# Patient Record
Sex: Male | Born: 1965
Health system: Southern US, Community
[De-identification: ages and names within clinical notes are randomized; demographics above are authoritative.]

## PROBLEM LIST (undated history)

## (undated) DIAGNOSIS — Z9289 Personal history of other medical treatment: Secondary | ICD-10-CM

## (undated) DIAGNOSIS — R110 Nausea: Secondary | ICD-10-CM

## (undated) DIAGNOSIS — R112 Nausea with vomiting, unspecified: Secondary | ICD-10-CM

## (undated) DIAGNOSIS — E7800 Pure hypercholesterolemia, unspecified: Secondary | ICD-10-CM

## (undated) DIAGNOSIS — K279 Peptic ulcer, site unspecified, unspecified as acute or chronic, without hemorrhage or perforation: Secondary | ICD-10-CM

## (undated) DIAGNOSIS — K635 Polyp of colon: Secondary | ICD-10-CM

## (undated) DIAGNOSIS — H8093 Unspecified otosclerosis, bilateral: Secondary | ICD-10-CM

## (undated) DIAGNOSIS — M549 Dorsalgia, unspecified: Secondary | ICD-10-CM

## (undated) DIAGNOSIS — N200 Calculus of kidney: Secondary | ICD-10-CM

## (undated) DIAGNOSIS — E785 Hyperlipidemia, unspecified: Secondary | ICD-10-CM

## (undated) DIAGNOSIS — J189 Pneumonia, unspecified organism: Secondary | ICD-10-CM

## (undated) DIAGNOSIS — Z789 Other specified health status: Secondary | ICD-10-CM

## (undated) DIAGNOSIS — Z9889 Other specified postprocedural states: Secondary | ICD-10-CM

## (undated) DIAGNOSIS — G4730 Sleep apnea, unspecified: Secondary | ICD-10-CM

## (undated) DIAGNOSIS — N183 Chronic kidney disease, stage 3 unspecified: Secondary | ICD-10-CM

## (undated) DIAGNOSIS — I10 Essential (primary) hypertension: Secondary | ICD-10-CM

## (undated) DIAGNOSIS — D649 Anemia, unspecified: Secondary | ICD-10-CM

## (undated) HISTORY — DX: Other specified health status: Z78.9

## (undated) HISTORY — DX: Calculus of kidney: N20.0

## (undated) HISTORY — PX: SMALL INTESTINE SURGERY: SHX150

## (undated) HISTORY — DX: Peptic ulcer, site unspecified, unspecified as acute or chronic, without hemorrhage or perforation: K27.9

## (undated) HISTORY — DX: Unspecified otosclerosis, bilateral: H80.93

## (undated) HISTORY — DX: Chronic kidney disease, stage 3 unspecified: N18.30

## (undated) HISTORY — DX: Personal history of other medical treatment: Z92.89

## (undated) HISTORY — DX: Polyp of colon: K63.5

## (undated) HISTORY — DX: Sleep apnea, unspecified: G47.30

---

## 1988-02-15 HISTORY — PX: OTHER SURGICAL HISTORY: SHX169

## 2003-02-15 HISTORY — PX: STAPEDOTOMY: SHX2437

## 2003-02-28 ENCOUNTER — Encounter (INDEPENDENT_AMBULATORY_CARE_PROVIDER_SITE_OTHER): Payer: Self-pay | Admitting: Specialist

## 2003-02-28 ENCOUNTER — Ambulatory Visit (HOSPITAL_BASED_OUTPATIENT_CLINIC_OR_DEPARTMENT_OTHER): Admission: RE | Admit: 2003-02-28 | Discharge: 2003-02-28 | Payer: Self-pay | Admitting: Otolaryngology

## 2003-02-28 ENCOUNTER — Ambulatory Visit (HOSPITAL_COMMUNITY): Admission: RE | Admit: 2003-02-28 | Discharge: 2003-02-28 | Payer: Self-pay | Admitting: Otolaryngology

## 2003-10-31 ENCOUNTER — Ambulatory Visit (HOSPITAL_BASED_OUTPATIENT_CLINIC_OR_DEPARTMENT_OTHER): Admission: RE | Admit: 2003-10-31 | Discharge: 2003-10-31 | Payer: Self-pay | Admitting: Otolaryngology

## 2003-10-31 ENCOUNTER — Encounter (INDEPENDENT_AMBULATORY_CARE_PROVIDER_SITE_OTHER): Payer: Self-pay | Admitting: Specialist

## 2003-10-31 ENCOUNTER — Ambulatory Visit (HOSPITAL_COMMUNITY): Admission: RE | Admit: 2003-10-31 | Discharge: 2003-10-31 | Payer: Self-pay | Admitting: Otolaryngology

## 2005-02-14 DIAGNOSIS — N200 Calculus of kidney: Secondary | ICD-10-CM

## 2005-02-14 HISTORY — DX: Calculus of kidney: N20.0

## 2006-01-24 ENCOUNTER — Ambulatory Visit (HOSPITAL_COMMUNITY): Admission: RE | Admit: 2006-01-24 | Discharge: 2006-01-24 | Payer: Self-pay | Admitting: Urology

## 2008-02-22 ENCOUNTER — Encounter: Admission: RE | Admit: 2008-02-22 | Discharge: 2008-02-22 | Payer: Self-pay | Admitting: Internal Medicine

## 2009-03-03 ENCOUNTER — Ambulatory Visit: Payer: Self-pay | Admitting: Psychology

## 2010-04-09 ENCOUNTER — Inpatient Hospital Stay (HOSPITAL_COMMUNITY)
Admission: EM | Admit: 2010-04-09 | Discharge: 2010-04-12 | DRG: 378 | Disposition: A | Discharge status: Home / Self Care | Payer: Managed Care, Other (non HMO) | Attending: Family Medicine | Admitting: Family Medicine

## 2010-04-09 ENCOUNTER — Emergency Department (HOSPITAL_COMMUNITY): Payer: Managed Care, Other (non HMO)

## 2010-04-09 DIAGNOSIS — I10 Essential (primary) hypertension: Secondary | ICD-10-CM

## 2010-04-09 DIAGNOSIS — E785 Hyperlipidemia, unspecified: Secondary | ICD-10-CM | POA: Diagnosis present

## 2010-04-09 DIAGNOSIS — K2970 Gastritis, unspecified, without bleeding: Secondary | ICD-10-CM | POA: Diagnosis present

## 2010-04-09 DIAGNOSIS — D62 Acute posthemorrhagic anemia: Secondary | ICD-10-CM | POA: Diagnosis present

## 2010-04-09 DIAGNOSIS — D126 Benign neoplasm of colon, unspecified: Secondary | ICD-10-CM | POA: Diagnosis present

## 2010-04-09 DIAGNOSIS — K260 Acute duodenal ulcer with hemorrhage: Secondary | ICD-10-CM

## 2010-04-09 DIAGNOSIS — K264 Chronic or unspecified duodenal ulcer with hemorrhage: Principal | ICD-10-CM | POA: Diagnosis present

## 2010-04-09 LAB — DIFFERENTIAL
Basophils Relative: 0 % (ref 0–1)
Eosinophils Absolute: 0.1 10*3/uL (ref 0.0–0.7)
Lymphs Abs: 3 10*3/uL (ref 0.7–4.0)
Neutro Abs: 5.9 10*3/uL (ref 1.7–7.7)

## 2010-04-09 LAB — COMPREHENSIVE METABOLIC PANEL
ALT: 17 U/L (ref 0–53)
AST: 16 U/L (ref 0–37)
Albumin: 3.1 g/dL — ABNORMAL LOW (ref 3.5–5.2)
CO2: 25 mEq/L (ref 19–32)
Chloride: 109 mEq/L (ref 96–112)
Creatinine, Ser: 1.27 mg/dL (ref 0.4–1.5)

## 2010-04-09 LAB — CBC
Hemoglobin: 8.2 g/dL — ABNORMAL LOW (ref 13.0–17.0)
MCH: 29.8 pg (ref 26.0–34.0)
MCHC: 35 g/dL (ref 30.0–36.0)
MCV: 85.1 fL (ref 78.0–100.0)
RDW: 13.7 % (ref 11.5–15.5)
WBC: 9.9 10*3/uL (ref 4.0–10.5)

## 2010-04-09 LAB — URINALYSIS, ROUTINE W REFLEX MICROSCOPIC
Ketones, ur: NEGATIVE mg/dL
Urobilinogen, UA: 0.2 mg/dL (ref 0.0–1.0)

## 2010-04-09 LAB — URINE MICROSCOPIC-ADD ON

## 2010-04-09 LAB — LIPASE, BLOOD: Lipase: 38 U/L (ref 11–59)

## 2010-04-09 LAB — OCCULT BLOOD, POC DEVICE: Fecal Occult Bld: POSITIVE

## 2010-04-09 MED ORDER — IOHEXOL 300 MG/ML  SOLN
80.0000 mL | Freq: Once | INTRAMUSCULAR | Status: AC | PRN
  Administered 2010-04-09: 80 mL via INTRAVENOUS

## 2010-04-10 ENCOUNTER — Encounter: Payer: Self-pay | Admitting: Family Medicine

## 2010-04-10 ENCOUNTER — Other Ambulatory Visit: Payer: Self-pay | Admitting: Gastroenterology

## 2010-04-10 DIAGNOSIS — E785 Hyperlipidemia, unspecified: Secondary | ICD-10-CM | POA: Diagnosis present

## 2010-04-10 DIAGNOSIS — I10 Essential (primary) hypertension: Secondary | ICD-10-CM | POA: Diagnosis present

## 2010-04-10 LAB — FOLATE: Folate: >20 ng/mL

## 2010-04-10 LAB — CBC
HCT: 22.2 % — ABNORMAL LOW (ref 39.0–52.0)
MCH: 29.2 pg (ref 26.0–34.0)
MCH: 29.2 pg (ref 26.0–34.0)
MCHC: 34.2 g/dL (ref 30.0–36.0)
MCV: 85.4 fL (ref 78.0–100.0)
RBC: 2.7 MIL/uL — ABNORMAL LOW (ref 4.22–5.81)
RDW: 14 % (ref 11.5–15.5)
RDW: 14.1 % (ref 11.5–15.5)
RDW: 14.4 % (ref 11.5–15.5)
WBC: 6.1 10*3/uL (ref 4.0–10.5)
WBC: 6.7 10*3/uL (ref 4.0–10.5)

## 2010-04-10 LAB — ABO/RH: ABO/RH(D): O POS

## 2010-04-10 LAB — BASIC METABOLIC PANEL
CO2: 25 mEq/L (ref 19–32)
Chloride: 111 mEq/L (ref 96–112)
GFR calc Af Amer: >60 mL/min (ref >60)
Glucose, Bld: 99 mg/dL (ref 70–99)
Potassium: 3.7 mEq/L (ref 3.5–5.1)

## 2010-04-10 LAB — IRON AND TIBC
Saturation Ratios: 14 % — ABNORMAL LOW (ref 20–55)
TIBC: 249 ug/dL (ref 215–435)
UIBC: 213 ug/dL

## 2010-04-10 LAB — VITAMIN B12: Vitamin B-12: 334 pg/mL (ref 211–911)

## 2010-04-10 LAB — TYPE AND SCREEN

## 2010-04-10 NOTE — H&P (Signed)
[facility] Admission History and Physical  Patient name: [name] Medical record number: [id number] Date of birth: 1965/04/16 Age: 45 y.o. Gender: male  Primary Care Provider: [place]  Chief Complaint: Fever, severe headache, black stools History of Present Illness: Black stools for about 5 days, fever and headache x 3 days.  Has chronic headaches, 2-3 times per month, this one is similar to  Has had fever at work, but does not know how high.  Was taken by nurse and told to come to hospital for "high temperature."   Has also noticed relatively sudden onset of black stools for past five days.  No changes in diet, no alcohol, no significant NSAIDs.  No n/v/d.  Does have a past history of stomach ulcers for which he has had surgery.  Has recently been given a medicine for headache but doesn't know what it is.  Patient Active Problem List  Diagnoses  . HTN (hypertension)  . HLD (hyperlipidemia)   Past Medical History: Past Medical History  Diagnosis Date  . Kidney stones 2007  . PUD (peptic ulcer disease)     Has had unspecified surgery for this    Past Surgical History: Past Surgical History  Procedure Date  . Surgery for ulcers 1990    Social History: History   Social History  . Marital Status: Single    Spouse Name: N/A    Number of Children: N/A  . Years of Education: N/A   Social History Main Topics  . Smoking status: Never Smoker   . Smokeless tobacco: Never Used  . Alcohol Use: No  . Drug Use: No  . Sexually Active: Yes   Other Topics Concern  . Not on file   Social History Narrative   Lives at home with wife and family.    Family History: Family History  Problem Relation Age of Onset  . Hyperlipidemia Father     Allergies: No Known Allergies  Pt reports having stopped his HCTZ and simvastatin about a month ago because they made his headaches worse.  No current facility-administered medications for this visit.    Current Outpatient Prescriptions  Medication Sig Dispense Refill  . DISCONTD: hydrochlorothiazide 25 MG tablet Take 25 mg by mouth daily.        Facility-Administered Medications Ordered in Other Visits  Medication Dose Route Frequency Provider Last Rate Last Dose  . iohexol (OMNIPAQUE) 300 MG/ML injection 80 mL  80 mL Intravenous Once PRN Medication Radiologist   80 mL at 04/09/10 2332   Review Of Systems: Per HPI with the following additions: Positive for fatigue, no n/v/d, no dysuria/hematuria, no constipation/diarrhea. Otherwise 12 point review of systems was performed and was unremarkable.  Physical Exam: Pulse: 79 Blood Pressure: 116/77 RR: 18  O2: 100%RA  Temp: 97.6 General: NAD, interactive and appropriate HEENT: PERRL, EOMI, pharynx non-erythematous, MMM Heart: regular rate, no murmurs Lungs: Normal respiratory effort. Lungs CTABL, no crackles or wheezes. Abdomen: SNTND, no guarding  Back: Anus has no obvious deformaties.  No gross blood, no external hemrhoids.  Rectal deferred. Extremities: negative edema, ROM grossly preserved Skin: No sores or suspicious lesions or rashes or color changes Neurology: Alert and oriented, CN II-XII grossly intact  Labs and Imaging: CBC: 9.9>8.2<159 BMET: 141/3.6/109/25/29/1.27<101 UA: moderate leukocyte esterase, moderate mbg, 30 protein Urine micro: 7-10 WBC, 11-20RBC, rare bacteria CXR:No acute cardiopulmonary disease. CT Ab/Pelvis: IMPRESSION:  1. No acute process in the abdomen or pelvis.  2. Similar configuration of massive right renal  enlargement,  favored to represents a markedly dilated collecting system  secondary to chronic UPJ obstruction. Stones are identified within  this hydronephrotic system.  3. Probable distal esophagitis.   Assessment and Plan: 45 year old male with GI bleed and headache and history of fever. 1. GI Bleed: As the patient has had black stools for about five days do not feel that bleed is  large-volume.  This is supported by the lack of diarrhea.  As the patient has a previous history of ulcers requiring surgery, would not be surprised if the cause of his heme-positive stools were upper GI.  Will monitor CBCs, place on high dose PPI, and monitor.  Will likely consult GI in the morning as the patient would likely benefit from upper endoscopy and, if nonrevealing, possibly a colonoscopy as well. 2. Headache: Will provide tylenol and tramadol for headache.  As this is a chronic issue and his current headache is similar to previous headaches do not feel that any additional workup is necessary. 3. Fever: Pt reports fever per nurse at work but does not know how high.  Will monitor closely and will culture the patient's urine (due to his chronic hydronephrosis) but, considering that the patient is afebrile at this time with no CVA tenderness or abdominal pain, do not fee there is any reason to start antibiotics. 4. FEN/GI: Clears for now, pending possible EGD in AM 5. Prophylaxis: SCDs, high dose PPI 6. Disposition: Pending improvement

## 2010-04-11 LAB — CBC
HCT: 21.4 % — ABNORMAL LOW (ref 39.0–52.0)
HCT: 24.4 % — ABNORMAL LOW (ref 39.0–52.0)
HCT: 24.7 % — ABNORMAL LOW (ref 39.0–52.0)
Hemoglobin: 8.1 g/dL — ABNORMAL LOW (ref 13.0–17.0)
Hemoglobin: 8.2 g/dL — ABNORMAL LOW (ref 13.0–17.0)
MCH: 29.6 pg (ref 26.0–34.0)
MCH: 28.8 pg (ref 26.0–34.0)
MCHC: 34.1 g/dL (ref 30.0–36.0)
MCV: 86.8 fL (ref 78.0–100.0)
Platelets: 134 10*3/uL — ABNORMAL LOW (ref 150–400)
RBC: 2.81 MIL/uL — ABNORMAL LOW (ref 4.22–5.81)
RBC: 2.82 MIL/uL — ABNORMAL LOW (ref 4.22–5.81)
WBC: 6.3 10*3/uL (ref 4.0–10.5)

## 2010-04-12 ENCOUNTER — Other Ambulatory Visit: Payer: Self-pay | Admitting: Gastroenterology

## 2010-04-12 LAB — DIFFERENTIAL
Basophils Relative: 0 % (ref 0–1)
Lymphocytes Relative: 32 % (ref 12–46)
Monocytes Absolute: 0.5 10*3/uL (ref 0.1–1.0)
Monocytes Relative: 9 % (ref 3–12)
Neutro Abs: 2.8 10*3/uL (ref 1.7–7.7)
Neutrophils Relative %: 56 % (ref 43–77)

## 2010-04-12 LAB — CBC
HCT: 22.4 % — ABNORMAL LOW (ref 39.0–52.0)
Hemoglobin: 7.5 g/dL — ABNORMAL LOW (ref 13.0–17.0)
MCH: 29.5 pg (ref 26.0–34.0)
MCHC: 33.5 g/dL (ref 30.0–36.0)
RBC: 2.54 MIL/uL — ABNORMAL LOW (ref 4.22–5.81)

## 2010-04-12 LAB — URINE CULTURE: Colony Count: 8000

## 2010-04-14 NOTE — Consult Note (Signed)
  NAMEIRISH, GONYO                  ACCOUNT NO.:  [id number]  MEDICAL RECORD NO.:  [id number]           PATIENT TYPE:  E  LOCATION:  [facility]                         FACILITY:  [facility]  PHYSICIAN:  Arta Silence, MD     DATE OF BIRTH:  Apr 05, 1965  DATE OF CONSULTATION:  04/10/2010 DATE OF DISCHARGE:                                CONSULTATION   REASON FOR CONSULTATION:  Melena.  CHIEF COMPLAINT:  Black stool.  HISTORY OF PRESENT ILLNESS:  Mr. Hull is a 45 year old emigrant from [place] who presents for evaluation of blood in the stool for the past 5 days, in the setting of some subjective fevers, he has had a couple of black bowel movements per day.  This has been complicated by some subjective fatigue and malaise.  He has no abdominal pain, hematemesis, dysphagia, odynophagia, change in bowel habits, loss of appetite or weight loss.  He denies having had a colonoscopy.  He did have some type of gastric procedure in the [place] for ulcer disease.  He does endorse taking large amounts of NSAIDs for his headaches.  Past medical history, past surgical history, medications, allergies, family history, and social history all from dictated note from Dr. Andria Frames dictated April 10, 2010.  I have reviewed and I agree.  PHYSICAL EXAMINATION:  VITAL SIGNS:  Blood pressure is 116/77, heart rate 79, respiratory rate 18, temperature 97.9, oxygen saturation 100% on room air. GENERAL:  Mr. Franchetti is in no acute distress. ENT:  Slightly dry mucous membranes.  He has no oropharyngeal lesions. NECK:  Supple without thyromegaly, lymphadenopathy, or bruits. SKIN:  No obvious rash or ecchymoses. LYMPHATICS:  No palpable axillary, submandibular, or supraclavicular adenopathy. NEUROLOGIC:  Diffusely weak, but nonfocal, without lateralizing signs. PSYCHIATRIC:  Normal mood and affect. HEART:  Regular rhythm, normal rate without murmur, rub, or gallop. LUNGS:  Clear to auscultation. ABDOMEN:  Soft,  nontender, nondistended.  Well-healed midline scar. EXTREMITIES:  No peripheral cyanosis, clubbing, or edema.  CT scan of the abdomen and pelvis noted possible esophagitis, chronic ureteropelvic junction obstruction with stones.  LABORATORY STUDIES:  Hemoglobin on admission was 7.6, white count 6.1, platelet count 137.  Sodium 142, potassium 3.7, chloride 111, bicarb 25, BUN 18, creatinine 1.0, total protein is 5.0, albumin 3.1, total bilirubin 0.3, alk phos 37, AST 16, ALT 17.  Iron studies show borderline iron deficiency, but normal ferritin, B12, and folate.  IMPRESSION:  Mr. Dollman is a 45 year old gentleman presenting for evaluation of melena and acute blood loss anemia.  He is hemodynamically stable.  He does use a lot of NSAIDs.  PLAN: 1. Agree with supportive management with IV fluids and PPI therapy,     and serial CBCs. 2. We will proceed with endoscopy.  Thanks again for allowing [place] to participate in Mr. Modisette's care.     Arta Silence, MD     WO/MEDQ  D:  04/10/2010  T:  04/10/2010  Job:  [id number]  Electronically Signed by Arta Silence  on 04/14/2010 05:43:28 PM

## 2010-04-26 NOTE — H&P (Signed)
NAMEMARQUAVION, [name]                  ACCOUNT NO.:  [id number]  MEDICAL RECORD NO.:  [id number]           PATIENT TYPE:  E  LOCATION:  [facility]                         FACILITY:  [facility]  PHYSICIAN:  Jamal Collin. Lawrence Roldan, M.D.DATE OF BIRTH:  02-Aug-1965  DATE OF ADMISSION:  04/09/2010 DATE OF DISCHARGE:                             HISTORY & PHYSICAL   PRIMARY CARE PHYSICIAN:  [facility].  CHIEF COMPLAINT:  Dark stools x5 days.  HISTORY OF PRESENT ILLNESS:  The patient speaks some English, but was interviewed with using an Arabic interpreter on the phone.  He is a 45- year-old male with 5-day history of black bowel movement.  He notes he has also been having a headache and has noted a fever for the past 2 days.  The patient has a history of chronic headaches and these are similar in nature although intensity is slightly worse.  The patient did not take a temperature and does not know his fever although he states that the nurse as his work took his temperature and he was told to go home because it was high.  Otherwise, the patient states he feels at his baseline health.  He denies abdominal pain, chest pain, shortness of breath, nausea, vomiting, diarrhea.  He states he takes a medication for headache, but does not seem to use large amount of NSAIDs.  PAST MEDICAL HISTORY: 1. Hyperlipidemia. 2. Hypertension. 3. Right nephrolithiasis and enlarged kidney.  PAST SURGICAL HISTORY:  The patient had surgery for gastric or for what sounds to be a gastrectomy for peptic ulcer disease in 1990.  SOCIAL HISTORY:  The patient lives with his wife and children.  He works in a [employer].  Denies tobacco, alcohol, or illicit drug use.  FAMILY HISTORY:  Her mother died of natural causes.  Father had hyperlipidemia.  REVIEW OF SYSTEMS:  Positive for fevers, fatigue, headache, melena. Negative for weight loss, sore throat, chest pain, cough, dyspnea, nausea, vomiting, diarrhea, dysphasia, bright  red blood per rectum, abdominal pain, bruising, bleeding, or weakness.  PHYSICAL EXAMINATION:  VITAL SIGNS:  Temp 97.9, pulse 79, respirations 18, blood pressure 116/77, pulse ox 100% on room air. GENERAL:  Alert and oriented, in no acute distress. HEENT:  Oral mucosa moist. NECK:  No lymphadenopathy. CARDIOVASCULAR:  Regular rate and rhythm.  No murmurs, rubs, or gallops. LUNGS:  Clear to auscultation bilaterally. ABDOMEN:  Well-healed midline scar.  Right abdomen full, no tenderness to palpation. BACK:  No flank pain. RECTAL:  Normal per exam by Dr. Denice Paradise. EXTREMITIES:  No lower extremity edema. NEUROLOGIC:  Nonfocal.  No meningeal signs.  LABS AND STUDIES: 1. CT abdomen and pelvis, no acute mass, right renal enlargement     secondary to chronic UPJ obstruction with stones, probable     esophagitis. 2. Chest x-ray, no acute cardiopulmonary disease. 3. CBC; white blood cell count 9.9, hemoglobin 8.2, platelets 159. 4. INR 0.96. 5. BMET; sodium 141, potassium 3.6, chloride 109, bicarbonate 25, BUN     29, creatinine 1.27.  Platelets 101.  Bilirubin 0.3, alk phos 37,     AST 16, ALT  17, protein 5, albumin 3.1, calcium 8.3. 6. Lipase 38. 7. Urinalysis; hazy, moderate blood, 30 protein, moderate leukocytes.     Microscopy, rbc's 11-20, wbc's 7-10. 8. Fecal occult blood positive.  ASSESSMENT AND PLAN:  This is a 45 year old with melena and anemia.  1. Anemia, likely due to upper gastrointestinal losses with history of     peptic ulcer disease and esophagitis noted on CT abdomen.     Otherwise, the patient is asymptomatic.  We will recheck CBC.  The     patient is Hemoccult positive.  We will consult GI in the morning     for possible endoscopy. 2. Nephrolithiasis.  The patient has a history of renal stone with     hydronephrosis.  No acute changes.  The patient has a urine culture     pending. 3. Headache.  The patient has a history of chronic headaches, treated     with Tylenol  and tramadol as needed. 4. Fever.  The patient's fever is not documented here.  No obvious     source of fever with normal white blood cell count.  We will follow     urine cultures. 5. Fluids, electrolytes, and nutrition/gastrointestinal.  The patient     is to be on clears until endoscopy. 6. Prophylaxis.  Sequential compression devices. 7. Disposition, pending workup.     Suzanna Obey, MD   ______________________________ Jamal Collin Andria Frames, M.D.    Gretel Acre  D:  04/10/2010  T:  04/10/2010  Job:  [id number]  Electronically Signed by Suzanna Obey MD on 04/19/2010 11:02:27 PM Electronically Signed by Madison Hickman M.D. on 04/26/2010 09:10:01 AM

## 2010-05-03 NOTE — Discharge Summary (Signed)
[name], [name]                  ACCOUNT NO.:  [id number]  MEDICAL RECORD NO.:  [id number]           PATIENT TYPE:  E  LOCATION:  [facility]                         FACILITY:  [facility]  PHYSICIAN:  Jamal Collin. Hensel, M.D.DATE OF BIRTH:  08-28-1965  DATE OF ADMISSION:  04/09/2010 DATE OF DISCHARGE:  04/12/2010                              DISCHARGE SUMMARY   PRIMARY CARE PROVIDER:  [facility].  DISCHARGE DIAGNOSES: 1. Gastrointestinal bleed. 2. Duodenal ulcer. 3. History of hypertension. 4. Hyperlipidemia.  DISCHARGE MEDICATIONS: 1. Tylenol 650 mg by mouth every 6 hours as needed. 2. Elavil 25 mg by mouth daily. 3. Iron complex 150 mg by mouth daily for 30 days. 4. Protonix 40 mg by mouth twice daily for 6-8 weeks.  CONSULTS:  Gastroenterology - Arta Silence, MD  LABORATORY DATA:  Hemoglobin on the date of admission was 8.2, INR was 0.96, BUN was mildly elevated at 29, creatinine was 1.27, lipase was 38. Urinalysis demonstrated moderate leukocytes, but negative nitrites. Fecal occult blood was positive.  Iron panel demonstrated iron level of 36, TIBC of 249 with a percent saturation of 14%.  B12 was 334, folate was greater than 20, and ferritin of 67.  On the date of discharge, the patient's hemoglobin was 7.5 and had been within the 7.5-8.2 range throughout the patient's entire admission.  PROCEDURES: 1. Upper endoscopy performed on April 10, 2010 demonstrated a clean-     based duodenal bulbar ulcer, anatomic distortion at the junction of     the bulb and C-loop suggesting prior surgical intervention. 2. Colonoscopy performed on April 12 2010 demonstrated three     diminutive polyps, and otherwise normal colon and terminal ileum.  IMAGING: 1. Chest x-ray performed on April 09, 2010 demonstrated no acute     cardiopulmonary disease. 2. CT of the abdomen and pelvis with contrast performed on April 09, 2010 demonstrated massive right renal enlargement  most likely     representing markedly dilated collecting system secondary to     chronic UPJ obstruction and probable distal esophagitis.  BRIEF HOSPITAL COURSE:  [name] is a 45 year old male, who presented with 5 days of dark stools. 1. Melena:  The patient was admitted, observed closely, and GI was     consulted.  The patient's hemoglobin remained stable, it was not     felt to be necessary to transfuse the patient.  The patient did not     demonstrate any signs of continued active bleeding.  Endoscopy was     performed with results as above.  This was followed shortly there     after by a colonoscopy with results as previously noted.  Per     Gastroenterology's recommendation, the patient was started on high-     dose PPI and oral iron supplements with instructions to avoid     NSAIDs and followup with GI on May 11, 2010. 2. History of hypertension:  The patient presented with a history of     hypertension, but had not been taking any of his antihypertensives     in  sometime.  The patient remained normotensive throughout his     hospitalization. 3. Right renal enlargement:  The patient's right kidney is chronically     enlarged.  The patient expressed that this was a known problem, and     CT of the abdomen and pelvis was relatively unchanged from 2010.     Urine culture was obtained at the time of admission and     demonstrated insignificant growth.  DISCHARGE INSTRUCTIONS:  The patient was discharged home with no activity restrictions, no dietary restrictions, instructions to avoid NSAIDs and alcohol.  FOLLOWUP APPOINTMENTS: 1. Arta Silence, MD at [place] on May 11, 2010 at 1 p.m. [facility], the patient was instructed to come in for a     walk-in appointment in approximately 1 week.  DISCHARGE CONDITION:  The patient was discharged home in stable medical condition.    ______________________________ Vickie Epley,  MD   ______________________________ Jamal Collin. Andria Frames, M.D.    ER/MEDQ  D:  04/19/2010  T:  04/20/2010  Job:  [id number]  Electronically Signed by Andree Moro MD on 05/02/2010 07:33:54 PM Electronically Signed by Madison Hickman M.D. on 05/03/2010 02:09:59 PM

## 2010-06-03 ENCOUNTER — Other Ambulatory Visit: Payer: Self-pay | Admitting: Gastroenterology

## 2010-06-03 DIAGNOSIS — R110 Nausea: Secondary | ICD-10-CM

## 2010-06-03 DIAGNOSIS — R198 Other specified symptoms and signs involving the digestive system and abdomen: Secondary | ICD-10-CM

## 2010-06-07 ENCOUNTER — Ambulatory Visit
Admission: RE | Admit: 2010-06-07 | Discharge: 2010-06-07 | Disposition: A | Discharge status: Home / Self Care | Payer: Managed Care, Other (non HMO) | Source: Ambulatory Visit | Attending: Gastroenterology | Admitting: Gastroenterology

## 2010-06-07 DIAGNOSIS — R198 Other specified symptoms and signs involving the digestive system and abdomen: Secondary | ICD-10-CM

## 2010-06-07 DIAGNOSIS — R110 Nausea: Secondary | ICD-10-CM

## 2010-06-25 ENCOUNTER — Other Ambulatory Visit (HOSPITAL_COMMUNITY): Payer: Self-pay | Admitting: Urology

## 2010-06-25 DIAGNOSIS — N1330 Unspecified hydronephrosis: Secondary | ICD-10-CM

## 2010-07-02 NOTE — Op Note (Signed)
NAMEKENRY, REEDE                              ACCOUNT NO.:  [id number]   MEDICAL RECORD NO.:  [id number]                   PATIENT TYPE:  AMB   LOCATION:  [place]                                  FACILITY:  [facility]   PHYSICIAN:  Christopher E. Lucia Gaskins, M.D.         DATE OF BIRTH:  Feb 11, 1966   DATE OF PROCEDURE:  02/28/2003  DATE OF DISCHARGE:                                 OPERATIVE REPORT   PREOPERATIVE DIAGNOSIS:  Otosclerosis with bilateral conductive hearing  loss.   POSTOPERATIVE DIAGNOSIS:  Otosclerosis with bilateral conductive hearing  loss.   OPERATION:  Left stapedotomy (4.5 mm x 0.5 mm piston prosthesis).   SURGEON:  Leonides Sake. Lucia Gaskins, M.D.   ANESTHESIA:  General endotracheal.   COMPLICATIONS:  None.   DESCRIPTION OF PROCEDURE:  After adequate endotracheal anesthesia, the  patient's left ear was prepped with Betadine solution and draped with  sterile towels.  The ear canal was then injected with Xylocaine with  epinephrine for hemostasis.  A posterior-based tympanomeatal flap was  created with a Beaver blade and sickle knife.  A flap was elevated down to  the annulus, at which time cotton pledgets soaked in adrenaline were placed  for hemostasis.  The annulus was then elevated, and the middle ear space was  entered.  The middle ear space was dry.  The patient had otosclerotic stapes  superstructure with essentially no mobility of the stapes superstructure.  The incus and the malleus seemed to have reasonable mobility.  The stapedial  tendon was then cut.  The highest joint was then separated initially with  the curved Rosen needle.  I could not fracture the superstructure because of  the density of the bone and adhesion of the superstructure.  The posterior  crus was cut with a Skeeter drill using the 0.6 mm diamond bur.  After  cutting the posterior crus, I was able to fracture the superstructure and  remove it.  In order to get adequate visualization, there was  some bone  curetted in order to visualize the stapedial tendon, as well as the facial  nerve.  The chorda tympani was partially obstructing, but this was left  intact.  Measurements were made from the oval window to the long process of  the incus.  This measured 4.5 mm and could have used either a 4.25 length  prosthesis versus a 4.5 prosthesis.  It was elected to go ahead and use the  longer prosthesis and a 4.5 piston Teflon prosthesis was utilized with a 0.5  mm diameter.  Using the [facility] drill with a 0.6 mm diamond bur, fenestra  was made in the oval window footplate.  The initial hole was a little small,  and it was drilled a little bit larger the second time.  The 4.5 mm length  prosthesis was then placed in the hole, hooked over the incus, and was  crimped on the incus.  The prosthesis seemed to have reasonable mobility.  It seemed to be in good position.  Some blood accumulated around the oval  window footplate around the prosthesis with a good fit.  The tympanomeatal  flap was brought back down.  The eardrum was intact.  The ear canal medially  was packed with Gelfoam soaked in Cipro Dex, and then the lateral portion of  the ear canal was filled with Bacitracin ointment, followed by a cotton ball  and a Band-Aid over the ear.  The patient was awakened from anesthesia and  transferred to the recovery room postoperatively doing well.   DISPOSITION:  Akiel will be observed in the recovery care center overnight  and possibly discharged this evening if he is not having any significant  dizziness, nausea, or vomiting.  We will plan to have him follow up in my  office in one week for recheck.   DISCHARGE MEDICATIONS:  Include Tylenol #3 p.r.n. pain and Keflex 500 mg  b.i.d. for 5 days.                                               Leonides Sake. Lucia Gaskins, M.D.    CEN/MEDQ  D:  02/28/2003  T:  02/28/2003  Job:  [id number]

## 2010-07-02 NOTE — Op Note (Signed)
[name], [name]                              ACCOUNT NO.:  [id number]   MEDICAL RECORD NO.:  [id number]                   PATIENT TYPE:  AMB   LOCATION:  [place]                                  FACILITY:  [facility]   PHYSICIAN:  Christopher E. Lucia Gaskins, M.D.         DATE OF BIRTH:  [birth year]   DATE OF PROCEDURE:  10/31/2003  DATE OF DISCHARGE:                                 OPERATIVE REPORT   PREOPERATIVE DIAGNOSIS:  Right ear otosclerosis with 40 decibel conductive  hearing loss.   POSTOPERATIVE DIAGNOSIS:  Right ear otosclerosis with 40 decibel conductive  hearing loss.   OPERATION:  Right stapedotomy (Richards piston prosthesis 4.25 mm length and  0.4 mm diameter).   SURGEON:  Leonides Sake. Lucia Gaskins, M.D.   ANESTHESIA:  General endotracheal.   COMPLICATIONS:  None.   BRIEF CLINICAL NOTE:  [name] is a 45 year old gentleman with history of  otosclerosis, status post a left stapedotomy seven months ago.  He has done  well with the left ear.  He is taken to the operating room at this time for  a right stapedotomy.   DESCRIPTION OF PROCEDURE:  After adequate endotracheal anesthesia, the  patient received 1 g of Ancef IV preoperatively.  The ear was prepped with  Betadine solution.  The ear was then cleaned and irrigated with saline.  The  ear canal was injected with Xylocaine with epinephrine for hemostasis.  A  posterior-based tympanomeatal flap was elevated down to the annulus.  Cotton  pledgets soaked in Adrenalin were placed for hemostasis.  The annulus was  then elevated and the middle ear space entered.  The middle ear space was  dry.  The patient had a frozen stapes secondary to otosclerosis.  The  footplate was very thick.  Measurements were made from the footplate to the  incus.  This measured just under 4.5 mm length.  It was elected to use a  4.25-mm length Richards piston prosthesis.  The stapedial tendon was  transected.  The incus was separated from the stapes  superstructure, and  then the stapes superstructure was fractured toward the promontory and  removed.  Using the [facility] drill with a 0.6-mm Diamond bur, a small  fenestra was made in the oval window.  A Richards 4.25-mm length 0.4-mm  diameter piston prosthesis was selected, was placed within the stapedotomy  and hooked onto the incus.  The teflon prosthesis was then crimped on the  incus and found to have good mobility.  Some of the mucosa over the  promontory was then scraped to allow a small blood clot to form around the  oval window and the prosthesis.  The tympanomeatal flap was brought back  down, and the ear canal was packed with four pieces of Gelfoam soaked in  Colimycin, and the ear canal was then filled with bacitracin ointment.  A  cotton ball and Band-Aid were applied to the ear.  The patient was awoken  from anesthesia and transferred to the recovery room postoperatively doing  well.   DISPOSITION:  [name] will be observed overnight in the Recovery Care Unit and  discharged home in the morning on Keflex 500 b.i.d. for five days and  Tylenol No. 3 p.r.n. pain.  I will have him follow up in my office in one  week for recheck.      CEN/MEDQ  D:  10/31/2003  T:  11/01/2003  Job:  [id number]

## 2010-07-09 ENCOUNTER — Ambulatory Visit (HOSPITAL_COMMUNITY)
Admission: RE | Admit: 2010-07-09 | Discharge: 2010-07-09 | Disposition: A | Discharge status: Home / Self Care | Payer: Managed Care, Other (non HMO) | Source: Ambulatory Visit | Attending: Urology | Admitting: Urology

## 2010-07-09 ENCOUNTER — Other Ambulatory Visit (HOSPITAL_COMMUNITY): Payer: Self-pay | Admitting: Urology

## 2010-07-09 ENCOUNTER — Ambulatory Visit (HOSPITAL_COMMUNITY): Admission: RE | Admit: 2010-07-09 | Payer: Self-pay | Source: Ambulatory Visit

## 2010-07-09 DIAGNOSIS — N200 Calculus of kidney: Secondary | ICD-10-CM | POA: Insufficient documentation

## 2010-07-09 DIAGNOSIS — N1330 Unspecified hydronephrosis: Secondary | ICD-10-CM

## 2010-07-09 DIAGNOSIS — I10 Essential (primary) hypertension: Secondary | ICD-10-CM | POA: Insufficient documentation

## 2010-07-09 DIAGNOSIS — N135 Crossing vessel and stricture of ureter without hydronephrosis: Secondary | ICD-10-CM | POA: Insufficient documentation

## 2010-07-09 MED ORDER — TECHNETIUM TC 99M MERTIATIDE
15.0000 | Freq: Once | INTRAVENOUS | Status: AC | PRN
  Administered 2010-07-09: 15 via INTRAVENOUS

## 2010-09-02 ENCOUNTER — Other Ambulatory Visit: Payer: Self-pay | Admitting: Gastroenterology

## 2011-01-16 ENCOUNTER — Emergency Department (HOSPITAL_COMMUNITY): Payer: Managed Care, Other (non HMO)

## 2011-01-16 ENCOUNTER — Emergency Department (HOSPITAL_COMMUNITY)
Admission: EM | Admit: 2011-01-16 | Discharge: 2011-01-16 | Disposition: A | Discharge status: Home / Self Care | Payer: Managed Care, Other (non HMO) | Attending: Emergency Medicine | Admitting: Emergency Medicine

## 2011-01-16 ENCOUNTER — Encounter (HOSPITAL_COMMUNITY): Payer: Self-pay | Admitting: *Deleted

## 2011-01-16 DIAGNOSIS — R070 Pain in throat: Secondary | ICD-10-CM | POA: Insufficient documentation

## 2011-01-16 DIAGNOSIS — I10 Essential (primary) hypertension: Secondary | ICD-10-CM | POA: Insufficient documentation

## 2011-01-16 DIAGNOSIS — B9789 Other viral agents as the cause of diseases classified elsewhere: Secondary | ICD-10-CM | POA: Insufficient documentation

## 2011-01-16 DIAGNOSIS — R6889 Other general symptoms and signs: Secondary | ICD-10-CM | POA: Insufficient documentation

## 2011-01-16 DIAGNOSIS — E785 Hyperlipidemia, unspecified: Secondary | ICD-10-CM | POA: Insufficient documentation

## 2011-01-16 DIAGNOSIS — Z8711 Personal history of peptic ulcer disease: Secondary | ICD-10-CM | POA: Insufficient documentation

## 2011-01-16 DIAGNOSIS — IMO0001 Reserved for inherently not codable concepts without codable children: Secondary | ICD-10-CM | POA: Insufficient documentation

## 2011-01-16 DIAGNOSIS — R109 Unspecified abdominal pain: Secondary | ICD-10-CM | POA: Insufficient documentation

## 2011-01-16 DIAGNOSIS — R509 Fever, unspecified: Secondary | ICD-10-CM | POA: Insufficient documentation

## 2011-01-16 DIAGNOSIS — Z79899 Other long term (current) drug therapy: Secondary | ICD-10-CM | POA: Insufficient documentation

## 2011-01-16 DIAGNOSIS — B349 Viral infection, unspecified: Secondary | ICD-10-CM

## 2011-01-16 DIAGNOSIS — R05 Cough: Secondary | ICD-10-CM | POA: Insufficient documentation

## 2011-01-16 DIAGNOSIS — R059 Cough, unspecified: Secondary | ICD-10-CM | POA: Insufficient documentation

## 2011-01-16 DIAGNOSIS — N200 Calculus of kidney: Secondary | ICD-10-CM

## 2011-01-16 HISTORY — DX: Essential (primary) hypertension: I10

## 2011-01-16 HISTORY — DX: Hyperlipidemia, unspecified: E78.5

## 2011-01-16 LAB — DIFFERENTIAL
Basophils Absolute: 0 10*3/uL (ref 0.0–0.1)
Basophils Relative: 0 % (ref 0–1)
Eosinophils Absolute: 0.1 10*3/uL (ref 0.0–0.7)
Neutro Abs: 6 10*3/uL (ref 1.7–7.7)
Neutrophils Relative %: 67 % (ref 43–77)

## 2011-01-16 LAB — URINALYSIS, ROUTINE W REFLEX MICROSCOPIC
Bilirubin Urine: NEGATIVE
Glucose, UA: NEGATIVE mg/dL
Ketones, ur: NEGATIVE mg/dL
pH: 5.5 (ref 5.0–8.0)

## 2011-01-16 LAB — COMPREHENSIVE METABOLIC PANEL
ALT: 14 U/L (ref 0–53)
AST: 17 U/L (ref 0–37)
Albumin: 3.7 g/dL (ref 3.5–5.2)
Alkaline Phosphatase: 67 U/L (ref 39–117)
Chloride: 101 mEq/L (ref 96–112)
Potassium: 4.1 mEq/L (ref 3.5–5.1)
Sodium: 135 mEq/L (ref 135–145)
Total Bilirubin: 0.5 mg/dL (ref 0.3–1.2)
Total Protein: 7.5 g/dL (ref 6.0–8.3)

## 2011-01-16 LAB — CBC
Hemoglobin: 17.4 g/dL — ABNORMAL HIGH (ref 13.0–17.0)
MCH: 26.9 pg (ref 26.0–34.0)
MCHC: 33.8 g/dL (ref 30.0–36.0)
Platelets: 167 10*3/uL (ref 150–400)

## 2011-01-16 LAB — URINE MICROSCOPIC-ADD ON

## 2011-01-16 MED ORDER — ONDANSETRON HCL 4 MG/2ML IJ SOLN
4.0000 mg | Freq: Once | INTRAMUSCULAR | Status: AC
  Administered 2011-01-16: 4 mg via INTRAVENOUS
  Filled 2011-01-16: qty 2

## 2011-01-16 MED ORDER — ACETAMINOPHEN-CODEINE #3 300-30 MG PO TABS: 1.0000 | ORAL_TABLET | Freq: Four times a day (QID) | ORAL | Status: AC | PRN

## 2011-01-16 MED ORDER — SODIUM CHLORIDE 0.9 % IV BOLUS (SEPSIS)
1000.0000 mL | Freq: Once | INTRAVENOUS | Status: AC
  Administered 2011-01-16: 1000 mL via INTRAVENOUS

## 2011-01-16 MED ORDER — KETOROLAC TROMETHAMINE 30 MG/ML IJ SOLN
30.0000 mg | Freq: Once | INTRAMUSCULAR | Status: AC
  Administered 2011-01-16: 30 mg via INTRAVENOUS
  Filled 2011-01-16: qty 1

## 2011-01-16 MED ORDER — FENTANYL CITRATE 0.05 MG/ML IJ SOLN
50.0000 ug | Freq: Once | INTRAMUSCULAR | Status: AC
  Administered 2011-01-16: 100 ug via INTRAVENOUS
  Filled 2011-01-16: qty 2

## 2011-01-16 NOTE — ED Provider Notes (Signed)
History     CSN: [id number] Arrival date & time: 01/16/2011  4:53 AM   First MD Initiated Contact with Patient 01/16/11 [phone]      Chief Complaint  Patient presents with  . Flank Pain    Pt also reports general aching , productive cough, feels weak . Pt also reports his RT kidney  does not work.    (Consider location/radiation/quality/duration/timing/severity/associated sxs/prior treatment) HPI  Patient presents to ER complaining of a day day hx of gradual onset cough, runny nose and sore throat as well has fevers stating that he saw his PCP as was dx with flu and prescribed Tamiflu but despite taking the medication as well as at home tylenol he has been having persistent cough, body aches, runny nose stating that at 11pm last night he began to have right lower side pain when coughing. Denies abdominal pain, n/v/d, hematuria or dysuria. He denies aggravating or alleviating factors. Symptoms were gradual onset, persistent and unchanging. Denies difficulty breathing, wheezing.   Past Medical History  Diagnosis Date  . Kidney stones 2007  . PUD (peptic ulcer disease)     Has had unspecified surgery for this  . Hypertension   . Hyperlipidemia     Past Surgical History  Procedure Date  . Surgery for ulcers 1990    Family History  Problem Relation Age of Onset  . Hyperlipidemia Father     History  Substance Use Topics  . Smoking status: Never Smoker   . Smokeless tobacco: Never Used  . Alcohol Use: No      Review of Systems  All other systems reviewed and are negative.    Allergies  Review of patient's allergies indicates no known allergies.  Home Medications   Current Outpatient Rx  Name Route Sig Dispense Refill  . OSELTAMIVIR PHOSPHATE 75 MG PO CAPS Oral Take 75 mg by mouth 2 (two) times daily. Filled on 01-12-11     . PHENOL 1.4 % MT LIQD Mouth/Throat Use as directed 1 spray in the mouth or throat as needed. For sore throat     . SALINE NASAL SPRAY 0.65 % NA  SOLN Nasal Place 1 spray into the nose as needed. For stuffy nose       BP 156/97  Pulse 81  Temp(Src) 97.9 F (36.6 C) (Oral)  Resp 18  SpO2 98%  Physical Exam  Nursing note and vitals reviewed. Constitutional: He is oriented to person, place, and time. He appears well-developed and well-nourished. No distress.  HENT:  Head: Normocephalic and atraumatic.  Mouth/Throat: Oropharynx is clear and moist.       Mild posterior pharynx erythema but no exudate or tonsillar enlargement.   Eyes: Conjunctivae and EOM are normal. Pupils are equal, round, and reactive to light.  Neck: Normal range of motion. Neck supple.  Cardiovascular: Normal rate, regular rhythm, normal heart sounds and intact distal pulses.  Exam reveals no gallop and no friction rub.   No murmur heard. Pulmonary/Chest: Effort normal and breath sounds normal. No respiratory distress. He has no wheezes. He has no rales. He exhibits no tenderness.  Abdominal: Bowel sounds are normal. He exhibits no distension and no mass. There is tenderness. There is no rebound and no guarding.       Mild TTP of right lateral lower flank but no peritoneal signs of abdomen.   Musculoskeletal: Normal range of motion. He exhibits no edema and no tenderness.  Lymphadenopathy:    He has no cervical adenopathy.  Neurological:  He is alert and oriented to person, place, and time.  Skin: Skin is warm and dry. No rash noted. He is not diaphoretic. No erythema.  Psychiatric: He has a normal mood and affect.    ED Course  Procedures (including critical care time)  Labs Reviewed  CBC - Abnormal; Notable for the following:    RBC 6.46 (*)    Hemoglobin 17.4 (*)    RDW 16.0 (*)    All other components within normal limits  COMPREHENSIVE METABOLIC PANEL - Abnormal; Notable for the following:    Glucose, Bld 104 (*)    GFR calc non Af Amer 81 (*)    All other components within normal limits  URINALYSIS, ROUTINE W REFLEX MICROSCOPIC - Abnormal;  Notable for the following:    APPearance CLOUDY (*)    Hgb urine dipstick MODERATE (*)    Protein, ur 100 (*)    Leukocytes, UA TRACE (*)    All other components within normal limits  URINE MICROSCOPIC-ADD ON - Abnormal; Notable for the following:    Bacteria, UA FEW (*)    Casts HYALINE CASTS (*)    All other components within normal limits  DIFFERENTIAL   Ct Abdomen Pelvis Wo Contrast  01/16/2011  *RADIOLOGY REPORT*  Clinical Data: Right flank pain, history of chronic right UPJ obstruction with right nephrolithiasis  CT ABDOMEN AND PELVIS WITHOUT CONTRAST  Technique:  Multidetector CT imaging of the abdomen and pelvis was performed following the standard protocol without intravenous contrast.  Comparison: 07/09/2010, 06/07/2010, 04/09/2010  Findings: Minimal basilar atelectasis.  Normal heart size.  No pericardial or pleural effusion.  No hiatal hernia.  Abdomen:  Similar massive right renal cystic enlargement with associated right nephrolithiasis.  Stable right UPJ calculus. Pattern compatible with a chronic right UPJ obstruction.  Left kidney demonstrates no acute hydronephrosis, obstructive uropathy or obstructing urinary tract calculus.  Left ureter is decompressed.  No left ureteral calculus.  Liver, gallbladder, biliary system, pancreas, spleen, accessory splenule, and adrenal glands are stable and demonstrate no acute process by noncontrast imaging.  No bowel obstruction, dilatation, ileus, or free air.  Normal retrocecal appendix identified.  Pelvis:  No pelvic free fluid, fluid collection, distal ureteral calculus, or UVJ abnormality.  Normal urinary bladder.  No acute distal bowel process, pelvic free fluid, fluid collection, hemorrhage, adenopathy, or inguinal abnormality.  No acute abnormal osseous finding.  Degenerative disc disease at L5 S1.  IMPRESSION: No acute intra-abdominal or pelvic process by noncontrast CT.  Similar configuration of the cystic right renal enlargement with  associated nephrolithiasis compatible with a chronic right UPJ obstruction.  Original Report Authenticated By: Jerilynn Mages. Daryll Brod, M.D.   Dg Chest 2 View  01/16/2011  *RADIOLOGY REPORT*  Clinical Data: Cough, flu-like symptoms  CHEST - 2 VIEW  Comparison:  04/09/2010  Findings:  The heart size and mediastinal contours are within normal limits.  Both lungs are clear.  The visualized skeletal structures are unremarkable.  IMPRESSION: No active cardiopulmonary disease.  Original Report Authenticated By: Jerilynn Mages. Daryll Brod, M.D.     1. Viral syndrome   2. Kidney stone       MDM  Chronic but stable right UPJ calculus and ongoing renal cysts but normal creatinine. No other acute findings on ct abdomen/pelvis. Patient is afebrile and non toxic appearing with normal chest xray. Will send patient with tylenol #3 for cough and pain and have him follow up with PCP and urology for ongoing management.  Eben Burow, [place] 01/16/11 [phone]

## 2011-01-16 NOTE — ED Provider Notes (Signed)
History     CSN: [id number] Arrival date & time: 01/16/2011  4:53 AM   None     Chief Complaint  Patient presents with  . Flank Pain    Pt also reports general aching , productive cough, feels weak . Pt also reports his RT kidney  does not work.    (Consider location/radiation/quality/duration/timing/severity/associated sxs/prior treatment) HPI  Past Medical History  Diagnosis Date  . Kidney stones 2007  . PUD (peptic ulcer disease)     Has had unspecified surgery for this  . Hypertension   . Hyperlipidemia     Past Surgical History  Procedure Date  . Surgery for ulcers 1990    Family History  Problem Relation Age of Onset  . Hyperlipidemia Father     History  Substance Use Topics  . Smoking status: Never Smoker   . Smokeless tobacco: Never Used  . Alcohol Use: No      Review of Systems  Allergies  Review of patient's allergies indicates no known allergies.  Home Medications   Current Outpatient Rx  Name Route Sig Dispense Refill  . OSELTAMIVIR PHOSPHATE 75 MG PO CAPS Oral Take 75 mg by mouth 2 (two) times daily. Filled on 01-12-11     . PHENOL 1.4 % MT LIQD Mouth/Throat Use as directed 1 spray in the mouth or throat as needed. For sore throat     . SALINE NASAL SPRAY 0.65 % NA SOLN Nasal Place 1 spray into the nose as needed. For stuffy nose       BP 156/97  Pulse 81  Temp(Src) 97.9 F (36.6 C) (Oral)  Resp 18  SpO2 98%  Physical Exam  ED Course  Procedures (including critical care time)  Labs Reviewed - No data to display No results found.   No diagnosis found.    MDM  Duplicate note        Orlie Dakin, MD 01/16/11 1558

## 2011-01-16 NOTE — ED Provider Notes (Signed)
Medical screening examination/treatment/procedure(s) were performed by non-physician practitioner and as supervising physician I was immediately available for consultation/collaboration.  Orlie Dakin, MD 01/16/11 1556

## 2011-01-16 NOTE — ED Notes (Signed)
Patient presents with c/o right flank pain that radiates around to the front.  Abd distended and stated that it is gas.  Is currently being treated with Tamiflu.  Lips dry, skin moist to touch +bowel sounds noted

## 2011-01-16 NOTE — ED Notes (Signed)
PT was seen by PCP on WED. But pt reports he feels worse.

## 2011-01-20 ENCOUNTER — Other Ambulatory Visit: Payer: Self-pay | Admitting: Urology

## 2011-02-14 ENCOUNTER — Encounter (HOSPITAL_COMMUNITY): Payer: Self-pay | Admitting: Pharmacy Technician

## 2011-02-14 ENCOUNTER — Encounter (HOSPITAL_COMMUNITY): Payer: Self-pay

## 2011-02-14 ENCOUNTER — Ambulatory Visit (HOSPITAL_COMMUNITY)
Admission: RE | Admit: 2011-02-14 | Discharge: 2011-02-14 | Disposition: A | Discharge status: Home / Self Care | Payer: Managed Care, Other (non HMO) | Source: Ambulatory Visit | Attending: Urology | Admitting: Urology

## 2011-02-14 ENCOUNTER — Encounter (HOSPITAL_COMMUNITY)
Admission: RE | Admit: 2011-02-14 | Discharge: 2011-02-14 | Disposition: A | Discharge status: Home / Self Care | Payer: Managed Care, Other (non HMO) | Source: Ambulatory Visit | Attending: Urology | Admitting: Urology

## 2011-02-14 ENCOUNTER — Other Ambulatory Visit: Payer: Self-pay

## 2011-02-14 DIAGNOSIS — Z01812 Encounter for preprocedural laboratory examination: Secondary | ICD-10-CM | POA: Insufficient documentation

## 2011-02-14 DIAGNOSIS — Z01818 Encounter for other preprocedural examination: Secondary | ICD-10-CM | POA: Insufficient documentation

## 2011-02-14 DIAGNOSIS — Z01810 Encounter for preprocedural cardiovascular examination: Secondary | ICD-10-CM | POA: Insufficient documentation

## 2011-02-14 HISTORY — DX: Anemia, unspecified: D64.9

## 2011-02-14 HISTORY — DX: Pure hypercholesterolemia, unspecified: E78.00

## 2011-02-14 LAB — COMPREHENSIVE METABOLIC PANEL
AST: 15 U/L (ref 0–37)
Albumin: 3.6 g/dL (ref 3.5–5.2)
Calcium: 9.7 mg/dL (ref 8.4–10.5)
Chloride: 103 mEq/L (ref 96–112)
Creatinine, Ser: 1.13 mg/dL (ref 0.50–1.35)

## 2011-02-14 LAB — URINALYSIS, ROUTINE W REFLEX MICROSCOPIC
Nitrite: NEGATIVE
Specific Gravity, Urine: 1.018 (ref 1.005–1.030)
pH: 5.5 (ref 5.0–8.0)

## 2011-02-14 LAB — URINE MICROSCOPIC-ADD ON

## 2011-02-14 LAB — CBC
MCH: 26.7 pg (ref 26.0–34.0)
MCV: 79.5 fL (ref 78.0–100.0)
Platelets: 171 10*3/uL (ref 150–400)
RDW: 15.1 % (ref 11.5–15.5)
WBC: 7.3 10*3/uL (ref 4.0–10.5)

## 2011-02-14 LAB — PROTIME-INR: INR: 0.97 (ref 0.00–1.49)

## 2011-02-14 NOTE — Patient Instructions (Addendum)
[place]  02/14/2011   Your procedure is scheduled on: 02-18-2011   Report to [place] at  AM.-Call this number if you have problems the morning of surgery: [phone]   Remember: arrive 0630am   Do not eat food after midnight wendesday night clear liquids all day Thursday 02-16-2010.   Take these medicines the morning of surgery with A SIP OF WATER: no meds to take   Do not wear jewelry,  Do not wear lotions, powders do not bring valuables to the hospital.  Contacts, dentures or bridgework may not be worn into surgery.  Leave suitcase in the car. After surgery it may be brought to your room.  For patients admitted to the hospital, checkout time is 11:00 AM the day of discharge.   special Instructions: CHG Shower Use Special Wash: 1/2 bottle night before surgery and 1/2 bottle morning of surgery.neck down, do not use on face or private area   Please read over the following fact sheets that you were given: MRSA Information, blood fact sheet  Zelphia Cairo, rn [facility] pre op nurse phone number [phone]

## 2011-02-15 LAB — URINE CULTURE: Culture  Setup Time: 201301010117

## 2011-02-17 NOTE — H&P (Signed)
History of Present Illness    46 y/o male with h/o markedly hydronephrotic right kidney felt secondary to chronic UPJ obstruction, presents for acute evaluation of right flank pain.  He reports that he had the flu virus last week and had diffuse body aches.  He was started on tamiflu and overall seemed to begin to improve.  However, on Sunday 01/16/11 he developed acute severe right flank pain 9/10 in severity.  He presented to [facility] for evaluation and CTU was repeated which reavealed persistent severe right hydronephrosis with multiple renal stones, unchanged from prior imaging 03/2010. He denies dysuria, gross hematuria, frequency, urgency, fever or chills. His pain is currently well controlled with Tylenol 3.  He was initially evaluated in 2007 and he was advised then to have a right nephrectomy.  He was hesitant about having it.   He did not return for follow-up until 2010 and was again advised to have a right nephrectomy.  He again declined to have it.  He was seen by Jimmey Ralph 06/2010.  Renal scan showed 82% function left and 18% function right kidney.  At time of his follow up visit 07/2010, he was asymptomatic, without flank pain, fever or any other symptoms.  He has hypertension.   Past Medical History Problems  1. History of  Hypercholesterolemia 272.0 2. History of  Hypertension 401.9  Surgical History Problems  1. History of  Abdominal Surgery  Current Meds 1. Acetaminophen-Codeine #3 300-30 MG Oral Tablet; Therapy: 02Dec2012 to 2. Dicyclomine HCl 10 MG Oral Capsule; Therapy: 23Apr2012 to 3. Hydrochlorothiazide 12.5 MG Oral Capsule; Therapy: [id number] to 4. Mucinex 600 MG Oral Tablet Extended Release 12 Hour; Therapy: (Recorded:05Dec2012) to 5. Niaspan 500 MG Oral Tablet Extended Release; Therapy: [id number] to  Allergies Medication  1. No Known Drug Allergies  Family History Problems  1. Family history of  Family Health Status Number Of Children 2 daughters  Social  History Problems  1. Caffeine Use 2 per day 2. Family history of  Death In The Family Father 47 3. Family history of  Death In The Family Mother Jun 19, 2063. Marital History - Currently Married 5. Never A Smoker  Review of Systems Genitourinary, constitutional, skin, eye, otolaryngeal, hematologic/lymphatic, cardiovascular, pulmonary, endocrine, musculoskeletal, gastrointestinal, neurological and psychiatric system(s) were reviewed and pertinent findings if present are noted.  Gastrointestinal: flank pain and abdominal pain.    Vitals Vital Signs [Data Includes: Last 1 Day]  [id number] 10:30AM  Blood Pressure: 143 / 90 Temperature: 98.3 F Heart Rate: 93  Physical Exam Constitutional: Well nourished and well developed . No acute distress.  ENT:. The ears and nose are normal in appearance.  Neck: The appearance of the neck is normal and no neck mass is present.  Pulmonary: No respiratory distress and normal respiratory rhythm and effort.  Cardiovascular:. No peripheral edema.  Abdomen: The abdomen is soft and nontender. No CVA tenderness. Severely enlarged right kidney palpable on exam with firmness in the rightupper and mid quadrants.  Skin: Normal skin turgor, no visible rash and no visible skin lesions.  Neuro/Psych:. Mood and affect are appropriate.    Results/Data  Old records or history reviewed:.  Reviewed CTU and labs from recent ER visit 01/16/11  The following clinical lab reports were reviewed: Marland Kitchen  Urinalysis Selected Results  UA With REFLEX [id number] 09:06AM Bruning, Ashlyn   Test Name Result Flag Reference  COLOR YELLOW  YELLOW  APPEARANCE CLEAR  CLEAR  SPECIFIC GRAVITY 1.025  1.005-1.030  pH 6.0  5.0-8.0  GLUCOSE NEG mg/dL  NEG  BILIRUBIN NEG  NEG  KETONE NEG mg/dL  NEG  BLOOD SMALL A NEG  PROTEIN 100 mg/dL A NEG  UROBILINOGEN 0.2 mg/dL  0.0-1.0  NITRITE NEG  NEG  LEUKOCYTE ESTERASE NEG  NEG  SQUAMOUS EPITHELIAL/HPF NONE SEEN  RARE  WBC 4-6 WBC/hpf A <4  RBC 4-6  RBC/hpf A <4  BACTERIA NONE SEEN  RARE  CRYSTALS NONE SEEN  NEG  CASTS NONE SEEN  NEG   Assessment Assessed  1. Hydronephrosis 591 2. Nephrolithiasis 592.0 3. Ureteral Obstruction 593.4  Plan Health Maintenance (V70.0)  1. UA With REFLEX  [id number] 09:06AM      Treatment options discussed with Dr. Janice Norrie with recommendation to proceed with right nephrectomy as advised previously.  Risks and benefits discussed in detail and pt agrees to proceed. Orders completed.Building surveyor Electronically signed by : Freeman Caldron, PA-C; Jan 19 2011  4:25PM Electronically signed by : Lowella Bandy, M.D.; Jan 20 2011  4:56PM

## 2011-02-18 ENCOUNTER — Inpatient Hospital Stay (HOSPITAL_COMMUNITY): Payer: Managed Care, Other (non HMO) | Admitting: Certified Registered Nurse Anesthetist

## 2011-02-18 ENCOUNTER — Other Ambulatory Visit: Payer: Self-pay | Admitting: Urology

## 2011-02-18 ENCOUNTER — Encounter (HOSPITAL_COMMUNITY): Payer: Self-pay | Admitting: Certified Registered Nurse Anesthetist

## 2011-02-18 ENCOUNTER — Inpatient Hospital Stay (HOSPITAL_COMMUNITY)
Admission: RE | Admit: 2011-02-18 | Discharge: 2011-02-21 | DRG: 660 | Disposition: A | Discharge status: Home / Self Care | Payer: Managed Care, Other (non HMO) | Source: Ambulatory Visit | Attending: Urology | Admitting: Urology

## 2011-02-18 ENCOUNTER — Encounter (HOSPITAL_COMMUNITY)
Admission: RE | Disposition: A | Discharge status: Home / Self Care | Payer: Self-pay | Source: Ambulatory Visit | Attending: Urology

## 2011-02-18 DIAGNOSIS — N201 Calculus of ureter: Secondary | ICD-10-CM | POA: Diagnosis present

## 2011-02-18 DIAGNOSIS — N200 Calculus of kidney: Principal | ICD-10-CM | POA: Diagnosis present

## 2011-02-18 DIAGNOSIS — I10 Essential (primary) hypertension: Secondary | ICD-10-CM | POA: Diagnosis present

## 2011-02-18 DIAGNOSIS — E7800 Pure hypercholesterolemia, unspecified: Secondary | ICD-10-CM | POA: Diagnosis present

## 2011-02-18 DIAGNOSIS — N1330 Unspecified hydronephrosis: Secondary | ICD-10-CM | POA: Diagnosis present

## 2011-02-18 HISTORY — PX: NEPHRECTOMY: SHX65

## 2011-02-18 LAB — TYPE AND SCREEN: Antibody Screen: NEGATIVE

## 2011-02-18 SURGERY — NEPHRECTOMY
Anesthesia: General | Laterality: Right | Wound class: Clean

## 2011-02-18 MED ORDER — DEXAMETHASONE SODIUM PHOSPHATE 10 MG/ML IJ SOLN
INTRAMUSCULAR | Status: DC | PRN
  Administered 2011-02-18: 10 mg via INTRAVENOUS

## 2011-02-18 MED ORDER — ONDANSETRON HCL 4 MG/2ML IJ SOLN
INTRAMUSCULAR | Status: DC | PRN
  Administered 2011-02-18: 4 mg via INTRAVENOUS

## 2011-02-18 MED ORDER — BUPIVACAINE LIPOSOME 1.3 % IJ SUSP
20.0000 mL | Freq: Once | INTRAMUSCULAR | Status: DC
  Filled 2011-02-18: qty 20

## 2011-02-18 MED ORDER — PHENYLEPHRINE HCL 10 MG/ML IJ SOLN
10.0000 mg | INTRAVENOUS | Status: DC | PRN
  Administered 2011-02-18: 20 ug/min via INTRAVENOUS

## 2011-02-18 MED ORDER — DEXTROSE-NACL 5-0.45 % IV SOLN
INTRAVENOUS | Status: DC
  Administered 2011-02-18 – 2011-02-19 (×3): via INTRAVENOUS

## 2011-02-18 MED ORDER — ACETAMINOPHEN 10 MG/ML IV SOLN
INTRAVENOUS | Status: AC
  Filled 2011-02-18: qty 100

## 2011-02-18 MED ORDER — ONDANSETRON HCL 4 MG/2ML IJ SOLN: 4.0000 mg | Freq: Four times a day (QID) | INTRAMUSCULAR | Status: DC | PRN

## 2011-02-18 MED ORDER — LIDOCAINE HCL (CARDIAC) 20 MG/ML IV SOLN
INTRAVENOUS | Status: DC | PRN
  Administered 2011-02-18: 80 mg via INTRAVENOUS

## 2011-02-18 MED ORDER — HYDROMORPHONE HCL PF 1 MG/ML IJ SOLN
0.2500 mg | INTRAMUSCULAR | Status: DC | PRN
  Administered 2011-02-18 (×3): 0.5 mg via INTRAVENOUS

## 2011-02-18 MED ORDER — HYDROMORPHONE HCL PF 1 MG/ML IJ SOLN
INTRAMUSCULAR | Status: DC | PRN
  Administered 2011-02-18: 1 mg via INTRAVENOUS

## 2011-02-18 MED ORDER — MIDAZOLAM HCL 5 MG/5ML IJ SOLN
INTRAMUSCULAR | Status: DC | PRN
  Administered 2011-02-18: 2 mg via INTRAVENOUS

## 2011-02-18 MED ORDER — NEOSTIGMINE METHYLSULFATE 1 MG/ML IJ SOLN
INTRAMUSCULAR | Status: DC | PRN
  Administered 2011-02-18: 5 mg via INTRAVENOUS

## 2011-02-18 MED ORDER — HYDROMORPHONE HCL PF 1 MG/ML IJ SOLN: 2.0000 mg | INTRAMUSCULAR | Status: DC | PRN

## 2011-02-18 MED ORDER — ROCURONIUM BROMIDE 100 MG/10ML IV SOLN
INTRAVENOUS | Status: DC | PRN
Start: 2011-02-18 — End: 2011-02-18
  Administered 2011-02-18: 20 mg via INTRAVENOUS
  Administered 2011-02-18: 40 mg via INTRAVENOUS
  Administered 2011-02-18: 10 mg via INTRAVENOUS

## 2011-02-18 MED ORDER — EPHEDRINE SULFATE 50 MG/ML IJ SOLN
INTRAMUSCULAR | Status: DC | PRN
  Administered 2011-02-18: 10 mg via INTRAVENOUS

## 2011-02-18 MED ORDER — PROMETHAZINE HCL 25 MG/ML IJ SOLN: 6.2500 mg | INTRAMUSCULAR | Status: DC | PRN

## 2011-02-18 MED ORDER — HYDROMORPHONE HCL PF 1 MG/ML IJ SOLN
1.0000 mg | INTRAMUSCULAR | Status: DC | PRN
  Administered 2011-02-18: 1 mg via INTRAVENOUS
  Filled 2011-02-18: qty 1

## 2011-02-18 MED ORDER — MICROFIBRILLAR COLL HEMOSTAT EX PADS
MEDICATED_PAD | CUTANEOUS | Status: DC | PRN
  Administered 2011-02-18: 1 via TOPICAL

## 2011-02-18 MED ORDER — HETASTARCH-ELECTROLYTES 6 % IV SOLN
INTRAVENOUS | Status: DC | PRN
  Administered 2011-02-18: 10:00:00 via INTRAVENOUS

## 2011-02-18 MED ORDER — BUPIVACAINE LIPOSOME 1.3 % IJ SUSP
INTRAMUSCULAR | Status: DC | PRN
  Administered 2011-02-18: 20 mL

## 2011-02-18 MED ORDER — CEFAZOLIN SODIUM 1-5 GM-% IV SOLN
1.0000 g | Freq: Three times a day (TID) | INTRAVENOUS | Status: AC
  Administered 2011-02-18: 1 g via INTRAVENOUS
  Filled 2011-02-18: qty 50

## 2011-02-18 MED ORDER — FENTANYL CITRATE 0.05 MG/ML IJ SOLN
INTRAMUSCULAR | Status: DC | PRN
  Administered 2011-02-18: 50 ug via INTRAVENOUS
  Administered 2011-02-18: 100 ug via INTRAVENOUS
  Administered 2011-02-18 (×7): 50 ug via INTRAVENOUS

## 2011-02-18 MED ORDER — ONDANSETRON HCL 4 MG PO TABS: 4.0000 mg | ORAL_TABLET | Freq: Four times a day (QID) | ORAL | Status: DC | PRN

## 2011-02-18 MED ORDER — HYDROMORPHONE HCL PF 1 MG/ML IJ SOLN
INTRAMUSCULAR | Status: AC
  Filled 2011-02-18: qty 1

## 2011-02-18 MED ORDER — GLYCOPYRROLATE 0.2 MG/ML IJ SOLN
INTRAMUSCULAR | Status: DC | PRN
  Administered 2011-02-18: .8 mg via INTRAVENOUS

## 2011-02-18 MED ORDER — 0.9 % SODIUM CHLORIDE (POUR BTL) OPTIME
TOPICAL | Status: DC | PRN
  Administered 2011-02-18: 1000 mL

## 2011-02-18 MED ORDER — HYDROMORPHONE HCL PF 1 MG/ML IJ SOLN
1.0000 mg | INTRAMUSCULAR | Status: DC | PRN
  Administered 2011-02-18 – 2011-02-19 (×4): 1 mg via INTRAVENOUS
  Administered 2011-02-19: 2 mg via INTRAVENOUS
  Administered 2011-02-19: 1 mg via INTRAVENOUS
  Administered 2011-02-19: 2 mg via INTRAVENOUS
  Administered 2011-02-19: 1 mg via INTRAVENOUS
  Administered 2011-02-19: 2 mg via INTRAVENOUS
  Administered 2011-02-20 (×2): 1 mg via INTRAVENOUS
  Filled 2011-02-18: qty 2
  Filled 2011-02-18 (×6): qty 1
  Filled 2011-02-18: qty 2
  Filled 2011-02-18: qty 1
  Filled 2011-02-18: qty 2
  Filled 2011-02-18: qty 1

## 2011-02-18 MED ORDER — PROPOFOL 10 MG/ML IV EMUL
INTRAVENOUS | Status: DC | PRN
  Administered 2011-02-18: 200 mg via INTRAVENOUS

## 2011-02-18 MED ORDER — ACETAMINOPHEN 10 MG/ML IV SOLN
INTRAVENOUS | Status: DC | PRN
  Administered 2011-02-18: 1000 mg via INTRAVENOUS

## 2011-02-18 MED ORDER — CEFAZOLIN SODIUM-DEXTROSE 2-3 GM-% IV SOLR
2.0000 g | Freq: Once | INTRAVENOUS | Status: AC
  Administered 2011-02-18: 2 g via INTRAVENOUS

## 2011-02-18 MED ORDER — ACETAMINOPHEN 10 MG/ML IV SOLN
1000.0000 mg | Freq: Four times a day (QID) | INTRAVENOUS | Status: AC
  Administered 2011-02-18 – 2011-02-19 (×4): 1000 mg via INTRAVENOUS
  Filled 2011-02-18 (×4): qty 100

## 2011-02-18 MED ORDER — LACTATED RINGERS IV SOLN
INTRAVENOUS | Status: DC
  Administered 2011-02-18: 1000 mL via INTRAVENOUS
  Administered 2011-02-18: 09:00:00 via INTRAVENOUS

## 2011-02-18 MED ORDER — SUCCINYLCHOLINE CHLORIDE 20 MG/ML IJ SOLN
INTRAMUSCULAR | Status: DC | PRN
  Administered 2011-02-18: 100 mg via INTRAVENOUS

## 2011-02-18 SURGICAL SUPPLY — 63 items
ATTRACTOMAT 16X20 MAGNETIC DRP (DRAPES) ×1 IMPLANT
BLADE EXTENDED COATED 6.5IN (ELECTRODE) ×2 IMPLANT
BLADE HEX COATED 2.75 (ELECTRODE) ×2 IMPLANT
BLADE SURG SZ10 CARB STEEL (BLADE) ×1 IMPLANT
CANISTER SUCTION 2500CC (MISCELLANEOUS) ×2 IMPLANT
CATH TIEMANN FOLEY 18FR 5CC (CATHETERS) ×1 IMPLANT
CLIP LIGATING HEM O LOK PURPLE (MISCELLANEOUS) ×6 IMPLANT
CLIP LIGATING HEMO O LOK GREEN (MISCELLANEOUS) ×7 IMPLANT
CLIP TI LARGE 6 (CLIP) ×1 IMPLANT
CLOTH BEACON ORANGE TIMEOUT ST (SAFETY) ×2 IMPLANT
COVER SURGICAL LIGHT HANDLE (MISCELLANEOUS) ×2 IMPLANT
DECANTER SPIKE VIAL GLASS SM (MISCELLANEOUS) IMPLANT
DISSECTOR ROUND CHERRY 3/8 STR (MISCELLANEOUS) ×2 IMPLANT
DRAIN CHANNEL 10F 3/8 F FF (DRAIN) IMPLANT
DRAIN PENROSE 18X1/2 LTX STRL (DRAIN) IMPLANT
DRAPE LAPAROSCOPIC ABDOMINAL (DRAPES) ×1 IMPLANT
DRAPE LAPAROTOMY TRNSV 102X78 (DRAPE) IMPLANT
DRAPE TABLE BACK 44X90 PK DISP (DRAPES) ×1 IMPLANT
DRAPE UTILITY XL STRL (DRAPES) ×2 IMPLANT
DRAPE WARM FLUID 44X44 (DRAPE) ×2 IMPLANT
ELECT REM PT RETURN 9FT ADLT (ELECTROSURGICAL) ×2
ELECTRODE REM PT RTRN 9FT ADLT (ELECTROSURGICAL) ×1 IMPLANT
EVACUATOR SILICONE 100CC (DRAIN) IMPLANT
FLOSEAL (HEMOSTASIS) IMPLANT
GAUZE SPONGE 4X4 12PLY STRL LF (GAUZE/BANDAGES/DRESSINGS) ×1 IMPLANT
GAUZE SPONGE 4X4 16PLY XRAY LF (GAUZE/BANDAGES/DRESSINGS) ×2 IMPLANT
GLOVE BIOGEL M 7.0 STRL (GLOVE) ×2 IMPLANT
GOWN STRL NON-REIN LRG LVL3 (GOWN DISPOSABLE) ×2 IMPLANT
GOWN STRL REIN XL XLG (GOWN DISPOSABLE) ×2 IMPLANT
HEMOSTAT SURGICEL 4X8 (HEMOSTASIS) ×1 IMPLANT
HOLDER FOLEY CATH W/STRAP (MISCELLANEOUS) ×2 IMPLANT
KIT BASIN OR (CUSTOM PROCEDURE TRAY) ×2 IMPLANT
LOOP VESSEL MAXI BLUE (MISCELLANEOUS) ×2 IMPLANT
LUBRICANT JELLY ST 5GR 8946 (MISCELLANEOUS) ×5 IMPLANT
NS IRRIG 1000ML POUR BTL (IV SOLUTION) ×2 IMPLANT
PACK GENERAL/GYN (CUSTOM PROCEDURE TRAY) ×2 IMPLANT
POSITIONER SURGICAL ARM (MISCELLANEOUS) ×3 IMPLANT
SPONGE GAUZE 4X4 12PLY (GAUZE/BANDAGES/DRESSINGS) ×3 IMPLANT
SPONGE LAP 18X18 X RAY DECT (DISPOSABLE) ×4 IMPLANT
SPONGE SURGIFOAM ABS GEL 100 (HEMOSTASIS) IMPLANT
STAPLER VISISTAT 35W (STAPLE) ×2 IMPLANT
SUT ETHILON 3 0 PS 1 (SUTURE) IMPLANT
SUT MNCRL AB 4-0 PS2 18 (SUTURE) IMPLANT
SUT PDS AB 0 CTX 60 (SUTURE) ×2 IMPLANT
SUT PDS AB 0 CTXB 36 (SUTURE) ×4 IMPLANT
SUT SILK 0 (SUTURE) ×6
SUT SILK 0 30XBRD TIE 6 (SUTURE) ×1 IMPLANT
SUT SILK 1 TIES 10/18 (SUTURE) ×2 IMPLANT
SUT SILK 2 0 (SUTURE) ×2
SUT SILK 2-0 30XBRD TIE 12 (SUTURE) ×1 IMPLANT
SUT SILK 3 0 12 30 (SUTURE) IMPLANT
SUT VIC AB 0 CT1 27 (SUTURE)
SUT VIC AB 0 CT1 27XBRD ANTBC (SUTURE) IMPLANT
SUT VICRYL 0 TIES 12 18 (SUTURE) IMPLANT
SYR 50ML LL SCALE MARK (SYRINGE) ×1 IMPLANT
SYRINGE 10CC LL (SYRINGE) ×1 IMPLANT
TAPE CLOTH SURG 4X10 WHT LF (GAUZE/BANDAGES/DRESSINGS) ×2 IMPLANT
TAPE UMBILICAL COTTON 1/8X30 (MISCELLANEOUS) ×1 IMPLANT
TOWEL OR 17X26 10 PK STRL BLUE (TOWEL DISPOSABLE) ×2 IMPLANT
TOWEL OR NON WOVEN STRL DISP B (DISPOSABLE) ×2 IMPLANT
TRAY FOLEY CATH 14FRSI W/METER (CATHETERS) ×2 IMPLANT
TUBING CONNECTING 10 (TUBING) ×2 IMPLANT
WATER STERILE IRR 1500ML POUR (IV SOLUTION) ×1 IMPLANT

## 2011-02-18 NOTE — Op Note (Addendum)
[name] is a 46 y.o.   02/18/2011  Anesthesia: General  Surgeon: [name]. [name]  Date of procedure: 02/18/2011  Pre-Op Diagnosis: Severe right hydronephrosis, right renal calculi.  Post-Op Diagnosis: Same  Procedure done: Right nephrectomy.  Assistant: Dr. Gaynelle Barnett  Indication: Patient is a 46 years old male who was seen about 5 years ago for right flank pain. CT scan showed severe hydronephrosis with several stones in the kidney. He was then advised then to have a nephrectomy. However he declined. About a month ago he experienced severe right flank pain. He went to the emergency room where a repeat CT scan showed a severe right hydronephrosis, several stones in the kidney.The parenchyma of the kidney is very thin. There is a minimal function of that kidney. The patient was then advised again to get a nephrectomy. The procedure, the risks and benefits were discussed with him. The risks include but are not limited to hemorrhage, infection, injury to adjacent organs. He understands and gave informed consent.  Procedure: The patient was identified by his wrist band and proper timeout was taken.  Findings: Severe right hydronephrosis with a large cystic mass occupying the right upper quadrant and right flank.  Under general anesthesia patient was prepped and draped and placed in the supine position. A right subcostal incision was made. The incision was carried down to the subcutaneous tissues. Then the rectus fascia and the right rectus muscle was incised. The external oblique fascia and external oblique muscle were also incised. The internal oblique and transversus muscles were then incised. The peritoneum was then entered. The cystic mass expands beyond the midline. I then extended the incision at about 6 cm beyond the midline parallel to the costal margin. An incision was then made in the right paracolic gutter and the right colon was mobilized. The appendix was found to be in a retrocecal  position. Then with blunt and sharp dissection a large hydronephrotic kidney was dissected from the surrounding tissues. The duodenum was kocherized. Then the renal hilum was identified. The vena cava was also dissected from the right kidney. The renal vein was identified and secured with a vessi loop. The right renal artery was then identified behind the renal vein, dissected from the surrounding tissues. It was then ligated with #0 silk with 2 sutures on the remaining side. The stump of the renal artery was also ligated with hemoclips. Then the renal vein was completely dissected from the surrounding tissues. It was also doubly ligated with 0 silk with 2 sutures on the remaining stump. The stump of the renal vein was also ligated with hemoclips. The ureteral was then identified and freed from the surrounding tissues. It was ligated with hemoclips. Then the large hydronephrotic kidney was completely dissected from every surrounding tissues. And the specimen was removed in toto. Hemostasis was then completed with hemoclips and electrocautery. The wound was then irrigated with normal saline. There was no evidence of some bleeding at the end of the procedure. A piece of Surgicel was then placed in the kidney bed. The small bowel and the colon were then placed in the normal position.  The wound was then closed in 2 layers as follows: The left rectus muscle was approximated with #0 PDS. The internal oblique and transversus muscles were closed with #0 PDS. The rectus fascia and external oblique fascia were also closed with #0 PDS. Prior to closure of the muscles 120 cc of Exparel were infiltrated into the muscles. The skin was closed with  skin staples.   EBL: 150 cc  Blood replacement: None  Needles, sponges count: Correct on 2 occasions.  The patient tolerated the procedure well and left the OR in satisfactory condition to postanesthesia care unit.

## 2011-02-18 NOTE — Transfer of Care (Signed)
Immediate Anesthesia Transfer of Care Note  Patient: [name]  Procedure(s) Performed:  NEPHRECTOMY  Patient Location: PACU  Anesthesia Type: General  Level of Consciousness: awake and alert   Airway & Oxygen Therapy: Patient Spontanous Breathing and Patient connected to face mask oxygen  Post-op Assessment: Report given to PACU RN and Post -op Vital signs reviewed and stable  Post vital signs: Reviewed and stable  Complications: No apparent anesthesia complications

## 2011-02-18 NOTE — Anesthesia Postprocedure Evaluation (Signed)
  Anesthesia Post-op Note  Patient: [name]  Procedure(s) Performed:  NEPHRECTOMY  Patient Location: PACU  Anesthesia Type: General  Level of Consciousness: oriented and sedated  Airway and Oxygen Therapy: Patient Spontanous Breathing and Patient connected to nasal cannula oxygen  Post-op Pain: mild  Post-op Assessment: Post-op Vital signs reviewed, Patient's Cardiovascular Status Stable, Respiratory Function Stable and Patent Airway  Post-op Vital Signs: stable  Complications: No apparent anesthesia complications

## 2011-02-18 NOTE — Progress Notes (Signed)
Post-Op rounds  T:  98.7  BP:  138/87  P:  106     R; 18 C/o incisional pain.  No nausea. Abdomen: soft, non distended.  Wound clean and dry. Foley draining clear urine. Satisfactory early post-op. Will remove Foley in AM.

## 2011-02-18 NOTE — Progress Notes (Signed)
Report given to Trude Mcburney, R.N. For lunch relief

## 2011-02-18 NOTE — Anesthesia Preprocedure Evaluation (Addendum)
Anesthesia Evaluation  Patient identified by MRN, date of birth, ID band Patient awake    Reviewed: Allergy & Precautions, H&P , NPO status , Patient's Chart, lab work & pertinent test results, reviewed documented beta blocker date and time   Airway Mallampati: II TM Distance: >3 FB Neck ROM: Full    Dental  (+) Dental Advisory Given   Pulmonary neg pulmonary ROS,  clear to auscultation        Cardiovascular neg cardio ROS Regular Normal Pt denies hx of HTN Denies cardiac symptoms   Neuro/Psych Negative Neurological ROS  Negative Psych ROS   GI/Hepatic negative GI ROS, Neg liver ROS,   Endo/Other  Negative Endocrine ROS  Renal/GU Rt nephrectomy Nl Cr  Genitourinary negative   Musculoskeletal negative musculoskeletal ROS (+)   Abdominal   Peds negative pediatric ROS (+)  Hematology negative hematology ROS (+)   Anesthesia Other Findings   Reproductive/Obstetrics negative OB ROS                          Anesthesia Physical Anesthesia Plan  ASA: II  Anesthesia Plan: General   Post-op Pain Management:    Induction: Intravenous  Airway Management Planned: Oral ETT  Additional Equipment:   Intra-op Plan:   Post-operative Plan: Extubation in OR  Informed Consent: I have reviewed the patients History and Physical, chart, labs and discussed the procedure including the risks, benefits and alternatives for the proposed anesthesia with the patient or authorized representative who has indicated his/her understanding and acceptance.     Plan Discussed with: CRNA and Surgeon  Anesthesia Plan Comments:         Anesthesia Quick Evaluation

## 2011-02-19 LAB — CBC
HCT: 40.8 % (ref 39.0–52.0)
Hemoglobin: 13.8 g/dL (ref 13.0–17.0)
MCH: 27.5 pg (ref 26.0–34.0)
MCHC: 33.8 g/dL (ref 30.0–36.0)
MCV: 81.3 fL (ref 78.0–100.0)
RDW: 15.5 % (ref 11.5–15.5)

## 2011-02-19 LAB — BASIC METABOLIC PANEL
BUN: 16 mg/dL (ref 6–23)
CO2: 25 mEq/L (ref 19–32)
Chloride: 100 mEq/L (ref 96–112)
Creatinine, Ser: 1.34 mg/dL (ref 0.50–1.35)
GFR calc Af Amer: 72 mL/min — ABNORMAL LOW (ref >90)
Glucose, Bld: 119 mg/dL — ABNORMAL HIGH (ref 70–99)
Potassium: 4.6 mEq/L (ref 3.5–5.1)

## 2011-02-19 MED ORDER — OXYCODONE-ACETAMINOPHEN 5-325 MG PO TABS
1.0000 | ORAL_TABLET | ORAL | Status: DC | PRN
  Administered 2011-02-20 – 2011-02-21 (×5): 2 via ORAL
  Filled 2011-02-19 (×5): qty 2

## 2011-02-19 MED ORDER — ZOLPIDEM TARTRATE 5 MG PO TABS
5.0000 mg | ORAL_TABLET | Freq: Every evening | ORAL | Status: DC | PRN
  Administered 2011-02-19 – 2011-02-20 (×2): 5 mg via ORAL
  Filled 2011-02-19 (×2): qty 1

## 2011-02-19 NOTE — Progress Notes (Signed)
1 Day Post-Op Subjective: Patient complains of some sore throat and incisional pain. No N/V. No CP or SOB. Voiding well.   Objective: Vital signs in last 24 hours: Temp:  [97.5 F (36.4 C)-98.9 F (37.2 C)] 98.6 F (37 C) (01/05 1344) Pulse Rate:  [88-101] 97  (01/05 1344) Resp:  [18] 18  (01/05 1344) BP: (141-150)/(78-100) 143/88 mmHg (01/05 1344) SpO2:  [87 %-96 %] 91 % (01/05 1345)  Intake/Output from previous day: 01/04 0701 - 01/05 0700 In: 4100 [I.V.:3300; IV Piggyback:800] Out: 1500 [Urine:1350; Blood:150] Intake/Output this shift: Total I/O In: 1545.8 [I.V.:1545.8] Out: 450 [Urine:450]  Physical Exam:  Gen - NAD, A&O x 3 , watching TV Abd - soft, incisional tender, Left side NT, no R/G Ext - no swelling,  pain or edema.  Lab Results:  [facility] 02/19/11 0536  HGB 13.8  HCT 40.8   BMET  Basename 02/19/11 0536  NA 132*  K 4.6  CL 100  CO2 25  GLUCOSE 119*  BUN 16  CREATININE 1.34  CALCIUM 8.7   No results found for this basename: LABPT:3,INR:3 in the last 72 hours No results found for this basename: LABURIN:1 in the last 72 hours Results for orders placed during the hospital encounter of 02/14/11  SURGICAL PCR SCREEN     Status: Abnormal   Collection Time   02/14/11  2:40 PM      Component Value Range Status Comment   MRSA, PCR NEGATIVE  NEGATIVE  Final    Staphylococcus aureus POSITIVE (*) NEGATIVE  Final   URINE CULTURE     Status: Normal   Collection Time   02/14/11  3:20 PM      Component Value Range Status Comment   Specimen Description URINE, CLEAN CATCH   Final    Special Requests NONE   Final    Setup Time [id number]   Final    Colony Count NO GROWTH   Final    Culture NO GROWTH   Final    Report Status 02/15/2011 FINAL   Final     Studies/Results: No results found.  Assessment/Plan: POD#1 right radical nephrectomy -decrease IVF -clears -encourage ambulation and IS   LOS: 1 day   Fredricka Bonine 02/19/2011, 4:10  PM

## 2011-02-19 NOTE — Progress Notes (Signed)
Patient oxygen was decreased to 1 L/M [place] this morning with oxygen saturation around 91-92%.  After patient returned from ambulating to the bathroom, his oxygen levels were checked on room air which revealed 87%.  Applied oxygen at 1 L/M  and oxygen saturation returned to 91%. Will continue to monitor oxygen saturation and adjust according to levels. Encouraged patient to use incentive spirometer every hour while he is awake approximately 10 times. Patient agreed.  At this time, he is currently reaching 750 on the IS.  Also encouraged patient to turn, cough, and deep breath throughout the day.  Patient was concerned with coughing d/t abdominal incision.  Instructed patient that he could splint his incision with a pillow while coughing to decrease in discomfort.  Will continue to follow. Imagene Boss, Dudley Major

## 2011-02-19 NOTE — Plan of Care (Signed)
Problem: Phase I Progression Outcomes Goal: Initial discharge plan identified Outcome: Completed/Met Date Met:  02/19/11 Pt plans to return home

## 2011-02-20 LAB — BASIC METABOLIC PANEL
BUN: 14 mg/dL (ref 6–23)
Creatinine, Ser: 1.26 mg/dL (ref 0.50–1.35)
GFR calc non Af Amer: 67 mL/min — ABNORMAL LOW (ref >90)
Glucose, Bld: 94 mg/dL (ref 70–99)
Potassium: 3.9 mEq/L (ref 3.5–5.1)

## 2011-02-20 MED ORDER — MAGNESIUM HYDROXIDE 400 MG/5ML PO SUSP
30.0000 mL | Freq: Every day | ORAL | Status: DC
  Administered 2011-02-20 – 2011-02-21 (×2): 30 mL via ORAL
  Filled 2011-02-20 (×2): qty 30

## 2011-02-20 MED ORDER — HYDROCHLOROTHIAZIDE 12.5 MG PO CAPS
12.5000 mg | ORAL_CAPSULE | Freq: Every day | ORAL | Status: DC
  Administered 2011-02-20 – 2011-02-21 (×2): 12.5 mg via ORAL
  Filled 2011-02-20 (×2): qty 1

## 2011-02-20 MED ORDER — FAMOTIDINE 20 MG PO TABS
20.0000 mg | ORAL_TABLET | Freq: Every day | ORAL | Status: DC
  Administered 2011-02-20 – 2011-02-21 (×2): 20 mg via ORAL
  Filled 2011-02-20 (×2): qty 1

## 2011-02-20 MED ORDER — BISACODYL 10 MG RE SUPP
10.0000 mg | Freq: Once | RECTAL | Status: AC
  Administered 2011-02-20: 10 mg via RECTAL
  Filled 2011-02-20: qty 1

## 2011-02-20 MED ORDER — CELECOXIB 100 MG PO CAPS
100.0000 mg | ORAL_CAPSULE | Freq: Two times a day (BID) | ORAL | Status: DC
  Administered 2011-02-20 – 2011-02-21 (×2): 100 mg via ORAL
  Filled 2011-02-20 (×3): qty 1

## 2011-02-20 NOTE — Progress Notes (Addendum)
2 Days Post-Op Subjective: Complains of incisional pain. No CP , No SOB. No dysuria. No flatus.   Objective: Vital signs in last 24 hours: Temp:  [98.6 F (37 C)-99.4 F (37.4 C)] 98.6 F (37 C) (01/06 1356) Pulse Rate:  [90-100] 100  (01/06 1356) Resp:  [18-20] 18  (01/06 1356) BP: (145-149)/(90-108) 149/108 mmHg (01/06 1356) SpO2:  [92 %-97 %] 92 % (01/06 1356)  Intake/Output from previous day: 01/05 0701 - 01/06 0700 In: 4162.1 [P.O.:480; I.V.:3682.1] Out: 1350 [Urine:1350] Intake/Output this shift: Total I/O In: 360 [P.O.:360] Out: 550 [Urine:550]  Physical Exam:  Gen - looks well. Lying in bed watching TV. CV - RRR Lungs - normal effort, depth, rate Abd - soft, NT, mild distention. Incision C/D/I with E/E/I Ext - no edema, pain or swelling   Lab Results:  Basename 02/19/11 0536  HGB 13.8  HCT 40.8   BMET  Basename 02/19/11 0536  NA 132*  K 4.6  CL 100  CO2 25  GLUCOSE 119*  BUN 16  CREATININE 1.34  CALCIUM 8.7   No results found for this basename: LABPT:3,INR:3 in the last 72 hours No results found for this basename: LABURIN:1 in the last 72 hours Results for orders placed during the hospital encounter of 02/14/11  SURGICAL PCR SCREEN     Status: Abnormal   Collection Time   02/14/11  2:40 PM      Component Value Range Status Comment   MRSA, PCR NEGATIVE  NEGATIVE  Final    Staphylococcus aureus POSITIVE (*) NEGATIVE  Final   URINE CULTURE     Status: Normal   Collection Time   02/14/11  3:20 PM      Component Value Range Status Comment   Specimen Description URINE, CLEAN CATCH   Final    Special Requests NONE   Final    Setup Time [id number]   Final    Colony Count NO GROWTH   Final    Culture NO GROWTH   Final    Report Status 02/15/2011 FINAL   Final     Studies/Results: No results found.  Assessment/Plan:  advance diet, colace, dulcolax  Low dose celebrex for pain - ADD: if BMP normal  Restart HCTZ   LOS: 2 days    Fredricka Bonine 02/20/2011, 2:50 PM

## 2011-02-21 MED ORDER — OXYCODONE-ACETAMINOPHEN 5-325 MG PO TABS
1.0000 | ORAL_TABLET | ORAL | Status: DC | PRN
  Administered 2011-02-21: 2 via ORAL
  Filled 2011-02-21: qty 2

## 2011-02-21 MED ORDER — HYDROCHLOROTHIAZIDE 25 MG PO TABS
25.0000 mg | ORAL_TABLET | Freq: Every day | ORAL | Status: DC
  Administered 2011-02-21: 25 mg via ORAL
  Filled 2011-02-21: qty 1

## 2011-02-21 MED ORDER — CEPHALEXIN 500 MG PO CAPS
500.0000 mg | ORAL_CAPSULE | Freq: Four times a day (QID) | ORAL | Status: DC
  Administered 2011-02-21: 500 mg via ORAL
  Filled 2011-02-21 (×4): qty 1

## 2011-02-21 NOTE — Progress Notes (Signed)
Patient educated on signs and symptoms of infection from his incision site and when to call the doctor.  Prescriptions given to patient at discharge.  Follow up appointment in place for patient on January 14th at [facility].  Questions answered by patient and family member.  Arelene Moroni, Dudley Major

## 2011-02-21 NOTE — Discharge Summary (Signed)
Discharge summary  Patient is a 46 years old male who was admitted on 02/17/2010 with a history of right flank pain. CT scan showed markedly hydronephrotic right kidney with multiple renal calculi. This is a long-standing obstruction probably secondary to UPJ obstruction. The parenchyma is extremely thin and there is minimal function of that kidney. The patient was admitted on 1/4 for a right nephrectomy.  On physical examination his blood pressure was 143/90 pulse 93 temperature 98.3  His lungs were clear.  Heart regular rate and rhythm.  Abdomen is soft, moderately distended, nontender  Chest x-ray showed no evidence of active disease.  Patient had a right nephrectomy on 02/17/2010. The postop course was uneventful except for complaint of incisional pain. The pain was controlled with oral analgesics. His blood pressure: 02/20/2010 was 149/108, pulse 100, respiration 18 and temperature 98.6. He had 2 bowel movements. He was voiding well, his urine was clear. He tolerated his diet well. The wound was clean and dry. His hemoglobin was 13.8 on 02/18/2010, hematocrit 40.8, WBC 13,000. BUN 14, creatinine 1.26, sodium 136, potassium 3.9.  He was then discharged home on HCTZ for hypertension 25 mg daily, oxycodone/APAP 5/325 one or 2 tablets every 4 hours as needed for pain, Keflex 500 mg every 6 hours.  Condition on discharge: improved. Discharge diet: Regular  The patient is instructed not to do any lifting, driving, straining or sexual activities until further advised.  He will be seen in the office next week for removal of skin staples.

## 2011-02-21 NOTE — Progress Notes (Signed)
P O Day  #3  BP : 149/108   P: 100   R: 18  T: 98.6 C/O incisional pain.  Had 2 bowel movements.  Tolerates diet well.   He voids well.  Urine is clear. Lungs clear.  Abdomen soft non distended.  Wound clean and dry. Plan: Discharge home today.  Follow-up next week for skin staples removal.

## 2011-02-22 ENCOUNTER — Encounter (HOSPITAL_COMMUNITY): Payer: Self-pay | Admitting: Urology

## 2011-02-24 NOTE — Progress Notes (Signed)
Discharge summary sent to payer through [facility]  

## 2011-03-24 ENCOUNTER — Emergency Department (HOSPITAL_COMMUNITY)
Admission: EM | Admit: 2011-03-24 | Discharge: 2011-03-24 | Payer: Managed Care, Other (non HMO) | Source: Home / Self Care

## 2011-04-12 ENCOUNTER — Ambulatory Visit: Payer: Managed Care, Other (non HMO)

## 2011-04-12 ENCOUNTER — Ambulatory Visit (INDEPENDENT_AMBULATORY_CARE_PROVIDER_SITE_OTHER): Payer: Managed Care, Other (non HMO) | Admitting: Internal Medicine

## 2011-04-12 ENCOUNTER — Encounter: Payer: Self-pay | Admitting: Internal Medicine

## 2011-04-12 DIAGNOSIS — Z905 Acquired absence of kidney: Secondary | ICD-10-CM

## 2011-04-12 DIAGNOSIS — R109 Unspecified abdominal pain: Secondary | ICD-10-CM

## 2011-04-12 DIAGNOSIS — R5381 Other malaise: Secondary | ICD-10-CM

## 2011-04-12 DIAGNOSIS — K5900 Constipation, unspecified: Secondary | ICD-10-CM

## 2011-04-12 DIAGNOSIS — E785 Hyperlipidemia, unspecified: Secondary | ICD-10-CM

## 2011-04-12 DIAGNOSIS — R5383 Other fatigue: Secondary | ICD-10-CM

## 2011-04-12 LAB — POCT UA - MICROSCOPIC ONLY
Bacteria, U Microscopic: NEGATIVE
Epithelial cells, urine per micros: NEGATIVE
Mucus, UA: NEGATIVE
RBC, urine, microscopic: NEGATIVE

## 2011-04-12 LAB — COMPREHENSIVE METABOLIC PANEL
ALT: 14 U/L (ref 0–53)
AST: 15 U/L (ref 0–37)
Calcium: 10 mg/dL (ref 8.4–10.5)
Chloride: 103 mEq/L (ref 96–112)
Creat: 1.16 mg/dL (ref 0.50–1.35)
Total Bilirubin: 0.7 mg/dL (ref 0.3–1.2)

## 2011-04-12 LAB — POCT CBC
Granulocyte percent: 59.6 %G (ref 37–80)
MCH, POC: 27.3 pg (ref 27–31.2)
MID (cbc): 0.4 (ref 0–0.9)
MPV: 10.6 fL (ref 0–99.8)
POC Granulocyte: 4.1 (ref 2–6.9)
POC MID %: 5.8 %M (ref 0–12)
Platelet Count, POC: 242 10*3/uL (ref 142–424)
RBC: 5.35 M/uL (ref 4.69–6.13)
WBC: 6.9 10*3/uL (ref 4.6–10.2)

## 2011-04-12 LAB — POCT URINALYSIS DIPSTICK
Bilirubin, UA: NEGATIVE
Glucose, UA: NEGATIVE
Ketones, UA: NEGATIVE
Spec Grav, UA: 1.02

## 2011-04-12 MED ORDER — CALCIUM POLYCARBOPHIL 625 MG PO TABS: 625.0000 mg | ORAL_TABLET | Freq: Every day | ORAL | Status: DC

## 2011-04-12 MED ORDER — POLYETHYLENE GLYCOL 3350 17 GM/SCOOP PO POWD: 17.0000 g | Freq: Two times a day (BID) | ORAL | Status: AC | PRN

## 2011-04-12 NOTE — Progress Notes (Signed)
Subjective:    Patient ID: [name], male    DOB: 09/28/1965, 46 y.o.   MRN: [id number]  HPI CC: feels bloated,having gas, decreased bm, fatigue. Onset 3 weeks ago after recent surgery Had nephrectomy for chronic kidney infections in january, Dr. Lowella Bandy Also requests cholesterol check because he did not start the [place] and he has concerns about his bp. He was on hctz 25 mg after the surgery but has finished it now.   Review of Systems  Constitutional: Positive for appetite change and fatigue.  HENT: Negative.   Eyes: Negative.   Respiratory: Negative.   Cardiovascular: Negative.   Gastrointestinal: Positive for constipation and abdominal distention.  Genitourinary: Negative.   Musculoskeletal: Negative.   Skin: Negative.   Neurological: Negative.   Hematological: Negative.   Psychiatric/Behavioral: Negative.        Objective:   Physical Exam  Constitutional: He is oriented to person, place, and time. He appears well-developed and well-nourished.  HENT:  Head: Normocephalic and atraumatic.  Eyes: Conjunctivae and EOM are normal. Pupils are equal, round, and reactive to light.  Neck: Normal range of motion. Neck supple.  Cardiovascular: Normal rate, regular rhythm and normal heart sounds.   Pulmonary/Chest: Effort normal and breath sounds normal.  Abdominal: Soft. He exhibits distension. He exhibits no mass. There is no tenderness. There is no rebound and no guarding.  Neurological: He is alert and oriented to person, place, and time. He has normal reflexes.  Skin: Skin is warm and dry.  Psychiatric: He has a normal mood and affect. His behavior is normal. Judgment and thought content normal.     Results for orders placed during the hospital encounter of 02/18/11  TYPE AND SCREEN      Component Value Range   ABO/RH(D) O POS     Antibody Screen NEG     Sample Expiration 02/21/2011    ABO/RH      Component Value Range   ABO/RH(D) O POS    BASIC METABOLIC PANEL     Component Value Range   Sodium 132 (*) 135 - 145 (mEq/L)   Potassium 4.6  3.5 - 5.1 (mEq/L)   Chloride 100  96 - 112 (mEq/L)   CO2 25  19 - 32 (mEq/L)   Glucose, Bld 119 (*) 70 - 99 (mg/dL)   BUN 16  6 - 23 (mg/dL)   Creatinine, Ser 1.34  0.50 - 1.35 (mg/dL)   Calcium 8.7  8.4 - 10.5 (mg/dL)   GFR calc non Af Amer 62 (*) >90 (mL/min)   GFR calc Af Amer 72 (*) >90 (mL/min)  CBC      Component Value Range   WBC 13.0 (*) 4.0 - 10.5 (K/uL)   RBC 5.02  4.22 - 5.81 (MIL/uL)   Hemoglobin 13.8  13.0 - 17.0 (g/dL)   HCT 40.8  39.0 - 52.0 (%)   MCV 81.3  78.0 - 100.0 (fL)   MCH 27.5  26.0 - 34.0 (pg)   MCHC 33.8  30.0 - 36.0 (g/dL)   RDW 15.5  11.5 - 15.5 (%)   Platelets 167  150 - 400 (K/uL)  BASIC METABOLIC PANEL      Component Value Range   Sodium 136  135 - 145 (mEq/L)   Potassium 3.9  3.5 - 5.1 (mEq/L)   Chloride 101  96 - 112 (mEq/L)   CO2 28  19 - 32 (mEq/L)   Glucose, Bld 94  70 - 99 (mg/dL)   BUN 14  6 - 23 (mg/dL)   Creatinine, Ser 1.26  0.50 - 1.35 (mg/dL)   Calcium 9.2  8.4 - 10.5 (mg/dL)   GFR calc non Af Amer 67 (*) >90 (mL/min)   GFR calc Af Amer 78 (*) >90 (mL/min)   Results for orders placed in visit on 04/12/11  POCT URINALYSIS DIPSTICK      Component Value Range   Color, UA yellow     Clarity, UA clear     Glucose, UA negative     Bilirubin, UA negative     Ketones, UA negative     Spec Grav, UA 1.020     Blood, UA negative     pH, UA 6.0     Protein, UA 100     Urobilinogen, UA 0.2     Nitrite, UA negative     Leukocytes, UA Negative    POCT UA - MICROSCOPIC ONLY      Component Value Range   WBC, Ur, HPF, POC 0-1     RBC, urine, microscopic negative     Bacteria, U Microscopic negative     Mucus, UA negative     Epithelial cells, urine per micros negative     Crystals, Ur, HPF, POC negative     Casts, Ur, LPF, POC negative     Yeast, UA negative    POCT CBC      Component Value Range   WBC 6.9  4.6 - 10.2 (K/uL)   Lymph, poc 2.4  0.6 - 3.4      POC LYMPH PERCENT 34.6  10 - 50 (%L)   MID (cbc) 0.4  0 - 0.9    POC MID % 5.8  0 - 12 (%M)   POC Granulocyte 4.1  2 - 6.9    Granulocyte percent 59.6  37 - 80 (%G)   RBC 5.35  4.69 - 6.13 (M/uL)   Hemoglobin 14.6  14.1 - 18.1 (g/dL)   HCT, POC 44.4  43.5 - 53.7 (%)   MCV 82.9  80 - 97 (fL)   MCH, POC 27.3  27 - 31.2 (pg)   MCHC 32.9  31.8 - 35.4 (g/dL)   RDW, POC 14.5     Platelet Count, POC 242  142 - 424 (K/uL)   MPV 10.6  0 - 99.8 (fL)     labs reviewed and are within nml limits s [facility] reading (PRIMARY) by  Dr. Benjaman Lobe Constipation nonspecific bowel gas paattern.  Assessment & Plan:  Abdominal pain Distention Hyperlipidemia recent neprectomy Plan; labs abd xray ua

## 2011-04-12 NOTE — Patient Instructions (Signed)
Take Fibercon 1 tab daily as directed. Return in 1 week to Dr. Leward Quan to review labs and determine the need for medication for your htn and high cholesterol and to monister your kidney function.

## 2011-04-17 ENCOUNTER — Encounter: Payer: Self-pay | Admitting: Internal Medicine

## 2011-04-26 ENCOUNTER — Ambulatory Visit (INDEPENDENT_AMBULATORY_CARE_PROVIDER_SITE_OTHER): Payer: Managed Care, Other (non HMO) | Admitting: Family Medicine

## 2011-04-26 ENCOUNTER — Encounter: Payer: Self-pay | Admitting: Family Medicine

## 2011-04-26 DIAGNOSIS — E559 Vitamin D deficiency, unspecified: Secondary | ICD-10-CM

## 2011-04-26 DIAGNOSIS — K5900 Constipation, unspecified: Secondary | ICD-10-CM | POA: Insufficient documentation

## 2011-04-26 MED ORDER — ERGOCALCIFEROL 1.25 MG (50000 UT) PO CAPS: 50000.0000 [IU] | ORAL_CAPSULE | ORAL | Status: DC

## 2011-04-26 NOTE — Progress Notes (Signed)
  Subjective:    Patient ID: [name], male    DOB: 04/19/1965, 46 y.o.   MRN: [id number]  HPI   This 46 y.o. [place] male was diagnosed with a Vit D def on 04/12/11 at [facility]. He is having  fatigue, leg pains and muscle cramps and weakness. This is a chronic problem. He recently had  a nephrectomy in January performed by Dr. Lowella Bandy. He has some persistent GI complaints of gas  and constipation for which he has been taking FiberCon.    Review of Systems Noncontributory     Objective:   Physical Exam  Nursing note and vitals reviewed. Constitutional: He is oriented to person, place, and time. He appears well-developed and well-nourished. No distress.  HENT:  Head: Normocephalic and atraumatic.  Cardiovascular: Normal rate.   Pulmonary/Chest: He is in respiratory distress.  Abdominal: He exhibits distension.  Neurological: He is alert and oriented to person, place, and time.          Assessment & Plan:   1. Vitamin d deficiency  ergocalciferol (DRISDOL) 50000 UNITS capsule Take 1 capsule once a week foe 12 weeks then get OTC supplement  1000-2000 IU and take it daily. Print info re: Vit D Def given to pt  RTC 3 months for CPE and fasting labs  Advised pt about better nutrition to improve his Lipid profile ( esp. TGs) and alleviate constipation

## 2011-04-26 NOTE — Patient Instructions (Addendum)
Constipation in Adults Constipation is having fewer than 2 bowel movements per week. Usually, the stools are hard. As we grow older, constipation is more common. If you try to fix constipation with laxatives, the problem may get worse. This is because laxatives taken over a long period of time make the colon muscles weaker. A low-fiber diet, not taking in enough fluids, and taking some medicines may make these problems worse. MEDICATIONS THAT MAY CAUSE CONSTIPATION  Water pills (diuretics).   Calcium channel blockers (used to control blood pressure and for the heart).   Certain pain medicines (narcotics).   Anticholinergics.   Anti-inflammatory agents.   Antacids that contain aluminum.  DISEASES THAT CONTRIBUTE TO CONSTIPATION  Diabetes.   Parkinson's disease.   Dementia.   Stroke.   Depression.   Illnesses that cause problems with salt and water metabolism.  HOME CARE INSTRUCTIONS   Constipation is usually best cared for without medicines. Increasing dietary fiber and eating more fruits and vegetables is the best way to manage constipation.   Slowly increase fiber intake to 25 to 38 grams per day. Whole grains, fruits, vegetables, and legumes are good sources of fiber. A dietitian can further help you incorporate high-fiber foods into your diet.   Drink enough water and fluids to keep your urine clear or pale yellow.   A fiber supplement may be added to your diet if you cannot get enough fiber from foods.   Increasing your activities also helps improve regularity.   Suppositories, as suggested by your caregiver, will also help. If you are using antacids, such as aluminum or calcium containing products, it will be helpful to switch to products containing magnesium if your caregiver says it is okay.   If you have been given a liquid injection (enema) today, this is only a temporary measure. It should not be relied on for treatment of longstanding (chronic) constipation.    Stronger measures, such as magnesium sulfate, should be avoided if possible. This may cause uncontrollable diarrhea. Using magnesium sulfate may not allow you time to make it to the bathroom.  SEEK IMMEDIATE MEDICAL CARE IF:   There is bright red blood in the stool.   The constipation stays for more than 4 days.   There is belly (abdominal) or rectal pain.   You do not seem to be getting better.   You have any questions or concerns.  MAKE SURE YOU:   Understand these instructions.   Will watch your condition.   Will get help right away if you are not doing well or get worse.  Document Released: 10/30/2003 Document Revised: 01/20/2011 Document Reviewed: 01/04/2011 [facility] Patient Information 2012 [place], [place]      .Vitamin D Deficiency  Not having enough vitamin D is called a deficiency. Your body needs this vitamin to keep your bones strong and healthy. Having too little of it can make your bones soft or can cause other health problems.  HOME CARE  Take all vitamins, herbs, or nutrition drinks (supplements) as told by your doctor.   Have your blood tested 2 months after taking vitamins, herbs, or nutrition drinks.   Eat foods that have vitamin D. This includes:   Dairy products, cereals, or juices with added vitamin D. Check the label.   Fatty fish like salmon or trout.   Eggs.   Oysters.   Go outside for 10 to 15 minutes when the sun is shining. Do this 3 times a week. Do not do this if you  have skin cancer.   Do not use tanning beds.   Stay at a healthy weight. Lose weight if needed.   Keep all doctor visits as told.  GET HELP IF:  You have questions.   You continue to have problems.   You feel sick to your stomach (nauseous) or throw up (vomit).   You cannot go poop (constipated).   You feel confused.   You have severe belly (abdominal) or back pain.  MAKE SURE YOU:  Understand these instructions.   Will watch your condition.   Will get  help right away if you are not doing well or get worse.  Document Released: 01/20/2011 Document Reviewed: 01/18/2011 [employer] Patient Information 2012 [place].    You have been prescribed a VITAMIN D supplement-   50,000 IU   Take 1 capsule once a week for the next 3 months. You will return for physical in 3 months. We will  recheck your labs at that visit.

## 2011-07-27 ENCOUNTER — Encounter: Payer: Self-pay | Admitting: Family Medicine

## 2011-07-27 ENCOUNTER — Ambulatory Visit (INDEPENDENT_AMBULATORY_CARE_PROVIDER_SITE_OTHER): Payer: BC Managed Care – PPO | Admitting: Family Medicine

## 2011-07-27 ENCOUNTER — Ambulatory Visit: Payer: BC Managed Care – PPO

## 2011-07-27 VITALS — BP 136/87 | HR 87 | Temp 98.5°F | Resp 16 | Ht 69.0 in | Wt 223.6 lb

## 2011-07-27 DIAGNOSIS — M549 Dorsalgia, unspecified: Secondary | ICD-10-CM

## 2011-07-27 DIAGNOSIS — E781 Pure hyperglyceridemia: Secondary | ICD-10-CM

## 2011-07-27 DIAGNOSIS — R030 Elevated blood-pressure reading, without diagnosis of hypertension: Secondary | ICD-10-CM

## 2011-07-27 DIAGNOSIS — E559 Vitamin D deficiency, unspecified: Secondary | ICD-10-CM

## 2011-07-27 NOTE — Patient Instructions (Addendum)
Hypertension As your heart beats, it forces blood through your arteries. This force is your blood pressure. If the pressure is too high, it is called hypertension (HTN) or high blood pressure. HTN is dangerous because you may have it and not know it. High blood pressure may mean that your heart has to work harder to pump blood. Your arteries may be narrow or stiff. The extra work puts you at risk for heart disease, stroke, and other problems.  Blood pressure consists of two numbers, a higher number over a lower, 110/72, for example. It is stated as "110 over 72." The ideal is below 120 for the top number (systolic) and under 80 for the bottom (diastolic). Write down your blood pressure today. You should pay close attention to your blood pressure if you have certain conditions such as:  Heart failure.   Prior heart attack.   Diabetes   Chronic kidney disease.   Prior stroke.   Multiple risk factors for heart disease.  To see if you have HTN, your blood pressure should be measured while you are seated with your arm held at the level of the heart. It should be measured at least twice. A one-time elevated blood pressure reading (especially in the Emergency Department) does not mean that you need treatment. There may be conditions in which the blood pressure is different between your right and left arms. It is important to see your caregiver soon for a recheck. Most people have essential hypertension which means that there is not a specific cause. This type of high blood pressure may be lowered by changing lifestyle factors such as:  Stress.   Smoking.   Lack of exercise.   Excessive weight.   Drug/tobacco/alcohol use.   Eating less salt.  Most people do not have symptoms from high blood pressure until it has caused damage to the body. Effective treatment can often prevent, delay or reduce that damage. TREATMENT  When a cause has been identified, treatment for high blood pressure is  directed at the cause. There are a large number of medications to treat HTN. These fall into several categories, and your caregiver will help you select the medicines that are best for you. Medications may have side effects. You should review side effects with your caregiver. If your blood pressure stays high after you have made lifestyle changes or started on medicines,   Your medication(s) may need to be changed.   Other problems may need to be addressed.   Be certain you understand your prescriptions, and know how and when to take your medicine.   Be sure to follow up with your caregiver within the time frame advised (usually within two weeks) to have your blood pressure rechecked and to review your medications.   If you are taking more than one medicine to lower your blood pressure, make sure you know how and at what times they should be taken. Taking two medicines at the same time can result in blood pressure that is too low.  SEEK IMMEDIATE MEDICAL CARE IF:  You develop a severe headache, blurred or changing vision, or confusion.   You have unusual weakness or numbness, or a faint feeling.   You have severe chest or abdominal pain, vomiting, or breathing problems.  MAKE SURE YOU:   Understand these instructions.   Will watch your condition.   Will get help right away if you are not doing well or get worse.  Document Released: 01/31/2005 Document Revised: 01/20/2011 Document Reviewed:   09/21/2007 [employer] Patient Information 2012 [employer]    Lumbosacral Strain Lumbosacral strain is one of the most common causes of back pain. There are many causes of back pain. Most are not serious conditions. CAUSES  Your backbone (spinal column) is made up of 24 main vertebral bodies, the sacrum, and the coccyx. These are held together by muscles and tough, fibrous tissue (ligaments). Nerve roots pass through the openings between the vertebrae. A sudden move or injury to the back may  cause injury to, or pressure on, these nerves. This may result in localized back pain or pain movement (radiation) into the buttocks, down the leg, and into the foot. Sharp, shooting pain from the buttock down the back of the leg (sciatica) is frequently associated with a ruptured (herniated) disk. Pain may be caused by muscle spasm alone. Your caregiver can often find the cause of your pain by the details of your symptoms and an exam. In some cases, you may need tests (such as X-rays). Your caregiver will work with you to decide if any tests are needed based on your specific exam. HOME CARE INSTRUCTIONS  Avoid an underactive lifestyle. Active exercise, as directed by your caregiver, is your greatest weapon against back pain.  Avoid hard physical activities (tennis, racquetball, waterskiing) if you are not in proper physical condition for it. This may aggravate or create problems.  If you have a back problem, avoid sports requiring sudden body movements. Swimming and walking are generally safer activities.  Maintain good posture.  Avoid becoming overweight (obese).  Use bed rest for only the most extreme, sudden (acute) episode. Your caregiver will help you determine how much bed rest is necessary.  For acute conditions, you may put ice on the injured area.  Put ice in a plastic bag.  Place a towel between your skin and the bag.  Leave the ice on for 15 to 20 minutes at a time, every 2 hours, or as needed.  After you are improved and more active, it may help to apply heat for 30 minutes before activities.  See your caregiver if you are having pain that lasts longer than expected. Your caregiver can advise appropriate exercises or therapy if needed. With conditioning, most back problems can be avoided. SEEK IMMEDIATE MEDICAL CARE IF:  You have numbness, tingling, weakness, or problems with the use of your arms or legs.  You experience severe back pain not relieved with medicines.  There is a change  in bowel or bladder control.  You have increasing pain in any area of the body, including your belly (abdomen).  You notice shortness of breath, dizziness, or feel faint.  You feel sick to your stomach (nauseous), are throwing up (vomiting), or become sweaty.  You notice discoloration of your toes or legs, or your feet get very cold.  Your back pain is getting worse.  You have a fever.  MAKE SURE YOU:  Understand these instructions.  Will watch your condition.  Will get help right away if you are not doing well or get worse.  Document Released: 11/10/2004 Document Revised: 01/20/2011 Document Reviewed: 05/02/2008 [facility] Patient Information 2012 [place]      .Back Pain, Adult Low back pain is very common. About 1 in 5 people have back pain.The cause of low back pain is rarely dangerous. The pain often gets better over time.About half of people with a sudden onset of back pain feel better in just 2 weeks. About 8 in 10 people feel  better by 6 weeks.  CAUSES Some common causes of back pain include: Strain of the muscles or ligaments supporting the spine.  Wear and tear (degeneration) of the spinal discs.  Arthritis.  Direct injury to the back.  DIAGNOSIS Most of the time, the direct cause of low back pain is not known.However, back pain can be treated effectively even when the exact cause of the pain is unknown.Answering your caregiver's questions about your overall health and symptoms is one of the most accurate ways to make sure the cause of your pain is not dangerous. If your caregiver needs more information, he or she may order lab work or imaging tests (X-rays or MRIs).However, even if imaging tests show changes in your back, this usually does not require surgery. HOME CARE INSTRUCTIONS For many people, back pain returns.Since low back pain is rarely dangerous, it is often a condition that people can learn to [facility] their own.  Remain active. It is stressful on the  back to sit or stand in one place. Do not sit, drive, or stand in one place for more than 30 minutes at a time. Take short walks on level surfaces as soon as pain allows.Try to increase the length of time you walk each day.  Do not stay in bed.Resting more than 1 or 2 days can delay your recovery.  Do not avoid exercise or work.Your body is made to move.It is not dangerous to be active, even though your back may hurt.Your back will likely heal faster if you return to being active before your pain is gone.  Pay attention to your body when you bend and lift. Many people have less discomfortwhen lifting if they bend their knees, keep the load close to their bodies,and avoid twisting. Often, the most comfortable positions are those that put less stress on your recovering back.  Find a comfortable position to sleep. Use a firm mattress and lie on your side with your knees slightly bent. If you lie on your back, put a pillow under your knees.  Only take over-the-counter or prescription medicines as directed by your caregiver. Over-the-counter medicines to reduce pain and inflammation are often the most helpful.Your caregiver may prescribe muscle relaxant drugs.These medicines help dull your pain so you can more quickly return to your normal activities and healthy exercise.  Put ice on the injured area.  Put ice in a plastic bag.  Place a towel between your skin and the bag.  Leave the ice on for 15 to 20 minutes, 3 to 4 times a day for the first 2 to 3 days. After that, ice and heat may be alternated to reduce pain and spasms.  Ask your caregiver about trying back exercises and gentle massage. This may be of some benefit.  Avoid feeling anxious or stressed.Stress increases muscle tension and can worsen back pain.It is important to recognize when you are anxious or stressed and learn ways to manage it.Exercise is a great option.  SEEK MEDICAL CARE IF: You have pain that is not relieved with rest  or medicine.  You have pain that does not improve in 1 week.  You have new symptoms.  You are generally not feeling well.  SEEK IMMEDIATE MEDICAL CARE IF:  You have pain that radiates from your back into your legs.  You develop new bowel or bladder control problems.  You have unusual weakness or numbness in your arms or legs.  You develop nausea or vomiting.  You develop abdominal pain.  You feel faint.  Document Released: 01/31/2005 Document Revised: 01/20/2011 Document Reviewed: 06/21/2010 [facility] Patient Information 2012 [place].

## 2011-07-27 NOTE — Progress Notes (Signed)
  Subjective:    Patient ID: [name], male    DOB: [birth year], 46 y.o.   MRN: [id number]  HPI  Pt still has c/o fatigue and leg weakness with tired back muscles. He finished taking Vit D3 supplement  but has not gotten OTC supplement. 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  He also has right hand numbness upon awakening, 1-2 x/week. He is taking Fibercon and repeatedly asks  about lab results (which were reviewed with prior to this appt in addition to results being mailed to him).  He states that he was diagnosed with anemia after recent surgery and "noone has checked it again". He was  reminded that labs were checked in February at which time he was not found to be anemic.  He c/o LBP for several weeks/ months; no neurologic deficits (numbness or tingling in legs). "Back feels  weak".    Review of Systems  Constitutional: Positive for activity change and fatigue. Negative for fever and chills.  HENT: Negative.   Respiratory: Negative for chest tightness and shortness of breath.   Cardiovascular: Negative.   Gastrointestinal: Positive for abdominal distention. Negative for abdominal pain, diarrhea and constipation.  Genitourinary: Negative.   Musculoskeletal: Positive for back pain and arthralgias.  Neurological: Negative.   Psychiatric/Behavioral: Negative.        Objective:   Physical Exam  Vitals reviewed. Constitutional: He is oriented to person, place, and time. He appears well-developed and well-nourished. No distress.  HENT:  Head: Normocephalic and atraumatic.  Eyes: Conjunctivae and EOM are normal. No scleral icterus.  Neck: Normal range of motion. Neck supple. No thyromegaly present.  Cardiovascular: Normal rate, regular rhythm and normal heart sounds.   Pulmonary/Chest: Effort normal and breath sounds normal. No respiratory distress.  Abdominal: He exhibits distension. He exhibits no mass. There is no tenderness. There is no guarding.       2  well- healed abd scars with poor abdominal muscle tone  Musculoskeletal: Normal range of motion. He exhibits tenderness. He exhibits no edema.  Neurological: He is alert and oriented to person, place, and time. No cranial nerve deficit. He exhibits normal muscle tone. Coordination normal.       Spine: lumbar area tenderness with paravertebral spasms  Skin: Skin is warm and dry.    [facility] reading (PRIMARY) by  Dr. Leward Quan: No fractures or disc disease; well preserved disc spaces.       Assessment & Plan:   1. Unspecified vitamin D deficiency  Vitamin D, 25-hydroxy  2. Back pain due to poor core muscle strength and absence of abd muscle tone DG Lumbar Spine 2-3 Views  3. Hypertriglyceridemia  Lipid panel  4. Elevated blood-pressure reading without diagnosis of hypertension  Continue to monitor; dietary changes and weight loss encouraged

## 2011-07-28 ENCOUNTER — Encounter: Payer: Self-pay | Admitting: Family Medicine

## 2011-07-28 LAB — LIPID PANEL
Cholesterol: 324 mg/dL — ABNORMAL HIGH (ref 0–200)
Total CHOL/HDL Ratio: 11.6 Ratio
Triglycerides: 1327 mg/dL — ABNORMAL HIGH (ref <150)

## 2011-07-31 NOTE — Progress Notes (Signed)
Quick Note:  Please call pt and advise that the following labs are abnormal... Vitamin D still a little low. Continue all current medications that you are taking. Need to work on weight loss.  Total lipids are very high. He will need to come in too discuss because he does not have a good understanding of [place] language. ______

## 2011-08-03 ENCOUNTER — Ambulatory Visit (INDEPENDENT_AMBULATORY_CARE_PROVIDER_SITE_OTHER): Payer: BC Managed Care – PPO | Admitting: Physician Assistant

## 2011-08-03 VITALS — BP 137/82 | HR 84 | Temp 98.2°F | Resp 16 | Ht 69.0 in | Wt 226.0 lb

## 2011-08-03 DIAGNOSIS — E559 Vitamin D deficiency, unspecified: Secondary | ICD-10-CM

## 2011-08-03 DIAGNOSIS — E785 Hyperlipidemia, unspecified: Secondary | ICD-10-CM

## 2011-08-03 MED ORDER — ERGOCALCIFEROL 1.25 MG (50000 UT) PO CAPS: 50000.0000 [IU] | ORAL_CAPSULE | ORAL | Status: DC

## 2011-08-03 MED ORDER — SIMETHICONE 80 MG PO CHEW: 80.0000 mg | CHEWABLE_TABLET | Freq: Four times a day (QID) | ORAL | Status: DC | PRN

## 2011-08-03 MED ORDER — ATORVASTATIN CALCIUM 20 MG PO TABS: 20.0000 mg | ORAL_TABLET | Freq: Every day | ORAL | Status: DC

## 2011-08-03 NOTE — Progress Notes (Signed)
  Subjective:    Patient ID: [name], male    DOB: 1965-08-24, 46 y.o.   MRN: [id number]  HPI Mr. Timpe returns to discuss lab results.  Severely elevated lipids, with TC 324, TG 1327, HDL 28.  His vitamin  D was also low at 27 despite weekly replacement with 50,000 IU weekly for one month. We spent 15 minutes today discussing his diagnosis, the expected treatment outcomes, and his need for follow up.  He repeats the plan, and appears to understand his diagnoses.     Review of Systems Review of Systems  Constitutional: Positive for activity change and fatigue. Negative for fever and chills.  HENT: Negative.  Respiratory: Negative for chest tightness and shortness of breath.  Cardiovascular: Negative.  Gastrointestinal: Positive for abdominal distention. Negative for abdominal pain, diarrhea and constipation.  Genitourinary: Negative.  Musculoskeletal: Positive for back pain and arthralgias.  Neurological: Negative.  Psychiatric/Behavioral: Negative.   Objective:   Physical Exam Not performed.  Had recent exam 07/27/2011 and is here for lab review only.   Results for orders placed in visit on 07/27/11  VITAMIN D 25 HYDROXY      Component Value Range   Vit D, 25-Hydroxy 27 (*) 30 - 89 ng/mL  LIPID PANEL      Component Value Range   Cholesterol 324 (*) 0 - 200 mg/dL   Triglycerides 1327 (*) <150 mg/dL   HDL 28 (*) >39 mg/dL   Total CHOL/HDL Ratio 11.6     VLDL NOT CALC  0 - 40 mg/dL   LDL Cholesterol NOT CALC  0 - 99 mg/dL       Assessment & Plan:  Hyperlipidemia, severe.   Start atorvastatin 20mg  qHS.  Expect he will need titration to 80mg , but d/t his comorbid complaints of vague myalgias and arthralgias, will start at a lower dose to monitor for side effects.  F/u in eight weeks for repeat labwork with Dr. Leward Quan. Vitamin D deficiency.  Inadequate response on four weeks of vitamin D therapy.  Increase dosage to 50,000IU twice weekly for eight weeks and f/u at that time for  repeat labwork.

## 2011-09-06 ENCOUNTER — Other Ambulatory Visit: Payer: Self-pay

## 2011-09-22 ENCOUNTER — Encounter (HOSPITAL_COMMUNITY): Payer: Self-pay | Admitting: Emergency Medicine

## 2011-09-22 ENCOUNTER — Emergency Department (HOSPITAL_COMMUNITY): Payer: BC Managed Care – PPO

## 2011-09-22 ENCOUNTER — Emergency Department (HOSPITAL_COMMUNITY)
Admission: EM | Admit: 2011-09-22 | Discharge: 2011-09-22 | Disposition: A | Discharge status: Home / Self Care | Payer: BC Managed Care – PPO | Attending: Emergency Medicine | Admitting: Emergency Medicine

## 2011-09-22 DIAGNOSIS — M79609 Pain in unspecified limb: Secondary | ICD-10-CM | POA: Insufficient documentation

## 2011-09-22 DIAGNOSIS — M5430 Sciatica, unspecified side: Secondary | ICD-10-CM

## 2011-09-22 DIAGNOSIS — I10 Essential (primary) hypertension: Secondary | ICD-10-CM | POA: Insufficient documentation

## 2011-09-22 DIAGNOSIS — E7800 Pure hypercholesterolemia, unspecified: Secondary | ICD-10-CM | POA: Insufficient documentation

## 2011-09-22 DIAGNOSIS — D649 Anemia, unspecified: Secondary | ICD-10-CM | POA: Insufficient documentation

## 2011-09-22 DIAGNOSIS — E785 Hyperlipidemia, unspecified: Secondary | ICD-10-CM | POA: Insufficient documentation

## 2011-09-22 MED ORDER — CYCLOBENZAPRINE HCL 10 MG PO TABS: 10.0000 mg | ORAL_TABLET | Freq: Three times a day (TID) | ORAL | Status: AC | PRN

## 2011-09-22 MED ORDER — PREDNISONE 20 MG PO TABS: 60.0000 mg | ORAL_TABLET | Freq: Every day | ORAL | Status: AC

## 2011-09-22 MED ORDER — OXYCODONE-ACETAMINOPHEN 5-325 MG PO TABS: 1.0000 | ORAL_TABLET | Freq: Four times a day (QID) | ORAL | Status: AC | PRN

## 2011-09-22 MED ORDER — KETOROLAC TROMETHAMINE 60 MG/2ML IM SOLN: 60.0000 mg | Freq: Once | INTRAMUSCULAR | Status: DC

## 2011-09-22 NOTE — ED Provider Notes (Signed)
History     CSN: [id number]  Arrival date & time 09/22/11  1747   First MD Initiated Contact with Patient 09/22/11 1906      Chief Complaint  Patient presents with  . Leg Pain    (Consider location/radiation/quality/duration/timing/severity/associated sxs/prior treatment) Patient is a 46 y.o. male presenting with leg pain. The history is provided by the patient.  Leg Pain  The incident occurred 2 days ago. There was no injury mechanism. The pain is present in the left leg. The quality of the pain is described as sharp. The pain is severe. The pain has been constant since onset. Pertinent negatives include no numbness and no loss of sensation. He reports no foreign bodies present. The symptoms are aggravated by activity. Treatments tried: camphor. The treatment provided no relief.    Past Medical History  Diagnosis Date  . Kidney stones 2007  . PUD (peptic ulcer disease)     Has had unspecified surgery for this  . Hyperlipidemia   . Elevated cholesterol   . Hypertension     no current bp meds for last 3 months  . Anemia     Past Surgical History  Procedure Date  . Surgery for ulcers 1990  . Middle ear surgery 2005    both ears  . Nephrectomy 02/18/2011    Procedure: NEPHRECTOMY;  Surgeon: Hanley Ben, MD;  Location: [facility];  Service: Urology;  Laterality: Right;    Family History  Problem Relation Age of Onset  . Hyperlipidemia Father     History  Substance Use Topics  . Smoking status: Never Smoker   . Smokeless tobacco: Never Used  . Alcohol Use: No      Review of Systems  Neurological: Negative for numbness.  All other systems reviewed and are negative.    Allergies  Beef-derived products and Pork-derived products  Home Medications   Current Outpatient Rx  Name Route Sig Dispense Refill  . ACETAMINOPHEN 325 MG PO TABS Oral Take 325 mg by mouth 2 (two) times daily as needed. For pain    . ATORVASTATIN CALCIUM 20 MG PO TABS Oral Take 20 mg by  mouth every evening.    Marland Kitchen TIGER BALM EXTRA STRENGTH 11-10 % EX OINT Apply externally Apply 1 application topically daily as needed. For pain. Apply to left leg.    Marland Kitchen ERGOCALCIFEROL 50000 UNITS PO CAPS Oral Take 50,000 Units by mouth 2 (two) times a week.    Marland Kitchen HYDROCODONE-ACETAMINOPHEN 5-325 MG PO TABS Oral Take 1 tablet by mouth every 4 (four) hours as needed. For pain    . SALONPAS PAIN RELIEF PATCH EX Apply externally Apply 1 patch topically daily as needed. For pain. Applies to left leg.    Marland Kitchen CALCIUM POLYCARBOPHIL 625 MG PO TABS Oral Take 625 mg by mouth at bedtime.      BP 147/88  Pulse 89  Temp 97.8 F (36.6 C) (Oral)  Resp 18  SpO2 99%  Physical Exam  Nursing note and vitals reviewed. Constitutional: He appears well-developed and well-nourished.  HENT:  Head: Normocephalic and atraumatic.  Right Ear: External ear normal.  Left Ear: External ear normal.  Nose: Nose normal.  Eyes: Conjunctivae and EOM are normal.  Neck: Neck supple. No tracheal deviation present.  Pulmonary/Chest: Effort normal. No stridor. No respiratory distress.  Musculoskeletal: He exhibits no edema and no tenderness.       Lumbar back: He exhibits decreased range of motion, tenderness, pain and spasm. He exhibits no swelling  and no edema.  Neurological: He is alert. He is not disoriented. No cranial nerve deficit or sensory deficit. He exhibits normal muscle tone. Coordination normal.  Reflex Scores:      Patellar reflexes are 2+ on the right side and 2+ on the left side.      Achilles reflexes are 2+ on the right side and 2+ on the left side. Skin: Skin is warm and dry. No rash noted. He is not diaphoretic. No erythema.  Psychiatric: He has a normal mood and affect. His behavior is normal. Thought content normal.    ED Course  Procedures (including critical care time)  Labs Reviewed - No data to display No results found.     MDM  The patient's symptoms are suggestive of sciatica. He has normal  strength and sensation bilaterally.  At this time there does not appear to be any evidence of an acute emergency medical condition and the patient appears stable for discharge with appropriate outpatient follow up.Kathalene Frames, MD 09/22/11 [phone]

## 2011-09-22 NOTE — ED Notes (Addendum)
Pt reports 2 days ago started having a little bit of pain in L leg; and has gotten worse since then--pt reports has tried meds at home with no relief , pt does appear in pain at triage; no swelling or redness noted to leg; pt has + DP pulse; ambulatory with limp; pt reports has had pain to leg in past but not as severe

## 2011-09-26 ENCOUNTER — Ambulatory Visit (INDEPENDENT_AMBULATORY_CARE_PROVIDER_SITE_OTHER): Payer: BC Managed Care – PPO | Admitting: Physician Assistant

## 2011-09-26 VITALS — BP 130/84 | HR 77 | Temp 98.3°F | Resp 18 | Ht 69.0 in | Wt 226.6 lb

## 2011-09-26 DIAGNOSIS — M545 Low back pain, unspecified: Secondary | ICD-10-CM

## 2011-09-26 DIAGNOSIS — E7800 Pure hypercholesterolemia, unspecified: Secondary | ICD-10-CM

## 2011-09-26 DIAGNOSIS — E559 Vitamin D deficiency, unspecified: Secondary | ICD-10-CM

## 2011-09-26 DIAGNOSIS — M79605 Pain in left leg: Secondary | ICD-10-CM

## 2011-09-26 LAB — COMPREHENSIVE METABOLIC PANEL
ALT: 23 U/L (ref 0–53)
CO2: 22 mEq/L (ref 19–32)
Calcium: 9.8 mg/dL (ref 8.4–10.5)
Chloride: 105 mEq/L (ref 96–112)
Sodium: 138 mEq/L (ref 135–145)
Total Protein: 6.9 g/dL (ref 6.0–8.3)

## 2011-09-26 LAB — POCT URINALYSIS DIPSTICK
Blood, UA: NEGATIVE
Protein, UA: 30
Spec Grav, UA: 1.02
Urobilinogen, UA: 0.2

## 2011-09-26 LAB — LIPID PANEL: LDL Cholesterol: 94 mg/dL (ref 0–99)

## 2011-09-26 MED ORDER — HYDROCODONE-ACETAMINOPHEN 5-325 MG PO TABS: 1.0000 | ORAL_TABLET | Freq: Four times a day (QID) | ORAL | Status: AC | PRN

## 2011-09-26 MED ORDER — CYCLOBENZAPRINE HCL 5 MG PO TABS: ORAL_TABLET | ORAL | Status: DC

## 2011-09-26 MED ORDER — PREDNISONE 10 MG PO TABS: ORAL_TABLET | ORAL | Status: DC

## 2011-09-26 NOTE — Progress Notes (Signed)
Subjective:    Patient ID: [name], male    DOB: [birth year], 46 y.o.   MRN: [id number]  HPI  Pt presents to clinic for several issues 1- needs Vit D checked - he has been taking his medications but knows it is time to be checked 2- taking Lipitor and having no problems but also here for lab check 3- has had increased in back pain with L leg pain radiation - was seen in the ED 8/8 for pain and was treated and now pain has decreased from 10/10 to 7/10 but he is worried about the fact that the bottom of his foot is numb and leg feels weak - he has had similar pain in his back for about 6 months but over the past week the pain has gotten significantly worse and now he is having trouble walking because of the pain and numbness on the bottom of his foot.  He has used OTC creams without any help and then medications that the ED put him on 5 days ago but he has run out of those and needs to know the next step.  He has not had an acute injury to cause an increase in pain nor did he have an injury 6 months ago at the onset of this pain. 4- had a nephrectomy and only has his R kidney and wants to make sure his kidney is functioning ok  Review of Systems  Musculoskeletal: Positive for back pain and gait problem.  Neurological: Positive for weakness and numbness.       Objective:   Physical Exam  Vitals reviewed. Constitutional: He is oriented to person, place, and time. He appears well-developed and well-nourished.  HENT:  Head: Normocephalic and atraumatic.  Right Ear: External ear normal.  Left Ear: External ear normal.  Eyes: Conjunctivae are normal.  Cardiovascular: Normal rate, regular rhythm and normal heart sounds.   Pulmonary/Chest: Effort normal and breath sounds normal.  Musculoskeletal:       Lumbar back: He exhibits decreased range of motion, pain (on the left side) and spasm (on the Left side). He exhibits no tenderness.  Neurological: He is alert and oriented to person, place, and  time. He displays abnormal reflex. A sensory deficit is present.  Reflex Scores:      Patellar reflexes are 2+ on the right side and 2+ on the left side.      Achilles reflexes are 2+ on the right side and 0 on the left side.      Slight decrease in strength in L hamstring testing compared to R side- ?related to pain. Pt has dulled sensation on the L lateral leg and foot.  Normal sensation of the great L toe  Skin: Skin is warm and dry.  Psychiatric: He has a normal mood and affect. His behavior is normal. Judgment and thought content normal.      Results for orders placed in visit on 09/26/11  POCT URINALYSIS DIPSTICK      Component Value Range   Color, UA yellow     Clarity, UA clear     Glucose, UA neg     Bilirubin, UA neg     Ketones, UA neg     Spec Grav, UA 1.020     Blood, UA neg     pH, UA 5.5     Protein, UA 30     Urobilinogen, UA 0.2     Nitrite, UA neg     Leukocytes, UA Negative  Assessment & Plan:   1. Lumbar pain with radiation down left leg  MR Lumbar Spine Wo Contrast, cyclobenzaprine (FLEXERIL) 5 MG tablet, predniSONE (DELTASONE) 10 MG tablet, HYDROcodone-acetaminophen (NORCO/VICODIN) 5-325 MG per tablet, POCT urinalysis dipstick  2. Vitamin d deficiency  Vitamin D, 25-hydroxy  3. Hypercholesteremia  Lipid panel, Comprehensive metabolic panel, CK   1- worried that there is nerve involvement esp since on 8/8 nl reflex and today achilles is absent.  Will continue meds while waiting on MRI 2 - recheck Vit D and if normal will start Vit D 2000U daily 3- recheck chol and labs for medication expect to have to increase his dose, he is fasting today.  Pt to RTC/ED if any changes in his leg pain - d/w pt warnings.  He understands and agrees with the above.

## 2011-09-27 ENCOUNTER — Ambulatory Visit
Admission: RE | Admit: 2011-09-27 | Discharge: 2011-09-27 | Disposition: A | Discharge status: Home / Self Care | Payer: BC Managed Care – PPO | Source: Ambulatory Visit | Attending: Physician Assistant | Admitting: Physician Assistant

## 2011-09-27 ENCOUNTER — Telehealth: Payer: Self-pay | Admitting: Radiology

## 2011-09-27 DIAGNOSIS — M79605 Pain in left leg: Secondary | ICD-10-CM

## 2011-09-27 LAB — VITAMIN D 25 HYDROXY (VIT D DEFICIENCY, FRACTURES): Vit D, 25-Hydroxy: 41 ng/mL (ref 30–89)

## 2011-09-27 MED ORDER — ATORVASTATIN CALCIUM 20 MG PO TABS
20.0000 mg | ORAL_TABLET | Freq: Every evening | ORAL | Status: DC
Start: 2011-09-27 — End: 2012-01-02

## 2011-09-27 MED ORDER — VITAMIN D 50 MCG (2000 UT) PO TABS: 2000.0000 [IU] | ORAL_TABLET | Freq: Every day | ORAL | Status: DC

## 2011-09-27 NOTE — Telephone Encounter (Signed)
I have contacted patient and he is aware he has a disk rupture and he needs to see surgeon, he is going there now and [place] at Dr De Blanch office is aware.

## 2011-09-27 NOTE — Addendum Note (Signed)
Addended by: Mancel Bale on: 09/27/2011 08:28 AM   Modules accepted: Orders

## 2011-09-27 NOTE — Telephone Encounter (Signed)
I have called [facility] and Lorriane Shire is going to get the information about Neurosurgical referral to Dr Saintclair Halsted, who is on call and call me back about this urgent referral.

## 2011-09-27 NOTE — Addendum Note (Signed)
Addended by: Mancel Bale on: 09/27/2011 10:54 AM   Modules accepted: Orders

## 2011-09-27 NOTE — Telephone Encounter (Signed)
I have spoken to [name] at [facility] and they can get patient in today to see Dr Hal Neer at 230, I am going to advise patient of MRI results and appt, I have left message for him to call me back, if he calls back I need to speak to him right away.

## 2011-10-06 ENCOUNTER — Ambulatory Visit: Payer: BC Managed Care – PPO | Admitting: Family Medicine

## 2011-10-11 ENCOUNTER — Other Ambulatory Visit: Payer: Self-pay

## 2011-10-11 DIAGNOSIS — E559 Vitamin D deficiency, unspecified: Secondary | ICD-10-CM

## 2011-10-11 MED ORDER — ERGOCALCIFEROL 1.25 MG (50000 UT) PO CAPS: 50000.0000 [IU] | ORAL_CAPSULE | ORAL | Status: DC

## 2011-11-02 ENCOUNTER — Telehealth: Payer: Self-pay

## 2011-11-02 NOTE — Telephone Encounter (Signed)
PATIENT CAME IN TODAY SAYING HE RECEIVED A PHONE CALL FROM [facility] ABOUT HEART SPECIALIST APPOINTMENT (FOR A REFERRAL). HE WOULD LIKE DIRECTIONS AND WHEN THE APPOINTMENT IS. IT'S EXTREMELY DIFFICULT TO UNDERSTAND BECAUSE I THINK THERE IS CONFUSION THAT OUR APPOINTMENT CENTER IS WHERE HE WILL MEET A HEART SPECIALIST? PLEASE CLARIFY IF THIS IS A REFERRAL OR IF NOTHING NEEDS TO BE DONE. THANK YOU!

## 2011-11-02 NOTE — Telephone Encounter (Signed)
Patient has been referred to neurosurgery. Please call patient to clarify with him that he understands this and where and when to go.

## 2011-11-03 ENCOUNTER — Ambulatory Visit (INDEPENDENT_AMBULATORY_CARE_PROVIDER_SITE_OTHER): Payer: BC Managed Care – PPO | Admitting: Family Medicine

## 2011-11-03 VITALS — BP 151/82 | HR 101 | Temp 98.1°F | Resp 18 | Ht 69.5 in | Wt 227.0 lb

## 2011-11-03 DIAGNOSIS — I10 Essential (primary) hypertension: Secondary | ICD-10-CM

## 2011-11-03 DIAGNOSIS — M549 Dorsalgia, unspecified: Secondary | ICD-10-CM

## 2011-11-03 MED ORDER — AMLODIPINE BESYLATE 5 MG PO TABS: 5.0000 mg | ORAL_TABLET | Freq: Every day | ORAL | Status: DC

## 2011-11-03 NOTE — Patient Instructions (Signed)
It was good to see you today.  Your blood pressure is up again today.  I think it would be a good idea to start you on blood pressure medication.   The medicine is called Amlodipine.  You should take this one pill a day.

## 2011-11-03 NOTE — Progress Notes (Signed)
Patient ID: [name], male   DOB: 06-09-1965, 46 y.o.   MRN: [id number] [name] is a 46 y.o. male who presents to Urgent Care today for high blood pressure:  1.  Hypertension:  Patient had trouble with high blood pressure prior to nephrectomy in Jan 2012.  No problems with this until recently.  Not currently on any medication.   Not checking it regularly.  No HA, CP, dizziness, shortness of breath, palpitations, or LE swelling.   BP Readings from Last 3 Encounters:  11/03/11 151/82  09/26/11 130/84  09/22/11 147/88   There was also some confusion as patient told front office staff that he needed a cardiology referral.  It was thought he needed surgical clearance for his back.  However, he is not a candidate for back surgery and no cardiology referral was indicated.    PMH reviewed.  ROS as above otherwise neg.  No chest pain, palpitations, SOB, Fever, Chills, Abd pain, N/V/D.  Medications reviewed. Current Outpatient Prescriptions  Medication Sig Dispense Refill  . acetaminophen (TYLENOL) 325 MG tablet Take 325 mg by mouth 2 (two) times daily as needed. For pain      . atorvastatin (LIPITOR) 20 MG tablet Take 1 tablet (20 mg total) by mouth every evening.  30 tablet  2  . Camphor-Menthol (TIGER BALM EXTRA STRENGTH) 11-10 % OINT Apply 1 application topically daily as needed. For pain. Apply to left leg.      . Cholecalciferol (VITAMIN D) 2000 UNITS tablet Take 1 tablet (2,000 Units total) by mouth daily.  30 tablet  2  . ergocalciferol (VITAMIN D2) 50000 UNITS capsule Take 1 capsule (50,000 Units total) by mouth 2 (two) times a week.  4 capsule  2  . Liniments (SALONPAS PAIN RELIEF PATCH EX) Apply 1 patch topically daily as needed. For pain. Applies to left leg.      . polycarbophil (FIBERCON) 625 MG tablet Take 625 mg by mouth at bedtime.      . cyclobenzaprine (FLEXERIL) 5 MG tablet 1 tablet in am and afternoon and 2 qhs - to start these on Wednesday  30 tablet  1  . predniSONE (DELTASONE)  10 MG tablet 6-1 taper - take the days meds early in day all at once with food  21 tablet  0    Exam:  BP 151/82  Pulse 101  Temp 98.1 F (36.7 C) (Oral)  Resp 18  Ht 5' 9.5" (1.765 m)  Wt 227 lb (102.967 kg)  BMI 33.04 kg/m2  SpO2 99% Gen: Well NAD HEENT: EOMI,  MMM Pulm:  Clear to auscultation bilaterally with good air movement.  No wheezes or rales noted.   Cardiac:  Regular rate and rhythm without murmur auscultated.  Good S1/S2. Abd: NABS, NT, ND Back: TTP BL lumbar region Exts: Non edematous BL  LE, warm and well perfused.   Assessment and Plan:  1.  HTN:  Multiple times that he has had elevated blood pressures.  Starting Norvasc today.  Do not want to risk anything that might affect his solitary kidney.  FU in 1 month for blood pressure check with PCP.   Alveda Reasons, MD

## 2011-11-03 NOTE — Telephone Encounter (Signed)
Spoke w/pt who was very hard to understand over the phone due to a heavy accent, but I believe we eventually understood each other. Pt reported that he saw his doctor for his neck pain yesterday (at [place]) who told him that he needs to have his PCP send him to a cardiologist. I explained to pt that he will need to RTC to see one of our providers to eval him for this issue in order for [place] to refer him. Pt seemed to understand and agreed to come in.  Called [facility] to check to see if pt was there yesterday and request fax of OV notes w/info about why Dr Hal Neer advised cardiology consult. Dr Sande Rives secretary did not remember him saying anything about a cardiologist, only that pt is being referred to a pain specialist, and OV notes are not done yet. They stated that they will put in a note for Dr Hal Neer and/or fax OV notes when available to see whether a heart specialist was d/w pt.

## 2011-11-05 NOTE — Telephone Encounter (Signed)
In chart review pt came in to be seen later the night on 9/19 and Dr Mingo Amber saw pt pertaining to this.

## 2011-11-25 ENCOUNTER — Encounter (INDEPENDENT_AMBULATORY_CARE_PROVIDER_SITE_OTHER): Payer: BC Managed Care – PPO

## 2011-11-25 ENCOUNTER — Other Ambulatory Visit: Payer: Self-pay | Admitting: Cardiology

## 2011-11-25 ENCOUNTER — Encounter: Payer: Self-pay | Admitting: Cardiovascular Disease

## 2011-11-25 ENCOUNTER — Ambulatory Visit (INDEPENDENT_AMBULATORY_CARE_PROVIDER_SITE_OTHER): Payer: BC Managed Care – PPO | Admitting: Cardiovascular Disease

## 2011-11-25 VITALS — BP 104/70 | HR 78 | Ht 69.0 in | Wt 230.8 lb

## 2011-11-25 DIAGNOSIS — M79606 Pain in leg, unspecified: Secondary | ICD-10-CM

## 2011-11-25 DIAGNOSIS — R5381 Other malaise: Secondary | ICD-10-CM

## 2011-11-25 DIAGNOSIS — R0609 Other forms of dyspnea: Secondary | ICD-10-CM

## 2011-11-25 DIAGNOSIS — R5383 Other fatigue: Secondary | ICD-10-CM

## 2011-11-25 DIAGNOSIS — M79605 Pain in left leg: Secondary | ICD-10-CM | POA: Insufficient documentation

## 2011-11-25 DIAGNOSIS — M79609 Pain in unspecified limb: Secondary | ICD-10-CM

## 2011-11-25 DIAGNOSIS — R0989 Other specified symptoms and signs involving the circulatory and respiratory systems: Secondary | ICD-10-CM

## 2011-11-25 DIAGNOSIS — R0600 Dyspnea, unspecified: Secondary | ICD-10-CM | POA: Insufficient documentation

## 2011-11-25 DIAGNOSIS — E785 Hyperlipidemia, unspecified: Secondary | ICD-10-CM

## 2011-11-25 DIAGNOSIS — I10 Essential (primary) hypertension: Secondary | ICD-10-CM

## 2011-11-25 DIAGNOSIS — I739 Peripheral vascular disease, unspecified: Secondary | ICD-10-CM

## 2011-11-25 NOTE — Assessment & Plan Note (Signed)
Cholesterol is at goal.  Continue current dose of statin and diet Rx.  No myalgias or side effects.  F/U  LFT's in 6 months. Lab Results  Component Value Date   LDLCALC 94 09/26/2011

## 2011-11-25 NOTE — Assessment & Plan Note (Signed)
Doubt cardiac etiology with normal exam and ECG  F/U echo

## 2011-11-25 NOTE — Assessment & Plan Note (Signed)
Well controlled.  Continue current medications and low sodium Dash type diet.    

## 2011-11-25 NOTE — Assessment & Plan Note (Signed)
Seems more related to his back.  F/U ABI"s and LE arterial duplex

## 2011-11-25 NOTE — Progress Notes (Signed)
Patient ID: [name], male   DOB: 05-28-1965, 46 y.o.   MRN: [id number] 46 yo from [place].  Referred for fatigue dyspnea and leg pain.  He has chronic fatigue Thought is was from bad kidney but had right nephrectomy and still feels poorly.  Overweight with exertional dyspnea.  No chf or PND/orhtopnea. Has chronic back pain with sciatica but is worried about PAD.  Pain in left leg more neuropathic and not like claudication.  No history of CAD or MI.  No history of rheumatic fever or murmur.  Baseline Cr post nephrectomy 1.2 with normal TSH in 2/13.    ON good Rx for HTN and lipids  ROS: Denies fever, malais, weight loss, blurry vision, decreased visual acuity, cough, sputum, SOB, hemoptysis, pleuritic pain, palpitaitons, heartburn, abdominal pain, melena, lower extremity edema, claudication, or rash.  All other systems reviewed and negative   General: Affect appropriate Healthy:  appears stated age 36: normal Neck supple with no adenopathy JVP normal no bruits no thyromegaly Lungs clear with no wheezing and good diaphragmatic motion Heart:  S1/S2 no murmur,rub, gallop or click PMI normal Abdomen: benighn, BS positve, no tenderness, no AAA no bruit.  No HSM or HJR Distal pulses intact with no bruits No edema Neuro non-focal Skin warm and dry No muscular weakness  Medications Current Outpatient Prescriptions  Medication Sig Dispense Refill  . acetaminophen (TYLENOL) 325 MG tablet Take 325 mg by mouth 2 (two) times daily as needed. For pain      . amLODipine (NORVASC) 5 MG tablet Take 1 tablet (5 mg total) by mouth daily.  30 tablet  2  . atorvastatin (LIPITOR) 20 MG tablet Take 1 tablet (20 mg total) by mouth every evening.  30 tablet  2  . Camphor-Menthol (TIGER BALM EXTRA STRENGTH) 11-10 % OINT Apply 1 application topically daily as needed. For pain. Apply to left leg.      . Cholecalciferol (VITAMIN D) 2000 UNITS CAPS Take 1 capsule by mouth daily.      . Cholecalciferol (VITAMIN D)  2000 UNITS tablet Take 1 tablet (2,000 Units total) by mouth daily.  30 tablet  2  . HYDROcodone-acetaminophen (VICODIN ES) 7.5-750 MG per tablet       . polycarbophil (FIBERCON) 625 MG tablet Take 625 mg by mouth daily.      . cyclobenzaprine (FLEXERIL) 5 MG tablet 1 tablet in am and afternoon and 2 qhs - to start these on Wednesday  30 tablet  1  . Liniments (SALONPAS PAIN RELIEF PATCH EX) Apply 1 patch topically daily as needed. For pain. Applies to left leg.      . polycarbophil (FIBERCON) 625 MG tablet Take 625 mg by mouth at bedtime.      . predniSONE (DELTASONE) 10 MG tablet 6-1 taper - take the days meds early in day all at once with food  21 tablet  0    Allergies Beef-derived products and Pork-derived products  Family History: Family History  Problem Relation Age of Onset  . Hyperlipidemia Father   . Heart disease Mother   . Hypertension Mother   . Hypertension Sister   . Heart disease Brother 38    CABG  . Hyperlipidemia Sister   . Hyperlipidemia Sister     Social History: History   Social History  . Marital Status: Married    Spouse Name: N/A    Number of Children: N/A  . Years of Education: N/A   Occupational History  . Not on  file.   Social History Main Topics  . Smoking status: Never Smoker   . Smokeless tobacco: Never Used  . Alcohol Use: No  . Drug Use: No  . Sexually Active: Yes   Other Topics Concern  . Not on file   Social History Narrative   Lives at home with wife and family.    Electrocardiogram:  NSR rate 73 normal with no LVH  Assessment and Plan

## 2011-11-25 NOTE — Patient Instructions (Addendum)
Your physician recommends that you schedule a follow-up appointment in:   AS NEEDED  Your physician recommends that you continue on your current medications as directed. Please refer to the Current Medication list given to you today. Your physician has requested that you have a lower or upper extremity arterial duplex. This test is an ultrasound of the arteries in the legs or arms. It looks at arterial blood flow in the legs and arms. Allow one hour for Lower and Upper Arterial scans. There are no restrictions or special instructions Dx leg pain Your physician has requested that you have an echocardiogram. Echocardiography is a painless test that uses sound waves to create images of your heart. It provides your doctor with information about the size and shape of your heart and how well your heart's chambers and valves are working. This procedure takes approximately one hour. There are no restrictions for this procedure.  DX  DYSPNEA

## 2011-11-29 ENCOUNTER — Ambulatory Visit: Payer: BC Managed Care – PPO | Admitting: Family Medicine

## 2011-12-01 ENCOUNTER — Ambulatory Visit (HOSPITAL_COMMUNITY): Payer: BC Managed Care – PPO | Attending: Cardiovascular Disease | Admitting: Radiology

## 2011-12-01 DIAGNOSIS — R5383 Other fatigue: Secondary | ICD-10-CM

## 2011-12-01 DIAGNOSIS — I10 Essential (primary) hypertension: Secondary | ICD-10-CM | POA: Insufficient documentation

## 2011-12-01 DIAGNOSIS — R0609 Other forms of dyspnea: Secondary | ICD-10-CM | POA: Insufficient documentation

## 2011-12-01 DIAGNOSIS — R0989 Other specified symptoms and signs involving the circulatory and respiratory systems: Secondary | ICD-10-CM | POA: Insufficient documentation

## 2011-12-01 DIAGNOSIS — R0600 Dyspnea, unspecified: Secondary | ICD-10-CM

## 2011-12-01 NOTE — Progress Notes (Signed)
Echocardiogram performed.  

## 2011-12-07 ENCOUNTER — Telehealth: Payer: Self-pay | Admitting: Cardiovascular Disease

## 2011-12-07 NOTE — Telephone Encounter (Signed)
PT AWARE OF ECHO RESULTS./CY 

## 2011-12-07 NOTE — Telephone Encounter (Signed)
New Problem:    Patient called in returning a call about her ECHO results.  Please call back.

## 2012-01-02 ENCOUNTER — Ambulatory Visit (INDEPENDENT_AMBULATORY_CARE_PROVIDER_SITE_OTHER): Payer: BC Managed Care – PPO | Admitting: Family Medicine

## 2012-01-02 VITALS — BP 144/90 | HR 75 | Temp 98.1°F | Resp 18 | Ht 69.0 in | Wt 235.0 lb

## 2012-01-02 DIAGNOSIS — E7800 Pure hypercholesterolemia, unspecified: Secondary | ICD-10-CM

## 2012-01-02 DIAGNOSIS — R5383 Other fatigue: Secondary | ICD-10-CM

## 2012-01-02 DIAGNOSIS — M519 Unspecified thoracic, thoracolumbar and lumbosacral intervertebral disc disorder: Secondary | ICD-10-CM

## 2012-01-02 DIAGNOSIS — Z905 Acquired absence of kidney: Secondary | ICD-10-CM

## 2012-01-02 DIAGNOSIS — Z8739 Personal history of other diseases of the musculoskeletal system and connective tissue: Secondary | ICD-10-CM

## 2012-01-02 DIAGNOSIS — Z8639 Personal history of other endocrine, nutritional and metabolic disease: Secondary | ICD-10-CM

## 2012-01-02 DIAGNOSIS — I10 Essential (primary) hypertension: Secondary | ICD-10-CM

## 2012-01-02 DIAGNOSIS — R5381 Other malaise: Secondary | ICD-10-CM

## 2012-01-02 DIAGNOSIS — E785 Hyperlipidemia, unspecified: Secondary | ICD-10-CM

## 2012-01-02 DIAGNOSIS — Z862 Personal history of diseases of the blood and blood-forming organs and certain disorders involving the immune mechanism: Secondary | ICD-10-CM

## 2012-01-02 LAB — POCT URINALYSIS DIPSTICK
Protein, UA: 100
Spec Grav, UA: 1.025
Urobilinogen, UA: 0.2

## 2012-01-02 LAB — POCT CBC
Lymph, poc: 2.5 (ref 0.6–3.4)
MCH, POC: 28.2 pg (ref 27–31.2)
MCHC: 31.4 g/dL — AB (ref 31.8–35.4)
MCV: 89.8 fL (ref 80–97)
MID (cbc): 0.4 (ref 0–0.9)
POC LYMPH PERCENT: 35.6 %L (ref 10–50)
Platelet Count, POC: 235 10*3/uL (ref 142–424)
RBC: 5.07 M/uL (ref 4.69–6.13)
RDW, POC: 13.5 %
WBC: 7.1 10*3/uL (ref 4.6–10.2)

## 2012-01-02 LAB — POCT UA - MICROSCOPIC ONLY
Casts, Ur, LPF, POC: NEGATIVE
Crystals, Ur, HPF, POC: NEGATIVE

## 2012-01-02 MED ORDER — ATORVASTATIN CALCIUM 20 MG PO TABS: 20.0000 mg | ORAL_TABLET | Freq: Every evening | ORAL | Status: DC

## 2012-01-02 MED ORDER — AMLODIPINE BESYLATE 5 MG PO TABS: 5.0000 mg | ORAL_TABLET | Freq: Every day | ORAL | Status: DC

## 2012-01-02 NOTE — Progress Notes (Signed)
Subjective: 46 year old [place] American man who has been having persisting fatigue. He has had a number of things done. He had to have a right nephrectomy for enlargement of the kidney and for kidney stones. The function of the kidney was gone. He has done okay since that surgery but is continued to be very fatigued. He also saw a cardiologist for his fatigue and chest discomfort, but was not found to have any pathology. He was here with back problems and was referred to a neurosurgeon. He was found to have lumbar disc disease and has had 3 injections. It is still bothering him. He is quite concerned about his ongoing fatigue.  Brief review of systems revealed nonspecific chest discomfort and shortness of breath. Eats well but he is tired after eating. He is time for working. GU he is concerned about but no specific complaints.  Objective: Modestly overweight. TMs normal. Apparently has had bilateral ear surgery which restored his hearing. His throat is clear. Neck supple without significant nodes. Chest clear to auscultation. Heart regular without any murmurs. Abdomen has 2 large scars, in the right upper quadrant and low right low midline. The upper one was from the nephrectomy, the lower one from some kind of ulcer he had when he was in [place]. Soft without masses or tenderness. Extremities without edema.  Assessment: Fatigue Status post right nephrectomy Lumbar disease  Plan: Recheck blood surgeries including a complete metabolic panel and a CBC. Also will get a urinalysis.  Results for orders placed in visit on 01/02/12  POCT CBC      Component Value Range   WBC 7.1  4.6 - 10.2 K/uL   Lymph, poc 2.5  0.6 - 3.4   POC LYMPH PERCENT 35.6  10 - 50 %L   MID (cbc) 0.4  0 - 0.9   POC MID % 6.1  0 - 12 %M   POC Granulocyte 4.1  2 - 6.9   Granulocyte percent 58.3  37 - 80 %G   RBC 5.07  4.69 - 6.13 M/uL   Hemoglobin 14.3  14.1 - 18.1 g/dL   HCT, POC 45.5  43.5 - 53.7 %   MCV 89.8  80 - 97  fL   MCH, POC 28.2  27 - 31.2 pg   MCHC 31.4 (*) 31.8 - 35.4 g/dL   RDW, POC 13.5     Platelet Count, POC 235  142 - 424 K/uL   MPV 10.4  0 - 99.8 fL  POCT UA - MICROSCOPIC ONLY      Component Value Range   WBC, Ur, HPF, POC 0-2     RBC, urine, microscopic 0-2     Bacteria, U Microscopic trace     Mucus, UA trace     Epithelial cells, urine per micros 1-2     Crystals, Ur, HPF, POC neg     Casts, Ur, LPF, POC neg     Yeast, UA neg    POCT URINALYSIS DIPSTICK      Component Value Range   Color, UA yellow     Clarity, UA clear     Glucose, UA neg     Bilirubin, UA neg     Ketones, UA neg     Spec Grav, UA 1.025     Blood, UA neg     pH, UA 5.5     Protein, UA 100     Urobilinogen, UA 0.2     Nitrite, UA neg     Leukocytes,  UA Negative     Reviewed labs with him. Told her that other labs were pending and I would get back to him. Refilled his blood pressure and cholesterol medicine. Explained to him that everything looks good. I do not have an easy answer for his fatigue. Urged him to get more regular exercise. Thank you

## 2012-01-03 LAB — COMPREHENSIVE METABOLIC PANEL
Alkaline Phosphatase: 55 U/L (ref 39–117)
BUN: 17 mg/dL (ref 6–23)
Glucose, Bld: 89 mg/dL (ref 70–99)
Sodium: 145 mEq/L (ref 135–145)
Total Bilirubin: 0.7 mg/dL (ref 0.3–1.2)
Total Protein: 6.9 g/dL (ref 6.0–8.3)

## 2012-01-03 LAB — LIPID PANEL
HDL: 34 mg/dL — ABNORMAL LOW (ref >39)
LDL Cholesterol: 62 mg/dL (ref 0–99)
Total CHOL/HDL Ratio: 4.9 Ratio
Triglycerides: 359 mg/dL — ABNORMAL HIGH (ref <150)
VLDL: 72 mg/dL — ABNORMAL HIGH (ref 0–40)

## 2012-01-06 ENCOUNTER — Encounter: Payer: Self-pay | Admitting: Family Medicine

## 2012-02-16 ENCOUNTER — Other Ambulatory Visit: Payer: Self-pay | Admitting: Neurosurgery

## 2012-02-16 DIAGNOSIS — M5126 Other intervertebral disc displacement, lumbar region: Secondary | ICD-10-CM

## 2012-02-18 ENCOUNTER — Ambulatory Visit
Admission: RE | Admit: 2012-02-18 | Discharge: 2012-02-18 | Disposition: A | Discharge status: Home / Self Care | Payer: BC Managed Care – PPO | Source: Ambulatory Visit | Attending: Neurosurgery | Admitting: Neurosurgery

## 2012-02-18 DIAGNOSIS — M5126 Other intervertebral disc displacement, lumbar region: Secondary | ICD-10-CM

## 2012-04-05 ENCOUNTER — Other Ambulatory Visit: Payer: Self-pay | Admitting: Family Medicine

## 2012-05-16 ENCOUNTER — Encounter (HOSPITAL_COMMUNITY): Payer: Self-pay | Admitting: Emergency Medicine

## 2012-05-16 ENCOUNTER — Emergency Department (HOSPITAL_COMMUNITY)
Admission: EM | Admit: 2012-05-16 | Discharge: 2012-05-16 | Disposition: A | Discharge status: Home / Self Care | Payer: BC Managed Care – PPO | Source: Home / Self Care | Attending: Family Medicine | Admitting: Family Medicine

## 2012-05-16 DIAGNOSIS — Z760 Encounter for issue of repeat prescription: Secondary | ICD-10-CM

## 2012-05-16 DIAGNOSIS — R52 Pain, unspecified: Secondary | ICD-10-CM

## 2012-05-16 DIAGNOSIS — M545 Low back pain, unspecified: Secondary | ICD-10-CM

## 2012-05-16 DIAGNOSIS — G8929 Other chronic pain: Secondary | ICD-10-CM

## 2012-05-16 DIAGNOSIS — I10 Essential (primary) hypertension: Secondary | ICD-10-CM

## 2012-05-16 HISTORY — DX: Dorsalgia, unspecified: M54.9

## 2012-05-16 MED ORDER — ATORVASTATIN CALCIUM 10 MG PO TABS: 20.0000 mg | ORAL_TABLET | Freq: Every day | ORAL | Status: DC

## 2012-05-16 MED ORDER — AMLODIPINE BESYLATE 5 MG PO TABS: 5.0000 mg | ORAL_TABLET | Freq: Every day | ORAL | Status: DC

## 2012-05-16 MED ORDER — CYCLOBENZAPRINE HCL 10 MG PO TABS: 10.0000 mg | ORAL_TABLET | Freq: Every evening | ORAL | Status: DC | PRN

## 2012-05-16 MED ORDER — TRAMADOL-ACETAMINOPHEN 37.5-325 MG PO TABS: 1.0000 | ORAL_TABLET | Freq: Three times a day (TID) | ORAL | Status: DC | PRN

## 2012-05-16 NOTE — ED Provider Notes (Signed)
History     CSN: [id number]  Arrival date & time 05/16/12  66   First MD Initiated Contact with Patient 05/16/12 1137      Chief Complaint  Patient presents with  . Medication Refill    (Consider location/radiation/quality/duration/timing/severity/associated sxs/prior treatment) HPI Comments: 47 year old male with history of hypertension, high cholesterol, chronic back pain due to to disc disease and right nephrectomy among other comorbidities. He is here requesting refills on his blood pressure and cholesterol medications and would like to have his blood cholesterol checked. He also complaining of exacerbation of his chronic low back pain. Denies radiation of his pain to his legs. Also denies urinary or stool incontinence. Denies hematuria. Denies low extremity weakness numbness or paresthesias. No fever or chills. Has not taken his blood pressure medication today as he ran out. Hes primary doctor is Dr. Linna Darner at [facility] urgent care. Patient was under the impression that he could also have primary care followup with his chronic conditions and this urgent care. He is due for a followup appointment with his PCP.   Past Medical History  Diagnosis Date  . Kidney stones 2007  . PUD (peptic ulcer disease)     Has had unspecified surgery for this  . Hyperlipidemia   . Elevated cholesterol   . Hypertension     no current bp meds for last 3 months  . Anemia   . Back pain     Past Surgical History  Procedure Laterality Date  . Surgery for ulcers  1990  . Middle ear surgery  2005    both ears  . Nephrectomy  02/18/2011    Procedure: NEPHRECTOMY;  Surgeon: Hanley Ben, MD;  Location: [facility];  Service: Urology;  Laterality: Right;    Family History  Problem Relation Age of Onset  . Hyperlipidemia Father   . Heart disease Mother   . Hypertension Mother   . Hypertension Sister   . Heart disease Brother 43    CABG  . Hyperlipidemia Sister   . Hyperlipidemia Sister      History  Substance Use Topics  . Smoking status: Never Smoker   . Smokeless tobacco: Never Used  . Alcohol Use: No      Review of Systems  Constitutional: Negative for fever, chills, diaphoresis, appetite change and fatigue.  Respiratory: Negative for shortness of breath.   Cardiovascular: Negative for chest pain.  Gastrointestinal: Negative for nausea, vomiting and abdominal pain.  Genitourinary: Negative for dysuria, frequency, hematuria and flank pain.  Musculoskeletal: Positive for back pain.  Neurological: Negative for dizziness and light-headedness.    Allergies  Beef-derived products and Pork-derived products  Home Medications   Current Outpatient Rx  Name  Route  Sig  Dispense  Refill  . acetaminophen (TYLENOL) 325 MG tablet   Oral   Take 325 mg by mouth 2 (two) times daily as needed. For pain         . amLODipine (NORVASC) 5 MG tablet   Oral   Take 1 tablet (5 mg total) by mouth daily.   30 tablet   0   . atorvastatin (LIPITOR) 10 MG tablet   Oral   Take 2 tablets (20 mg total) by mouth at bedtime.   30 tablet   0   . Camphor-Menthol (TIGER BALM EXTRA STRENGTH) 11-10 % OINT   Apply externally   Apply 1 application topically daily as needed. For pain. Apply to left leg.         Marland Kitchen  Cholecalciferol (VITAMIN D) 2000 UNITS CAPS   Oral   Take 1 capsule by mouth daily.         . Cholecalciferol (VITAMIN D) 2000 UNITS tablet   Oral   Take 1 tablet (2,000 Units total) by mouth daily.   30 tablet   2   . cyclobenzaprine (FLEXERIL) 10 MG tablet   Oral   Take 1 tablet (10 mg total) by mouth at bedtime as needed for muscle spasms.   15 tablet   0   . Liniments (SALONPAS PAIN RELIEF PATCH EX)   Apply externally   Apply 1 patch topically daily as needed. For pain. Applies to left leg.         . polycarbophil (FIBERCON) 625 MG tablet   Oral   Take 625 mg by mouth daily.         . traMADol-acetaminophen (ULTRACET) 37.5-325 MG per tablet    Oral   Take 1 tablet by mouth every 8 (eight) hours as needed for pain (can take 1-2 tablets every 8 hours as needed for pain).   30 tablet   0     BP 134/84  Pulse 87  Temp(Src) 98.4 F (36.9 C) (Oral)  Resp 16  SpO2 99%  Physical Exam  Nursing note and vitals reviewed. Constitutional: He is oriented to person, place, and time. He appears well-developed and well-nourished. No distress.  obese  HENT:  Head: Normocephalic and atraumatic.  Mouth/Throat: Oropharynx is clear and moist.  Eyes: Conjunctivae are normal. No scleral icterus.  Neck: No JVD present. No thyromegaly present.  Cardiovascular: Normal rate, regular rhythm and normal heart sounds.  Exam reveals no gallop and no friction rub.   No murmur heard. Pulmonary/Chest: Effort normal and breath sounds normal. No respiratory distress. He has no wheezes. He has no rales. He exhibits no tenderness.  Abdominal: Soft. There is no tenderness.  Musculoskeletal:  Central spine with no scoliosis or kyphosis. limited anterior flexion and posterior extension due to pain and mid body obesity. Patient able to walk on tip toes and heels with no difficulty foot drop or pain exacerbation. No bone prominence tenderness. Tenderness to palpation in bilateral lumbar paravertebral muscles.  Negative straight leg test bilateral. Intact sensation and symmetric + DTRs (rotullian and achillean) in low extremities.   Neurological: He is alert and oriented to person, place, and time.  Skin: No rash noted. He is not diaphoretic.    ED Course  Procedures (including critical care time)  Labs Reviewed - No data to display No results found.   1. Acute exacerbation of chronic low back pain   2. Medication refill   3. HTN (hypertension)       MDM  Refill atorvastatin 20 mg daily and Norvasc 5 mg daily for one month. Prescribed Flexeril 5 mg each bedtime and Ultracet every 8 hours when necessary for chronic back pain. Patient states that he  will call to make a followup appointment with his primary care provider to monitor his chronic conditions and for further refills.        Randa Spike, MD 05/18/12 [phone]

## 2012-05-16 NOTE — ED Notes (Addendum)
Patient requesting to have a check up and medication refill.  Physician was on [employer]  Patient concerned for high cholesterol, vit d and blood pressure.  Out of medicines since last week.  Patient has chronic back pain, and completes of being tired all the time

## 2012-06-18 ENCOUNTER — Ambulatory Visit: Payer: BC Managed Care – PPO

## 2012-06-18 ENCOUNTER — Ambulatory Visit (INDEPENDENT_AMBULATORY_CARE_PROVIDER_SITE_OTHER): Payer: BC Managed Care – PPO | Admitting: Internal Medicine

## 2012-06-18 VITALS — BP 122/100 | HR 77 | Temp 98.0°F | Resp 16 | Ht 69.0 in | Wt 237.0 lb

## 2012-06-18 DIAGNOSIS — I10 Essential (primary) hypertension: Secondary | ICD-10-CM

## 2012-06-18 DIAGNOSIS — M549 Dorsalgia, unspecified: Secondary | ICD-10-CM

## 2012-06-18 DIAGNOSIS — E559 Vitamin D deficiency, unspecified: Secondary | ICD-10-CM

## 2012-06-18 DIAGNOSIS — Z760 Encounter for issue of repeat prescription: Secondary | ICD-10-CM

## 2012-06-18 DIAGNOSIS — E7800 Pure hypercholesterolemia, unspecified: Secondary | ICD-10-CM

## 2012-06-18 DIAGNOSIS — L600 Ingrowing nail: Secondary | ICD-10-CM

## 2012-06-18 LAB — POCT URINALYSIS DIPSTICK
Blood, UA: NEGATIVE
Nitrite, UA: NEGATIVE
Urobilinogen, UA: 0.2
pH, UA: 5.5

## 2012-06-18 LAB — POCT CBC
Granulocyte percent: 54.7 %G (ref 37–80)
HCT, POC: 44 % (ref 43.5–53.7)
Hemoglobin: 13.7 g/dL — AB (ref 14.1–18.1)
Lymph, poc: 2.3 (ref 0.6–3.4)
MCHC: 31.1 g/dL — AB (ref 31.8–35.4)
POC Granulocyte: 3.3 (ref 2–6.9)

## 2012-06-18 LAB — POCT UA - MICROSCOPIC ONLY: Bacteria, U Microscopic: NEGATIVE

## 2012-06-18 MED ORDER — CYCLOBENZAPRINE HCL 10 MG PO TABS: 10.0000 mg | ORAL_TABLET | Freq: Every evening | ORAL | Status: DC | PRN

## 2012-06-18 MED ORDER — ATORVASTATIN CALCIUM 10 MG PO TABS: 20.0000 mg | ORAL_TABLET | Freq: Every day | ORAL | Status: DC

## 2012-06-18 MED ORDER — AMLODIPINE BESYLATE 5 MG PO TABS: 5.0000 mg | ORAL_TABLET | Freq: Every day | ORAL | Status: DC

## 2012-06-18 NOTE — Progress Notes (Signed)
Subjective:    Patient ID: [name], male    DOB: Jul 28, 1965, 47 y.o.   MRN: [id number]  HPI  Multiple issue. 1. Needs medication refill for htn and hyperliidmia meds.  2. Has an ingrown toenail on the right great toe. 3. Wants eval fo rhis vit d to see if he still needs to take it. 4. Has back pain in the upper scapular thoracici area dn also has pain bilateral legs andterior tibial surfaces. No trauma Has had back pain for a while.  Toe has drainage and pain when he wears shoes.   Review of Systems  Unable to perform ROS Musculoskeletal: Positive for back pain.  Skin: Positive for wound.       Has ingrown toenail on the right great toe  All other systems reviewed and are negative.       Objective:   Physical Exam  Nursing note and vitals reviewed. Constitutional: He is oriented to person, place, and time. He appears well-developed and well-nourished.  HENT:  Head: Normocephalic and atraumatic.  Right Ear: External ear normal.  Left Ear: External ear normal.  Nose: Nose normal.  Mouth/Throat: Oropharynx is clear and moist.  Eyes: Conjunctivae and EOM are normal. Pupils are equal, round, and reactive to light.  Neck: Normal range of motion. Neck supple.  Cardiovascular: Normal rate, regular rhythm and normal heart sounds.   Pulmonary/Chest: Effort normal and breath sounds normal.  Abdominal: Soft. Bowel sounds are normal.  Musculoskeletal: Normal range of motion.  Tender spasm of the mid scapular area pain is increased on flexion  Neurological: He is alert and oriented to person, place, and time.  Skin:  Ingrown toenail to the right great toe  Psychiatric: He has a normal mood and affect. His behavior is normal. Judgment and thought content normal.     Results for orders placed in visit on 06/18/12  POCT CBC      Result Value Range   WBC 6.0  4.6 - 10.2 K/uL   Lymph, poc 2.3  0.6 - 3.4   POC LYMPH PERCENT 37.6  10 - 50 %L   MID (cbc) 0.5  0 - 0.9   POC MID %  7.7  0 - 12 %M   POC Granulocyte 3.3  2 - 6.9   Granulocyte percent 54.7  37 - 80 %G   RBC 5.05  4.69 - 6.13 M/uL   Hemoglobin 13.7 (*) 14.1 - 18.1 g/dL   HCT, POC 44.0  43.5 - 53.7 %   MCV 87.2  80 - 97 fL   MCH, POC 27.1  27 - 31.2 pg   MCHC 631.1 (*) 31.8 - 35.4 g/dL   RDW, POC 12.6     Platelet Count, POC 224  142 - 424 K/uL   MPV 11.7  0 - 99.8 fL  POCT UA - MICROSCOPIC ONLY      Result Value Range   WBC, Ur, HPF, POC 0-2     RBC, urine, microscopic 0-1     Bacteria, U Microscopic neg     Mucus, UA small     Epithelial cells, urine per micros 0-3     Crystals, Ur, HPF, POC neg     Casts, Ur, LPF, POC neg     Yeast, UA neg    POCT URINALYSIS DIPSTICK      Result Value Range   Color, UA yellow     Clarity, UA clear     Glucose, UA neg  Bilirubin, UA neg     Ketones, UA trace     Spec Grav, UA >=1.030     Blood, UA neg     pH, UA 5.5     Protein, UA >=300     Urobilinogen, UA 0.2     Nitrite, UA neg     Leukocytes, UA Negative     ua with proteinura will eval bun and cr Cbc normal    Assessment & Plan:  Will have chelle eval and treat the ingrown toenail. Thoracic spine films to evaluate for the pain in his back. Will access the labs since pt only has one kidney and had not had his labs evaluated since November. Send out labs are the vit d and cmp. And lipids. Ingrow toenail surgically treated Chelle J.PA. Continue present medications meds refilled for htn and high cholesterol.

## 2012-06-18 NOTE — Addendum Note (Signed)
Addended by: Fara Chute on: 06/18/2012 07:36 PM   Modules accepted: Level of Service

## 2012-06-18 NOTE — Progress Notes (Signed)
  Subjective:    Patient ID: [name], male    DOB: Jun 23, 1965, 47 y.o.   MRN: [id number]  HPI    Review of Systems     Objective:   Physical Exam    [facility] reading (PRIMARY) by  Dr. Benjaman Lobe negitive thoracic spine.     Assessment & Plan:

## 2012-06-18 NOTE — Patient Instructions (Addendum)
Take meds as directed flexeril 1 tab every 8 hours as needed for back spasm.,the rest of your lab tests will be back next week. Any problems return to the office.  INGROWN TOENAIL . Keep area clean, dry and bandaged for 24 hours. . After 24 hours, remove outer bandage and leave yellow gauze in place. [name] toe/foot in warm soapy water for 5-10 minutes, once daily for 5 days. Rebandage toe after each cleaning. . Continue soaks until yellow gauze falls off. . Notify the office if you experience any of the following signs of infection: Swelling, redness, pus drainage, streaking, fever > 101.0 F

## 2012-06-18 NOTE — Progress Notes (Signed)
Verbal Consent Obtained. Digital Block with 7 cc 2% Lidocaine plain. Sterile Prep and Drape. RIGHT medial nail lifted and excised. Xeroform placed. Cleansed and dressed.

## 2012-06-19 LAB — COMPREHENSIVE METABOLIC PANEL
Albumin: 4.5 g/dL (ref 3.5–5.2)
Alkaline Phosphatase: 64 U/L (ref 39–117)
CO2: 27 mEq/L (ref 19–32)
Glucose, Bld: 87 mg/dL (ref 70–99)
Potassium: 4.3 mEq/L (ref 3.5–5.3)
Sodium: 140 mEq/L (ref 135–145)
Total Protein: 6.7 g/dL (ref 6.0–8.3)

## 2012-06-19 LAB — LIPID PANEL
HDL: 29 mg/dL — ABNORMAL LOW (ref >39)
Triglycerides: 617 mg/dL — ABNORMAL HIGH (ref <150)

## 2012-06-28 MED ORDER — GEMFIBROZIL 600 MG PO TABS: 600.0000 mg | ORAL_TABLET | Freq: Two times a day (BID) | ORAL | Status: DC

## 2012-06-28 NOTE — Addendum Note (Signed)
Addended by: Constance Goltz on: 06/28/2012 08:16 PM   Modules accepted: Orders

## 2012-07-02 ENCOUNTER — Telehealth: Payer: Self-pay

## 2012-07-02 NOTE — Telephone Encounter (Signed)
Pt is calling about his prescriptions couldn't really understand what he was wanting. Call back number is [phone]

## 2012-07-03 ENCOUNTER — Other Ambulatory Visit: Payer: Self-pay | Admitting: *Deleted

## 2012-07-03 NOTE — Telephone Encounter (Signed)
Advised him to take the cholesterol medications both the Gemfibrozil and the Lipitor. I hope he understood me, I did not understand him.

## 2012-07-03 NOTE — Telephone Encounter (Signed)
Left message for him to call me back.  

## 2012-07-08 MED ORDER — AMLODIPINE BESYLATE 5 MG PO TABS
5.0000 mg | ORAL_TABLET | Freq: Every day | ORAL | Status: DC
Start: 2012-07-08 — End: 2012-07-21

## 2012-07-08 NOTE — Addendum Note (Signed)
Addended by: Constance Goltz on: 07/08/2012 09:11 AM   Modules accepted: Orders

## 2012-07-21 ENCOUNTER — Emergency Department (HOSPITAL_COMMUNITY)
Admission: EM | Admit: 2012-07-21 | Discharge: 2012-07-21 | Disposition: A | Discharge status: Home / Self Care | Payer: BC Managed Care – PPO | Attending: Emergency Medicine | Admitting: Emergency Medicine

## 2012-07-21 ENCOUNTER — Encounter (HOSPITAL_COMMUNITY): Payer: Self-pay | Admitting: Physical Medicine and Rehabilitation

## 2012-07-21 DIAGNOSIS — M5432 Sciatica, left side: Secondary | ICD-10-CM

## 2012-07-21 DIAGNOSIS — Z79899 Other long term (current) drug therapy: Secondary | ICD-10-CM | POA: Insufficient documentation

## 2012-07-21 DIAGNOSIS — R300 Dysuria: Secondary | ICD-10-CM | POA: Insufficient documentation

## 2012-07-21 DIAGNOSIS — D649 Anemia, unspecified: Secondary | ICD-10-CM | POA: Insufficient documentation

## 2012-07-21 DIAGNOSIS — Z87442 Personal history of urinary calculi: Secondary | ICD-10-CM | POA: Insufficient documentation

## 2012-07-21 DIAGNOSIS — E785 Hyperlipidemia, unspecified: Secondary | ICD-10-CM | POA: Insufficient documentation

## 2012-07-21 DIAGNOSIS — R269 Unspecified abnormalities of gait and mobility: Secondary | ICD-10-CM | POA: Insufficient documentation

## 2012-07-21 DIAGNOSIS — M5430 Sciatica, unspecified side: Secondary | ICD-10-CM | POA: Insufficient documentation

## 2012-07-21 DIAGNOSIS — M545 Low back pain, unspecified: Secondary | ICD-10-CM | POA: Insufficient documentation

## 2012-07-21 DIAGNOSIS — I10 Essential (primary) hypertension: Secondary | ICD-10-CM | POA: Insufficient documentation

## 2012-07-21 DIAGNOSIS — Z8711 Personal history of peptic ulcer disease: Secondary | ICD-10-CM | POA: Insufficient documentation

## 2012-07-21 DIAGNOSIS — E7800 Pure hypercholesterolemia, unspecified: Secondary | ICD-10-CM | POA: Insufficient documentation

## 2012-07-21 DIAGNOSIS — Z8739 Personal history of other diseases of the musculoskeletal system and connective tissue: Secondary | ICD-10-CM | POA: Insufficient documentation

## 2012-07-21 LAB — URINALYSIS, ROUTINE W REFLEX MICROSCOPIC
Bilirubin Urine: NEGATIVE
Leukocytes, UA: NEGATIVE
Nitrite: NEGATIVE
Specific Gravity, Urine: 1.025 (ref 1.005–1.030)
pH: 6 (ref 5.0–8.0)

## 2012-07-21 LAB — COMPREHENSIVE METABOLIC PANEL
ALT: 17 U/L (ref 0–53)
Calcium: 9.7 mg/dL (ref 8.4–10.5)
GFR calc Af Amer: 84 mL/min — ABNORMAL LOW (ref >90)
Glucose, Bld: 115 mg/dL — ABNORMAL HIGH (ref 70–99)
Sodium: 137 mEq/L (ref 135–145)
Total Protein: 7.2 g/dL (ref 6.0–8.3)

## 2012-07-21 LAB — CBC WITH DIFFERENTIAL/PLATELET
Eosinophils Absolute: 0.1 10*3/uL (ref 0.0–0.7)
Eosinophils Relative: 1 % (ref 0–5)
Lymphs Abs: 1.8 10*3/uL (ref 0.7–4.0)
MCH: 28.5 pg (ref 26.0–34.0)
MCHC: 33.8 g/dL (ref 30.0–36.0)
MCV: 84.2 fL (ref 78.0–100.0)
Platelets: 218 10*3/uL (ref 150–400)
RBC: 5.06 MIL/uL (ref 4.22–5.81)
RDW: 12.4 % (ref 11.5–15.5)

## 2012-07-21 LAB — URINE MICROSCOPIC-ADD ON

## 2012-07-21 MED ORDER — DIAZEPAM 5 MG PO TABS
10.0000 mg | ORAL_TABLET | Freq: Once | ORAL | Status: AC
  Administered 2012-07-21: 10 mg via ORAL
  Filled 2012-07-21: qty 2

## 2012-07-21 MED ORDER — PREDNISONE 20 MG PO TABS: 40.0000 mg | ORAL_TABLET | Freq: Every day | ORAL | Status: DC

## 2012-07-21 MED ORDER — DIAZEPAM 5 MG PO TABS: 5.0000 mg | ORAL_TABLET | Freq: Two times a day (BID) | ORAL | Status: DC

## 2012-07-21 MED ORDER — IBUPROFEN 800 MG PO TABS
800.0000 mg | ORAL_TABLET | Freq: Once | ORAL | Status: AC
  Administered 2012-07-21: 800 mg via ORAL
  Filled 2012-07-21: qty 1

## 2012-07-21 MED ORDER — IBUPROFEN 800 MG PO TABS: 800.0000 mg | ORAL_TABLET | Freq: Three times a day (TID) | ORAL | Status: DC

## 2012-07-21 NOTE — ED Notes (Signed)
Pt presents to department for evaluation of L sided flank pain and burning with urination. States pain also radiates down L leg. 10/10 at the time. States he only has (1) kidney. Pt is alert and oriented x4.

## 2012-07-21 NOTE — ED Notes (Signed)
C/O pain in his back that radiates down his left leg. States that he was seen for this by his PCP who referred him to Dr. Hal Neer.  Patient states that he was given a shot in his back which was of no help.  States that the pain is severe.  He is unable to stand from a sitting position without assistance. C/O difficulty ambulating.

## 2012-07-21 NOTE — ED Provider Notes (Signed)
History     CSN: [id number]  Arrival date & time 07/21/12  1224   First MD Initiated Contact with Patient 07/21/12 1253      Chief Complaint  Patient presents with  . Flank Pain  . Dysuria    (Consider location/radiation/quality/duration/timing/severity/associated sxs/prior treatment) HPI Pt is a 47yo male with chronic LBP c/o worsening left sided LBP that is severe, sharp in nature, worsened with movement, radiates down left leg w/o numbness or tingling. There is a language barrier but able to obtain history.  Pt provided me letter from [facility], Dr. Karie Chimera who ordered an MRI of pt's lumbar spine.  This study showed bulging disc around L5-S1 and impingement of surrounding nerves.  States pt should consider surgery if pain medications do not relieve pain, however Dr. Hal Neer states due to language barrier he does not believe pt fully understands his diagnosis and possible need for surgery.  Today pt states all he receives pain mediation that does not help with pain.  Does not recall discussing surgery with Dr. Hal Neer. Pt denies urinary symptoms at this time.  Denies fever, n/v/d.  Denies medication allergies.  Denies new trauma to lower back. Denies loss of bowel or bladder. Denies numbness in groin.  Past Medical History  Diagnosis Date  . Kidney stones 2007  . PUD (peptic ulcer disease)     Has had unspecified surgery for this  . Hyperlipidemia   . Elevated cholesterol   . Hypertension     no current bp meds for last 3 months  . Anemia   . Back pain     Past Surgical History  Procedure Laterality Date  . Surgery for ulcers  1990  . Middle ear surgery  2005    both ears  . Nephrectomy  02/18/2011    Procedure: NEPHRECTOMY;  Surgeon: Hanley Ben, MD;  Location: [facility];  Service: Urology;  Laterality: Right;  . Small intestine surgery      Family History  Problem Relation Age of Onset  . Hyperlipidemia Father   . Heart disease Mother   . Hypertension  Mother   . Hypertension Sister   . Heart disease Brother 50    CABG  . Hyperlipidemia Sister   . Hyperlipidemia Sister     History  Substance Use Topics  . Smoking status: Never Smoker   . Smokeless tobacco: Never Used  . Alcohol Use: No      Review of Systems  Constitutional: Negative for fever, chills and fatigue.  Respiratory: Negative for chest tightness and shortness of breath.   Cardiovascular: Negative for chest pain.  Genitourinary: Negative for dysuria, frequency, hematuria and flank pain.  Musculoskeletal: Positive for myalgias, back pain, arthralgias and gait problem ( pain). Negative for joint swelling.  All other systems reviewed and are negative.    Allergies  Beef-derived products and Pork-derived products  Home Medications   Current Outpatient Rx  Name  Route  Sig  Dispense  Refill  . acetaminophen (TYLENOL) 325 MG tablet   Oral   Take 325 mg by mouth 2 (two) times daily as needed. For pain         . amLODipine (NORVASC) 5 MG tablet   Oral   Take 5 mg by mouth daily.         Marland Kitchen atorvastatin (LIPITOR) 10 MG tablet   Oral   Take 10 mg by mouth at bedtime.         . Camphor-Menthol ([place]  STRENGTH) 11-10 % OINT   Apply externally   Apply 1 application topically daily as needed. For pain. Apply to left leg.         . Cholecalciferol (VITAMIN D) 2000 UNITS CAPS   Oral   Take 1 capsule by mouth every evening.          Marland Kitchen gemfibrozil (LOPID) 600 MG tablet   Oral   Take 600 mg by mouth 2 (two) times daily before a meal.         . nabumetone (RELAFEN) 500 MG tablet   Oral   Take 500 mg by mouth 2 (two) times daily.         . vitamin B-12 (CYANOCOBALAMIN) 1000 MCG tablet   Oral   Take 1,000 mcg by mouth every evening.          . diazepam (VALIUM) 5 MG tablet   Oral   Take 1 tablet (5 mg total) by mouth 2 (two) times daily.   10 tablet   0   . ibuprofen (ADVIL,MOTRIN) 800 MG tablet   Oral   Take 1 tablet (800 mg  total) by mouth 3 (three) times daily.   21 tablet   0   . predniSONE (DELTASONE) 20 MG tablet   Oral   Take 2 tablets (40 mg total) by mouth daily. Take 40 mg by mouth daily for 3 days, then 20mg  by mouth daily for 3 days, then 10mg  daily for 3 days   12 tablet   0     BP 175/91  Pulse 103  Temp(Src) 98 F (36.7 C) (Oral)  Resp 22  SpO2 97%  Physical Exam  Nursing note and vitals reviewed. Constitutional: He appears well-developed and well-nourished. No distress.  HENT:  Head: Normocephalic and atraumatic.  Eyes: Conjunctivae are normal. No scleral icterus.  Neck: Normal range of motion. Neck supple.  No nuchal rigidity or meningeal signs.  No bony tenderness, crepitus, or step-offs  Cardiovascular: Normal rate, regular rhythm and normal heart sounds.   Pulmonary/Chest: Effort normal and breath sounds normal. No respiratory distress. He has no wheezes. He has no rales. He exhibits no tenderness.  Abdominal: Soft. Bowel sounds are normal. He exhibits no distension and no mass. There is no tenderness. There is no rebound and no guarding.  Musculoskeletal: Normal range of motion. He exhibits tenderness. He exhibits no edema.  Neurological: He is alert. He has normal strength. No cranial nerve deficit or sensory deficit. He displays a negative Romberg sign. Gait ( antalgic) abnormal.  Skin: Skin is warm and dry. Rash:   He is not diaphoretic.    ED Course  Procedures (including critical care time)  Labs Reviewed  COMPREHENSIVE METABOLIC PANEL - Abnormal; Notable for the following:    Glucose, Bld 115 (*)    GFR calc non Af Amer 73 (*)    GFR calc Af Amer 84 (*)    All other components within normal limits  URINALYSIS, ROUTINE W REFLEX MICROSCOPIC - Abnormal; Notable for the following:    Protein, ur >300 (*)    All other components within normal limits  CBC WITH DIFFERENTIAL  URINE MICROSCOPIC-ADD ON   No results found.   1. LBP (low back pain)   2. Sciatic pain,  left       MDM  Pt presenting for pain medication and referral for ongoing LBP.  Pt has been diagnosed with herniated discs at L5-S1 and nerve impingement however, based on letter from Dr.  Kritzer, but does not seem to understand diagnosis and option for surgery due to language barrier.  Today pt has not red flag symptoms for back pain.  PE: consistent with findings on MRI and previous diagnosis of herniated disc.  MRI was performed 02/16/12, plain film on 06/18/12 was nl.  Do not believe further imaging is necessary at this time.  Pt has chronic back pain.  See MRI 02/16/12 MR of spine shows: herniated disc L5-S1 moderate to large impingement of L5-S1 and left S1 nerve root impingement.  Pt is seen by Dr. Karie Chimera, [facility]. Pt was seen in August and told if pain medication does not resolve pain, may need to consider surgery. Will provided pt with pain medication and referral to Dr. Hal Neer and Dr. Christella Noa, neurosurgery since it sounds like the pt would prefer a second opinion.     Tx in ED: valium and ibuprofen   Rx: valium, prednisone, and ibuprofen.  Vitals: unremarkable. Discharged homein stable condition.    Discussed pt with Dr. Ashok Cordia during ED encounter.      Noland Fordyce, PA-C 07/21/12 1612

## 2012-07-23 NOTE — ED Provider Notes (Signed)
Medical screening examination/treatment/procedure(s) were conducted as a shared visit with non-physician practitioner(s) and myself.  I personally evaluated the patient during the encounter Pt c/o low back pain. Hx same. No numbness/weakness. Spine nt. No fevers.   Mirna Mires, MD 07/23/12 [phone]

## 2012-08-09 ENCOUNTER — Ambulatory Visit (INDEPENDENT_AMBULATORY_CARE_PROVIDER_SITE_OTHER): Payer: BC Managed Care – PPO | Admitting: Physician Assistant

## 2012-08-09 ENCOUNTER — Encounter: Payer: Self-pay | Admitting: Physician Assistant

## 2012-08-09 VITALS — BP 136/78 | HR 83 | Temp 98.3°F | Resp 16 | Ht 70.0 in | Wt 234.0 lb

## 2012-08-09 DIAGNOSIS — I10 Essential (primary) hypertension: Secondary | ICD-10-CM

## 2012-08-09 DIAGNOSIS — M519 Unspecified thoracic, thoracolumbar and lumbosacral intervertebral disc disorder: Secondary | ICD-10-CM

## 2012-08-09 DIAGNOSIS — M549 Dorsalgia, unspecified: Secondary | ICD-10-CM

## 2012-08-09 DIAGNOSIS — E785 Hyperlipidemia, unspecified: Secondary | ICD-10-CM

## 2012-08-09 MED ORDER — HYDROCODONE-ACETAMINOPHEN 5-325 MG PO TABS: 1.0000 | ORAL_TABLET | Freq: Every evening | ORAL | Status: DC | PRN

## 2012-08-09 NOTE — Progress Notes (Signed)
  Subjective:    Patient ID: [name], male    DOB: 01/18/1966, 47 y.o.   MRN: [id number]  HPI  Patient with hx of HTN, hyperlipidemia and kidney disease (nephrectomy) presents with chronic low back pain radiating down LEFT leg. Pt reports seeing many physicians and was eventually referred to [facility], Dr. Karie Chimera. Lumbar spine MRI shows bulging disc L5-S1 and impingement of surrounding nerves. Surgery was recommended if pain is not relieved with medications. Pt states pain medications do not help and he would like to pursue surgery. His last several attempts to make an apt with Dr. Hal Neer he was told there were no appointments available. He was seen at the ED for this ongoing problem and encouraged to make an apt with one of Dr. Sande Rives partners, when he attempted to do this he was told there were no appointments available. He would like to be referred to a different group at this time to pursue surgical options. States he is unable to get up and down without assistance of table or railing. Numbness/tingling present down leg and into plantar surface of foot. Occasional numbness/tingling in right UE associated with pain in cervical spine. States pain is constant and present every day. Denies: bowel/bladder dysfunction, saddle anesthesia, trauma to back.   Review of Systems Denies: blurry vision, SOB, chest pain, abdominal pain, urinary symptoms.     Objective:   Physical Exam  BP 136/78  Pulse 83  Temp(Src) 98.3 F (36.8 C) (Oral)  Resp 16  Ht 5\' 10"  (1.778 m)  Wt 234 lb (106.142 kg)  BMI 33.58 kg/m2  SpO2 98%  General: WDWN male, appears stated age, NAD, communication is limited by language barrier but patient is well understood and acknowledges his understanding Neck: supple, full ROM without pain, no bony tenderness, no rigidity  Resp: clear to ausculation anterior & posterior fields bilaterally, without rales rhonchi wheezes Cardiac: RRR, no murmurs rubs gallops   Extremities: Moves all limbs spontaneously, full ROM, strength is 5/5 in UE 5/5 in right LE and 4/5 in left LE due to pain, 2+ reflexes in biceps brachioradialis patellar & achilles, dorsalis pedis pulses present and even bilaterally. Spine shows full ROM but pain is present, no bony tenderness, abnormal gait  Neuro: A&O x 3, cranial nerves II-XII grossly intact  Skin: no rashes lesions      Assessment & Plan:  Lumbar disc disease - Plan: Ambulatory referral to Neurosurgery  Back pain - Plan: HYDROcodone-acetaminophen ([place]) 5-325 MG per tablet  Hyperlipidemia  HTN (hypertension)  RTC in August for labs for HTN & hyperlipidemia  Referral to Neurosurgery

## 2012-08-09 NOTE — Patient Instructions (Signed)
If you have not heard anything regarding the referral in 1 week, please contact our office.

## 2012-08-09 NOTE — Progress Notes (Signed)
I have examined this patient along with the student and agree.  DTRs of the lower extremities are symmetric, and Babinski's is down going bilaterally.  We contacted Dr. Sande Rives office and were told that he discontinued care with them.  I suspect that the language barrier contributed in this situation.  Referral made elsewhere today.  Based on records, he needs a lumbar laminectomy. Try hydrocodone at HS, with plain acetaminophen during the day (which helps a little).  He shouldn't take NSAIDS due to having only one kidney. Due for fasting lipids in August 2014.

## 2012-09-11 ENCOUNTER — Other Ambulatory Visit: Payer: Self-pay | Admitting: Neurosurgery

## 2012-09-11 DIAGNOSIS — M545 Low back pain, unspecified: Secondary | ICD-10-CM

## 2012-09-14 ENCOUNTER — Ambulatory Visit
Admission: RE | Admit: 2012-09-14 | Discharge: 2012-09-14 | Disposition: A | Discharge status: Home / Self Care | Payer: BC Managed Care – PPO | Source: Ambulatory Visit | Attending: Neurosurgery | Admitting: Neurosurgery

## 2012-09-14 DIAGNOSIS — M545 Low back pain, unspecified: Secondary | ICD-10-CM

## 2012-10-04 ENCOUNTER — Ambulatory Visit (INDEPENDENT_AMBULATORY_CARE_PROVIDER_SITE_OTHER): Payer: BC Managed Care – PPO | Admitting: Physician Assistant

## 2012-10-04 ENCOUNTER — Encounter: Payer: Self-pay | Admitting: Physician Assistant

## 2012-10-04 VITALS — BP 121/72 | HR 75 | Temp 97.8°F | Resp 16 | Ht 70.0 in | Wt 228.0 lb

## 2012-10-04 DIAGNOSIS — I10 Essential (primary) hypertension: Secondary | ICD-10-CM

## 2012-10-04 DIAGNOSIS — E785 Hyperlipidemia, unspecified: Secondary | ICD-10-CM

## 2012-10-04 DIAGNOSIS — M519 Unspecified thoracic, thoracolumbar and lumbosacral intervertebral disc disorder: Secondary | ICD-10-CM

## 2012-10-04 MED ORDER — ATORVASTATIN CALCIUM 10 MG PO TABS: 10.0000 mg | ORAL_TABLET | Freq: Every day | ORAL | Status: DC

## 2012-10-04 MED ORDER — AMLODIPINE BESYLATE 5 MG PO TABS: 5.0000 mg | ORAL_TABLET | Freq: Every day | ORAL | Status: DC

## 2012-10-04 MED ORDER — GEMFIBROZIL 600 MG PO TABS: 600.0000 mg | ORAL_TABLET | Freq: Two times a day (BID) | ORAL | Status: DC

## 2012-10-04 NOTE — Progress Notes (Signed)
  Subjective:    Patient ID: [name], male    DOB: 23-Feb-1965, 47 y.o.   MRN: [id number]  HPI 47 y.o. Male presents to clinic today for follow up on HTN, hyperlipidemia and chronic low back pain. He is doing well today and states that since his last visit he has felt like a new person. Since 2007 he feels that every day he felt bad waking up with fatigue, headaches and pain and now he has felt much better. He has been seeing Dr. Mordecai Rasmussen for his back pain. He has been prescribed Gabapentin and Tramadol and feels that both are helping a lot. He will continue to follow up with Dr. Mordecai Rasmussen and if at some point these meds are not working they will discuss surgery at that point.   Review of Systems  All other systems reviewed and are negative.      Objective:   Physical Exam  Nursing note and vitals reviewed. Constitutional: He is oriented to person, place, and time. Vital signs are normal. He appears well-developed and well-nourished. No distress.  HENT:  Head: Normocephalic and atraumatic.  Right Ear: External ear normal.  Left Ear: External ear normal.  Nose: Nose normal.  Eyes: Conjunctivae are normal.  Neck: Trachea normal and normal range of motion. Neck supple. No thyromegaly present.  Cardiovascular: Normal rate, regular rhythm, normal heart sounds and intact distal pulses.   Pulmonary/Chest: Effort normal and breath sounds normal.  Abdominal: Soft. Bowel sounds are normal. There is no hepatosplenomegaly. There is no tenderness.  Musculoskeletal: Normal range of motion.  Lymphadenopathy:       Head (right side): No submental, no submandibular and no tonsillar adenopathy present.       Head (left side): No submental, no submandibular and no tonsillar adenopathy present.    He has no cervical adenopathy.       Right: No supraclavicular adenopathy present.       Left: No supraclavicular adenopathy present.  Neurological: He is alert and oriented to person, place, and time. He has  normal strength.  Skin: Skin is warm, dry and intact. No rash noted. He is not diaphoretic. No pallor.  Psychiatric: He has a normal mood and affect. His speech is normal and behavior is normal. Judgment and thought content normal. Cognition and memory are normal.       Assessment & Plan:  Hyperlipidemia - Plan: Lipid panel, gemfibrozil (LOPID) 600 MG tablet, atorvastatin (LIPITOR) 10 MG tablet  HTN (hypertension) - Plan: Comprehensive metabolic panel, amLODipine (NORVASC) 5 MG tablet  Lumbar disc disease- continue to follow up with Dr. Mordecai Rasmussen. Take Gabapentin and Tramadol as prescribed.   Will contact patient with lab results as soon as they are available. Instructed him to continue medications as directed. Will f/u with him in 3-6 months.

## 2012-10-04 NOTE — Progress Notes (Signed)
I have examined this patient along with the student and agree.  

## 2012-10-05 LAB — COMPREHENSIVE METABOLIC PANEL
ALT: 40 U/L (ref 0–53)
AST: 27 U/L (ref 0–37)
Alkaline Phosphatase: 68 U/L (ref 39–117)
Sodium: 141 mEq/L (ref 135–145)
Total Bilirubin: 0.4 mg/dL (ref 0.3–1.2)
Total Protein: 6.9 g/dL (ref 6.0–8.3)

## 2012-10-05 LAB — LIPID PANEL
LDL Cholesterol: 47 mg/dL (ref 0–99)
VLDL: 71 mg/dL — ABNORMAL HIGH (ref 0–40)

## 2012-12-04 ENCOUNTER — Ambulatory Visit: Payer: BC Managed Care – PPO | Attending: Neurosurgery | Admitting: Physical Therapy

## 2012-12-04 DIAGNOSIS — IMO0001 Reserved for inherently not codable concepts without codable children: Secondary | ICD-10-CM | POA: Insufficient documentation

## 2012-12-04 DIAGNOSIS — M545 Low back pain, unspecified: Secondary | ICD-10-CM | POA: Insufficient documentation

## 2012-12-04 DIAGNOSIS — M2560 Stiffness of unspecified joint, not elsewhere classified: Secondary | ICD-10-CM | POA: Insufficient documentation

## 2012-12-12 ENCOUNTER — Ambulatory Visit: Payer: BC Managed Care – PPO | Admitting: Physical Therapy

## 2012-12-13 ENCOUNTER — Ambulatory Visit: Payer: BC Managed Care – PPO | Admitting: Physical Therapy

## 2012-12-14 ENCOUNTER — Encounter: Payer: BC Managed Care – PPO | Admitting: Physical Therapy

## 2012-12-18 ENCOUNTER — Ambulatory Visit: Payer: BC Managed Care – PPO | Attending: Neurosurgery | Admitting: Physical Therapy

## 2012-12-18 DIAGNOSIS — M545 Low back pain, unspecified: Secondary | ICD-10-CM | POA: Insufficient documentation

## 2012-12-18 DIAGNOSIS — M2560 Stiffness of unspecified joint, not elsewhere classified: Secondary | ICD-10-CM | POA: Insufficient documentation

## 2012-12-18 DIAGNOSIS — IMO0001 Reserved for inherently not codable concepts without codable children: Secondary | ICD-10-CM | POA: Insufficient documentation

## 2012-12-19 ENCOUNTER — Encounter: Payer: BC Managed Care – PPO | Admitting: Physical Therapy

## 2012-12-21 ENCOUNTER — Ambulatory Visit: Payer: BC Managed Care – PPO | Admitting: Physical Therapy

## 2012-12-26 ENCOUNTER — Ambulatory Visit: Payer: BC Managed Care – PPO | Admitting: Physical Therapy

## 2012-12-27 ENCOUNTER — Encounter: Payer: Self-pay | Admitting: Physician Assistant

## 2012-12-27 ENCOUNTER — Ambulatory Visit (INDEPENDENT_AMBULATORY_CARE_PROVIDER_SITE_OTHER): Payer: BC Managed Care – PPO | Admitting: Physician Assistant

## 2012-12-27 ENCOUNTER — Ambulatory Visit: Payer: BC Managed Care – PPO | Admitting: Physical Therapy

## 2012-12-27 VITALS — BP 132/88 | HR 86 | Temp 98.7°F | Resp 16 | Ht 70.0 in | Wt 234.0 lb

## 2012-12-27 DIAGNOSIS — Z23 Encounter for immunization: Secondary | ICD-10-CM

## 2012-12-27 DIAGNOSIS — M519 Unspecified thoracic, thoracolumbar and lumbosacral intervertebral disc disorder: Secondary | ICD-10-CM

## 2012-12-27 DIAGNOSIS — I10 Essential (primary) hypertension: Secondary | ICD-10-CM

## 2012-12-27 DIAGNOSIS — E785 Hyperlipidemia, unspecified: Secondary | ICD-10-CM

## 2012-12-27 LAB — CBC WITH DIFFERENTIAL/PLATELET
Basophils Absolute: 0 10*3/uL (ref 0.0–0.1)
Eosinophils Relative: 1 % (ref 0–5)
Lymphocytes Relative: 31 % (ref 12–46)
Lymphs Abs: 2.1 10*3/uL (ref 0.7–4.0)
MCV: 78.9 fL (ref 78.0–100.0)
Neutro Abs: 4.2 10*3/uL (ref 1.7–7.7)
Platelets: 254 10*3/uL (ref 150–400)
RBC: 5.17 MIL/uL (ref 4.22–5.81)
WBC: 6.9 10*3/uL (ref 4.0–10.5)

## 2012-12-27 LAB — COMPREHENSIVE METABOLIC PANEL
ALT: 14 U/L (ref 0–53)
AST: 15 U/L (ref 0–37)
Albumin: 4.7 g/dL (ref 3.5–5.2)
Alkaline Phosphatase: 69 U/L (ref 39–117)
Calcium: 10 mg/dL (ref 8.4–10.5)
Chloride: 106 mEq/L (ref 96–112)
Creat: 1.35 mg/dL (ref 0.50–1.35)
Potassium: 4.2 mEq/L (ref 3.5–5.3)

## 2012-12-27 LAB — LIPID PANEL
LDL Cholesterol: 115 mg/dL — ABNORMAL HIGH (ref 0–99)
Total CHOL/HDL Ratio: 5.5 Ratio

## 2012-12-28 ENCOUNTER — Encounter: Payer: Self-pay | Admitting: Physician Assistant

## 2012-12-28 MED ORDER — ATORVASTATIN CALCIUM 20 MG PO TABS: 20.0000 mg | ORAL_TABLET | Freq: Every day | ORAL | Status: DC

## 2012-12-28 NOTE — Progress Notes (Signed)
  Subjective:    Patient ID: [name], male    DOB: [birth year], 47 y.o.   MRN: [id number]  HPI This 47 y.o. male presents for evaluation of HTN and hyperlipidemia.  His lipids were elevated at his last visit and I advised him to start an OTC fish oil product.  Initially, he did, but then stopped because he wasn't sure he was taking the right thing, and requests clarification on the dose I advised (2-4 grams/day).  There is a language barrier which isn't always apparent, as he answers in English with confidence without indicating that he doesn't really understand the question.  Medications, allergies, past medical history, surgical history, family history, social history and problem list reviewed.  Review of Systems No CP, SOB, HA, dizziness.  He still has back pain and is undergoing therapy and treatments for that with neurosurgery.    Objective:   Physical Exam Blood pressure 132/88, pulse 86, temperature 98.7 F (37.1 C), temperature source Oral, resp. rate 16, height 5\' 10"  (1.778 m), weight 234 lb (106.142 kg), SpO2 98.00%. Body mass index is 33.58 kg/(m^2). Well-developed, well nourished male who is awake, alert and oriented, in NAD. He has therapeutic taping on the back and both shoulders consistent with his. HEENT: [place]/AT, sclera and conjunctiva are clear.   Neck: supple, non-tender, no lymphadenopathy, thyromegaly. Heart: RRR, no murmur Lungs: normal effort, CTA Extremities: no cyanosis, clubbing or edema. Skin: warm and dry without rash. Psychologic: good mood and appropriate affect, normal speech and behavior.        Assessment & Plan:  Hyperlipidemia - Plan: Comprehensive metabolic panel, Lipid panel; provided recommended Fish Oil dose again in writing and will advise again with the results of the labs drawn today. It may be easier to increase the Lipitor from 10 mg to 20 mg, but would not use a higher dose due to his use of amlodipine.  HTN (hypertension) - Plan: CBC with  Differential; Continue amlodipine.  If Lipitor dose needs to increase above 20 mg, consider changing to ACEI.  Lumbar disc disease - continue neurontin and therapy as directed by specialist.  Need for influenza vaccination - the patient refused this today.  Fara Chute, PA-C Physician Assistant-Certified [facility]

## 2012-12-28 NOTE — Addendum Note (Signed)
Addended by: Fara Chute on: 12/28/2012 09:47 AM   Modules accepted: Orders

## 2012-12-31 ENCOUNTER — Ambulatory Visit: Payer: BC Managed Care – PPO | Admitting: Physical Therapy

## 2013-01-04 ENCOUNTER — Ambulatory Visit: Payer: BC Managed Care – PPO | Admitting: Physical Therapy

## 2013-01-09 ENCOUNTER — Ambulatory Visit: Payer: BC Managed Care – PPO | Admitting: Physical Therapy

## 2013-01-14 ENCOUNTER — Ambulatory Visit: Payer: BC Managed Care – PPO | Attending: Neurosurgery | Admitting: Physical Therapy

## 2013-01-14 DIAGNOSIS — M2560 Stiffness of unspecified joint, not elsewhere classified: Secondary | ICD-10-CM | POA: Insufficient documentation

## 2013-01-14 DIAGNOSIS — IMO0001 Reserved for inherently not codable concepts without codable children: Secondary | ICD-10-CM | POA: Insufficient documentation

## 2013-01-14 DIAGNOSIS — M545 Low back pain, unspecified: Secondary | ICD-10-CM | POA: Insufficient documentation

## 2013-01-16 ENCOUNTER — Encounter: Payer: BC Managed Care – PPO | Admitting: Physical Therapy

## 2013-01-18 ENCOUNTER — Encounter: Payer: BC Managed Care – PPO | Admitting: Physical Therapy

## 2013-01-23 ENCOUNTER — Encounter: Payer: BC Managed Care – PPO | Admitting: Physical Therapy

## 2013-01-24 ENCOUNTER — Encounter: Payer: BC Managed Care – PPO | Admitting: Physical Therapy

## 2013-02-26 ENCOUNTER — Encounter: Payer: Self-pay | Admitting: Physician Assistant

## 2013-02-26 DIAGNOSIS — M5416 Radiculopathy, lumbar region: Secondary | ICD-10-CM

## 2013-03-12 ENCOUNTER — Encounter: Payer: Self-pay | Admitting: Physician Assistant

## 2013-03-12 ENCOUNTER — Ambulatory Visit (INDEPENDENT_AMBULATORY_CARE_PROVIDER_SITE_OTHER): Payer: 59 | Admitting: Physician Assistant

## 2013-03-12 VITALS — BP 119/76 | HR 73 | Temp 98.0°F | Resp 16 | Ht 70.0 in | Wt 230.0 lb

## 2013-03-12 DIAGNOSIS — R300 Dysuria: Secondary | ICD-10-CM

## 2013-03-12 DIAGNOSIS — E785 Hyperlipidemia, unspecified: Secondary | ICD-10-CM

## 2013-03-12 DIAGNOSIS — I10 Essential (primary) hypertension: Secondary | ICD-10-CM

## 2013-03-12 DIAGNOSIS — R079 Chest pain, unspecified: Secondary | ICD-10-CM

## 2013-03-12 DIAGNOSIS — M519 Unspecified thoracic, thoracolumbar and lumbosacral intervertebral disc disorder: Secondary | ICD-10-CM

## 2013-03-12 LAB — COMPREHENSIVE METABOLIC PANEL
ALT: 10 U/L (ref 0–53)
AST: 16 U/L (ref 0–37)
Albumin: 4.3 g/dL (ref 3.5–5.2)
Alkaline Phosphatase: 63 U/L (ref 39–117)
BILIRUBIN TOTAL: 0.5 mg/dL (ref 0.3–1.2)
BUN: 18 mg/dL (ref 6–23)
CO2: 27 meq/L (ref 19–32)
CREATININE: 1.21 mg/dL (ref 0.50–1.35)
Calcium: 9.7 mg/dL (ref 8.4–10.5)
Chloride: 105 mEq/L (ref 96–112)
Glucose, Bld: 100 mg/dL — ABNORMAL HIGH (ref 70–99)
Potassium: 4.3 mEq/L (ref 3.5–5.3)
Sodium: 140 mEq/L (ref 135–145)
Total Protein: 6.8 g/dL (ref 6.0–8.3)

## 2013-03-12 LAB — LIPID PANEL
CHOL/HDL RATIO: 6.3 ratio
CHOLESTEROL: 215 mg/dL — AB (ref 0–200)
HDL: 34 mg/dL — AB (ref >39)
Triglycerides: 574 mg/dL — ABNORMAL HIGH (ref <150)

## 2013-03-12 LAB — CBC WITH DIFFERENTIAL/PLATELET
BASOS ABS: 0 10*3/uL (ref 0.0–0.1)
BASOS PCT: 0 % (ref 0–1)
EOS ABS: 0.1 10*3/uL (ref 0.0–0.7)
EOS PCT: 1 % (ref 0–5)
HCT: 42 % (ref 39.0–52.0)
Hemoglobin: 14.5 g/dL (ref 13.0–17.0)
Lymphocytes Relative: 31 % (ref 12–46)
Lymphs Abs: 1.6 10*3/uL (ref 0.7–4.0)
MCH: 28.4 pg (ref 26.0–34.0)
MCHC: 34.5 g/dL (ref 30.0–36.0)
MCV: 82.2 fL (ref 78.0–100.0)
Monocytes Absolute: 0.3 10*3/uL (ref 0.1–1.0)
Monocytes Relative: 7 % (ref 3–12)
Neutro Abs: 3.1 10*3/uL (ref 1.7–7.7)
Neutrophils Relative %: 61 % (ref 43–77)
PLATELETS: 207 10*3/uL (ref 150–400)
RBC: 5.11 MIL/uL (ref 4.22–5.81)
RDW: 13.4 % (ref 11.5–15.5)
WBC: 5 10*3/uL (ref 4.0–10.5)

## 2013-03-12 LAB — POCT UA - MICROSCOPIC ONLY
CASTS, UR, LPF, POC: NEGATIVE
Crystals, Ur, HPF, POC: NEGATIVE
Mucus, UA: POSITIVE
YEAST UA: NEGATIVE

## 2013-03-12 LAB — POCT URINALYSIS DIPSTICK
Bilirubin, UA: NEGATIVE
GLUCOSE UA: NEGATIVE
Ketones, UA: NEGATIVE
Leukocytes, UA: NEGATIVE
NITRITE UA: NEGATIVE
Protein, UA: 100
RBC UA: NEGATIVE
Spec Grav, UA: 1.025
UROBILINOGEN UA: 0.2
pH, UA: 6

## 2013-03-12 LAB — GLUCOSE, POCT (MANUAL RESULT ENTRY): POC GLUCOSE: 90 mg/dL (ref 70–99)

## 2013-03-12 MED ORDER — OMEPRAZOLE 40 MG PO CPDR: 40.0000 mg | DELAYED_RELEASE_CAPSULE | Freq: Every day | ORAL | Status: DC

## 2013-03-12 NOTE — Progress Notes (Signed)
Subjective:    Patient ID: [name], male    DOB: February 04, 1966, 48 y.o.   MRN: [id number]  PCP: Ellsworth Lennox, MD  Chief Complaint  Patient presents with  . Follow-up    chest pain, SOB, fatigue, especially after eating. Happens "all the time"    Medications, allergies, past medical history, surgical history, family history, social history and problem list reviewed and updated.  HPI Presents for evaluation of Chest pain that occurred during PT exercises for his back.  It is my understanding that someone from the PT office called on 02/20/2013 and scheduled this appointment. I do not have any notes from the PT, as his referral to them came from the neurosurgeon.  There is a significant language barrier, and he often nods and replies, "yes," to questions, when I suspect that he didn't understand me. He reports that he gets very tired, especially in the back, at PT, and that he sometimes experiences chest pain with the exercises, and sometimes also with eating.    He goes on to report lots of phlegm every morning.  Coughs it up from the back of the throat.  In the evenings, he reports blowing a lot of mucous, with blood, from the nose.  The mucous is dark yellow. He is not taking Gemfibrozil, reports it makes him feel tired. He has Atorvastatin (Lipitor) 10 mg and 20 mg in his bag.  Reports that the 10 mg gives him HA, and that he takes the 20 mg inconsistently, not realizing they are the same thing. He indicates that he takes the amlodipine every day, and the tramadol sometimes. He reports that when his ears are itchy, he cleans them out and gets yellow material on the swab. He shows me a resolving lesion on the tip of the LEFT middle finger and asks if it's OK.  Review of Systems As above.  No SOB, dizziness, nausea, vomiting, diarrhea, constipation.  No new muscle or joint aches.  No rash.  He reports that he has some dysuria, that starting his stream is hard, and sometimes painful. No  hematuria, penile discharge, urinary urgency or frequency.    Objective:   Physical Exam Blood pressure 119/76, pulse 73, temperature 98 F (36.7 C), resp. rate 16, height 5\' 10"  (1.778 m), weight 230 lb (104.327 kg), SpO2 96.00%. Body mass index is 33 kg/(m^2). Well-developed, well nourished man who is awake, alert and oriented, in NAD. HEENT: [place]/AT, PERRL, EOMI.  Sclera and conjunctiva are clear.  EAC are patent, TMs are normal in appearance. Nasal mucosa is pink and moist. OP is clear. Neck: supple, non-tender, no lymphadenopathy, thyromegaly. Heart: RRR, no murmur Lungs: normal effort, CTA Abdomen: normo-active bowel sounds, supple, non-tender, no mass or organomegaly. Extremities: no cyanosis, clubbing or edema. Skin: warm and dry without rash. Psychologic: good mood and appropriate affect, normal speech and behavior.   Results for orders placed in visit on 03/12/13  POCT UA - MICROSCOPIC ONLY      Result Value Range   WBC, Ur, HPF, POC 2-8     RBC, urine, microscopic 0-2     Bacteria, U Microscopic 1+     Mucus, UA pos     Epithelial cells, urine per micros 0-1     Crystals, Ur, HPF, POC neg     Casts, Ur, LPF, POC neg     Yeast, UA neg    POCT URINALYSIS DIPSTICK      Result Value Range   Color, UA yellow  Clarity, UA clear     Glucose, UA neg     Bilirubin, UA neg     Ketones, UA neg     Spec Grav, UA 1.025     Blood, UA neg     pH, UA 6.0     Protein, UA 100     Urobilinogen, UA 0.2     Nitrite, UA neg     Leukocytes, UA Negative    GLUCOSE, POCT (MANUAL RESULT ENTRY)      Result Value Range   POC Glucose 90  70 - 99 mg/dl   EKG reviewed with Dr. Everlene Farrier.  NSR. No ischemia, LVH or dysrhythmia.     Assessment & Plan:  1. Chest pain Possibly GERD, but given the language barrier, believe that cardiology evaluation is important. - EKG 12-Lead - Ambulatory referral to Cardiology - omeprazole (PRILOSEC) 40 MG capsule; Take 1 capsule (40 mg total) by mouth  daily.  Dispense: 90 capsule; Refill: 1  2. Dysuria If Cx is negative, consider treatment for BPH. - POCT UA - Microscopic Only - POCT urinalysis dipstick - Urine culture - POCT glucose (manual entry)  3. Hyperlipidemia Await results.  I'm not certain that the gemfibrozil caused him to feel bad, but if it doesn't need to be re-started, he can continue of atorvastatin.  I've encouraged him to take the 20 mg every day. - Lipid panel  4. HTN (hypertension) Controlled. Continue amlodipine.  - CBC with Differential - Comprehensive metabolic panel  5. Lumbar disc disease Continue follow-up with neurosurgery per their instructions.   Fara Chute, PA-C Physician Assistant-Certified [facility]

## 2013-03-12 NOTE — Patient Instructions (Addendum)
Please take the atorvastatin, amlodipine and omeprazole every single day. I have referred you to a cardiologist.  That office will call you to schedule an appointment to evaluate your heart. Please drink 64 ounces of water every day.  I will contact you with your lab results as soon as they are available.   If you have not heard from me in 2 weeks, please contact me.  The fastest way to get your results is to register for My Chart (see the instructions on the last page of this printout).

## 2013-03-13 LAB — URINE CULTURE
Colony Count: NO GROWTH
ORGANISM ID, BACTERIA: NO GROWTH

## 2013-03-14 ENCOUNTER — Encounter: Payer: Self-pay | Admitting: Physician Assistant

## 2013-03-14 ENCOUNTER — Other Ambulatory Visit: Payer: Self-pay | Admitting: *Deleted

## 2013-03-14 DIAGNOSIS — D62 Acute posthemorrhagic anemia: Secondary | ICD-10-CM

## 2013-03-14 DIAGNOSIS — N200 Calculus of kidney: Secondary | ICD-10-CM

## 2013-03-14 MED ORDER — TAMSULOSIN HCL 0.4 MG PO CAPS: 0.4000 mg | ORAL_CAPSULE | Freq: Every evening | ORAL | Status: DC

## 2013-03-21 ENCOUNTER — Ambulatory Visit: Payer: BC Managed Care – PPO | Admitting: Physician Assistant

## 2013-03-29 ENCOUNTER — Ambulatory Visit (INDEPENDENT_AMBULATORY_CARE_PROVIDER_SITE_OTHER): Payer: 59 | Admitting: Cardiology

## 2013-03-29 ENCOUNTER — Encounter: Payer: Self-pay | Admitting: Cardiology

## 2013-03-29 VITALS — BP 140/86 | HR 72 | Wt 236.0 lb

## 2013-03-29 DIAGNOSIS — R079 Chest pain, unspecified: Secondary | ICD-10-CM

## 2013-03-29 DIAGNOSIS — E785 Hyperlipidemia, unspecified: Secondary | ICD-10-CM

## 2013-03-29 DIAGNOSIS — I10 Essential (primary) hypertension: Secondary | ICD-10-CM

## 2013-03-29 MED ORDER — PREGABALIN 75 MG PO CAPS: 75.0000 mg | ORAL_CAPSULE | Freq: Two times a day (BID) | ORAL | Status: DC

## 2013-03-29 NOTE — Patient Instructions (Addendum)
Your physician has requested that you have en exercise stress myoview. For further information please visit [email or web address]. Please follow instruction sheet, as given.  Your physician has recommended you make the following change in your medication:  1. STOP GABAPENTIN 2. START LYRICA 75 MG  TWICE DAILY   You have been referred to [place]    Your physician recommends that you continue on your current medications as directed. Please refer to the Current Medication list given to you today.  Your physician recommends that you schedule a follow-up appointment in: 2 months with Dr. [name]

## 2013-03-29 NOTE — Progress Notes (Signed)
Patient Name: [name] Date of Encounter: 03/29/2013  Primary Care Provider:  Ellsworth Lennox, MD Primary Cardiologist:  Ena Dawley, H  Problem List   Past Medical History  Diagnosis Date  . Kidney stones 2007  . PUD (peptic ulcer disease)     Has had unspecified surgery for this  . Hyperlipidemia   . Elevated cholesterol   . Hypertension     no current bp meds for last 3 months  . Anemia   . Back pain    Past Surgical History  Procedure Laterality Date  . Surgery for ulcers  1990  . Middle ear surgery  2005    both ears  . Nephrectomy  02/18/2011    Procedure: NEPHRECTOMY;  Surgeon: Hanley Ben, MD;  Location: [facility];  Service: Urology;  Laterality: Right;  . Small intestine surgery      Allergies  Allergies  Allergen Reactions  . Beef-Derived Products     Cultural preference  . Pork-Derived Products     Cultural preference    HPI  48 yo from [place].  He was seen by Dr. Carolyne Littles 20 years ago for concern of fatigue and leg pain. His echocardiogram was unremarkable and he's ABIs were normal. His pain was attributed to neuropathic pain and patient is being controlled by pain killers and high doses of gabapentin. Chronic fatigue was Thought to be from bad kidney but had right nephrectomy and still feels poorly.  Overweight with exertional dyspnea.  No chf or PND/orhtopnea. Has chronic back pain with sciatica secondary to disc protrusion.   His TSH was normal.  The patient is coming 2 years later he still complains of profound fatigue. He also complains of lower retrosternal/epigastric pain that occurs after eating any meal. The patient has a history on gastric ulcer back in 1995 in [place] for which he underwent a gastric surgery. He states that he saw a GI doctor here in [place] in the past but not for last few years. He denies any recent bloody or black tarry stools or weight loss. He denies exertional chest pain. Sometimes his pain is attribute it to  beats inspiration and fatigue. He denies any palpitations or syncope.  Home Medications  Prior to Admission medications   Medication Sig Start Date End Date Taking? Authorizing Provider  acetaminophen (TYLENOL) 325 MG tablet Take 325 mg by mouth 2 (two) times daily as needed. For pain    Historical Provider, MD  amLODipine (NORVASC) 5 MG tablet Take 1 tablet (5 mg total) by mouth daily. 10/04/12   Chelle S Jeffery, PA-C  atorvastatin (LIPITOR) 20 MG tablet Take 1 tablet (20 mg total) by mouth daily. 12/28/12   Chelle S Jeffery, PA-C  gabapentin (NEURONTIN) 300 MG capsule Take 300 mg by mouth 3 (three) times daily.    Historical Provider, MD  gemfibrozil (LOPID) 600 MG tablet Take 1 tablet (600 mg total) by mouth 2 (two) times daily before a meal. 10/04/12   Chelle S Jeffery, PA-C  omeprazole (PRILOSEC) 40 MG capsule Take 1 capsule (40 mg total) by mouth daily. 03/12/13   Chelle Janalee Dane, PA-C  tamsulosin (FLOMAX) 0.4 MG CAPS capsule Take 1 capsule (0.4 mg total) by mouth every evening. 03/14/13   Chelle Janalee Dane, PA-C  traMADol (ULTRAM) 50 MG tablet  09/11/12   Historical Provider, MD  vitamin B-12 (CYANOCOBALAMIN) 1000 MCG tablet Take 1,000 mcg by mouth every evening.     Historical Provider, MD    Family History  Family History  Problem Relation Age of Onset  . Hyperlipidemia Father   . Heart disease Mother   . Hypertension Mother   . Hypertension Sister   . Heart disease Brother 54    CABG  . Hyperlipidemia Sister   . Hyperlipidemia Sister     Social History  History   Social History  . Marital Status: Married    Spouse Name: N/A    Number of Children: 4  . Years of Education: N/A   Occupational History  .  [employer]   Social History Main Topics  . Smoking status: Never Smoker   . Smokeless tobacco: Never Used  . Alcohol Use: No  . Drug Use: No  . Sexual Activity: Yes   Other Topics Concern  . Not on file   Social History Narrative   Lives at home with  wife and family. He is from [place]. Came to the [place] in 2002.     Review of Systems, as per HPI, otherwise negative General:  No chills, fever, night sweats or weight changes.  Cardiovascular:  No chest pain, dyspnea on exertion, edema, orthopnea, palpitations, paroxysmal nocturnal dyspnea. Dermatological: No rash, lesions/masses Respiratory: No cough, dyspnea Urologic: No hematuria, dysuria Abdominal:   No nausea, vomiting, diarrhea, bright red blood per rectum, melena, or hematemesis Neurologic:  No visual changes, wkns, changes in mental status. All other systems reviewed and are otherwise negative except as noted above.  Physical Exam  BP 140/86 mmHg, HR 72 BPM General: Pleasant, NAD Psych: Normal affect. Neuro: Alert and oriented X 3. Moves all extremities spontaneously. HEENT: Normal  Neck: Supple without bruits or JVD. Lungs:  Resp regular and unlabored, CTA. Heart: RRR no s3, s4, or murmurs. Abdomen: Soft, non-tender, non-distended, BS + x 4.  Extremities: No clubbing, cyanosis or edema. DP/PT/Radials 2+ and equal bilaterally.  Labs:  No results found for this basename: CKTOTAL, CKMB, TROPONINI,  in the last 72 hours Lab Results  Component Value Date   WBC 5.0 03/12/2013   HGB 14.5 03/12/2013   HCT 42.0 03/12/2013   MCV 82.2 03/12/2013   PLT 207 03/12/2013   No results found for this basename: NA, K, CL, CO2, BUN, CREATININE, CALCIUM, LABALBU, PROT, BILITOT, ALKPHOS, ALT, AST, GLUCOSE,  in the last 168 hours Lab Results  Component Value Date   CHOL 215* 03/12/2013   HDL 34* 03/12/2013   LDLCALC Comment:   Not calculated due to Triglyceride >400. Suggest ordering Direct LDL (Unit Code: [phone]).   Total Cholesterol/HDL Ratio:CHD Risk                        Coronary Heart Disease Risk Table                                        Men       Women          1/2 Average Risk              3.4        3.3              Average Risk              5.0        4.4           2X Average Risk  9.6        7.1           3X Average Risk             23.4       11.0 Use the calculated Patient Ratio above and the CHD Risk table  to determine the patient's CHD Risk. ATP III Classification (LDL):       < 100        mg/dL         Optimal      100 - 129     mg/dL         Near or Above Optimal      130 - 159     mg/dL         Borderline High      160 - 189     mg/dL         High       > 190        mg/dL         Very High   03/12/2013   TRIG 574* 03/12/2013    Accessory Clinical Findings  Echocardiogram 12/01/2011 - Left ventricle: The cavity size was normal. Systolic function was normal. The estimated ejection fraction was in the range of 60% to 65%. Wall motion was normal; there were no regional wall motion abnormalities. - Pulmonary arteries: PA peak pressure: 68mm Hg (S).  ECG - on 03/15/2013 normal EKG.   Assessment & Plan  A 48 year old male  1. Atypical chest pain - this gentleman history of prior gastric ulcers and he is pain being related to food intake, most probably represents a GI problem, however he has multiple risk factors including severely elevated lipids, obesity, inactivity, impaired glucose tolerance and hypertension. We'll order an exercise nuclear stress test to rule out ischemia especially if he will need a surgery in the near future as a preop evaluation. He is also explained that he needs to see his GI doctor rather sooner than later.  2. Hypertension - borderline today since this is her first visit we'll reevaluate at the next visit also we'll see response at the stress test.  3. Hyperlipidemia - patient is currently takingLipitor 30 mg daily,  he still has significantly elevated LDL and triglycerides, we'll refer him for a lipid clinic for potential enrollment in the trial where the patient receive an additional CPSK inhibitors in addition to moderate to high dose of statin.  4. Fatigue - potential cause is high dose of gabapentin to we will discontinue and  start on once daily 75 mg of Lyrica.  5. LE extremity pain - Normal ABIs in 2013,  Neuropathic in origin. We will start Lyrica   Follow up in 2 month  Ena Dawley, Lemmie Evens, MD, [facility] 03/29/2013, 11:10 AM

## 2013-03-29 NOTE — Addendum Note (Signed)
Addended by: Elberta Leatherwood R on: 03/29/2013 12:30 PM   Modules accepted: Orders

## 2013-04-17 ENCOUNTER — Ambulatory Visit (INDEPENDENT_AMBULATORY_CARE_PROVIDER_SITE_OTHER): Payer: 59 | Admitting: Pharmacist

## 2013-04-17 ENCOUNTER — Ambulatory Visit (HOSPITAL_COMMUNITY): Payer: 59 | Attending: Internal Medicine | Admitting: Radiology

## 2013-04-17 VITALS — BP 125/85 | Ht 70.0 in | Wt 232.0 lb

## 2013-04-17 VITALS — Wt 231.0 lb

## 2013-04-17 DIAGNOSIS — R079 Chest pain, unspecified: Secondary | ICD-10-CM

## 2013-04-17 DIAGNOSIS — R0600 Dyspnea, unspecified: Secondary | ICD-10-CM

## 2013-04-17 DIAGNOSIS — Z79899 Other long term (current) drug therapy: Secondary | ICD-10-CM

## 2013-04-17 DIAGNOSIS — Z8249 Family history of ischemic heart disease and other diseases of the circulatory system: Secondary | ICD-10-CM | POA: Insufficient documentation

## 2013-04-17 DIAGNOSIS — E785 Hyperlipidemia, unspecified: Secondary | ICD-10-CM | POA: Insufficient documentation

## 2013-04-17 DIAGNOSIS — I10 Essential (primary) hypertension: Secondary | ICD-10-CM | POA: Insufficient documentation

## 2013-04-17 DIAGNOSIS — R0602 Shortness of breath: Secondary | ICD-10-CM

## 2013-04-17 MED ORDER — TECHNETIUM TC 99M SESTAMIBI GENERIC - CARDIOLITE
10.0000 | Freq: Once | INTRAVENOUS | Status: AC | PRN
  Administered 2013-04-17: 10 via INTRAVENOUS

## 2013-04-17 MED ORDER — TECHNETIUM TC 99M SESTAMIBI GENERIC - CARDIOLITE
30.0000 | Freq: Once | INTRAVENOUS | Status: AC | PRN
  Administered 2013-04-17: 30 via INTRAVENOUS

## 2013-04-17 MED ORDER — FENOFIBRATE 160 MG PO TABS
ORAL_TABLET | ORAL | Status: DC
Start: 2013-04-17 — End: 2013-05-07

## 2013-04-17 NOTE — Progress Notes (Signed)
Patient is a 48 y.o. Male who moved here from [place] in 2002, being referred to lipid clinic by Dr. [name] given h/o elevated TG and non-HDL despite taking atorvastatin 20 mg qd.  Patient was put on gemfibrozil in the past by his PCP but it made him feel tired, so he is no longer taking this.  He also states that fish oil made him feel tired, so he stopped taking this in the past. His brother recently had CABG, and his mother also has h/o CAD. Patient takes tramadol and Lyrica for chronic pain.  His TG were 574 mg/dL in 02/2013 on atorvastatin 20 mg qd.  He denies alcohol use.  He does not eat a lot of desserts or drink sodas.  It doesn't seem he ever tried fenofibrate before.  RF:  HTN, family h/o CAD, low HDL, metabolic syndrome - LDL goal preferably < 100, non-HDL goal < 130 Meds:  Atorvastatin 20 mg qd Intolerant:  Patient states both gemfibrozil and fish oil make him feel tired soon after he takes them.  Social history:  Denies alcohol use.  Denies tobacco use.  Moved here from [place] 2002. Family history:  Brother - CABG (36 y.o.), Mother- CAD, HTN Exercise:  No regular aerobic activity.  Chronic pain makes this difficult. Diet:  Difficult to get a great sense of his routine diet due to language barrier, however it seems patient eats a lot of chicken and vegetables.  He drinks 1 soda per day at the most, and rarely eats fried foods.  He does eat carbohydrates regularly (bread, pasta, rice, potatoes).    Labs:   02/2013:  TC 215, TG 574, LDL n/a (due to elevated TG), HDL 34, CrCl 100 ml/min, Scr 1.15 (On atorvastatin 20 mg qd) 07/2011:  TG 1327 (not on meds)  Current Outpatient Prescriptions  Medication Sig Dispense Refill  . acetaminophen (TYLENOL) 325 MG tablet Take 325 mg by mouth 2 (two) times daily as needed. For pain      . amLODipine (NORVASC) 5 MG tablet Take 1 tablet (5 mg total) by mouth daily.  30 tablet  5  . atorvastatin (LIPITOR) 20 MG tablet Take 1 tablet (20 mg total) by mouth  daily.  90 tablet  3  . omeprazole (PRILOSEC) 40 MG capsule Take 1 capsule (40 mg total) by mouth daily.  90 capsule  1  . pregabalin (LYRICA) 75 MG capsule Take 1 capsule (75 mg total) by mouth 2 (two) times daily.  60 capsule  4  . tamsulosin (FLOMAX) 0.4 MG CAPS capsule Take 1 capsule (0.4 mg total) by mouth every evening.  30 capsule  2  . traMADol (ULTRAM) 50 MG tablet       . vitamin B-12 (CYANOCOBALAMIN) 1000 MCG tablet Take 1,000 mcg by mouth every evening.        No current facility-administered medications for this visit.   Allergies  Allergen Reactions  . Beef-Derived Products     Cultural preference  . Pork-Derived Products     Cultural preference   Family History  Problem Relation Age of Onset  . Hyperlipidemia Father   . Heart disease Mother   . Hypertension Mother   . Hypertension Sister   . Heart disease Brother 56    CABG  . Hyperlipidemia Sister   . Hyperlipidemia Sister

## 2013-04-17 NOTE — Progress Notes (Signed)
[facility] [address] [place], [place] [address] [phone]    Cardiology Nuclear Med Study  [name] is a 48 y.o. male     MRN : [id number]     DOB: Oct 13, 1965  Procedure Date: 04/17/2013  Nuclear Med Background Indication for Stress Test:  Evaluation for Ischemia History: Echo 12/01/11 EF:60-65%, No Known History of CAD: Cardiac Risk Factors: Family History - CAD, Hypertension and Lipids  Symptoms:  Chest Pain and SOB   Nuclear Pre-Procedure Caffeine/Decaff Intake:  None NPO After: 8:00pm   Lungs:  clear O2 Sat: 97% on room air. IV 0.9% NS with Angio Cath:  22g  IV Site: R Hand  IV Started by:  Crissie Figures, RN  Chest Size (in):  46 Cup Size: n/a  Height: 5\' 10"  (1.778 m)  Weight:  232 lb (105.235 kg)  BMI:  Body mass index is 33.29 kg/(m^2). Tech Comments:  N/A    Nuclear Med Study 1 or 2 day study: 1 day  Stress Test Type:  Stress  Reading MD: N/A  Order Authorizing Provider:  Ena Dawley, MD  Resting Radionuclide: Technetium 44m Sestamibi  Resting Radionuclide Dose: 11.0 mCi   Stress Radionuclide:  Technetium 29m Sestamibi  Stress Radionuclide Dose: 33.0 mCi           Stress Protocol Rest HR: 74 Stress HR: 148  Rest BP: 125/85 Stress BP: 159/63  Exercise Time (min): 9:01 METS: 10.10   Predicted Max HR: 172 bpm % Max HR: 86.05 bpm Rate Pressure Product: 23532   Dose of Adenosine (mg):  n/a Dose of Lexiscan: n/a mg  Dose of Atropine (mg): n/a Dose of Dobutamine: n/a mcg/kg/min (at max HR)  Stress Test Technologist: Ileene Hutchinson, EMT-P  Nuclear Technologist:  Charlton Amor, CNMT     Rest Procedure:  Myocardial perfusion imaging was performed at rest 45 minutes following the intravenous administration of Technetium 57m Sestamibi. Rest ECG:  Normal sinus rhythm. Normal EKG  Stress Procedure:  The patient exercised on the treadmill utilizing the Bruce Protocol for 9:01 minutes. The patient stopped due to fatigue/sob and  denied any chest pain.  Technetium 49m Sestamibi was injected at peak exercise and myocardial perfusion imaging was performed after a brief delay. Stress ECG: Normal sinus rhythm. Normal EKG  QPS Raw Data Images:  Normal; no motion artifact; normal heart/lung ratio. Stress Images:  Normal homogeneous uptake in all areas of the myocardium. Rest Images:  Normal homogeneous uptake in all areas of the myocardium. Subtraction (SDS):  No evidence of ischemia. Transient Ischemic Dilatation (Normal <1.22):  0.93 Lung/Heart Ratio (Normal <0.45):  0.34  Quantitative Gated Spect Images QGS EDV:  114 ml QGS ESV:  42 ml  Impression Exercise Capacity:  Fairly good exercise capacity. BP Response:  Normal blood pressure response. Clinical Symptoms:  Fatigue ECG Impression:  No significant ST segment change suggestive of ischemia. Comparison with Prior Nuclear Study: No images to compare  Overall Impression:  Normal stress nuclear study. This is a low risk scan. There is no scar or ischemia.  LV Ejection Fraction: 63%.  LV Wall Motion:  Normal Wall Motion.  Dola Argyle, MD

## 2013-04-17 NOTE — Patient Instructions (Addendum)
1.  Stop Gemfibrozil (this is a pill you were taking twice daily) - you may not have this at home. 2.  Start taking fenofibrate 160 mg once daily with largest meal of the day.   I called this into pharmacy.  I also told them to remove gemfibrozil from med list to make sure he didn't accidentally take this as well. 3.  Continue atorvastatin 20 mg daily as you are already on this.  4.  Limit breads, pasta, rice, potato in diet. 5.  Recheck cholesterol, liver, and kidney function in 8 weeks (06/17/13 - fasting lab).  See Ysidro Evert 1 day later to review (06/18/13 at 9:30 am)

## 2013-04-17 NOTE — Assessment & Plan Note (Addendum)
Patient currently only taking atorvastatin 20 mg qd for lipids.  I reviewed his medications he brought with him to the visit, and he is not taking gemfibrozil, so will start a fibrate today.  Will use fenofibrate given it also reduces LDL effectively, it is safer with statins, and because he already has c/o feeling tired on gemfibrozil.  I called the pharmacy to make sure they removed gemfibrozil from his med list so he wouldn't accidentally take both of the fibrates.  Patient states he understands to take both atorvastatin and fenofibrate once daily.  Renal function is good, so will use fenofibrate 160 mg qd with largest meal of the day.  Patient to limit carbohydrates in his diet in hopes of improving TG further through diet.  Will recheck blood work in 2 months, and see me after. Plan: 1.  Stop Gemfibrozil (this is a pill you were taking twice daily) - you may not have this at home. 2.  Start taking fenofibrate 160 mg once daily with largest meal of the day.   I called this into pharmacy.  I also told them to remove gemfibrozil from med list to make sure he didn't accidentally take this as well. 3.  Continue atorvastatin 20 mg daily as you are already on this.  4.  Limit breads, pasta, rice, potato in diet. 5.  Recheck cholesterol, liver, and kidney function in 8 weeks (06/17/13 - fasting lab).  See Ysidro Evert 1 day later to review (06/18/13 at 9:30 am)

## 2013-04-19 ENCOUNTER — Other Ambulatory Visit: Payer: Self-pay | Admitting: Physician Assistant

## 2013-04-22 ENCOUNTER — Telehealth: Payer: Self-pay | Admitting: Cardiology

## 2013-04-22 NOTE — Telephone Encounter (Signed)
Follow up    Pt returning our call please give him a call back.

## 2013-04-22 NOTE — Telephone Encounter (Signed)
Returned call to patient myoview results given. 

## 2013-04-23 ENCOUNTER — Other Ambulatory Visit: Payer: Self-pay | Admitting: *Deleted

## 2013-04-23 MED ORDER — AMLODIPINE BESYLATE 5 MG PO TABS: ORAL_TABLET | ORAL | Status: DC

## 2013-04-30 ENCOUNTER — Other Ambulatory Visit: Payer: Self-pay | Admitting: *Deleted

## 2013-04-30 ENCOUNTER — Encounter: Payer: Self-pay | Admitting: *Deleted

## 2013-05-07 ENCOUNTER — Encounter: Payer: Self-pay | Admitting: Cardiology

## 2013-05-07 ENCOUNTER — Ambulatory Visit (INDEPENDENT_AMBULATORY_CARE_PROVIDER_SITE_OTHER): Payer: 59 | Admitting: Cardiology

## 2013-05-07 VITALS — BP 130/84 | HR 84 | Ht 69.0 in | Wt 242.0 lb

## 2013-05-07 DIAGNOSIS — M79605 Pain in left leg: Secondary | ICD-10-CM

## 2013-05-07 DIAGNOSIS — I10 Essential (primary) hypertension: Secondary | ICD-10-CM

## 2013-05-07 DIAGNOSIS — M79609 Pain in unspecified limb: Secondary | ICD-10-CM

## 2013-05-07 DIAGNOSIS — R079 Chest pain, unspecified: Secondary | ICD-10-CM

## 2013-05-07 DIAGNOSIS — E785 Hyperlipidemia, unspecified: Secondary | ICD-10-CM

## 2013-05-07 MED ORDER — FENOFIBRATE 160 MG PO TABS: ORAL_TABLET | ORAL | Status: DC

## 2013-05-07 NOTE — Patient Instructions (Signed)
Your physician recommends that you continue on your current medications as directed. Please refer to the Current Medication list given to you today.  Your physician wants you to follow-up in: 1 year with Dr. [name]. You will receive a reminder letter in the mail two months in advance. If you don't receive a letter, please call our office to schedule the follow-up appointment.

## 2013-05-07 NOTE — Progress Notes (Signed)
Patient ID: [name], male   DOB: [birth year], 48 y.o.   MRN: [id number]    Patient Name: [name] Date of Encounter: 05/07/2013  Primary Care Provider:  Ellsworth Lennox, MD Primary Cardiologist:  [name]  Problem List   Past Medical History  Diagnosis Date  . Kidney stones 2007  . PUD (peptic ulcer disease)     Has had unspecified surgery for this  . Hyperlipidemia   . Elevated cholesterol   . Hypertension     no current bp meds for last 3 months  . Anemia   . Back pain   . Otosclerosis of both ears    Past Surgical History  Procedure Laterality Date  . Surgery for ulcers  1990  . Stapedotomy  2005    lt ear jan, right ear sept  . Nephrectomy  02/18/2011    Procedure: NEPHRECTOMY;  Surgeon: Hanley Ben, MD;  Location: [facility];  Service: Urology;  Laterality: Right;  . Small intestine surgery      Allergies  Allergies  Allergen Reactions  . Beef-Derived Products     Cultural preference  . Pork-Derived Products     Cultural preference    HPI  48 yo from [place].  He was seen by Dr. Carolyne Littles 20 years ago for concern of fatigue and leg pain. His echocardiogram was unremarkable and he's ABIs were normal. His pain was attributed to neuropathic pain and patient is being controlled by pain killers and high doses of gabapentin. Chronic fatigue was thought to be from impaired kidney function but had right nephrectomy and still feels poorly.  Overweight with exertional dyspnea.  No chf or PND/orhtopnea. Has chronic back pain with sciatica secondary to disc protrusion.   His TSH was normal.  The patient is coming 2 years later he still complains of profound fatigue. He also complains of lower retrosternal/epigastric pain that occurs after eating any meal. The patient has a history on gastric ulcer back in 1995 in [place] for which he underwent a gastric surgery. He states that he saw a GI doctor here in [place] in the past but not for last few years. He denies any  recent bloody or black tarry stools or weight loss. He denies exertional chest pain. Sometimes his pain is attribute it to beats inspiration and fatigue. He denies any palpitations or syncope.  The patient is coming after one month's and complains of same symptoms predominantly of profound fatigue, he is considering stopping all of hers medications because he finds them as a culprit for his fatigue.  Home Medications  Prior to Admission medications   Medication Sig Start Date End Date Taking? Authorizing Provider  acetaminophen (TYLENOL) 325 MG tablet Take 325 mg by mouth 2 (two) times daily as needed. For pain    Historical Provider, MD  amLODipine (NORVASC) 5 MG tablet Take 1 tablet (5 mg total) by mouth daily. 10/04/12   Chelle S Jeffery, PA-C  atorvastatin (LIPITOR) 20 MG tablet Take 1 tablet (20 mg total) by mouth daily. 12/28/12   Chelle S Jeffery, PA-C  gabapentin (NEURONTIN) 300 MG capsule Take 300 mg by mouth 3 (three) times daily.    Historical Provider, MD  gemfibrozil (LOPID) 600 MG tablet Take 1 tablet (600 mg total) by mouth 2 (two) times daily before a meal. 10/04/12   Chelle S Jeffery, PA-C  omeprazole (PRILOSEC) 40 MG capsule Take 1 capsule (40 mg total) by mouth daily. 03/12/13   Chelle Janalee Dane, PA-C  tamsulosin (  FLOMAX) 0.4 MG CAPS capsule Take 1 capsule (0.4 mg total) by mouth every evening. 03/14/13   Chelle Janalee Dane, PA-C  traMADol (ULTRAM) 50 MG tablet  09/11/12   Historical Provider, MD  vitamin B-12 (CYANOCOBALAMIN) 1000 MCG tablet Take 1,000 mcg by mouth every evening.     Historical Provider, MD    Family History  Family History  Problem Relation Age of Onset  . Hyperlipidemia Father   . Heart disease Mother   . Hypertension Mother   . Hypertension Sister   . Heart disease Brother 63    CABG  . Hyperlipidemia Sister   . Hyperlipidemia Sister     Social History  History   Social History  . Marital Status: Married    Spouse Name: N/A    Number of  Children: 4  . Years of Education: N/A   Occupational History  .  [employer]   Social History Main Topics  . Smoking status: Never Smoker   . Smokeless tobacco: Never Used  . Alcohol Use: No  . Drug Use: No  . Sexual Activity: Yes   Other Topics Concern  . Not on file   Social History Narrative   Lives at home with wife and family. He is from [place]. Came to the [place] in 2002.     Review of Systems, as per HPI, otherwise negative General:  No chills, fever, night sweats or weight changes.  Cardiovascular:  No chest pain, dyspnea on exertion, edema, orthopnea, palpitations, paroxysmal nocturnal dyspnea. Dermatological: No rash, lesions/masses Respiratory: No cough, dyspnea Urologic: No hematuria, dysuria Abdominal:   No nausea, vomiting, diarrhea, bright red blood per rectum, melena, or hematemesis Neurologic:  No visual changes, wkns, changes in mental status. All other systems reviewed and are otherwise negative except as noted above.  Physical Exam  BP 140/86 mmHg, HR 72 BPM General: Pleasant, NAD Psych: Normal affect. Neuro: Alert and oriented X 3. Moves all extremities spontaneously. HEENT: Normal  Neck: Supple without bruits or JVD. Lungs:  Resp regular and unlabored, CTA. Heart: RRR no s3, s4, or murmurs. Abdomen: Soft, non-tender, non-distended, BS + x 4.  Extremities: No clubbing, cyanosis or edema. DP/PT/Radials 2+ and equal bilaterally.  Labs:  No results found for this basename: CKTOTAL, CKMB, TROPONINI,  in the last 72 hours Lab Results  Component Value Date   WBC 5.0 03/12/2013   HGB 14.5 03/12/2013   HCT 42.0 03/12/2013   MCV 82.2 03/12/2013   PLT 207 03/12/2013   No results found for this basename: NA, K, CL, CO2, BUN, CREATININE, CALCIUM, LABALBU, PROT, BILITOT, ALKPHOS, ALT, AST, GLUCOSE,  in the last 168 hours Lab Results  Component Value Date   CHOL 215* 03/12/2013   HDL 34* 03/12/2013   LDLCALC Comment:   Not calculated due to Triglyceride  >400. Suggest ordering Direct LDL (Unit Code: [phone]).   Total Cholesterol/HDL Ratio:CHD Risk                        Coronary Heart Disease Risk Table                                        Men       Women          1/2 Average Risk              3.4  3.3              Average Risk              5.0        4.4           2X Average Risk              9.6        7.1           3X Average Risk             23.4       11.0 Use the calculated Patient Ratio above and the CHD Risk table  to determine the patient's CHD Risk. ATP III Classification (LDL):       < 100        mg/dL         Optimal      100 - 129     mg/dL         Near or Above Optimal      130 - 159     mg/dL         Borderline High      160 - 189     mg/dL         High       > 190        mg/dL         Very High   03/12/2013   TRIG 574* 03/12/2013    Accessory Clinical Findings  Echocardiogram 12/01/2011 - Left ventricle: The cavity size was normal. Systolic function was normal. The estimated ejection fraction was in the range of 60% to 65%. Wall motion was normal; there were no regional wall motion abnormalities. - Pulmonary arteries: PA peak pressure: 78mm Hg (S).  ECG - on 03/15/2013 normal EKG.  Exercise nuclear stress test: 04/18/2013 Impression  Exercise Capacity: Fairly good exercise capacity.  BP Response: Normal blood pressure response.  Clinical Symptoms: Fatigue  ECG Impression: No significant ST segment change suggestive of ischemia.  Comparison with Prior Nuclear Study: No images to compare  Overall Impression: Normal stress nuclear study. This is a low risk scan. There is no scar or ischemia.  LV Ejection Fraction: 63%. LV Wall Motion: Normal Wall Motion.  Dola Argyle, MD   Assessment & Plan  A 48 year old male  1. Atypical chest pain - this gentleman history of prior gastric ulcers and he is pain being related to food intake, most probably represents a GI problem, however he has multiple risk factors including severely  elevated lipids, obesity, inactivity, impaired glucose tolerance and hypertension. An exercise nuclear stress test to ruled out ischemia and showed fair exercise capacity. He is also explained that he needs to see his GI doctor rather sooner than later.  2. Hypertension - we'll today and normal at stress test.  3. Hyperlipidemia - patient is currently taking Lipitor 20 mg daily, he is severely elevated triglycerides at 574 , added fenofibrate by lipid clinic, he has a followup scheduled with them.   4. Fatigue - gabapentin was changed to 75 mg of Lyrica with no change in his symptoms.  5. LE extremity pain - Normal ABIs in 2013,  Neuropathic in origin. We will start Lyrica   The patient was encouraged not to stop taking his medicines, take appropriate stressed and start exercising that will help with his fatigue as well as would help him to loose weight.   Follow up in  1 year.  [name], MD, [facility] 05/07/2013, 10:29 AM

## 2013-06-17 ENCOUNTER — Other Ambulatory Visit (INDEPENDENT_AMBULATORY_CARE_PROVIDER_SITE_OTHER): Payer: 59

## 2013-06-17 DIAGNOSIS — Z79899 Other long term (current) drug therapy: Secondary | ICD-10-CM

## 2013-06-17 DIAGNOSIS — E785 Hyperlipidemia, unspecified: Secondary | ICD-10-CM

## 2013-06-17 LAB — COMPREHENSIVE METABOLIC PANEL
ALT: 17 U/L (ref 0–53)
AST: 20 U/L (ref 0–37)
Albumin: 3.9 g/dL (ref 3.5–5.2)
Alkaline Phosphatase: 57 U/L (ref 39–117)
BUN: 19 mg/dL (ref 6–23)
CO2: 28 mEq/L (ref 19–32)
Calcium: 9.4 mg/dL (ref 8.4–10.5)
Chloride: 103 mEq/L (ref 96–112)
Creatinine, Ser: 1.3 mg/dL (ref 0.4–1.5)
GFR: 63.09 mL/min (ref >60.00)
Glucose, Bld: 98 mg/dL (ref 70–99)
Potassium: 3.8 mEq/L (ref 3.5–5.1)
Sodium: 140 mEq/L (ref 135–145)
Total Bilirubin: 0.3 mg/dL (ref 0.2–1.2)
Total Protein: 6.4 g/dL (ref 6.0–8.3)

## 2013-06-18 ENCOUNTER — Telehealth: Payer: Self-pay | Admitting: Pharmacist

## 2013-06-18 ENCOUNTER — Ambulatory Visit (INDEPENDENT_AMBULATORY_CARE_PROVIDER_SITE_OTHER): Payer: 59 | Admitting: Pharmacist

## 2013-06-18 ENCOUNTER — Other Ambulatory Visit: Payer: Self-pay | Admitting: Physician Assistant

## 2013-06-18 ENCOUNTER — Other Ambulatory Visit: Payer: Self-pay | Admitting: Cardiology

## 2013-06-18 VITALS — BP 130/80 | Wt 242.0 lb

## 2013-06-18 DIAGNOSIS — Z79899 Other long term (current) drug therapy: Secondary | ICD-10-CM

## 2013-06-18 DIAGNOSIS — E785 Hyperlipidemia, unspecified: Secondary | ICD-10-CM

## 2013-06-18 LAB — LIPID PANEL
Cholesterol: 248 mg/dL — ABNORMAL HIGH (ref 0–200)
HDL: 36.3 mg/dL — ABNORMAL LOW (ref >39.00)
Total CHOL/HDL Ratio: 7
Triglycerides: 1304 mg/dL — ABNORMAL HIGH (ref 0.0–149.0)
VLDL: 260.8 mg/dL — ABNORMAL HIGH (ref 0.0–40.0)

## 2013-06-18 MED ORDER — FENOFIBRATE 160 MG PO TABS: 160.0000 mg | ORAL_TABLET | Freq: Every day | ORAL | Status: DC

## 2013-06-18 NOTE — Assessment & Plan Note (Addendum)
Patient came in for office visit today to discuss his cholesterol.  His TG are up to 1300 mg/dL and after speaking with patient, he tells me that he stopped all of his medications 1 months ago because he always felt tired.  He tells me he feels better now that he has in years.  He was taking lipitor and amlodipine when he was feeling bad.  His BP today was 130/80 and 128/82.  Given his TG are 1300 mg/dL this is the most pressing issue, and will start fenofibrate 160 mg today.  There is a language barrier, so I wrote down instruction and gave it to him verbally as well multiple times.  He will remain off amlodipine for now as BP well controlled off it, and he wants to only take 1 medication at a time to make sure he doesn't get to feeling extremely fatigued again.  Patient is going to be in [place] for 2 months, and leaves in 2 weeks.  He will get repeat lipid / liver in 3 months and see me the following day.  Will consider restarting lipitor or amlodipine at that time if needed.  Patient agreeable to plan.    Sent note to Dr. [name] so she would be aware.

## 2013-06-18 NOTE — Progress Notes (Signed)
Patient is a 48 y.o. Male who moved here from [place] in 2002,  referred to lipid clinic by Dr. [name] given h/o elevated TG and non-HDL.  Patient tells me he stopped all of his medications one month ago due to his ongoing fatigue.  He tells me he now feels better than he has in 8 years.  He mentioned he may do this with Dr. [name] back in 04/2013.  His TG unfortunately are now over 1300 mg/dL since stopping lipitor and fenofibrate.  His TG were 500 mg/dL on lipitor in the past.  Of note, his TG of 1300 mg/dL was not fasting.  I changed his gemfibrozil to fenofibrate in the past as he felt it was making him tired.  Despite being off his amlodipine 10 mg for the past month, his BP is 130/80 and 128/82 on second check.  His brother recently had CABG, and his mother also has h/o CAD.  He denies alcohol use.  He does not eat a lot of desserts or drink sodas.    RF:  HTN, family h/o CAD, low HDL, metabolic syndrome - LDL goal preferably < 100, non-HDL goal < 130 Meds:  Off all medications for past 4 weeks (did this on his own due to feeling fatigued - feels better now, and not interested in restarting all meds back, but will restart one today) Intolerant:  Patient states both gemfibrozil and fish oil make him feel tired soon after he takes them.  Social history:  Denies alcohol use.  Denies tobacco use.  Moved here from [place] 2002. Family history:  Brother - CABG (63 y.o.), Mother- CAD, HTN Exercise:  No regular aerobic activity.  Chronic pain makes this difficult. Diet:  Difficult to get a great sense of his routine diet due to language barrier, however it seems patient eats a lot of chicken and vegetables.  He drinks 1 soda per day at the most, and rarely eats fried foods.  He does eat carbohydrates regularly (bread, pasta, rice, potatoes).    Labs:   05/2013:  TC 248, TG 1304, LDL n/a (elevated TG), HDL 36, glucose 98, LFTs normal, Scr 1.3 (off all medications for past month; labs are also not  fasting) 02/2013:  TC 215, TG 574, LDL n/a (due to elevated TG), HDL 34, CrCl 100 ml/min, Scr 1.15 (On atorvastatin 20 mg qd) 07/2011:  TG 1327 (not on meds)  No current outpatient prescriptions on file.   No current facility-administered medications for this visit.   Allergies  Allergen Reactions  . Beef-Derived Products     Cultural preference  . Pork-Derived Products     Cultural preference   Family History  Problem Relation Age of Onset  . Hyperlipidemia Father   . Heart disease Mother   . Hypertension Mother   . Hypertension Sister   . Heart disease Brother 19    CABG  . Hyperlipidemia Sister   . Hyperlipidemia Sister

## 2013-06-18 NOTE — Telephone Encounter (Signed)
Patient came in for office visit today to discuss his cholesterol.  His TG are up to 1300 mg/dL and after speaking with patient, he tells me that he stopped all of his medications 1 months ago because he always felt tired.  He tells me he feels better now that he has in years.  He was taking lipitor and amlodipine when he was feeling bad.  His BP today was 130/80 and 128/82.  Given his TG are 1300 mg/dL this is the most pressing issue, and will start fenofibrate 160 mg today.  There is a language barrier, so I wrote down instruction and gave it to him verbally as well multiple times.  He will remain off amlodipine for now as BP well controlled off it, and he wants to only take 1 medication at a time to make sure he doesn't get to feeling extremely fatigued again.  Patient is going to be in [place] for 2 months, and leaves in 2 weeks.  He will get repeat lipid / liver in 3 months and see me the following day.  Will consider restarting lipitor or amlodipine at that time if needed.  Dr. [name], please advise patient if you need to see him before he leaves for [place], or if you want to add any other medication.

## 2013-06-18 NOTE — Telephone Encounter (Signed)
[name], I am ok with him following after he returns from [place], agree with Fenofibrate only for now. Thank you, Houston Siren

## 2013-06-18 NOTE — Patient Instructions (Addendum)
1.  Restart one medication - Fenofibrate 160 mg today.  Take this with meal. 2.  Recheck cholesterol in 3 months when back from [place].  Make sure you don't eat anything for 10 hours prior to lab.  Get lab on 09/18/13 (early morning, and don't eat 10 hours prior), and see Ysidro Evert 09/19/13 at 10:00 am in the office.

## 2013-09-16 ENCOUNTER — Other Ambulatory Visit: Payer: 59

## 2013-09-17 ENCOUNTER — Ambulatory Visit: Payer: 59 | Admitting: Pharmacist

## 2013-09-18 ENCOUNTER — Other Ambulatory Visit (INDEPENDENT_AMBULATORY_CARE_PROVIDER_SITE_OTHER): Payer: 59

## 2013-09-18 DIAGNOSIS — E785 Hyperlipidemia, unspecified: Secondary | ICD-10-CM

## 2013-09-18 DIAGNOSIS — Z79899 Other long term (current) drug therapy: Secondary | ICD-10-CM

## 2013-09-18 LAB — HEPATIC FUNCTION PANEL
ALT: 16 U/L (ref 0–53)
AST: 20 U/L (ref 0–37)
Albumin: 4 g/dL (ref 3.5–5.2)
Alkaline Phosphatase: 61 U/L (ref 39–117)
Bilirubin, Direct: 0 mg/dL (ref 0.0–0.3)
Total Bilirubin: 0.3 mg/dL (ref 0.2–1.2)
Total Protein: 6.6 g/dL (ref 6.0–8.3)

## 2013-09-18 LAB — LIPID PANEL
Cholesterol: 257 mg/dL — ABNORMAL HIGH (ref 0–200)
HDL: 25.3 mg/dL — ABNORMAL LOW (ref >39.00)
NonHDL: 231.7
Total CHOL/HDL Ratio: 10
Triglycerides: 1097 mg/dL — ABNORMAL HIGH (ref 0.0–149.0)
VLDL: 219.4 mg/dL — ABNORMAL HIGH (ref 0.0–40.0)

## 2013-09-18 LAB — LDL CHOLESTEROL, DIRECT: Direct LDL: 52.3 mg/dL

## 2013-09-19 ENCOUNTER — Ambulatory Visit (INDEPENDENT_AMBULATORY_CARE_PROVIDER_SITE_OTHER): Payer: 59 | Admitting: Pharmacist

## 2013-09-19 VITALS — BP 130/82 | Wt 243.0 lb

## 2013-09-19 DIAGNOSIS — E785 Hyperlipidemia, unspecified: Secondary | ICD-10-CM

## 2013-09-19 DIAGNOSIS — E782 Mixed hyperlipidemia: Secondary | ICD-10-CM

## 2013-09-19 DIAGNOSIS — Z79899 Other long term (current) drug therapy: Secondary | ICD-10-CM

## 2013-09-19 LAB — BASIC METABOLIC PANEL
BUN: 14 mg/dL (ref 6–23)
CO2: 25 mEq/L (ref 19–32)
Calcium: 9.6 mg/dL (ref 8.4–10.5)
Chloride: 101 mEq/L (ref 96–112)
Creatinine, Ser: 1.3 mg/dL (ref 0.4–1.5)
GFR: 60.84 mL/min (ref >60.00)
Glucose, Bld: 91 mg/dL (ref 70–99)
Potassium: 4.3 mEq/L (ref 3.5–5.1)
Sodium: 135 mEq/L (ref 135–145)

## 2013-09-19 LAB — TSH: TSH: 1.12 u[IU]/mL (ref 0.35–4.50)

## 2013-09-19 MED ORDER — ICOSAPENT ETHYL 1 G PO CAPS: 2.0000 g | ORAL_CAPSULE | Freq: Two times a day (BID) | ORAL | Status: DC

## 2013-09-19 NOTE — Assessment & Plan Note (Signed)
His TG are > 1000 mg/dL despite normal glucose and TSH.  This is likely a genetic issue, therefore needs medication to help increase clearance, and/or reduce production of TG / VLDL.  Patient has failed both fenofibrate and gemfibrozil due to "feeling bad" on them.  He states this also happened with fish oil and with lipitor, but he seems to have a lot of vague complaints of back pain today, and given language barrier, it is difficult to tell if these are adverse effects of drug.  Options for his elevated TG include niacin, fibrate, statin, or a different form of fish oil.  He has failed 2 types of fibrate, and I'm concerned about giving him niacin based on his quick propensity to stop medication at sign of side effect.  Given TG > 1000 will try fish oil again instead of statin given its greater TG reducing properties.  Will use Vascepa instead of over the counter fish oil given it is only EPA and potent.  Hopefully less side effects compared to EPA/DHA forms, or OTC forms of fish oil.  He agrees to call if he has side effects, at which time will likely try some sort of statin again. Plan: 1.  Start Vascepa 2 capsules twice daily - samples given for one week, and prescription sent to pharmacy. 2.  Low fat, low sugar diet encouraged. 3.  Call Ysidro Evert if you develop side effects from Vascepa.  Will consider a different agent at that time. 4.  Recheck cholesterol / liver in 3 months (12/23/13) and see Ysidro Evert 1 day later on 12/24/13 at 9:00 am

## 2013-09-19 NOTE — Progress Notes (Signed)
Patient is a 48 y.o. Male who moved here from [place] in 2002,  referred to lipid clinic by Dr. [name] given h/o elevated TG and non-HDL.  Patient stopped all of his medications 05/2013 to ongoing fatigue which he felt was due to medications.  He states he did feel a little better off meds.  He then saw me in 06/2013 due to TG of 1304 mg/dL, and he agreed to start Fenofibrate 169 mg once daily due to risk of pancreatitis.  He told me he felt better than he has in 8 years when off meds.  He ended up taking fenofibrate for 5-6 days then stopped it as it made him feel fatigued again.  He is very non-specific in the way medications make him feel, and the language barrier makes this even more difficult.  His TG were 500 mg/dL on lipitor in the pas, but this is one of the medications he stopped in past as he felt it made him feel "bad" as well.  His TG were 1100 this week, and were 1300 a few months ago when he was not fasting.  He has been in the [place] for the past 2 months, and tells me his energy level was great while he was there as he didn't have the stress of working, and everything he ate was natural fruit and vegetables.   His brother recently had CABG, and his mother also has h/o CAD.  He denies alcohol use.  He does not eat a lot of desserts or drink sodas.  TSH and BMET checked today to go along with lipid / liver.  RF:  HTN, family h/o CAD, low HDL, metabolic syndrome - LDL goal preferably < 100, non-HDL goal < 130 Meds:  Off all medications.  Stopped fenofibrate 3 months ago, and only took for 1 week (did this on his own due to feeling fatigued - feels better now, and not interested in restarting all meds back, but will restart one today) Intolerant:  Patient states fenofibrate, gemfibrozil, and fish oil make him feel tired soon after he takes them.  Lipitor he also feels caused this feeling.  Social history:  Denies alcohol use.  Denies tobacco use.  Moved here from [place] 2002. Family history:  Brother -  CABG (72 y.o.), Mother- CAD, HTN Exercise:  No regular aerobic activity.  Chronic pain makes this difficult. Diet:  Was in [place] for past 2 months and ate healthier, however now back in [place], and back to his old diet.  Difficult to get a great sense of his routine diet due to language barrier, however it seems patient eats a lot of chicken and vegetables.  He drinks 1 soda per day at the most, and rarely eats fried foods.  He does eat carbohydrates regularly (bread, pasta, rice, potatoes).    Labs:   09/2013:  TC 257, TG 1097, LDL 52, HDL 25, glucose 91, TSH 1.12, LFTs normal, Scr 1.3 (off all medications) 05/2013:  TC 248, TG 1304, LDL n/a (elevated TG), HDL 36, glucose 98, LFTs normal, Scr 1.3 (off all medications for past month; labs are also not fasting) 02/2013:  TC 215, TG 574, LDL n/a (due to elevated TG), HDL 34, CrCl 100 ml/min, Scr 1.15 (On atorvastatin 20 mg qd) 07/2011:  TG 1327 (not on meds)  Current Outpatient Prescriptions  Medication Sig Dispense Refill  . tamsulosin (FLOMAX) 0.4 MG CAPS capsule TAKE 1 CAPSULE BY MOUTH EVERY EVENING  30 capsule  2   No  current facility-administered medications for this visit.   Allergies  Allergen Reactions  . Beef-Derived Products     Cultural preference  . Pork-Derived Products     Cultural preference   Family History  Problem Relation Age of Onset  . Hyperlipidemia Father   . Heart disease Mother   . Hypertension Mother   . Hypertension Sister   . Heart disease Brother 78    CABG  . Hyperlipidemia Sister   . Hyperlipidemia Sister

## 2013-09-19 NOTE — Patient Instructions (Signed)
Plan: 1.  Start Vascepa 2 capsules twice daily - samples given for one week, and prescription sent to pharmacy. 2.  Low fat, low sugar diet encouraged. 3.  Call Ysidro Evert if you develop side effects from Vascepa.  Will consider a different agent at that time. 4.  Recheck cholesterol / liver in 3 months (12/23/13) and see Ysidro Evert 1 day later on 12/24/13 at 9:00 am

## 2013-12-24 ENCOUNTER — Ambulatory Visit: Payer: 59 | Admitting: Pharmacist

## 2013-12-26 ENCOUNTER — Other Ambulatory Visit (INDEPENDENT_AMBULATORY_CARE_PROVIDER_SITE_OTHER): Payer: 59

## 2013-12-26 ENCOUNTER — Ambulatory Visit: Payer: 59 | Admitting: Pharmacist

## 2013-12-26 DIAGNOSIS — Z79899 Other long term (current) drug therapy: Secondary | ICD-10-CM

## 2013-12-26 DIAGNOSIS — E785 Hyperlipidemia, unspecified: Secondary | ICD-10-CM

## 2013-12-26 LAB — LIPID PANEL
Cholesterol: 339 mg/dL — ABNORMAL HIGH (ref 0–200)
HDL: 23.1 mg/dL — ABNORMAL LOW (ref >39.00)
NonHDL: 315.9
Total CHOL/HDL Ratio: 15
Triglycerides: 1260 mg/dL — ABNORMAL HIGH (ref 0.0–149.0)
VLDL: 252 mg/dL — ABNORMAL HIGH (ref 0.0–40.0)

## 2013-12-26 LAB — HEPATIC FUNCTION PANEL
ALT: 21 U/L (ref 0–53)
AST: 17 U/L (ref 0–37)
Albumin: 3.4 g/dL — ABNORMAL LOW (ref 3.5–5.2)
Alkaline Phosphatase: 59 U/L (ref 39–117)
Bilirubin, Direct: 0.1 mg/dL (ref 0.0–0.3)
Total Bilirubin: 0.3 mg/dL (ref 0.2–1.2)
Total Protein: 6.6 g/dL (ref 6.0–8.3)

## 2013-12-26 LAB — LDL CHOLESTEROL, DIRECT: Direct LDL: 74.3 mg/dL

## 2013-12-27 ENCOUNTER — Ambulatory Visit: Payer: 59 | Admitting: Pharmacist

## 2013-12-31 ENCOUNTER — Ambulatory Visit (INDEPENDENT_AMBULATORY_CARE_PROVIDER_SITE_OTHER): Payer: 59 | Admitting: Pharmacist

## 2013-12-31 DIAGNOSIS — E785 Hyperlipidemia, unspecified: Secondary | ICD-10-CM

## 2013-12-31 MED ORDER — NIACIN ER 500 MG PO CPCR: 500.0000 mg | ORAL_CAPSULE | Freq: Every day | ORAL | Status: DC

## 2013-12-31 NOTE — Patient Instructions (Addendum)
Start Niaspan 500mg  once daily.  If you have any problems, please call Gay Filler at [phone].   Continue fish oil   Recheck labs in 3 months.

## 2014-01-01 NOTE — Assessment & Plan Note (Signed)
Pt's TG remain elevated because he will not take any medication for longer than a month.  I had a frank discussion with him today about the risks of elevated TG including pancreatitis.  Unsure if he completely understands risks and importance of taking some type of medication.  Will try Niacin as this is the only other option he has not tried yet.  Unsure if he will take it long term or not.  Would like to try to check TG while on therapy to show him what type of improvement we may see.  Will recheck labs if pt is able to tolerate therapy; if not, no need to recheck labs as well have seen his TG are consistently >1000 on no therapy.

## 2014-01-01 NOTE — Progress Notes (Signed)
Patient is a 48 y.o. Male who moved here from [place] in 2002,  referred to lipid clinic by Dr. [name] given h/o elevated TG and non-HDL.  Patient stopped all of his medications 05/2013 to ongoing fatigue which he felt was due to medications.  He states he did feel a little better off meds.  He said he felt better than he has in 8 years when off meds. He was seen in 06/2013 due to TG of 1304 mg/dL, and he agreed to start Fenofibrate 169 mg once daily due to risk of pancreatitis.   He ended up taking fenofibrate for 5-6 days then stopped it as it made him feel fatigued again.  He is very non-specific in the way medications make him feel, and the language barrier makes this even more difficult.  His TG were 500 mg/dL on lipitor in the past, but this is one of the medications he stopped in past as he felt it made him feel "bad" as well.  His TG were 1100 in August, and were 1300 in May when he was not fasting.  In August he was placed on Vascepa but he stopped this less than a month after starting due to feeling tired.  His brother recently had CABG, and his mother also has h/o CAD.  He denies alcohol use.  He does not eat a lot of desserts or drink sodas.   RF:  HTN, family h/o CAD, low HDL, metabolic syndrome - LDL goal preferably < 100, non-HDL goal < 130 Meds:  Off all medications.  Stopped fenofibrate and Vascepa after only a short period (did this on his own due to feeling fatigued - feels better now) Intolerant:  Patient states fenofibrate, gemfibrozil, and fish oil make him feel tired soon after he takes them.  Lipitor he also feels caused this feeling.  Social history:  Denies alcohol use.  Denies tobacco use.  Moved here from [place] 2002. Family history:  Brother - CABG (40 y.o.), Mother- CAD, HTN Exercise:  No regular aerobic activity. States he doesn't have time to exercise.   Diet:  t.  Difficult to get a great sense of his routine diet due to language barrier, however it seems patient eats a lot of  chicken and vegetables.  He drinks 1 soda per day at the most, and rarely eats fried foods.  He does eat carbohydrates regularly (bread, pasta, rice, potatoes).    Labs:   12/2013: TC 229, TG 1260, LDL 74, HDL 23, non-HDL 216 (off all meds) 09/2013:  TC 257, TG 1097, LDL 52, HDL 25, glucose 91, TSH 1.12, LFTs normal, Scr 1.3 (off all medications) 05/2013:  TC 248, TG 1304, LDL n/a (elevated TG), HDL 36, glucose 98, LFTs normal, Scr 1.3 (off all medications for past month; labs are also not fasting) 02/2013:  TC 215, TG 574, LDL n/a (due to elevated TG), HDL 34, CrCl 100 ml/min, Scr 1.15 (On atorvastatin 20 mg qd) 07/2011:  TG 1327 (not on meds)  Current Outpatient Prescriptions  Medication Sig Dispense Refill  . Icosapent Ethyl (VASCEPA) 1 G CAPS Take 2 g by mouth 2 (two) times daily. 120 capsule 5  . niacin 500 MG CR capsule Take 1 capsule (500 mg total) by mouth at bedtime. 30 capsule 6  . tamsulosin (FLOMAX) 0.4 MG CAPS capsule TAKE 1 CAPSULE BY MOUTH EVERY EVENING 30 capsule 2   No current facility-administered medications for this visit.   Allergies  Allergen Reactions  . Beef-Derived Products  Cultural preference  . Pork-Derived Products     Cultural preference   Family History  Problem Relation Age of Onset  . Hyperlipidemia Father   . Heart disease Mother   . Hypertension Mother   . Hypertension Sister   . Heart disease Brother 93    CABG  . Hyperlipidemia Sister   . Hyperlipidemia Sister

## 2014-02-14 ENCOUNTER — Encounter (HOSPITAL_COMMUNITY): Payer: Self-pay | Admitting: Emergency Medicine

## 2014-02-14 ENCOUNTER — Emergency Department (HOSPITAL_COMMUNITY)
Admission: EM | Admit: 2014-02-14 | Discharge: 2014-02-14 | Disposition: A | Discharge status: Home / Self Care | Payer: 59 | Attending: Emergency Medicine | Admitting: Emergency Medicine

## 2014-02-14 DIAGNOSIS — Z79899 Other long term (current) drug therapy: Secondary | ICD-10-CM | POA: Diagnosis not present

## 2014-02-14 DIAGNOSIS — Z8669 Personal history of other diseases of the nervous system and sense organs: Secondary | ICD-10-CM | POA: Diagnosis not present

## 2014-02-14 DIAGNOSIS — Z862 Personal history of diseases of the blood and blood-forming organs and certain disorders involving the immune mechanism: Secondary | ICD-10-CM | POA: Diagnosis not present

## 2014-02-14 DIAGNOSIS — Z8711 Personal history of peptic ulcer disease: Secondary | ICD-10-CM | POA: Diagnosis not present

## 2014-02-14 DIAGNOSIS — Z7189 Other specified counseling: Secondary | ICD-10-CM

## 2014-02-14 DIAGNOSIS — E780 Pure hypercholesterolemia: Secondary | ICD-10-CM | POA: Insufficient documentation

## 2014-02-14 DIAGNOSIS — I10 Essential (primary) hypertension: Secondary | ICD-10-CM | POA: Insufficient documentation

## 2014-02-14 DIAGNOSIS — Z87442 Personal history of urinary calculi: Secondary | ICD-10-CM | POA: Insufficient documentation

## 2014-02-14 DIAGNOSIS — E785 Hyperlipidemia, unspecified: Secondary | ICD-10-CM | POA: Insufficient documentation

## 2014-02-14 DIAGNOSIS — Z0189 Encounter for other specified special examinations: Secondary | ICD-10-CM | POA: Diagnosis present

## 2014-02-14 NOTE — Discharge Instructions (Signed)
Please follow up with your doctor for injection of your medication as we are not permit to perform it in the ER.

## 2014-02-14 NOTE — ED Notes (Signed)
PA talked to patient. Because we were unable to give pt his testosterone shot using the vial he provided, and the patient states he was under the impression that he would receive the shot when he checked in, the patient wanted his charge removed. Reeves Forth., RN in triage consulted registration, who stated the ER director, Santiago Glad and the Teacher, English as a foreign language, Lovey Newcomer would have to remove the charges. Reeves Forth Emailed the directors to request the charge be waived.

## 2014-02-14 NOTE — ED Provider Notes (Signed)
CSN: [id number]     Arrival date & time 02/14/14  1224 History  This chart was scribed for [name], PA-C, working with Ephraim Hamburger, MD by Starleen Arms, ED Scribe. This patient was seen in room WTR6/WTR6 and the patient's care was started at 12:43 PM.   Chief Complaint  Patient presents with  . Testosterone shot    The history is provided by the patient. No language interpreter was used.   HPI Comments:  [name] is a 49 y.o. male who presents to the Emergency Department needing an IM testosterone injection.  Patient has a vial of 10 mL multidose vial 200 mg/mL Testosterone Cypionateand states he was told to come to ED by Dr. Lowella Bandy, the prescribing physician.  He states he has not received an injection like this before and is supposed to receive a 1 mL injection every 2 weeks.  The office of the prescribing physician is currently closed for holiday.    Past Medical History  Diagnosis Date  . Kidney stones 2007  . PUD (peptic ulcer disease)     Has had unspecified surgery for this  . Hyperlipidemia   . Elevated cholesterol   . Hypertension     no current bp meds for last 3 months  . Anemia   . Back pain   . Otosclerosis of both ears    Past Surgical History  Procedure Laterality Date  . Surgery for ulcers  1990  . Stapedotomy  2005    lt ear jan, right ear sept  . Nephrectomy  02/18/2011    Procedure: NEPHRECTOMY;  Surgeon: Hanley Ben, MD;  Location: [facility];  Service: Urology;  Laterality: Right;  . Small intestine surgery     Family History  Problem Relation Age of Onset  . Hyperlipidemia Father   . Heart disease Mother   . Hypertension Mother   . Hypertension Sister   . Heart disease Brother 27    CABG  . Hyperlipidemia Sister   . Hyperlipidemia Sister    History  Substance Use Topics  . Smoking status: Never Smoker   . Smokeless tobacco: Never Used  . Alcohol Use: No    Review of Systems  Constitutional: Negative for fever.  Respiratory: Negative  for shortness of breath.   Cardiovascular: Negative for chest pain.  Gastrointestinal: Negative for abdominal pain.  Skin: Negative for rash.      Allergies  Beef-derived products and Pork-derived products  Home Medications   Prior to Admission medications   Medication Sig Start Date End Date Taking? Authorizing Provider  Icosapent Ethyl (VASCEPA) 1 G CAPS Take 2 g by mouth 2 (two) times daily. 09/19/13   [name], MD  niacin 500 MG CR capsule Take 1 capsule (500 mg total) by mouth at bedtime. 12/31/13   [name], MD  tamsulosin (FLOMAX) 0.4 MG CAPS capsule TAKE 1 CAPSULE BY MOUTH EVERY EVENING    Chelle S Jeffery, PA-C   BP 158/92 mmHg  Pulse 88  Temp(Src) 97.8 F (36.6 C) (Oral)  Resp 16  SpO2 99% Physical Exam  Constitutional: He is oriented to person, place, and time. He appears well-developed and well-nourished. No distress.  HENT:  Head: Normocephalic and atraumatic.  Eyes: Conjunctivae and EOM are normal.  Neck: Neck supple. No tracheal deviation present.  Cardiovascular: Normal rate.   Pulmonary/Chest: Effort normal. No respiratory distress.  Musculoskeletal: Normal range of motion.  Neurological: He is alert and oriented to person, place, and  time.  Skin: Skin is warm and dry.  Psychiatric: He has a normal mood and affect. His behavior is normal.  Nursing note and vitals reviewed.   ED Course  Procedures (including critical care time)  DIAGNOSTIC STUDIES: Oxygen Saturation is 99% on RA, normal by my interpretation.    COORDINATION OF CARE:  1:04 PM Due to the risk of complication from a medication we do not normally administer in the ER; Informed patient that it would not be possible to receive testosterone injection in the ED today.  Patient acknowledges and agrees with plan.  Care discussed with Dr. Regenia Skeeter.    Labs Review Labs Reviewed - No data to display  Imaging Review No results found.   EKG Interpretation None      MDM    Final diagnoses:  Injection education, encounter for    BP 158/92 mmHg  Pulse 88  Temp(Src) 97.8 F (36.6 C) (Oral)  Resp 16  SpO2 99%   I personally performed the services described in this documentation, which was scribed in my presence. The recorded information has been reviewed and is accurate.     [name], PA-C 02/14/14 1311  [name], PA-C 02/14/14 1311  Ephraim Hamburger, MD 02/14/14 [phone]

## 2014-02-14 NOTE — ED Notes (Signed)
Pt states he is here for a testosterone shot.  Has vial with him.  States his dr's office is closed.

## 2014-02-15 ENCOUNTER — Ambulatory Visit: Payer: 59

## 2014-03-28 ENCOUNTER — Other Ambulatory Visit: Payer: Self-pay | Admitting: Cardiology

## 2014-05-01 ENCOUNTER — Telehealth: Payer: Self-pay | Admitting: Cardiology

## 2014-05-01 DIAGNOSIS — E785 Hyperlipidemia, unspecified: Secondary | ICD-10-CM

## 2014-05-01 DIAGNOSIS — R079 Chest pain, unspecified: Secondary | ICD-10-CM

## 2014-05-01 DIAGNOSIS — I10 Essential (primary) hypertension: Secondary | ICD-10-CM

## 2014-05-01 NOTE — Telephone Encounter (Signed)
Absolutely! Please schedule fasting lipids, CMP, CBC and TSH on any day prior to 05/06/14.

## 2014-05-01 NOTE — Telephone Encounter (Signed)
Pt calling to ask Dr [name] if he can have his labs done prior to his appt with her on 05/06/14 to check his lipids.  Pt states that per his PCP, he is to have this reassessed every 3-4 months.  Pt calling to ask if this could be arranged for him to come to our lab prior to the 3/22 OV.  Informed the pt that this shouldn't be a problem, but I will need to get a verbal order per Dr [name] before proceeding with this. Informed the pt that I will route a message to her for further review and recommendation on requested lab and follow-up thereafter.  Pt verbalized understanding and agrees with this plan.

## 2014-05-01 NOTE — Telephone Encounter (Signed)
New problem   Pt need to speak to nurse concerning having his BP checked before his appt on 3.22.16. Please advise pt.

## 2014-05-02 NOTE — Telephone Encounter (Signed)
Second attempt to call patient, left message to call back.

## 2014-05-02 NOTE — Telephone Encounter (Signed)
Left message to call back  

## 2014-05-05 NOTE — Telephone Encounter (Signed)
Notified patient that he can come in this week prior to his appointment on Friday for blood work/labs. He states he will come in tomorrow, Tuesday, March 22nd at 10 am for fasting labs. He stated that he knows he needs to be fasting.

## 2014-05-06 ENCOUNTER — Other Ambulatory Visit (INDEPENDENT_AMBULATORY_CARE_PROVIDER_SITE_OTHER): Payer: Self-pay

## 2014-05-06 ENCOUNTER — Ambulatory Visit: Payer: Self-pay | Admitting: Cardiology

## 2014-05-06 DIAGNOSIS — E785 Hyperlipidemia, unspecified: Secondary | ICD-10-CM

## 2014-05-06 DIAGNOSIS — I10 Essential (primary) hypertension: Secondary | ICD-10-CM

## 2014-05-06 DIAGNOSIS — R079 Chest pain, unspecified: Secondary | ICD-10-CM

## 2014-05-06 LAB — COMPREHENSIVE METABOLIC PANEL
ALT: 16 U/L (ref 0–53)
AST: 18 U/L (ref 0–37)
Albumin: 4.2 g/dL (ref 3.5–5.2)
Alkaline Phosphatase: 37 U/L — ABNORMAL LOW (ref 39–117)
BUN: 19 mg/dL (ref 6–23)
CO2: 27 mEq/L (ref 19–32)
Calcium: 9.4 mg/dL (ref 8.4–10.5)
Chloride: 104 mEq/L (ref 96–112)
Creatinine, Ser: 1.47 mg/dL (ref 0.40–1.50)
GFR: 54.07 mL/min — ABNORMAL LOW (ref >60.00)
Glucose, Bld: 88 mg/dL (ref 70–99)
Potassium: 4.2 mEq/L (ref 3.5–5.1)
Sodium: 137 mEq/L (ref 135–145)
Total Bilirubin: 0.6 mg/dL (ref 0.2–1.2)
Total Protein: 6.8 g/dL (ref 6.0–8.3)

## 2014-05-06 LAB — LIPID PANEL
Cholesterol: 255 mg/dL — ABNORMAL HIGH (ref 0–200)
HDL: 39.9 mg/dL (ref >39.00)
NonHDL: 215.1
Total CHOL/HDL Ratio: 6
Triglycerides: 283 mg/dL — ABNORMAL HIGH (ref 0.0–149.0)
VLDL: 56.6 mg/dL — ABNORMAL HIGH (ref 0.0–40.0)

## 2014-05-06 LAB — TSH: TSH: 0.56 u[IU]/mL (ref 0.35–4.50)

## 2014-05-06 LAB — CBC
HCT: 45.1 % (ref 39.0–52.0)
Hemoglobin: 15.3 g/dL (ref 13.0–17.0)
MCHC: 34 g/dL (ref 30.0–36.0)
MCV: 83.5 fl (ref 78.0–100.0)
Platelets: 206 10*3/uL (ref 150.0–400.0)
RBC: 5.4 Mil/uL (ref 4.22–5.81)
RDW: 14.1 % (ref 11.5–15.5)
WBC: 6.7 10*3/uL (ref 4.0–10.5)

## 2014-05-06 LAB — LDL CHOLESTEROL, DIRECT: Direct LDL: 139 mg/dL

## 2014-05-09 ENCOUNTER — Encounter: Payer: Self-pay | Admitting: Cardiology

## 2014-05-09 ENCOUNTER — Ambulatory Visit (INDEPENDENT_AMBULATORY_CARE_PROVIDER_SITE_OTHER): Payer: 59 | Admitting: Cardiology

## 2014-05-09 VITALS — BP 130/78 | HR 98 | Ht 69.0 in | Wt 247.2 lb

## 2014-05-09 DIAGNOSIS — R072 Precordial pain: Secondary | ICD-10-CM | POA: Diagnosis not present

## 2014-05-09 DIAGNOSIS — I10 Essential (primary) hypertension: Secondary | ICD-10-CM | POA: Diagnosis not present

## 2014-05-09 DIAGNOSIS — E785 Hyperlipidemia, unspecified: Secondary | ICD-10-CM | POA: Diagnosis not present

## 2014-05-09 DIAGNOSIS — E781 Pure hyperglyceridemia: Secondary | ICD-10-CM | POA: Diagnosis not present

## 2014-05-09 DIAGNOSIS — J309 Allergic rhinitis, unspecified: Secondary | ICD-10-CM

## 2014-05-09 MED ORDER — AMLODIPINE BESYLATE 5 MG PO TABS: 5.0000 mg | ORAL_TABLET | Freq: Every day | ORAL | Status: DC

## 2014-05-09 MED ORDER — FEXOFENADINE HCL 180 MG PO TABS: 180.0000 mg | ORAL_TABLET | Freq: Every day | ORAL | Status: DC

## 2014-05-09 MED ORDER — FENOFIBRATE 160 MG PO TABS: 160.0000 mg | ORAL_TABLET | Freq: Every day | ORAL | Status: DC

## 2014-05-09 NOTE — Progress Notes (Signed)
Patient ID: [name], male   DOB: [birth year], 49 y.o.   MRN: [id number]    Patient Name: [name] Date of Encounter: 05/09/2014  Primary Care Provider:  Ellsworth Lennox, MD Primary Cardiologist:  [name]  Problem List   Past Medical History  Diagnosis Date  . Kidney stones 2007  . PUD (peptic ulcer disease)     Has had unspecified surgery for this  . Hyperlipidemia   . Elevated cholesterol   . Hypertension     no current bp meds for last 3 months  . Anemia   . Back pain   . Otosclerosis of both ears    Past Surgical History  Procedure Laterality Date  . Surgery for ulcers  1990  . Stapedotomy  2005    lt ear jan, right ear sept  . Nephrectomy  02/18/2011    Procedure: NEPHRECTOMY;  Surgeon: Andrew Ben, MD;  Location: [facility];  Service: Urology;  Laterality: Right;  . Small intestine surgery      Allergies  Allergies  Allergen Reactions  . Beef-Derived Products     Cultural preference  . Pork-Derived Products     Cultural preference    HPI  49 yo from [place].  He was seen by Dr. Carolyne Barnett 20 years ago for concern of fatigue and leg pain. His echocardiogram was unremarkable and he's ABIs were normal. His pain was attributed to neuropathic pain and patient is being controlled by pain killers and high doses of gabapentin. Chronic fatigue was thought to be from impaired kidney function but had right nephrectomy and still feels poorly.  Overweight with exertional dyspnea.  No chf or PND/orhtopnea. Has chronic back pain with sciatica secondary to disc protrusion.   His TSH was normal.  The patient is coming 2 years later he still complains of profound fatigue. He also complains of lower retrosternal/epigastric pain that occurs after eating any meal. The patient has a history on gastric ulcer back in 1995 in [place] for which he underwent a gastric surgery. He states that he saw a GI doctor here in [place] in the past but not for last few years. He denies any  recent bloody or black tarry stools or weight loss. He denies exertional chest pain. Sometimes his pain is attribute it to beats inspiration and fatigue. He denies any palpitations or syncope.  05/09/2014 - the patient is coming after 4 months, he is complaint with his meds and has no side effects from statins, no muscle pain. NO chest pain or SOB. His major limitation is lower back pain.  Denies LE edema, orthopnea or PND.   Home Medications  Prior to Admission medications   Medication Sig Start Date End Date Taking? Authorizing Provider  acetaminophen (TYLENOL) 325 MG tablet Take 325 mg by mouth 2 (two) times daily as needed. For pain    Historical Provider, MD  amLODipine (NORVASC) 5 MG tablet Take 1 tablet (5 mg total) by mouth daily. 10/04/12   Andrew S Jeffery, PA-C  atorvastatin (LIPITOR) 20 MG tablet Take 1 tablet (20 mg total) by mouth daily. 12/28/12   Andrew S Jeffery, PA-C  gabapentin (NEURONTIN) 300 MG capsule Take 300 mg by mouth 3 (three) times daily.    Historical Provider, MD  gemfibrozil (LOPID) 600 MG tablet Take 1 tablet (600 mg total) by mouth 2 (two) times daily before a meal. 10/04/12   Andrew S Jeffery, PA-C  omeprazole (PRILOSEC) 40 MG capsule Take 1 capsule (40 mg total) by  mouth daily. 03/12/13   Andrew Janalee Dane, PA-C  tamsulosin (FLOMAX) 0.4 MG CAPS capsule Take 1 capsule (0.4 mg total) by mouth every evening. 03/14/13   Andrew Janalee Dane, PA-C  traMADol (ULTRAM) 50 MG tablet  09/11/12   Historical Provider, MD  vitamin B-12 (CYANOCOBALAMIN) 1000 MCG tablet Take 1,000 mcg by mouth every evening.     Historical Provider, MD    Family History  Family History  Problem Relation Age of Onset  . Hyperlipidemia Father   . Heart disease Mother   . Hypertension Mother   . Hypertension Sister   . Heart disease Brother 43    CABG  . Hyperlipidemia Sister   . Hyperlipidemia Sister     Social History  History   Social History  . Marital Status: Married    Spouse Name:  N/A  . Number of Children: 4  . Years of Education: N/A   Occupational History  .  [employer]   Social History Main Topics  . Smoking status: Never Smoker   . Smokeless tobacco: Never Used  . Alcohol Use: No  . Drug Use: No  . Sexual Activity: Yes   Other Topics Concern  . Not on file   Social History Narrative   Lives at home with wife and family. He is from [place]. Came to the [place] in 2002.     Review of Systems, as per HPI, otherwise negative General:  No chills, fever, night sweats or weight changes.  Cardiovascular:  No chest pain, dyspnea on exertion, edema, orthopnea, palpitations, paroxysmal nocturnal dyspnea. Dermatological: No rash, lesions/masses Respiratory: No cough, dyspnea Urologic: No hematuria, dysuria Abdominal:   No nausea, vomiting, diarrhea, bright red blood per rectum, melena, or hematemesis Neurologic:  No visual changes, wkns, changes in mental status. All other systems reviewed and are otherwise negative except as noted above.  Physical Exam  BP 140/86 mmHg, HR 72 BPM General: Pleasant, NAD Psych: Normal affect. Neuro: Alert and oriented X 3. Moves all extremities spontaneously. HEENT: Normal  Neck: Supple without bruits or JVD. Lungs:  Resp regular and unlabored, CTA. Heart: RRR no s3, s4, or murmurs. Abdomen: Soft, non-tender, non-distended, BS + x 4.  Extremities: No clubbing, cyanosis or edema. DP/PT/Radials 2+ and equal bilaterally.  Labs:  No results for input(s): CKTOTAL, CKMB, TROPONINI in the last 72 hours. Lab Results  Component Value Date   WBC 6.7 05/06/2014   HGB 15.3 05/06/2014   HCT 45.1 05/06/2014   MCV 83.5 05/06/2014   PLT 206.0 05/06/2014     Recent Labs Lab 05/06/14 1209  NA 137  K 4.2  CL 104  CO2 27  BUN 19  CREATININE 1.47  CALCIUM 9.4  PROT 6.8  BILITOT 0.6  ALKPHOS 37*  ALT 16  AST 18  GLUCOSE 88   Lab Results  Component Value Date   CHOL 255* 05/06/2014   HDL 39.90 05/06/2014   LDLCALC   03/12/2013     Comment:       Not calculated due to Triglyceride >400. Suggest ordering Direct LDL (Unit Code: [id number]).   Total Cholesterol/HDL Ratio:CHD Risk                        Coronary Heart Disease Risk Table  Men       Women          1/2 Average Risk              3.4        3.3              Average Risk              5.0        4.4           2X Average Risk              9.6        7.1           3X Average Risk             23.4       11.0 Use the calculated Patient Ratio above and the CHD Risk table  to determine the patient's CHD Risk. ATP III Classification (LDL):       < 100        mg/dL         Optimal      100 - 129     mg/dL         Near or Above Optimal      130 - 159     mg/dL         Borderline High      160 - 189     mg/dL         High       > 190        mg/dL         Very High     TRIG 283.0* 05/06/2014    Accessory Clinical Findings  Echocardiogram 12/01/2011 - Left ventricle: The cavity size was normal. Systolic function was normal. The estimated ejection fraction was in the range of 60% to 65%. Wall motion was normal; there were no regional wall motion abnormalities. - Pulmonary arteries: PA peak pressure: 66mm Hg (S).  ECG - on 03/15/2013 normal EKG.  Exercise nuclear stress test: 04/18/2013 Impression  Exercise Capacity: Fairly good exercise capacity.  BP Response: Normal blood pressure response.  Clinical Symptoms: Fatigue  ECG Impression: No significant ST segment change suggestive of ischemia.  Comparison with Prior Nuclear Study: No images to compare  Overall Impression: Normal stress nuclear study. This is a low risk scan. There is no scar or ischemia.  LV Ejection Fraction: 63%. LV Wall Motion: Normal Wall Motion.  Dola Argyle, MD   Assessment & Plan  A 49 year old male  1. Atypical chest pain - this gentleman history of prior gastric ulcers and he is pain being related to food intake, most probably  represents a GI problem, however he has multiple risk factors including severely elevated lipids, obesity, inactivity, impaired glucose tolerance and hypertension. An exercise nuclear stress test to ruled out ischemia and showed fair exercise capacity. He is also explained that he needs to see his GI doctor rather sooner than later.  2. Hypertension - normal today and normal at stress test.  3. Hyperlipidemia - combined elevated TG and LDL, on Lipitor 20 mg daily and fenofibrate 160 mg po daily, followed by lipid clinic, excellent response to therapy with TG 1260 --> 74, LDL 139, we will continue the same regimen.   4. Fatigue - gabapentin was changed to 75 mg of Lyrica with no change in his symptoms.  5. LE extremity pain - Normal  ABIs in 2013,  Neuropathic in origin. We will start Lyrica   6. Allergies - we will start allegra 180 mg po daily.  The patient was encouraged not to stop taking his medicines, take appropriate stressed and start exercising that will help with his fatigue as well as would help him to loose weight.   Follow up in 1 year.  Andrew Spark, MD, [facility] 05/09/2014, 11:29 AM

## 2014-05-09 NOTE — Patient Instructions (Signed)
Your physician has recommended you make the following change in your medication:    START TAKING ALLEGRA Andrew [name] ---YOU CAN PURCHASE THIS OTC    Your physician wants you to follow-up in: [name] will receive a reminder letter in the mail two months in advance. If you don't receive a letter, please call our office to schedule the follow-up appointment.      Marland Kitchen

## 2014-09-18 ENCOUNTER — Ambulatory Visit (INDEPENDENT_AMBULATORY_CARE_PROVIDER_SITE_OTHER): Payer: 59 | Admitting: Physician Assistant

## 2014-09-18 VITALS — BP 124/80 | HR 80 | Temp 98.2°F | Resp 16 | Ht 70.0 in | Wt 238.8 lb

## 2014-09-18 DIAGNOSIS — Z130 Encounter for screening for diseases of the blood and blood-forming organs and certain disorders involving the immune mechanism: Secondary | ICD-10-CM | POA: Diagnosis not present

## 2014-09-18 DIAGNOSIS — Z131 Encounter for screening for diabetes mellitus: Secondary | ICD-10-CM

## 2014-09-18 DIAGNOSIS — Z13220 Encounter for screening for lipoid disorders: Secondary | ICD-10-CM | POA: Diagnosis not present

## 2014-09-18 DIAGNOSIS — Z1329 Encounter for screening for other suspected endocrine disorder: Secondary | ICD-10-CM | POA: Diagnosis not present

## 2014-09-18 DIAGNOSIS — R5383 Other fatigue: Secondary | ICD-10-CM | POA: Diagnosis not present

## 2014-09-18 DIAGNOSIS — Z13228 Encounter for screening for other metabolic disorders: Secondary | ICD-10-CM | POA: Diagnosis not present

## 2014-09-18 DIAGNOSIS — Z0000 Encounter for general adult medical examination without abnormal findings: Secondary | ICD-10-CM

## 2014-09-18 LAB — COMPLETE METABOLIC PANEL WITH GFR
ALT: 21 U/L (ref 9–46)
AST: 21 U/L (ref 10–40)
Albumin: 4.2 g/dL (ref 3.6–5.1)
Alkaline Phosphatase: 38 U/L — ABNORMAL LOW (ref 40–115)
BUN: 19 mg/dL (ref 7–25)
CHLORIDE: 104 mmol/L (ref 98–110)
CO2: 25 mmol/L (ref 20–31)
Calcium: 9.3 mg/dL (ref 8.6–10.3)
Creat: 1.39 mg/dL — ABNORMAL HIGH (ref 0.60–1.35)
GFR, EST NON AFRICAN AMERICAN: 59 mL/min — AB (ref >=60)
GFR, Est African American: 68 mL/min (ref >=60)
Glucose, Bld: 80 mg/dL (ref 65–99)
Potassium: 4.9 mmol/L (ref 3.5–5.3)
Sodium: 140 mmol/L (ref 135–146)
Total Bilirubin: 0.5 mg/dL (ref 0.2–1.2)
Total Protein: 6.8 g/dL (ref 6.1–8.1)

## 2014-09-18 LAB — TSH: TSH: 1.008 u[IU]/mL (ref 0.350–4.500)

## 2014-09-18 LAB — POCT CBC
Granulocyte percent: 65.4 %G (ref 37–80)
HCT, POC: 48.9 % (ref 43.5–53.7)
Hemoglobin: 16.3 g/dL (ref 14.1–18.1)
Lymph, poc: 2.2 (ref 0.6–3.4)
MCH: 27.9 pg (ref 27–31.2)
MCHC: 33.3 g/dL (ref 31.8–35.4)
MCV: 83.8 fL (ref 80–97)
MID (cbc): 0.4 (ref 0–0.9)
MPV: 9.8 fL (ref 0–99.8)
POC GRANULOCYTE: 4.8 (ref 2–6.9)
POC LYMPH %: 29.5 % (ref 10–50)
POC MID %: 5.1 %M (ref 0–12)
Platelet Count, POC: 232 10*3/uL (ref 142–424)
RBC: 5.83 M/uL (ref 4.69–6.13)
RDW, POC: 14.1 %
WBC: 7.4 10*3/uL (ref 4.6–10.2)

## 2014-09-18 LAB — LIPID PANEL
Cholesterol: 244 mg/dL — ABNORMAL HIGH (ref 125–200)
HDL: 37 mg/dL — ABNORMAL LOW (ref >=40)
Total CHOL/HDL Ratio: 6.6 Ratio — ABNORMAL HIGH (ref <=5.0)
Triglycerides: 432 mg/dL — ABNORMAL HIGH (ref <150)

## 2014-09-18 LAB — GLUCOSE, POCT (MANUAL RESULT ENTRY): POC Glucose: 69 mg/dl — AB (ref 70–99)

## 2014-09-18 LAB — POCT GLYCOSYLATED HEMOGLOBIN (HGB A1C): HEMOGLOBIN A1C: 5.3

## 2014-09-18 NOTE — Patient Instructions (Signed)
I will have your lab results within the next 10-14 days.  Your a1c is normal so diabetes is not playing a part in your fatigue after meals.  I do want you to attempt to put insoles in your footwear to add cushion and absorb the shock with stepping.  You need to increase your exercise to 4 times per week for 30 minutes of aerobic (heart pumping) activity.  This will help with your pains.   Please follow up with your orhopedist about this foot pain.  If you need a referral, let me know.    Keeping you healthy  Get these tests  Blood pressure- Have your blood pressure checked once a year by your healthcare provider.  Normal blood pressure is 120/80.  Weight- Have your body mass index (BMI) calculated to screen for obesity.  BMI is a measure of body fat based on height and weight. You can also calculate your own BMI at [employer]velB[employer].it.  Cholesterol- Have your cholesterol checked regularly starting at age 22, sooner may be necessary if you have diabetes, high blood pressure, if a family member developed heart diseases at an early age or if you smoke.   Chlamydia, HIV, and other sexual transmitted disease- Get screened each year until the age of 42 then within three months of each new sexual partner.  Diabetes- Have your blood sugar checked regularly if you have high blood pressure, high cholesterol, a family history of diabetes or if you are overweight.  Get these vaccines  Flu shot- Every fall.  Tetanus shot- Every 10 years.  Menactra- Single dose; prevents meningitis.  Take these steps  Don't smoke- If you do smoke, ask your healthcare provider about quitting. For tips on how to quit, go to www.smokefree.gov or call 1-800-QUIT-NOW.  Be physically active- Exercise 5 days a week for at least 30 minutes.  If you are not already physically active start slow and gradually work up to 30 minutes of moderate physical activity.  Examples of moderate activity include walking briskly, mowing  the yard, dancing, swimming bicycling, etc.  Eat a healthy diet- Eat a variety of healthy foods such as fruits, vegetables, low fat milk, low fat cheese, yogurt, lean meats, poultry, fish, beans, tofu, etc.  For more information on healthy eating, go to www.thenutritionsource.org  Drink alcohol in moderation- Limit alcohol intake two drinks or less a day.  Never drink and drive.  Dentist- Brush and floss teeth twice daily; visit your dentis twice a year.  Depression-Your emotional health is as important as your physical health.  If you're feeling down, losing interest in things you normally enjoy please talk with your healthcare provider.  Gun Safety- If you keep a gun in your home, keep it unloaded and with the safety lock on.  Bullets should be stored separately.  Helmet use- Always wear a helmet when riding a motorcycle, bicycle, rollerblading or skateboarding.  Safe sex- If you may be exposed to a sexually transmitted infection, use a condom  Seat belts- Seat bels can save your life; always wear one.  Smoke/Carbon Monoxide detectors- These detectors need to be installed on the appropriate level of your home.  Replace batteries at least once a year.  Skin Cancer- When out in the sun, cover up and use sunscreen SPF 15 or higher.  Violence- If anyone is threatening or hurting you, please tell your healthcare provider.

## 2014-09-18 NOTE — Progress Notes (Signed)
[facility] [address] [place] [address] [phone]  Date:  09/18/2014   Name:  [name]   DOB:  03/19/1965   MRN:  [id number]  PCP:  Ellsworth Lennox, MD    History of Present Illness:  [name] is a 49 y.o. male patient who presents to [facility] for annual physical exam and to have form completed.    Diet: Vegetables, beef, avoids bread and sugars as this makes him very fatigued.  He has no abdominal pain, or bloating.  He feels incredibly sleepy.  No nausea, vomiting, or vision changes.  No BM changes.    BM: No constipation, diarrhea, or blood in stool  Urination: No hematuria, dysuria, or frequency.  Also complains of low back pain.  He is followed by a neurosurgeon.  He was given an injection which has helped.    Patient also states that his left foot has pain intermittently with standing and walking at work.  It is at his inner ankle.  There is some swelling at times.  He will generally lay off his foot until foot pain resolves.   He also has neck pain and stiffness at times.  He does not engage in any exercise.     His complaint is that his food intake makes him feel incredibly tired.  When asked if it is specific foods, he does state breads and sugars such as desserts or candies.  He states that he just wants to sleep.   Looking at the notes, he complains of this after taking his fenofibrate, fish oil, and gemifibrozil (01/01/2014).   Patient Active Problem List   Diagnosis Date Noted  . Chest pain 03/29/2013  . Hyperlipidemia 03/29/2013  . Nephrolithiasis 03/14/2013  . Acute blood loss anemia 03/14/2013  . Lumbar radiculopathy 02/26/2013  . Dyspnea 11/25/2011  . Leg pain, left 11/25/2011  . Vitamin D deficiency 08/03/2011  . Constipation 04/26/2011  . HTN (hypertension) 04/10/2010  . HLD (hyperlipidemia) 04/10/2010    Past Medical History  Diagnosis Date  . Kidney stones 2007  . PUD (peptic ulcer disease)     Has had unspecified  surgery for this  . Hyperlipidemia   . Elevated cholesterol   . Hypertension     no current bp meds for last 3 months  . Anemia   . Back pain   . Otosclerosis of both ears     Past Surgical History  Procedure Laterality Date  . Surgery for ulcers  1990  . Stapedotomy  2005    lt ear jan, right ear sept  . Nephrectomy  02/18/2011    Procedure: NEPHRECTOMY;  Surgeon: Hanley Ben, MD;  Location: [facility];  Service: Urology;  Laterality: Right;  . Small intestine surgery      History  Substance Use Topics  . Smoking status: Never Smoker   . Smokeless tobacco: Never Used  . Alcohol Use: No    Family History  Problem Relation Age of Onset  . Hyperlipidemia Father   . Heart disease Mother   . Hypertension Mother   . Hypertension Sister   . Heart disease Brother 33    CABG  . Hyperlipidemia Sister   . Hyperlipidemia Sister     Allergies  Allergen Reactions  . Beef-Derived Products     Cultural preference  . Pork-Derived Products     Cultural preference    Medication list has been reviewed and updated.  Current Outpatient Prescriptions on File Prior to Visit  Medication Sig Dispense Refill  . amLODipine (NORVASC) 5 MG tablet Take 1 tablet (5 mg total) by mouth daily. 90 tablet 4  . fenofibrate 160 MG tablet Take 1 tablet (160 mg total) by mouth daily. With largest meal 90 tablet 6  . testosterone cypionate (DEPOTESTOTERONE CYPIONATE) 200 MG/ML injection   0  . fexofenadine (ALLEGRA ALLERGY) 180 MG tablet Take 1 tablet (180 mg total) by mouth daily. (Patient not taking: Reported on 09/18/2014) 90 tablet 0   No current facility-administered medications on file prior to visit.    Review of Systems  Constitutional: Negative for fever and chills.  Eyes: Negative for blurred vision.  Respiratory: Negative for cough, shortness of breath and wheezing.   Cardiovascular: Negative for chest pain and palpitations.  Gastrointestinal: Negative for heartburn, nausea, vomiting,  abdominal pain, diarrhea, constipation and blood in stool.  Genitourinary: Negative for dysuria, urgency, frequency and hematuria.  Musculoskeletal: Positive for joint pain.  Neurological: Negative for dizziness, tingling and weakness.     Physical Examination: BP 124/80 mmHg  Pulse 80  Temp(Src) 98.2 F (36.8 C) (Oral)  Resp 16  Ht 5\' 10"  (1.778 m)  Wt 238 lb 12.8 oz (108.319 kg)  BMI 34.26 kg/m2  SpO2 98% Ideal Body Weight: Weight in (lb) to have BMI = 25: 173.9  Physical Exam  Constitutional: He is oriented to person, place, and time. He appears well-developed and well-nourished. No distress.  Eyes: EOM are normal. Pupils are equal, round, and reactive to light.  Cardiovascular: Normal rate, regular rhythm, normal heart sounds and intact distal pulses.  Exam reveals no friction rub.   No murmur heard. Pulmonary/Chest: Effort normal and breath sounds normal. No respiratory distress. He has no wheezes.  Abdominal: Soft. Bowel sounds are normal. There is no hepatosplenomegaly. There is no tenderness. There is negative Murphy's sign.    Abdominal scars.  Ventral hernia, without tenderness.    Musculoskeletal: Normal range of motion. He exhibits no edema or tenderness.  Neurological: He is alert and oriented to person, place, and time. He has normal reflexes. No cranial nerve deficit. Coordination normal.  Skin: Skin is warm and dry. No rash noted. He is not diaphoretic. No erythema.  Psychiatric: He has a normal mood and affect. His behavior is normal.     Assessment and Plan: 49 year old male is here today with PMH listed above is here today for an annual physical exam and chief complaint of fatigue after meals.  Screening for diabetes at this time.  I have advised htat he also keep a journal of what he eats.  However patient claims he is fatigued by all foods.  Low back pain is followed by neurosurgery.  He states that they are following his foot pain as well.  Hx of high  triglycerides.  Lipid panel will reveal triglycerides at this time.  He is fasting.   - -Declines prostate exam at this time.     1. Annual physical exam - COMPLETE METABOLIC PANEL WITH GFR - POCT CBC - POCT glucose (manual entry) - POCT glycosylated hemoglobin (Hb A1C) - TSH - Lipid panel  2. Screening for lipid disorders - Lipid panel  3. Screening for thyroid disorder - TSH  4. Screening for diabetes mellitus - POCT glucose (manual entry) - POCT glycosylated hemoglobin (Hb A1C)  5. Screening for deficiency anemia - POCT CBC  6. Screening for metabolic disorder - COMPLETE METABOLIC PANEL WITH GFR   Ivar Drape, PA-C [facility] 09/18/2014 10:08 PM

## 2014-09-23 ENCOUNTER — Other Ambulatory Visit: Payer: Self-pay | Admitting: Physician Assistant

## 2014-09-23 DIAGNOSIS — E782 Mixed hyperlipidemia: Secondary | ICD-10-CM

## 2014-09-23 DIAGNOSIS — E781 Pure hyperglyceridemia: Secondary | ICD-10-CM

## 2014-09-23 NOTE — Progress Notes (Signed)
Contacted patient regarding lipid panel.  Triglycerides continue to stay elevated.  This was fasting, and he reports that he is compliant with fenofibrate.  Lipid specialist consult would be appreciated at this time.  Considerable adverse rxn toward many of the lipid lowering therapies.  Counseled on food modifications and weight loss.

## 2014-10-09 ENCOUNTER — Ambulatory Visit: Payer: 59 | Admitting: Pharmacist Clinician (PhC)/ Clinical Pharmacy Specialist

## 2015-04-08 ENCOUNTER — Other Ambulatory Visit: Payer: Self-pay | Admitting: Nephrology

## 2015-04-08 DIAGNOSIS — N183 Chronic kidney disease, stage 3 unspecified: Secondary | ICD-10-CM

## 2015-04-13 ENCOUNTER — Ambulatory Visit
Admission: RE | Admit: 2015-04-13 | Discharge: 2015-04-13 | Disposition: A | Discharge status: Home / Self Care | Payer: 59 | Source: Ambulatory Visit | Attending: Nephrology | Admitting: Nephrology

## 2015-04-13 DIAGNOSIS — N183 Chronic kidney disease, stage 3 unspecified: Secondary | ICD-10-CM

## 2015-05-14 ENCOUNTER — Encounter: Payer: Self-pay | Admitting: Internal Medicine

## 2015-06-02 ENCOUNTER — Other Ambulatory Visit: Payer: Self-pay | Admitting: Cardiology

## 2015-06-11 ENCOUNTER — Other Ambulatory Visit: Payer: Self-pay | Admitting: Cardiology

## 2015-06-11 NOTE — Telephone Encounter (Signed)
New message    *STAT* If patient is at the pharmacy, call can be transferred to refill team.   1. Which medications need to be refilled? (please list name of each medication and dose if known)  fenofibrate 160 MG tablet amLODipine (NORVASC) 5 MG tablet   2. Which pharmacy/location (including street and city if local pharmacy) is medication to be sent to? [place] [facility]   3. Do they need a 30 day or 90 day supply? 30 day

## 2015-07-03 DIAGNOSIS — H9071 Mixed conductive and sensorineural hearing loss, unilateral, right ear, with unrestricted hearing on the contralateral side: Secondary | ICD-10-CM | POA: Insufficient documentation

## 2015-07-03 DIAGNOSIS — H9193 Unspecified hearing loss, bilateral: Secondary | ICD-10-CM | POA: Insufficient documentation

## 2015-07-06 ENCOUNTER — Ambulatory Visit: Payer: 59 | Admitting: Cardiology

## 2015-07-08 ENCOUNTER — Encounter: Payer: Self-pay | Admitting: Internal Medicine

## 2015-07-08 ENCOUNTER — Ambulatory Visit (INDEPENDENT_AMBULATORY_CARE_PROVIDER_SITE_OTHER): Payer: 59 | Admitting: Internal Medicine

## 2015-07-08 VITALS — BP 168/92 | HR 104 | Ht 69.0 in | Wt 254.0 lb

## 2015-07-08 DIAGNOSIS — Z8711 Personal history of peptic ulcer disease: Secondary | ICD-10-CM

## 2015-07-08 DIAGNOSIS — Z8619 Personal history of other infectious and parasitic diseases: Secondary | ICD-10-CM | POA: Diagnosis not present

## 2015-07-08 DIAGNOSIS — Z86010 Personal history of colonic polyps: Secondary | ICD-10-CM | POA: Diagnosis not present

## 2015-07-08 DIAGNOSIS — R140 Abdominal distension (gaseous): Secondary | ICD-10-CM

## 2015-07-08 MED ORDER — NA SULFATE-K SULFATE-MG SULF 17.5-3.13-1.6 GM/177ML PO SOLN: 1.0000 | Freq: Once | ORAL | Status: DC

## 2015-07-08 NOTE — Progress Notes (Signed)
HISTORY OF PRESENT ILLNESS:  [name] is a 50 y.o. [place] male with past medical history as listed below who is sent today by urgent medical care physician's regarding chief complaints of abdominal bloating and right flank bulging. Patient states that he has had these complaints for many years. Symptoms are worse after meals. No vomiting. The bloating results and abdominal discomfort. He mentions number of different food items, basically the spectrum food, that affects him. Review of outside records from [facility] February 2012 findings the patient was being evaluated for melena and anemia. At that time complete colonoscopy was performed and revealed 3 diminutive colon polyps. Otherwise normal exam including the ileum. 2 polyps were tubular adenomas. Upper endoscopy revealed clean-based duodenal ulcer which was felt secondary to NSAIDs and Helicobacter pylori identified on biopsy. He was treated with PPI. Not clear if he was treated for H. pylori. The examinations were performed by Dr. Wonda Horner. Hemoglobin from last year was normal. He continues on iron. He also use meloxicam. The patient also mentions that his abdominal distention is eccentric. More prominent bulging on the right. 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 He has also had unspecified surgery for peptic ulcer disease remotely. Patient has gained over 35 pounds since 2013.  REVIEW OF SYSTEMS:  All non-GI ROS negative except for back pain, muscle cramps, night sweats, ankle swelling  Past Medical History  Diagnosis Date  . Kidney stones 2007  . PUD (peptic ulcer disease)     Has had unspecified surgery for this  . Hyperlipidemia   . Elevated cholesterol   . Hypertension     no current bp meds for last 3 months  . Anemia   . Back pain   . Otosclerosis of both ears   . Colon polyps     adenomatous    Past Surgical History  Procedure  Laterality Date  . Surgery for ulcers  1990  . Stapedotomy  2005    lt ear jan, right ear sept  . Nephrectomy  02/18/2011    Procedure: NEPHRECTOMY;  Surgeon: Hanley Ben, MD;  Location: [facility];  Service: Urology;  Laterality: Right;  . Small intestine surgery      Social History Riad Slemmer  reports that he has never smoked. He has never used smokeless tobacco. He reports that he does not drink alcohol or use illicit drugs.  family history includes Heart disease in his mother; Heart disease (age of onset: 92) in his brother; Hyperlipidemia in his father, sister, and sister; Hypertension in his mother and sister.  Allergies  Allergen Reactions  . Beef-Derived Products     Cultural preference  . Pegademase Bovine Other (See Comments)    Cultural preference  . Poractant Alfa Other (See Comments)    Cultural preference  . Pork-Derived Products     Cultural preference       PHYSICAL EXAMINATION: Vital signs: BP 168/92 mmHg  Pulse 104  Ht 5\' 9"  (1.753 m)  Wt 254 lb (115.214 kg)  BMI 37.49 kg/m2  Constitutional: generally well-appearing, no acute distress Psychiatric: alert and oriented x3, cooperativeP somewhat anxious Eyes: extraocular movements intact, anicteric, conjunctiva pink Mouth: oral pharynx moist, no lesions Neck: supple without thyromegaly Lymph: no lymphadenopathy Cardiovascular: heart regular rate and rhythm, no murmur Lungs: clear to auscultation bilaterally Abdomen: soft, nontender, nondistended, no obvious ascites, no peritoneal signs, normal bowel sounds, no organomegaly. Large midline incision and right flank  incision well-healed. No hernia Rectal: Deferred until colonoscopy Extremities: no clubbing cyanosis or lower extremity edema bilaterally Skin: no lesions on visible extremities Neuro: No focal deficits. Normal DTRs. Cranial nerves intact   ASSESSMENT:  #1. Chronic abdominal bloating without worrisome features #2. Obesity #3. Right flank bulging  secondary to deficit related to prior surgery (nephrectomy) #4. History of ulcers in the face of NSAIDs and Helicobacter pylori. Continues on NSAIDs. Not clear if Helicobacter pylori was treated #5. His treatment adenomatous colon polyps February 2012. Due for surveillance  PLAN:  #1. Discussion on intestinal gas. Brochure on intestinal gas and anti-gas and flatulence dietary brochure provided #2. Weight loss this will help his abdominal distention and weight flank bulging #3. Surveillance colonoscopy.The nature of the procedure, as well as the risks, benefits, and alternatives were carefully and thoroughly reviewed with the patient. Ample time for discussion and questions allowed. The patient understood, was satisfied, and agreed to proceed. #4. After colonoscopy performed, would recommend upper endoscopy to rule out ongoing problems with ulcer disease and/or H. pylori. May need PPI. May need to stop meloxicam.  A copy of this consultation has been sent to urgent care medical

## 2015-07-08 NOTE — Patient Instructions (Signed)

## 2015-07-09 ENCOUNTER — Encounter: Payer: Self-pay | Admitting: Internal Medicine

## 2015-07-22 ENCOUNTER — Encounter: Payer: 59 | Admitting: Internal Medicine

## 2015-09-03 ENCOUNTER — Ambulatory Visit (INDEPENDENT_AMBULATORY_CARE_PROVIDER_SITE_OTHER): Payer: 59 | Admitting: Cardiology

## 2015-09-05 ENCOUNTER — Other Ambulatory Visit: Payer: Self-pay | Admitting: Cardiology

## 2015-09-06 ENCOUNTER — Other Ambulatory Visit: Payer: Self-pay | Admitting: Cardiology

## 2015-09-07 ENCOUNTER — Other Ambulatory Visit: Payer: Self-pay | Admitting: Cardiology

## 2015-09-08 ENCOUNTER — Encounter: Payer: Self-pay | Admitting: Cardiology

## 2015-09-15 ENCOUNTER — Encounter: Payer: Self-pay | Admitting: Physician Assistant

## 2015-09-15 DIAGNOSIS — N400 Enlarged prostate without lower urinary tract symptoms: Secondary | ICD-10-CM | POA: Insufficient documentation

## 2015-09-15 DIAGNOSIS — E291 Testicular hypofunction: Secondary | ICD-10-CM | POA: Insufficient documentation

## 2015-09-16 ENCOUNTER — Ambulatory Visit (AMBULATORY_SURGERY_CENTER): Payer: 59 | Admitting: Internal Medicine

## 2015-09-16 ENCOUNTER — Encounter: Payer: Self-pay | Admitting: Internal Medicine

## 2015-09-16 VITALS — BP 127/91 | HR 85 | Temp 97.8°F | Resp 10 | Ht 69.0 in | Wt 254.0 lb

## 2015-09-16 DIAGNOSIS — D122 Benign neoplasm of ascending colon: Secondary | ICD-10-CM

## 2015-09-16 DIAGNOSIS — Z86010 Personal history of colonic polyps: Secondary | ICD-10-CM | POA: Diagnosis present

## 2015-09-16 MED ORDER — SODIUM CHLORIDE 0.9 % IV SOLN: 500.0000 mL | INTRAVENOUS | Status: DC

## 2015-09-16 NOTE — Op Note (Signed)
[facility] Patient Name: [name] Procedure Date: 09/16/2015 8:42 AM MRN: [id number] Endoscopist: Docia Chuck. Henrene Pastor , MD Age: 50 Referring MD:  Date of Birth: April 13, 1965 Gender: Male Account #: [id number] Procedure:                Colonoscopy, with cold snare polypectomy X3 Indications:              Surveillance: Personal history of adenomatous                            polyps on last colonoscopy 5 years ago, High risk                            colon cancer surveillance: Personal history of                            non-advanced adenoma. Dr. Paulita Fujita February 2012 Medicines:                Monitored Anesthesia Care Procedure:                Pre-Anesthesia Assessment:                           - Prior to the procedure, a History and Physical                            was performed, and patient medications and                            allergies were reviewed. The patient's tolerance of                            previous anesthesia was also reviewed. The risks                            and benefits of the procedure and the sedation                            options and risks were discussed with the patient.                            All questions were answered, and informed consent                            was obtained. Prior Anticoagulants: The patient has                            taken no previous anticoagulant or antiplatelet                            agents. ASA Grade Assessment: II - A patient with                            mild systemic disease. After reviewing the risks  and benefits, the patient was deemed in                            satisfactory condition to undergo the procedure.                           After obtaining informed consent, the colonoscope                            was passed under direct vision. Throughout the                            procedure, the patient's blood pressure, pulse, and    oxygen saturations were monitored continuously. The                            Model CF-HQ190L [id number]) scope was introduced                            through the anus and advanced to the the cecum,                            identified by appendiceal orifice and ileocecal                            valve. The ileocecal valve, appendiceal orifice,                            and rectum were photographed. The quality of the                            bowel preparation was excellent. The colonoscopy                            was performed without difficulty. The patient                            tolerated the procedure well. The bowel preparation                            used was SUPREP. Scope In: 8:50:57 AM Scope Out: 9:05:14 AM Scope Withdrawal Time: 0 hours 12 minutes 43 seconds  Total Procedure Duration: 0 hours 14 minutes 17 seconds  Findings:                 Three polyps were found in the ascending colon. The                            polyps were 2 to 5 mm in size. These polyps were                            removed with a cold snare. Resection and retrieval  were complete.                           Internal hemorrhoids were found during retroflexion.                           The exam was otherwise without abnormality on                            direct and retroflexion views. Complications:            No immediate complications. Estimated blood loss:                            None. Estimated Blood Loss:     Estimated blood loss: none. Impression:               - Three 2 to 5 mm polyps in the ascending colon,                            removed with a cold snare. Resected and retrieved.                           - Internal hemorrhoids.                           - The examination was otherwise normal on direct                            and retroflexion views. Recommendation:           - Repeat colonoscopy in 3 - 5 years for                             surveillance, pending pathology results.                           - Schedule EGD in [place] "abdominal pain, history of                            ulcer disease".                           - Continue present medications.                           - Await pathology results. Docia Chuck. Henrene Pastor, MD 09/16/2015 9:09:43 AM This report has been signed electronically. CC Letter to:             Barbara B. Mcpherson

## 2015-09-16 NOTE — Patient Instructions (Signed)
YOU HAD AN ENDOSCOPIC PROCEDURE TODAY AT [facility]:   Refer to the procedure report that was given to you for any specific questions about what was found during the examination.  If the procedure report does not answer your questions, please call your gastroenterologist to clarify.  If you requested that your care partner not be given the details of your procedure findings, then the procedure report has been included in a sealed envelope for you to review at your convenience later.  YOU SHOULD EXPECT: Some feelings of bloating in the abdomen. Passage of more gas than usual.  Walking can help get rid of the air that was put into your GI tract during the procedure and reduce the bloating. If you had a lower endoscopy (such as a colonoscopy or flexible sigmoidoscopy) you may notice spotting of blood in your stool or on the toilet paper. If you underwent a bowel prep for your procedure, you may not have a normal bowel movement for a few days.  Please Note:  You might notice some irritation and congestion in your nose or some drainage.  This is from the oxygen used during your procedure.  There is no need for concern and it should clear up in a day or so.  SYMPTOMS TO REPORT IMMEDIATELY:   Following lower endoscopy (colonoscopy or flexible sigmoidoscopy):  Excessive amounts of blood in the stool  Significant tenderness or worsening of abdominal pains  Swelling of the abdomen that is new, acute  Fever of 100F or higher    For urgent or emergent issues, a gastroenterologist can be reached at any hour by calling [phone].   DIET: Your first meal following the procedure should be a small meal and then it is ok to progress to your normal diet. Heavy or fried foods are harder to digest and may make you feel nauseous or bloated.  Likewise, meals heavy in dairy and vegetables can increase bloating.  Drink plenty of fluids but you should avoid alcoholic beverages for 24  hours.  ACTIVITY:  You should plan to take it easy for the rest of today and you should NOT DRIVE or use heavy machinery until tomorrow (because of the sedation medicines used during the test).    FOLLOW UP: Our staff will call the number listed on your records the next business day following your procedure to check on you and address any questions or concerns that you may have regarding the information given to you following your procedure. If we do not reach you, we will leave a message.  However, if you are feeling well and you are not experiencing any problems, there is no need to return our call.  We will assume that you have returned to your regular daily activities without incident.  If any biopsies were taken you will be contacted by phone or by letter within the next 1-3 weeks.  Please call us at [phone] if you have not heard about the biopsies in 3 weeks.    SIGNATURES/CONFIDENTIALITY: You and/or your care partner have signed paperwork which will be entered into your electronic medical record.  These signatures attest to the fact that that the information above on your After Visit Summary has been reviewed and is understood.  Full responsibility of the confidentiality of this discharge information lies with you and/or your care-partner.   Resume medcations. Information given on polyps,hemorrhoids and high fiber diet.

## 2015-09-16 NOTE — Progress Notes (Signed)
To pacu vss patent aw report to rn 

## 2015-09-16 NOTE — Progress Notes (Signed)
Called to room to assist during endoscopic procedure.  Patient ID and intended procedure confirmed with present staff. Received instructions for my participation in the procedure from the performing physician.  

## 2015-09-17 ENCOUNTER — Telehealth: Payer: Self-pay | Admitting: *Deleted

## 2015-09-17 NOTE — Telephone Encounter (Signed)
  Follow up Call-  Call back number 09/16/2015  Post procedure Call Back phone  # [phone]  Permission to leave phone message Yes  Some recent data might be hidden     No answer,left message

## 2015-09-21 ENCOUNTER — Ambulatory Visit (AMBULATORY_SURGERY_CENTER): Payer: Self-pay | Admitting: *Deleted

## 2015-09-21 ENCOUNTER — Encounter: Payer: Self-pay | Admitting: Internal Medicine

## 2015-09-21 ENCOUNTER — Encounter: Payer: Self-pay | Admitting: *Deleted

## 2015-09-21 VITALS — Ht 69.0 in | Wt 254.0 lb

## 2015-09-21 DIAGNOSIS — R109 Unspecified abdominal pain: Secondary | ICD-10-CM

## 2015-09-21 NOTE — Progress Notes (Signed)
No egg or soy allergy  No anesthesia or intubation problems per pt  No diet medications taken   

## 2015-09-28 ENCOUNTER — Encounter: Payer: Self-pay | Admitting: Internal Medicine

## 2015-10-05 ENCOUNTER — Encounter: Payer: Self-pay | Admitting: Internal Medicine

## 2015-10-05 ENCOUNTER — Other Ambulatory Visit: Payer: Self-pay | Admitting: Internal Medicine

## 2015-10-05 ENCOUNTER — Ambulatory Visit (AMBULATORY_SURGERY_CENTER): Payer: 59 | Admitting: Internal Medicine

## 2015-10-05 VITALS — BP 147/83 | HR 77 | Temp 99.6°F | Resp 12 | Ht 69.0 in | Wt 254.0 lb

## 2015-10-05 DIAGNOSIS — R109 Unspecified abdominal pain: Secondary | ICD-10-CM

## 2015-10-05 DIAGNOSIS — K2980 Duodenitis without bleeding: Secondary | ICD-10-CM | POA: Diagnosis not present

## 2015-10-05 MED ORDER — OMEPRAZOLE 20 MG PO CPDR: DELAYED_RELEASE_CAPSULE | ORAL | 3 refills | Status: DC

## 2015-10-05 MED ORDER — SODIUM CHLORIDE 0.9 % IV SOLN: 500.0000 mL | INTRAVENOUS | Status: DC

## 2015-10-05 NOTE — Progress Notes (Signed)
Called to room to assist during endoscopic procedure.  Patient ID and intended procedure confirmed with present staff. Received instructions for my participation in the procedure from the performing physician.  

## 2015-10-05 NOTE — Progress Notes (Signed)
A/ox3 pleased with MAC, report to Sheila RN 

## 2015-10-05 NOTE — Patient Instructions (Signed)
YOU HAD AN ENDOSCOPIC PROCEDURE TODAY AT THE [facility]:   Refer to the procedure report that was given to you for any specific questions about what was found during the examination.  If the procedure report does not answer your questions, please call your gastroenterologist to clarify.  If you requested that your care partner not be given the details of your procedure findings, then the procedure report has been included in a sealed envelope for you to review at your convenience later.  YOU SHOULD EXPECT: Some feelings of bloating in the abdomen. Passage of more gas than usual.  Walking can help get rid of the air that was put into your GI tract during the procedure and reduce the bloating. If you had a lower endoscopy (such as a colonoscopy or flexible sigmoidoscopy) you may notice spotting of blood in your stool or on the toilet paper. If you underwent a bowel prep for your procedure, you may not have a normal bowel movement for a few days.  Please Note:  You might notice some irritation and congestion in your nose or some drainage.  This is from the oxygen used during your procedure.  There is no need for concern and it should clear up in a day or so.  SYMPTOMS TO REPORT IMMEDIATELY:     Following upper endoscopy (EGD)  Vomiting of blood or coffee ground material  New chest pain or pain under the shoulder blades  Painful or persistently difficult swallowing  New shortness of breath  Fever of 100F or higher  Black, tarry-looking stools  For urgent or emergent issues, a gastroenterologist can be reached at any hour by calling [phone].   DIET:  We do recommend a small meal at first, but then you may proceed to your regular diet.  Drink plenty of fluids but you should avoid alcoholic beverages for 24 hours.  ACTIVITY:  You should plan to take it easy for the rest of today and you should NOT DRIVE or use heavy machinery until tomorrow (because of the sedation medicines  used during the test).    FOLLOW UP: Our staff will call the number listed on your records the next business day following your procedure to check on you and address any questions or concerns that you may have regarding the information given to you following your procedure. If we do not reach you, we will leave a message.  However, if you are feeling well and you are not experiencing any problems, there is no need to return our call.  We will assume that you have returned to your regular daily activities without incident.  If any biopsies were taken you will be contacted by phone or by letter within the next 1-3 weeks.  Please call us at [phone] if you have not heard about the biopsies in 3 weeks.    SIGNATURES/CONFIDENTIALITY: You and/or your care partner have signed paperwork which will be entered into your electronic medical record.  These signatures attest to the fact that that the information above on your After Visit Summary has been reviewed and is understood.  Full responsibility of the confidentiality of this discharge information lies with you and/or your care-partner.   Resume medications. 

## 2015-10-05 NOTE — Op Note (Signed)
[facility] Patient Name: [name] Procedure Date: 10/05/2015 10:20 AM MRN: [id number] Endoscopist: Docia Chuck. Henrene Pastor , MD Age: 50 Referring MD:  Date of Birth: 1965-04-20 Gender: Male Account #: [id number] Procedure:                Upper GI endoscopy, with biopsy Indications:              Abdominal pain Medicines:                Monitored Anesthesia Care Procedure:                Pre-Anesthesia Assessment:                           - Prior to the procedure, a History and Physical                            was performed, and patient medications and                            allergies were reviewed. The patient's tolerance of                            previous anesthesia was also reviewed. The risks                            and benefits of the procedure and the sedation                            options and risks were discussed with the patient.                            All questions were answered, and informed consent                            was obtained. Prior Anticoagulants: The patient has                            taken no previous anticoagulant or antiplatelet                            agents. ASA Grade Assessment: II - A patient with                            mild systemic disease. After reviewing the risks                            and benefits, the patient was deemed in                            satisfactory condition to undergo the procedure.                           After obtaining informed consent, the endoscope was  passed under direct vision. Throughout the                            procedure, the patient's blood pressure, pulse, and                            oxygen saturations were monitored continuously. The                            Model GIF-[id number]) scope was introduced                            through the mouth, and advanced to the second part                            of duodenum. The upper GI  endoscopy was                            accomplished without difficulty. The patient                            tolerated the procedure well. Scope In: Scope Out: Findings:                 The esophagus was normal.                           The stomach was normal.                           The cardia and gastric fundus were normal on                            retroflexion.                           A single erosion was found in the duodenal bulb.                            Biopsies were taken with a cold forceps for                            Helicobacter pylori testing using CLOtest. The                            remainder of the duodenum was normal. Complications:            No immediate complications. Estimated Blood Loss:     Estimated blood loss: none. Impression:               - Duodenal erosion. Otherwise normal EGD. Status                            post CLO biopsy. Recommendation:           - Prescribe omeprazole 20 mg daily; #30; 3 refills.                           -  Resume previous diet.                           - Continue present medications.                           - Treat for Helicobacter pylori if CLO test                            positive. Recommend using PPI if also using NSAIDs                            given history of ulcer disease John N. Henrene Pastor, MD 10/05/2015 10:37:36 AM This report has been signed electronically.

## 2015-10-06 ENCOUNTER — Telehealth: Payer: Self-pay | Admitting: *Deleted

## 2015-10-06 LAB — HELICOBACTER PYLORI SCREEN-BIOPSY: UREASE: NEGATIVE

## 2015-10-06 NOTE — Telephone Encounter (Signed)
  Follow up Call-  Call back number 10/05/2015 09/16/2015  Post procedure Call Back phone  # [phone]  Permission to leave phone message Yes Yes  Some recent data might be hidden    [facility]

## 2015-10-19 ENCOUNTER — Ambulatory Visit (HOSPITAL_COMMUNITY)
Admission: EM | Admit: 2015-10-19 | Discharge: 2015-10-19 | Disposition: A | Discharge status: Home / Self Care | Payer: 59 | Attending: Family Medicine | Admitting: Family Medicine

## 2015-10-19 ENCOUNTER — Encounter (HOSPITAL_COMMUNITY): Payer: Self-pay | Admitting: *Deleted

## 2015-10-19 DIAGNOSIS — R197 Diarrhea, unspecified: Secondary | ICD-10-CM

## 2015-10-19 DIAGNOSIS — K529 Noninfective gastroenteritis and colitis, unspecified: Secondary | ICD-10-CM

## 2015-10-19 LAB — POCT I-STAT, CHEM 8
BUN: 25 mg/dL — AB (ref 6–20)
Calcium, Ion: 1.16 mmol/L (ref 1.15–1.40)
Chloride: 104 mmol/L (ref 101–111)
Creatinine, Ser: 1.5 mg/dL — ABNORMAL HIGH (ref 0.61–1.24)
Glucose, Bld: 91 mg/dL (ref 65–99)
HEMATOCRIT: 42 % (ref 39.0–52.0)
HEMOGLOBIN: 14.3 g/dL (ref 13.0–17.0)
Potassium: 4.1 mmol/L (ref 3.5–5.1)
SODIUM: 139 mmol/L (ref 135–145)
TCO2: 23 mmol/L (ref 0–100)

## 2015-10-19 MED ORDER — LOPERAMIDE HCL 2 MG PO CAPS: 4.0000 mg | ORAL_CAPSULE | Freq: Four times a day (QID) | ORAL | 0 refills | Status: DC | PRN

## 2015-10-19 NOTE — ED Triage Notes (Signed)
Interpreter offered - pt declined.  C/O starting with diarrhea yesterday; has an episode each time he eats or drinks anything.  Denies vomiting, but states "everything tastes bad".  Denies any abd pain.  C/O HA, back pain.  Finished a course of prednisone yesterday for back pain.  Had endoscopy procedure 8/2 - was told "something was removed, maybe infection".

## 2015-10-19 NOTE — ED Provider Notes (Signed)
[facility]    CSN: [id number] Arrival date & time: 10/19/15  1526  First Provider Contact:  First MD Initiated Contact with Patient 10/19/15 1643        History   Chief Complaint Chief Complaint  Patient presents with  . Diarrhea    HPI Hance Rensel is a 50 y.o. male.   The history is provided by the patient.  Diarrhea  Quality:  Watery Severity:  Moderate Onset quality:  Sudden Duration:  1 day Progression:  Unchanged Relieved by:  Nothing Worsened by:  Nothing Ineffective treatments:  None tried Associated symptoms: fever   Associated symptoms: no abdominal pain and no vomiting   Risk factors comment:  Colonoscopy 60mo ago, recent prednisone for back issue.   Past Medical History:  Diagnosis Date  . Anemia   . Back pain   . Colon polyps    adenomatous  . Elevated cholesterol   . Hyperlipidemia   . Hypertension    no current bp meds for last 3 months  . Kidney stones 2007  . Otosclerosis of both ears   . PUD (peptic ulcer disease)    Has had unspecified surgery for this  . Sleep apnea     has appt on 09-22-15 to get for CPAP    Patient Active Problem List   Diagnosis Date Noted  . Testicular hypofunction 09/15/2015  . BPH without obstruction/lower urinary tract symptoms 09/15/2015  . Chest pain 03/29/2013  . Hyperlipidemia 03/29/2013  . Nephrolithiasis 03/14/2013  . Acute blood loss anemia 03/14/2013  . Lumbar radiculopathy 02/26/2013  . Dyspnea 11/25/2011  . Leg pain, left 11/25/2011  . Vitamin D deficiency 08/03/2011  . Constipation 04/26/2011  . HTN (hypertension) 04/10/2010  . HLD (hyperlipidemia) 04/10/2010    Past Surgical History:  Procedure Laterality Date  . NEPHRECTOMY  02/18/2011   Procedure: NEPHRECTOMY;  Surgeon: Hanley Ben, MD;  Location: [facility];  Service: Urology;  Laterality: Right;  . SMALL INTESTINE SURGERY    . STAPEDOTOMY  2005   lt ear jan, right ear sept  . surgery for ulcers  1990       Home  Medications    Prior to Admission medications   Medication Sig Start Date End Date Taking? Authorizing Provider  amLODipine (NORVASC) 5 MG tablet Take 1 tablet (5 mg total) by mouth daily. APPOINTMENT IS NEEDED 09/07/15  Yes [name], MD  atorvastatin (LIPITOR) 40 MG tablet  05/18/15  Yes Historical Provider, MD  omeprazole (PRILOSEC) 20 MG capsule TAKE 1 CAPSULE BY MOUTH ONCE DAILY 10/05/15  Yes Irene Shipper, MD  predniSONE (STERAPRED UNI-PAK 21 TAB) 10 MG (21) TBPK tablet Take 10 mg by mouth daily.   Yes Historical Provider, MD  testosterone cypionate (DEPOTESTOTERONE CYPIONATE) 200 MG/ML injection  02/11/14  Yes Historical Provider, MD    Family History Family History  Problem Relation Age of Onset  . Hyperlipidemia Father   . Heart disease Mother   . Hypertension Mother   . Hypertension Sister   . Heart disease Brother 11    CABG  . Hyperlipidemia Sister   . Hyperlipidemia Sister   . Esophageal cancer Cousin   . Colon cancer Neg Hx   . Stomach cancer Neg Hx   . Rectal cancer Neg Hx     Social History Social History  Substance Use Topics  . Smoking status: Never Smoker  . Smokeless tobacco: Never Used  . Alcohol use No     Allergies  Beef-derived products; Pegademase bovine; Poractant alfa; and Pork-derived products   Review of Systems Review of Systems  Constitutional: Positive for fever.  HENT: Negative.   Respiratory: Negative.   Gastrointestinal: Positive for diarrhea. Negative for abdominal pain, blood in stool, nausea and vomiting.  Genitourinary: Negative.   All other systems reviewed and are negative.    Physical Exam Triage Vital Signs ED Triage Vitals  Enc Vitals Group     BP 10/19/15 1622 142/73     Pulse Rate 10/19/15 1622 85     Resp 10/19/15 1622 12     Temp 10/19/15 1622 99.1 F (37.3 C)     Temp Source 10/19/15 1622 Oral     SpO2 10/19/15 1622 100 %     Weight --      Height --      Head Circumference --      Peak Flow --       Pain Score 10/19/15 1649 7     Pain Loc --      Pain Edu? --      Excl. in [place]? --    No data found.   Updated Vital Signs BP 142/73 (BP Location: Left Arm)   Pulse 85   Temp 99.1 F (37.3 C) (Oral)   Resp 12   SpO2 100%   Visual Acuity Right Eye Distance:   Left Eye Distance:   Bilateral Distance:    Right Eye Near:   Left Eye Near:    Bilateral Near:     Physical Exam  Constitutional: He is oriented to person, place, and time. He appears well-developed and well-nourished. No distress.  Pulmonary/Chest: Effort normal and breath sounds normal.  Abdominal: Soft. Bowel sounds are normal. He exhibits no distension. There is no tenderness. There is no guarding.  Neurological: He is alert and oriented to person, place, and time.  Skin: Skin is warm and dry.  Nursing note and vitals reviewed.    UC Treatments / Results  Labs (all labs ordered are listed, but only abnormal results are displayed) Labs Reviewed - No data to display  bun/creat abnl.on I-stat  EKG  EKG Interpretation None       Radiology No results found.  Procedures Procedures (including critical care time)  Medications Ordered in UC Medications - No data to display   Initial Impression / Assessment and Plan / UC Course  I have reviewed the triage vital signs and the nursing notes.  Pertinent labs & imaging results that were available during my care of the patient were reviewed by me and considered in my medical decision making (see chart for details).  Clinical Course      Final Clinical Impressions(s) / UC Diagnoses   Final diagnoses:  None    New Prescriptions New Prescriptions   No medications on file     Billy Fischer, MD 10/19/15 1725

## 2015-10-19 NOTE — Discharge Instructions (Signed)
Clear liquid diet tonight as tolerated, advance on tues as improved, use medicine as needed, return or see your doctor if any problems.

## 2015-11-03 ENCOUNTER — Other Ambulatory Visit: Payer: Self-pay | Admitting: Cardiology

## 2015-11-04 ENCOUNTER — Other Ambulatory Visit: Payer: Self-pay | Admitting: Cardiology

## 2015-12-09 ENCOUNTER — Other Ambulatory Visit: Payer: Self-pay | Admitting: Cardiology

## 2015-12-09 ENCOUNTER — Encounter: Payer: Self-pay | Admitting: Physician Assistant

## 2015-12-09 ENCOUNTER — Encounter (INDEPENDENT_AMBULATORY_CARE_PROVIDER_SITE_OTHER): Payer: Self-pay

## 2015-12-09 ENCOUNTER — Ambulatory Visit (INDEPENDENT_AMBULATORY_CARE_PROVIDER_SITE_OTHER): Payer: 59 | Admitting: Physician Assistant

## 2015-12-09 ENCOUNTER — Ambulatory Visit: Payer: 59 | Admitting: Physician Assistant

## 2015-12-09 VITALS — BP 140/90 | HR 75 | Ht 69.0 in | Wt 246.8 lb

## 2015-12-09 DIAGNOSIS — E782 Mixed hyperlipidemia: Secondary | ICD-10-CM | POA: Diagnosis not present

## 2015-12-09 DIAGNOSIS — I10 Essential (primary) hypertension: Secondary | ICD-10-CM | POA: Diagnosis not present

## 2015-12-09 DIAGNOSIS — R0602 Shortness of breath: Secondary | ICD-10-CM

## 2015-12-09 DIAGNOSIS — G4733 Obstructive sleep apnea (adult) (pediatric): Secondary | ICD-10-CM

## 2015-12-09 DIAGNOSIS — R5383 Other fatigue: Secondary | ICD-10-CM | POA: Diagnosis not present

## 2015-12-09 LAB — BASIC METABOLIC PANEL
BUN: 24 mg/dL (ref 7–25)
CALCIUM: 9.5 mg/dL (ref 8.6–10.3)
CHLORIDE: 105 mmol/L (ref 98–110)
CO2: 26 mmol/L (ref 20–31)
Creat: 1.53 mg/dL — ABNORMAL HIGH (ref 0.70–1.33)
GLUCOSE: 91 mg/dL (ref 65–99)
Potassium: 4.3 mmol/L (ref 3.5–5.3)
SODIUM: 140 mmol/L (ref 135–146)

## 2015-12-09 LAB — LIPID PANEL
Cholesterol: 208 mg/dL — ABNORMAL HIGH (ref 125–200)
HDL: 39 mg/dL — ABNORMAL LOW (ref >=40)
TRIGLYCERIDES: 446 mg/dL — AB (ref <150)
Total CHOL/HDL Ratio: 5.3 Ratio — ABNORMAL HIGH (ref <=5.0)

## 2015-12-09 LAB — CBC WITH DIFFERENTIAL/PLATELET
BASOS ABS: 0 {cells}/uL (ref 0–200)
Basophils Relative: 0 %
EOS ABS: 162 {cells}/uL (ref 15–500)
Eosinophils Relative: 2 %
HEMATOCRIT: 41.7 % (ref 38.5–50.0)
HEMOGLOBIN: 14.2 g/dL (ref 13.2–17.1)
LYMPHS ABS: 2268 {cells}/uL (ref 850–3900)
Lymphocytes Relative: 28 %
MCH: 28.2 pg (ref 27.0–33.0)
MCHC: 34.1 g/dL (ref 32.0–36.0)
MCV: 82.7 fL (ref 80.0–100.0)
MONO ABS: 729 {cells}/uL (ref 200–950)
MPV: 12.5 fL (ref 7.5–12.5)
Monocytes Relative: 9 %
NEUTROS ABS: 4941 {cells}/uL (ref 1500–7800)
NEUTROS PCT: 61 %
Platelets: 205 10*3/uL (ref 140–400)
RBC: 5.04 MIL/uL (ref 4.20–5.80)
RDW: 14.1 % (ref 11.0–15.0)
WBC: 8.1 10*3/uL (ref 3.8–10.8)

## 2015-12-09 LAB — HEPATIC FUNCTION PANEL
ALBUMIN: 3.8 g/dL (ref 3.6–5.1)
ALK PHOS: 51 U/L (ref 40–115)
ALT: 20 U/L (ref 9–46)
AST: 20 U/L (ref 10–35)
BILIRUBIN INDIRECT: 0.6 mg/dL (ref 0.2–1.2)
Bilirubin, Direct: 0.1 mg/dL (ref <=0.2)
TOTAL PROTEIN: 6.4 g/dL (ref 6.1–8.1)
Total Bilirubin: 0.7 mg/dL (ref 0.2–1.2)

## 2015-12-09 MED ORDER — ATORVASTATIN CALCIUM 40 MG PO TABS: 40.0000 mg | ORAL_TABLET | Freq: Every day | ORAL | 3 refills | Status: DC

## 2015-12-09 MED ORDER — AMLODIPINE BESYLATE 5 MG PO TABS: 5.0000 mg | ORAL_TABLET | Freq: Every day | ORAL | 3 refills | Status: DC

## 2015-12-09 MED ORDER — FENOFIBRATE 160 MG PO TABS: 160.0000 mg | ORAL_TABLET | Freq: Every day | ORAL | 3 refills | Status: DC

## 2015-12-09 NOTE — Patient Instructions (Addendum)
Medication Instructions:  REFILLS HAVE BEEN SENT IN FOR NORVASC, LIPITOR, FENOFIBRATE  Labwork: 1. TODAY BMET, TSH, CBC W/DIFF, LFT, LIPID, BNP  Testing/Procedures: Your physician has requested that you have an echocardiogram. Echocardiography is a painless test that uses sound waves to create images of your heart. It provides your doctor with information about the size and shape of your heart and how well your heart's chambers and valves are working. This procedure takes approximately one hour. There are no restrictions for this procedure.  Follow-Up: DR. [name] IN ABOUT 1 MONTH YOU WILL NEED TO FOLLOW UP WITH YOUR PRIMARY CARE DOCTOR OR YOUR SLEEP DOCTOR   Any Other Special Instructions Will Be Listed Below (If Applicable).  If you need a refill on your cardiac medications before your next appointment, please call your pharmacy.

## 2015-12-09 NOTE — Telephone Encounter (Signed)
amLODipine (NORVASC) 5 MG tablet  Medication  Date: 12/09/2015 Department: [address] Office Ordering/Authorizing: [name], MD  Order Providers   Prescribing Provider Encounter Provider  [name], MD Liliane Shi, PA-C  Medication Detail    Disp Refills Start End   amLODipine (NORVASC) 5 MG tablet 90 tablet 3 12/09/2015    Sig - Route: Take 1 tablet (5 mg total) by mouth daily. APPOINTMENT IS NEEDED - Oral   E-Prescribing Status: Receipt confirmed by pharmacy (12/09/2015 12:56 PM EDT)   Pharmacy   [facility] - [place], [place]

## 2015-12-09 NOTE — Progress Notes (Signed)
Cardiology Office Note:    Date:  12/09/2015   ID:  Johnnette Litter, DOB 08/12/1965, MRN [id number]  PCP:  Ellsworth Lennox, MD  Cardiologist:  Dr. Ena Dawley   Electrophysiologist:  n/a  Referring MD: Barton Fanny, MD   Chief Complaint  Patient presents with  . Follow-up    Hypertension, hyperlipidemia    History of Present Illness:    Arzell Mcgeehan is a 50 y.o. [place] male with a hx of Neuropathy, chronic fatigue, peptic ulcer disease status post prior gastric surgery in [place], HTN, HL, CKD (solitary kidney - R kidney removed). Myoview in 3/15 was low risk and negative for ischemia. Echocardiogram in 2013 demonstrated normal LV function. Last seen by Dr. [name] 3/16. He has been followed in the lipid clinic due to hypertriglyceridemia. Triglycerides were over 1000 at one point.   He comes in today alone.  He was actually on another provider's schedule.  That provider is not here today and he waited in the waiting room for > 2 hours before it was realized he was here incorrectly.  Therefore, he was added on to my schedule. He is here without an interpreter.  He is able to speak [place].  But his English is not that good and after several minutes, I was unable to get a good hx on why he is here today or what types of symptoms he has at this time.  He continuously tells me that his O2 is 61% on a machine at home.  He seems to describe using a CPAP machine for OSA. He does tell me that he has a sleep medicine physician. Unfortunately, we were unable to get an interpreter. I spent a great deal of time trying to obtain a rudimentary history from the patient. He does not seem to describe chest pain. He seems to describe shortness of breath at times. However, at times, he denies this. He mainly describes fatigue. This has been documented in his chart previously. He was apparently followed by neurology at one point. He notes some leg edema at times. He denies syncope.   Prior CV studies that  were reviewed today include:    Myoview 3/15 Overall Impression:   Normal stress nuclear study. This is a low risk scan. There is no scar or ischemia. LV Ejection Fraction: 63%.  LV Wall Motion:  Normal Wall Motion.  Echo 10/13 EF 60-65, normal wall motion, PASP 33  LE arterial Doppler/ABI Normal ABIs: R 1.2; L 1.1  Past Medical History:  Diagnosis Date  . Anemia   . Back pain   . Colon polyps    adenomatous  . Elevated cholesterol   . Hyperlipidemia   . Hypertension    no current bp meds for last 3 months  . Kidney stones 2007  . Otosclerosis of both ears   . PUD (peptic ulcer disease)    Has had unspecified surgery for this  . Sleep apnea     has appt on 09-22-15 to get for CPAP    Past Surgical History:  Procedure Laterality Date  . NEPHRECTOMY  02/18/2011   Procedure: NEPHRECTOMY;  Surgeon: Hanley Ben, MD;  Location: [facility];  Service: Urology;  Laterality: Right;  . SMALL INTESTINE SURGERY    . STAPEDOTOMY  2005   lt ear jan, right ear sept  . surgery for ulcers  1990    Current Medications: Current Meds  Medication Sig  . amLODipine (NORVASC) 5 MG tablet Take 1 tablet (5 mg total) by  mouth daily. APPOINTMENT IS NEEDED  . atorvastatin (LIPITOR) 40 MG tablet Take 1 tablet (40 mg total) by mouth daily.  . fenofibrate 160 MG tablet Take 1 tablet (160 mg total) by mouth daily. Take with your largest meal.  . omeprazole (PRILOSEC) 20 MG capsule Take 20 mg by mouth daily.   . [DISCONTINUED] amLODipine (NORVASC) 5 MG tablet Take 1 tablet (5 mg total) by mouth daily. APPOINTMENT IS NEEDED  . [DISCONTINUED] atorvastatin (LIPITOR) 40 MG tablet Take 40 mg by mouth daily.   . [DISCONTINUED] fenofibrate 160 MG tablet Take 1 tablet (160 mg total) by mouth daily. Take with your largest meal. YOU MUST CALL AND SCHEDULE APPT FOR FURTHER REFILLS. FINAL ATTEMPT     Allergies:   Beef-derived products; Pegademase bovine; Poractant alfa; and Pork-derived products   Social  History   Social History  . Marital status: Married    Spouse name: N/A  . Number of children: 4  . Years of education: N/A   Occupational History  .  [employer]   Social History Main Topics  . Smoking status: Never Smoker  . Smokeless tobacco: Never Used  . Alcohol use No  . Drug use: No  . Sexual activity: Not Asked   Other Topics Concern  . None   Social History Narrative   Lives at home with wife and family. He is from [place]. Came to the [place] in 2002.     Family History:  The patient's family history includes Esophageal cancer in his cousin; Heart disease in his mother; Heart disease (age of onset: 64) in his brother; Hyperlipidemia in his father, sister, and sister; Hypertension in his mother and sister.   ROS:   Please see the history of present illness.    Review of Systems  Constitution: Positive for fever.  HENT: Positive for headaches.   Cardiovascular: Positive for dyspnea on exertion and leg swelling.  Respiratory: Positive for cough, shortness of breath and snoring.   Skin: Positive for rash.  Musculoskeletal: Positive for back pain, joint pain, joint swelling and myalgias.  Gastrointestinal: Positive for abdominal pain.  Neurological: Positive for loss of balance.   All other systems reviewed and are negative.   EKGs/Labs/Other Test Reviewed:    EKG:  EKG is  ordered today.  The ekg ordered today demonstrates NSR, HR 75, left axis deviation, incomplete RBBB, QTc 410 ms, no change since prior tracings  Recent Labs: 10/19/2015: BUN 25; Creatinine, Ser 1.50; Hemoglobin 14.3; Potassium 4.1; Sodium 139   Recent Lipid Panel    Component Value Date/Time   CHOL 244 (H) 09/18/2014 1616   TRIG 432 (H) 09/18/2014 1616   HDL 37 (L) 09/18/2014 1616   CHOLHDL 6.6 (H) 09/18/2014 1616   VLDL NOT CALC 09/18/2014 1616   LDLCALC NOT CALC 09/18/2014 1616   LDLDIRECT 139.0 05/06/2014 1209     Physical Exam:    VS:  BP 140/90   Pulse 75   Ht 5\' 9"  (1.753 m)    Wt 246 lb 12.8 oz (111.9 kg)   BMI 36.45 kg/m     Wt Readings from Last 3 Encounters:  12/09/15 246 lb 12.8 oz (111.9 kg)  10/05/15 254 lb (115.2 kg)  09/21/15 254 lb (115.2 kg)     Physical Exam  Constitutional: He is oriented to person, place, and time. He appears well-developed and well-nourished. No distress.  HENT:  Head: Normocephalic and atraumatic.  Eyes: No scleral icterus.  Neck: No JVD present.  Cardiovascular:  Normal rate, regular rhythm and normal heart sounds.   No murmur heard. Pulmonary/Chest: Effort normal. He has no wheezes. He has no rales.  Abdominal: Soft. There is no tenderness.  Musculoskeletal: He exhibits edema.  Trace bilateral LE edema  Neurological: He is alert and oriented to person, place, and time.  Skin: Skin is warm and dry.  Psychiatric: He has a normal mood and affect.    ASSESSMENT:    1. Fatigue, unspecified type   2. Shortness of breath   3. Essential hypertension   4. Mixed hyperlipidemia   5. OSA (obstructive sleep apnea)    PLAN:    In order of problems listed above:  1. Fatigue - This seems to be his biggest complaint. Creatinine in 9/17 was 1.5. His hemoglobin at that time was 14.3. TSH in 8/16 was 1.008. I have asked him to continue to follow-up with his primary care physician. He also should follow-up with his sleep medicine physician as it seems that his CPAP machine is not correctly working, as best as I can tell. His ECG is basically unchanged. He may be describing some dyspnea.  -  Labs today BMET, BNP, CBC, TSH, lipids, LFTs  -  Arrange echocardiogram  -  Follow-up with PCP in sleep medicine physician.  2. Shortness of breath - No clear evidence of CHF on exam.  Will get BNP. He will also get an echo.  If he has diastolic dysfunction, question if he would do better on a low dose thiazide diuretic to control his blood pressure.  If Echo, BNP normal, he should FU with sleep medicine.  He may need to see a Pulmonologist.    3. HTN - Borderline control. Obtain labs, Echo first.  At FU office visit, his BP can be addressed again.  We can decide on medication changes based upon testing.  With his edema, it may be better to take him off of Amlodipine and consider an ACE or diuretic.    4. HL - He has had high trigs in the past. Will get Lipids and LFTs today.  We may need to send him back to the Lipid Clinic. Continue Atorvastatin 40 and Fenofibrate.   5. OSA - FU with sleep medicine.   Total time spent with patient today 45 minutes. This includes reviewing records, evaluating the patient and coordinating care. Face-to-face time >50%.   Medication Adjustments/Labs and Tests Ordered: Current medicines are reviewed at length with the patient today.  Concerns regarding medicines are outlined above.  Medication changes, Labs and Tests ordered today are outlined in the Patient Instructions noted below. Patient Instructions  Medication Instructions:  REFILLS HAVE BEEN SENT IN FOR NORVASC, LIPITOR, FENOFIBRATE  Labwork: 1. TODAY BMET, TSH, CBC W/DIFF, LFT, LIPID, BNP  Testing/Procedures: Your physician has requested that you have an echocardiogram. Echocardiography is a painless test that uses sound waves to create images of your heart. It provides your doctor with information about the size and shape of your heart and how well your heart's chambers and valves are working. This procedure takes approximately one hour. There are no restrictions for this procedure.  Follow-Up: DR. [name] IN ABOUT 1 MONTH YOU WILL NEED TO FOLLOW UP WITH YOUR PRIMARY CARE DOCTOR OR YOUR SLEEP DOCTOR   Any Other Special Instructions Will Be Listed Below (If Applicable).  If you need a refill on your cardiac medications before your next appointment, please call your pharmacy.  Signed, [name], PA-C  12/09/2015 5:29 PM  [facility] [address], [place], [place]  [address] Phone: [phone]; Fax: [phone]

## 2015-12-10 ENCOUNTER — Other Ambulatory Visit: Payer: Self-pay | Admitting: *Deleted

## 2015-12-10 LAB — TSH: TSH: 0.73 m[IU]/L (ref 0.40–4.50)

## 2015-12-10 LAB — BRAIN NATRIURETIC PEPTIDE: Brain Natriuretic Peptide: 10.8 pg/mL (ref <100)

## 2015-12-15 ENCOUNTER — Other Ambulatory Visit: Payer: Self-pay | Admitting: Cardiology

## 2015-12-15 ENCOUNTER — Ambulatory Visit (INDEPENDENT_AMBULATORY_CARE_PROVIDER_SITE_OTHER): Payer: 59 | Admitting: Pharmacist

## 2015-12-15 DIAGNOSIS — E785 Hyperlipidemia, unspecified: Secondary | ICD-10-CM | POA: Diagnosis not present

## 2015-12-15 MED ORDER — ATORVASTATIN CALCIUM 80 MG PO TABS: 80.0000 mg | ORAL_TABLET | Freq: Every day | ORAL | 11 refills | Status: DC

## 2015-12-15 NOTE — Patient Instructions (Addendum)
Start taking 2 of your Lipitor (atorvastatin) 40mg  every day. When you run out, pick up your 80mg  dose and take 1 a day.  Continue taking your fenofibrate.  Switch to Monster sugar free energy drinks (white can instead of black/green can).  Recheck cholesterol in 6 months - in May 2018. Call [phone] for appointment.

## 2015-12-15 NOTE — Progress Notes (Signed)
Patient ID: [name]                 DOB: January 31, 1966                    MRN: [id number]     HPI: [name] is a 50 y.o. male patient of Dr [name] referred to lipid clinic by [name], PA. PMH is significant for chronic fatigue for over 3 years, HTN, HLD, CKD (solitary kidney - R kidney removed). He has previously been seen by lipid clinic for management of hypertriglyceridemia. Previous intolerances include Lipitor, fenofibrate, gemfibrozil, niacin, and fish oil. Patient reports fatigue with all of these medications although he still complains of fatigue once he discontinues therapy.  Language barrier makes communication somewhat difficult but patient does report adherence to his Lipitor and fenofibrate (although he has previously reported fatigue with both of these medicines). He also reports fatigue with gemfibrozil and fatigue/GI upset with fish oil. He has had chronic fatigue and this is his main complaint today.   He does not drink alcohol and eats minimal desserts. Does eat bread but states not in excess. He has been drinking energy drinks on a regular basis to help with his fatigue. He drinks a regular Monster most days a week. Each can has close to 30g of sugar in it.  Current Medications: Lipitor 40mg  daily, fenofibrate 160mg  daily Intolerances: Lipitor, fenofibrate, gemfibrozil, niacin, fish oil - reported fatigue with all. GI upset with fish oil too. Risk Factors: family history of CAD, low HDL, metabolic syndrome  TG goal: 150mg /dL  Diet: Beef and lamb, vegetables, bread. Limits desserts. No alcohol. Drinks coffee, tea, and water.  Exercise: Works 7am-7pm and feels fatigued frequently so he does not get any additional activity.  Family History: Brother with hx of CABG at age 44, mother with hx of CAD and HTN, father and 2 sisters with HLD.  Social History: Denies alcohol and tobacco use. Moved to the [place] from [place] in 2002.  Labs: 12/09/15: TC 208, TG 446, HDL 39, LDL not  calculable (Lipitor 40mg  daily, fenofibrate 160mg  daily) 05/06/2014: TC 255, TG 283, HDL 39.9, LDL-D 139 (Lipitor 20mg  daily, fenofibrate 160mg  daily) 12/26/2013: TC 339, TG 1260, HDL 23.1, LDL not calculable (no medications)  Past Medical History:  Diagnosis Date  . Anemia   . Back pain   . Colon polyps    adenomatous  . Elevated cholesterol   . Hyperlipidemia   . Hypertension    no current bp meds for last 3 months  . Kidney stones 2007  . Otosclerosis of both ears   . PUD (peptic ulcer disease)    Has had unspecified surgery for this  . Sleep apnea     has appt on 09-22-15 to get for CPAP    Current Outpatient Prescriptions on File Prior to Visit  Medication Sig Dispense Refill  . amLODipine (NORVASC) 5 MG tablet Take 1 tablet (5 mg total) by mouth daily. APPOINTMENT IS NEEDED 90 tablet 3  . atorvastatin (LIPITOR) 40 MG tablet Take 1 tablet (40 mg total) by mouth daily. 90 tablet 3  . fenofibrate 160 MG tablet Take 1 tablet (160 mg total) by mouth daily. Take with your largest meal. 90 tablet 3  . omeprazole (PRILOSEC) 20 MG capsule Take 20 mg by mouth daily.      No current facility-administered medications on file prior to visit.     Allergies  Allergen Reactions  . Beef-Derived Products  Cultural preference  . Pegademase Bovine Other (See Comments)    Cultural preference  . Poractant Alfa Other (See Comments)    Cultural preference  . Pork-Derived Products     Cultural preference    Assessment/Plan:  1. Hypertriglyceridemia - TG uncontrolled at 446mg /dL on Lipitor 40mg  daily and fenofibrate 160mg  daily. Patient's baseline TG is above 1,000. He is intolerant to fish oil (fatigue and GI upset) and is unwilling to rechallenge with this. He agrees to increase Lipitor to 80mg  daily. Overall diet is healthy but pt has been drinking an energy drink almost daily to help with his fatigue. Advised him to switch to the sugar free version to help with his TG. Will plan to  recheck lipid panel and direct LDL in 6 months. Pt will call to make lab appt closer to May 2018 pending his work schedule.   Megan E. Supple, PharmD, [facility] [address], [place], [place] [address] Phone: [phone]; Fax: [phone] 12/15/2015 4:08 PM

## 2015-12-28 ENCOUNTER — Ambulatory Visit (HOSPITAL_COMMUNITY): Payer: 59 | Attending: Physician Assistant

## 2015-12-28 ENCOUNTER — Other Ambulatory Visit: Payer: Self-pay

## 2015-12-28 DIAGNOSIS — R5383 Other fatigue: Secondary | ICD-10-CM

## 2015-12-28 DIAGNOSIS — R0602 Shortness of breath: Secondary | ICD-10-CM | POA: Diagnosis not present

## 2015-12-28 DIAGNOSIS — I071 Rheumatic tricuspid insufficiency: Secondary | ICD-10-CM | POA: Insufficient documentation

## 2015-12-29 ENCOUNTER — Telehealth: Payer: Self-pay | Admitting: *Deleted

## 2015-12-29 ENCOUNTER — Telehealth: Payer: Self-pay | Admitting: Cardiology

## 2015-12-29 ENCOUNTER — Encounter: Payer: Self-pay | Admitting: Physician Assistant

## 2015-12-29 NOTE — Telephone Encounter (Signed)
Lmtcb to go over echo results.  

## 2015-12-29 NOTE — Telephone Encounter (Signed)
Pt notified of echo results and findings by phone with verbal understanding.

## 2015-12-29 NOTE — Telephone Encounter (Signed)
Pt notified of test results

## 2015-12-29 NOTE — Telephone Encounter (Signed)
New message ° °Pt is returning call  ° °Please call back °

## 2015-12-30 ENCOUNTER — Encounter (HOSPITAL_COMMUNITY): Payer: Self-pay | Admitting: Emergency Medicine

## 2015-12-30 ENCOUNTER — Emergency Department (HOSPITAL_COMMUNITY): Payer: 59

## 2015-12-30 ENCOUNTER — Emergency Department (HOSPITAL_COMMUNITY)
Admission: EM | Admit: 2015-12-30 | Discharge: 2015-12-30 | Disposition: A | Discharge status: Home / Self Care | Payer: 59 | Attending: Emergency Medicine | Admitting: Emergency Medicine

## 2015-12-30 DIAGNOSIS — S8992XA Unspecified injury of left lower leg, initial encounter: Secondary | ICD-10-CM | POA: Insufficient documentation

## 2015-12-30 DIAGNOSIS — Y999 Unspecified external cause status: Secondary | ICD-10-CM | POA: Diagnosis not present

## 2015-12-30 DIAGNOSIS — I10 Essential (primary) hypertension: Secondary | ICD-10-CM | POA: Insufficient documentation

## 2015-12-30 DIAGNOSIS — S8991XA Unspecified injury of right lower leg, initial encounter: Secondary | ICD-10-CM | POA: Insufficient documentation

## 2015-12-30 DIAGNOSIS — Y939 Activity, unspecified: Secondary | ICD-10-CM | POA: Insufficient documentation

## 2015-12-30 DIAGNOSIS — M79661 Pain in right lower leg: Secondary | ICD-10-CM

## 2015-12-30 DIAGNOSIS — M545 Low back pain, unspecified: Secondary | ICD-10-CM

## 2015-12-30 DIAGNOSIS — M25572 Pain in left ankle and joints of left foot: Secondary | ICD-10-CM | POA: Diagnosis not present

## 2015-12-30 DIAGNOSIS — M79662 Pain in left lower leg: Secondary | ICD-10-CM

## 2015-12-30 DIAGNOSIS — Y92410 Unspecified street and highway as the place of occurrence of the external cause: Secondary | ICD-10-CM | POA: Diagnosis not present

## 2015-12-30 DIAGNOSIS — Z79899 Other long term (current) drug therapy: Secondary | ICD-10-CM | POA: Insufficient documentation

## 2015-12-30 MED ORDER — ACETAMINOPHEN 500 MG PO TABS: 500.0000 mg | ORAL_TABLET | Freq: Four times a day (QID) | ORAL | 0 refills | Status: DC | PRN

## 2015-12-30 MED ORDER — ACETAMINOPHEN 325 MG PO TABS
650.0000 mg | ORAL_TABLET | Freq: Once | ORAL | Status: AC
  Administered 2015-12-30: 650 mg via ORAL
  Filled 2015-12-30: qty 2

## 2015-12-30 MED ORDER — METHOCARBAMOL 500 MG PO TABS: 500.0000 mg | ORAL_TABLET | Freq: Two times a day (BID) | ORAL | 0 refills | Status: DC | PRN
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
## 2015-12-30 NOTE — ED Notes (Signed)
Patient transported to X-ray 

## 2016-01-11 ENCOUNTER — Encounter: Payer: Self-pay | Admitting: Cardiology

## 2016-01-11 ENCOUNTER — Ambulatory Visit (INDEPENDENT_AMBULATORY_CARE_PROVIDER_SITE_OTHER): Payer: 59 | Admitting: Cardiology

## 2016-01-11 VITALS — BP 130/70 | HR 84 | Ht 69.0 in | Wt 257.2 lb

## 2016-01-11 DIAGNOSIS — I10 Essential (primary) hypertension: Secondary | ICD-10-CM

## 2016-01-11 NOTE — Patient Instructions (Addendum)
Medication Instructions:  Your physician recommends that you continue on your current medications as directed. Please refer to the Current Medication list given to you today.   Labwork: None ordered  Testing/Procedures: None ordered  Follow-Up: Your physician wants you to follow-up in: [place] DR. Johann Capers will receive a reminder letter in the mail two months in advance. If you don't receive a letter, please call our office to schedule the follow-up appointment.    Any Other Special Instructions Will Be Listed Below (If Applicable).  You need to follow-up with your Sleep Dr.   If you need a refill on your cardiac medications before your next appointment, please call your pharmacy.

## 2016-01-11 NOTE — Progress Notes (Signed)
01/11/2016 Johnnette Litter   12/22/1965  [id number]  Primary Physician No PCP Per Patient Primary Cardiologist: Dr. [name]    Reason for Visit/CC: f/u for Fatigue and HTN  HPI:  The patient is a 50 y/o [place] male with a hx of Neuropathy, chronic fatigue, peptic ulcer disease status post prior gastric surgery in [place], HTN, HL, CKD (solitary kidney - R kidney removed). Myoview in 3/15 was low risk and negative for ischemia. He is followed by Dr. [name]. He was seen recently by [name], PA-C, on 12/09/15 and complained of fatigue. He also complained of LEE. Labs were obtained including a BNP, CMP, CBC and TSH. BNP was normal, as well as CBC. CMP showed stable renal insufficieny with SCr at 1.53. 2D echo revealed normal LVEF. He was advised to f/u with his sleep doctor. There was also ? As to whether or not his amlodipine was contributing to his edema. Plan was to consider switch to either a thiazide diuretic or ACE/ARB.   Today in clinic, his BP is well controlled at 130/70. He denies CP and dyspnea. Still with 1+ bilateral LEE. His main compliant is chronic fatigue. He states that he is no longer using his CPAP.    Current Meds  Medication Sig  . acetaminophen (TYLENOL) 500 MG tablet Take 1 tablet (500 mg total) by mouth every 6 (six) hours as needed for mild pain or moderate pain.  Marland Kitchen amLODipine (NORVASC) 5 MG tablet Take 1 tablet (5 mg total) by mouth daily. APPOINTMENT IS NEEDED  . atorvastatin (LIPITOR) 80 MG tablet TAKE 1 TABLET BY MOUTH DAILY  . fenofibrate 160 MG tablet Take 1 tablet (160 mg total) by mouth daily. Take with your largest meal.  . methocarbamol (ROBAXIN) 500 MG tablet Take 1 tablet (500 mg total) by mouth 2 (two) times daily as needed for muscle spasms.  Marland Kitchen omeprazole (PRILOSEC) 20 MG capsule Take 20 mg by mouth daily.   . [DISCONTINUED] atorvastatin (LIPITOR) 40 MG tablet   . [DISCONTINUED] testosterone cypionate (DEPOTESTOSTERONE CYPIONATE) 200 MG/ML injection     Allergies  Allergen Reactions  . Beef-Derived Products     Cultural preference  . Pegademase Bovine Other (See Comments)    Cultural preference  . Poractant Alfa Other (See Comments)    Cultural preference  . Pork-Derived Products     Cultural preference   Past Medical History:  Diagnosis Date  . Anemia   . Back pain   . Colon polyps    adenomatous  . Elevated cholesterol   . History of echocardiogram    Echo 11/17: EF 65-70, normal wall motion, grade 1 diastolic dysfunction, trivial MR, mild LAE, mild TR  . Hyperlipidemia   . Hypertension    no current bp meds for last 3 months  . Kidney stones 2007  . Otosclerosis of both ears   . PUD (peptic ulcer disease)    Has had unspecified surgery for this  . Sleep apnea     has appt on 09-22-15 to get for CPAP   Family History  Problem Relation Age of Onset  . Hyperlipidemia Father   . Heart disease Mother   . Hypertension Mother   . Hypertension Sister   . Heart disease Brother 86    CABG  . Hyperlipidemia Sister   . Hyperlipidemia Sister   . Esophageal cancer Cousin   . Colon cancer Neg Hx   . Stomach cancer Neg Hx   . Rectal cancer Neg Hx  Past Surgical History:  Procedure Laterality Date  . NEPHRECTOMY  02/18/2011   Procedure: NEPHRECTOMY;  Surgeon: Hanley Ben, MD;  Location: [facility];  Service: Urology;  Laterality: Right;  . SMALL INTESTINE SURGERY    . STAPEDOTOMY  2005   lt ear jan, right ear sept  . surgery for ulcers  1990   Social History   Social History  . Marital status: Married    Spouse name: N/A  . Number of children: 4  . Years of education: N/A   Occupational History  .  [employer]   Social History Main Topics  . Smoking status: Never Smoker  . Smokeless tobacco: Never Used  . Alcohol use No  . Drug use: No  . Sexual activity: Not on file   Other Topics Concern  . Not on file   Social History Narrative   Lives at home with wife and family. He is from [place]. Came to the  [place] in 2002.     Review of Systems: General: negative for chills, fever, night sweats or weight changes.  Cardiovascular: negative for chest pain, dyspnea on exertion, edema, orthopnea, palpitations, paroxysmal nocturnal dyspnea or shortness of breath Dermatological: negative for rash Respiratory: negative for cough or wheezing Urologic: negative for hematuria Abdominal: negative for nausea, vomiting, diarrhea, bright red blood per rectum, melena, or hematemesis Neurologic: negative for visual changes, syncope, or dizziness All other systems reviewed and are otherwise negative except as noted above.   Physical Exam:  Blood pressure 130/70, pulse 84, height 5\' 9"  (1.753 m), weight 257 lb 4 oz (116.7 kg).  General appearance: alert, cooperative and no distress Neck: no carotid bruit and no JVD Lungs: clear to auscultation bilaterally Heart: regular rate and rhythm, S1, S2 normal, no murmur, click, rub or gallop Extremities: 1+ bilateral LE Pulses: 2+ and symmetric Skin: Skin color, texture, turgor normal. No rashes or lesions Neurologic: Grossly normal  EKG Not performed   ASSESSMENT AND PLAN:   1. HTN: controlled on amlodipine. He has mild bilateral LEE, which is likely from amlodipine given normal BNP. We discussed other options, but given his solitary kidney with chronic renal insufficiency with baseline SCr ~1.4-1.5, we will avoid change to diuretics, ACE/ARBs to prevent worsening renal insufficiency. Will stick with amlodipine for now. If swelling worsens of if he cannot tolerate, can later switch to Coreg.   2. Fatigue: TSH and CBC normal. 2D echo also with normal LVF. Patient stopped use of CPAP. He was notified that untreated OSA can be a cause of fatigue. He was advised to f/u with his sleep medicine doctor for further management.   3. OSA: per above.   PLAN  F/u with Dr. [name] in 6 months.    Lyda Jester PA-C 01/11/2016 3:47 PM

## 2016-02-10 ENCOUNTER — Other Ambulatory Visit: Payer: Self-pay | Admitting: Cardiology

## 2016-02-11 NOTE — Telephone Encounter (Signed)
amLODipine (NORVASC) 5 MG tablet  Medication  Date: 12/09/2015 Department: [address] Office Ordering/Authorizing: [name], MD  Order Providers   Prescribing Provider Encounter Provider  [name], MD Liliane Shi, PA-C  Supervision Information   Supervising Provider Type of Supervision  Jerline Pain, MD Incident To  Medication Detail    Disp Refills Start End   amLODipine (NORVASC) 5 MG tablet 90 tablet 3 12/09/2015    Sig - Route: Take 1 tablet (5 mg total) by mouth daily. APPOINTMENT IS NEEDED - Oral   E-Prescribing Status: Receipt confirmed by pharmacy (12/09/2015 12:56 PM EDT)   Pharmacy   [id number] - [address], [place]

## 2016-04-04 ENCOUNTER — Ambulatory Visit (INDEPENDENT_AMBULATORY_CARE_PROVIDER_SITE_OTHER): Payer: 59 | Admitting: Family Medicine

## 2016-04-04 VITALS — BP 142/90 | HR 78 | Temp 98.4°F | Resp 18 | Ht 69.0 in | Wt 251.8 lb

## 2016-04-04 DIAGNOSIS — Z0000 Encounter for general adult medical examination without abnormal findings: Secondary | ICD-10-CM

## 2016-04-04 DIAGNOSIS — I10 Essential (primary) hypertension: Secondary | ICD-10-CM | POA: Diagnosis not present

## 2016-04-04 DIAGNOSIS — Z6837 Body mass index (BMI) 37.0-37.9, adult: Secondary | ICD-10-CM

## 2016-04-04 DIAGNOSIS — E6609 Other obesity due to excess calories: Secondary | ICD-10-CM

## 2016-04-04 DIAGNOSIS — Z114 Encounter for screening for human immunodeficiency virus [HIV]: Secondary | ICD-10-CM

## 2016-04-04 DIAGNOSIS — Z125 Encounter for screening for malignant neoplasm of prostate: Secondary | ICD-10-CM

## 2016-04-04 DIAGNOSIS — IMO0001 Reserved for inherently not codable concepts without codable children: Secondary | ICD-10-CM

## 2016-04-04 DIAGNOSIS — Z131 Encounter for screening for diabetes mellitus: Secondary | ICD-10-CM

## 2016-04-04 MED ORDER — LORATADINE 10 MG PO TABS: 10.0000 mg | ORAL_TABLET | Freq: Every day | ORAL | 11 refills | Status: DC

## 2016-04-04 NOTE — Patient Instructions (Addendum)
   IF you received an x-ray today, you will receive an invoice from [facility]. Please contact [facility] at [phone] with questions or concerns regarding your invoice.   IF you received labwork today, you will receive an invoice from [facility]. Please contact [facility] at [phone] with questions or concerns regarding your invoice.   Our billing staff will not be able to assist you with questions regarding bills from these companies.  You will be contacted with the lab results as soon as they are available. The fastest way to get your results is to activate your My Chart account. Instructions are located on the last page of this paperwork. If you have not heard from us regarding the results in 2 weeks, please contact this office.      Managing Your Hypertension Hypertension is commonly called high blood pressure. Blood pressure is a measurement of how strongly your blood is pressing against the walls of your arteries. Arteries are blood vessels that carry blood from your heart throughout your body. Blood pressure does not stay the same. It rises when you are active, excited, or nervous. It lowers when you are sleeping or relaxed. If the numbers that measure your blood pressure stay above normal most of the time, you are at risk for health problems. Hypertension is a long-term (chronic) condition in which blood pressure is elevated. This condition often has no signs or symptoms. The cause of the condition is usually not known. What are blood pressure readings? A blood pressure reading is recorded as two numbers, such as "120 over 80" (or 120/80). The first ("top") number is called the systolic pressure. It is a measure of the pressure in your arteries as the heart beats. The second ("bottom") number is called the diastolic pressure. It is a measure of the pressure in your arteries as the heart relaxes between beats. What does my blood pressure reading mean? Blood  pressure is classified into four stages. Based on your blood pressure reading, your health care provider may use the following stages to determine what type of treatment, if any, is needed. Systolic pressure and diastolic pressure are measured in a unit called mm Hg. Normal  Systolic pressure: below 120.  Diastolic pressure: below 80. Prehypertension  Systolic pressure: 120-139.  Diastolic pressure: 80-89. Hypertension stage 1  Systolic pressure: 140-159.  Diastolic pressure: 90-99. Hypertension stage 2  Systolic pressure: 160 or above.  Diastolic pressure: 100 or above. What health risks are associated with hypertension? Managing your hypertension is an important responsibility. Uncontrolled hypertension can lead to:  A heart attack.  A stroke.  A weakened blood vessel (aneurysm).  Heart failure.  Kidney damage.  Eye damage.  Metabolic syndrome.  Memory and concentration problems. What changes can I make to manage my hypertension? Hypertension can be managed effectively by making lifestyle changes and possibly by taking medicines. Your health care provider will help you come up with a plan to bring your blood pressure within a normal range. Your plan should include the following: Monitoring  Monitor your blood pressure at home as told by your health care provider. Your personal target blood pressure may vary depending on your medical conditions, your age, and other factors.  Have your blood pressure rechecked as told by your health care provider. Lifestyle  Lose weight if necessary.  Get at least 30-45 minutes of aerobic exercise at least 4 times a week.  Do not use any products that contain nicotine or tobacco, such as cigarettes and   e-cigarettes. If you need help quitting, ask your health care provider.  Learn ways to reduce stress.  Control any chronic conditions, such as high cholesterol or diabetes. Eating and drinking  Follow the DASH diet. This diet  is high in fruits, vegetables, and whole grains. It is low in salt, red meat, and added sugars.  Keep your sodium intake below 2,300 mg per day.  Limit alcoholic beverages. Communication  Review all the medicines you take with your health care provider because there may be side effects or interactions.  Talk with your health care provider about your diet, exercise habits, and other lifestyle factors that may be contributing to hypertension.  See your health care provider regularly. Your health care provider can help you create and adjust your plan for managing hypertension. Will I need medicine to control my blood pressure? Your health care provider may prescribe medicine if lifestyle changes are not enough to get your blood pressure under control, and if one of the following is true:  You are 18-59 years of age, and your systolic blood pressure is 140 or higher.  You are 60 years of age or older, and your systolic blood pressure is 150 or higher.  Your diastolic blood pressure is 90 or higher.  You have diabetes, and your systolic blood pressure is over 140 or your diastolic blood pressure is over 90.  You have kidney disease, and your blood pressure is above 140/90.  You have heart disease or a history of stroke, and your blood pressure is 140/90 or higher. Take medicines only as told by your health care provider. Follow the directions carefully. Blood pressure medicines must be taken as prescribed. The medicine does not work as well when you skip doses. Skipping doses also puts you at risk for problems. Contact a health care provider if:  You think you are having a reaction to medicines you have taken.  You have repeated (recurrent) headaches.  You feel dizzy.  You have swelling in your ankles.  You have trouble with your vision. Get help right away if:  You develop a severe headache or confusion.  You have unusual weakness or numbness, or you feel faint.  You have  severe pain in your chest or abdomen.  You vomit repeatedly.  You have trouble breathing. This information is not intended to replace advice given to you by your health care provider. Make sure you discuss any questions you have with your health care provider. Document Released: 10/26/2011 Document Revised: 10/06/2015 Document Reviewed: 05/01/2015 Elsevier Interactive Patient Education  2017 Elsevier Inc.  

## 2016-04-04 NOTE — Progress Notes (Addendum)
Chief Complaint  Patient presents with  . Annual Exam    Subjective:  [name] is a 51 y.o. male here for a health maintenance visit.  Patient is established pt  Pt reports that he has difficulty losing weight He has been having fatigue when he eats carbs and sugars He denies family history of diabetes He stopped drinking sodas Wt Readings from Last 3 Encounters:  04/04/16 251 lb 12.8 oz (114.2 kg)  01/11/16 257 lb 4 oz (116.7 kg)  12/30/15 240 lb (108.9 kg)     Patient Active Problem List   Diagnosis Date Noted  . Testicular hypofunction 09/15/2015  . BPH without obstruction/lower urinary tract symptoms 09/15/2015  . Chest pain 03/29/2013  . Hyperlipidemia 03/29/2013  . Nephrolithiasis 03/14/2013  . Acute blood loss anemia 03/14/2013  . Lumbar radiculopathy 02/26/2013  . Dyspnea 11/25/2011  . Leg pain, left 11/25/2011  . Vitamin D deficiency 08/03/2011  . Constipation 04/26/2011  . HTN (hypertension) 04/10/2010  . HLD (hyperlipidemia) 04/10/2010    Past Medical History:  Diagnosis Date  . Anemia   . Back pain   . Colon polyps    adenomatous  . Elevated cholesterol   . History of echocardiogram    Echo 11/17: EF 65-70, normal wall motion, grade 1 diastolic dysfunction, trivial MR, mild LAE, mild TR  . Hyperlipidemia   . Hypertension    no current bp meds for last 3 months  . Kidney stones 2007  . Otosclerosis of both ears   . PUD (peptic ulcer disease)    Has had unspecified surgery for this  . Sleep apnea     has appt on 09-22-15 to get for CPAP    Past Surgical History:  Procedure Laterality Date  . NEPHRECTOMY  02/18/2011   Procedure: NEPHRECTOMY;  Surgeon: Hanley Ben, MD;  Location: [facility];  Service: Urology;  Laterality: Right;  . SMALL INTESTINE SURGERY    . STAPEDOTOMY  2005   lt ear jan, right ear sept  . surgery for ulcers  1990     Outpatient Medications Prior to Visit  Medication Sig Dispense Refill  . amLODipine (NORVASC) 5 MG  tablet Take 1 tablet (5 mg total) by mouth daily. APPOINTMENT IS NEEDED (Patient not taking: Reported on 04/04/2016) 90 tablet 3  . atorvastatin (LIPITOR) 80 MG tablet TAKE 1 TABLET BY MOUTH DAILY (Patient not taking: Reported on 04/04/2016) 90 tablet 3  . fenofibrate 160 MG tablet Take 1 tablet (160 mg total) by mouth daily. Take with your largest meal. (Patient not taking: Reported on 04/04/2016) 90 tablet 3  . omeprazole (PRILOSEC) 20 MG capsule Take 20 mg by mouth daily.     Marland Kitchen acetaminophen (TYLENOL) 500 MG tablet Take 1 tablet (500 mg total) by mouth every 6 (six) hours as needed for mild pain or moderate pain. 30 tablet 0  . methocarbamol (ROBAXIN) 500 MG tablet Take 1 tablet (500 mg total) by mouth 2 (two) times daily as needed for muscle spasms. 20 tablet 0   No facility-administered medications prior to visit.     Allergies  Allergen Reactions  . Beef-Derived Products     Cultural preference  . Pegademase Bovine Other (See Comments)    Cultural preference  . Poractant Alfa Other (See Comments)    Cultural preference  . Pork-Derived Products     Cultural preference     Family History  Problem Relation Age of Onset  . Hyperlipidemia Father   . Heart disease Mother   .  Hypertension Mother   . Hypertension Sister   . Heart disease Brother 83    CABG  . Hyperlipidemia Sister   . Hyperlipidemia Sister   . Esophageal cancer Cousin   . Colon cancer Neg Hx   . Stomach cancer Neg Hx   . Rectal cancer Neg Hx      Health Habits: Dental Exam: not up to date Eye Exam: up to date Exercise: 5 times/week on average Current exercise activities: walking/ lifting weights Diet: balanced  Social History   Social History  . Marital status: Married    Spouse name: N/A  . Number of children: 4  . Years of education: N/A   Occupational History  .  [employer]   Social History Main Topics  . Smoking status: Never Smoker  . Smokeless tobacco: Never Used  . Alcohol use No    . Drug use: No  . Sexual activity: Not on file   Other Topics Concern  . Not on file   Social History Narrative   Lives at home with wife and family. He is from [place]. Came to the [place] in 2002.   History  Alcohol Use No   History  Smoking Status  . Never Smoker  Smokeless Tobacco  . Never Used   History  Drug Use No      Health Maintenance: See under health Maintenance activity for review of completion dates as well. Immunization History  Administered Date(s) Administered  . Tdap 01/22/2010      Depression Screen-PHQ2/9 Depression screen [facility] 2/9 04/04/2016 09/18/2014  Decreased Interest 0 0  Down, Depressed, Hopeless 0 0  PHQ - 2 Score 0 0      Depression Severity and Treatment Recommendations:  0-4= None  5-9= Mild / Treatment: Support, educate to call if worse; return in one month  10-14= Moderate / Treatment: Support, watchful waiting; Antidepressant or Psycotherapy  15-19= Moderately severe / Treatment: Antidepressant OR Psychotherapy  >= 20 = Major depression, severe / Antidepressant AND Psychotherapy    Review of Systems   Review of Systems  Constitutional: Positive for diaphoresis.  Cardiovascular: Positive for leg swelling.  Musculoskeletal: Positive for back pain, falls, joint pain, myalgias and neck pain.    See HPI for ROS as well.    Objective:   Vitals:   04/04/16 1235  BP: (!) 142/90  Pulse: 78  Resp: 18  Temp: 98.4 F (36.9 C)  TempSrc: Oral  SpO2: 98%  Weight: 251 lb 12.8 oz (114.2 kg)  Height: 5\' 9"  (1.753 m)    Body mass index is 37.18 kg/m.  Physical Exam  Constitutional: He is oriented to person, place, and time. He appears well-developed and well-nourished. No distress.  HENT:  Head: Normocephalic and atraumatic.  Right Ear: External ear normal.  Left Ear: External ear normal.  Nose: Nose normal.  Mouth/Throat: Oropharynx is clear and moist. No oropharyngeal exudate.  Eyes: Conjunctivae and EOM are normal. Pupils  are equal, round, and reactive to light. Right eye exhibits no discharge. Left eye exhibits no discharge.  Neck: Normal range of motion. Neck supple. No thyromegaly present.  Cardiovascular: Normal rate, regular rhythm, normal heart sounds and intact distal pulses.   No murmur heard. Pulmonary/Chest: Effort normal and breath sounds normal. No respiratory distress. He has no wheezes. He has no rales. He exhibits no tenderness.  Abdominal: He exhibits no distension and no mass. There is no tenderness. There is no rebound and no guarding.  Midline and transverse scars  of the abdomen Obese abdomen  Musculoskeletal: Normal range of motion. He exhibits edema.  Neurological: He is alert and oriented to person, place, and time. He has normal reflexes.  Skin: Skin is warm. He is not diaphoretic. No erythema.  Psychiatric: He has a normal mood and affect. His behavior is normal. Judgment and thought content normal.       Assessment/Plan:   Patient was seen for a health maintenance exam.  Counseled the patient on health maintenance issues. Reviewed her health mainteance schedule and ordered appropriate tests (see orders.) Counseled on regular exercise and weight management. Recommend regular eye exams and dental cleaning.   The following issues were addressed today for health maintenance:   Charle was seen today for annual exam.  Diagnoses and all orders for this visit:  Annual physical exam- discussed age appropriate screenings Encouraged dental and vision exam -     CBC  Essential hypertension- bp mildly elevated Pt not compliant with bp meds -     Comprehensive metabolic panel -     Lipid panel -     Microalbumin, urine  Screening for prostate cancer -     PSA  Screening for diabetes mellitus- will screen with a1c today  Class 2 obesity due to excess calories with serious comorbidity and body mass index (BMI) of 37.0 to 37.9 in adult- will check for hormonal changes -     Hemoglobin  A1c -     TSH  Screening for HIV (human immunodeficiency virus)- verbal consent given -     HIV antibody    No Follow-up on file.    Body mass index is 37.18 kg/m.:  Discussed the patient's BMI with patient. The BMI body mass index is 37.18 kg/m.     Future Appointments Date Time Provider [place]  05/11/2016 2:00 PM Forrest Moron, MD PCP-PCP [facility]    Patient Instructions       IF you received an x-ray today, you will receive an invoice from [facility]. Please contact [facility] at [phone] with questions or concerns regarding your invoice.   IF you received labwork today, you will receive an invoice from [facility]. Please contact [facility] at [phone] with questions or concerns regarding your invoice.   Our billing staff will not be able to assist you with questions regarding bills from these companies.  You will be contacted with the lab results as soon as they are available. The fastest way to get your results is to activate your My Chart account. Instructions are located on the last page of this paperwork. If you have not heard from [place] regarding the results in 2 weeks, please contact this office.      Managing Your Hypertension Hypertension is commonly called high blood pressure. Blood pressure is a measurement of how strongly your blood is pressing against the walls of your arteries. Arteries are blood vessels that carry blood from your heart throughout your body. Blood pressure does not stay the same. It rises when you are active, excited, or nervous. It lowers when you are sleeping or relaxed. If the numbers that measure your blood pressure stay above normal most of the time, you are at risk for health problems. Hypertension is a long-term (chronic) condition in which blood pressure is elevated. This condition often has no signs or symptoms. The cause of the condition is usually not known. What are blood pressure readings? A blood  pressure reading is recorded as two numbers, such as "120 over  80" (or 120/80). The first ("top") number is called the systolic pressure. It is a measure of the pressure in your arteries as the heart beats. The second ("bottom") number is called the diastolic pressure. It is a measure of the pressure in your arteries as the heart relaxes between beats. What does my blood pressure reading mean? Blood pressure is classified into four stages. Based on your blood pressure reading, your health care provider may use the following stages to determine what type of treatment, if any, is needed. Systolic pressure and diastolic pressure are measured in a unit called mm Hg. Normal  Systolic pressure: below 629.  Diastolic pressure: below 80. Prehypertension  Systolic pressure: 476-546.  Diastolic pressure: 50-35. Hypertension stage 1  Systolic pressure: 465-681.  Diastolic pressure: 27-51. Hypertension stage 2  Systolic pressure: 700 or above.  Diastolic pressure: 174 or above. What health risks are associated with hypertension? Managing your hypertension is an important responsibility. Uncontrolled hypertension can lead to:  A heart attack.  A stroke.  A weakened blood vessel (aneurysm).  Heart failure.  Kidney damage.  Eye damage.  Metabolic syndrome.  Memory and concentration problems. What changes can I make to manage my hypertension? Hypertension can be managed effectively by making lifestyle changes and possibly by taking medicines. Your health care provider will help you come up with a plan to bring your blood pressure within a normal range. Your plan should include the following: Monitoring  Monitor your blood pressure at home as told by your health care provider. Your personal target blood pressure may vary depending on your medical conditions, your age, and other factors.  Have your blood pressure rechecked as told by your health care provider. Lifestyle  Lose weight  if necessary.  Get at least 30-45 minutes of aerobic exercise at least 4 times a week.  Do not use any products that contain nicotine or tobacco, such as cigarettes and e-cigarettes. If you need help quitting, ask your health care provider.  Learn ways to reduce stress.  Control any chronic conditions, such as high cholesterol or diabetes. Eating and drinking  Follow the DASH diet. This diet is high in fruits, vegetables, and whole grains. It is low in salt, red meat, and added sugars.  Keep your sodium intake below 2,300 mg per day.  Limit alcoholic beverages. Communication  Review all the medicines you take with your health care provider because there may be side effects or interactions.  Talk with your health care provider about your diet, exercise habits, and other lifestyle factors that may be contributing to hypertension.  See your health care provider regularly. Your health care provider can help you create and adjust your plan for managing hypertension. Will I need medicine to control my blood pressure? Your health care provider may prescribe medicine if lifestyle changes are not enough to get your blood pressure under control, and if one of the following is true:  You are 25-78 years of age, and your systolic blood pressure is 140 or higher.  You are 67 years of age or older, and your systolic blood pressure is 150 or higher.  Your diastolic blood pressure is 90 or higher.  You have diabetes, and your systolic blood pressure is over 944 or your diastolic blood pressure is over 90.  You have kidney disease, and your blood pressure is above 140/90.  You have heart disease or a history of stroke, and your blood pressure is 140/90 or higher. Take medicines only as told  by your health care provider. Follow the directions carefully. Blood pressure medicines must be taken as prescribed. The medicine does not work as well when you skip doses. Skipping doses also puts you at risk  for problems. Contact a health care provider if:  You think you are having a reaction to medicines you have taken.  You have repeated (recurrent) headaches.  You feel dizzy.  You have swelling in your ankles.  You have trouble with your vision. Get help right away if:  You develop a severe headache or confusion.  You have unusual weakness or numbness, or you feel faint.  You have severe pain in your chest or abdomen.  You vomit repeatedly.  You have trouble breathing. This information is not intended to replace advice given to you by your health care provider. Make sure you discuss any questions you have with your health care provider. Document Released: 10/26/2011 Document Revised: 10/06/2015 Document Reviewed: 05/01/2015 Elsevier Interactive Patient Education  2017 [employer].

## 2016-04-05 LAB — PSA: Prostate Specific Ag, Serum: 1.3 ng/mL (ref 0.0–4.0)

## 2016-04-05 LAB — COMPREHENSIVE METABOLIC PANEL
ALBUMIN: 4.2 g/dL (ref 3.5–5.5)
ALK PHOS: 59 IU/L (ref 39–117)
ALT: 21 IU/L (ref 0–44)
AST: 21 IU/L (ref 0–40)
Albumin/Globulin Ratio: 1.8 (ref 1.2–2.2)
BUN / CREAT RATIO: 20 (ref 9–20)
BUN: 26 mg/dL — AB (ref 6–24)
Bilirubin Total: 0.4 mg/dL (ref 0.0–1.2)
CO2: 21 mmol/L (ref 18–29)
CREATININE: 1.33 mg/dL — AB (ref 0.76–1.27)
Calcium: 9.4 mg/dL (ref 8.7–10.2)
Chloride: 102 mmol/L (ref 96–106)
GFR calc Af Amer: 71 (ref >59)
GFR calc non Af Amer: 61 (ref >59)
GLUCOSE: 95 mg/dL (ref 65–99)
Globulin, Total: 2.3 (ref 1.5–4.5)
Potassium: 4.6 mmol/L (ref 3.5–5.2)
Sodium: 142 mmol/L (ref 134–144)
Total Protein: 6.5 g/dL (ref 6.0–8.5)

## 2016-04-05 LAB — LIPID PANEL
CHOLESTEROL TOTAL: 247 mg/dL — AB (ref 100–199)
Chol/HDL Ratio: 6.3 — ABNORMAL HIGH (ref 0.0–5.0)
HDL: 39 mg/dL — AB (ref >39)
TRIGLYCERIDES: 499 mg/dL — AB (ref 0–149)

## 2016-04-05 LAB — CBC
HEMOGLOBIN: 13.4 g/dL (ref 13.0–17.7)
Hematocrit: 41.1 % (ref 37.5–51.0)
MCH: 27.7 pg (ref 26.6–33.0)
MCHC: 32.6 g/dL (ref 31.5–35.7)
MCV: 85 fL (ref 79–97)
Platelets: 225 10*3/uL (ref 150–379)
RBC: 4.84 x10E6/uL (ref 4.14–5.80)
RDW: 14.1 % (ref 12.3–15.4)
WBC: 7.2 10*3/uL (ref 3.4–10.8)

## 2016-04-05 LAB — HEMOGLOBIN A1C
Est. average glucose Bld gHb Est-mCnc: 105
Hgb A1c MFr Bld: 5.3 % (ref 4.8–5.6)

## 2016-04-05 LAB — TSH: TSH: 0.882 u[IU]/mL (ref 0.450–4.500)

## 2016-04-05 LAB — MICROALBUMIN, URINE: Microalbumin, Urine: 5068 ug/mL

## 2016-04-05 LAB — HIV ANTIBODY (ROUTINE TESTING W REFLEX): HIV SCREEN 4TH GENERATION: NONREACTIVE

## 2016-05-03 ENCOUNTER — Telehealth: Payer: Self-pay | Admitting: Family Medicine

## 2016-05-03 NOTE — Telephone Encounter (Signed)
Unable to reach patient.

## 2016-05-11 ENCOUNTER — Ambulatory Visit (INDEPENDENT_AMBULATORY_CARE_PROVIDER_SITE_OTHER): Payer: 59

## 2016-05-11 ENCOUNTER — Ambulatory Visit (INDEPENDENT_AMBULATORY_CARE_PROVIDER_SITE_OTHER): Payer: 59 | Admitting: Family Medicine

## 2016-05-11 ENCOUNTER — Encounter: Payer: Self-pay | Admitting: Family Medicine

## 2016-05-11 VITALS — BP 142/92 | HR 89 | Temp 98.8°F | Resp 16 | Ht 69.0 in | Wt 248.0 lb

## 2016-05-11 DIAGNOSIS — N182 Chronic kidney disease, stage 2 (mild): Secondary | ICD-10-CM | POA: Diagnosis not present

## 2016-05-11 DIAGNOSIS — Z905 Acquired absence of kidney: Secondary | ICD-10-CM

## 2016-05-11 DIAGNOSIS — R809 Proteinuria, unspecified: Secondary | ICD-10-CM | POA: Diagnosis not present

## 2016-05-11 DIAGNOSIS — I10 Essential (primary) hypertension: Secondary | ICD-10-CM | POA: Diagnosis not present

## 2016-05-11 DIAGNOSIS — M25572 Pain in left ankle and joints of left foot: Secondary | ICD-10-CM

## 2016-05-11 DIAGNOSIS — E785 Hyperlipidemia, unspecified: Secondary | ICD-10-CM

## 2016-05-11 MED ORDER — FENOFIBRATE 160 MG PO TABS: 160.0000 mg | ORAL_TABLET | Freq: Every day | ORAL | 3 refills | Status: DC

## 2016-05-11 MED ORDER — ATORVASTATIN CALCIUM 80 MG PO TABS: 80.0000 mg | ORAL_TABLET | Freq: Every day | ORAL | 3 refills | Status: DC

## 2016-05-11 MED ORDER — AMLODIPINE BESYLATE 5 MG PO TABS: 5.0000 mg | ORAL_TABLET | Freq: Every day | ORAL | 3 refills | Status: DC

## 2016-05-11 MED ORDER — TRAMADOL HCL 50 MG PO TABS: 50.0000 mg | ORAL_TABLET | Freq: Three times a day (TID) | ORAL | 0 refills | Status: DC | PRN

## 2016-05-11 NOTE — Patient Instructions (Addendum)
Try aspercreme with lidocaine applied to the ankle  Stop drinking monster energy drinks. They raise your blood pressure  IF you received an x-ray today, you will receive an invoice from [facility]. Please contact [facility] at [phone] with questions or concerns regarding your invoice.   IF you received labwork today, you will receive an invoice from [facility]. Please contact [facility] at [phone] with questions or concerns regarding your invoice.   Our billing staff will not be able to assist you with questions regarding bills from these companies.  You will be contacted with the lab results as soon as they are available. The fastest way to get your results is to activate your My Chart account. Instructions are located on the last page of this paperwork. If you have not heard from [place] regarding the results in 2 weeks, please contact this office.

## 2016-05-11 NOTE — Progress Notes (Signed)
Chief Complaint  Patient presents with  . Follow-up    HPI   Dyslipidemia Pt reports that he has not been taking his statin medication He sees Cardiology He stopped his statin because he did not think it was helping him  Hypertension Pt reports that he has not taken any bp medications He drinks a MONSTER ENERGY DRINK every  Afternoon and wanted to know if he should continue doing this.  He denies HA, CP, SOB, LE EDEMA  Proteinuria He is here to discuss his urine microalbumin result He reports that he has one kidney that was removed by urology but otherwise has had no history of kidney disorder. He does not take any bp medications He denies LE edema Denies back pain  Left ankle pain Pt reports that his ankle has been hurting like the bone is pressing into the floor Lots of pressure and trace edema Denies recent injury Reports that he stands for along time at work It causes him to limp  Past Medical History:  Diagnosis Date  . Anemia   . Back pain   . Colon polyps    adenomatous  . Elevated cholesterol   . History of echocardiogram    Echo 11/17: EF 65-70, normal wall motion, grade 1 diastolic dysfunction, trivial MR, mild LAE, mild TR  . Hyperlipidemia   . Hypertension    no current bp meds for last 3 months  . Kidney stones 2007  . Otosclerosis of both ears   . PUD (peptic ulcer disease)    Has had unspecified surgery for this  . Sleep apnea     has appt on 09-22-15 to get for CPAP    Current Outpatient Prescriptions  Medication Sig Dispense Refill  . amLODipine (NORVASC) 5 MG tablet Take 1 tablet (5 mg total) by mouth daily. 90 tablet 3  . atorvastatin (LIPITOR) 80 MG tablet Take 1 tablet (80 mg total) by mouth daily. 90 tablet 3  . fenofibrate 160 MG tablet Take 1 tablet (160 mg total) by mouth daily. Take with your largest meal. 90 tablet 3  . loratadine (CLARITIN) 10 MG tablet Take 1 tablet (10 mg total) by mouth daily. (Patient not taking: Reported on  05/11/2016) 30 tablet 11  . omeprazole (PRILOSEC) 20 MG capsule Take 20 mg by mouth daily.      No current facility-administered medications for this visit.     Allergies:  Allergies  Allergen Reactions  . Beef-Derived Products     Cultural preference  . Pegademase Bovine Other (See Comments)    Cultural preference  . Poractant Alfa Other (See Comments)    Cultural preference  . Pork-Derived Products     Cultural preference    Past Surgical History:  Procedure Laterality Date  . NEPHRECTOMY  02/18/2011   Procedure: NEPHRECTOMY;  Surgeon: Hanley Ben, MD;  Location: [facility];  Service: Urology;  Laterality: Right;  . SMALL INTESTINE SURGERY    . STAPEDOTOMY  2005   lt ear jan, right ear sept  . surgery for ulcers  1990    Social History   Social History  . Marital status: Married    Spouse name: N/A  . Number of children: 4  . Years of education: N/A   Occupational History  .  [employer]   Social History Main Topics  . Smoking status: Never Smoker  . Smokeless tobacco: Never Used  . Alcohol use No  . Drug use: No  . Sexual activity: Not Asked  Other Topics Concern  . None   Social History Narrative   Lives at home with wife and family. He is from [place]. Came to the [place] in 2002.    Review of Systems  Constitutional: Negative for chills and fever.  Eyes: Negative for blurred vision and double vision.  Respiratory: Negative for cough, shortness of breath and wheezing.   Cardiovascular: Negative for chest pain, palpitations, leg swelling and PND.  Gastrointestinal: Negative for abdominal pain, nausea and vomiting.  Genitourinary: Negative for dysuria, flank pain, frequency, hematuria and urgency.  Musculoskeletal: Positive for joint pain. Negative for back pain and myalgias.  Skin: Negative for itching and rash.  Neurological: Negative for dizziness, tingling and headaches.  Psychiatric/Behavioral: Negative for depression. The patient is not  nervous/anxious and does not have insomnia.    See hpi  Objective: Vitals:   05/11/16 1410  BP: (!) 145/88  Pulse: 89  Resp: 16  Temp: 98.8 F (37.1 C)  TempSrc: Oral  SpO2: 97%  Weight: 248 lb (112.5 kg)  Height: 5' 9"  (1.753 m)   BP Readings from Last 3 Encounters:  05/11/16 (!) 145/88  04/04/16 (!) 142/90  01/11/16 130/70    Physical Exam  Constitutional: He appears well-developed and well-nourished.  HENT:  Head: Normocephalic and atraumatic.  Cardiovascular: Normal rate, regular rhythm and normal heart sounds.   Pulses:      Dorsalis pedis pulses are 2+ on the left side.       Posterior tibial pulses are 2+ on the left side.  Pulmonary/Chest: Effort normal and breath sounds normal. No respiratory distress. He has no wheezes. He has no rales.  Musculoskeletal:       Left ankle: He exhibits decreased range of motion. He exhibits no swelling and normal pulse. Tenderness. Medial malleolus tenderness found. No head of 5th metatarsal and no proximal fibula tenderness found. Achilles tendon exhibits no pain, no defect and normal Thompson's test results.       Left foot: There is normal range of motion and no deformity.  Feet:  Left Foot:  Skin Integrity: Negative for ulcer, blister, skin breakdown, erythema, warmth, callus or dry skin.     IMPRESSION: 1. Normal left renal size. No evidence of hydronephrosis nor discrete stones. Simple appearing 1 cm lower pole cyst. 2. The right kidney is surgically absent. 3. The partially distended urinary bladder is normal.   Electronically Signed   By: David  Martinique M.D.   On: 04/13/2015 14:44  CLINICAL DATA:  Left ankle pain.  EXAM: LEFT ANKLE COMPLETE - 3+ VIEW  COMPARISON:  12/30/2015  FINDINGS: There is no evidence of fracture, dislocation, or joint effusion. Focal lucency within the medial talar dome is again noted compatible with a chronic osteochondral lesion. Posterior calcaneal heel spur. Soft tissues are  unremarkable.  IMPRESSION: 1. No acute findings. 2. Chronic osteochondral lesion medial talar dome.   Electronically Signed   By: Kerby Moors M.D.   On: 05/11/2016 15:38  Assessment and Plan [name] was seen today for follow-up.  Diagnoses and all orders for this visit:  Proteinuria, unspecified type- with this severity of proteinuria will check for multiple myeloma -     Ambulatory referral to Nephrology -     Microalbumin, urine -     Multiple Myeloma Panel (SPEP&IFE w/QIG) -     CBC with Differential/Platelet  Stage 2 chronic kidney disease -    Pt with history of nephrectomy on the left, would like pt evaluated for kidney  disease of the remaining kidney -     Ambulatory referral to Nephrology -     VITAMIN D 25 Hydroxy (Vit-D Deficiency, Fractures)  S/p nephrectomy -     Ambulatory referral to Nephrology  Acute left ankle pain- based on [place] rules xrayed pt today Plan to refer to Podiatry Advised Tylenol for pain and aspercreme with lidocaine Chronic changes on xray -     Ambulatory referral to Podiatry  Dyslipidemia-  Advised pt to follow up with Cardiology Restart lipitor and  -     atorvastatin (LIPITOR) 80 MG tablet; Take 1 tablet (80 mg total) by mouth daily. -     fenofibrate 160 MG tablet; Take 1 tablet (160 mg total) by mouth daily. Take with your largest meal. -     amLODipine (NORVASC) 5 MG tablet; Take 1 tablet (5 mg total) by mouth daily.  Essential Hypertension -  Advised to stop drinking monster energy drinks Restart amlodipine  A total of 40 minutes were spent face-to-face with the patient during this encounter and over half of that time was spent on counseling and coordination of care.  [place]

## 2016-05-13 LAB — CBC WITH DIFFERENTIAL/PLATELET
Basophils Absolute: 0 10*3/uL (ref 0.0–0.2)
Basos: 0 %
EOS (ABSOLUTE): 0.1 10*3/uL (ref 0.0–0.4)
EOS: 2 %
HEMATOCRIT: 44 % (ref 37.5–51.0)
HEMOGLOBIN: 15.2 g/dL (ref 13.0–17.7)
IMMATURE GRANS (ABS): 0.1 10*3/uL (ref 0.0–0.1)
Immature Granulocytes: 1 %
LYMPHS ABS: 1.6 10*3/uL (ref 0.7–3.1)
Lymphs: 22 %
MCH: 28.8 pg (ref 26.6–33.0)
MCHC: 34.5 g/dL (ref 31.5–35.7)
MCV: 84 fL (ref 79–97)
MONOCYTES: 10 %
Monocytes Absolute: 0.7 10*3/uL (ref 0.1–0.9)
NEUTROS ABS: 4.5 10*3/uL (ref 1.4–7.0)
Neutrophils: 65 %
Platelets: 205 10*3/uL (ref 150–379)
RBC: 5.27 x10E6/uL (ref 4.14–5.80)
RDW: 14.2 % (ref 12.3–15.4)
WBC: 7 10*3/uL (ref 3.4–10.8)

## 2016-05-13 LAB — MULTIPLE MYELOMA PANEL, SERUM
ALBUMIN SERPL ELPH-MCNC: 3.3 g/dL (ref 2.9–4.4)
ALPHA 1: 0.2 g/dL (ref 0.0–0.4)
Albumin/Glob SerPl: 1.2 (ref 0.7–1.7)
Alpha2 Glob SerPl Elph-Mcnc: 1 g/dL (ref 0.4–1.0)
B-Globulin SerPl Elph-Mcnc: 1.1 g/dL (ref 0.7–1.3)
Gamma Glob SerPl Elph-Mcnc: 0.6 g/dL (ref 0.4–1.8)
Globulin, Total: 2.9 g/dL (ref 2.2–3.9)
IGA/IMMUNOGLOBULIN A, SERUM: 90 mg/dL (ref 90–386)
IgG (Immunoglobin G), Serum: 599 mg/dL — ABNORMAL LOW (ref 700–1600)
IgM (Immunoglobulin M), Srm: 38 mg/dL (ref 20–172)
TOTAL PROTEIN: 6.2 g/dL (ref 6.0–8.5)

## 2016-05-13 LAB — MICROALBUMIN, URINE: MICROALBUM., U, RANDOM: 3388 ug/mL

## 2016-05-13 LAB — VITAMIN D 25 HYDROXY (VIT D DEFICIENCY, FRACTURES): Vit D, 25-Hydroxy: 9.4 ng/mL — ABNORMAL LOW (ref 30.0–100.0)

## 2016-05-27 ENCOUNTER — Other Ambulatory Visit: Payer: Self-pay | Admitting: Family Medicine

## 2016-05-27 MED ORDER — VITAMIN D (ERGOCALCIFEROL) 1.25 MG (50000 UNIT) PO CAPS: 50000.0000 [IU] | ORAL_CAPSULE | ORAL | 0 refills | Status: DC

## 2016-05-27 NOTE — Progress Notes (Signed)
Vitamin D sent in

## 2016-05-28 ENCOUNTER — Telehealth: Payer: Self-pay | Admitting: Emergency Medicine

## 2016-05-28 NOTE — Telephone Encounter (Signed)
-----  Message from Forrest Moron, MD sent at 05/27/2016 10:47 AM EDT ----- Please let the patient know that the kidney doctor will contact him to check him out.  His protein in the urine was high which we discussed in the office.  His vitamin D is low. Vitamin D is processed by the kidneys.  The protein immuno test was normal so he does not have multiple myeloma.  He should take vitamin D 50000 units weekly.  This has been sent to his pharmacy.  He should take this for at least 3 months. I will send him a copy of the labs for his records.

## 2016-05-31 ENCOUNTER — Other Ambulatory Visit: Payer: Self-pay | Admitting: Emergency Medicine

## 2016-05-31 MED ORDER — VITAMIN D (ERGOCALCIFEROL) 1.25 MG (50000 UNIT) PO CAPS: 50000.0000 [IU] | ORAL_CAPSULE | ORAL | 0 refills | Status: DC

## 2016-09-23 ENCOUNTER — Ambulatory Visit (INDEPENDENT_AMBULATORY_CARE_PROVIDER_SITE_OTHER): Payer: 59 | Admitting: Family Medicine

## 2016-09-23 ENCOUNTER — Encounter: Payer: Self-pay | Admitting: Family Medicine

## 2016-09-23 VITALS — BP 139/76 | HR 89 | Temp 98.0°F | Resp 18 | Ht 70.08 in | Wt 252.0 lb

## 2016-09-23 DIAGNOSIS — N400 Enlarged prostate without lower urinary tract symptoms: Secondary | ICD-10-CM

## 2016-09-23 DIAGNOSIS — E559 Vitamin D deficiency, unspecified: Secondary | ICD-10-CM | POA: Diagnosis not present

## 2016-09-23 DIAGNOSIS — R5383 Other fatigue: Secondary | ICD-10-CM

## 2016-09-23 DIAGNOSIS — I10 Essential (primary) hypertension: Secondary | ICD-10-CM | POA: Diagnosis not present

## 2016-09-23 DIAGNOSIS — E291 Testicular hypofunction: Secondary | ICD-10-CM

## 2016-09-23 DIAGNOSIS — E782 Mixed hyperlipidemia: Secondary | ICD-10-CM | POA: Diagnosis not present

## 2016-09-23 DIAGNOSIS — R801 Persistent proteinuria, unspecified: Secondary | ICD-10-CM | POA: Diagnosis not present

## 2016-09-23 LAB — POCT URINALYSIS DIP (MANUAL ENTRY)
Bilirubin, UA: NEGATIVE
Glucose, UA: NEGATIVE mg/dL
Ketones, POC UA: NEGATIVE mg/dL
Leukocytes, UA: NEGATIVE
NITRITE UA: NEGATIVE
UROBILINOGEN UA: 0.2 U/dL
pH, UA: 5.5 (ref 5.0–8.0)

## 2016-09-23 MED ORDER — FUROSEMIDE 20 MG PO TABS: 20.0000 mg | ORAL_TABLET | Freq: Every day | ORAL | 2 refills | Status: DC

## 2016-09-23 NOTE — Progress Notes (Signed)
Subjective:    Patient ID: [name], male    DOB: 07/18/1965, 51 y.o.   MRN: [id number]  09/23/2016  Vitamin D ( 3 month follow-up)   HPI This 51 y.o. male presents for evaluation of hypertension, dyslipidemia, vitamin D deficiency, proteinuria. Last evaluated by Dr. Nolon Rod on 05/11/16.  Management changes made at last visit included referral to nephrology for proteinuria; labs also obtained; recommended follow-up with cardiology.    Vitamin D level low at visit in 04/2016; no evidence of multiple myeloma.  Rx for Vitamin D 50,000 IU weekly.  Recommend follow-up in three months.  Admits to fatigue.  +snores. Receiving testosterone from urology yet not helping with fatigue.  S/p sleep study in 2017; not wearing CPAP regularly; sleep study not in chart.   BP Readings from Last 3 Encounters:  09/23/16 139/76  05/11/16 (!) 142/92  04/04/16 (!) 142/90   Wt Readings from Last 3 Encounters:  09/23/16 252 lb (114.3 kg)  05/11/16 248 lb (112.5 kg)  04/04/16 251 lb 12.8 oz (114.2 kg)   Immunization History  Administered Date(s) Administered  . Tdap 01/22/2010    Review of Systems  Constitutional: Positive for fatigue. Negative for activity change, appetite change, chills, diaphoresis and fever.  Respiratory: Negative for cough and shortness of breath.   Cardiovascular: Negative for chest pain, palpitations and leg swelling.  Gastrointestinal: Negative for abdominal pain, diarrhea, nausea and vomiting.  Endocrine: Negative for cold intolerance, heat intolerance, polydipsia, polyphagia and polyuria.  Skin: Negative for color change, rash and wound.  Neurological: Negative for dizziness, tremors, seizures, syncope, facial asymmetry, speech difficulty, weakness, light-headedness, numbness and headaches.  Psychiatric/Behavioral: Negative for dysphoric mood and sleep disturbance. The patient is not nervous/anxious.     Past Medical History:  Diagnosis Date  . Anemia   . Back pain   .  Colon polyps    adenomatous  . Elevated cholesterol   . History of echocardiogram    Echo 11/17: EF 65-70, normal wall motion, grade 1 diastolic dysfunction, trivial MR, mild LAE, mild TR  . Hyperlipidemia   . Hypertension    no current bp meds for last 3 months  . Kidney stones 2007  . Otosclerosis of both ears   . PUD (peptic ulcer disease)    Has had unspecified surgery for this  . Sleep apnea     has appt on 09-22-15 to get for CPAP   Past Surgical History:  Procedure Laterality Date  . NEPHRECTOMY  02/18/2011   Procedure: NEPHRECTOMY;  Surgeon: Hanley Ben, MD;  Location: [facility];  Service: Urology;  Laterality: Right;  . SMALL INTESTINE SURGERY    . STAPEDOTOMY  2005   lt ear jan, right ear sept  . surgery for ulcers  1990   Allergies  Allergen Reactions  . Beef-Derived Products     Cultural preference  . Pegademase Bovine Other (See Comments)    Cultural preference  . Poractant Alfa Other (See Comments)    Cultural preference  . Pork-Derived Products     Cultural preference   Current Outpatient Prescriptions  Medication Sig Dispense Refill  . amLODipine (NORVASC) 10 MG tablet Take 10 mg by mouth daily.    Marland Kitchen atorvastatin (LIPITOR) 80 MG tablet Take 1 tablet (80 mg total) by mouth daily. 90 tablet 3  . fenofibrate 160 MG tablet Take 1 tablet (160 mg total) by mouth daily. Take with your largest meal. 90 tablet 3  . omeprazole (PRILOSEC) 20 MG capsule Take  20 mg by mouth daily.     Marland Kitchen testosterone cypionate (DEPOTESTOSTERONE CYPIONATE) 200 MG/ML injection Inject into the muscle every 14 (fourteen) days.    . traMADol (ULTRAM) 50 MG tablet Take 1 tablet (50 mg total) by mouth every 8 (eight) hours as needed. 30 tablet 0  . Vitamin D, Ergocalciferol, (DRISDOL) 50000 units CAPS capsule Take 1 capsule (50,000 Units total) by mouth every 7 (seven) days. 12 capsule 0  . furosemide (LASIX) 20 MG tablet Take 1 tablet (20 mg total) by mouth daily. 30 tablet 2   No current  facility-administered medications for this visit.    Social History   Social History  . Marital status: Married    Spouse name: N/A  . Number of children: 4  . Years of education: N/A   Occupational History  .  [employer]   Social History Main Topics  . Smoking status: Never Smoker  . Smokeless tobacco: Never Used  . Alcohol use No  . Drug use: No  . Sexual activity: Not on file   Other Topics Concern  . Not on file   Social History Narrative   Lives at home with wife and family. He is from [place]. Came to the [place] in 2002.   Family History  Problem Relation Age of Onset  . Hyperlipidemia Father   . Heart disease Mother   . Hypertension Mother   . Hypertension Sister   . Heart disease Brother 76       CABG  . Hyperlipidemia Sister   . Hyperlipidemia Sister   . Esophageal cancer Cousin   . Colon cancer Neg Hx   . Stomach cancer Neg Hx   . Rectal cancer Neg Hx        Objective:    BP 139/76   Pulse 89   Temp 98 F (36.7 C) (Oral)   Resp 18   Ht 5' 10.08" (1.78 m)   Wt 252 lb (114.3 kg)   SpO2 98%   BMI 36.08 kg/m  Physical Exam  Constitutional: He is oriented to person, place, and time. He appears well-developed and well-nourished. No distress.  HENT:  Head: Normocephalic and atraumatic.  Right Ear: External ear normal.  Left Ear: External ear normal.  Nose: Nose normal.  Mouth/Throat: Oropharynx is clear and moist.  Eyes: Pupils are equal, round, and reactive to light. Conjunctivae and EOM are normal.  Neck: Normal range of motion. Neck supple. Carotid bruit is not present. No thyromegaly present.  Cardiovascular: Normal rate, regular rhythm, normal heart sounds and intact distal pulses.  Exam reveals no gallop and no friction rub.   No murmur heard. Pulmonary/Chest: Effort normal and breath sounds normal. He has no wheezes. He has no rales.  Abdominal: Soft. Bowel sounds are normal. He exhibits no distension and no mass. There is no tenderness.  There is no rebound and no guarding.  Lymphadenopathy:    He has no cervical adenopathy.  Neurological: He is alert and oriented to person, place, and time. No cranial nerve deficit.  Skin: Skin is warm and dry. No rash noted. He is not diaphoretic.  Psychiatric: He has a normal mood and affect. His behavior is normal.  Nursing note and vitals reviewed.  Results for orders placed or performed in visit on 05/11/16  Microalbumin, urine  Result Value Ref Range   Albumin, Urine 3,388.0 Not Estab. ug/mL  Multiple Myeloma Panel (SPEP&IFE w/QIG)  Result Value Ref Range   IgG (Immunoglobin G), Serum 599 (  L) 700 - 1,600 mg/dL   IgA/Immunoglobulin A, Serum 90 90 - 386 mg/dL   IgM (Immunoglobulin M), Srm 38 20 - 172 mg/dL   Total Protein 6.2 6.0 - 8.5 g/dL   Albumin SerPl Elph-Mcnc 3.3 2.9 - 4.4 g/dL   Alpha 1 0.2 0.0 - 0.4 g/dL   Alpha2 Glob SerPl Elph-Mcnc 1.0 0.4 - 1.0 g/dL   B-Globulin SerPl Elph-Mcnc 1.1 0.7 - 1.3 g/dL   Gamma Glob SerPl Elph-Mcnc 0.6 0.4 - 1.8 g/dL   M Protein SerPl Elph-Mcnc Not Observed Not Observed g/dL   Globulin, Total 2.9 2.2 - 3.9 g/dL   Albumin/Glob SerPl 1.2 0.7 - 1.7   IFE 1 Comment    Please Note Comment   VITAMIN D 25 Hydroxy (Vit-D Deficiency, Fractures)  Result Value Ref Range   Vit D, 25-Hydroxy 9.4 (L) 30.0 - 100.0 ng/mL  CBC with Differential/Platelet  Result Value Ref Range   WBC 7.0 3.4 - 10.8 x10E3/uL   RBC 5.27 4.14 - 5.80 x10E6/uL   Hemoglobin 15.2 13.0 - 17.7 g/dL   Hematocrit 44.0 37.5 - 51.0 %   MCV 84 79 - 97 fL   MCH 28.8 26.6 - 33.0 pg   MCHC 34.5 31.5 - 35.7 g/dL   RDW 14.2 12.3 - 15.4 %   Platelets 205 150 - 379 x10E3/uL   Neutrophils 65 Not Estab. %   Lymphs 22 Not Estab. %   Monocytes 10 Not Estab. %   Eos 2 Not Estab. %   Basos 0 Not Estab. %   Neutrophils Absolute 4.5 1.4 - 7.0 x10E3/uL   Lymphocytes Absolute 1.6 0.7 - 3.1 x10E3/uL   Monocytes Absolute 0.7 0.1 - 0.9 x10E3/uL   EOS (ABSOLUTE) 0.1 0.0 - 0.4 x10E3/uL    Basophils Absolute 0.0 0.0 - 0.2 x10E3/uL   Immature Granulocytes 1 Not Estab. %   Immature Grans (Abs) 0.1 0.0 - 0.1 x10E3/uL   No results found. Depression screen [facility] 2/9 09/23/2016 05/11/2016 04/04/2016 09/18/2014  Decreased Interest 0 0 0 0  Down, Depressed, Hopeless 0 0 0 0  PHQ - 2 Score 0 0 0 0   Fall Risk  09/23/2016 05/11/2016 04/04/2016  Falls in the past year? No No No        Assessment & Plan:   1. Essential hypertension   2. Vitamin D deficiency   3. Mixed hyperlipidemia   4. Other fatigue   5. Persistent proteinuria   6. Testicular hypofunction   7. BPH without obstruction/lower urinary tract symptoms    -blood pressure moderately controlled; obtain labs; continue current medications. -s/p nephrology consultation for proteinuria and renal insufficiency.  Repeat labs today.  Refill of Lasix 28m daily provided. -suffering with ongoing fatigue; s/p sleep study in 2017; need to obtain copy of sleep study.  Receiving testosterone from urology without improvement in fatigue.  Appointment this month with cardiology. -new onset vitamin D deficiency; obtain labs; s/p twelve weeks of vitamin D 50,000 IU supplementation.   Orders Placed This Encounter  Procedures  . Comprehensive metabolic panel  . CBC with Differential/Platelet  . VITAMIN D 25 Hydroxy (Vit-D Deficiency, Fractures)  . TSH  . POCT urinalysis dipstick   Meds ordered this encounter  Medications  . amLODipine (NORVASC) 10 MG tablet    Sig: Take 10 mg by mouth daily.  .Marland Kitchentestosterone cypionate (DEPOTESTOSTERONE CYPIONATE) 200 MG/ML injection    Sig: Inject into the muscle every 14 (fourteen) days.  . furosemide (LASIX) 20 MG tablet  Sig: Take 1 tablet (20 mg total) by mouth daily.    Dispense:  30 tablet    Refill:  2    Return in about 3 months (around 12/24/2016) for recheck high cholesterol, protein in urine.   Floyd Wade Elayne Guerin, M.D. Primary [facility] at [facility] [place] [address] [place], [place]  [address] [phone] phone [phone] fax

## 2016-09-23 NOTE — Patient Instructions (Signed)
     IF you received an x-ray today, you will receive an invoice from [facility]. Please contact [facility] at [phone] with questions or concerns regarding your invoice.   IF you received labwork today, you will receive an invoice from [facility]. Please contact [facility] at [phone] with questions or concerns regarding your invoice.   Our billing staff will not be able to assist you with questions regarding bills from these companies.  You will be contacted with the lab results as soon as they are available. The fastest way to get your results is to activate your My Chart account. Instructions are located on the last page of this paperwork. If you have not heard from us regarding the results in 2 weeks, please contact this office.     

## 2016-09-24 LAB — CBC WITH DIFFERENTIAL/PLATELET
Basophils Absolute: 0 10*3/uL (ref 0.0–0.2)
Basos: 1 %
EOS (ABSOLUTE): 0.1 10*3/uL (ref 0.0–0.4)
Eos: 2 %
Hematocrit: 40.2 % (ref 37.5–51.0)
Hemoglobin: 13.9 g/dL (ref 13.0–17.7)
Immature Grans (Abs): 0 10*3/uL (ref 0.0–0.1)
Immature Granulocytes: 0 %
Lymphocytes Absolute: 2.2 10*3/uL (ref 0.7–3.1)
Lymphs: 28 %
MCH: 29.1 pg (ref 26.6–33.0)
MCHC: 34.6 g/dL (ref 31.5–35.7)
MCV: 84 fL (ref 79–97)
Monocytes Absolute: 0.6 10*3/uL (ref 0.1–0.9)
Monocytes: 7 %
Neutrophils Absolute: 4.9 10*3/uL (ref 1.4–7.0)
Neutrophils: 62 %
Platelets: 241 10*3/uL (ref 150–379)
RBC: 4.77 x10E6/uL (ref 4.14–5.80)
RDW: 14.7 % (ref 12.3–15.4)
WBC: 7.9 10*3/uL (ref 3.4–10.8)

## 2016-09-24 LAB — COMPREHENSIVE METABOLIC PANEL
ALBUMIN: 4.1 g/dL (ref 3.5–5.5)
ALK PHOS: 64 IU/L (ref 39–117)
ALT: 18 IU/L (ref 0–44)
AST: 17 IU/L (ref 0–40)
Albumin/Globulin Ratio: 1.8 (ref 1.2–2.2)
BUN / CREAT RATIO: 15 (ref 9–20)
BUN: 28 mg/dL — ABNORMAL HIGH (ref 6–24)
Bilirubin Total: 0.4 mg/dL (ref 0.0–1.2)
CO2: 22 mmol/L (ref 20–29)
CREATININE: 1.83 mg/dL — AB (ref 0.76–1.27)
Calcium: 9 mg/dL (ref 8.7–10.2)
Chloride: 104 mmol/L (ref 96–106)
GFR calc Af Amer: 48 mL/min/{1.73_m2} — ABNORMAL LOW (ref >59)
GFR, EST NON AFRICAN AMERICAN: 42 mL/min/{1.73_m2} — AB (ref >59)
GLOBULIN, TOTAL: 2.3 g/dL (ref 1.5–4.5)
Glucose: 85 mg/dL (ref 65–99)
Potassium: 4.7 mmol/L (ref 3.5–5.2)
SODIUM: 143 mmol/L (ref 134–144)
Total Protein: 6.4 g/dL (ref 6.0–8.5)

## 2016-09-24 LAB — VITAMIN D 25 HYDROXY (VIT D DEFICIENCY, FRACTURES): VIT D 25 HYDROXY: 15.1 ng/mL — AB (ref 30.0–100.0)

## 2016-09-24 LAB — TSH: TSH: 1.2 u[IU]/mL (ref 0.450–4.500)

## 2016-10-07 ENCOUNTER — Telehealth: Payer: Self-pay | Admitting: *Deleted

## 2016-10-07 ENCOUNTER — Ambulatory Visit (INDEPENDENT_AMBULATORY_CARE_PROVIDER_SITE_OTHER): Payer: 59 | Admitting: Cardiology

## 2016-10-07 ENCOUNTER — Other Ambulatory Visit: Payer: Self-pay | Admitting: *Deleted

## 2016-10-07 ENCOUNTER — Telehealth: Payer: Self-pay | Admitting: Family Medicine

## 2016-10-07 ENCOUNTER — Encounter: Payer: Self-pay | Admitting: Cardiology

## 2016-10-07 ENCOUNTER — Encounter: Payer: Self-pay | Admitting: *Deleted

## 2016-10-07 VITALS — BP 136/82 | HR 74 | Ht 70.0 in | Wt 252.0 lb

## 2016-10-07 DIAGNOSIS — R5383 Other fatigue: Secondary | ICD-10-CM

## 2016-10-07 DIAGNOSIS — E782 Mixed hyperlipidemia: Secondary | ICD-10-CM

## 2016-10-07 DIAGNOSIS — G4733 Obstructive sleep apnea (adult) (pediatric): Secondary | ICD-10-CM

## 2016-10-07 DIAGNOSIS — I5033 Acute on chronic diastolic (congestive) heart failure: Secondary | ICD-10-CM | POA: Diagnosis not present

## 2016-10-07 DIAGNOSIS — I10 Essential (primary) hypertension: Secondary | ICD-10-CM | POA: Diagnosis not present

## 2016-10-07 DIAGNOSIS — E785 Hyperlipidemia, unspecified: Secondary | ICD-10-CM

## 2016-10-07 LAB — CBC WITH DIFFERENTIAL/PLATELET
Basophils Absolute: 0 10*3/uL (ref 0.0–0.2)
Basos: 1 %
EOS (ABSOLUTE): 0.1 10*3/uL (ref 0.0–0.4)
Eos: 2 %
Hematocrit: 39.4 % (ref 37.5–51.0)
Hemoglobin: 13.8 g/dL (ref 13.0–17.7)
Immature Grans (Abs): 0 10*3/uL (ref 0.0–0.1)
Immature Granulocytes: 1 %
Lymphocytes Absolute: 2.2 10*3/uL (ref 0.7–3.1)
Lymphs: 34 %
MCH: 29.1 pg (ref 26.6–33.0)
MCHC: 35 g/dL (ref 31.5–35.7)
MCV: 83 fL (ref 79–97)
Monocytes Absolute: 0.5 10*3/uL (ref 0.1–0.9)
Monocytes: 8 %
Neutrophils Absolute: 3.6 10*3/uL (ref 1.4–7.0)
Neutrophils: 54 %
Platelets: 213 10*3/uL (ref 150–379)
RBC: 4.75 x10E6/uL (ref 4.14–5.80)
RDW: 13.3 % (ref 12.3–15.4)
WBC: 6.5 10*3/uL (ref 3.4–10.8)

## 2016-10-07 LAB — LIPID PANEL
Chol/HDL Ratio: 4.6 ratio (ref 0.0–5.0)
Cholesterol, Total: 166 mg/dL (ref 100–199)
HDL: 36 mg/dL — ABNORMAL LOW (ref >39)
LDL Calculated: 51 mg/dL (ref 0–99)
Triglycerides: 393 mg/dL — ABNORMAL HIGH (ref 0–149)
VLDL Cholesterol Cal: 79 mg/dL — ABNORMAL HIGH (ref 5–40)

## 2016-10-07 LAB — BASIC METABOLIC PANEL
BUN/Creatinine Ratio: 20 (ref 9–20)
BUN: 32 mg/dL — ABNORMAL HIGH (ref 6–24)
CO2: 20 mmol/L (ref 20–29)
Calcium: 9.6 mg/dL (ref 8.7–10.2)
Chloride: 105 mmol/L (ref 96–106)
Creatinine, Ser: 1.62 mg/dL — ABNORMAL HIGH (ref 0.76–1.27)
GFR calc Af Amer: 56 mL/min/{1.73_m2} — ABNORMAL LOW (ref >59)
GFR calc non Af Amer: 48 mL/min/{1.73_m2} — ABNORMAL LOW (ref >59)
Glucose: 102 mg/dL — ABNORMAL HIGH (ref 65–99)
Potassium: 4.9 mmol/L (ref 3.5–5.2)
Sodium: 139 mmol/L (ref 134–144)

## 2016-10-07 LAB — PRO B NATRIURETIC PEPTIDE: NT-Pro BNP: 94 pg/mL (ref 0–121)

## 2016-10-07 MED ORDER — FENOFIBRATE 160 MG PO TABS: 160.0000 mg | ORAL_TABLET | Freq: Every day | ORAL | 2 refills | Status: DC

## 2016-10-07 MED ORDER — ATENOLOL 25 MG PO TABS: 25.0000 mg | ORAL_TABLET | Freq: Every day | ORAL | 3 refills | Status: DC

## 2016-10-07 MED ORDER — AMLODIPINE BESYLATE 5 MG PO TABS: 5.0000 mg | ORAL_TABLET | Freq: Every day | ORAL | 3 refills | Status: DC

## 2016-10-07 NOTE — Patient Instructions (Signed)
Medication Instructions:   STOP TAKING LASIX NOW  DECREASE YOUR AMLODIPINE TO 5 MG ONCE DAILY  START TAKING ATENOLOL 25 MG ONCE DAILY     Labwork:  TODAY--BMET, CBC W DIFF, PRO-BNP, AND LIPIDS     Follow-Up:  3 WEEKS WITH BRITTANY SIMMONS PA-C       If you need a refill on your cardiac medications before your next appointment, please call your pharmacy.

## 2016-10-07 NOTE — Telephone Encounter (Signed)
-----   Message from [name], MD sent at 10/07/2016  3:58 PM EDT ----- No CHF, hold lasix as instructed this am, stay away from salt. LDL now at goal, TG still elevated, add fenofibrate 160 mg po daily. KN

## 2016-10-07 NOTE — Telephone Encounter (Signed)
Notified the pt that per Dr [name], his labs showed no CHF, and she recommends that he continue with staying off lasix, should stay away from salt.  Also informed the pt that per Dr [name], his LDL is now at goal, TG still elevated, and she recommends that we add fenofibrate 160 mg po daily.  Confirmed the pharmacy of choice with the pt.  Pt verbalized understanding and agrees with this plan.

## 2016-10-07 NOTE — Progress Notes (Signed)
10/07/2016 [name]   November 17, 1965  [id number]  Primary Physician Forrest Moron, MD Primary Cardiologist: Dr. [name]    Reason for Visit/CC: f/u for Fatigue and HTN  HPI:  The patient is a 51 y/o [place] male with a hx of Neuropathy, chronic fatigue, peptic ulcer disease status post prior gastric surgery in [place], HTN, HL, CKD (solitary kidney - R kidney removed). Myoview in 3/15 was low risk and negative for ischemia. He is followed by Dr. [name]. He was seen recently by [name], PA-C, on 12/09/15 and complained of fatigue. He also complained of LEE. Labs were obtained including a BNP, CMP, CBC and TSH. BNP was normal, as well as CBC. CMP showed stable renal insufficieny with SCr at 1.53. 2D echo revealed normal LVEF. He was advised to f/u with his sleep doctor. There was also ? As to whether or not his amlodipine was contributing to his edema. Plan was to consider switch to either a thiazide diuretic or ACE/ARB.   10/07/2016 - 9 months follow up he is complaining of inability to sleep He wakes up tired mots mornings. He refuses to use CPAP as its uncomfortable for him. He works 12 hour shifts and is able to complete work. No orthopnea or PND. But chronic LE edema. No palpitations or syncope.No muscle pain with atorvastatin.  Current Meds  Medication Sig  . amLODipine (NORVASC) 10 MG tablet Take 10 mg by mouth daily.  Marland Kitchen atorvastatin (LIPITOR) 80 MG tablet Take 1 tablet (80 mg total) by mouth daily.  . furosemide (LASIX) 20 MG tablet Take 1 tablet (20 mg total) by mouth daily.   Allergies  Allergen Reactions  . Beef-Derived Products     Cultural preference  . Pegademase Bovine Other (See Comments)    Cultural preference  . Poractant Alfa Other (See Comments)    Cultural preference  . Pork-Derived Products     Cultural preference   Past Medical History:  Diagnosis Date  . Anemia   . Back pain   . Colon polyps    adenomatous  . Elevated cholesterol   . History of  echocardiogram    Echo 11/17: EF 65-70, normal wall motion, grade 1 diastolic dysfunction, trivial MR, mild LAE, mild TR  . Hyperlipidemia   . Hypertension    no current bp meds for last 3 months  . Kidney stones 2007  . Otosclerosis of both ears   . PUD (peptic ulcer disease)    Has had unspecified surgery for this  . Sleep apnea     has appt on 09-22-15 to get for CPAP   Family History  Problem Relation Age of Onset  . Hyperlipidemia Father   . Heart disease Mother   . Hypertension Mother   . Hypertension Sister   . Heart disease Brother 69       CABG  . Hyperlipidemia Sister   . Hyperlipidemia Sister   . Esophageal cancer Cousin   . Colon cancer Neg Hx   . Stomach cancer Neg Hx   . Rectal cancer Neg Hx    Past Surgical History:  Procedure Laterality Date  . NEPHRECTOMY  02/18/2011   Procedure: NEPHRECTOMY;  Surgeon: Hanley Ben, MD;  Location: [facility];  Service: Urology;  Laterality: Right;  . SMALL INTESTINE SURGERY    . STAPEDOTOMY  2005   lt ear jan, right ear sept  . surgery for ulcers  1990   Social History   Social History  . Marital status:  Married    Spouse name: N/A  . Number of children: 4  . Years of education: N/A   Occupational History  .  [employer]   Social History Main Topics  . Smoking status: Never Smoker  . Smokeless tobacco: Never Used  . Alcohol use No  . Drug use: No  . Sexual activity: Not on file   Other Topics Concern  . Not on file   Social History Narrative   Lives at home with wife and family. He is from [place]. Came to the [place] in 2002.     Review of Systems: General: negative for chills, fever, night sweats or weight changes.  Cardiovascular: negative for chest pain, dyspnea on exertion, edema, orthopnea, palpitations, paroxysmal nocturnal dyspnea or shortness of breath Dermatological: negative for rash Respiratory: negative for cough or wheezing Urologic: negative for hematuria Abdominal: negative for nausea,  vomiting, diarrhea, bright red blood per rectum, melena, or hematemesis Neurologic: negative for visual changes, syncope, or dizziness All other systems reviewed and are otherwise negative except as noted above.   Physical Exam:  Blood pressure 136/82, pulse 74, height 5\' 10"  (1.778 m), weight 252 lb (114.3 kg).  General appearance: alert, cooperative and no distress Neck: no carotid bruit and no JVD Lungs: clear to auscultation bilaterally Heart: regular rate and rhythm, S1, S2 normal, no murmur, click, rub or gallop Extremities: 1+ bilateral LE Pulses: 2+ and symmetric Skin: Skin color, texture, turgor normal. No rashes or lesions Neurologic: Grossly normal  EKG Not performed   ASSESSMENT AND PLAN:   1. HTN: controlled on amlodipine but worsening LE edema with increased dose of amlodipine, I will decrease to 5 mg po daily and start atenolol 25 mg po daily.  2. LE edema - as above, decrease amlodipine to 5 mg po daily, start atenolol, hold lasix as worsening Crea, 1.8, baseline 1.4-1.5, today 1.6, BNP 94, He is not complaint with low sodium diet.  3. Fatigue: TSH and CBC normal. 2D echo also with normal LVF. Patient stopped use of CPAP. He was notified that untreated OSA can be a cause of fatigue. He was advised to f/u with his sleep medicine doctor for further management.   4. OSA: per above.   5. HLP - on atorvastatin, LDL 51, TG 393, add fenofibrate 160 mg po daily  PLAN  F/u in 3 weeks for BP and Crea recheck.    Ena Dawley, MD 10/07/2016 9:51 AM

## 2016-10-10 NOTE — Addendum Note (Signed)
Addended by: Paulla Dolly on: 10/10/2016 03:34 PM   Modules accepted: Orders

## 2016-10-18 NOTE — Telephone Encounter (Signed)
PATIENT STATES HE HAS CALLED 4 TIMES TO GET HIS LAB RESULTS AND NO ONE EVER CALLS HIM BACK. HE WANTS TO KNOW IF HE NEEDS TO TAKE MEDICINE FOR HIS VITAMIN D AND WHAT HIS CHOLESTROL RESULTS ARE? BEST PHONE [phone] (CELL) [place]

## 2016-10-19 NOTE — Telephone Encounter (Signed)
Dr. Tamala Julian these are from [facility] 8/10. Please advise.

## 2016-10-21 ENCOUNTER — Encounter: Payer: Self-pay | Admitting: Family Medicine

## 2016-10-21 MED ORDER — VITAMIN D (ERGOCALCIFEROL) 1.25 MG (50000 UNIT) PO CAPS: 50000.0000 [IU] | ORAL_CAPSULE | ORAL | 0 refills | Status: DC

## 2016-10-21 NOTE — Telephone Encounter (Signed)
Left message on patient's voicemail.  Please try to call him again later with results: -Creatinine has increased further since last visit six months ago; your creatinine is 1.83 and your baseline creatinine has been 1.33-1.55 for the past two years.  It is important to undergo consultation with nephrologist or kidney specialist.   -liver function is normal. -No evidence of anemia. -Vitamin D level is still low but has improved; I recommend continuing weekly vitamin D for the next three months and then repeat level again. -Thyroid function (TSH) is normal.  -Urine shows large amount of protein.

## 2016-10-24 NOTE — Telephone Encounter (Signed)
LVM to call back for results.

## 2016-10-31 ENCOUNTER — Encounter: Payer: Self-pay | Admitting: Family Medicine

## 2016-11-02 ENCOUNTER — Ambulatory Visit: Payer: 59 | Admitting: Physician Assistant

## 2016-11-15 ENCOUNTER — Encounter: Payer: Self-pay | Admitting: Physician Assistant

## 2016-11-15 ENCOUNTER — Ambulatory Visit (INDEPENDENT_AMBULATORY_CARE_PROVIDER_SITE_OTHER): Payer: 59 | Admitting: Physician Assistant

## 2016-11-15 ENCOUNTER — Encounter (INDEPENDENT_AMBULATORY_CARE_PROVIDER_SITE_OTHER): Payer: Self-pay

## 2016-11-15 VITALS — BP 158/100 | HR 94 | Ht 70.0 in | Wt 254.8 lb

## 2016-11-15 DIAGNOSIS — N289 Disorder of kidney and ureter, unspecified: Secondary | ICD-10-CM

## 2016-11-15 DIAGNOSIS — I10 Essential (primary) hypertension: Secondary | ICD-10-CM

## 2016-11-15 DIAGNOSIS — E785 Hyperlipidemia, unspecified: Secondary | ICD-10-CM | POA: Diagnosis not present

## 2016-11-15 DIAGNOSIS — I5032 Chronic diastolic (congestive) heart failure: Secondary | ICD-10-CM

## 2016-11-15 MED ORDER — ATENOLOL 25 MG PO TABS: 25.0000 mg | ORAL_TABLET | Freq: Every day | ORAL | 3 refills | Status: DC

## 2016-11-15 MED ORDER — ATORVASTATIN CALCIUM 80 MG PO TABS: 80.0000 mg | ORAL_TABLET | Freq: Every day | ORAL | 3 refills | Status: DC

## 2016-11-15 MED ORDER — FENOFIBRATE 160 MG PO TABS: 160.0000 mg | ORAL_TABLET | Freq: Every day | ORAL | 2 refills | Status: DC

## 2016-11-15 NOTE — Progress Notes (Signed)
Cardiology Office Note    Date:  11/15/2016   ID:  [name], DOB 12/25/1965, MRN [id number]  PCP:  System, Pcp Not In  Cardiologist: Dr. [name]  Chief Complaint  Patient presents with  . Follow-up    History of Present Illness:  [name] is a 51 y.o. male [place] male with history of neuropathy, hypertension, HLD, CK D right kidney removed, Myoview 2015 low risk negative for ischemia. 2-D echo normal LVEF 11/2015. Saw Dr. [name] 10/07/16 complaining of inability to sleep, refuses to use his C Pap.  Amlodipine caused worsening edema so she decreased it to 5 mg once daily starting atenolol 25 mg daily for better blood pressure control. Lasix was held because of worsening creatinine of 1.8. Creatinine 1.62 on 10/07/16.  Patient comes in today saying he stopped all his medications because of feeling better off of them. Blood pressure is 160/96 and 158/100. He does not follow low salt diet. He says they called leg swelling so he doesn't want to take them. He doesn't think the other medications are helping his lipids either. He wants to know why feels bad when he eats sugars are drink sodas or eats beans and his stomach swells up.   Past Medical History:  Diagnosis Date  . Anemia   . Back pain   . Colon polyps    adenomatous  . Elevated cholesterol   . History of echocardiogram    Echo 11/17: EF 65-70, normal wall motion, grade 1 diastolic dysfunction, trivial MR, mild LAE, mild TR  . Hyperlipidemia   . Hypertension    no current bp meds for last 3 months  . Kidney stones 2007  . Otosclerosis of both ears   . PUD (peptic ulcer disease)    Has had unspecified surgery for this  . Sleep apnea     has appt on 09-22-15 to get for CPAP    Past Surgical History:  Procedure Laterality Date  . NEPHRECTOMY  02/18/2011   Procedure: NEPHRECTOMY;  Surgeon: Hanley Ben, MD;  Location: [facility];  Service: Urology;  Laterality: Right;  . SMALL INTESTINE SURGERY    . STAPEDOTOMY  2005   lt  ear jan, right ear sept  . surgery for ulcers  1990    Current Medications: Current Meds  Medication Sig  . Vitamin D, Ergocalciferol, (DRISDOL) 50000 units CAPS capsule Take 1 capsule (50,000 Units total) by mouth every 7 (seven) days.     Allergies:   Beef-derived products; Pegademase bovine; Poractant alfa; and Pork-derived products   Social History   Social History  . Marital status: Married    Spouse name: N/A  . Number of children: 4  . Years of education: N/A   Occupational History  .  [employer]   Social History Main Topics  . Smoking status: Never Smoker  . Smokeless tobacco: Never Used  . Alcohol use No  . Drug use: No  . Sexual activity: Not Asked   Other Topics Concern  . None   Social History Narrative   Lives at home with wife and family. He is from [place]. Came to the [place] in 2002.     Family History:  The patient's family history includes Esophageal cancer in his cousin; Heart disease in his mother; Heart disease (age of onset: 20) in his brother; Hyperlipidemia in his father, sister, and sister; Hypertension in his mother and sister.   ROS:   Please see the history of present illness.  Review of Systems  Musculoskeletal: Positive for back pain and myalgias.  Gastrointestinal: Positive for bloating.  Neurological: Positive for headaches.   All other systems reviewed and are negative.   PHYSICAL EXAM:   VS:  BP (!) 158/100 (BP Location: Left Arm, Patient Position: Sitting, Cuff Size: Large)   Pulse 94   Ht 5\' 10"  (1.778 m)   Wt 254 lb 12.8 oz (115.6 kg)   SpO2 96%   BMI 36.56 kg/m   Physical Exam  GEN: Obese, in no acute distress  Neck: no JVD, carotid bruits, or masses Cardiac:RRR; positive S4, distant heart sounds  Respiratory:  clear to auscultation bilaterally, normal work of breathing GI: soft, nontender, nondistended, + BS Ext: without cyanosis, clubbing, or edema, Good distal pulses bilaterally Neuro:  Alert and Oriented x  3 Psych: euthymic mood, full affect  Wt Readings from Last 3 Encounters:  11/15/16 254 lb 12.8 oz (115.6 kg)  10/07/16 252 lb (114.3 kg)  09/23/16 252 lb (114.3 kg)      Studies/Labs Reviewed:   EKG:  EKG is not ordered today.    Recent Labs: 12/09/2015: Brain Natriuretic Peptide 10.8 09/23/2016: ALT 18; TSH 1.200 10/07/2016: BUN 32; Creatinine, Ser 1.62; Hemoglobin 13.8; NT-Pro BNP 94; Platelets 213; Potassium 4.9; Sodium 139   Lipid Panel    Component Value Date/Time   CHOL 166 10/07/2016 1032   TRIG 393 (H) 10/07/2016 1032   HDL 36 (L) 10/07/2016 1032   CHOLHDL 4.6 10/07/2016 1032   CHOLHDL 5.3 (H) 12/09/2015 1328   VLDL NOT CALC 12/09/2015 1328   LDLCALC 51 10/07/2016 1032   LDLDIRECT 139.0 05/06/2014 1209    Additional studies/ records that were reviewed today include:   2-D echo 11/2017Study Conclusions   - Left ventricle: The cavity size was normal. Systolic function was   vigorous. The estimated ejection fraction was in the range of 65%   to 70%. Wall motion was normal; there were no regional wall   motion abnormalities. Doppler parameters are consistent with   abnormal left ventricular relaxation (grade 1 diastolic   dysfunction). Doppler parameters are consistent with elevated   ventricular end-diastolic filling pressure. - Aortic valve: There was no regurgitation. - Aortic root: The aortic root was normal in size. - Ascending aorta: The ascending aorta was normal in size. - Mitral valve: There was trivial regurgitation. - Left atrium: The atrium was mildly dilated. - Right ventricle: The cavity size was normal. Wall thickness was   normal. Systolic function was normal. - Right atrium: The atrium was normal in size. - Tricuspid valve: There was mild regurgitation. - Pulmonary arteries: Systolic pressure was within the normal   range. - Inferior vena cava: The vessel was normal in size. - Pericardium, extracardiac: There was no pericardial effusion.       ASSESSMENT:    1. Essential hypertension   2. Chronic diastolic CHF (congestive heart failure) ([place])   3. Hyperlipidemia, unspecified hyperlipidemia type   4. Renal insufficiency      PLAN:  In order of problems listed above:  Essential hypertension patient's blood pressure is quite high today because he stopped all his medications because of ankle swelling. He says he is willing to try taking the atenolol without the amlodipine. We'll resume atenolol 25 mg once daily. I doubt that this will be enough for him. He cannot give him an ACE inhibitor/ARB her diuretic because of renal insufficiency and one kidney.2 gram sodium diet. We'll have him follow-up with the pharmacist  for medication management.  Chronic diastolic CHF compensated  Hyperlipidemia with recent lipids showing elevated triglycerides. Resume Lipitor and start fenofibrate. Patient says he'll try it.  Renal insufficiency with one kidney check renal function today. Creatinine 1.6 last office visit.    Medication Adjustments/Labs and Tests Ordered: Current medicines are reviewed at length with the patient today.  Concerns regarding medicines are outlined above.  Medication changes, Labs and Tests ordered today are listed in the Patient Instructions below. Patient Instructions  Please restart the following medications: 1.) atenolol 25 mg once a day 2.) atorvastatin 80 mg once a day (evening) 3.) fenofibrate 160 mg once a day  Your physician recommends that you return for lab work today Artist)  Your physician recommends that you schedule a follow-up appointment in: the [facility] in this office.   Low-Sodium Eating Plan Sodium, which is an element that makes up salt, helps you maintain a healthy balance of fluids in your body. Too much sodium can increase your blood pressure and cause fluid and waste to be held in your body. Your health care provider or dietitian may recommend following this plan if you  have high blood pressure (hypertension), kidney disease, liver disease, or heart failure. Eating less sodium can help lower your blood pressure, reduce swelling, and protect your heart, liver, and kidneys. What are tips for following this plan? General guidelines  Most people on this plan should limit their sodium intake to 1,500-2,000 mg (milligrams) of sodium each day. Reading food labels  The Nutrition Facts label lists the amount of sodium in one serving of the food. If you eat more than one serving, you must multiply the listed amount of sodium by the number of servings.  Choose foods with less than 140 mg of sodium per serving.  Avoid foods with 300 mg of sodium or more per serving. Shopping  Look for lower-sodium products, often labeled as "low-sodium" or "no salt added."  Always check the sodium content even if foods are labeled as "unsalted" or "no salt added".  Buy fresh foods. ? Avoid canned foods and premade or frozen meals. ? Avoid canned, cured, or processed meats  Buy breads that have less than 80 mg of sodium per slice. Cooking  Eat more home-cooked food and less restaurant, buffet, and fast food.  Avoid adding salt when cooking. Use salt-free seasonings or herbs instead of table salt or sea salt. Check with your health care provider or pharmacist before using salt substitutes.  Cook with plant-based oils, such as canola, sunflower, or olive oil. Meal planning  When eating at a restaurant, ask that your food be prepared with less salt or no salt, if possible.  Avoid foods that contain MSG (monosodium glutamate). MSG is sometimes added to [place] food, bouillon, and some canned foods. What foods are recommended? The items listed may not be a complete list. Talk with your dietitian about what dietary choices are best for you. Grains Low-sodium cereals, including oats, puffed wheat and rice, and shredded wheat. Low-sodium crackers. Unsalted rice. Unsalted pasta.  Low-sodium bread. Whole-grain breads and whole-grain pasta. Vegetables Fresh or frozen vegetables. "No salt added" canned vegetables. "No salt added" tomato sauce and paste. Low-sodium or reduced-sodium tomato and vegetable juice. Fruits Fresh, frozen, or canned fruit. Fruit juice. Meats and other protein foods Fresh or frozen (no salt added) meat, poultry, seafood, and fish. Low-sodium canned tuna and salmon. Unsalted nuts. Dried peas, beans, and lentils without added salt. Unsalted canned beans. Eggs. Unsalted nut  butters. Dairy Milk. Soy milk. Cheese that is naturally low in sodium, such as ricotta cheese, fresh mozzarella, or Swiss cheese Low-sodium or reduced-sodium cheese. Cream cheese. Yogurt. Fats and oils Unsalted butter. Unsalted margarine with no trans fat. Vegetable oils such as canola or olive oils. Seasonings and other foods Fresh and dried herbs and spices. Salt-free seasonings. Low-sodium mustard and ketchup. Sodium-free salad dressing. Sodium-free light mayonnaise. Fresh or refrigerated horseradish. Lemon juice. Vinegar. Homemade, reduced-sodium, or low-sodium soups. Unsalted popcorn and pretzels. Low-salt or salt-free chips. What foods are not recommended? The items listed may not be a complete list. Talk with your dietitian about what dietary choices are best for you. Grains Instant hot cereals. Bread stuffing, pancake, and biscuit mixes. Croutons. Seasoned rice or pasta mixes. Noodle soup cups. Boxed or frozen macaroni and cheese. Regular salted crackers. Self-rising flour. Vegetables Sauerkraut, pickled vegetables, and relishes. Olives. [place] fries. Onion rings. Regular canned vegetables (not low-sodium or reduced-sodium). Regular canned tomato sauce and paste (not low-sodium or reduced-sodium). Regular tomato and vegetable juice (not low-sodium or reduced-sodium). Frozen vegetables in sauces. Meats and other protein foods Meat or fish that is salted, canned, smoked, spiced,  or pickled. Bacon, ham, sausage, hotdogs, corned beef, chipped beef, packaged lunch meats, salt pork, jerky, pickled herring, anchovies, regular canned tuna, sardines, salted nuts. Dairy Processed cheese and cheese spreads. Cheese curds. Blue cheese. Feta cheese. String cheese. Regular cottage cheese. Buttermilk. Canned milk. Fats and oils Salted butter. Regular margarine. Ghee. Bacon fat. Seasonings and other foods Onion salt, garlic salt, seasoned salt, table salt, and sea salt. Canned and packaged gravies. Worcestershire sauce. Tartar sauce. Barbecue sauce. Teriyaki sauce. Soy sauce, including reduced-sodium. Steak sauce. Fish sauce. Oyster sauce. Cocktail sauce. Horseradish that you find on the shelf. Regular ketchup and mustard. Meat flavorings and tenderizers. Bouillon cubes. Hot sauce and Tabasco sauce. Premade or packaged marinades. Premade or packaged taco seasonings. Relishes. Regular salad dressings. Salsa. Potato and tortilla chips. Corn chips and puffs. Salted popcorn and pretzels. Canned or dried soups. Pizza. Frozen entrees and pot pies. Summary  Eating less sodium can help lower your blood pressure, reduce swelling, and protect your heart, liver, and kidneys.  Most people on this plan should limit their sodium intake to 1,500-2,000 mg (milligrams) of sodium each day.  Canned, boxed, and frozen foods are high in sodium. Restaurant foods, fast foods, and pizza are also very high in sodium. You also get sodium by adding salt to food.  Try to cook at home, eat more fresh fruits and vegetables, and eat less fast food, canned, processed, or prepared foods. This information is not intended to replace advice given to you by your health care provider. Make sure you discuss any questions you have with your health care provider. Document Released: 07/23/2001 Document Revised: 01/25/2016 Document Reviewed: 01/25/2016 Elsevier Interactive Patient Education  2017 [place], Ermalinda Barrios, [place]  11/15/2016 2:09 PM    [facility] [address], [place], [place]  [address] Phone: [phone]; Fax: [phone]

## 2016-11-15 NOTE — Patient Instructions (Signed)
Please restart the following medications: 1.) atenolol 25 mg once a day 2.) atorvastatin 80 mg once a day (evening) 3.) fenofibrate 160 mg once a day  Your physician recommends that you return for lab work today Artist)  Your physician recommends that you schedule a follow-up appointment in: the Hypertension [facility] in this office.   Low-Sodium Eating Plan Sodium, which is an element that makes up salt, helps you maintain a healthy balance of fluids in your body. Too much sodium can increase your blood pressure and cause fluid and waste to be held in your body. Your health care provider or dietitian may recommend following this plan if you have high blood pressure (hypertension), kidney disease, liver disease, or heart failure. Eating less sodium can help lower your blood pressure, reduce swelling, and protect your heart, liver, and kidneys. What are tips for following this plan? General guidelines  Most people on this plan should limit their sodium intake to 1,500-2,000 mg (milligrams) of sodium each day. Reading food labels  The Nutrition Facts label lists the amount of sodium in one serving of the food. If you eat more than one serving, you must multiply the listed amount of sodium by the number of servings.  Choose foods with less than 140 mg of sodium per serving.  Avoid foods with 300 mg of sodium or more per serving. Shopping  Look for lower-sodium products, often labeled as "low-sodium" or "no salt added."  Always check the sodium content even if foods are labeled as "unsalted" or "no salt added".  Buy fresh foods. ? Avoid canned foods and premade or frozen meals. ? Avoid canned, cured, or processed meats  Buy breads that have less than 80 mg of sodium per slice. Cooking  Eat more home-cooked food and less restaurant, buffet, and fast food.  Avoid adding salt when cooking. Use salt-free seasonings or herbs instead of table salt or sea salt. Check with your health care  provider or pharmacist before using salt substitutes.  Cook with plant-based oils, such as canola, sunflower, or olive oil. Meal planning  When eating at a restaurant, ask that your food be prepared with less salt or no salt, if possible.  Avoid foods that contain MSG (monosodium glutamate). MSG is sometimes added to [place] food, bouillon, and some canned foods. What foods are recommended? The items listed may not be a complete list. Talk with your dietitian about what dietary choices are best for you. Grains Low-sodium cereals, including oats, puffed wheat and rice, and shredded wheat. Low-sodium crackers. Unsalted rice. Unsalted pasta. Low-sodium bread. Whole-grain breads and whole-grain pasta. Vegetables Fresh or frozen vegetables. "No salt added" canned vegetables. "No salt added" tomato sauce and paste. Low-sodium or reduced-sodium tomato and vegetable juice. Fruits Fresh, frozen, or canned fruit. Fruit juice. Meats and other protein foods Fresh or frozen (no salt added) meat, poultry, seafood, and fish. Low-sodium canned tuna and salmon. Unsalted nuts. Dried peas, beans, and lentils without added salt. Unsalted canned beans. Eggs. Unsalted nut butters. Dairy Milk. Soy milk. Cheese that is naturally low in sodium, such as ricotta cheese, fresh mozzarella, or Swiss cheese Low-sodium or reduced-sodium cheese. Cream cheese. Yogurt. Fats and oils Unsalted butter. Unsalted margarine with no trans fat. Vegetable oils such as canola or olive oils. Seasonings and other foods Fresh and dried herbs and spices. Salt-free seasonings. Low-sodium mustard and ketchup. Sodium-free salad dressing. Sodium-free light mayonnaise. Fresh or refrigerated horseradish. Lemon juice. Vinegar. Homemade, reduced-sodium, or low-sodium soups. Unsalted popcorn and pretzels.  Low-salt or salt-free chips. What foods are not recommended? The items listed may not be a complete list. Talk with your dietitian about what  dietary choices are best for you. Grains Instant hot cereals. Bread stuffing, pancake, and biscuit mixes. Croutons. Seasoned rice or pasta mixes. Noodle soup cups. Boxed or frozen macaroni and cheese. Regular salted crackers. Self-rising flour. Vegetables Sauerkraut, pickled vegetables, and relishes. Olives. [place] fries. Onion rings. Regular canned vegetables (not low-sodium or reduced-sodium). Regular canned tomato sauce and paste (not low-sodium or reduced-sodium). Regular tomato and vegetable juice (not low-sodium or reduced-sodium). Frozen vegetables in sauces. Meats and other protein foods Meat or fish that is salted, canned, smoked, spiced, or pickled. Bacon, ham, sausage, hotdogs, corned beef, chipped beef, packaged lunch meats, salt pork, jerky, pickled herring, anchovies, regular canned tuna, sardines, salted nuts. Dairy Processed cheese and cheese spreads. Cheese curds. Blue cheese. Feta cheese. String cheese. Regular cottage cheese. Buttermilk. Canned milk. Fats and oils Salted butter. Regular margarine. Ghee. Bacon fat. Seasonings and other foods Onion salt, garlic salt, seasoned salt, table salt, and sea salt. Canned and packaged gravies. Worcestershire sauce. Tartar sauce. Barbecue sauce. Teriyaki sauce. Soy sauce, including reduced-sodium. Steak sauce. Fish sauce. Oyster sauce. Cocktail sauce. Horseradish that you find on the shelf. Regular ketchup and mustard. Meat flavorings and tenderizers. Bouillon cubes. Hot sauce and Tabasco sauce. Premade or packaged marinades. Premade or packaged taco seasonings. Relishes. Regular salad dressings. Salsa. Potato and tortilla chips. Corn chips and puffs. Salted popcorn and pretzels. Canned or dried soups. Pizza. Frozen entrees and pot pies. Summary  Eating less sodium can help lower your blood pressure, reduce swelling, and protect your heart, liver, and kidneys.  Most people on this plan should limit their sodium intake to 1,500-2,000 mg  (milligrams) of sodium each day.  Canned, boxed, and frozen foods are high in sodium. Restaurant foods, fast foods, and pizza are also very high in sodium. You also get sodium by adding salt to food.  Try to cook at home, eat more fresh fruits and vegetables, and eat less fast food, canned, processed, or prepared foods. This information is not intended to replace advice given to you by your health care provider. Make sure you discuss any questions you have with your health care provider. Document Released: 07/23/2001 Document Revised: 01/25/2016 Document Reviewed: 01/25/2016 Elsevier Interactive Patient Education  2017 [employer].

## 2016-11-16 LAB — BASIC METABOLIC PANEL
BUN / CREAT RATIO: 15 (ref 9–20)
BUN: 28 mg/dL — AB (ref 6–24)
CO2: 26 mmol/L (ref 20–29)
CREATININE: 1.86 mg/dL — AB (ref 0.76–1.27)
Calcium: 9.1 mg/dL (ref 8.7–10.2)
Chloride: 100 mmol/L (ref 96–106)
GFR calc Af Amer: 47 mL/min/{1.73_m2} — ABNORMAL LOW (ref >59)
GFR, EST NON AFRICAN AMERICAN: 41 mL/min/{1.73_m2} — AB (ref >59)
Glucose: 86 mg/dL (ref 65–99)
Potassium: 4.8 mmol/L (ref 3.5–5.2)
SODIUM: 137 mmol/L (ref 134–144)

## 2016-11-18 ENCOUNTER — Telehealth: Payer: Self-pay | Admitting: Cardiology

## 2016-11-18 NOTE — Telephone Encounter (Signed)
-----   Message from Imogene Burn, PA-C sent at 11/16/2016  7:50 AM EDT ----- Kidney function back up. Should see kidney specialist. Only has 1 kidney. If he doesn't already have a dr. Please refer to renal.

## 2016-11-18 NOTE — Telephone Encounter (Signed)
°  Follow Up ° °Returning call from yesterday. Please call. °

## 2016-11-18 NOTE — Telephone Encounter (Signed)
Follow Up ° °Pt returning call from earlier. Please call. °

## 2016-11-18 NOTE — Telephone Encounter (Signed)
Returned pts call and left another message for pt to call back. 

## 2016-11-21 NOTE — Telephone Encounter (Signed)
Left message for patient to call back  

## 2016-11-23 NOTE — Telephone Encounter (Signed)
Notes recorded by Imogene Burn, PA-C on 11/16/2016 at 7:50 AM EDT Kidney function back up. Should see kidney specialist. Only has 1 kidney. If he doesn't already have a dr. Please refer to renal.

## 2016-11-23 NOTE — Telephone Encounter (Signed)
Patient walked in the office today to get his lab results. Gave patient his lab results. Patient states that he see Dr. Gustavus Messing for his kidneys. Will send results to Dr. Gustavus Messing at [facility].

## 2016-11-23 NOTE — Telephone Encounter (Signed)
-----   Message from [name] sent at 11/23/2016  2:34 PM EDT ----- Regarding: PHONE CALL Patient tried returning you call but could not get through.  He is now in the waiting room hoping to speak with you.

## 2016-11-24 ENCOUNTER — Encounter: Payer: Self-pay | Admitting: Family Medicine

## 2016-12-08 ENCOUNTER — Ambulatory Visit (INDEPENDENT_AMBULATORY_CARE_PROVIDER_SITE_OTHER): Payer: 59 | Admitting: Pharmacist

## 2016-12-08 VITALS — BP 152/90 | HR 71

## 2016-12-08 DIAGNOSIS — I10 Essential (primary) hypertension: Secondary | ICD-10-CM | POA: Diagnosis not present

## 2016-12-08 DIAGNOSIS — I5032 Chronic diastolic (congestive) heart failure: Secondary | ICD-10-CM | POA: Diagnosis not present

## 2016-12-08 MED ORDER — ATENOLOL 50 MG PO TABS: 50.0000 mg | ORAL_TABLET | Freq: Every day | ORAL | 11 refills | Status: DC

## 2016-12-08 NOTE — Patient Instructions (Addendum)
Increase your atenolol to 50mg  each day. You can take 2 of your 25mg  until you run out. Then, pick up new prescription for 50mg  tablets and take 1 each day  Follow up for blood pressure check Monday November 26 at 4 PM  Look for Beano over the counter to help with gas   Look for Excedrin migraine or Tylenol to help with headaches

## 2016-12-08 NOTE — Progress Notes (Signed)
Patient ID: [name]                 DOB: [birth year]                      MRN: [id number]     HPI: Gunther Zawadzki is a 51 y.o. male patient of Dr [name] referred by Estella Husk, PA to HTN clinic. PMH is significant for HTN, HLD, chronic diastolic CHF, CKD s/p right nephrectomy (followed at [facility]), sleep apnea, and neuropathy. At last visit, pt had stopped all of his medications because he felt better off of them and he was experiencing LEE with amlodipine 10mg  daily. His BP was 158/100 and his atenolol 25mg  daily was resumed.  Pt presents today in good spirits. He is tolerating his atenolol and reports adherence to his medications. He is fatigued frequently - pt has OSA and is not wearing CPAP still. He occasionally has headaches on 1 side of his head that he thinks might be due to his fenofibrate. He reports adherence with his fenofibrate. Discussed that elevated BP may also contribute to headaches. Has complaints of gas when he drinks soda or eats beans. He is trying to eat a low sodium diet overall. He drinks 1-2 cups of coffee or tea each day. States he does not have time for exercise with his job - he works 12 hour shifts frequently. He takes his atenolol at night and does not check his BP at home. The swelling in his ankles has improved with discontinuation of amlodipine. He thinks he had swelling on the 5mg  dose as well as the 10mg  dose.   Current HTN meds: atenolol 25mg  daily Previously tried: Amlodipine 5mg  and 10mg  daily - LEE BP goal: <130/19mmHg  Family History: Heart disease in his mother; Heart disease (age of onset: 6) in his brother; Hyperlipidemia in his father, sister, and sister; Hypertension in his mother and sister.   Social History: Denies tobacco, alcohol, and illicit drug use. Moved to [place] from [place] in 2002.  Diet: Wife cooks vegetables and meat. Minimizes eating out at restaurants. Has cut back on salt intake. Drinks coffee or tea in the morning - 1-2 servings a  day.   Exercise: Minimal - busy with work  Home BP readings: Does not own a BP cuff  Wt Readings from Last 3 Encounters:  11/15/16 254 lb 12.8 oz (115.6 kg)  10/07/16 252 lb (114.3 kg)  09/23/16 252 lb (114.3 kg)   BP Readings from Last 3 Encounters:  11/15/16 (!) 158/100  10/07/16 136/82  09/23/16 139/76   Pulse Readings from Last 3 Encounters:  11/15/16 94  10/07/16 74  09/23/16 89    Renal function: CrCl cannot be calculated (Patient's most recent lab result is older than the maximum 21 days allowed.).  Past Medical History:  Diagnosis Date  . Anemia   . Back pain   . Colon polyps    adenomatous  . Elevated cholesterol   . History of echocardiogram    Echo 11/17: EF 65-70, normal wall motion, grade 1 diastolic dysfunction, trivial MR, mild LAE, mild TR  . Hyperlipidemia   . Hypertension    no current bp meds for last 3 months  . Kidney stones 2007  . Otosclerosis of both ears   . PUD (peptic ulcer disease)    Has had unspecified surgery for this  . Sleep apnea     has appt on 09-22-15 to get for CPAP  Current Outpatient Prescriptions on File Prior to Visit  Medication Sig Dispense Refill  . atenolol (TENORMIN) 25 MG tablet Take 1 tablet (25 mg total) by mouth daily. 90 tablet 3  . atorvastatin (LIPITOR) 80 MG tablet Take 1 tablet (80 mg total) by mouth daily. 90 tablet 3  . fenofibrate 160 MG tablet Take 1 tablet (160 mg total) by mouth daily. 90 tablet 2  . Vitamin D, Ergocalciferol, (DRISDOL) 50000 units CAPS capsule Take 1 capsule (50,000 Units total) by mouth every 7 (seven) days. 12 capsule 0   No current facility-administered medications on file prior to visit.     Allergies  Allergen Reactions  . Beef-Derived Products     Cultural preference  . Pegademase Bovine Other (See Comments)    Cultural preference  . Poractant Alfa Other (See Comments)    Cultural preference  . Pork-Derived Products     Cultural preference      Assessment/Plan:  1. Hypertension - BP remains above goal <130/90mmHg. Will increase atenolol to 50mg  daily. Pt does not want to resume amlodipine at low dose since he experienced LEE on 5mg  dose as well as 10mg  dose. Medication options are limited since pt has 1 kidney and SCr has fluctuated between 1.3 and 1.8 over the past year. Will f/u in 1 month in HTN clinic. May need to consider addition of hydralazine in the future.   Kristine Chahal E. Stephane Niemann, PharmD, CPP, [place] [address] [place],  [address] Phone: [phone]; Fax: [phone] 12/08/2016 4:34 PM

## 2016-12-28 ENCOUNTER — Ambulatory Visit: Payer: 59 | Admitting: Family Medicine

## 2016-12-28 NOTE — Progress Notes (Deleted)
No chief complaint on file.   HPI  4 review of systems  Past Medical History:  Diagnosis Date  . Anemia   . Back pain   . Colon polyps    adenomatous  . Elevated cholesterol   . History of echocardiogram    Echo 11/17: EF 65-70, normal wall motion, grade 1 diastolic dysfunction, trivial MR, mild LAE, mild TR  . Hyperlipidemia   . Hypertension    no current bp meds for last 3 months  . Kidney stones 2007  . Otosclerosis of both ears   . PUD (peptic ulcer disease)    Has had unspecified surgery for this  . Sleep apnea     has appt on 09-22-15 to get for CPAP    Current Outpatient Medications  Medication Sig Dispense Refill  . atenolol (TENORMIN) 50 MG tablet Take 1 tablet (50 mg total) by mouth daily. 30 tablet 11  . atorvastatin (LIPITOR) 80 MG tablet Take 1 tablet (80 mg total) by mouth daily. 90 tablet 3  . fenofibrate 160 MG tablet Take 1 tablet (160 mg total) by mouth daily. 90 tablet 2  . Vitamin D, Ergocalciferol, (DRISDOL) 50000 units CAPS capsule Take 1 capsule (50,000 Units total) by mouth every 7 (seven) days. 12 capsule 0   No current facility-administered medications for this visit.     Allergies:  Allergies  Allergen Reactions  . Beef-Derived Products     Cultural preference  . Pegademase Bovine Other (See Comments)    Cultural preference  . Poractant Alfa Other (See Comments)    Cultural preference  . Pork-Derived Products     Cultural preference    Past Surgical History:  Procedure Laterality Date  . SMALL INTESTINE SURGERY    . STAPEDOTOMY  2005   lt ear jan, right ear sept  . surgery for ulcers  1990    Social History   Socioeconomic History  . Marital status: Married    Spouse name: Not on file  . Number of children: 4  . Years of education: Not on file  . Highest education level: Not on file  Social Needs  . Financial resource strain: Not on file  . Food insecurity - worry: Not on file  . Food insecurity - inability: Not on file    . Transportation needs - medical: Not on file  . Transportation needs - non-medical: Not on file  Occupational History    Employer: [employer]  Tobacco Use  . Smoking status: Never Smoker  . Smokeless tobacco: Never Used  Substance and Sexual Activity  . Alcohol use: No  . Drug use: No  . Sexual activity: Not on file  Other Topics Concern  . Not on file  Social History Narrative   Lives at home with wife and family. He is from [place]. Came to the [place] in 2002.    Family History  Problem Relation Age of Onset  . Hyperlipidemia Father   . Heart disease Mother   . Hypertension Mother   . Hypertension Sister   . Heart disease Brother 40       CABG  . Hyperlipidemia Sister   . Hyperlipidemia Sister   . Esophageal cancer Cousin   . Colon cancer Neg Hx   . Stomach cancer Neg Hx   . Rectal cancer Neg Hx      ROS Review of Systems See HPI Constitution: No fevers or chills No malaise No diaphoresis Skin: No rash or itching Eyes: no blurry  vision, no double vision GU: no dysuria or hematuria Neuro: no dizziness or headaches * all others reviewed and negative   Objective: There were no vitals filed for this visit.  Physical Exam  Assessment and Plan There are no diagnoses linked to this encounter.   Delores P [employer]

## 2017-01-04 NOTE — Progress Notes (Signed)
Patient ID: [name]                 DOB: [birth year]                      MRN: [id number]     HPI: [name] is a 51 y.o. male patient of Dr. [name] referred by Estella Husk, PA-C to HTN clinic. PMH is significant for HTN, HLD, chronic diastolic CHF, CKD s/p right nephrectomy (followed at [facility]), sleep apnea, and neuropathy. At last office visit, pt had stopped all of his medications because he felt better off of them and he was experiencing LEE with amlodipine 10mg  daily. His BP was 158/100 and his atenolol 25mg  daily was resumed. At initial HTN clinic visit 12/08/16 pt was tolerating atenolol well and reported adherence. His LEE had resolved since stopping amlodipine but BP remained elevated so the dose was increased to 50mg  daily.   Pt presents in good spirits. Pt states higher dose of atenolol is going well and he reports adherence. Pt denies AE, dizziness, or falls. Pt states he has had a recurring HA but APAP helps. Pt also reports back pain. Pt denies LEE since stopping amlodipine. Pt denies using CPAP at night for OSA. Pt reports that he has cut back on Na in his diet. Pt still does not check BP at home.  Current HTN meds: atenolol 50mg  daily Previously tried: Amlodipine 5mg  and 10mg  daily - LEE BP goal: <130/49mmHg  Family History: Heart disease in his mother; Heart disease (age of onset: 53) in his brother; Hyperlipidemia in his father, sister, and sister; Hypertension in his mother and sister.  Social History: Denies tobacco, alcohol, and illicit drug use. Moved to [place] from [place] in 2002.  Diet: Wife cooks vegetables and meat. Minimizes eating out at restaurants. Has cut back on salt intake. Drinks coffee or tea in the morning - 1-2 servings a day.   Exercise: Minimal - busy with work  Home BP readings: Does not own a BP cuff  Wt Readings from Last 3 Encounters:  11/15/16 254 lb 12.8 oz (115.6 kg)  10/07/16 252 lb (114.3 kg)  09/23/16 252 lb (114.3 kg)   BP  Readings from Last 3 Encounters:  12/08/16 (!) 152/90  11/15/16 (!) 158/100  10/07/16 136/82   Pulse Readings from Last 3 Encounters:  12/08/16 71  11/15/16 94  10/07/16 74    Renal function: CrCl cannot be calculated (Patient's most recent lab result is older than the maximum 21 days allowed.).  Past Medical History:  Diagnosis Date  . Anemia   . Back pain   . Colon polyps    adenomatous  . Elevated cholesterol   . History of echocardiogram    Echo 11/17: EF 65-70, normal wall motion, grade 1 diastolic dysfunction, trivial MR, mild LAE, mild TR  . Hyperlipidemia   . Hypertension    no current bp meds for last 3 months  . Kidney stones 2007  . Otosclerosis of both ears   . PUD (peptic ulcer disease)    Has had unspecified surgery for this  . Sleep apnea     has appt on 09-22-15 to get for CPAP    Current Outpatient Medications on File Prior to Visit  Medication Sig Dispense Refill  . atenolol (TENORMIN) 50 MG tablet Take 1 tablet (50 mg total) by mouth daily. 30 tablet 11  . atorvastatin (LIPITOR) 80 MG tablet Take 1 tablet (80 mg total)  by mouth daily. 90 tablet 3  . fenofibrate 160 MG tablet Take 1 tablet (160 mg total) by mouth daily. 90 tablet 2  . Vitamin D, Ergocalciferol, (DRISDOL) 50000 units CAPS capsule Take 1 capsule (50,000 Units total) by mouth every 7 (seven) days. 12 capsule 0   No current facility-administered medications on file prior to visit.     Allergies  Allergen Reactions  . Beef-Derived Products     Cultural preference  . Pegademase Bovine Other (See Comments)    Cultural preference  . Poractant Alfa Other (See Comments)    Cultural preference  . Pork-Derived Products     Cultural preference     Assessment/Plan:  1. Hypertension - BP remains uncontrolled above goal of <130/80 mmHg. Antihypertensive options limited 2/2 kidney disease s/p right nephrectomy - will continue atenolol 50mg  daily and add hydralazine 25mg  TID. Recommend  titrating hydralazine as needed at future visits, and could consider changing atenolol to carvedilol for further BP lowering. Will f/u in HTN clinic in 4 weeks. Encouraged pt to discuss backpain and HA with PCP.  Arrie Senate, PharmD, BCPS PGY-2 Cardiology Pharmacy Resident Pager: [phone] 01/09/2017

## 2017-01-09 ENCOUNTER — Ambulatory Visit (INDEPENDENT_AMBULATORY_CARE_PROVIDER_SITE_OTHER): Payer: 59 | Admitting: Pharmacist

## 2017-01-09 VITALS — BP 154/84 | HR 73

## 2017-01-09 DIAGNOSIS — I10 Essential (primary) hypertension: Secondary | ICD-10-CM

## 2017-01-09 MED ORDER — HYDRALAZINE HCL 25 MG PO TABS: 25.0000 mg | ORAL_TABLET | Freq: Three times a day (TID) | ORAL | 3 refills | Status: DC

## 2017-01-09 NOTE — Patient Instructions (Signed)
It was great to meet you.  Continue taking atenolol 50mg  once daily.  Start taking hydralazine 25mg  three times daily (morning, lunch, and dinner).  Return to clinic for a blood pressure check in 4 weeks.

## 2017-02-01 ENCOUNTER — Other Ambulatory Visit: Payer: Self-pay

## 2017-02-01 ENCOUNTER — Encounter: Payer: Self-pay | Admitting: Family Medicine

## 2017-02-01 ENCOUNTER — Ambulatory Visit: Payer: BLUE CROSS/BLUE SHIELD | Admitting: Family Medicine

## 2017-02-01 VITALS — BP 140/82 | HR 77 | Temp 98.3°F | Resp 16 | Ht 70.0 in | Wt 254.6 lb

## 2017-02-01 DIAGNOSIS — R51 Headache: Secondary | ICD-10-CM

## 2017-02-01 DIAGNOSIS — I10 Essential (primary) hypertension: Secondary | ICD-10-CM | POA: Diagnosis not present

## 2017-02-01 DIAGNOSIS — G8929 Other chronic pain: Secondary | ICD-10-CM | POA: Diagnosis not present

## 2017-02-01 DIAGNOSIS — M546 Pain in thoracic spine: Secondary | ICD-10-CM | POA: Diagnosis not present

## 2017-02-01 DIAGNOSIS — R519 Headache, unspecified: Secondary | ICD-10-CM

## 2017-02-01 MED ORDER — ACETAMINOPHEN 500 MG PO TABS: 500.0000 mg | ORAL_TABLET | Freq: Four times a day (QID) | ORAL | 0 refills | Status: DC | PRN

## 2017-02-01 MED ORDER — VITAMIN D (ERGOCALCIFEROL) 1.25 MG (50000 UNIT) PO CAPS: 50000.0000 [IU] | ORAL_CAPSULE | ORAL | 0 refills | Status: DC

## 2017-02-01 MED ORDER — SUMATRIPTAN SUCCINATE 50 MG PO TABS: ORAL_TABLET | ORAL | 3 refills | Status: DC

## 2017-02-01 NOTE — Patient Instructions (Addendum)
   IF you received an x-ray today, you will receive an invoice from [facility]. Please contact [facility] at [phone] with questions or concerns regarding your invoice.   IF you received labwork today, you will receive an invoice from [facility]. Please contact [facility] at [phone] with questions or concerns regarding your invoice.   Our billing staff will not be able to assist you with questions regarding bills from these companies.  You will be contacted with the lab results as soon as they are available. The fastest way to get your results is to activate your My Chart account. Instructions are located on the last page of this paperwork. If you have not heard from us regarding the results in 2 weeks, please contact this office.     Exercising to Lose Weight Exercising can help you to lose weight. In order to lose weight through exercise, you need to do vigorous-intensity exercise. You can tell that you are exercising with vigorous intensity if you are breathing very hard and fast and cannot hold a conversation while exercising. Moderate-intensity exercise helps to maintain your current weight. You can tell that you are exercising at a moderate level if you have a higher heart rate and faster breathing, but you are still able to hold a conversation. How often should I exercise? Choose an activity that you enjoy and set realistic goals. Your health care provider can help you to make an activity plan that works for you. Exercise regularly as directed by your health care provider. This may include:  Doing resistance training twice each week, such as: ? Push-ups. ? Sit-ups. ? Lifting weights. ? Using resistance bands.  Doing a given intensity of exercise for a given amount of time. Choose from these options: ? 150 minutes of moderate-intensity exercise every week. ? 75 minutes of vigorous-intensity exercise every week. ? A mix of moderate-intensity and  vigorous-intensity exercise every week.  Children, pregnant women, people who are out of shape, people who are overweight, and older adults may need to consult a health care provider for individual recommendations. If you have any sort of medical condition, be sure to consult your health care provider before starting a new exercise program. What are some activities that can help me to lose weight?  Walking at a rate of at least 4.5 miles an hour.  Jogging or running at a rate of 5 miles per hour.  Biking at a rate of at least 10 miles per hour.  Lap swimming.  Roller-skating or in-line skating.  Cross-country skiing.  Vigorous competitive sports, such as football, basketball, and soccer.  Jumping rope.  Aerobic dancing. How can I be more active in my day-to-day activities?  Use the stairs instead of the elevator.  Take a walk during your lunch break.  If you drive, park your car farther away from work or school.  If you take public transportation, get off one stop early and walk the rest of the way.  Make all of your phone calls while standing up and walking around.  Get up, stretch, and walk around every 30 minutes throughout the day. What guidelines should I follow while exercising?  Do not exercise so much that you hurt yourself, feel dizzy, or get very short of breath.  Consult your health care provider prior to starting a new exercise program.  Wear comfortable clothes and shoes with good support.  Drink plenty of water while you exercise to prevent dehydration or heat stroke. Body water is   lost during exercise and must be replaced.  Work out until you breathe faster and your heart beats faster. This information is not intended to replace advice given to you by your health care provider. Make sure you discuss any questions you have with your health care provider. Document Released: 03/05/2010 Document Revised: 07/09/2015 Document Reviewed: 07/04/2013 Elsevier  Interactive Patient Education  2018 Elsevier Inc.  

## 2017-02-01 NOTE — Progress Notes (Signed)
Chief Complaint  Patient presents with  . Follow-up    vitamin d, per pt medication not helping, cough,rn yesterday and tried otc medicine helped    HPI  [employer] translator id # [id number]  Hypertension: Patient here for follow-up of elevated blood pressure. He is not exercising and is adherent to low salt diet.  Blood pressure is well controlled at home. Cardiac symptoms lower extremity edema. Patient denies chest pain, dyspnea, exertional chest pressure/discomfort, irregular heart beat, near-syncope and orthopnea.  Cardiovascular risk factors: dyslipidemia, hypertension, male gender, obesity (BMI >= 30 kg/m2) and sedentary lifestyle. Use of agents associated with hypertension: none. History of target organ damage: none. BP Readings from Last 3 Encounters:  02/01/17 140/82  01/09/17 (!) 154/84  12/08/16 (!) 152/90   He is not taking the hydralazine   Wt Readings from Last 3 Encounters:  02/01/17 254 lb 9.6 oz (115.5 kg)  11/15/16 254 lb 12.8 oz (115.6 kg)  10/07/16 252 lb (114.3 kg)     He states that he feels like his immune system is weak and cannot resist diseases so any headache or pain stays for a long time He states that when he gets headaches the medications do not seem to work He feels like he is always weak and tired He sleeps throughout the night he gets headaches 4-5 times a month He takes over the counter pain medication tylenol or panadol for the unilateral headache He states that the headache is usually on one side or the other No associated dizziness, confusion, nausea or vomiting Describes that he feels pressure behind the eyes which makes his eyes feel like they are bulging out of his head. He had an eye exam February 2018  MRI LUMBAR SPINE 2014 persistent left sided L5-S1 disc protrusion but some interval desiccation and retraction  Throacic spine 2014 No acute osseous abnormality of the thoracic spine    Past Medical History:  Diagnosis Date  . Anemia     . Back pain   . Colon polyps    adenomatous  . Elevated cholesterol   . History of echocardiogram    Echo 11/17: EF 65-70, normal wall motion, grade 1 diastolic dysfunction, trivial MR, mild LAE, mild TR  . Hyperlipidemia   . Hypertension    no current bp meds for last 3 months  . Kidney stones 2007  . Otosclerosis of both ears   . PUD (peptic ulcer disease)    Has had unspecified surgery for this  . Sleep apnea     has appt on 09-22-15 to get for CPAP    Current Outpatient Medications  Medication Sig Dispense Refill  . atenolol (TENORMIN) 50 MG tablet Take 1 tablet (50 mg total) by mouth daily. 30 tablet 11  . atorvastatin (LIPITOR) 80 MG tablet Take 1 tablet (80 mg total) by mouth daily. 90 tablet 3  . fenofibrate 160 MG tablet Take 1 tablet (160 mg total) by mouth daily. 90 tablet 2  . Vitamin D, Ergocalciferol, (DRISDOL) 50000 units CAPS capsule Take 1 capsule (50,000 Units total) by mouth every 7 (seven) days. 12 capsule 0  . acetaminophen (TYLENOL) 500 MG tablet Take 1 tablet (500 mg total) by mouth every 6 (six) hours as needed for moderate pain. 30 tablet 0  . SUMAtriptan (IMITREX) 50 MG tablet Take at the start of headache. May repeat in 2 hours if headache persists or recurs. 10 tablet 3   No current facility-administered medications for this visit.  Allergies:  Allergies  Allergen Reactions  . Beef-Derived Products     Cultural preference  . Pegademase Bovine Other (See Comments)    Cultural preference  . Poractant Alfa Other (See Comments)    Cultural preference  . Pork-Derived Products     Cultural preference    Past Surgical History:  Procedure Laterality Date  . NEPHRECTOMY  02/18/2011   Procedure: NEPHRECTOMY;  Surgeon: Hanley Ben, MD;  Location: [facility];  Service: Urology;  Laterality: Right;  . SMALL INTESTINE SURGERY    . STAPEDOTOMY  2005   lt ear jan, right ear sept  . surgery for ulcers  1990    Social History   Socioeconomic History   . Marital status: Married    Spouse name: None  . Number of children: 4  . Years of education: None  . Highest education level: None  Social Needs  . Financial resource strain: None  . Food insecurity - worry: None  . Food insecurity - inability: None  . Transportation needs - medical: None  . Transportation needs - non-medical: None  Occupational History    Employer: [employer]  Tobacco Use  . Smoking status: Never Smoker  . Smokeless tobacco: Never Used  Substance and Sexual Activity  . Alcohol use: No  . Drug use: No  . Sexual activity: None  Other Topics Concern  . None  Social History Narrative   Lives at home with wife and family. He is from [place]. Came to the [place] in 2002.    Family History  Problem Relation Age of Onset  . Hyperlipidemia Father   . Heart disease Mother   . Hypertension Mother   . Hypertension Sister   . Heart disease Brother 84       CABG  . Hyperlipidemia Sister   . Hyperlipidemia Sister   . Esophageal cancer Cousin   . Colon cancer Neg Hx   . Stomach cancer Neg Hx   . Rectal cancer Neg Hx      ROS Review of Systems See HPI Constitution: No fevers or chills No malaise No diaphoresis Skin: No rash or itching Eyes: no blurry vision, no double vision GU: no dysuria or hematuria Neuro: no dizziness or headaches  all others reviewed and negative   Objective: Vitals:   02/01/17 1053  BP: 140/82  Pulse: 77  Resp: 16  Temp: 98.3 F (36.8 C)  TempSrc: Oral  SpO2: 99%  Weight: 254 lb 9.6 oz (115.5 kg)  Height: 5\' 10"  (1.778 m)    Physical Exam  Constitutional: He is oriented to person, place, and time. He appears well-developed and well-nourished.  HENT:  Head: Normocephalic and atraumatic.  Eyes: Conjunctivae and EOM are normal.  Neck: Normal range of motion. No thyromegaly present.  Cardiovascular: Normal rate, regular rhythm and normal heart sounds.  No murmur heard. Pulmonary/Chest: Effort normal and breath sounds  normal. No stridor. No respiratory distress.  Neurological: He is alert and oriented to person, place, and time.  Skin: Skin is warm. Capillary refill takes less than 2 seconds.  Psychiatric: He has a normal mood and affect. His behavior is normal. Judgment and thought content normal.   CRANIAL NERVES: CN II: Visual fields are full to confrontation. Fundoscopic exam is normal with sharp discs and no vascular changes. Pupils are round equal and briskly reactive to light. CN III, IV, VI: extraocular movement are normal. No ptosis. CN V: Facial sensation is intact to pinprick in all 3 divisions bilaterally.  Corneal responses are intact.  CN VII: Face is symmetric with normal eye closure and smile. CN VIII: Hearing is normal to rubbing fingers CN IX, X: Palate elevates symmetrically. Phonation is normal. CN XI: Head turning and shoulder shrug are intact CN XII: Tongue is midline with normal movements and no atrophy.   Assessment and Plan [name] was seen today for follow-up.  Diagnoses and all orders for this visit:  Chronic bilateral thoracic back pain- discussed weight loss and physical therapy for back pain Tylenol for pain. Due to CRI and one kidney should avoid nsaids.  -     Ambulatory referral to Physical Therapy  Unilateral headache- discussed trial of imitrex for his headache He should keep a log  His headache has traits of migraines but with some features of tension headaches as well -     Ambulatory referral to Physical Therapy  Essential hypertension- bp in good range off hydralazine  Morbid obesity ([place])- discussed that his weight has been stable for the past year or so. Discussed strategies for weight loss  Other orders -     Cancel: Flu Vaccine QUAD 36+ mos IM -     SUMAtriptan (IMITREX) 50 MG tablet; Take at the start of headache. May repeat in 2 hours if headache persists or recurs. -     acetaminophen (TYLENOL) 500 MG tablet; Take 1 tablet (500 mg total) by mouth every  6 (six) hours as needed for moderate pain. -     Vitamin D, Ergocalciferol, (DRISDOL) 50000 units CAPS capsule; Take 1 capsule (50,000 Units total) by mouth every 7 (seven) days.    A total of 25 minutes were spent face-to-face with the patient during this encounter and over half of that time was spent on counseling and coordination of care.   [place]

## 2017-02-15 ENCOUNTER — Ambulatory Visit: Payer: 59 | Admitting: Pharmacist

## 2017-02-15 NOTE — Progress Notes (Deleted)
Patient ID: [name]                 DOB: 02/20/1965                      MRN: [id number]     HPI: [name] is a 52 y.o. male patient of Dr. Nelsonreferred byMichelle Bonnell Public, PA-Cto HTN clinic.PMH is significant for HTN, HLD, chronic diastolic CHF, CKD s/p right nephrectomy(followed at [facility]), sleep apnea, and neuropathy. At last office visit, pt had stopped all of his medications because he felt better off of them and he was experiencing LEE with amlodipine 10mg  daily. His BP was 158/100 and his atenolol 25mg  daily was resumed. At initial HTN clinic visit 12/08/16 pt was tolerating atenolol well and reported adherence. His LEE had resolved since stopping amlodipine but BP remained elevated so the dose was increased to 50mg  daily. BP remained elevated at follow up visit and pt was started on hydralazine 25mg  TID.  Increase hydralazine or switch atenolol to carvedilol 12.5mg  BID  Pt presents in good spirits. Pt states higher dose of atenolol is going well and he reports adherence. Pt denies AE, dizziness, or falls. Pt states he has had a recurring HA but APAP helps. Pt also reports back pain. Pt denies using CPAP at night for OSA. Pt reports that he has cut back on sodium in his diet. Pt still does not check BP at home.  Current HTN meds:atenolol 50mg  daily Previously tried:Amlodipine5mg  and10mg  daily - LEE BP goal:<130/36mmHg  Family History:Heart disease in his mother; Heart disease (age of onset: 29) in his brother; Hyperlipidemia in his father, sister, and sister; Hypertension in his mother and sister.  Social History:Denies tobacco, alcohol, and illicit drug use. Moved to [place] from [place] in 2002.  Diet:Wife cooks vegetables and meat. Minimizes eating out at restaurants. Has cut back on salt intake. Drinks coffee or tea in the morning - 1-2 servings a day.  Exercise:Minimal - busy with work  Home BP readings:Does not own a BP cuff  Wt Readings from Last 3  Encounters:  02/01/17 254 lb 9.6 oz (115.5 kg)  11/15/16 254 lb 12.8 oz (115.6 kg)  10/07/16 252 lb (114.3 kg)   BP Readings from Last 3 Encounters:  02/01/17 140/82  01/09/17 (!) 154/84  12/08/16 (!) 152/90   Pulse Readings from Last 3 Encounters:  02/01/17 77  01/09/17 73  12/08/16 71    Renal function: CrCl cannot be calculated (Patient's most recent lab result is older than the maximum 21 days allowed.).  Past Medical History:  Diagnosis Date  . Anemia   . Back pain   . Colon polyps    adenomatous  . Elevated cholesterol   . History of echocardiogram    Echo 11/17: EF 65-70, normal wall motion, grade 1 diastolic dysfunction, trivial MR, mild LAE, mild TR  . Hyperlipidemia   . Hypertension    no current bp meds for last 3 months  . Kidney stones 2007  . Otosclerosis of both ears   . PUD (peptic ulcer disease)    Has had unspecified surgery for this  . Sleep apnea     has appt on 09-22-15 to get for CPAP    Current Outpatient Medications on File Prior to Visit  Medication Sig Dispense Refill  . acetaminophen (TYLENOL) 500 MG tablet Take 1 tablet (500 mg total) by mouth every 6 (six) hours as needed for moderate pain. 30 tablet 0  .  atenolol (TENORMIN) 50 MG tablet Take 1 tablet (50 mg total) by mouth daily. 30 tablet 11  . atorvastatin (LIPITOR) 80 MG tablet Take 1 tablet (80 mg total) by mouth daily. 90 tablet 3  . fenofibrate 160 MG tablet Take 1 tablet (160 mg total) by mouth daily. 90 tablet 2  . SUMAtriptan (IMITREX) 50 MG tablet Take at the start of headache. May repeat in 2 hours if headache persists or recurs. 10 tablet 3  . Vitamin D, Ergocalciferol, (DRISDOL) 50000 units CAPS capsule Take 1 capsule (50,000 Units total) by mouth every 7 (seven) days. 12 capsule 0   No current facility-administered medications on file prior to visit.     Allergies  Allergen Reactions  . Beef-Derived Products     Cultural preference  . Pegademase Bovine Other (See  Comments)    Cultural preference  . Poractant Alfa Other (See Comments)    Cultural preference  . Pork-Derived Products     Cultural preference     Assessment/Plan:  1. Hypertension -

## 2017-03-01 DIAGNOSIS — R51 Headache: Secondary | ICD-10-CM | POA: Diagnosis not present

## 2017-03-01 DIAGNOSIS — M546 Pain in thoracic spine: Secondary | ICD-10-CM | POA: Diagnosis not present

## 2017-03-01 DIAGNOSIS — M6281 Muscle weakness (generalized): Secondary | ICD-10-CM | POA: Diagnosis not present

## 2017-03-06 DIAGNOSIS — M6281 Muscle weakness (generalized): Secondary | ICD-10-CM | POA: Diagnosis not present

## 2017-03-06 DIAGNOSIS — R51 Headache: Secondary | ICD-10-CM | POA: Diagnosis not present

## 2017-03-06 DIAGNOSIS — M546 Pain in thoracic spine: Secondary | ICD-10-CM | POA: Diagnosis not present

## 2017-03-15 ENCOUNTER — Encounter: Payer: Self-pay | Admitting: Family Medicine

## 2017-03-15 ENCOUNTER — Other Ambulatory Visit: Payer: Self-pay

## 2017-03-15 ENCOUNTER — Ambulatory Visit: Payer: BLUE CROSS/BLUE SHIELD | Admitting: Family Medicine

## 2017-03-15 VITALS — BP 156/96 | HR 82 | Temp 97.9°F | Resp 16 | Ht 70.0 in | Wt 256.0 lb

## 2017-03-15 DIAGNOSIS — R51 Headache: Secondary | ICD-10-CM

## 2017-03-15 DIAGNOSIS — J320 Chronic maxillary sinusitis: Secondary | ICD-10-CM

## 2017-03-15 DIAGNOSIS — J3089 Other allergic rhinitis: Secondary | ICD-10-CM

## 2017-03-15 DIAGNOSIS — I10 Essential (primary) hypertension: Secondary | ICD-10-CM | POA: Diagnosis not present

## 2017-03-15 DIAGNOSIS — R519 Headache, unspecified: Secondary | ICD-10-CM

## 2017-03-15 DIAGNOSIS — H1011 Acute atopic conjunctivitis, right eye: Secondary | ICD-10-CM | POA: Diagnosis not present

## 2017-03-15 DIAGNOSIS — R6889 Other general symptoms and signs: Secondary | ICD-10-CM | POA: Diagnosis not present

## 2017-03-15 LAB — POC INFLUENZA A&B (BINAX/QUICKVUE)
Influenza A, POC: NEGATIVE
Influenza B, POC: NEGATIVE

## 2017-03-15 MED ORDER — FLUTICASONE PROPIONATE 50 MCG/ACT NA SUSP: 1.0000 | Freq: Every day | NASAL | 2 refills | Status: DC

## 2017-03-15 MED ORDER — AMOXICILLIN-POT CLAVULANATE 875-125 MG PO TABS: 1.0000 | ORAL_TABLET | Freq: Two times a day (BID) | ORAL | 0 refills | Status: DC

## 2017-03-15 MED ORDER — CETIRIZINE HCL 10 MG PO CAPS: 1.0000 | ORAL_CAPSULE | Freq: Every day | ORAL | Status: DC

## 2017-03-15 NOTE — Progress Notes (Signed)
Chief Complaint  Patient presents with  . Influenza    possible, st, ha, runny nose, fever, and fatigued x 3 days.  Taking tylenol/otc sinus meds with no relief.  No flu shot this season    HPI   Sinus issues Pt reports that he has been having sore throat, right red eye, sinus headaches, fatigue, fevers and chills. He has been taking tylenol and otc sinus meds He did not get his flu shot He has runny nose and cough as well   Uncontrolled hypertension BP Readings from Last 3 Encounters:  03/15/17 (!) 156/96  02/01/17 140/82  01/09/17 (!) 154/84    He did not take his medication for bp today He has not eaten Denies chest pains, palpitations or shortness of breath No nausea or vomiting Avoids salty foods    Past Medical History:  Diagnosis Date  . Anemia   . Back pain   . Colon polyps    adenomatous  . Elevated cholesterol   . History of echocardiogram    Echo 11/17: EF 65-70, normal wall motion, grade 1 diastolic dysfunction, trivial MR, mild LAE, mild TR  . Hyperlipidemia   . Hypertension    no current bp meds for last 3 months  . Kidney stones 2007  . Otosclerosis of both ears   . PUD (peptic ulcer disease)    Has had unspecified surgery for this  . Sleep apnea     has appt on 09-22-15 to get for CPAP    Current Outpatient Medications  Medication Sig Dispense Refill  . acetaminophen (TYLENOL) 500 MG tablet Take 1 tablet (500 mg total) by mouth every 6 (six) hours as needed for moderate pain. 30 tablet 0  . atorvastatin (LIPITOR) 80 MG tablet Take 1 tablet (80 mg total) by mouth daily. 90 tablet 3  . fenofibrate 160 MG tablet Take 1 tablet (160 mg total) by mouth daily. 90 tablet 2  . SUMAtriptan (IMITREX) 50 MG tablet Take at the start of headache. May repeat in 2 hours if headache persists or recurs. 10 tablet 3  . amoxicillin-clavulanate (AUGMENTIN) 875-125 MG tablet Take 1 tablet by mouth 2 (two) times daily. 20 tablet 0  . atenolol (TENORMIN) 50 MG tablet  Take 1 tablet (50 mg total) by mouth daily. 30 tablet 11  . Cetirizine HCl (ZYRTEC ALLERGY) 10 MG CAPS Take 1 capsule (10 mg total) by mouth daily. 30 capsule   . fluticasone (FLONASE) 50 MCG/ACT nasal spray Place 1 spray into both nostrils daily. 16 g 2  . Vitamin D, Ergocalciferol, (DRISDOL) 50000 units CAPS capsule Take 1 capsule (50,000 Units total) by mouth every 7 (seven) days. (Patient not taking: Reported on 03/15/2017) 12 capsule 0   No current facility-administered medications for this visit.     Allergies:  Allergies  Allergen Reactions  . Beef-Derived Products     Cultural preference  . Pegademase Bovine Other (See Comments)    Cultural preference  . Poractant Alfa Other (See Comments)    Cultural preference  . Pork-Derived Products     Cultural preference    Past Surgical History:  Procedure Laterality Date  . NEPHRECTOMY  02/18/2011   Procedure: NEPHRECTOMY;  Surgeon: Hanley Ben, MD;  Location: [facility];  Service: Urology;  Laterality: Right;  . SMALL INTESTINE SURGERY    . STAPEDOTOMY  2005   lt ear jan, right ear sept  . surgery for ulcers  1990    Social History   Socioeconomic History  .  Marital status: Married    Spouse name: None  . Number of children: 4  . Years of education: None  . Highest education level: None  Social Needs  . Financial resource strain: None  . Food insecurity - worry: None  . Food insecurity - inability: None  . Transportation needs - medical: None  . Transportation needs - non-medical: None  Occupational History    Employer: [employer]  Tobacco Use  . Smoking status: Never Smoker  . Smokeless tobacco: Never Used  Substance and Sexual Activity  . Alcohol use: No  . Drug use: No  . Sexual activity: None  Other Topics Concern  . None  Social History Narrative   Lives at home with wife and family. He is from [place]. Came to the [place] in 2002.    Family History  Problem Relation Age of Onset  . Hyperlipidemia Father    . Heart disease Mother   . Hypertension Mother   . Hypertension Sister   . Heart disease Brother 32       CABG  . Hyperlipidemia Sister   . Hyperlipidemia Sister   . Esophageal cancer Cousin   . Colon cancer Neg Hx   . Stomach cancer Neg Hx   . Rectal cancer Neg Hx      ROS Review of Systems See HPI No diaphoresis Skin: No rash or itching Eyes: no blurry vision, no double vision GU: no dysuria or hematuria all others reviewed and negative   Objective: Vitals:   03/15/17 1035  BP: (!) 156/96  Pulse: 82  Resp: 16  Temp: 97.9 F (36.6 C)  TempSrc: Oral  SpO2: 98%  Weight: 256 lb (116.1 kg)  Height: 5\' 10"  (1.778 m)    Physical Exam General: alert, oriented, in NAD Head: normocephalic, atraumatic, no sinus tenderness Eyes: EOM intact, no scleral icterus or right conjunctival injection Ears: TM clear bilaterally Nose: mucosa +erythematous, +edematous Throat: no pharyngeal exudate or erythema Lymph: no posterior auricular, submental or cervical lymph adenopathy Heart: normal rate, normal sinus rhythm, no murmurs Lungs: clear to auscultation bilaterally, no wheezing    POC  Influenza negative for A and B   Assessment and Plan Kiet was seen today for influenza.  Diagnoses and all orders for this visit:  Flu-like symptoms -     POC Influenza A&B(BINAX/QUICKVUE)  Chronic maxillary sinusitis- augmentin Discussed common antibiotic side effects  Allergic conjunctivitis of right eye-  Advised zyrtec  Non-seasonal allergic rhinitis due to other allergic trigger- flonase  Uncontrolled hypertension- bp out of control Discussed that he should resume home meds and avoid stimulants in sinus meds  Sinus headache- tylenol prn  Other orders -     Cancel: Flu Vaccine QUAD 36+ mos IM -     amoxicillin-clavulanate (AUGMENTIN) 875-125 MG tablet; Take 1 tablet by mouth 2 (two) times daily. -     fluticasone (FLONASE) 50 MCG/ACT nasal spray; Place 1 spray into both  nostrils daily. -     Cetirizine HCl (ZYRTEC ALLERGY) 10 MG CAPS; Take 1 capsule (10 mg total) by mouth daily.   A total of 25 minutes were spent face-to-face with the patient during this encounter and over half of that time was spent on counseling and coordination of care. -  Discussed his bp and what meds to avoid Reviewed and reconciled meds Provided counseling about antibiotic reactions    Zoe A Nolon Rod

## 2017-03-15 NOTE — Patient Instructions (Addendum)
IF you received an x-ray today, you will receive an invoice from [facility]. Please contact [facility] at [phone] with questions or concerns regarding your invoice.   IF you received labwork today, you will receive an invoice from [facility]. Please contact [facility] at [phone] with questions or concerns regarding your invoice.   Our billing staff will not be able to assist you with questions regarding bills from these companies.  You will be contacted with the lab results as soon as they are available. The fastest way to get your results is to activate your My Chart account. Instructions are located on the last page of this paperwork. If you have not heard from [place] regarding the results in 2 weeks, please contact this office.    Allergic Rhinitis, Adult Allergic rhinitis is an allergic reaction that affects the mucous membrane inside the nose. It causes sneezing, a runny or stuffy nose, and the feeling of mucus going down the back of the throat (postnasal drip). Allergic rhinitis can be mild to severe. There are two types of allergic rhinitis:  Seasonal. This type is also called hay fever. It happens only during certain seasons.  Perennial. This type can happen at any time of the year.  What are the causes? This condition happens when the body's defense system (immune system) responds to certain harmless substances called allergens as though they were germs.  Seasonal allergic rhinitis is triggered by pollen, which can come from grasses, trees, and weeds. Perennial allergic rhinitis may be caused by:  House dust mites.  Pet dander.  Mold spores.  What are the signs or symptoms? Symptoms of this condition include:  Sneezing.  Runny or stuffy nose (nasal congestion).  Postnasal drip.  Itchy nose.  Tearing of the eyes.  Trouble sleeping.  Daytime sleepiness.  How is this diagnosed? This condition may be diagnosed based on:  Your medical  history.  A physical exam.  Tests to check for related conditions, such as: ? Asthma. ? Pink eye. ? Ear infection. ? Upper respiratory infection.  Tests to find out which allergens trigger your symptoms. These may include skin or blood tests.  How is this treated? There is no cure for this condition, but treatment can help control symptoms. Treatment may include:  Taking medicines that block allergy symptoms, such as antihistamines. Medicine may be given as a shot, nasal spray, or pill.  Avoiding the allergen.  Desensitization. This treatment involves getting ongoing shots until your body becomes less sensitive to the allergen. This treatment may be done if other treatments do not help.  If taking medicine and avoiding the allergen does not work, new, stronger medicines may be prescribed.  Follow these instructions at home:  Find out what you are allergic to. Common allergens include smoke, dust, and pollen.  Avoid the things you are allergic to. These are some things you can do to help avoid allergens: ? Replace carpet with wood, tile, or vinyl flooring. Carpet can trap dander and dust. ? Do not smoke. Do not allow smoking in your home. ? Change your heating and air conditioning filter at least once a month. ? During allergy season:  Keep windows closed as much as possible.  Plan outdoor activities when pollen counts are lowest. This is usually during the evening hours.  When coming indoors, change clothing and shower before sitting on furniture or bedding.  Take over-the-counter and prescription medicines only as told by your health care provider.  Keep all  follow-up visits as told by your health care provider. This is important. Contact a health care provider if:  You have a fever.  You develop a persistent cough.  You make whistling sounds when you breathe (you wheeze).  Your symptoms interfere with your normal daily activities. Get help right away if:  You  have shortness of breath. Summary  This condition can be managed by taking medicines as directed and avoiding allergens.  Contact your health care provider if you develop a persistent cough or fever.  During allergy season, keep windows closed as much as possible. This information is not intended to replace advice given to you by your health care provider. Make sure you discuss any questions you have with your health care provider. Document Released: 10/26/2000 Document Revised: 03/10/2016 Document Reviewed: 03/10/2016 Elsevier Interactive Patient Education  [employer].

## 2017-03-21 DIAGNOSIS — M546 Pain in thoracic spine: Secondary | ICD-10-CM | POA: Diagnosis not present

## 2017-03-21 DIAGNOSIS — R51 Headache: Secondary | ICD-10-CM | POA: Diagnosis not present

## 2017-03-21 DIAGNOSIS — M6281 Muscle weakness (generalized): Secondary | ICD-10-CM | POA: Diagnosis not present

## 2017-03-30 DIAGNOSIS — R51 Headache: Secondary | ICD-10-CM | POA: Diagnosis not present

## 2017-03-30 DIAGNOSIS — M546 Pain in thoracic spine: Secondary | ICD-10-CM | POA: Diagnosis not present

## 2017-03-30 DIAGNOSIS — M6281 Muscle weakness (generalized): Secondary | ICD-10-CM | POA: Diagnosis not present

## 2017-04-12 ENCOUNTER — Ambulatory Visit: Payer: BLUE CROSS/BLUE SHIELD | Admitting: Family Medicine

## 2017-04-12 ENCOUNTER — Other Ambulatory Visit: Payer: Self-pay

## 2017-04-12 ENCOUNTER — Encounter: Payer: Self-pay | Admitting: Family Medicine

## 2017-04-12 VITALS — BP 148/82 | HR 74 | Temp 98.7°F | Resp 17 | Ht 70.0 in | Wt 257.0 lb

## 2017-04-12 DIAGNOSIS — R5382 Chronic fatigue, unspecified: Secondary | ICD-10-CM

## 2017-04-12 DIAGNOSIS — I10 Essential (primary) hypertension: Secondary | ICD-10-CM

## 2017-04-12 DIAGNOSIS — E781 Pure hyperglyceridemia: Secondary | ICD-10-CM | POA: Diagnosis not present

## 2017-04-12 DIAGNOSIS — N183 Chronic kidney disease, stage 3 unspecified: Secondary | ICD-10-CM

## 2017-04-12 DIAGNOSIS — E559 Vitamin D deficiency, unspecified: Secondary | ICD-10-CM

## 2017-04-12 DIAGNOSIS — R262 Difficulty in walking, not elsewhere classified: Secondary | ICD-10-CM | POA: Diagnosis not present

## 2017-04-12 DIAGNOSIS — G8929 Other chronic pain: Secondary | ICD-10-CM

## 2017-04-12 DIAGNOSIS — Z23 Encounter for immunization: Secondary | ICD-10-CM

## 2017-04-12 DIAGNOSIS — R51 Headache: Secondary | ICD-10-CM | POA: Diagnosis not present

## 2017-04-12 DIAGNOSIS — M6281 Muscle weakness (generalized): Secondary | ICD-10-CM | POA: Diagnosis not present

## 2017-04-12 DIAGNOSIS — M546 Pain in thoracic spine: Secondary | ICD-10-CM | POA: Diagnosis not present

## 2017-04-12 LAB — COMPREHENSIVE METABOLIC PANEL
A/G RATIO: 1.7 (ref 1.2–2.2)
ALK PHOS: 71 IU/L (ref 39–117)
ALT: 17 IU/L (ref 0–44)
AST: 15 IU/L (ref 0–40)
Albumin: 3.8 g/dL (ref 3.5–5.5)
BUN/Creatinine Ratio: 15 (ref 9–20)
BUN: 27 mg/dL — ABNORMAL HIGH (ref 6–24)
Bilirubin Total: 0.2 mg/dL (ref 0.0–1.2)
CHLORIDE: 104 mmol/L (ref 96–106)
CO2: 22 mmol/L (ref 20–29)
Calcium: 9.1 mg/dL (ref 8.7–10.2)
Creatinine, Ser: 1.81 mg/dL — ABNORMAL HIGH (ref 0.76–1.27)
GFR calc Af Amer: 49 mL/min/{1.73_m2} — ABNORMAL LOW (ref >59)
GFR calc non Af Amer: 42 mL/min/{1.73_m2} — ABNORMAL LOW (ref >59)
GLOBULIN, TOTAL: 2.3 g/dL (ref 1.5–4.5)
Glucose: 92 mg/dL (ref 65–99)
POTASSIUM: 4.9 mmol/L (ref 3.5–5.2)
SODIUM: 139 mmol/L (ref 134–144)
Total Protein: 6.1 g/dL (ref 6.0–8.5)

## 2017-04-12 LAB — LIPID PANEL
CHOLESTEROL TOTAL: 359 mg/dL — AB (ref 100–199)
Chol/HDL Ratio: 15.6 ratio — ABNORMAL HIGH (ref 0.0–5.0)
HDL: 23 mg/dL — ABNORMAL LOW (ref >39)
Triglycerides: 1487 mg/dL (ref 0–149)

## 2017-04-12 NOTE — Patient Instructions (Addendum)
   IF you received an x-ray today, you will receive an invoice from [facility]. Please contact [facility] at [phone] with questions or concerns regarding your invoice.   IF you received labwork today, you will receive an invoice from [facility]. Please contact [facility] at [phone] with questions or concerns regarding your invoice.   Our billing staff will not be able to assist you with questions regarding bills from these companies.  You will be contacted with the lab results as soon as they are available. The fastest way to get your results is to activate your My Chart account. Instructions are located on the last page of this paperwork. If you have not heard from us regarding the results in 2 weeks, please contact this office.     Hypertension Hypertension, commonly called high blood pressure, is when the force of blood pumping through the arteries is too strong. The arteries are the blood vessels that carry blood from the heart throughout the body. Hypertension forces the heart to work harder to pump blood and may cause arteries to become narrow or stiff. Having untreated or uncontrolled hypertension can cause heart attacks, strokes, kidney disease, and other problems. A blood pressure reading consists of a higher number over a lower number. Ideally, your blood pressure should be below 120/80. The first ("top") number is called the systolic pressure. It is a measure of the pressure in your arteries as your heart beats. The second ("bottom") number is called the diastolic pressure. It is a measure of the pressure in your arteries as the heart relaxes. What are the causes? The cause of this condition is not known. What increases the risk? Some risk factors for high blood pressure are under your control. Others are not. Factors you can change  Smoking.  Having type 2 diabetes mellitus, high cholesterol, or both.  Not getting enough exercise or physical  activity.  Being overweight.  Having too much fat, sugar, calories, or salt (sodium) in your diet.  Drinking too much alcohol. Factors that are difficult or impossible to change  Having chronic kidney disease.  Having a family history of high blood pressure.  Age. Risk increases with age.  Race. You may be at higher risk if you are African-American.  Gender. Men are at higher risk than women before age 45. After age 65, women are at higher risk than men.  Having obstructive sleep apnea.  Stress. What are the signs or symptoms? Extremely high blood pressure (hypertensive crisis) may cause:  Headache.  Anxiety.  Shortness of breath.  Nosebleed.  Nausea and vomiting.  Severe chest pain.  Jerky movements you cannot control (seizures).  How is this diagnosed? This condition is diagnosed by measuring your blood pressure while you are seated, with your arm resting on a surface. The cuff of the blood pressure monitor will be placed directly against the skin of your upper arm at the level of your heart. It should be measured at least twice using the same arm. Certain conditions can cause a difference in blood pressure between your right and left arms. Certain factors can cause blood pressure readings to be lower or higher than normal (elevated) for a short period of time:  When your blood pressure is higher when you are in a health care provider's office than when you are at home, this is called white coat hypertension. Most people with this condition do not need medicines.  When your blood pressure is higher at home than when you   are in a health care provider's office, this is called masked hypertension. Most people with this condition may need medicines to control blood pressure.  If you have a high blood pressure reading during one visit or you have normal blood pressure with other risk factors:  You may be asked to return on a different day to have your blood pressure  checked again.  You may be asked to monitor your blood pressure at home for 1 week or longer.  If you are diagnosed with hypertension, you may have other blood or imaging tests to help your health care provider understand your overall risk for other conditions. How is this treated? This condition is treated by making healthy lifestyle changes, such as eating healthy foods, exercising more, and reducing your alcohol intake. Your health care provider may prescribe medicine if lifestyle changes are not enough to get your blood pressure under control, and if:  Your systolic blood pressure is above 130.  Your diastolic blood pressure is above 80.  Your personal target blood pressure may vary depending on your medical conditions, your age, and other factors. Follow these instructions at home: Eating and drinking  Eat a diet that is high in fiber and potassium, and low in sodium, added sugar, and fat. An example eating plan is called the DASH (Dietary Approaches to Stop Hypertension) diet. To eat this way: ? Eat plenty of fresh fruits and vegetables. Try to fill half of your plate at each meal with fruits and vegetables. ? Eat whole grains, such as whole wheat pasta, brown rice, or whole grain bread. Fill about one quarter of your plate with whole grains. ? Eat or drink low-fat dairy products, such as skim milk or low-fat yogurt. ? Avoid fatty cuts of meat, processed or cured meats, and poultry with skin. Fill about one quarter of your plate with lean proteins, such as fish, chicken without skin, beans, eggs, and tofu. ? Avoid premade and processed foods. These tend to be higher in sodium, added sugar, and fat.  Reduce your daily sodium intake. Most people with hypertension should eat less than 1,500 mg of sodium a day.  Limit alcohol intake to no more than 1 drink a day for nonpregnant women and 2 drinks a day for men. One drink equals 12 oz of beer, 5 oz of wine, or 1 oz of hard  liquor. Lifestyle  Work with your health care provider to maintain a healthy body weight or to lose weight. Ask what an ideal weight is for you.  Get at least 30 minutes of exercise that causes your heart to beat faster (aerobic exercise) most days of the week. Activities may include walking, swimming, or biking.  Include exercise to strengthen your muscles (resistance exercise), such as pilates or lifting weights, as part of your weekly exercise routine. Try to do these types of exercises for 30 minutes at least 3 days a week.  Do not use any products that contain nicotine or tobacco, such as cigarettes and e-cigarettes. If you need help quitting, ask your health care provider.  Monitor your blood pressure at home as told by your health care provider.  Keep all follow-up visits as told by your health care provider. This is important. Medicines  Take over-the-counter and prescription medicines only as told by your health care provider. Follow directions carefully. Blood pressure medicines must be taken as prescribed.  Do not skip doses of blood pressure medicine. Doing this puts you at risk for problems and   can make the medicine less effective.  Ask your health care provider about side effects or reactions to medicines that you should watch for. Contact a health care provider if:  You think you are having a reaction to a medicine you are taking.  You have headaches that keep coming back (recurring).  You feel dizzy.  You have swelling in your ankles.  You have trouble with your vision. Get help right away if:  You develop a severe headache or confusion.  You have unusual weakness or numbness.  You feel faint.  You have severe pain in your chest or abdomen.  You vomit repeatedly.  You have trouble breathing. Summary  Hypertension is when the force of blood pumping through your arteries is too strong. If this condition is not controlled, it may put you at risk for serious  complications.  Your personal target blood pressure may vary depending on your medical conditions, your age, and other factors. For most people, a normal blood pressure is less than 120/80.  Hypertension is treated with lifestyle changes, medicines, or a combination of both. Lifestyle changes include weight loss, eating a healthy, low-sodium diet, exercising more, and limiting alcohol. This information is not intended to replace advice given to you by your health care provider. Make sure you discuss any questions you have with your health care provider. Document Released: 01/31/2005 Document Revised: 12/30/2015 Document Reviewed: 12/30/2015 Elsevier Interactive Patient Education  2018 [employer] Inc.  

## 2017-04-12 NOTE — Progress Notes (Signed)
Chief Complaint  Patient presents with  . Hypertension    follow up    HPI   Hypertension: Patient here for follow-up of elevated blood pressure. He is not exercising and is adherent to low salt diet.  Blood pressure is well controlled at home. Cardiac symptoms none. Patient denies chest pain, chest pressure/discomfort, claudication, dyspnea, exertional chest pressure/discomfort, fatigue, irregular heart beat and lower extremity edema.  Cardiovascular risk factors: hypertension, male gender, obesity (BMI >= 30 kg/m2) and sedentary lifestyle. Use of agents associated with hypertension: none.  BP Readings from Last 3 Encounters:  04/12/17 (!) 148/82  03/15/17 (!) 156/96  02/01/17 140/82   He reports that he is fasting and did not take his medications today   CKD stage III He has had stable Cre of 1.81 with normal lytes His nephrologist has not had any concerns He is seen every 1-3 months He does not use NSAIDs He eats a low sodium diet Denies muscle cramps and pain   Chronic bilateral thoracic back pain Reports that he has upper back pain He states that he has been feeling soreness in the upper back He thinks it is worse at the end of the day He cannot take NSAIDs and wanted to know what to do for it.   Past Medical History:  Diagnosis Date  . Anemia   . Back pain   . Colon polyps    adenomatous  . Elevated cholesterol   . History of echocardiogram    Echo 11/17: EF 65-70, normal wall motion, grade 1 diastolic dysfunction, trivial MR, mild LAE, mild TR  . Hyperlipidemia   . Hypertension    no current bp meds for last 3 months  . Kidney stones 2007  . Otosclerosis of both ears   . PUD (peptic ulcer disease)    Has had unspecified surgery for this  . Sleep apnea     has appt on 09-22-15 to get for CPAP    Current Outpatient Medications  Medication Sig Dispense Refill  . acetaminophen (TYLENOL) 500 MG tablet Take 1 tablet (500 mg total) by mouth every 6 (six) hours  as needed for moderate pain. 30 tablet 0  . atorvastatin (LIPITOR) 80 MG tablet Take 1 tablet (80 mg total) by mouth daily. 90 tablet 3  . Cetirizine HCl (ZYRTEC ALLERGY) 10 MG CAPS Take 1 capsule (10 mg total) by mouth daily. 30 capsule   . fenofibrate 160 MG tablet Take 1 tablet (160 mg total) by mouth daily. 90 tablet 2  . SUMAtriptan (IMITREX) 50 MG tablet Take at the start of headache. May repeat in 2 hours if headache persists or recurs. 10 tablet 3  . Vitamin D, Ergocalciferol, (DRISDOL) 50000 units CAPS capsule Take 1 capsule (50,000 Units total) by mouth every 7 (seven) days. 12 capsule 0  . atenolol (TENORMIN) 50 MG tablet Take 1 tablet (50 mg total) by mouth daily. 30 tablet 11  . fluticasone (FLONASE) 50 MCG/ACT nasal spray Place 1 spray into both nostrils daily. 16 g 2   No current facility-administered medications for this visit.     Allergies:  Allergies  Allergen Reactions  . Beef-Derived Products     Cultural preference  . Pegademase Bovine Other (See Comments)    Cultural preference  . Poractant Alfa Other (See Comments)    Cultural preference  . Pork-Derived Products     Cultural preference    Past Surgical History:  Procedure Laterality Date  . NEPHRECTOMY  02/18/2011  Procedure: NEPHRECTOMY;  Surgeon: Hanley Ben, MD;  Location: [facility];  Service: Urology;  Laterality: Right;  . SMALL INTESTINE SURGERY    . STAPEDOTOMY  2005   lt ear jan, right ear sept  . surgery for ulcers  1990    Social History   Socioeconomic History  . Marital status: Married    Spouse name: Not on file  . Number of children: 4  . Years of education: Not on file  . Highest education level: Not on file  Occupational History    Employer: [employer]  . Financial resource strain: Not on file  . Food insecurity:    Worry: Not on file    Inability: Not on file  . Transportation needs:    Medical: Not on file    Non-medical: Not on file  Tobacco Use  .  Smoking status: Never Smoker  . Smokeless tobacco: Never Used  Substance and Sexual Activity  . Alcohol use: No  . Drug use: No  . Sexual activity: Not on file  Lifestyle  . Physical activity:    Days per week: Not on file    Minutes per session: Not on file  . Stress: Not on file  Relationships  . Social connections:    Talks on phone: Not on file    Gets together: Not on file    Attends religious service: Not on file    Active member of club or organization: Not on file    Attends meetings of clubs or organizations: Not on file    Relationship status: Not on file  Other Topics Concern  . Not on file  Social History Narrative   Lives at home with wife and family. He is from [place]. Came to the [place] in 2002.    Family History  Problem Relation Age of Onset  . Hyperlipidemia Father   . Heart disease Mother   . Hypertension Mother   . Hypertension Sister   . Heart disease Brother 100       CABG  . Hyperlipidemia Sister   . Hyperlipidemia Sister   . Esophageal cancer Cousin   . Colon cancer Neg Hx   . Stomach cancer Neg Hx   . Rectal cancer Neg Hx      ROS Review of Systems See HPI Constitution: No fevers or chills No malaise No diaphoresis Skin: No rash or itching Eyes: no blurry vision, no double vision GU: no dysuria or hematuria Neuro: no dizziness or headaches  all others reviewed and negative   Objective: Vitals:   04/12/17 1029  BP: (!) 148/82  Pulse: 74  Resp: 17  Temp: 98.7 F (37.1 C)  TempSrc: Oral  SpO2: 96%  Weight: 257 lb (116.6 kg)  Height: 5\' 10"  (1.778 m)    Physical Exam  Constitutional: He is oriented to person, place, and time. He appears well-developed and well-nourished.  HENT:  Head: Normocephalic and atraumatic.  Eyes: Conjunctivae and EOM are normal.  Neck: Normal range of motion. No thyromegaly present.  Cardiovascular: Normal rate, regular rhythm and normal heart sounds.  Pulmonary/Chest: Effort normal and breath sounds  normal. No stridor. No respiratory distress.  Musculoskeletal:       Thoracic back: He exhibits spasm. He exhibits normal range of motion, no tenderness, no bony tenderness, no swelling, no edema, no deformity, no laceration, no pain and normal pulse.  Neurological: He is alert and oriented to person, place, and time.  Skin: Skin is warm.  Capillary refill takes less than 2 seconds.  Psychiatric: He has a normal mood and affect. His behavior is normal. Judgment and thought content normal.   Lab Results  Component Value Date   CHOL 359 (H) 04/12/2017   HDL 23 (L) 04/12/2017   LDLCALC Comment 04/12/2017   LDLDIRECT 139.0 05/06/2014   TRIG 1,487 (HH) 04/12/2017   CHOLHDL 15.6 (H) 04/12/2017    Assessment and Plan Keonta was seen today for hypertension.  Diagnoses and all orders for this visit:  Uncontrolled hypertension- bp close to goal of <140/90 -     Comprehensive metabolic panel  Hypertriglyceridemia- discuss with Cardiology No improvement -     Lipid panel -     Comprehensive metabolic panel  Flu vaccine need  Vitamin D deficiency- advised continued vitamin D supplementation  -     VITAMIN D 25 Hydroxy (Vit-D Deficiency, Fractures)  Chronic bilateral thoracic back pain- advised topical rubs and stretches, tylenol prn  Chronic fatigue -     CBC  CKD stage III Continue Nephrology recs Avoids nsaid Good bp control   Gailene Youkhana A Larnell Granlund

## 2017-04-13 DIAGNOSIS — M542 Cervicalgia: Secondary | ICD-10-CM | POA: Diagnosis not present

## 2017-04-13 DIAGNOSIS — M6281 Muscle weakness (generalized): Secondary | ICD-10-CM | POA: Diagnosis not present

## 2017-04-13 DIAGNOSIS — M546 Pain in thoracic spine: Secondary | ICD-10-CM | POA: Diagnosis not present

## 2017-04-13 LAB — CBC
Hematocrit: 37.3 % — ABNORMAL LOW (ref 37.5–51.0)
Hemoglobin: 13.2 g/dL (ref 13.0–17.7)
MCH: 28.8 pg (ref 26.6–33.0)
MCHC: 35.4 g/dL (ref 31.5–35.7)
MCV: 81 fL (ref 79–97)
PLATELETS: 209 10*3/uL (ref 150–379)
RBC: 4.59 x10E6/uL (ref 4.14–5.80)
RDW: 14.4 % (ref 12.3–15.4)
WBC: 6.4 10*3/uL (ref 3.4–10.8)

## 2017-04-13 LAB — VITAMIN D 25 HYDROXY (VIT D DEFICIENCY, FRACTURES): VIT D 25 HYDROXY: 11 ng/mL — AB (ref 30.0–100.0)

## 2017-04-26 ENCOUNTER — Encounter: Payer: Self-pay | Admitting: Family Medicine

## 2017-04-26 DIAGNOSIS — M542 Cervicalgia: Secondary | ICD-10-CM | POA: Diagnosis not present

## 2017-04-26 DIAGNOSIS — M6281 Muscle weakness (generalized): Secondary | ICD-10-CM | POA: Diagnosis not present

## 2017-04-26 DIAGNOSIS — R262 Difficulty in walking, not elsewhere classified: Secondary | ICD-10-CM | POA: Diagnosis not present

## 2017-04-26 DIAGNOSIS — R51 Headache: Secondary | ICD-10-CM | POA: Diagnosis not present

## 2017-05-01 DIAGNOSIS — I129 Hypertensive chronic kidney disease with stage 1 through stage 4 chronic kidney disease, or unspecified chronic kidney disease: Secondary | ICD-10-CM | POA: Diagnosis not present

## 2017-05-01 DIAGNOSIS — N2581 Secondary hyperparathyroidism of renal origin: Secondary | ICD-10-CM | POA: Diagnosis not present

## 2017-05-01 DIAGNOSIS — N183 Chronic kidney disease, stage 3 (moderate): Secondary | ICD-10-CM | POA: Diagnosis not present

## 2017-05-01 DIAGNOSIS — R809 Proteinuria, unspecified: Secondary | ICD-10-CM | POA: Diagnosis not present

## 2017-05-01 DIAGNOSIS — D631 Anemia in chronic kidney disease: Secondary | ICD-10-CM | POA: Diagnosis not present

## 2017-05-05 DIAGNOSIS — M542 Cervicalgia: Secondary | ICD-10-CM | POA: Diagnosis not present

## 2017-05-05 DIAGNOSIS — M546 Pain in thoracic spine: Secondary | ICD-10-CM | POA: Diagnosis not present

## 2017-05-05 DIAGNOSIS — M25572 Pain in left ankle and joints of left foot: Secondary | ICD-10-CM | POA: Diagnosis not present

## 2017-05-10 DIAGNOSIS — M542 Cervicalgia: Secondary | ICD-10-CM | POA: Diagnosis not present

## 2017-05-10 DIAGNOSIS — R262 Difficulty in walking, not elsewhere classified: Secondary | ICD-10-CM | POA: Diagnosis not present

## 2017-05-10 DIAGNOSIS — N183 Chronic kidney disease, stage 3 unspecified: Secondary | ICD-10-CM | POA: Insufficient documentation

## 2017-05-10 DIAGNOSIS — M25572 Pain in left ankle and joints of left foot: Secondary | ICD-10-CM | POA: Diagnosis not present

## 2017-05-19 DIAGNOSIS — M542 Cervicalgia: Secondary | ICD-10-CM | POA: Diagnosis not present

## 2017-05-19 DIAGNOSIS — M25572 Pain in left ankle and joints of left foot: Secondary | ICD-10-CM | POA: Diagnosis not present

## 2017-05-19 DIAGNOSIS — M6281 Muscle weakness (generalized): Secondary | ICD-10-CM | POA: Diagnosis not present

## 2017-05-19 DIAGNOSIS — M546 Pain in thoracic spine: Secondary | ICD-10-CM | POA: Diagnosis not present

## 2017-05-24 DIAGNOSIS — M545 Low back pain: Secondary | ICD-10-CM | POA: Diagnosis not present

## 2017-05-24 DIAGNOSIS — M6281 Muscle weakness (generalized): Secondary | ICD-10-CM | POA: Diagnosis not present

## 2017-05-24 DIAGNOSIS — M546 Pain in thoracic spine: Secondary | ICD-10-CM | POA: Diagnosis not present

## 2017-06-02 DIAGNOSIS — M546 Pain in thoracic spine: Secondary | ICD-10-CM | POA: Diagnosis not present

## 2017-06-02 DIAGNOSIS — M545 Low back pain: Secondary | ICD-10-CM | POA: Diagnosis not present

## 2017-06-02 DIAGNOSIS — M6281 Muscle weakness (generalized): Secondary | ICD-10-CM | POA: Diagnosis not present

## 2017-06-16 DIAGNOSIS — R51 Headache: Secondary | ICD-10-CM | POA: Diagnosis not present

## 2017-06-16 DIAGNOSIS — M546 Pain in thoracic spine: Secondary | ICD-10-CM | POA: Diagnosis not present

## 2017-07-05 DIAGNOSIS — R51 Headache: Secondary | ICD-10-CM | POA: Diagnosis not present

## 2017-07-05 DIAGNOSIS — M546 Pain in thoracic spine: Secondary | ICD-10-CM | POA: Diagnosis not present

## 2017-07-05 DIAGNOSIS — M545 Low back pain: Secondary | ICD-10-CM | POA: Diagnosis not present

## 2017-07-05 DIAGNOSIS — M542 Cervicalgia: Secondary | ICD-10-CM | POA: Diagnosis not present

## 2017-07-10 ENCOUNTER — Encounter: Payer: Self-pay | Admitting: Family Medicine

## 2017-09-13 ENCOUNTER — Ambulatory Visit: Payer: Self-pay | Admitting: Family Medicine

## 2017-09-19 DIAGNOSIS — R809 Proteinuria, unspecified: Secondary | ICD-10-CM | POA: Diagnosis not present

## 2017-09-19 DIAGNOSIS — N183 Chronic kidney disease, stage 3 (moderate): Secondary | ICD-10-CM | POA: Diagnosis not present

## 2017-09-19 DIAGNOSIS — N2581 Secondary hyperparathyroidism of renal origin: Secondary | ICD-10-CM | POA: Diagnosis not present

## 2017-09-19 DIAGNOSIS — R5383 Other fatigue: Secondary | ICD-10-CM | POA: Diagnosis not present

## 2017-09-19 DIAGNOSIS — I129 Hypertensive chronic kidney disease with stage 1 through stage 4 chronic kidney disease, or unspecified chronic kidney disease: Secondary | ICD-10-CM | POA: Diagnosis not present

## 2017-09-19 DIAGNOSIS — Z905 Acquired absence of kidney: Secondary | ICD-10-CM | POA: Diagnosis not present

## 2017-09-19 DIAGNOSIS — E291 Testicular hypofunction: Secondary | ICD-10-CM | POA: Diagnosis not present

## 2017-09-19 DIAGNOSIS — D631 Anemia in chronic kidney disease: Secondary | ICD-10-CM | POA: Diagnosis not present

## 2017-10-11 ENCOUNTER — Ambulatory Visit: Payer: BLUE CROSS/BLUE SHIELD | Admitting: Family Medicine

## 2017-10-11 ENCOUNTER — Encounter: Payer: Self-pay | Admitting: Family Medicine

## 2017-10-11 ENCOUNTER — Other Ambulatory Visit: Payer: Self-pay

## 2017-10-11 VITALS — BP 170/100 | HR 69 | Temp 97.8°F | Ht 69.0 in | Wt 251.6 lb

## 2017-10-11 DIAGNOSIS — E781 Pure hyperglyceridemia: Secondary | ICD-10-CM | POA: Diagnosis not present

## 2017-10-11 DIAGNOSIS — R7301 Impaired fasting glucose: Secondary | ICD-10-CM

## 2017-10-11 DIAGNOSIS — I10 Essential (primary) hypertension: Secondary | ICD-10-CM | POA: Diagnosis not present

## 2017-10-11 DIAGNOSIS — N183 Chronic kidney disease, stage 3 unspecified: Secondary | ICD-10-CM

## 2017-10-11 NOTE — Patient Instructions (Addendum)
If you have lab work done today you will be contacted with your lab results within the next 2 weeks.  If you have not heard from [place] then please contact us. The fastest way to get your results is to register for My Chart.   IF you received an x-ray today, you will receive an invoice from [facility]. Please contact [facility] at [phone] with questions or concerns regarding your invoice.   IF you received labwork today, you will receive an invoice from [facility]. Please contact [facility] at [phone] with questions or concerns regarding your invoice.   Our billing staff will not be able to assist you with questions regarding bills from these companies.  You will be contacted with the lab results as soon as they are available. The fastest way to get your results is to activate your My Chart account. Instructions are located on the last page of this paperwork. If you have not heard from [place] regarding the results in 2 weeks, please contact this office.     Food Choices to Lower Your Triglycerides Triglycerides are a type of fat in your blood. High levels of triglycerides can increase the risk of heart disease and stroke. If your triglyceride levels are high, the foods you eat and your eating habits are very important. Choosing the right foods can help lower your triglycerides. What general guidelines do I need to follow?  Lose weight if you are overweight.  Limit or avoid alcohol.  Fill one half of your plate with vegetables and green salads.  Limit fruit to two servings a day. Choose fruit instead of juice.  Make one fourth of your plate whole grains. Look for the word "whole" as the first word in the ingredient list.  Fill one fourth of your plate with lean protein foods.  Enjoy fatty fish (such as salmon, mackerel, sardines, and tuna) three times a week.  Choose healthy fats.  Limit foods high in starch and sugar.  Eat more home-cooked food and less  restaurant, buffet, and fast food.  Limit fried foods.  Cook foods using methods other than frying.  Limit saturated fats.  Check ingredient lists to avoid foods with partially hydrogenated oils (trans fats) in them. What foods can I eat? Grains Whole grains, such as whole wheat or whole grain breads, crackers, cereals, and pasta. Unsweetened oatmeal, bulgur, barley, quinoa, or brown rice. Corn or whole wheat flour tortillas. Vegetables Fresh or frozen vegetables (raw, steamed, roasted, or grilled). Green salads. Fruits All fresh, canned (in natural juice), or frozen fruits. Meat and Other Protein Products Ground beef (85% or leaner), grass-fed beef, or beef trimmed of fat. Skinless chicken or [place]. Ground chicken or [place]. Pork trimmed of fat. All fish and seafood. Eggs. Dried beans, peas, or lentils. Unsalted nuts or seeds. Unsalted canned or dry beans. Dairy Low-fat dairy products, such as skim or 1% milk, 2% or reduced-fat cheeses, low-fat ricotta or cottage cheese, or plain low-fat yogurt. Fats and Oils Tub margarines without trans fats. Light or reduced-fat mayonnaise and salad dressings. Avocado. Safflower, olive, or canola oils. Natural peanut or almond butter. The items listed above may not be a complete list of recommended foods or beverages. Contact your dietitian for more options. What foods are not recommended? Grains White bread. White pasta. White rice. Cornbread. Bagels, pastries, and croissants. Crackers that contain trans fat. Vegetables White potatoes. Corn. Creamed or fried vegetables. Vegetables in a cheese sauce. Fruits Dried fruits. Canned fruit in  light or heavy syrup. Fruit juice. Meat and Other Protein Products Fatty cuts of meat. Ribs, chicken wings, bacon, sausage, bologna, salami, chitterlings, fatback, hot dogs, bratwurst, and packaged luncheon meats. Dairy Whole or 2% milk, cream, half-and-half, and cream cheese. Whole-fat or sweetened yogurt.  Full-fat cheeses. Nondairy creamers and whipped toppings. Processed cheese, cheese spreads, or cheese curds. Sweets and Desserts Corn syrup, sugars, honey, and molasses. Candy. Jam and jelly. Syrup. Sweetened cereals. Cookies, pies, cakes, donuts, muffins, and ice cream. Fats and Oils Butter, stick margarine, lard, shortening, ghee, or bacon fat. Coconut, palm kernel, or palm oils. Beverages Alcohol. Sweetened drinks (such as sodas, lemonade, and fruit drinks or punches). The items listed above may not be a complete list of foods and beverages to avoid. Contact your dietitian for more information. This information is not intended to replace advice given to you by your health care provider. Make sure you discuss any questions you have with your health care provider. Document Released: 11/19/2003 Document Revised: 07/09/2015 Document Reviewed: 12/05/2012 Elsevier Interactive Patient Education  2017 [employer].

## 2017-10-11 NOTE — Progress Notes (Signed)
Chief Complaint  Patient presents with  . chronic condition f/u    vit,   . Medication Refill    imitrex    HPI   Headaches  He states that he gets 3 headaches a month He states that he has been taking imitrex for the headache He reports that he finished physical therapy which helped the headache and he was discharged He had dry needling of the neck He gets headaches that will feel like a band squeezing around the head or it might be only on once side  He takes tylenol for the headaches because he has chronic kidney disease He states that he drinks plenty of water  Hypertension: Patient here for follow-up of elevated blood pressure.  He did not take his blood pressure medication because he is coming to the doctor and "knew we would check his blood pressure and labs".  He is not exercising and is adherent to low salt diet.  Blood pressure is well controlled at home. Cardiac symptoms none. Patient denies chest pain, chest pressure/discomfort, claudication, dyspnea, exertional chest pressure/discomfort, fatigue and irregular heart beat.  Cardiovascular risk factors: dyslipidemia and hypertension. Use of agents associated with hypertension: none. History of target organ damage: chronic kidney disease. Pt reports that he gets swelling in his legs, bloating with pain at the scars when he feels bloated   BP Readings from Last 3 Encounters:  10/11/17 (!) 170/100  04/12/17 (!) 148/82  03/15/17 (!) 156/96    CKD Pt reports that she takes tylenol for pain He denies muscle cramps He is trying to eat better foods and drink more water He sees his Nephrologist often to monitor his kidneys since he is down to only one kidney.   Past Medical History:  Diagnosis Date  . Anemia   . Back pain   . Colon polyps    adenomatous  . Elevated cholesterol   . History of echocardiogram    Echo 11/17: EF 65-70, normal wall motion, grade 1 diastolic dysfunction, trivial MR, mild LAE, mild TR  .  Hyperlipidemia   . Hypertension    no current bp meds for last 3 months  . Kidney stones 2007  . Otosclerosis of both ears   . PUD (peptic ulcer disease)    Has had unspecified surgery for this  . Sleep apnea     has appt on 09-22-15 to get for CPAP    Current Outpatient Medications  Medication Sig Dispense Refill  . acetaminophen (TYLENOL) 500 MG tablet Take 1 tablet (500 mg total) by mouth every 6 (six) hours as needed for moderate pain. 30 tablet 0  . atorvastatin (LIPITOR) 80 MG tablet Take 1 tablet (80 mg total) by mouth daily. 90 tablet 3  . calcitRIOL (ROCALTROL) 0.25 MCG capsule Take 0.25 mcg by mouth daily.  11  . Cetirizine HCl (ZYRTEC ALLERGY) 10 MG CAPS Take 1 capsule (10 mg total) by mouth daily. 30 capsule   . fenofibrate 160 MG tablet Take 1 tablet (160 mg total) by mouth daily. 90 tablet 2  . fluticasone (FLONASE) 50 MCG/ACT nasal spray Place 1 spray into both nostrils daily. 16 g 2  . SUMAtriptan (IMITREX) 50 MG tablet Take at the start of headache. May repeat in 2 hours if headache persists or recurs. 10 tablet 3  . Vitamin D, Ergocalciferol, (DRISDOL) 50000 units CAPS capsule Take 1 capsule (50,000 Units total) by mouth every 7 (seven) days. 12 capsule 0  . atenolol (TENORMIN) 50 MG tablet  Take 1 tablet (50 mg total) by mouth daily. 30 tablet 11   No current facility-administered medications for this visit.     Allergies:  Allergies  Allergen Reactions  . Beef-Derived Products     Cultural preference  . Pegademase Bovine Other (See Comments)    Cultural preference  . Poractant Alfa Other (See Comments)    Cultural preference  . Pork-Derived Products     Cultural preference    Past Surgical History:  Procedure Laterality Date  . NEPHRECTOMY  02/18/2011   Procedure: NEPHRECTOMY;  Surgeon: Hanley Ben, MD;  Location: [facility];  Service: Urology;  Laterality: Right;  . SMALL INTESTINE SURGERY    . STAPEDOTOMY  2005   lt ear jan, right ear sept  . surgery for  ulcers  1990    Social History   Socioeconomic History  . Marital status: Married    Spouse name: Not on file  . Number of children: 4  . Years of education: Not on file  . Highest education level: Not on file  Occupational History    Employer: [employer]  . Financial resource strain: Not on file  . Food insecurity:    Worry: Not on file    Inability: Not on file  . Transportation needs:    Medical: Not on file    Non-medical: Not on file  Tobacco Use  . Smoking status: Never Smoker  . Smokeless tobacco: Never Used  Substance and Sexual Activity  . Alcohol use: No  . Drug use: No  . Sexual activity: Not on file  Lifestyle  . Physical activity:    Days per week: Not on file    Minutes per session: Not on file  . Stress: Not on file  Relationships  . Social connections:    Talks on phone: Not on file    Gets together: Not on file    Attends religious service: Not on file    Active member of club or organization: Not on file    Attends meetings of clubs or organizations: Not on file    Relationship status: Not on file  Other Topics Concern  . Not on file  Social History Narrative   Lives at home with wife and family. He is from [place]. Came to the [place] in 2002.    Family History  Problem Relation Age of Onset  . Hyperlipidemia Father   . Heart disease Mother   . Hypertension Mother   . Hypertension Sister   . Heart disease Brother 56       CABG  . Hyperlipidemia Sister   . Hyperlipidemia Sister   . Esophageal cancer Cousin   . Colon cancer Neg Hx   . Stomach cancer Neg Hx   . Rectal cancer Neg Hx      ROS Review of Systems See HPI Constitution: No fevers or chills No malaise No diaphoresis Skin: No rash or itching Eyes: no blurry vision, no double vision GU: no dysuria or hematuria Neuro: no dizziness or headaches all others reviewed and negative   Objective: Vitals:   10/11/17 1312 10/11/17 1331  BP: (!) 180/110 (!) 170/100   Pulse: 69   Temp: 97.8 F (36.6 C)   TempSrc: Oral   SpO2: 97%   Weight: 251 lb 9.6 oz (114.1 kg)   Height: 5\' 9"  (1.753 m)     Physical Exam  General: Obese male, in NAD Eyes: Conjunctiva normal, EOM intact bilaterally Neck: no jvd,  neck supple Heart: NSR, no murmur Lungs: CTAB, no wheezing Abdomen: numerous scars and a midline large incisional hernia, no evidence of incarceration, nontender, no rebound, no guarding Extremities: no edema, no cyanosis Neuro: normal gait, no tremors Psych: normal affect, speech and judgment Skin: no rashes, no irritation  Assessment and Plan [name] was seen today for chronic condition f/u and medication refill.  Diagnoses and all orders for this visit:  Hypertriglyceridemia- will reassess now that patient is fasting -     Lipid panel -     Hemoglobin A1c  Impaired fasting glucose- CMP showed some elevated glucose, given the triglyceridemia will screen for diabetes -     Hemoglobin A1c  Uncontrolled hypertension- pt did not take his medications today His usual bp is 140s/90s   CKD (chronic kidney disease) stage 3, GFR 30-59 ml/min ([place]) He sees [facility]  Last office note shows that his CKD was due to likely nephrosclerosis  He should avoid NSAIDs and IV contrast Pt only takes tylenol prn    [place]

## 2017-10-12 ENCOUNTER — Telehealth: Payer: Self-pay | Admitting: *Deleted

## 2017-10-12 DIAGNOSIS — E785 Hyperlipidemia, unspecified: Secondary | ICD-10-CM

## 2017-10-12 DIAGNOSIS — N183 Chronic kidney disease, stage 3 (moderate): Secondary | ICD-10-CM | POA: Diagnosis not present

## 2017-10-12 LAB — LIPID PANEL
CHOL/HDL RATIO: 17.6 ratio — AB (ref 0.0–5.0)
Cholesterol, Total: 439 mg/dL — ABNORMAL HIGH (ref 100–199)
HDL: 25 mg/dL — ABNORMAL LOW (ref >39)
TRIGLYCERIDES: 1211 mg/dL — AB (ref 0–149)

## 2017-10-12 LAB — HEMOGLOBIN A1C
Est. average glucose Bld gHb Est-mCnc: 108 mg/dL
HEMOGLOBIN A1C: 5.4 % (ref 4.8–5.6)

## 2017-10-12 NOTE — Telephone Encounter (Signed)
error 

## 2017-10-12 NOTE — Addendum Note (Signed)
Addended by: Gaetano Net on: 10/12/2017 01:35 PM   Modules accepted: Orders

## 2017-10-12 NOTE — Telephone Encounter (Signed)
-----   Message from Imogene Burn, PA-C sent at 10/12/2017  1:07 PM EDT ----- Regarding: RE: triglycerides  I'll refer him to our lipid clinic. Thank you for the f/u!! Anderson Malta, can you make this pt an appt with the lipid clinic? Thanks, Selinda Eon ----- Message ----- From: Forrest Moron, MD Sent: 10/12/2017   8:45 AM EDT To: Imogene Burn, PA-C Subject: triglycerides                                  Hello,  This patient has very high triglycerides even though he is on fenofibrate and lipitor 80mg  Do you have any other suggestions for him?  Delia Chimes MD

## 2017-10-12 NOTE — Telephone Encounter (Signed)
Called pt re: referral to Lipid Clinic. Left a message for him to call back.

## 2017-10-17 NOTE — Telephone Encounter (Signed)
Pt walked in the office today, stating he has tried several times to call back and has been left on hold numerous times, so he decided to stop in. Pt has been made aware of the recommendations of seeing our [facility] for high Tryglycerides.  Pt is agreeable and will take pt to check out to make that appt.

## 2017-10-17 NOTE — Telephone Encounter (Signed)
2nd attempt to reach pt re: Tryglycerides being elevated on dual therapy. Per Ermalinda Barrios, PA-C, pt needs to be referred to Lipid Clinic. Referral is in Epic, just waiting on pt to call back to sign and schedule. Left him another message to call the office.

## 2017-10-17 NOTE — Addendum Note (Signed)
Addended by: Gaetano Net on: 10/17/2017 12:43 PM   Modules accepted: Orders

## 2017-10-20 ENCOUNTER — Encounter: Payer: Self-pay | Admitting: Family Medicine

## 2017-10-31 DIAGNOSIS — R3915 Urgency of urination: Secondary | ICD-10-CM | POA: Diagnosis not present

## 2017-10-31 DIAGNOSIS — N400 Enlarged prostate without lower urinary tract symptoms: Secondary | ICD-10-CM | POA: Diagnosis not present

## 2017-10-31 DIAGNOSIS — E291 Testicular hypofunction: Secondary | ICD-10-CM | POA: Diagnosis not present

## 2017-10-31 DIAGNOSIS — N281 Cyst of kidney, acquired: Secondary | ICD-10-CM | POA: Diagnosis not present

## 2017-11-08 ENCOUNTER — Ambulatory Visit (INDEPENDENT_AMBULATORY_CARE_PROVIDER_SITE_OTHER): Payer: BLUE CROSS/BLUE SHIELD | Admitting: Pharmacist

## 2017-11-08 DIAGNOSIS — E785 Hyperlipidemia, unspecified: Secondary | ICD-10-CM | POA: Diagnosis not present

## 2017-11-08 MED ORDER — ROSUVASTATIN CALCIUM 10 MG PO TABS: 10.0000 mg | ORAL_TABLET | Freq: Every day | ORAL | 3 refills | Status: DC

## 2017-11-08 MED ORDER — ICOSAPENT ETHYL 1 G PO CAPS: 2.0000 g | ORAL_CAPSULE | Freq: Two times a day (BID) | ORAL | 3 refills | Status: DC

## 2017-11-08 NOTE — Patient Instructions (Addendum)
Contact Dr. Nolon Rod office about Vitamin D level and need for recheck or additional supplementation.   STOP atorvastatin and fenofibrate.   START Vascepa 2g (2 capsules) TWICE daily   START rosuvastatin (Crestor) 10mg  daily in the evening  Repeat labs in 2 months.    Cholesterol Cholesterol is a fat. Your body needs a small amount of cholesterol. Cholesterol (plaque) may build up in your blood vessels (arteries). That makes you more likely to have a heart attack or stroke. You cannot feel your cholesterol level. Having a blood test is the only way to find out if your level is high. Keep your test results. Work with your doctor to keep your cholesterol at a good level. What do the results mean?  Total cholesterol is how much cholesterol is in your blood.  LDL is bad cholesterol. This is the type that can build up. Try to have low LDL.  HDL is good cholesterol. It cleans your blood vessels and carries LDL away. Try to have high HDL.  Triglycerides are fat that the body can store or burn for energy. What are good levels of cholesterol?  Total cholesterol below 200.  LDL below 100 is good for people who have health risks. LDL below 70 is good for people who have very high risks.  HDL above 40 is good. It is best to have HDL of 60 or higher.  Triglycerides below 150. How can I lower my cholesterol? Diet Follow your diet program as told by your doctor.  Choose fish, white meat chicken, or [place] that is roasted or baked. Try not to eat red meat, fried foods, sausage, or lunch meats.  Eat lots of fresh fruits and vegetables.  Choose whole grains, beans, pasta, potatoes, and cereals.  Choose olive oil, corn oil, or canola oil. Only use small amounts.  Try not to eat butter, mayonnaise, shortening, or palm kernel oils.  Try not to eat foods with trans fats.  Choose low-fat or nonfat dairy foods. ? Drink skim or nonfat milk. ? Eat low-fat or nonfat yogurt and cheeses. ? Try  not to drink whole milk or cream. ? Try not to eat ice cream, egg yolks, or full-fat cheeses.  Healthy desserts include angel food cake, ginger snaps, animal crackers, hard candy, popsicles, and low-fat or nonfat frozen yogurt. Try not to eat pastries, cakes, pies, and cookies.  Exercise Follow your exercise program as told by your doctor.  Be more active. Try gardening, walking, and taking the stairs.  Ask your doctor about ways that you can be more active.  Medicine  Take over-the-counter and prescription medicines only as told by your doctor. This information is not intended to replace advice given to you by your health care provider. Make sure you discuss any questions you have with your health care provider. Document Released: 04/29/2008 Document Revised: 09/02/2015 Document Reviewed: 08/13/2015 Elsevier Interactive Patient Education  [employer].

## 2017-11-08 NOTE — Progress Notes (Signed)
Patient ID: [name]                 DOB: [birth year]                    MRN: [id number]     HPI: [name] is a 52 y.o. male patient of Dr. [name] that presents today for lipid evaluation.  PMH includes HTN, HLD, chronic diastolic CHF, CKD s/p right nephrectomy(followed at [facility]), sleep apnea, and neuropathy. He has previously been seen in HTN clinic previously.   eGFR is 42 ml/min on most recent panel.   He presents today for discussion of cholesterol. He states he is very tired all the time and needs to be out of work. He requests that I fill out disability information. Advised to call PCP or kidney physician to discuss disability as he believes the way he is feeling is related to his kidney again. He also inquires about his vitamin D level (of note this could be contributing to muscle aches as was previously low). Advised he follow up with PCP for recheck of level.   He states that his TG were high back since 2007. He states he was started on several medications at that time including atorvastatin and fenofibrate. He states that he is taking the atorvastatin and the fenofibrate, but he aches in his muscles due to these medications. He knows it is these medications as he has stopped them before and felt better after stopping them. He also complains of bloating and stomach cramping, which may be related to these medications as well.   Risk Factors: HLD TG goal: <150 LDL goal: <100  Current Medications: atorvastatin 7m daily, fenofibrate 1631mdaily Intolerances: atorvastatin (muscle aches and cramps), fenofibrate (muscle aches)  Family History:Heart disease in his mother; Heart disease (age of onset: 5811in his brother; Hyperlipidemia in his father, sister, and sister; Hypertension in his mother and sister.  Social History:Denies tobacco, alcohol, and illicit drug use. [place]  Diet:Wife cooks vegetables and meat. Minimizes eating out at  restaurants. Has cut back on salt intake. Drinks coffee or tea in the morning - 1-2 servings a day.He eats meat rarely because he feels bad. He rarely eats sweets as this gives him a headache and bloated.   Exercise:Minimal - busy with work  Labs: 10/11/17:  TC 439, TG 1211, HDL 25, unable to calc LDL, nonHDL 414, A1c 5.4 (atorvastatin 8068maily, fenofibrate 160m63mily)  Past Medical History:  Diagnosis Date  . Anemia   . Back pain   . Colon polyps    adenomatous  . Elevated cholesterol   . History of echocardiogram    Echo 11/17: EF 65-70, normal wall motion, grade 1 diastolic dysfunction, trivial MR, mild LAE, mild TR  . Hyperlipidemia   . Hypertension    no current bp meds for last 3 months  . Kidney stones 2007  . Otosclerosis of both ears   . PUD (peptic ulcer disease)    Has had unspecified surgery for this  . Sleep apnea     has appt on 09-22-15 to get for CPAP    Current Outpatient Medications on File Prior to Visit  Medication Sig Dispense Refill  . acetaminophen (TYLENOL) 500 MG tablet Take 1 tablet (500 mg total) by mouth every 6 (six) hours as needed for moderate pain. 30 tablet 0  . atenolol (TENORMIN) 50 MG tablet Take 1 tablet (50 mg total) by  mouth daily. 30 tablet 11  . atorvastatin (LIPITOR) 80 MG tablet Take 1 tablet (80 mg total) by mouth daily. 90 tablet 3  . calcitRIOL (ROCALTROL) 0.25 MCG capsule Take 0.25 mcg by mouth daily.  11  . Cetirizine HCl (ZYRTEC ALLERGY) 10 MG CAPS Take 1 capsule (10 mg total) by mouth daily. 30 capsule   . fenofibrate 160 MG tablet Take 1 tablet (160 mg total) by mouth daily. 90 tablet 2  . fluticasone (FLONASE) 50 MCG/ACT nasal spray Place 1 spray into both nostrils daily. 16 g 2  . SUMAtriptan (IMITREX) 50 MG tablet Take at the start of headache. May repeat in 2 hours if headache persists or recurs. 10 tablet 3  . Vitamin D, Ergocalciferol, (DRISDOL) 50000 units CAPS capsule Take 1 capsule (50,000 Units total) by mouth every  7 (seven) days. 12 capsule 0   No current facility-administered medications on file prior to visit.     Allergies  Allergen Reactions  . Beef-Derived Products     Cultural preference  . Pegademase Bovine Other (See Comments)    Cultural preference  . Poractant Alfa Other (See Comments)    Cultural preference  . Pork-Derived Products     Cultural preference    Assessment/Plan: Hyperlipidemia: TG are markedly elevated despite atorvastatin and fenofibrate therapy. He is experiencing side effects from these medications. Will discontinue both therapies. Will start rosuvastatin 70m daily and Vascepa 2g BID for TG lowering. If fenofibrate needs to be restarted he should be started on the reduced dose due to his kidney function (and nephrectomy). Will recheck cholesterol panel and hepatic panel in 2 months with follow up in lipid clinic. He is aware to call if symptoms persist with medication changes.    Thank you,  KLelan Pons [facility]  11/08/2017 7:09 AM

## 2017-11-23 ENCOUNTER — Telehealth: Payer: Self-pay | Admitting: Cardiology

## 2017-11-23 NOTE — Telephone Encounter (Signed)
New Message:     Pt c/o medication issue:  1. Name of Medication: Icosapent Ethyl (VASCEPA) 1 g CAPS  2. How are you currently taking this medication (dosage and times per day)? Take 2 capsules (2 g total) by mouth 2 (two) times daily.  3. Are you having a reaction (difficulty breathing--STAT)? No  4. What is your medication issue? Patient stated the medication is not covered with his insurance

## 2017-11-23 NOTE — Telephone Encounter (Signed)
Forward to lipid clinic

## 2017-11-24 ENCOUNTER — Telehealth: Payer: Self-pay

## 2017-11-24 MED ORDER — OMEGA-3-ACID ETHYL ESTERS 1 G PO CAPS: 2.0000 g | ORAL_CAPSULE | Freq: Two times a day (BID) | ORAL | 1 refills | Status: DC

## 2017-11-24 NOTE — Telephone Encounter (Signed)
**Note De-Identified  Obfuscation** I have done a Vascepa PA through covermymeds. Key: [id number]

## 2017-11-24 NOTE — Telephone Encounter (Signed)
Will change to Lovaza 2g BID. LMOM to discuss change with patient.

## 2017-11-27 NOTE — Telephone Encounter (Signed)
Letter received via fax from [facility] stating that they have denied coverage of Vascepa. Reason for denial:  The pt must first try and fail Lovaza.  Per phone note from 11/23/17 our pharmacist Georgina Peer has already addressed this: Erskine Emery, [facility]  12:45 PM  Note    Will change to Lovaza 2g BID. LMOM to discuss change with patient

## 2017-12-01 ENCOUNTER — Encounter: Payer: Self-pay | Admitting: Physician Assistant

## 2017-12-01 ENCOUNTER — Ambulatory Visit: Payer: BLUE CROSS/BLUE SHIELD | Admitting: Physician Assistant

## 2017-12-01 VITALS — BP 168/95 | HR 76 | Temp 98.5°F | Resp 17 | Ht 69.0 in | Wt 260.0 lb

## 2017-12-01 DIAGNOSIS — R059 Cough, unspecified: Secondary | ICD-10-CM

## 2017-12-01 DIAGNOSIS — J069 Acute upper respiratory infection, unspecified: Secondary | ICD-10-CM | POA: Diagnosis not present

## 2017-12-01 DIAGNOSIS — R05 Cough: Secondary | ICD-10-CM | POA: Diagnosis not present

## 2017-12-01 MED ORDER — FLUTICASONE PROPIONATE 50 MCG/ACT NA SUSP: 1.0000 | Freq: Every day | NASAL | 2 refills | Status: DC

## 2017-12-01 MED ORDER — HYDROCODONE-HOMATROPINE 5-1.5 MG/5ML PO SYRP: 5.0000 mL | ORAL_SOLUTION | Freq: Three times a day (TID) | ORAL | 0 refills | Status: DC | PRN

## 2017-12-01 MED ORDER — BENZONATATE 100 MG PO CAPS: 100.0000 mg | ORAL_CAPSULE | Freq: Three times a day (TID) | ORAL | 0 refills | Status: DC | PRN

## 2017-12-01 NOTE — Progress Notes (Signed)
[name]  MRN: [id number] DOB: [birth year]  PCP: Forrest Moron, MD  Subjective:  Pt is a 52 year old male who presents to clinic for cold symptoms x 3 days. Endorses cough and runny nose. Cough is keeping him up at night.  He has been taking tylenol  Denies fever, chills, diaphoresis, n/v, shob, wheezing.   Pt  has a past medical history of Anemia, Back pain, Colon polyps, Elevated cholesterol, History of echocardiogram, Hyperlipidemia, Hypertension, Kidney stones (2007), Otosclerosis of both ears, PUD (peptic ulcer disease), and Sleep apnea.  Review of Systems  Constitutional: Negative for chills, diaphoresis, fatigue and fever.  HENT: Positive for congestion, postnasal drip and rhinorrhea. Negative for sinus pressure, sinus pain, sneezing and sore throat.   Respiratory: Positive for cough. Negative for shortness of breath and wheezing.     Patient Active Problem List   Diagnosis Date Noted  . CKD (chronic kidney disease) stage 3, GFR 30-59 ml/min (HCC) 05/10/2017  . Renal insufficiency 11/15/2016  . Acute on chronic diastolic CHF (congestive heart failure) ([place]) 10/07/2016  . Testicular hypofunction 09/15/2015  . BPH without obstruction/lower urinary tract symptoms 09/15/2015  . Bilateral hearing loss 07/03/2015  . Mixed hearing loss of right ear 07/03/2015  . Chest pain 03/29/2013  . Hyperlipidemia 03/29/2013  . Nephrolithiasis 03/14/2013  . Acute blood loss anemia 03/14/2013  . Lumbar radiculopathy 02/26/2013  . Dyspnea 11/25/2011  . Leg pain, left 11/25/2011  . Vitamin D deficiency 08/03/2011  . Constipation 04/26/2011  . HTN (hypertension) 04/10/2010  . HLD (hyperlipidemia) 04/10/2010    Current Outpatient Medications on File Prior to Visit  Medication Sig Dispense Refill  . acetaminophen (TYLENOL) 500 MG tablet Take 1 tablet (500 mg total) by mouth every 6 (six) hours as needed for moderate pain. 30 tablet 0  . calcitRIOL (ROCALTROL) 0.25 MCG capsule Take 0.25  mcg by mouth daily.  11  . Cetirizine HCl (ZYRTEC ALLERGY) 10 MG CAPS Take 1 capsule (10 mg total) by mouth daily. 30 capsule   . fluticasone (FLONASE) 50 MCG/ACT nasal spray Place 1 spray into both nostrils daily. 16 g 2  . omega-3 acid ethyl esters (LOVAZA) 1 g capsule Take 2 capsules (2 g total) by mouth 2 (two) times daily. 360 capsule 1  . rosuvastatin (CRESTOR) 10 MG tablet Take 1 tablet (10 mg total) by mouth daily. 90 tablet 3  . SUMAtriptan (IMITREX) 50 MG tablet Take at the start of headache. May repeat in 2 hours if headache persists or recurs. 10 tablet 3  . Vitamin D, Ergocalciferol, (DRISDOL) 50000 units CAPS capsule Take 1 capsule (50,000 Units total) by mouth every 7 (seven) days. 12 capsule 0  . atenolol (TENORMIN) 50 MG tablet Take 1 tablet (50 mg total) by mouth daily. 30 tablet 11   No current facility-administered medications on file prior to visit.     Allergies  Allergen Reactions  . Beef-Derived Products     Cultural preference  . Pegademase Bovine Other (See Comments)    Cultural preference  . Poractant Alfa Other (See Comments)    Cultural preference  . Pork-Derived Products     Cultural preference     Objective:  BP (!) 168/95   Pulse 76   Temp 98.5 F (36.9 C) (Oral)   Resp 17   Ht 5\' 9"  (1.753 m)   Wt 260 lb (117.9 kg)   SpO2 98%   BMI 38.40 kg/m   Physical Exam  Constitutional: He is oriented  to person, place, and time. He appears well-developed.  Non-toxic appearance. He does not appear ill. No distress.  HENT:  Right Ear: Tympanic membrane normal.  Left Ear: Tympanic membrane normal.  Nose: Mucosal edema present. No rhinorrhea. Right sinus exhibits no maxillary sinus tenderness and no frontal sinus tenderness. Left sinus exhibits no maxillary sinus tenderness and no frontal sinus tenderness.  Mouth/Throat: Oropharynx is clear and moist and mucous membranes are normal.  Cardiovascular: Normal rate, regular rhythm and normal heart sounds.    Pulmonary/Chest: Effort normal and breath sounds normal. No respiratory distress. He has no wheezes. He has no rales.  Neurological: He is alert and oriented to person, place, and time.  Skin: Skin is warm and dry.  Psychiatric: Judgment normal.  Vitals reviewed.   Assessment and Plan :  1. Acute upper respiratory infection 2. Cough - Pt presents c/o cough and nasal congestion x 3 days. Vitals are stable. He is not ill appearing. Plan to treat supportively. RTC in 5-7 days if no improvement.  - benzonatate (TESSALON) 100 MG capsule; Take 1-2 capsules (100-200 mg total) by mouth 3 (three) times daily as needed for cough.  Dispense: 40 capsule; Refill: 0 - HYDROcodone-homatropine (HYCODAN) 5-1.5 MG/5ML syrup; Take 5 mLs by mouth every 8 (eight) hours as needed for cough.  Dispense: 120 mL; Refill: 0 - fluticasone (FLONASE) 50 MCG/ACT nasal spray; Place 1 spray into both nostrils daily.  Dispense: 16 g; Refill: 2   Whitney Ramell Wacha, PA-C  [facility] 12/01/2017 12:56 PM  Please note: Portions of this report may have been transcribed using dragon voice recognition software. Every effort was made to ensure accuracy; however, inadvertent computerized transcription errors may be present.

## 2017-12-01 NOTE — Telephone Encounter (Signed)
Spoke with patient and advised of change. After some discussion, he is aware to take rosuvastatin and omega-3. Will keep follow up as scheduled.

## 2017-12-01 NOTE — Patient Instructions (Addendum)
Try a Neti Pot every morning to help rinse out your nose.  Try Flonase nasal spray at night - this can reduce drainage overnight and into the morning.  For sore throat: ? Gargle with 8 oz of salt water ( tsp of salt per 1 qt of water) as often as every 1-2 hours to soothe your throat.   For your cough:  Tessalon is for cough during the day. This should not make you drowsy.  Hycodan is to help your cough at night. This will make you drowsy. Do not take this with any other medications that make your drowsy.  Mucinex (buy this over the counter. Do not buy D or DM) will help thin out your mucus to help you clear it out. Drink plenty of water while taking this medication.  Drink enough water and fluids to keep your urine clear or pale yellow. Get lost of rest. Wash your hands often.   -Foods that can help speed recovery: honey, garlic, chicken soup, elderberries, green tea.  -Supplements that can help speed recovery: vitamin C, zinc, elderberry extract, quercetin, ginseng, selenium  Advil or ibuprofen for pain. Do not take Aspirin.    Cepacol throat lozenges   For sore throat try using a honey-based tea. Use 3 teaspoons of honey with juice squeezed from half lemon. Place shaved pieces of ginger into 1/2-1 cup of water and warm over stove top. Then mix the ingredients and repeat every 4 hours as needed.  Cough Syrup Recipe: Sweet Lemon & Honey Thyme  Ingredients . a handful of fresh thyme sprigs   . 1 pint of water (2 cups)  . 1/2 cup honey (raw is best, but regular will do)  . 1/2 lemon chopped Instructions 1. Place the lemon in the pint jar and cover with the honey. The honey will macerate the lemons and draw out liquids which taste so delicious! 2. Meanwhile, toss the thyme leaves into a saucepan and cover them with the water. 3. Bring the water to a gentle simmer and reduce it to half, about a cup of tea. 4. When the tea is reduced and cooled a bit, strain the sprigs & leaves, add it  into the pint jar and stir it well. 5. Give it a shake and use a spoonful as needed. 6. Store your homemade cough syrup in the refrigerator for about a month.  What causes a cough?  In adults, common causes of a cough include: ?An infection of the airways or lungs (such as the common cold) ?Postnasal drip - Postnasal drip is when mucus from the nose drips down or flows along the back of the throat. Postnasal drip can happen when people have: .A cold .Allergies .A sinus infection - The sinuses are hollow areas in the bones of the face that open into the nose. ?Lung conditions, like asthma and chronic obstructive pulmonary disease (COPD) - Both of these conditions can make it hard to breathe. COPD is usually caused by smoking. ?Acid reflux - Acid reflux is when the acid that is normally in your stomach backs up into your esophagus (the tube that carries food from your mouth to your stomach). ?A side effect from blood pressure medicines called "ACE inhibitors" ?Smoking cigarettes  Is there anything I can do on my own to get rid of my cough?  Yes. To help get rid of your cough, you can: ?Use a humidifier in your bedroom ?Use an over-the-counter cough medicine, or suck on cough drops or hard  candy ?Stop smoking, if you smoke ?If you have allergies, avoid the things you are allergic to (like pollen, dust, animals, or mold) If you have acid reflux, your doctor or nurse will tell you which lifestyle changes can help reduce symptoms.

## 2017-12-06 DIAGNOSIS — E291 Testicular hypofunction: Secondary | ICD-10-CM | POA: Diagnosis not present

## 2017-12-06 DIAGNOSIS — N400 Enlarged prostate without lower urinary tract symptoms: Secondary | ICD-10-CM | POA: Diagnosis not present

## 2017-12-12 DIAGNOSIS — E291 Testicular hypofunction: Secondary | ICD-10-CM | POA: Diagnosis not present

## 2017-12-12 DIAGNOSIS — N400 Enlarged prostate without lower urinary tract symptoms: Secondary | ICD-10-CM | POA: Diagnosis not present

## 2017-12-15 ENCOUNTER — Ambulatory Visit: Payer: BLUE CROSS/BLUE SHIELD | Admitting: Family Medicine

## 2017-12-15 NOTE — Progress Notes (Deleted)
No chief complaint on file.   HPI  4 review of systems  Past Medical History:  Diagnosis Date  . Anemia   . Back pain   . Colon polyps    adenomatous  . Elevated cholesterol   . History of echocardiogram    Echo 11/17: EF 65-70, normal wall motion, grade 1 diastolic dysfunction, trivial MR, mild LAE, mild TR  . Hyperlipidemia   . Hypertension    no current bp meds for last 3 months  . Kidney stones 2007  . Otosclerosis of both ears   . PUD (peptic ulcer disease)    Has had unspecified surgery for this  . Sleep apnea     has appt on 09-22-15 to get for CPAP    Current Outpatient Medications  Medication Sig Dispense Refill  . acetaminophen (TYLENOL) 500 MG tablet Take 1 tablet (500 mg total) by mouth every 6 (six) hours as needed for moderate pain. 30 tablet 0  . atenolol (TENORMIN) 50 MG tablet Take 1 tablet (50 mg total) by mouth daily. 30 tablet 11  . benzonatate (TESSALON) 100 MG capsule Take 1-2 capsules (100-200 mg total) by mouth 3 (three) times daily as needed for cough. 40 capsule 0  . calcitRIOL (ROCALTROL) 0.25 MCG capsule Take 0.25 mcg by mouth daily.  11  . Cetirizine HCl (ZYRTEC ALLERGY) 10 MG CAPS Take 1 capsule (10 mg total) by mouth daily. 30 capsule   . fluticasone (FLONASE) 50 MCG/ACT nasal spray Place 1 spray into both nostrils daily. 16 g 2  . HYDROcodone-homatropine (HYCODAN) 5-1.5 MG/5ML syrup Take 5 mLs by mouth every 8 (eight) hours as needed for cough. 120 mL 0  . omega-3 acid ethyl esters (LOVAZA) 1 g capsule Take 2 capsules (2 g total) by mouth 2 (two) times daily. 360 capsule 1  . rosuvastatin (CRESTOR) 10 MG tablet Take 1 tablet (10 mg total) by mouth daily. 90 tablet 3  . SUMAtriptan (IMITREX) 50 MG tablet Take at the start of headache. May repeat in 2 hours if headache persists or recurs. 10 tablet 3  . Vitamin D, Ergocalciferol, (DRISDOL) 50000 units CAPS capsule Take 1 capsule (50,000 Units total) by mouth every 7 (seven) days. 12 capsule 0    No current facility-administered medications for this visit.     Allergies:  Allergies  Allergen Reactions  . Beef-Derived Products     Cultural preference  . Pegademase Bovine Other (See Comments)    Cultural preference  . Poractant Alfa Other (See Comments)    Cultural preference  . Pork-Derived Products     Cultural preference    Past Surgical History:  Procedure Laterality Date  . NEPHRECTOMY  02/18/2011   Procedure: NEPHRECTOMY;  Surgeon: Hanley Ben, MD;  Location: [facility];  Service: Urology;  Laterality: Right;  . SMALL INTESTINE SURGERY    . STAPEDOTOMY  2005   lt ear jan, right ear sept  . surgery for ulcers  1990    Social History   Socioeconomic History  . Marital status: Married    Spouse name: Not on file  . Number of children: 4  . Years of education: Not on file  . Highest education level: Not on file  Occupational History    Employer: [employer]  . Financial resource strain: Not on file  . Food insecurity:    Worry: Not on file    Inability: Not on file  . Transportation needs:    Medical: Not on  file    Non-medical: Not on file  Tobacco Use  . Smoking status: Never Smoker  . Smokeless tobacco: Never Used  Substance and Sexual Activity  . Alcohol use: No  . Drug use: No  . Sexual activity: Not on file  Lifestyle  . Physical activity:    Days per week: Not on file    Minutes per session: Not on file  . Stress: Not on file  Relationships  . Social connections:    Talks on phone: Not on file    Gets together: Not on file    Attends religious service: Not on file    Active member of club or organization: Not on file    Attends meetings of clubs or organizations: Not on file    Relationship status: Not on file  Other Topics Concern  . Not on file  Social History Narrative   Lives at home with wife and family. He is from [place]. Came to the [place] in 2002.    Family History  Problem Relation Age of Onset  .  Hyperlipidemia Father   . Heart disease Mother   . Hypertension Mother   . Hypertension Sister   . Heart disease Brother 85       CABG  . Hyperlipidemia Sister   . Hyperlipidemia Sister   . Esophageal cancer Cousin   . Colon cancer Neg Hx   . Stomach cancer Neg Hx   . Rectal cancer Neg Hx      ROS Review of Systems See HPI Constitution: No fevers or chills No malaise No diaphoresis Skin: No rash or itching Eyes: no blurry vision, no double vision GU: no dysuria or hematuria Neuro: no dizziness or headaches * all others reviewed and negative   Objective: There were no vitals filed for this visit.  Physical Exam  Assessment and Plan There are no diagnoses linked to this encounter.   Tiaja Hagan P [employer]

## 2018-01-03 ENCOUNTER — Ambulatory Visit: Payer: BLUE CROSS/BLUE SHIELD | Admitting: Family Medicine

## 2018-01-03 ENCOUNTER — Encounter: Payer: Self-pay | Admitting: Family Medicine

## 2018-01-03 ENCOUNTER — Other Ambulatory Visit: Payer: Self-pay

## 2018-01-03 VITALS — BP 154/92 | HR 73 | Temp 98.1°F | Resp 17 | Ht 69.0 in | Wt 264.0 lb

## 2018-01-03 DIAGNOSIS — N183 Chronic kidney disease, stage 3 unspecified: Secondary | ICD-10-CM

## 2018-01-03 DIAGNOSIS — I5032 Chronic diastolic (congestive) heart failure: Secondary | ICD-10-CM

## 2018-01-03 DIAGNOSIS — G4459 Other complicated headache syndrome: Secondary | ICD-10-CM

## 2018-01-03 DIAGNOSIS — G9340 Encephalopathy, unspecified: Secondary | ICD-10-CM | POA: Diagnosis not present

## 2018-01-03 DIAGNOSIS — I10 Essential (primary) hypertension: Secondary | ICD-10-CM | POA: Diagnosis not present

## 2018-01-03 MED ORDER — ATENOLOL 50 MG PO TABS: 50.0000 mg | ORAL_TABLET | Freq: Two times a day (BID) | ORAL | 1 refills | Status: DC

## 2018-01-03 NOTE — Progress Notes (Signed)
Chief Complaint  Patient presents with  . vitamin d levels per delores  . Headache    intermittent x few days.  pain level 8/10.  Taking tylenol 500 mg x 2   . anxiety disorder and fibromyalgia per pt    HPI Patient reports that he has been having some pain for  He feels weak when he takes all his bp medication and gets lower extremity edema He has headaches over the forehead and behind the eyes He states that he takes his medications but feels very week   BP Readings from Last 3 Encounters:  01/03/18 (!) 154/92  12/01/17 (!) 168/95  10/11/17 (!) 170/100   Using translator #[id number]  He states that he memorized a lot of English words and friends names He gets some confusion with his way home He has not had issues recalling directions, with self care   He reports that he gets pain behind his eyes He has one kidney and sees Urology He also sees Nephrology He has plenty of the atenolol at home   Past Medical History:  Diagnosis Date  . Anemia   . Back pain   . Colon polyps    adenomatous  . Elevated cholesterol   . History of echocardiogram    Echo 11/17: EF 65-70, normal wall motion, grade 1 diastolic dysfunction, trivial MR, mild LAE, mild TR  . Hyperlipidemia   . Hypertension    no current bp meds for last 3 months  . Kidney stones 2007  . Otosclerosis of both ears   . PUD (peptic ulcer disease)    Has had unspecified surgery for this  . Sleep apnea     has appt on 09-22-15 to get for CPAP    Current Outpatient Medications  Medication Sig Dispense Refill  . acetaminophen (TYLENOL) 500 MG tablet Take 1 tablet (500 mg total) by mouth every 6 (six) hours as needed for moderate pain. 30 tablet 0  . atorvastatin (LIPITOR) 80 MG tablet Take 80 mg by mouth daily.    . calcitRIOL (ROCALTROL) 0.25 MCG capsule Take 0.25 mcg by mouth daily.  11  . Cetirizine HCl (ZYRTEC ALLERGY) 10 MG CAPS Take 1 capsule (10 mg total) by mouth daily. 30 capsule   . fenofibrate 160 MG  tablet Take 160 mg by mouth daily.    . fluticasone (FLONASE) 50 MCG/ACT nasal spray Place 1 spray into both nostrils daily. 16 g 2  . HYDROcodone-homatropine (HYCODAN) 5-1.5 MG/5ML syrup Take 5 mLs by mouth every 8 (eight) hours as needed for cough. 120 mL 0  . omega-3 acid ethyl esters (LOVAZA) 1 g capsule Take 2 capsules (2 g total) by mouth 2 (two) times daily. 360 capsule 1  . rosuvastatin (CRESTOR) 10 MG tablet Take 1 tablet (10 mg total) by mouth daily. 90 tablet 3  . SUMAtriptan (IMITREX) 50 MG tablet Take at the start of headache. May repeat in 2 hours if headache persists or recurs. 10 tablet 3  . Vitamin D, Ergocalciferol, (DRISDOL) 50000 units CAPS capsule Take 1 capsule (50,000 Units total) by mouth every 7 (seven) days. 12 capsule 0  . atenolol (TENORMIN) 50 MG tablet Take 1 tablet (50 mg total) by mouth 2 (two) times daily. 60 tablet 1  . benzonatate (TESSALON) 100 MG capsule Take 1-2 capsules (100-200 mg total) by mouth 3 (three) times daily as needed for cough. (Patient not taking: Reported on 01/03/2018) 40 capsule 0  . tamsulosin (FLOMAX) 0.4 MG CAPS capsule Take  1 capsule by mouth at bedtime.  11   No current facility-administered medications for this visit.     Allergies:  Allergies  Allergen Reactions  . Beef-Derived Products     Cultural preference  . Pegademase Bovine Other (See Comments)    Cultural preference  . Poractant Alfa Other (See Comments)    Cultural preference  . Pork-Derived Products     Cultural preference    Past Surgical History:  Procedure Laterality Date  . NEPHRECTOMY  02/18/2011   Procedure: NEPHRECTOMY;  Surgeon: Hanley Ben, MD;  Location: [facility];  Service: Urology;  Laterality: Right;  . SMALL INTESTINE SURGERY    . STAPEDOTOMY  2005   lt ear jan, right ear sept  . surgery for ulcers  1990    Social History   Socioeconomic History  . Marital status: Married    Spouse name: Not on file  . Number of children: 4  . Years of  education: Not on file  . Highest education level: Not on file  Occupational History    Employer: [employer]  . Financial resource strain: Not on file  . Food insecurity:    Worry: Not on file    Inability: Not on file  . Transportation needs:    Medical: Not on file    Non-medical: Not on file  Tobacco Use  . Smoking status: Never Smoker  . Smokeless tobacco: Never Used  Substance and Sexual Activity  . Alcohol use: No  . Drug use: No  . Sexual activity: Not on file  Lifestyle  . Physical activity:    Days per week: Not on file    Minutes per session: Not on file  . Stress: Not on file  Relationships  . Social connections:    Talks on phone: Not on file    Gets together: Not on file    Attends religious service: Not on file    Active member of club or organization: Not on file    Attends meetings of clubs or organizations: Not on file    Relationship status: Not on file  Other Topics Concern  . Not on file  Social History Narrative   Lives at home with wife and family. He is from [place]. Came to the [place] in 2002.    Family History  Problem Relation Age of Onset  . Hyperlipidemia Father   . Heart disease Mother   . Hypertension Mother   . Hypertension Sister   . Heart disease Brother 94       CABG  . Hyperlipidemia Sister   . Hyperlipidemia Sister   . Esophageal cancer Cousin   . Colon cancer Neg Hx   . Stomach cancer Neg Hx   . Rectal cancer Neg Hx      ROS Review of Systems See HPI Constitution: No fevers or chills No malaise No diaphoresis Skin: No rash or itching Eyes: no blurry vision, no double vision GU: no dysuria or hematuria GI: no nausea or vomiting Neuro: no dizziness or headaches all others reviewed and negative   Objective: Vitals:   01/03/18 1456 01/03/18 1622  BP: (!) 173/101 (!) 154/92  Pulse: 73   Resp: 17   Temp: 98.1 F (36.7 C)   TempSrc: Oral   SpO2: 98%   Weight: 264 lb (119.7 kg)   Height: 5\' 9"   (1.753 m)     Physical Exam Physical Exam  Constitutional: He is oriented to person, place, and time.  He appears well-developed and well-nourished. Very good historian HENT:  Head: Normocephalic and atraumatic.  Eyes: Conjunctivae and EOM are normal. Fundoscopic exam without papilledema or visible hemorrhage Cardiovascular: Normal rate, regular rhythm, normal heart sounds and intact distal pulses.  No murmur heard. Pulmonary/Chest: Effort normal and breath sounds normal. No stridor. No respiratory distress. He has no wheezes.  Neurological: He is alert and oriented to person, place, and time. Normal gait, normal CN exam.  Skin: Skin is warm. Capillary refill takes less than 2 seconds.  Psychiatric: He has a normal mood and affect. His behavior is normal. Judgment and thought content normal.   Assessment and Plan Demetruis was seen today for vitamin d levels per delores, headache and anxiety disorder and fibromyalgia per pt.  Diagnoses and all orders for this visit:  Uncontrolled hypertension  Encephalopathy -     MR Brain Wo Contrast; Future  Other complicated headache syndrome -     MR Brain Wo Contrast; Future  CKD (chronic kidney disease) stage 3, GFR 30-59 ml/min (HCC)  Chronic diastolic CHF (congestive heart failure) (HCC) -     atenolol (TENORMIN) 50 MG tablet; Take 1 tablet (50 mg total) by mouth 2 (two) times daily.   Patient has variable compliance as he complains of fatigue Will get MRI and advised pt to double his atenolol since it helps his bp and can help his migraines Advised him to remain off work until his next office visit 01/13/17 Advised pt to take his bp at home Due to his hypertension being uncontrolled MRI needed to evaluate for vascular changes    A Nolon Rod

## 2018-01-03 NOTE — Patient Instructions (Addendum)
Follow up with MRI once scheduled The referral coordinator will get one authorized for you Follow up in the office on 01/13/18 Continue atenolol 50mg  with breakfast and dinner until your blood pressure is less than 140/90  In the mornings when you wake up move slowly Atenolol can cause dizziness      If you have lab work done today you will be contacted with your lab results within the next 2 weeks.  If you have not heard from [place] then please contact us. The fastest way to get your results is to register for My Chart.   IF you received an x-ray today, you will receive an invoice from [facility]. Please contact [facility] at [phone] with questions or concerns regarding your invoice.   IF you received labwork today, you will receive an invoice from [facility]. Please contact [facility] at [phone] with questions or concerns regarding your invoice.   Our billing staff will not be able to assist you with questions regarding bills from these companies.  You will be contacted with the lab results as soon as they are available. The fastest way to get your results is to activate your My Chart account. Instructions are located on the last page of this paperwork. If you have not heard from [place] regarding the results in 2 weeks, please contact this office.     How to Take Your Blood Pressure Blood pressure is a measurement of how strongly your blood is pressing against the walls of your arteries. Arteries are blood vessels that carry blood from your heart throughout your body. Your health care provider takes your blood pressure at each office visit. You can also take your own blood pressure at home with a blood pressure machine. You may need to take your own blood pressure:  To confirm a diagnosis of high blood pressure (hypertension).  To monitor your blood pressure over time.  To make sure your blood pressure medicine is working.  Supplies needed: To take your blood  pressure, you will need a blood pressure machine. You can buy a blood pressure machine, or blood pressure monitor, at most drugstores or online. There are several types of home blood pressure monitors. When choosing one, consider the following:  Choose a monitor that has an arm cuff.  Choose a monitor that wraps snugly around your upper arm. You should be able to fit only one finger between your arm and the cuff.  Do not choose a monitor that measures your blood pressure from your wrist or finger.  Your health care provider can suggest a reliable monitor that will meet your needs. How to prepare To get the most accurate reading, avoid the following for 30 minutes before you check your blood pressure:  Drinking caffeine.  Drinking alcohol.  Eating.  Smoking.  Exercising.  Five minutes before you check your blood pressure:  Empty your bladder.  Sit quietly without talking in a dining chair, rather than in a soft couch or armchair.  How to take your blood pressure To check your blood pressure, follow the instructions in the manual that came with your blood pressure monitor. If you have a digital blood pressure monitor, the instructions may be as follows: 1. Sit up straight. 2. Place your feet on the floor. Do not cross your ankles or legs. 3. Rest your left arm at the level of your heart on a table or desk or on the arm of a chair. 4. Pull up your shirt sleeve. 5. Wrap  the blood pressure cuff around the upper part of your left arm, 1 inch (2.5 cm) above your elbow. It is best to wrap the cuff around bare skin. 6. Fit the cuff snugly around your arm. You should be able to place only one finger between the cuff and your arm. 7. Position the cord inside the groove of your elbow. 8. Press the power button. 9. Sit quietly while the cuff inflates and deflates. 10. Read the digital reading on the monitor screen and write it down (record it). 11. Wait 2-3 minutes, then repeat the steps,  starting at step 1.  What does my blood pressure reading mean? A blood pressure reading consists of a higher number over a lower number. Ideally, your blood pressure should be below 120/80. The first ("top") number is called the systolic pressure. It is a measure of the pressure in your arteries as your heart beats. The second ("bottom") number is called the diastolic pressure. It is a measure of the pressure in your arteries as the heart relaxes. Blood pressure is classified into four stages. The following are the stages for adults who do not have a short-term serious illness or a chronic condition. Systolic pressure and diastolic pressure are measured in a unit called mm Hg. Normal  Systolic pressure: below 151.  Diastolic pressure: below 80. Elevated  Systolic pressure: 761-607.  Diastolic pressure: below 80. Hypertension stage 1  Systolic pressure: 371-062.  Diastolic pressure: 69-48. Hypertension stage 2  Systolic pressure: 546 or above.  Diastolic pressure: 90 or above. You can have prehypertension or hypertension even if only the systolic or only the diastolic number in your reading is higher than normal. Follow these instructions at home:  Check your blood pressure as often as recommended by your health care provider.  Take your monitor to the next appointment with your health care provider to make sure: ? That you are using it correctly. ? That it provides accurate readings.  Be sure you understand what your goal blood pressure numbers are.  Tell your health care provider if you are having any side effects from blood pressure medicine. Contact a health care provider if:  Your blood pressure is consistently high. Get help right away if:  Your systolic blood pressure is higher than 180.  Your diastolic blood pressure is higher than 110. This information is not intended to replace advice given to you by your health care provider. Make sure you discuss any questions  you have with your health care provider. Document Released: 07/10/2015 Document Revised: 09/22/2015 Document Reviewed: 07/10/2015 Elsevier Interactive Patient Education  [employer].

## 2018-01-08 ENCOUNTER — Other Ambulatory Visit: Payer: BLUE CROSS/BLUE SHIELD

## 2018-01-08 NOTE — Progress Notes (Signed)
Patient ID: [name]                 DOB: September 04, 1965                    MRN: [id number]     HPI: [name] is a 52 y.o. male patient of Dr. Meda Barnett that presents today for lipid evaluation.  PMH includes HTN, HLD, chronic diastolic CHF, CKD s/p right nephrectomy(followed at [facility]), sleep apnea, and neuropathy. At last visit with lipid clinic, atorvastatin and fenofibrate were discontinued due to intolerances and rosuvastatin 10mg  daily and Vascepa 2g BID were initiated. Vascepa was switched to generic Lovaza due to insurance restrictions.  Patient arrives today for discussion of follow up lipid panel. Fasting lipid panel was performed this morning instead of yesterday, so unable to discuss results with patient at time of visit today. Patient reports that he has a headache all the time and that he is generally always fatigued. Patient takes acetaminophen for his headaches and he feels that it does not help him at all. He discussed this with his PCP at recent office visit last week and has an MRI scheduled.  Patient states that he feels bad after taking all his medications. He states that if he doesn't eat food with his medications, he has a headache. Patient states that rosuvastatin makes him feel tired and that he feels the same muscle aching that he felt with atorvastatin. He states that he feels better when he is not taking atorvastatin. He reports that Lovaza makes him feel tired and causes his stomach to bloat. He is feeling tired when he is at work but he is ok when he is at home.  Patient reports that he avoids sugar in his drinks and in his foods. He states that carbohydrates cause him to become bloated and give him a headache. Occasionally he drinks juice (lemonade, orange juice) if he has low energy.    Patient states that he does not think that his body needs medications anymore because they make him feel poorly. Patient asks about the process to get disability insurance, as he is  unable to work anymore.   Risk Factors: HTN, CHF, CKD LDL Goal: <100 mg/dL TG Goal: <150 mg/dL  Current Medications: rosuvastatin 10mg  daily, Lovaza 2g BID  Intolerances: atorvastatin 80mg  (myalgia), fenofibrate 160 (myalgia)  Diet: Wife cooks vegetables and meat. Minimizes eating out at restaurants. Has cut back on salt intake. Drinks Barnett or tea in the morning - 1-2 servings a day.He eats meat rarely because he feels bad. He rarely eats sweets as this gives him a headache and bloated.  Exercise: Minimal - busy with work  Family History: Heart disease in his mother; Heart disease (age of onset: 35) in his brother; Hyperlipidemia in his father, sister, and sister; Hypertension in his mother and sister.  Social History: Denies tobacco, alcohol, and illicit drug use. Moved to [place] from [place] in 2002.  Labs: 01/09/18: TC 360, TG 1190, HDL 27, unable to calculate LDL (rosuvastatin 10mg  daily, lovaza 2g BID) - resulted after the end of visit with patient  10/11/17: TC 439, TG 1211, HDL 25, unable to calc LDL, nonHDL 414, A1c 5.4 (atorvastatin 80mg  daily, fenofibrate 160mg  daily)   Past Medical History:  Diagnosis Date  . Anemia   . Back pain   . Colon polyps    adenomatous  . Elevated cholesterol   . History of echocardiogram    Echo 11/17: EF 65-70,  normal wall motion, grade 1 diastolic dysfunction, trivial MR, mild LAE, mild TR  . Hyperlipidemia   . Hypertension    no current bp meds for last 3 months  . Kidney stones 2007  . Otosclerosis of both ears   . PUD (peptic ulcer disease)    Has had unspecified surgery for this  . Sleep apnea     has appt on 09-22-15 to get for CPAP    Current Outpatient Medications on File Prior to Visit  Medication Sig Dispense Refill  . acetaminophen (TYLENOL) 500 MG tablet Take 1 tablet (500 mg total) by mouth every 6 (six) hours as needed for moderate pain. 30 tablet 0  . atenolol (TENORMIN) 50 MG tablet Take 1 tablet (50 mg total) by mouth 2  (two) times daily. 60 tablet 1  . benzonatate (TESSALON) 100 MG capsule Take 1-2 capsules (100-200 mg total) by mouth 3 (three) times daily as needed for cough. (Patient not taking: Reported on 01/03/2018) 40 capsule 0  . calcitRIOL (ROCALTROL) 0.25 MCG capsule Take 0.25 mcg by mouth daily.  11  . Cetirizine HCl (ZYRTEC ALLERGY) 10 MG CAPS Take 1 capsule (10 mg total) by mouth daily. 30 capsule   . fluticasone (FLONASE) 50 MCG/ACT nasal spray Place 1 spray into both nostrils daily. 16 g 2  . HYDROcodone-homatropine (HYCODAN) 5-1.5 MG/5ML syrup Take 5 mLs by mouth every 8 (eight) hours as needed for cough. 120 mL 0  . omega-3 acid ethyl esters (LOVAZA) 1 g capsule Take 2 capsules (2 g total) by mouth 2 (two) times daily. 360 capsule 1  . SUMAtriptan (IMITREX) 50 MG tablet Take at the start of headache. May repeat in 2 hours if headache persists or recurs. 10 tablet 3  . tamsulosin (FLOMAX) 0.4 MG CAPS capsule Take 1 capsule by mouth at bedtime.  11  . Vitamin D, Ergocalciferol, (DRISDOL) 50000 units CAPS capsule Take 1 capsule (50,000 Units total) by mouth every 7 (seven) days. 12 capsule 0   No current facility-administered medications on file prior to visit.     Allergies  Allergen Reactions  . Beef-Derived Products     Cultural preference  . Pegademase Bovine Other (See Comments)    Cultural preference  . Poractant Alfa Other (See Comments)    Cultural preference  . Pork-Derived Products     Cultural preference    Assessment/Plan:  1. Hypertriglyceridemia: Unknown triglyceride levels today, as results of lipid panel have not returned at the time of visit. Unlikely that triglycerides will be decreased to normal in this time. Continue Lovaza 2g BID. As patient is feeling poorly from rosuvastatin 10mg  daily (myalgias), changed statin to pravastatin 20mg  daily. Unlikely that cholesterol medications are causing his chronic fatigue. Stressed to patient that in order to prevent further  complications from elevated triglycerides and LDL, it is imperative that he continue to take his medication as prescribed. Counseled patient to follow up with PCP regarding disability and results of his pending MRI. Will follow up with patient via phone call after results of lipid panel have been received. Follow up with Dr. Meda Barnett as scheduled.   Addendum after visit: Triglycerides remain elevated at 1190 with minimal improvement despite starting Lovaza 2g BID. Called patient to discuss results of lipid panel. Patient was agreeable to a retrial of fenofibrate 48mg  daily for triglyceride lowering. Continue Lovaza and pravastatin. Patient reiterated that he is unsure that medications will help him. He will call us back to schedule follow up labs in ~  3 months. Can consider referral to Dr. Debara Pickett at the [facility] office for further evaluation.  Cleotis Lema, PharmD Student 01/09/18 4:16 PM

## 2018-01-09 ENCOUNTER — Other Ambulatory Visit: Payer: BLUE CROSS/BLUE SHIELD | Admitting: *Deleted

## 2018-01-09 ENCOUNTER — Ambulatory Visit (INDEPENDENT_AMBULATORY_CARE_PROVIDER_SITE_OTHER): Payer: BLUE CROSS/BLUE SHIELD | Admitting: Pharmacist

## 2018-01-09 VITALS — BP 162/84

## 2018-01-09 DIAGNOSIS — E785 Hyperlipidemia, unspecified: Secondary | ICD-10-CM

## 2018-01-09 LAB — LIPID PANEL
Chol/HDL Ratio: 13.3 ratio — ABNORMAL HIGH (ref 0.0–5.0)
Cholesterol, Total: 360 mg/dL — ABNORMAL HIGH (ref 100–199)
HDL: 27 mg/dL — ABNORMAL LOW (ref >39)
Triglycerides: 1190 mg/dL (ref 0–149)

## 2018-01-09 LAB — HEPATIC FUNCTION PANEL
ALT: 15 IU/L (ref 0–44)
AST: 11 IU/L (ref 0–40)
Albumin: 3.6 g/dL (ref 3.5–5.5)
Alkaline Phosphatase: 65 IU/L (ref 39–117)
Bilirubin Total: 0.2 mg/dL (ref 0.0–1.2)
Bilirubin, Direct: 0.08 mg/dL (ref 0.00–0.40)
Total Protein: 5.8 g/dL — ABNORMAL LOW (ref 6.0–8.5)

## 2018-01-09 MED ORDER — FENOFIBRATE 48 MG PO TABS: 48.0000 mg | ORAL_TABLET | Freq: Every day | ORAL | 11 refills | Status: DC

## 2018-01-09 MED ORDER — PRAVASTATIN SODIUM 20 MG PO TABS: 20.0000 mg | ORAL_TABLET | Freq: Every evening | ORAL | 11 refills | Status: DC

## 2018-01-09 NOTE — Patient Instructions (Addendum)
Thank you for coming to see [place] today!  Continue taking Omega-3-acids 2 capsules twice daily.  Stop taking rosuvastatin 10mg  daily.   Start taking pravastatin 20mg  daily. We sent a new prescription to your pharmacy.   Follow up with your PCP after your MRI regarding disability insurance.   If you have any questions or concerns give [place] a call at [phone].

## 2018-01-12 DIAGNOSIS — Z0271 Encounter for disability determination: Secondary | ICD-10-CM

## 2018-01-18 ENCOUNTER — Ambulatory Visit
Admission: RE | Admit: 2018-01-18 | Discharge: 2018-01-18 | Disposition: A | Discharge status: Home / Self Care | Payer: BLUE CROSS/BLUE SHIELD | Source: Ambulatory Visit | Attending: Family Medicine | Admitting: Family Medicine

## 2018-01-18 DIAGNOSIS — R51 Headache: Secondary | ICD-10-CM | POA: Diagnosis not present

## 2018-01-18 DIAGNOSIS — G9340 Encephalopathy, unspecified: Secondary | ICD-10-CM

## 2018-01-18 DIAGNOSIS — G4459 Other complicated headache syndrome: Secondary | ICD-10-CM

## 2018-01-22 ENCOUNTER — Other Ambulatory Visit: Payer: Self-pay

## 2018-01-22 ENCOUNTER — Encounter: Payer: Self-pay | Admitting: Family Medicine

## 2018-01-22 ENCOUNTER — Ambulatory Visit: Payer: BLUE CROSS/BLUE SHIELD | Admitting: Family Medicine

## 2018-01-22 VITALS — BP 172/104 | HR 63 | Temp 98.5°F | Ht 69.0 in | Wt 264.4 lb

## 2018-01-22 DIAGNOSIS — G8929 Other chronic pain: Secondary | ICD-10-CM

## 2018-01-22 DIAGNOSIS — I10 Essential (primary) hypertension: Secondary | ICD-10-CM

## 2018-01-22 DIAGNOSIS — N183 Chronic kidney disease, stage 3 unspecified: Secondary | ICD-10-CM

## 2018-01-22 DIAGNOSIS — R51 Headache: Secondary | ICD-10-CM

## 2018-01-22 NOTE — Patient Instructions (Signed)
° ° ° °  If you have lab work done today you will be contacted with your lab results within the next 2 weeks.  If you have not heard from us then please contact us. The fastest way to get your results is to register for My Chart. ° ° °IF you received an x-ray today, you will receive an invoice from [facility]. Please contact [facility] at [phone] with questions or concerns regarding your invoice.  ° °IF you received labwork today, you will receive an invoice from [facility]. Please contact [facility] at [phone] with questions or concerns regarding your invoice.  ° °Our billing staff will not be able to assist you with questions regarding bills from these companies. ° °You will be contacted with the lab results as soon as they are available. The fastest way to get your results is to activate your My Chart account. Instructions are located on the last page of this paperwork. If you have not heard from us regarding the results in 2 weeks, please contact this office. °  ° ° ° °

## 2018-01-22 NOTE — Progress Notes (Signed)
Established Patient Office Visit  Subjective:  Patient ID: [name], male    DOB: [birth year]  Age: 52 y.o. MRN: [id number]  CC:  Chief Complaint  Patient presents with  . Hypertension    f/u   . return to work    FMLA form     HPI Traevion Poehler presents for follow up on his MRI Pt had MRI brain which was normal He was sent for MRI because of his complaints of chronic headaches He has chronic elevated bp and sees Cardiology and Nephrology so the MRI was ordered to see if there was any aneurysm or white matter disease Pt is currently complaining of headaches 2-3 times a week He was given FMLA which end on 01/23/18      Past Medical History:  Diagnosis Date  . Anemia   . Back pain   . Colon polyps    adenomatous  . Elevated cholesterol   . History of echocardiogram    Echo 11/17: EF 65-70, normal wall motion, grade 1 diastolic dysfunction, trivial MR, mild LAE, mild TR  . Hyperlipidemia   . Hypertension    no current bp meds for last 3 months  . Kidney stones 2007  . Otosclerosis of both ears   . PUD (peptic ulcer disease)    Has had unspecified surgery for this  . Sleep apnea     has appt on 09-22-15 to get for CPAP    Past Surgical History:  Procedure Laterality Date  . NEPHRECTOMY  02/18/2011   Procedure: NEPHRECTOMY;  Surgeon: Hanley Ben, MD;  Location: [facility];  Service: Urology;  Laterality: Right;  . SMALL INTESTINE SURGERY    . STAPEDOTOMY  2005   lt ear jan, right ear sept  . surgery for ulcers  1990    Family History  Problem Relation Age of Onset  . Hyperlipidemia Father   . Heart disease Mother   . Hypertension Mother   . Hypertension Sister   . Heart disease Brother 72       CABG  . Hyperlipidemia Sister   . Hyperlipidemia Sister   . Esophageal cancer Cousin   . Colon cancer Neg Hx   . Stomach cancer Neg Hx   . Rectal cancer Neg Hx     Social History   Socioeconomic History  . Marital status: Married    Spouse name: Not on file  .  Number of children: 4  . Years of education: Not on file  . Highest education level: Not on file  Occupational History    Employer: [employer]  . Financial resource strain: Not on file  . Food insecurity:    Worry: Not on file    Inability: Not on file  . Transportation needs:    Medical: Not on file    Non-medical: Not on file  Tobacco Use  . Smoking status: Never Smoker  . Smokeless tobacco: Never Used  Substance and Sexual Activity  . Alcohol use: No  . Drug use: No  . Sexual activity: Not on file  Lifestyle  . Physical activity:    Days per week: Not on file    Minutes per session: Not on file  . Stress: Not on file  Relationships  . Social connections:    Talks on phone: Not on file    Gets together: Not on file    Attends religious service: Not on file    Active member of club or organization: Not  on file    Attends meetings of clubs or organizations: Not on file    Relationship status: Not on file  . Intimate partner violence:    Fear of current or ex partner: Not on file    Emotionally abused: Not on file    Physically abused: Not on file    Forced sexual activity: Not on file  Other Topics Concern  . Not on file  Social History Narrative   Lives at home with wife and family. He is from [place]. Came to the [place] in 2002.    Outpatient Medications Prior to Visit  Medication Sig Dispense Refill  . atenolol (TENORMIN) 50 MG tablet Take 1 tablet (50 mg total) by mouth 2 (two) times daily. 60 tablet 1  . calcitRIOL (ROCALTROL) 0.25 MCG capsule Take 0.25 mcg by mouth daily.  11  . Cetirizine HCl (ZYRTEC ALLERGY) 10 MG CAPS Take 1 capsule (10 mg total) by mouth daily. 30 capsule   . fenofibrate (TRICOR) 48 MG tablet Take 1 tablet (48 mg total) by mouth daily. 30 tablet 11  . fluticasone (FLONASE) 50 MCG/ACT nasal spray Place 1 spray into both nostrils daily. 16 g 2  . omega-3 acid ethyl esters (LOVAZA) 1 g capsule Take 2 capsules (2 g total) by  mouth 2 (two) times daily. 360 capsule 1  . pravastatin (PRAVACHOL) 20 MG tablet Take 1 tablet (20 mg total) by mouth every evening. 30 tablet 11  . SUMAtriptan (IMITREX) 50 MG tablet Take at the start of headache. May repeat in 2 hours if headache persists or recurs. 10 tablet 3  . tamsulosin (FLOMAX) 0.4 MG CAPS capsule Take 1 capsule by mouth at bedtime.  11  . Vitamin D, Ergocalciferol, (DRISDOL) 50000 units CAPS capsule Take 1 capsule (50,000 Units total) by mouth every 7 (seven) days. 12 capsule 0  . HYDROcodone-homatropine (HYCODAN) 5-1.5 MG/5ML syrup Take 5 mLs by mouth every 8 (eight) hours as needed for cough. 120 mL 0  . acetaminophen (TYLENOL) 500 MG tablet Take 1 tablet (500 mg total) by mouth every 6 (six) hours as needed for moderate pain. (Patient not taking: Reported on 01/22/2018) 30 tablet 0  . benzonatate (TESSALON) 100 MG capsule Take 1-2 capsules (100-200 mg total) by mouth 3 (three) times daily as needed for cough. (Patient not taking: Reported on 01/03/2018) 40 capsule 0   No facility-administered medications prior to visit.     Allergies  Allergen Reactions  . Beef-Derived Products     Cultural preference  . Pegademase Bovine Other (See Comments)    Cultural preference  . Poractant Alfa Other (See Comments)    Cultural preference  . Pork-Derived Products     Cultural preference    ROS Review of Systems    Objective:    Physical Exam  BP (!) 172/104 (BP Location: Left Arm, Patient Position: Sitting, Cuff Size: Large)   Pulse 63   Temp 98.5 F (36.9 C) (Oral)   Ht 5\' 9"  (1.753 m)   Wt 264 lb 6.4 oz (119.9 kg)   SpO2 100%   BMI 39.05 kg/m  Wt Readings from Last 3 Encounters:  01/22/18 264 lb 6.4 oz (119.9 kg)  01/03/18 264 lb (119.7 kg)  12/01/17 260 lb (117.9 kg)   Physical Exam  Constitutional: Oriented to person, place, and time. Appears well-developed and well-nourished.  HENT:  Head: Normocephalic and atraumatic.  Eyes: Conjunctivae and EOM  are normal.  Cardiovascular: Normal rate, regular rhythm, normal heart sounds  and intact distal pulses.  No murmur heard. Pulmonary/Chest: Effort normal and breath sounds normal. No stridor. No respiratory distress. Has no wheezes.  Neurological: Is alert and oriented to person, place, and time.  Skin: Skin is warm. Capillary refill takes less than 2 seconds.  Psychiatric: Has a normal mood and affect. Behavior is normal. Judgment and thought content normal. '   There are no preventive care reminders to display for this patient.  There are no preventive care reminders to display for this patient.  Lab Results  Component Value Date   TSH 1.200 09/23/2016   Lab Results  Component Value Date   WBC 6.4 04/12/2017   HGB 13.2 04/12/2017   HCT 37.3 (L) 04/12/2017   MCV 81 04/12/2017   PLT 209 04/12/2017   Lab Results  Component Value Date   NA 139 04/12/2017   K 4.9 04/12/2017   CO2 22 04/12/2017   GLUCOSE 92 04/12/2017   BUN 27 (H) 04/12/2017   CREATININE 1.81 (H) 04/12/2017   BILITOT 0.2 01/09/2018   ALKPHOS 65 01/09/2018   AST 11 01/09/2018   ALT 15 01/09/2018   PROT 5.8 (L) 01/09/2018   ALBUMIN 3.6 01/09/2018   CALCIUM 9.1 04/12/2017   GFR 54.07 (L) 05/06/2014   Lab Results  Component Value Date   CHOL 360 (H) 01/09/2018   Lab Results  Component Value Date   HDL 27 (L) 01/09/2018   Lab Results  Component Value Date   LDLCALC Comment 01/09/2018   Lab Results  Component Value Date   TRIG 1,190 ([place]) 01/09/2018   Lab Results  Component Value Date   CHOLHDL 13.3 (H) 01/09/2018   Lab Results  Component Value Date   HGBA1C 5.4 10/11/2017   MRI HEAD WITHOUT CONTRAST  TECHNIQUE: Multiplanar, multiecho pulse sequences of the brain and surrounding structures were obtained without intravenous contrast.  COMPARISON:  CT head 04/05/2005  FINDINGS: Brain: Ventricle size and cerebral volume normal. Negative for infarct, hemorrhage, mass. Normal white matter  and brainstem.  Vascular: Normal arterial flow voids  Skull and upper cervical spine: Negative  Sinuses/Orbits: Paranasal sinuses clear.  Normal orbit  Other: None  IMPRESSION: Negative MRI head without contrast.   Electronically Signed   By: Franchot Gallo M.D.   On: 01/19/2018 10:10     Assessment & Plan:   Problem List Items Addressed This Visit      Cardiovascular and Mediastinum   Uncontrolled hypertension - Primary    Reviewed Cardiology recommendation with the patient. He is doing home bp monitoring and his levels are 150/80s. Continue current meds        Genitourinary   CKD (chronic kidney disease) stage 3, GFR 30-59 ml/min (HCC)    Continue avoiding nephrotoxic drug and continue compliance with bp mgmt        Other   Chronic primary headache    Reviewed MRI, will refer to Neurology Continue imitrex and tylenol for headaches      Relevant Orders   Ambulatory referral to Neurology      No orders of the defined types were placed in this encounter.   Follow-up: No follow-ups on file.    Forrest Moron, MD

## 2018-01-23 ENCOUNTER — Encounter: Payer: Self-pay | Admitting: Neurology

## 2018-01-23 DIAGNOSIS — G8929 Other chronic pain: Secondary | ICD-10-CM | POA: Insufficient documentation

## 2018-01-23 DIAGNOSIS — R519 Headache, unspecified: Secondary | ICD-10-CM | POA: Insufficient documentation

## 2018-01-23 DIAGNOSIS — R51 Headache: Secondary | ICD-10-CM

## 2018-01-23 NOTE — Assessment & Plan Note (Signed)
Reviewed Cardiology recommendation with the patient. He is doing home bp monitoring and his levels are 150/80s. Continue current meds

## 2018-01-23 NOTE — Assessment & Plan Note (Signed)
Continue avoiding nephrotoxic drug and continue compliance with bp mgmt

## 2018-01-23 NOTE — Assessment & Plan Note (Signed)
Reviewed MRI, will refer to Neurology Continue imitrex and tylenol for headaches

## 2018-03-30 ENCOUNTER — Ambulatory Visit: Payer: BLUE CROSS/BLUE SHIELD | Admitting: Neurology

## 2018-06-29 DIAGNOSIS — N183 Chronic kidney disease, stage 3 (moderate): Secondary | ICD-10-CM | POA: Diagnosis not present

## 2018-06-29 DIAGNOSIS — Z1331 Encounter for screening for depression: Secondary | ICD-10-CM | POA: Diagnosis not present

## 2018-06-29 DIAGNOSIS — I509 Heart failure, unspecified: Secondary | ICD-10-CM | POA: Diagnosis not present

## 2018-06-29 DIAGNOSIS — I130 Hypertensive heart and chronic kidney disease with heart failure and stage 1 through stage 4 chronic kidney disease, or unspecified chronic kidney disease: Secondary | ICD-10-CM | POA: Diagnosis not present

## 2018-06-29 DIAGNOSIS — D631 Anemia in chronic kidney disease: Secondary | ICD-10-CM | POA: Diagnosis not present

## 2018-07-13 ENCOUNTER — Telehealth: Payer: Self-pay | Admitting: Cardiology

## 2018-07-13 NOTE — Telephone Encounter (Signed)
New Message            Patient is calling for an appointment and there are no appointments available. Patient needs a med ck.Pls call to advise.

## 2018-07-13 NOTE — Telephone Encounter (Signed)
Left the pt a message to call the office back and request to speak with a triage nurse if needed today, or call back on Monday during business hours.

## 2018-07-31 NOTE — Telephone Encounter (Signed)
Our office reached out to this pt to assist with call placed back in May.  Despite many attempts, pt never made a return call back.  Will close this encounter and refer to it as needed.

## 2018-08-01 DIAGNOSIS — G4730 Sleep apnea, unspecified: Secondary | ICD-10-CM | POA: Diagnosis not present

## 2018-08-06 DIAGNOSIS — M545 Low back pain: Secondary | ICD-10-CM | POA: Diagnosis not present

## 2018-08-06 DIAGNOSIS — M549 Dorsalgia, unspecified: Secondary | ICD-10-CM | POA: Diagnosis not present

## 2018-08-06 DIAGNOSIS — M5136 Other intervertebral disc degeneration, lumbar region: Secondary | ICD-10-CM | POA: Diagnosis not present

## 2018-08-06 DIAGNOSIS — G894 Chronic pain syndrome: Secondary | ICD-10-CM | POA: Diagnosis not present

## 2018-08-07 DIAGNOSIS — Z905 Acquired absence of kidney: Secondary | ICD-10-CM | POA: Diagnosis not present

## 2018-08-07 DIAGNOSIS — N184 Chronic kidney disease, stage 4 (severe): Secondary | ICD-10-CM | POA: Diagnosis not present

## 2018-08-07 DIAGNOSIS — N189 Chronic kidney disease, unspecified: Secondary | ICD-10-CM | POA: Diagnosis not present

## 2018-08-07 DIAGNOSIS — R809 Proteinuria, unspecified: Secondary | ICD-10-CM | POA: Diagnosis not present

## 2018-08-07 DIAGNOSIS — D631 Anemia in chronic kidney disease: Secondary | ICD-10-CM | POA: Diagnosis not present

## 2018-08-07 DIAGNOSIS — I129 Hypertensive chronic kidney disease with stage 1 through stage 4 chronic kidney disease, or unspecified chronic kidney disease: Secondary | ICD-10-CM | POA: Diagnosis not present

## 2018-08-07 DIAGNOSIS — N2581 Secondary hyperparathyroidism of renal origin: Secondary | ICD-10-CM | POA: Diagnosis not present

## 2018-08-21 ENCOUNTER — Telehealth: Payer: Self-pay | Admitting: Physician Assistant

## 2018-08-21 DIAGNOSIS — M545 Low back pain: Secondary | ICD-10-CM | POA: Diagnosis not present

## 2018-08-21 DIAGNOSIS — M5126 Other intervertebral disc displacement, lumbar region: Secondary | ICD-10-CM | POA: Diagnosis not present

## 2018-08-21 DIAGNOSIS — M48061 Spinal stenosis, lumbar region without neurogenic claudication: Secondary | ICD-10-CM | POA: Diagnosis not present

## 2018-08-21 NOTE — Telephone Encounter (Signed)
Patient set up for MyChart?  no  Is patient using Smartphone/computer/tablet? yes  Did audio/video work?  Does patient need telephone visit? Video - has smart phone   Best phone number to use? [phone] Special Instructions? Patient will have weight and bp when nurse calls      Virtual Visit Pre-Appointment Phone Call  "(Name), I am calling you today to discuss your upcoming appointment. We are currently trying to limit exposure to the virus that causes COVID-19 by seeing patients at home rather than in the office."  1. "What is the BEST phone number to call the day of the visit?" - include this in appointment notes  2. Do you have or have access to (through a family member/friend) a smartphone with video capability that we can use for your visit?" a. If yes - list this number in appt notes as cell (if different from BEST phone #) and list the appointment type as a VIDEO visit in appointment notes b. If no - list the appointment type as a PHONE visit in appointment notes  3. Confirm consent - "In the setting of the current Covid19 crisis, you are scheduled for a (phone or video) visit with your provider on (date) at (time).  Just as we do with many in-office visits, in order for you to participate in this visit, we must obtain consent.  If you'd like, I can send this to your mychart (if signed up) or email for you to review.  Otherwise, I can obtain your verbal consent now.  All virtual visits are billed to your insurance company just like a normal visit would be.  By agreeing to a virtual visit, we'd like you to understand that the technology does not allow for your provider to perform an examination, and thus may limit your provider's ability to fully assess your condition. If your provider identifies any concerns that need to be evaluated in person, we will make arrangements to do so.  Finally, though the technology is pretty good, we cannot assure that it will always work on either  your or our end, and in the setting of a video visit, we may have to convert it to a phone-only visit.  In either situation, we cannot ensure that we have a secure connection.  Are you willing to proceed?" STAFF: Did the patient verbally acknowledge consent to telehealth visit? Document YES/NO here: yes  4. Advise patient to be prepared - "Two hours prior to your appointment, go ahead and check your blood pressure, pulse, oxygen saturation, and your weight (if you have the equipment to check those) and write them all down. When your visit starts, your provider will ask you for this information. If you have an Apple Watch or Kardia device, please plan to have heart rate information ready on the day of your appointment. Please have a pen and paper handy nearby the day of the visit as well."  5. Give patient instructions for MyChart download to smartphone OR Doximity/Doxy.me as below if video visit (depending on what platform provider is using)  6. Inform patient they will receive a phone call 15 minutes prior to their appointment time (may be from unknown caller ID) so they should be prepared to answer    TELEPHONE CALL NOTE  Antero Derosia has been deemed a candidate for a follow-up tele-health visit to limit community exposure during the Covid-19 pandemic. I spoke with the patient via phone to ensure availability of phone/video source, confirm preferred email &  phone number, and discuss instructions and expectations.  I reminded [name] to be prepared with any vital sign and/or heart rhythm information that could potentially be obtained via home monitoring, at the time of his visit. I reminded Dmauri Rosenow to expect a phone call prior to his visit.  Howie Ill 08/21/2018 2:34 PM   INSTRUCTIONS FOR DOWNLOADING THE MYCHART APP TO SMARTPHONE  - The patient must first make sure to have activated MyChart and know their login information - If Apple, go to [employer] and type in MyChart in the search  bar and download the app. If Android, ask patient to go to [employer] and type in [facility] in the search bar and download the app. The app is free but as with any other app downloads, their phone may require them to verify saved payment information or Apple/Android password.  - The patient will need to then log into the app with their MyChart username and password, and select [facility] as their healthcare provider to link the account. When it is time for your visit, go to the MyChart app, find appointments, and click Begin Video Visit. Be sure to Select Allow for your device to access the Microphone and Camera for your visit. You will then be connected, and your provider will be with you shortly.  **If they have any issues connecting, or need assistance please contact MyChart service desk [phone])**  **If using a computer, in order to ensure the best quality for their visit they will need to use either of the following Internet Browsers: [employer], or Google Chrome**  IF USING DOXIMITY or DOXY.ME - The patient will receive a link just prior to their visit by text.     FULL LENGTH CONSENT FOR TELE-HEALTH VISIT   I hereby voluntarily request, consent and authorize [facility] and its employed or contracted physicians, physician assistants, nurse practitioners or other licensed health care professionals (the Practitioner), to provide me with telemedicine health care services (the Services") as deemed necessary by the treating Practitioner. I acknowledge and consent to receive the Services by the Practitioner via telemedicine. I understand that the telemedicine visit will involve communicating with the Practitioner through live audiovisual communication technology and the disclosure of certain medical information by electronic transmission. I acknowledge that I have been given the opportunity to request an in-person assessment or other available alternative prior to the  telemedicine visit and am voluntarily participating in the telemedicine visit.  I understand that I have the right to withhold or withdraw my consent to the use of telemedicine in the course of my care at any time, without affecting my right to future care or treatment, and that the Practitioner or I may terminate the telemedicine visit at any time. I understand that I have the right to inspect all information obtained and/or recorded in the course of the telemedicine visit and may receive copies of available information for a reasonable fee.  I understand that some of the potential risks of receiving the Services via telemedicine include:   Delay or interruption in medical evaluation due to technological equipment failure or disruption;  Information transmitted may not be sufficient (e.g. poor resolution of images) to allow for appropriate medical decision making by the Practitioner; and/or   In rare instances, security protocols could fail, causing a breach of personal health information.  Furthermore, I acknowledge that it is my responsibility to provide information about my medical history, conditions and care that is complete  and accurate to the best of my ability. I acknowledge that Practitioner's advice, recommendations, and/or decision may be based on factors not within their control, such as incomplete or inaccurate data provided by me or distortions of diagnostic images or specimens that may result from electronic transmissions. I understand that the practice of medicine is not an exact science and that Practitioner makes no warranties or guarantees regarding treatment outcomes. I acknowledge that I will receive a copy of this consent concurrently upon execution via email to the email address I last provided but may also request a printed copy by calling the office of [facility].    I understand that my insurance will be billed for this visit.   I have read or had this consent read to  me.  I understand the contents of this consent, which adequately explains the benefits and risks of the Services being provided via telemedicine.   I have been provided ample opportunity to ask questions regarding this consent and the Services and have had my questions answered to my satisfaction.  I give my informed consent for the services to be provided through the use of telemedicine in my medical care  By participating in this telemedicine visit I agree to the above.

## 2018-08-22 ENCOUNTER — Encounter: Payer: Self-pay | Admitting: Physician Assistant

## 2018-08-22 DIAGNOSIS — I129 Hypertensive chronic kidney disease with stage 1 through stage 4 chronic kidney disease, or unspecified chronic kidney disease: Secondary | ICD-10-CM | POA: Diagnosis not present

## 2018-08-22 DIAGNOSIS — N184 Chronic kidney disease, stage 4 (severe): Secondary | ICD-10-CM | POA: Diagnosis not present

## 2018-08-22 DIAGNOSIS — Z905 Acquired absence of kidney: Secondary | ICD-10-CM | POA: Diagnosis not present

## 2018-08-22 DIAGNOSIS — D631 Anemia in chronic kidney disease: Secondary | ICD-10-CM | POA: Diagnosis not present

## 2018-08-23 ENCOUNTER — Telehealth: Payer: Self-pay | Admitting: Physician Assistant

## 2018-08-23 ENCOUNTER — Ambulatory Visit: Payer: BLUE CROSS/BLUE SHIELD | Admitting: Physician Assistant

## 2018-08-23 ENCOUNTER — Encounter: Payer: Self-pay | Admitting: Physician Assistant

## 2018-08-23 NOTE — Telephone Encounter (Signed)
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
## 2018-08-23 NOTE — Progress Notes (Signed)
Cardiology Office Note    Date:  08/24/2018  ID:  [name], DOB 1965/07/01, MRN [id number] PCP:  Forrest Moron, MD  Cardiologist:  Ena Dawley, MD   Chief Complaint: overdue f/u HTN, HLD  History of Present Illness:  [name] is a 53 y.o. male with history of HTN, OSA (declines CPAP), noncompliance with medications with frequent intolerances, HLD with significant hypertriglyceridemia, CKD stage III in setting of prior right kidney removal who presents for overdue f/u.   His management has been very complicated by patient's refusal to comply with medications and inconsistency with multiple providers involved in his care. He had remote nuc in 2015 that was low risk. 2D echo in 2017 showed EF 65-70%, grade 1 DD, elevated LVEDP, mild LAE, normal RV, mild TR. He previously saw Dr. [name] 10/07/16 complaining of inability to sleep but refused CPAP. When he saw Ermalinda Barrios 11/2016 he had stopped all of his medicines because he felt better not taking them. He was followed by the HTN clinic for his BP and lipids for a period of time. Per PharmD's note 12/2017, "Patient states that he does not think that his body needs medications anymore because they make him feel poorly. Patient asks about the process to get disability insurance, as he is unable to work anymore."   Medication reconciliation is a tremendous challenge today. To recap our records, he previously had edema on amlodipine, so had been on atenlolol and hydralazine. He has been followed by the pharmD clinic for both his BP and profound hypertriglyceridemia. He has prior intolerance to atorvastatin and fenofibrate. These were changed to Crestor and Lovaza in 11/2017. He had myalgias on Crestor so was changed to pravastatin with continued Lovaza and re-trial of fenofibrate. Last labs available in our system 12/2017 showed normal trig 1190, HDL 27, unable to calculate LDL, 09/2017 A1C 5.4, 03/2017 Hgb 13.2, K 4.9, Cr 1.81, LFTS wnl. He did not  return for lipid follow-up. The med list he brings in from nephrology visit yesterday has metoprolol, Crestor and amlodipine on it - which were not on our med list. Hydralazine is no longer on his list. He was asked by nephrology to stop metoprolol and start labetalol. He indicates he is not taking the atenolol, pravastatin and fenofibrate that were previously on our list. He also has seen neurology who recommends repeat sleep study but it does not sound like he is ready to try this again. He mentions multiple times that he he wants to come off his medicine and also inquires about disability because he just cannot tolerate BP medicines. He reports work as a severe stressor in his life. He denies any CP or SOB.  Addendum: [facility] labs reviewed, Cr 3.640 on 08/07/18. Neds ongoing nephrology follow-up as recommended - pt reports close f/u within the next 2 weeks. He also indicates they had spoken with him about preparations for HD.  Past Medical History:  Diagnosis Date   Anemia    Back pain    CKD (chronic kidney disease), stage III (HCC)    Colon polyps    adenomatous   Elevated cholesterol    History of echocardiogram    Echo 11/17: EF 65-70, normal wall motion, grade 1 diastolic dysfunction, trivial MR, mild LAE, mild TR   Hyperlipidemia    Hypertension    no current bp meds for last 3 months   Kidney stones 2007   Medication intolerance    a. multiple with prior nonadherence to regimen.  Otosclerosis of both ears    PUD (peptic ulcer disease)    Has had unspecified surgery for this   Sleep apnea     Past Surgical History:  Procedure Laterality Date   NEPHRECTOMY  02/18/2011   Procedure: NEPHRECTOMY;  Surgeon: Hanley Ben, MD;  Location: [facility];  Service: Urology;  Laterality: Right;   SMALL INTESTINE SURGERY     STAPEDOTOMY  2005   lt ear jan, right ear sept   surgery for ulcers  1990    Current Medications: Current Meds  Medication Sig   acetaminophen  (TYLENOL) 500 MG tablet Take 1 tablet (500 mg total) by mouth every 6 (six) hours as needed for moderate pain.   atenolol (TENORMIN) 50 MG tablet Take 1 tablet (50 mg total) by mouth 2 (two) times daily.   calcitRIOL (ROCALTROL) 0.25 MCG capsule Take 0.25 mcg by mouth daily.   Cetirizine HCl (ZYRTEC ALLERGY) 10 MG CAPS Take 1 capsule (10 mg total) by mouth daily.   fenofibrate (TRICOR) 48 MG tablet Take 1 tablet (48 mg total) by mouth daily.   fluticasone (FLONASE) 50 MCG/ACT nasal spray Place 1 spray into both nostrils daily.   labetalol (NORMODYNE) 200 MG tablet Take 200 mg by mouth 2 (two) times daily.   omega-3 acid ethyl esters (LOVAZA) 1 g capsule Take 2 capsules (2 g total) by mouth 2 (two) times daily.   pravastatin (PRAVACHOL) 20 MG tablet Take 1 tablet (20 mg total) by mouth every evening.   sertraline (ZOLOFT) 25 MG tablet Take 1 tablet (25 mg total) by mouth daily.   SUMAtriptan (IMITREX) 50 MG tablet Take at the start of headache. May repeat in 2 hours if headache persists or recurs.   tamsulosin (FLOMAX) 0.4 MG CAPS capsule Take 1 capsule by mouth at bedtime.   Vitamin D, Ergocalciferol, (DRISDOL) 50000 units CAPS capsule Take 1 capsule (50,000 Units total) by mouth every 7 (seven) days.      Allergies:   Beef-derived products, Pegademase bovine, Poractant alfa, and Pork-derived products   Social History   Socioeconomic History   Marital status: Married    Spouse name: Salya   Number of children: 5   Years of education: Not on file   Highest education level: 12th grade  Occupational History    Employer: Writer  Social Needs   Financial resource strain: Not on file   Food insecurity    Worry: Not on file    Inability: Not on file   Transportation needs    Medical: Not on file    Non-medical: Not on file  Tobacco Use   Smoking status: Never Smoker   Smokeless tobacco: Never Used  Substance and Sexual Activity   Alcohol use: No    Drug use: No   Sexual activity: Not on file  Lifestyle   Physical activity    Days per week: Not on file    Minutes per session: Not on file   Stress: Not on file  Relationships   Social connections    Talks on phone: Not on file    Gets together: Not on file    Attends religious service: Not on file    Active member of club or organization: Not on file    Attends meetings of clubs or organizations: Not on file    Relationship status: Not on file  Other Topics Concern   Not on file  Social History Narrative   Lives at home with wife and family.  He is from [place]. Came to the [place] in 2002.      Patient is right-handed. He lives in a one level home. He drinks 1-2 cups of coffee and tea a day. He does not exercise.     Family History:  The patient's family history includes Esophageal cancer in his cousin; Heart disease in his mother; Heart disease (age of onset: 3) in his brother; Hyperlipidemia in his father, sister, and sister; Hypertension in his mother and sister. There is no history of Colon cancer, Stomach cancer, or Rectal cancer.  ROS:   Please see the history of present illness.   All other systems are reviewed and otherwise negative.    PHYSICAL EXAM:   VS:  BP (!) 162/100    Pulse 80    Ht 5\' 9"  (1.753 m)    Wt 272 lb (123.4 kg)    BMI 40.17 kg/m   BMI: Body mass index is 40.17 kg/m. GEN: Well nourished, well developed M, in no acute distress HEENT: normocephalic, atraumatic Neck: no JVD, carotid bruits, or masses Cardiac: RRR; no murmurs, rubs, or gallops, 1+ stiff chronic appearing BLE edema  Respiratory:  clear to auscultation bilaterally, normal work of breathing GI: soft, nontender, nondistended, + BS MS: no deformity or atrophy Skin: warm and dry, no rash Neuro:  Alert and Oriented x 3, Strength and sensation are intact, follows commands Psych: euthymic mood, full affect  Wt Readings from Last 3 Encounters:  08/24/18 272 lb (123.4 kg)  08/24/18 271 lb  (122.9 kg)  01/22/18 264 lb 6.4 oz (119.9 kg)      Studies/Labs Reviewed:   EKG:  EKG was ordered today and personally reviewed by me and demonstrates NSR 80bpm, possible LAE, nonspecific TW changes possibly due to LVH (TWI I, avL)  Recent Labs: 01/09/2018: ALT 15   Lipid Panel    Component Value Date/Time   CHOL 360 (H) 01/09/2018 0904   TRIG 1,190 (HH) 01/09/2018 0904   HDL 27 (L) 01/09/2018 0904   CHOLHDL 13.3 (H) 01/09/2018 0904   CHOLHDL 5.3 (H) 12/09/2015 1328   VLDL NOT CALC 12/09/2015 1328   Singer Comment 01/09/2018 0904   LDLDIRECT 139.0 05/06/2014 1209    Additional studies/ records that were reviewed today include: Summarized above   ASSESSMENT & PLAN:   1. Essential HTN - extremely challenging to manage, not necessarily from organic cause because patient is MDI (multidrug intolerant). I have suggested that he should increase his labetalol to 400mg  BID since BP is still high despite starting this yesterday but he declines to make any changes today. He does not wish to start any other BP medicines today either because they "don't work for his body." I told him I do not have any other good solutions for managing his blood pressure aside from recommending he comply with OSA eval. Given the absence of ongoing active cardiac issues requiring management, I would strongly recommend that his blood pressure management come from one ongoing provider that can also address some of the social stressors he reports. He is inconsistent with his medication history reporting which can also lead to dangers if he is managed by multiple providers. Therefore I've asked him to follow up with his PCP within a few days to establish a plan for moving forward and managing this. I told him I do not see an active cardiology issue for which we can start process of disability for. Discussed long term dangerous effects of BP. 2. Hyperlipidemia/hypertigylceridemia - will  likely be difficult to manage due  to similar challenges as BP. He did not achieve success in the pharmD clinic. Will update CMET, CBC, lipids and direct LDL refer to Dr. Debara Pickett for input on his severely high triglycerides. I remain concerned about our ability to achieve success even with expert input. 3. CKD stage III - managed by nephrology. Update labs when he comes for echo. Addendum: [facility] labs reviewed, Cr 3.640 on 08/07/18. Neds ongoing nephrology follow-up as recommended. 4. Lower extremity edema - he reports this is due to amlodipine which I suspect is very well true, compounded by his poor blood pressure control and renal disease. Unfortunately he is resistant to making changes for any other medicines so I would not abruptly stop his amlodipine. We will update echocardiogram to ensure stability of LV function.  Disposition: F/u with Dr. Debara Pickett for lipids, and Dr. [name] in 1 year. Needs close primary care follow-up.  Medication Adjustments/Labs and Tests Ordered: Current medicines are reviewed at length with the patient today.  Concerns regarding medicines are outlined above. Medication changes, Labs and Tests ordered today are summarized above and listed in the Patient Instructions accessible in Encounters.   Signed, Charlie Pitter, PA-C  08/24/2018 2:37 PM    [address], [place], [place]  [address] Phone: [phone]; Fax: [phone]

## 2018-08-23 NOTE — Telephone Encounter (Signed)
New Message     Left Message to call back and answer COVID questions

## 2018-08-24 ENCOUNTER — Encounter: Payer: Self-pay | Admitting: Neurology

## 2018-08-24 ENCOUNTER — Ambulatory Visit: Payer: BLUE CROSS/BLUE SHIELD | Admitting: Physician Assistant

## 2018-08-24 ENCOUNTER — Encounter: Payer: Self-pay | Admitting: Physician Assistant

## 2018-08-24 ENCOUNTER — Other Ambulatory Visit: Payer: Self-pay

## 2018-08-24 ENCOUNTER — Ambulatory Visit (INDEPENDENT_AMBULATORY_CARE_PROVIDER_SITE_OTHER): Payer: BLUE CROSS/BLUE SHIELD | Admitting: Neurology

## 2018-08-24 VITALS — BP 190/116 | HR 76 | Temp 98.9°F | Ht 69.0 in | Wt 271.0 lb

## 2018-08-24 VITALS — BP 162/100 | HR 80 | Ht 69.0 in | Wt 272.0 lb

## 2018-08-24 DIAGNOSIS — G44221 Chronic tension-type headache, intractable: Secondary | ICD-10-CM

## 2018-08-24 DIAGNOSIS — G4440 Drug-induced headache, not elsewhere classified, not intractable: Secondary | ICD-10-CM | POA: Diagnosis not present

## 2018-08-24 DIAGNOSIS — I10 Essential (primary) hypertension: Secondary | ICD-10-CM

## 2018-08-24 DIAGNOSIS — E785 Hyperlipidemia, unspecified: Secondary | ICD-10-CM

## 2018-08-24 DIAGNOSIS — E781 Pure hyperglyceridemia: Secondary | ICD-10-CM

## 2018-08-24 DIAGNOSIS — N183 Chronic kidney disease, stage 3 unspecified: Secondary | ICD-10-CM

## 2018-08-24 DIAGNOSIS — G4733 Obstructive sleep apnea (adult) (pediatric): Secondary | ICD-10-CM | POA: Diagnosis not present

## 2018-08-24 MED ORDER — SERTRALINE HCL 25 MG PO TABS: 25.0000 mg | ORAL_TABLET | Freq: Every day | ORAL | 3 refills | Status: DC

## 2018-08-24 NOTE — Patient Instructions (Addendum)
1.  Stop all pain medications.  You may take Tylenol for headache but limited to no more than 2 days out of the week.   2.  Start sertraline 25mg  daily.  If headaches not improved in 4 weeks, contact us and I will increase dose. 3.  Stop coffee, tea and all beverages with caffeine. 4.  Drink plenty of water 5.  Increase exercise 6.  Recommend seeing a sleep specialist 7.  Follow up in 4 months.  1.     .   Tylenol           . 2.   25  .      4      . 3.       . 4.     5.   6.     7.   4 .

## 2018-08-24 NOTE — Patient Instructions (Addendum)
Medication Instructions:  Your physician recommends that you continue on your current medications as directed. Please refer to the Current Medication list given to you today. :    If you need a refill on your cardiac medications before your next appointment, please call your pharmacy.   Lab work: TODDAY:  CMET, CBC, LIPID, & DIRECT LDL  If you have labs (blood work) drawn today and your tests are completely normal, you will receive your results only by: Marland Kitchen MyChart Message (if you have MyChart) OR . A paper copy in the mail If you have any lab test that is abnormal or we need to change your treatment, we will call you to review the results.  Testing/Procedures: Your physician has requested that you have an echocardiogram. Echocardiography is a painless test that uses sound waves to create images of your heart. It provides your doctor with information about the size and shape of your heart and how well your heart's chambers and valves are working. This procedure takes approximately one hour. There are no restrictions for this procedure.    Follow-Up: You have been referred to DR. HILTY IN THE LIPID CLINIC.     Your physician recommends that you schedule a follow-up appointment in:  [place], you and your health needs are our priority.  As part of our continuing mission to provide you with exceptional heart care, we have created designated Provider Care Teams.  These Care Teams include your primary Cardiologist (physician) and Advanced Practice Providers (APPs -  Physician Assistants and Nurse Practitioners) who all work together to provide you with the care you need, when you need it. You will need a follow up appointment in 12 months.  Please call our office 2 months in advance to schedule this appointment.  You may see Ena Dawley, MD or one of the following Advanced Practice Providers on your designated Care Team:   [place], PA-C Melina Copa, PA-C . Ermalinda Barrios, PA-C  Any Other Special Instructions Will Be Listed Below (If Applicable).  Echocardiogram An echocardiogram is a procedure that uses painless sound waves (ultrasound) to produce an image of the heart. Images from an echocardiogram can provide important information about:  Signs of coronary artery disease (CAD).  Aneurysm detection. An aneurysm is a weak or damaged part of an artery wall that bulges out from the normal force of blood pumping through the body.  Heart size and shape. Changes in the size or shape of the heart can be associated with certain conditions, including heart failure, aneurysm, and CAD.  Heart muscle function.  Heart valve function.  Signs of a past heart attack.  Fluid buildup around the heart.  Thickening of the heart muscle.  A tumor or infectious growth around the heart valves. Tell a health care provider about:  Any allergies you have.  All medicines you are taking, including vitamins, herbs, eye drops, creams, and over-the-counter medicines.  Any blood disorders you have.  Any surgeries you have had.  Any medical conditions you have.  Whether you are pregnant or may be pregnant. What are the risks? Generally, this is a safe procedure. However, problems may occur, including:  Allergic reaction to dye (contrast) that may be used during the procedure. What happens before the procedure? No specific preparation is needed. You may eat and drink normally. What happens during the procedure?   An IV tube may be inserted into one of your veins.  You may receive contrast through this tube. A contrast is an injection that improves the quality of the pictures from your heart.  A gel will be applied to your chest.  A wand-like tool (transducer) will be moved over your chest. The gel will help to transmit the sound waves from the transducer.  The sound waves will harmlessly bounce off of your heart to  allow the heart images to be captured in real-time motion. The images will be recorded on a computer. The procedure may vary among health care providers and hospitals. What happens after the procedure?  You may return to your normal, everyday life, including diet, activities, and medicines, unless your health care provider tells you not to do that. Summary  An echocardiogram is a procedure that uses painless sound waves (ultrasound) to produce an image of the heart.  Images from an echocardiogram can provide important information about the size and shape of your heart, heart muscle function, heart valve function, and fluid buildup around your heart.  You do not need to do anything to prepare before this procedure. You may eat and drink normally.  After the echocardiogram is completed, you may return to your normal, everyday life, unless your health care provider tells you not to do that. This information is not intended to replace advice given to you by your health care provider. Make sure you discuss any questions you have with your health care provider. Document Released: 01/29/2000 Document Revised: 05/24/2018 Document Reviewed: 03/05/2016 Elsevier Patient Education  2020 [employer].

## 2018-08-25 ENCOUNTER — Other Ambulatory Visit: Payer: Self-pay

## 2018-08-25 ENCOUNTER — Observation Stay (HOSPITAL_COMMUNITY)
Admission: EM | Admit: 2018-08-25 | Discharge: 2018-08-27 | Disposition: A | Discharge status: Home / Self Care | Payer: BLUE CROSS/BLUE SHIELD | Attending: Internal Medicine | Admitting: Internal Medicine

## 2018-08-25 ENCOUNTER — Encounter (HOSPITAL_COMMUNITY): Payer: Self-pay | Admitting: Emergency Medicine

## 2018-08-25 ENCOUNTER — Telehealth: Payer: Self-pay | Admitting: Physician Assistant

## 2018-08-25 ENCOUNTER — Emergency Department (HOSPITAL_COMMUNITY): Payer: BLUE CROSS/BLUE SHIELD

## 2018-08-25 DIAGNOSIS — R531 Weakness: Secondary | ICD-10-CM

## 2018-08-25 DIAGNOSIS — N179 Acute kidney failure, unspecified: Secondary | ICD-10-CM | POA: Diagnosis not present

## 2018-08-25 DIAGNOSIS — G8929 Other chronic pain: Secondary | ICD-10-CM | POA: Diagnosis present

## 2018-08-25 DIAGNOSIS — R519 Headache, unspecified: Secondary | ICD-10-CM | POA: Diagnosis present

## 2018-08-25 DIAGNOSIS — G4730 Sleep apnea, unspecified: Secondary | ICD-10-CM | POA: Insufficient documentation

## 2018-08-25 DIAGNOSIS — R51 Headache: Secondary | ICD-10-CM | POA: Diagnosis not present

## 2018-08-25 DIAGNOSIS — E875 Hyperkalemia: Secondary | ICD-10-CM | POA: Diagnosis present

## 2018-08-25 DIAGNOSIS — E785 Hyperlipidemia, unspecified: Secondary | ICD-10-CM | POA: Insufficient documentation

## 2018-08-25 DIAGNOSIS — E781 Pure hyperglyceridemia: Secondary | ICD-10-CM | POA: Diagnosis not present

## 2018-08-25 DIAGNOSIS — I130 Hypertensive heart and chronic kidney disease with heart failure and stage 1 through stage 4 chronic kidney disease, or unspecified chronic kidney disease: Secondary | ICD-10-CM | POA: Diagnosis not present

## 2018-08-25 DIAGNOSIS — N184 Chronic kidney disease, stage 4 (severe): Secondary | ICD-10-CM | POA: Diagnosis not present

## 2018-08-25 DIAGNOSIS — Z79899 Other long term (current) drug therapy: Secondary | ICD-10-CM | POA: Diagnosis not present

## 2018-08-25 DIAGNOSIS — Z6838 Body mass index (BMI) 38.0-38.9, adult: Secondary | ICD-10-CM | POA: Diagnosis not present

## 2018-08-25 DIAGNOSIS — I5033 Acute on chronic diastolic (congestive) heart failure: Secondary | ICD-10-CM | POA: Diagnosis not present

## 2018-08-25 DIAGNOSIS — I10 Essential (primary) hypertension: Secondary | ICD-10-CM

## 2018-08-25 DIAGNOSIS — Z1159 Encounter for screening for other viral diseases: Secondary | ICD-10-CM | POA: Diagnosis not present

## 2018-08-25 DIAGNOSIS — Z03818 Encounter for observation for suspected exposure to other biological agents ruled out: Secondary | ICD-10-CM | POA: Diagnosis not present

## 2018-08-25 DIAGNOSIS — R2689 Other abnormalities of gait and mobility: Secondary | ICD-10-CM | POA: Insufficient documentation

## 2018-08-25 DIAGNOSIS — J9811 Atelectasis: Secondary | ICD-10-CM | POA: Diagnosis not present

## 2018-08-25 LAB — CBC WITH DIFFERENTIAL/PLATELET
Abs Immature Granulocytes: 0.05 10*3/uL (ref 0.00–0.07)
Basophils Absolute: 0.1 10*3/uL (ref 0.0–0.1)
Basophils Relative: 1 %
Eosinophils Absolute: 0.2 10*3/uL (ref 0.0–0.5)
Eosinophils Relative: 3 %
HCT: 33.8 % — ABNORMAL LOW (ref 39.0–52.0)
Hemoglobin: 11 g/dL — ABNORMAL LOW (ref 13.0–17.0)
Immature Granulocytes: 1 %
Lymphocytes Relative: 21 %
Lymphs Abs: 1.3 10*3/uL (ref 0.7–4.0)
MCH: 28.6 pg (ref 26.0–34.0)
MCHC: 32.5 g/dL (ref 30.0–36.0)
MCV: 87.8 fL (ref 80.0–100.0)
Monocytes Absolute: 0.6 10*3/uL (ref 0.1–1.0)
Monocytes Relative: 9 %
Neutro Abs: 4.3 10*3/uL (ref 1.7–7.7)
Neutrophils Relative %: 65 %
Platelets: 180 10*3/uL (ref 150–400)
RBC: 3.85 MIL/uL — ABNORMAL LOW (ref 4.22–5.81)
RDW: 12.9 % (ref 11.5–15.5)
WBC: 6.4 10*3/uL (ref 4.0–10.5)
nRBC: 0 % (ref 0.0–0.2)

## 2018-08-25 LAB — I-STAT CHEM 8, ED
BUN: 41 mg/dL — ABNORMAL HIGH (ref 6–20)
Calcium, Ion: 1.11 mmol/L — ABNORMAL LOW (ref 1.15–1.40)
Chloride: 110 mmol/L (ref 98–111)
Creatinine, Ser: 3.8 mg/dL — ABNORMAL HIGH (ref 0.61–1.24)
Glucose, Bld: 104 mg/dL — ABNORMAL HIGH (ref 70–99)
HCT: 32 % — ABNORMAL LOW (ref 39.0–52.0)
Hemoglobin: 10.9 g/dL — ABNORMAL LOW (ref 13.0–17.0)
Potassium: 5.5 mmol/L — ABNORMAL HIGH (ref 3.5–5.1)
Sodium: 138 mmol/L (ref 135–145)
TCO2: 24 mmol/L (ref 22–32)

## 2018-08-25 LAB — LIPID PANEL
Chol/HDL Ratio: 13 ratio — ABNORMAL HIGH (ref 0.0–5.0)
Cholesterol, Total: 313 mg/dL — ABNORMAL HIGH (ref 100–199)
HDL: 24 mg/dL — ABNORMAL LOW (ref >39)
Triglycerides: 1340 mg/dL (ref 0–149)

## 2018-08-25 LAB — SODIUM, URINE, RANDOM: Sodium, Ur: 55 mmol/L

## 2018-08-25 LAB — COMPREHENSIVE METABOLIC PANEL
ALT: 13 IU/L (ref 0–44)
ALT: 17 U/L (ref 0–44)
AST: 11 IU/L (ref 0–40)
AST: 19 U/L (ref 15–41)
Albumin/Globulin Ratio: 1.8 (ref 1.2–2.2)
Albumin: 3.7 g/dL — ABNORMAL LOW (ref 3.8–4.9)
Albumin: 3.1 g/dL — ABNORMAL LOW (ref 3.5–5.0)
Alkaline Phosphatase: 77 IU/L (ref 39–117)
Alkaline Phosphatase: 70 U/L (ref 38–126)
Anion gap: 9 (ref 5–15)
BUN/Creatinine Ratio: 13 (ref 9–20)
BUN: 46 mg/dL — ABNORMAL HIGH (ref 6–24)
BUN: 41 mg/dL — ABNORMAL HIGH (ref 6–20)
Bilirubin Total: <0.2 mg/dL (ref 0.0–1.2)
CO2: 18 mmol/L — ABNORMAL LOW (ref 20–29)
CO2: 22 mmol/L (ref 22–32)
Calcium: 8.6 mg/dL — ABNORMAL LOW (ref 8.7–10.2)
Calcium: 8.4 mg/dL — ABNORMAL LOW (ref 8.9–10.3)
Chloride: 100 mmol/L (ref 96–106)
Chloride: 108 mmol/L (ref 98–111)
Creatinine, Ser: 3.55 mg/dL — ABNORMAL HIGH (ref 0.76–1.27)
Creatinine, Ser: 3.7 mg/dL — ABNORMAL HIGH (ref 0.61–1.24)
GFR calc Af Amer: 21 mL/min/{1.73_m2} — ABNORMAL LOW (ref >59)
GFR calc Af Amer: 20 mL/min — ABNORMAL LOW (ref >60)
GFR calc non Af Amer: 18 mL/min/{1.73_m2} — ABNORMAL LOW (ref >59)
GFR calc non Af Amer: 18 mL/min — ABNORMAL LOW (ref >60)
Globulin, Total: 2.1 g/dL (ref 1.5–4.5)
Glucose, Bld: 106 mg/dL — ABNORMAL HIGH (ref 70–99)
Glucose: 84 mg/dL (ref 65–99)
Potassium: 6 mmol/L (ref 3.5–5.2)
Potassium: 5.6 mmol/L — ABNORMAL HIGH (ref 3.5–5.1)
Sodium: 136 mmol/L (ref 134–144)
Sodium: 139 mmol/L (ref 135–145)
Total Bilirubin: 0.5 mg/dL (ref 0.3–1.2)
Total Protein: 5.8 g/dL — ABNORMAL LOW (ref 6.0–8.5)
Total Protein: 5.5 g/dL — ABNORMAL LOW (ref 6.5–8.1)

## 2018-08-25 LAB — BRAIN NATRIURETIC PEPTIDE: B Natriuretic Peptide: 251.6 pg/mL — ABNORMAL HIGH (ref 0.0–100.0)

## 2018-08-25 LAB — URINALYSIS, ROUTINE W REFLEX MICROSCOPIC
Bacteria, UA: NONE SEEN
Bilirubin Urine: NEGATIVE
Glucose, UA: NEGATIVE mg/dL
Hgb urine dipstick: NEGATIVE
Ketones, ur: NEGATIVE mg/dL
Leukocytes,Ua: NEGATIVE
Nitrite: NEGATIVE
Protein, ur: >=300 mg/dL — AB
Specific Gravity, Urine: 1.011 (ref 1.005–1.030)
pH: 6 (ref 5.0–8.0)

## 2018-08-25 LAB — PROTEIN, URINE, RANDOM: Total Protein, Urine: 380 mg/dL

## 2018-08-25 LAB — CBC
Hematocrit: 32.5 % — ABNORMAL LOW (ref 37.5–51.0)
Hemoglobin: 11.1 g/dL — ABNORMAL LOW (ref 13.0–17.7)
MCH: 27.9 pg (ref 26.6–33.0)
MCHC: 34.2 g/dL (ref 31.5–35.7)
MCV: 82 fL (ref 79–97)
Platelets: 192 10*3/uL (ref 150–450)
RBC: 3.98 x10E6/uL — ABNORMAL LOW (ref 4.14–5.80)
RDW: 13.1 % (ref 11.6–15.4)
WBC: 7 10*3/uL (ref 3.4–10.8)

## 2018-08-25 LAB — TSH: TSH: 1.125 u[IU]/mL (ref 0.350–4.500)

## 2018-08-25 LAB — LIPASE, BLOOD: Lipase: 42 U/L (ref 11–51)

## 2018-08-25 LAB — LDL CHOLESTEROL, DIRECT: LDL Direct: 63 mg/dL (ref 0–99)

## 2018-08-25 LAB — CREATININE, URINE, RANDOM: Creatinine, Urine: 99.51 mg/dL

## 2018-08-25 LAB — SARS CORONAVIRUS 2 BY RT PCR (HOSPITAL ORDER, PERFORMED IN ~~LOC~~ HOSPITAL LAB): SARS Coronavirus 2: NEGATIVE

## 2018-08-25 MED ORDER — TAMSULOSIN HCL 0.4 MG PO CAPS
0.4000 mg | ORAL_CAPSULE | Freq: Every day | ORAL | Status: DC
  Administered 2018-08-25 – 2018-08-26 (×2): 0.4 mg via ORAL
  Filled 2018-08-25 (×2): qty 1

## 2018-08-25 MED ORDER — FENOFIBRATE 160 MG PO TABS
160.0000 mg | ORAL_TABLET | Freq: Every day | ORAL | Status: DC
  Administered 2018-08-26 – 2018-08-27 (×2): 160 mg via ORAL
  Filled 2018-08-25 (×2): qty 1

## 2018-08-25 MED ORDER — SERTRALINE HCL 25 MG PO TABS
25.0000 mg | ORAL_TABLET | Freq: Every day | ORAL | Status: DC
  Administered 2018-08-25 – 2018-08-27 (×3): 25 mg via ORAL
  Filled 2018-08-25 (×3): qty 1

## 2018-08-25 MED ORDER — SUMATRIPTAN SUCCINATE 25 MG PO TABS
25.0000 mg | ORAL_TABLET | ORAL | Status: DC | PRN
  Administered 2018-08-27: 25 mg via ORAL
  Filled 2018-08-25 (×3): qty 1

## 2018-08-25 MED ORDER — ACETAMINOPHEN 650 MG RE SUPP: 650.0000 mg | Freq: Four times a day (QID) | RECTAL | Status: DC | PRN

## 2018-08-25 MED ORDER — ACETAMINOPHEN 325 MG PO TABS
650.0000 mg | ORAL_TABLET | Freq: Four times a day (QID) | ORAL | Status: DC | PRN
  Administered 2018-08-26 (×2): 650 mg via ORAL
  Filled 2018-08-25 (×2): qty 2

## 2018-08-25 MED ORDER — ONDANSETRON HCL 4 MG/2ML IJ SOLN: 4.0000 mg | Freq: Four times a day (QID) | INTRAMUSCULAR | Status: DC | PRN

## 2018-08-25 MED ORDER — SODIUM ZIRCONIUM CYCLOSILICATE 10 G PO PACK
10.0000 g | PACK | Freq: Once | ORAL | Status: AC
  Administered 2018-08-26: 10 g via ORAL
  Filled 2018-08-25: qty 1

## 2018-08-25 MED ORDER — CALCITRIOL 0.25 MCG PO CAPS
0.2500 ug | ORAL_CAPSULE | Freq: Every day | ORAL | Status: DC
  Administered 2018-08-26 – 2018-08-27 (×2): 0.25 ug via ORAL
  Filled 2018-08-25 (×2): qty 1

## 2018-08-25 MED ORDER — ROSUVASTATIN CALCIUM 5 MG PO TABS
10.0000 mg | ORAL_TABLET | Freq: Every day | ORAL | Status: DC
  Administered 2018-08-26 – 2018-08-27 (×2): 10 mg via ORAL
  Filled 2018-08-25 (×2): qty 2

## 2018-08-25 MED ORDER — FUROSEMIDE 10 MG/ML IJ SOLN
40.0000 mg | Freq: Once | INTRAMUSCULAR | Status: AC
  Administered 2018-08-25: 40 mg via INTRAVENOUS
  Filled 2018-08-25: qty 4

## 2018-08-25 MED ORDER — METOPROLOL TARTRATE 25 MG PO TABS
50.0000 mg | ORAL_TABLET | Freq: Two times a day (BID) | ORAL | Status: DC
  Administered 2018-08-25 – 2018-08-26 (×2): 50 mg via ORAL
  Filled 2018-08-25 (×2): qty 2

## 2018-08-25 MED ORDER — HYDRALAZINE HCL 20 MG/ML IJ SOLN: 10.0000 mg | INTRAMUSCULAR | Status: DC | PRN

## 2018-08-25 MED ORDER — ONDANSETRON HCL 4 MG PO TABS: 4.0000 mg | ORAL_TABLET | Freq: Four times a day (QID) | ORAL | Status: DC | PRN

## 2018-08-25 MED ORDER — AMLODIPINE BESYLATE 5 MG PO TABS
10.0000 mg | ORAL_TABLET | Freq: Every day | ORAL | Status: DC
  Administered 2018-08-26 – 2018-08-27 (×2): 10 mg via ORAL
  Filled 2018-08-25 (×2): qty 2

## 2018-08-25 MED ORDER — LABETALOL HCL 200 MG PO TABS
200.0000 mg | ORAL_TABLET | Freq: Two times a day (BID) | ORAL | Status: DC
  Administered 2018-08-25 – 2018-08-26 (×2): 200 mg via ORAL
  Filled 2018-08-25 (×2): qty 1

## 2018-08-25 NOTE — ED Notes (Addendum)
ED TO INPATIENT HANDOFF REPORT  ED Nurse Name and Phone #:   S Name/Age/Gender [name] 53 y.o. male Room/Bed: 014C/014C  Code Status   Code Status: Full Code  Home/SNF/Other Home Patient oriented to: self, place, time and situation Is this baseline? Yes   Triage Complete: Triage complete  Chief Complaint Kidney infection  Triage Note Pt arrives from home. Pt states that he was seen by his heart doctor yesterday where lab work was drawn. Pt states that his doctor called him today and told him to come be seen for a kidney infection.   Allergies Allergies  Allergen Reactions  . Beef-Derived Products Other (See Comments)    Cultural preference  . Pegademase Bovine Other (See Comments)    Cultural preference  . Poractant Alfa Other (See Comments)    Cultural preference  . Pork-Derived Products Other (See Comments)    Cultural preference    Level of Care/Admitting Diagnosis ED Disposition    ED Disposition Condition [facility] Area: [place] [[id number]]  Level of Care: Telemetry Medical [104]  I expect the patient will be discharged within 24 hours: No (not a candidate for 5C-Observation unit)  Covid Evaluation: Confirmed COVID Negative  Diagnosis: CKD (chronic kidney disease) stage 4, GFR 15-29 ml/min [facility]) [329518]  Admitting Physician: Norval [facility] [[id number]]  Attending Physician: Norval [facility] [[id number]]  PT Class (Do Not Modify): Observation [104]  PT Acc Code (Do Not Modify): Observation [10022]       B Medical/Surgery History Past Medical History:  Diagnosis Date  . Anemia   . Back pain   . CKD (chronic kidney disease), stage III ([place])   . Colon polyps    adenomatous  . Elevated cholesterol   . History of echocardiogram    Echo 11/17: EF 65-70, normal wall motion, grade 1 diastolic dysfunction, trivial MR, mild LAE, mild TR  . Hyperlipidemia   . Hypertension    no current bp meds for last 3 months  . Kidney  stones 2007  . Medication intolerance    a. multiple with prior nonadherence to regimen.  . Otosclerosis of both ears   . PUD (peptic ulcer disease)    Has had unspecified surgery for this  . Sleep apnea    Past Surgical History:  Procedure Laterality Date  . NEPHRECTOMY  02/18/2011   Procedure: NEPHRECTOMY;  Surgeon: Hanley Ben, MD;  Location: [facility];  Service: Urology;  Laterality: Right;  . SMALL INTESTINE SURGERY    . STAPEDOTOMY  2005   lt ear jan, right ear sept  . surgery for ulcers  1990     A IV Location/Drains/Wounds Patient Lines/Drains/Airways Status   Active Line/Drains/Airways    Name:   Placement date:   Placement time:   Site:   Days:   Peripheral IV 08/25/18 Right Hand   08/25/18    1401    Hand   less than 1          Intake/Output Last 24 hours No intake or output data in the 24 hours ending 08/25/18 1804  Labs/Imaging Results for orders placed or performed during the hospital encounter of 08/25/18 (from the past 48 hour(s))  Comprehensive metabolic panel     Status: Abnormal   Collection Time: 08/25/18  2:03 PM  Result Value Ref Range   Sodium 139 135 - 145 mmol/L   Potassium 5.6 (H) 3.5 - 5.1 mmol/L    Comment: SLIGHT HEMOLYSIS LIPEMIC  SPECIMEN    Chloride 108 98 - 111 mmol/L   CO2 22 22 - 32 mmol/L   Glucose, Bld 106 (H) 70 - 99 mg/dL   BUN 41 (H) 6 - 20 mg/dL   Creatinine, Ser 3.70 (H) 0.61 - 1.24 mg/dL   Calcium 8.4 (L) 8.9 - 10.3 mg/dL   Total Protein 5.5 (L) 6.5 - 8.1 g/dL   Albumin 3.1 (L) 3.5 - 5.0 g/dL   AST 19 15 - 41 U/L   ALT 17 0 - 44 U/L   Alkaline Phosphatase 70 38 - 126 U/L   Total Bilirubin 0.5 0.3 - 1.2 mg/dL   GFR calc non Af Amer 18 (L) >60 mL/min   GFR calc Af Amer 20 (L) >60 mL/min   Anion gap 9 5 - 15    Comment: Performed at [facility], [address], [place], [place] [address]  CBC with Differential     Status: Abnormal   Collection Time: 08/25/18  2:03 PM  Result Value Ref Range   WBC 6.4 4.0 - 10.5  K/uL   RBC 3.85 (L) 4.22 - 5.81 MIL/uL   Hemoglobin 11.0 (L) 13.0 - 17.0 g/dL   HCT 33.8 (L) 39.0 - 52.0 %   MCV 87.8 80.0 - 100.0 fL   MCH 28.6 26.0 - 34.0 pg   MCHC 32.5 30.0 - 36.0 g/dL   RDW 12.9 11.5 - 15.5 %   Platelets 180 150 - 400 K/uL   nRBC 0.0 0.0 - 0.2 %   Neutrophils Relative % 65 %   Neutro Abs 4.3 1.7 - 7.7 K/uL   Lymphocytes Relative 21 %   Lymphs Abs 1.3 0.7 - 4.0 K/uL   Monocytes Relative 9 %   Monocytes Absolute 0.6 0.1 - 1.0 K/uL   Eosinophils Relative 3 %   Eosinophils Absolute 0.2 0.0 - 0.5 K/uL   Basophils Relative 1 %   Basophils Absolute 0.1 0.0 - 0.1 K/uL   Immature Granulocytes 1 %   Abs Immature Granulocytes 0.05 0.00 - 0.07 K/uL    Comment: Performed at [facility] [address] [place], [place] [address]  Lipase, blood     Status: None   Collection Time: 08/25/18  2:03 PM  Result Value Ref Range   Lipase 42 11 - 51 U/L    Comment: Performed at [facility] [address], [place], [place] [address]  Brain natriuretic peptide     Status: Abnormal   Collection Time: 08/25/18  2:03 PM  Result Value Ref Range   B Natriuretic Peptide 251.6 (H) 0.0 - 100.0 pg/mL    Comment: Performed at [facility] [address], [place], [place] [address]  I-stat chem 8, ED (not at [facility] or [facility])     Status: Abnormal   Collection Time: 08/25/18  2:12 PM  Result Value Ref Range   Sodium 138 135 - 145 mmol/L   Potassium 5.5 (H) 3.5 - 5.1 mmol/L   Chloride 110 98 - 111 mmol/L   BUN 41 (H) 6 - 20 mg/dL   Creatinine, Ser 3.80 (H) 0.61 - 1.24 mg/dL   Glucose, Bld 104 (H) 70 - 99 mg/dL   Calcium, Ion 1.11 (L) 1.15 - 1.40 mmol/L   TCO2 24 22 - 32 mmol/L   Hemoglobin 10.9 (L) 13.0 - 17.0 g/dL   HCT 32.0 (L) 39.0 - 52.0 %  Urinalysis, Routine w reflex microscopic     Status: Abnormal  Collection Time: 08/25/18  2:36 PM  Result Value Ref Range   Color, Urine STRAW (A) YELLOW   APPearance CLEAR CLEAR   Specific Gravity, Urine 1.011 1.005 -  1.030   pH 6.0 5.0 - 8.0   Glucose, UA NEGATIVE NEGATIVE mg/dL   Hgb urine dipstick NEGATIVE NEGATIVE   Bilirubin Urine NEGATIVE NEGATIVE   Ketones, ur NEGATIVE NEGATIVE mg/dL   Protein, ur >=300 (A) NEGATIVE mg/dL   Nitrite NEGATIVE NEGATIVE   Leukocytes,Ua NEGATIVE NEGATIVE   RBC / HPF 0-5 0 - 5 RBC/hpf   WBC, UA 0-5 0 - 5 WBC/hpf   Bacteria, UA NONE SEEN NONE SEEN    Comment: Performed at [facility] [address], [place], [place] [address]  SARS Coronavirus 2 (CEPHEID- Performed in [facility] hospital lab), Hosp Order     Status: None   Collection Time: 08/25/18  3:44 PM   Specimen: Nasopharyngeal Swab  Result Value Ref Range   SARS Coronavirus 2 NEGATIVE NEGATIVE    Comment: (NOTE) If result is NEGATIVE SARS-CoV-2 target nucleic acids are NOT DETECTED. The SARS-CoV-2 RNA is generally detectable in upper and lower  respiratory specimens during the acute phase of infection. The lowest  concentration of SARS-CoV-2 viral copies this assay can detect is 250  copies / mL. A negative result does not preclude SARS-CoV-2 infection  and should not be used as the sole basis for treatment or other  patient management decisions.  A negative result may occur with  improper specimen collection / handling, submission of specimen other  than nasopharyngeal swab, presence of viral mutation(s) within the  areas targeted by this assay, and inadequate number of viral copies  (<250 copies / mL). A negative result must be combined with clinical  observations, patient history, and epidemiological information. If result is POSITIVE SARS-CoV-2 target nucleic acids are DETECTED. The SARS-CoV-2 RNA is generally detectable in upper and lower  respiratory specimens dur ing the acute phase of infection.  Positive  results are indicative of active infection with SARS-CoV-2.  Clinical  correlation with patient history and other diagnostic information is  necessary to determine patient infection  status.  Positive results do  not rule out bacterial infection or co-infection with other viruses. If result is PRESUMPTIVE POSTIVE SARS-CoV-2 nucleic acids MAY BE PRESENT.   A presumptive positive result was obtained on the submitted specimen  and confirmed on repeat testing.  While 2019 novel coronavirus  (SARS-CoV-2) nucleic acids may be present in the submitted sample  additional confirmatory testing may be necessary for epidemiological  and / or clinical management purposes  to differentiate between  SARS-CoV-2 and other Sarbecovirus currently known to infect humans.  If clinically indicated additional testing with an alternate test  methodology [phone]) is advised. The SARS-CoV-2 RNA is generally  detectable in upper and lower respiratory sp ecimens during the acute  phase of infection. The expected result is Negative. Fact Sheet for Patients:  StrictlyIdeas.no Fact Sheet for Healthcare Providers: BankingDealers.co.za This test is not yet approved or cleared by the [place] FDA and has been authorized for detection and/or diagnosis of SARS-CoV-2 by FDA under an Emergency Use Authorization (EUA).  This EUA will remain in effect (meaning this test can be used) for the duration of the COVID-19 declaration under Section 564(b)(1) of the Act, 21 U.S.C. section 360bbb-3(b)(1), unless the authorization is terminated or revoked sooner. Performed at [facility], [place] [address] [place],  [address]  Dg Chest Port 1 View  Result Date: 08/25/2018 CLINICAL DATA:  Lower extremity edema.  Hypertension. EXAM: PORTABLE CHEST 1 VIEW COMPARISON:  February 14, 2011 FINDINGS: There is slight left base atelectasis. There is no edema or consolidation. Heart is mildly enlarged with pulmonary vascularity normal. No adenopathy. No bone lesions. IMPRESSION: Slight left base atelectasis. No edema or consolidation. Heart mildly enlarged. No  adenopathy. Electronically Signed   By: Lowella Grip III M.D.   On: 08/25/2018 16:20    Pending Labs Unresulted Labs (From admission, onward)    Start     Ordered   08/26/18 0500  CBC  Tomorrow morning,   R     08/25/18 1737   08/26/18 0500  Renal function panel  Tomorrow morning,   R     08/25/18 1737   08/25/18 1730  Protein, urine, random  Once,   STAT     08/25/18 1737   08/25/18 1730  Creatinine, urine, random  Once,   STAT     08/25/18 1737   08/25/18 1730  Sodium, urine, random  Once,   STAT     08/25/18 1737   08/25/18 1728  TSH  Add-on,   AD     08/25/18 1737          Vitals/Pain Today's Vitals   08/25/18 1415 08/25/18 1430 08/25/18 1445 08/25/18 1500  BP: (!) 162/85 (!) 153/95 (!) 160/99 (!) 160/98  Pulse: 79 78 71 66  Resp: 18 16 15 14   Temp:      TempSrc:      SpO2: 99% 97% 98% 97%  Weight:      Height:      PainSc:        Isolation Precautions No active isolations  Medications Medications  labetalol (NORMODYNE) tablet 200 mg (has no administration in time range)  metoprolol tartrate (LOPRESSOR) tablet 50 mg (has no administration in time range)  rosuvastatin (CRESTOR) tablet 10 mg (has no administration in time range)  amLODipine (NORVASC) tablet 10 mg (has no administration in time range)  sertraline (ZOLOFT) tablet 25 mg (has no administration in time range)  calcitRIOL (ROCALTROL) capsule 0.25 mcg (has no administration in time range)  tamsulosin (FLOMAX) capsule 0.4 mg (has no administration in time range)  SUMAtriptan (IMITREX) tablet 25 mg (has no administration in time range)  ondansetron (ZOFRAN) tablet 4 mg (has no administration in time range)    Or  ondansetron (ZOFRAN) injection 4 mg (has no administration in time range)  acetaminophen (TYLENOL) tablet 650 mg (has no administration in time range)    Or  acetaminophen (TYLENOL) suppository 650 mg (has no administration in time range)  sodium zirconium cyclosilicate (LOKELMA) packet  10 g (has no administration in time range)  hydrALAZINE (APRESOLINE) injection 10 mg (has no administration in time range)  furosemide (LASIX) injection 40 mg (has no administration in time range)    Mobility walks Low fall risk   Focused Assessments    R Recommendations: See Admitting Provider Note  Report given to: 20M RN  Additional Notes:

## 2018-08-25 NOTE — ED Provider Notes (Signed)
[facility] EMERGENCY DEPARTMENT Provider Note   CSN: [id number] Arrival date & time: 08/25/18  1156     History   Chief Complaint Chief Complaint  Patient presents with  . Pyelonephritis    HPI [name] is a 53 y.o. male who was sent in by Cardiology for in by cardiology for apparent repeat labs. THe patient ha a pmh of morbid obesity, ckd stage III with single kidney, profound Hypertriglyceridemia, Multiple med intolerances, OSA declining use of CPAP, HTN, and chronic back pain. The patient states that he is here for kidney infection, however I was able to seucre message his provider and it appears his kidney function I poor and there was elevated potassium on his blood work yesterday. The patient states that he has been feeling weak, and that he is "too fat and heavy" for his legs to carry him. He has had swelling in his lower legs which he states is chronic and comes and goes.  He says that he has a kidney doctor but he has not seen the doctor in many months.     HPI  Past Medical History:  Diagnosis Date  . Anemia   . Back pain   . CKD (chronic kidney disease), stage III ([place])   . Colon polyps    adenomatous  . Elevated cholesterol   . History of echocardiogram    Echo 11/17: EF 65-70, normal wall motion, grade 1 diastolic dysfunction, trivial MR, mild LAE, mild TR  . Hyperlipidemia   . Hypertension    no current bp meds for last 3 months  . Kidney stones 2007  . Medication intolerance    a. multiple with prior nonadherence to regimen.  . Otosclerosis of both ears   . PUD (peptic ulcer disease)    Has had unspecified surgery for this  . Sleep apnea     Patient Active Problem List   Diagnosis Date Noted  . Chronic primary headache 01/23/2018  . CKD (chronic kidney disease) stage 3, GFR 30-59 ml/min ([facility]) 05/10/2017  . Renal insufficiency 11/15/2016  . Acute on chronic diastolic CHF (congestive heart failure) ([place]) 10/07/2016  . Testicular  hypofunction 09/15/2015  . BPH without obstruction/lower urinary tract symptoms 09/15/2015  . Bilateral hearing loss 07/03/2015  . Mixed hearing loss of right ear 07/03/2015  . Chest pain 03/29/2013  . Hyperlipidemia 03/29/2013  . Nephrolithiasis 03/14/2013  . Acute blood loss anemia 03/14/2013  . Lumbar radiculopathy 02/26/2013  . Dyspnea 11/25/2011  . Leg pain, left 11/25/2011  . Vitamin D deficiency 08/03/2011  . Constipation 04/26/2011  . Uncontrolled hypertension 04/10/2010  . HLD (hyperlipidemia) 04/10/2010    Past Surgical History:  Procedure Laterality Date  . NEPHRECTOMY  02/18/2011   Procedure: NEPHRECTOMY;  Surgeon: Hanley Ben, MD;  Location: [facility];  Service: Urology;  Laterality: Right;  . SMALL INTESTINE SURGERY    . STAPEDOTOMY  2005   lt ear jan, right ear sept  . surgery for ulcers  1990        Home Medications    Prior to Admission medications   Medication Sig Start Date End Date Taking? Authorizing Provider  acetaminophen (TYLENOL) 500 MG tablet Take 1 tablet (500 mg total) by mouth every 6 (six) hours as needed for moderate pain. 02/01/17   Forrest Moron, MD  amLODipine (NORVASC) 10 MG tablet Take 10 mg by mouth daily.    [provider]  calcitRIOL (ROCALTROL) 0.25 MCG capsule Take 0.25 mcg by mouth  daily. 09/22/17   [provider]  Cetirizine HCl (ZYRTEC ALLERGY) 10 MG CAPS Take 1 capsule (10 mg total) by mouth daily. 03/15/17   Forrest Moron, MD  fenofibrate (TRICOR) 48 MG tablet Take 1 tablet (48 mg total) by mouth daily. 01/09/18   [name], MD  fluticasone (FLONASE) 50 MCG/ACT nasal spray Place 1 spray into both nostrils daily. 12/01/17   McVey, Gelene Mink, PA-C  labetalol (NORMODYNE) 200 MG tablet Take 200 mg by mouth 2 (two) times daily.    [provider]  omega-3 acid ethyl esters (LOVAZA) 1 g capsule Take 2 capsules (2 g total) by mouth 2 (two) times daily. 11/24/17   [name], MD   rosuvastatin (CRESTOR) 10 MG tablet Take 10 mg by mouth daily.    [provider]  sertraline (ZOLOFT) 25 MG tablet Take 1 tablet (25 mg total) by mouth daily. 08/24/18   Pieter Partridge, DO  SUMAtriptan (IMITREX) 50 MG tablet Take at the start of headache. May repeat in 2 hours if headache persists or recurs. 02/01/17   Forrest Moron, MD  tamsulosin (FLOMAX) 0.4 MG CAPS capsule Take 1 capsule by mouth at bedtime. 11/23/17   [provider]  Vitamin D, Ergocalciferol, (DRISDOL) 50000 units CAPS capsule Take 1 capsule (50,000 Units total) by mouth every 7 (seven) days. 02/01/17   Forrest [place], MD    Family History Family History  Problem Relation Age of Onset  . Hyperlipidemia Father   . Heart disease Mother   . Hypertension Mother   . Hypertension Sister   . Heart disease Brother 61       CABG  . Hyperlipidemia Sister   . Hyperlipidemia Sister   . Esophageal cancer Cousin   . Colon cancer Neg Hx   . Stomach cancer Neg Hx   . Rectal cancer Neg Hx     Social History Social History   Tobacco Use  . Smoking status: Never Smoker  . Smokeless tobacco: Never Used  Substance Use Topics  . Alcohol use: No  . Drug use: No     Allergies   Beef-derived products, Pegademase bovine, Poractant alfa, and Pork-derived products   Review of Systems Review of Systems  Ten systems reviewed and are negative for acute change, except as noted in the HPI.   Physical Exam Updated Vital Signs BP (!) 145/82 (BP Location: Right Arm)   Pulse 87   Temp 98.9 F (37.2 C) (Oral)   Resp 16   Ht 5\' 9"  (1.753 m)   Wt 122.5 kg   SpO2 98%   BMI 39.87 kg/m   Physical Exam Vitals signs and nursing note reviewed.  Constitutional:      General: He is not in acute distress.    Appearance: He is well-developed. He is not diaphoretic.  HENT:     Head: Normocephalic and atraumatic.  Eyes:     General: No scleral icterus.    Conjunctiva/sclera: Conjunctivae normal.  Neck:      Musculoskeletal: Normal range of motion and neck supple.  Cardiovascular:     Rate and Rhythm: Normal rate and regular rhythm.     Heart sounds: Normal heart sounds.  Pulmonary:     Effort: Pulmonary effort is normal. No respiratory distress.     Breath sounds: Normal breath sounds.  Abdominal:     Palpations: Abdomen is soft.     Tenderness: There is no abdominal tenderness. There is no right CVA  tenderness or left CVA tenderness.  Musculoskeletal:     Right lower leg: Edema present.     Left lower leg: Edema present.  Skin:    General: Skin is warm and dry.  Neurological:     Mental Status: He is alert.  Psychiatric:        Behavior: Behavior normal.      ED Treatments / Results  Labs (all labs ordered are listed, but only abnormal results are displayed) Labs Reviewed - No data to display  EKG None  Radiology No results found.  Procedures Procedures (including critical care time)  Medications Ordered in ED Medications - No data to display   Initial Impression / Assessment and Plan / ED Course  I have reviewed the triage vital signs and the nursing notes.  Pertinent labs & imaging results that were available during my care of the patient were reviewed by me and considered in my medical decision making (see chart for details).  Clinical Course as of Aug 24 1521  Sat Aug 25, 2018  1522 Creatinine(!): 3.80 [AH]  1522 BUN(!): 41 [AH]    Clinical Course User Index [AH] Neera Teng, PA-C       3:23 PM BP (!) 162/85   Pulse 79   Temp 98.9 F (37.2 C) (Oral)   Resp 18   Ht 5\' 9"  (1.753 m)   Wt 122.5 kg   SpO2 99%   BMI 39.87 kg/m  Patient here for repeat labs after cardiology saw the patient yesterday and he had elevated cr and K+.  I reviewed the patient's labs which show elevated urine protein, potassium 5.6, BUN of 41 creatinine of 3.7, decreased protein and albumin levels.  The last potassium I am able to see in our records was 1.81 on  04/12/2017.  Appears to have potential for nephrotic syndrome with low protein and albumin levels and proteinuria.  He has been seen in the past by [name].  The patient is complaining of fatigue and weakness which would correlate with acute renal failure.  I spoke with Dr. Tamala Julian who will admit the patient for further evaluation. Final Clinical Impressions(s) / ED Diagnoses   Final diagnoses:  None    ED Discharge Orders    None       Margarita Mail, PA-C 08/25/18 1555    Charlesetta Shanks, MD 08/26/18 [phone]

## 2018-08-25 NOTE — Telephone Encounter (Addendum)
Received critical lab call.  K+ is 5.0 and Creatinine 3.55. BMET    Component Value Date/Time   NA 136 08/24/2018 1458   K 6.0 (HH) 08/24/2018 1458   CL 100 08/24/2018 1458   CO2 18 (L) 08/24/2018 1458   GLUCOSE 84 08/24/2018 1458   GLUCOSE 91 12/09/2015 1328   BUN 46 (H) 08/24/2018 1458   CREATININE 3.55 (H) 08/24/2018 1458   CREATININE 1.53 (H) 12/09/2015 1328   CALCIUM 8.6 (L) 08/24/2018 1458   GFRNONAA 18 (L) 08/24/2018 1458   GFRNONAA 59 (L) 09/18/2014 1616   GFRAA 21 (L) 08/24/2018 1458   GFRAA 68 09/18/2014 1616      Left message on phone for patient to call back.  Wife phone # did not answer. He will need to go to the ED for repeat labs today. [name], PA-C    08/25/2018 8:06 AM    08/25/2018 10:00 AM >> Reached patient by phone.  I have advised him to go to the ED and he agrees to go to [facility]. [name], PA-C    08/25/2018 10:00 AM

## 2018-08-25 NOTE — Progress Notes (Signed)
Admitted via wheelchair from ED. Asessment as charted. Patient refused to have wallet and money locked in safe. Explained x 3 that hospital would not assume responsibility for loss. Patient stated he understood. Medication taken to pharmacy.

## 2018-08-25 NOTE — ED Triage Notes (Signed)
Pt arrives from home. Pt states that he was seen by his heart doctor yesterday where lab work was drawn. Pt states that his doctor called him today and told him to come be seen for a kidney infection.

## 2018-08-25 NOTE — ED Notes (Signed)
No addl blood draw,  Pt enroute to inpatient floor. 

## 2018-08-25 NOTE — H&P (Addendum)
History and Physical    [name] KDT:[id number] DOB: 05-Apr-1965 DOA: 08/25/2018  Referring MD/NP/PA: Margarita Mail, PA-C PCP: Forrest Moron, MD  Patient coming from: Home  Chief Complaint: Abnormal labs  I have personally briefly reviewed patient's old medical records in [place]   HPI: [name] is a 53 y.o. Arabic speaking male with medical history significant of hypertension, dyslipidemia, CKD stage IV followed by [name], diastolic dysfunction, anemia, and obesity; who presented after having lab work done at his cardiologist office yesterday and instructed to come into the hospital for further evaluation. Patient able to speaks some [place], but history is limited due to language barrier even with use of interpreter services. Patient reports that he was sent  a kidney infection.  Review of labs obtained following his cardiology from 7/10 revealed potassium 6, BUN 46, and creatinine 3.55.   Cardiology had set him up to have a echocardiogram on the 17th of this month.  He reports having lower extremity swelling with generalized generalized fatigue, and myalgias.  Other associated symptoms include complaints of headache and progressive abdominal swelling.  Patient still reports being able to make urine at this time.   ED Course: Upon admission into the emergency department patient was noted to be afebrile with blood pressure 162/100-210/101, and all other vital signs maintained.  Labs revealed WBC 6.4, hemoglobin 10.9, potassium 5.6, BUN 41, and creatinine 3.7.  Chest x-ray showed mildly enlarged heart without signs of edema.  Urinalysis was negative for signs of infection, but patient noted to have > = 300 protein present.  TRH was called to admit for suspected acute renal failure.  Review of Systems  Constitutional: Positive for malaise/fatigue. Negative for fever.  HENT: Negative for congestion and ear discharge.   Eyes: Negative for photophobia and pain.  Respiratory:  Negative for cough and shortness of breath.   Cardiovascular: Positive for leg swelling. Negative for chest pain.  Gastrointestinal: Negative for nausea and vomiting.  Genitourinary: Negative for dysuria and frequency.  Musculoskeletal: Positive for joint pain and myalgias.  Neurological: Positive for headaches. Negative for focal weakness and loss of consciousness.  Psychiatric/Behavioral: Negative for memory loss and substance abuse.    Past Medical History:  Diagnosis Date   Anemia    Back pain    CKD (chronic kidney disease), stage III (HCC)    Colon polyps    adenomatous   Elevated cholesterol    History of echocardiogram    Echo 11/17: EF 65-70, normal wall motion, grade 1 diastolic dysfunction, trivial MR, mild LAE, mild TR   Hyperlipidemia    Hypertension    no current bp meds for last 3 months   Kidney stones 2007   Medication intolerance    a. multiple with prior nonadherence to regimen.   Otosclerosis of both ears    PUD (peptic ulcer disease)    Has had unspecified surgery for this   Sleep apnea     Past Surgical History:  Procedure Laterality Date   NEPHRECTOMY  02/18/2011   Procedure: NEPHRECTOMY;  Surgeon: Hanley Ben, MD;  Location: [facility];  Service: Urology;  Laterality: Right;   SMALL INTESTINE SURGERY     STAPEDOTOMY  2005   lt ear jan, right ear sept   surgery for ulcers  1990     reports that he has never smoked. He has never used smokeless tobacco. He reports that he does not drink alcohol or use drugs.  Allergies  Allergen Reactions  Beef-Derived Products Other (See Comments)    Cultural preference   Pegademase Bovine Other (See Comments)    Cultural preference   Poractant Alfa Other (See Comments)    Cultural preference   Pork-Derived Products Other (See Comments)    Cultural preference    Family History  Problem Relation Age of Onset   Hyperlipidemia Father    Heart disease Mother    Hypertension Mother      Hypertension Sister    Heart disease Brother 44       CABG   Hyperlipidemia Sister    Hyperlipidemia Sister    Esophageal cancer Cousin    Colon cancer Neg Hx    Stomach cancer Neg Hx    Rectal cancer Neg Hx     Prior to Admission medications   Medication Sig Start Date End Date Taking? Authorizing Provider  acetaminophen (TYLENOL) 500 MG tablet Take 1 tablet (500 mg total) by mouth every 6 (six) hours as needed for moderate pain. 02/01/17  Yes Stallings, Zoe A, MD  amLODipine (NORVASC) 10 MG tablet Take 10 mg by mouth daily.   Yes [provider]  calcitRIOL (ROCALTROL) 0.25 MCG capsule Take 0.25 mcg by mouth daily. 09/22/17  Yes [provider]  Cetirizine HCl (ZYRTEC ALLERGY) 10 MG CAPS Take 1 capsule (10 mg total) by mouth daily. 03/15/17  Yes Forrest Moron, MD  cholecalciferol (VITAMIN D) 25 MCG (1000 UT) tablet Take 1,000 Units by mouth daily.   Yes [provider]  fenofibrate (TRICOR) 48 MG tablet Take 1 tablet (48 mg total) by mouth daily. 01/09/18  Yes [name], MD  labetalol (NORMODYNE) 200 MG tablet Take 200 mg by mouth 2 (two) times daily.   Yes [provider]  metoprolol tartrate (LOPRESSOR) 50 MG tablet Take 50 mg by mouth 2 (two) times daily.   Yes [provider]  rosuvastatin (CRESTOR) 10 MG tablet Take 10 mg by mouth daily.   Yes [provider]  sertraline (ZOLOFT) 25 MG tablet Take 1 tablet (25 mg total) by mouth daily. 08/24/18  Yes Jaffe, Adam R, DO  SUMAtriptan (IMITREX) 50 MG tablet Take at the start of headache. May repeat in 2 hours if headache persists or recurs. 02/01/17  Yes Delia Chimes A, MD  tamsulosin (FLOMAX) 0.4 MG CAPS capsule Take 1 capsule by mouth at bedtime. 11/23/17  Yes [provider]  omega-3 acid ethyl esters (LOVAZA) 1 g capsule Take 2 capsules (2 g total) by mouth 2 (two) times daily. Patient not taking: Reported on 08/25/2018 11/24/17   [name], MD     Physical Exam:  Constitutional: Obese male who appears to be in NAD, calm, comfortable Vitals:   08/25/18 1415 08/25/18 1430 08/25/18 1445 08/25/18 1500  BP: (!) 162/85 (!) 153/95 (!) 160/99 (!) 160/98  Pulse: 79 78 71 66  Resp: 18 16 15 14   Temp:      TempSrc:      SpO2: 99% 97% 98% 97%  Weight:      Height:       Eyes: PERRL, lids and conjunctivae normal ENMT: Mucous membranes are moist. Posterior pharynx clear of any exudate or lesions.  Neck: normal, supple, no masses, no thyromegaly Respiratory: clear to auscultation bilaterally, no wheezing, no crackles. Normal respiratory effort. No accessory muscle use.  Cardiovascular: Regular rate and rhythm, no murmurs / rubs / gallops.  2+ pitting lower extremity edema. 2+ pedal pulses. No carotid bruits.  Abdomen: Protuberant  abdomen with no tenderness, no masses palpated. No hepatosplenomegaly. Bowel sounds positive.  Musculoskeletal: no clubbing / cyanosis. No joint deformity upper and lower extremities. Good ROM, no contractures. Normal muscle tone.  Skin: no rashes, lesions, ulcers. No induration Neurologic: CN 2-12 grossly intact. Sensation intact, DTR normal. Strength 5/5 in all 4.  Psychiatric: Normal judgment and insight. Alert and oriented x 3. Normal mood.     Labs on Admission: I have personally reviewed following labs and imaging studies  CBC: Recent Labs  Lab 08/24/18 1458 08/25/18 1403 08/25/18 1412  WBC 7.0 6.4  --   NEUTROABS  --  4.3  --   HGB 11.1* 11.0* 10.9*  HCT 32.5* 33.8* 32.0*  MCV 82 87.8  --   PLT 192 180  --    Basic Metabolic Panel: Recent Labs  Lab 08/24/18 1458 08/25/18 1403 08/25/18 1412  NA 136 139 138  K 6.0* 5.6* 5.5*  CL 100 108 110  CO2 18* 22  --   GLUCOSE 84 106* 104*  BUN 46* 41* 41*  CREATININE 3.55* 3.70* 3.80*  CALCIUM 8.6* 8.4*  --    GFR: Estimated Creatinine Clearance: 29.1 mL/min (A) (by C-G formula based on SCr of 3.8 mg/dL (H)). Liver Function Tests: Recent  Labs  Lab 08/24/18 1458 08/25/18 1403  AST 11 19  ALT 13 17  ALKPHOS 77 70  BILITOT <0.2 0.5  PROT 5.8* 5.5*  ALBUMIN 3.7* 3.1*   Recent Labs  Lab 08/25/18 1403  LIPASE 42   No results for input(s): AMMONIA in the last 168 hours. Coagulation Profile: No results for input(s): INR, PROTIME in the last 168 hours. Cardiac Enzymes: No results for input(s): CKTOTAL, CKMB, CKMBINDEX, TROPONINI in the last 168 hours. BNP (last 3 results) No results for input(s): PROBNP in the last 8760 hours. HbA1C: No results for input(s): HGBA1C in the last 72 hours. CBG: No results for input(s): GLUCAP in the last 168 hours. Lipid Profile: Recent Labs    08/24/18 1458  CHOL 313*  HDL 24*  LDLCALC Comment  TRIG 1,340*  CHOLHDL 13.0*  LDLDIRECT 63   Thyroid Function Tests: No results for input(s): TSH, T4TOTAL, FREET4, T3FREE, THYROIDAB in the last 72 hours. Anemia Panel: No results for input(s): VITAMINB12, FOLATE, FERRITIN, TIBC, IRON, RETICCTPCT in the last 72 hours. Urine analysis:    Component Value Date/Time   COLORURINE STRAW (A) 08/25/2018 1436   APPEARANCEUR CLEAR 08/25/2018 1436   LABSPEC 1.011 08/25/2018 1436   PHURINE 6.0 08/25/2018 1436   GLUCOSEU NEGATIVE 08/25/2018 1436   HGBUR NEGATIVE 08/25/2018 1436   BILIRUBINUR NEGATIVE 08/25/2018 1436   BILIRUBINUR negative 09/23/2016 1546   BILIRUBINUR neg 03/12/2013 1058   KETONESUR NEGATIVE 08/25/2018 1436   PROTEINUR >=300 (A) 08/25/2018 1436   UROBILINOGEN 0.2 09/23/2016 1546   UROBILINOGEN 0.2 07/21/2012 1235   NITRITE NEGATIVE 08/25/2018 1436   LEUKOCYTESUR NEGATIVE 08/25/2018 1436   Sepsis Labs: Recent Results (from the past 240 hour(s))  SARS Coronavirus 2 (CEPHEID- Performed in [facility] hospital lab), Hosp Order     Status: None   Collection Time: 08/25/18  3:44 PM   Specimen: Nasopharyngeal Swab  Result Value Ref Range Status   SARS Coronavirus 2 NEGATIVE NEGATIVE Final    Comment: (NOTE) If result is  NEGATIVE SARS-CoV-2 target nucleic acids are NOT DETECTED. The SARS-CoV-2 RNA is generally detectable in upper and lower  respiratory specimens during the acute phase of infection. The lowest  concentration of SARS-CoV-2 viral copies  this assay can detect is 250  copies / mL. A negative result does not preclude SARS-CoV-2 infection  and should not be used as the sole basis for treatment or other  patient management decisions.  A negative result may occur with  improper specimen collection / handling, submission of specimen other  than nasopharyngeal swab, presence of viral mutation(s) within the  areas targeted by this assay, and inadequate number of viral copies  (<250 copies / mL). A negative result must be combined with clinical  observations, patient history, and epidemiological information. If result is POSITIVE SARS-CoV-2 target nucleic acids are DETECTED. The SARS-CoV-2 RNA is generally detectable in upper and lower  respiratory specimens dur ing the acute phase of infection.  Positive  results are indicative of active infection with SARS-CoV-2.  Clinical  correlation with patient history and other diagnostic information is  necessary to determine patient infection status.  Positive results do  not rule out bacterial infection or co-infection with other viruses. If result is PRESUMPTIVE POSTIVE SARS-CoV-2 nucleic acids MAY BE PRESENT.   A presumptive positive result was obtained on the submitted specimen  and confirmed on repeat testing.  While 2019 novel coronavirus  (SARS-CoV-2) nucleic acids may be present in the submitted sample  additional confirmatory testing may be necessary for epidemiological  and / or clinical management purposes  to differentiate between  SARS-CoV-2 and other Sarbecovirus currently known to infect humans.  If clinically indicated additional testing with an alternate test  methodology [phone]) is advised. The SARS-CoV-2 RNA is generally  detectable  in upper and lower respiratory sp ecimens during the acute  phase of infection. The expected result is Negative. Fact Sheet for Patients:  StrictlyIdeas.no Fact Sheet for Healthcare Providers: BankingDealers.co.za This test is not yet approved or cleared by the [place] FDA and has been authorized for detection and/or diagnosis of SARS-CoV-2 by FDA under an Emergency Use Authorization (EUA).  This EUA will remain in effect (meaning this test can be used) for the duration of the COVID-19 declaration under Section 564(b)(1) of the Act, 21 U.S.C. section 360bbb-3(b)(1), unless the authorization is terminated or revoked sooner. Performed at [facility], [place] [address] [place], [place] [address]      Radiological Exams on Admission: Dg Chest Port 1 View  Result Date: 08/25/2018 CLINICAL DATA:  Lower extremity edema.  Hypertension. EXAM: PORTABLE CHEST 1 VIEW COMPARISON:  February 14, 2011 FINDINGS: There is slight left base atelectasis. There is no edema or consolidation. Heart is mildly enlarged with pulmonary vascularity normal. No adenopathy. No bone lesions. IMPRESSION: Slight left base atelectasis. No edema or consolidation. Heart mildly enlarged. No adenopathy. Electronically Signed   By: Lowella Grip III M.D.   On: 08/25/2018 16:20    Chest x-ray: Independently reviewed.  Enlarged heart with no clear signs of edema noted.  Assessment/Plan Chronic kidney disease stage IV, proteinuria, solitary kidney: Chronic.  Patient presents with what was initially perceived to be acute renal failure. After talks with [name] of nephrology who contacted [name] it appears patient has a solitary kidney with baseline creatinine is from 3.5-4.  Has been discussed with the patient multiple times with the need for dialysis which he has been reluctant and reportedly went processes for a second opinion.  No further work-up was recommended  for acute renal failure, and likely can follow-up in the outpatient setting with [name]. -Admit to a telemetry bed -Renal diet -Check urine protein, creatinine, and sodium  Hyperkalemia: Acute.  Potassium 5.6 with repeat 5.5 on admission.  Nephrology recommended starting the patient on Lokelma and giving it to the patient 3 times a week. -Leukemia 10 mg x 1 dose now -Recheck potassium in a.m.  Question diastolic CHF exacerbation: Acute on chronic.  Patient reporting complaints of lower extremity swelling.  Chest x-ray showing cardiomegaly without signs of pulmonary edema.  BNP elevated at 251 with 2+ pitting lower extremity edema bilaterally.  Last echocardiogram in 2017 noted EF of 65-70% with grade 1 diastolic dysfunction.  However, ultimately symptoms could likely be related to patient need to be started on dialysis. -Strict I&O's and daily weight -Give 40 mg of Lasix IV x1 dose -Reasses need of continued IV Lasix in a.m. -Check echocardiogram -Consider discussing with cardiology in a.m.  Generalized weakness: Patient reports due to his weight and lower extremity leg swelling that he is unable to get around. -Follow-up TSH -PT to eval and treat  Uncontrolled hypertension: Pressures elevated up to 210/101 on admission. -Continue labetalol, metoprolol, and amlodipine -Hydralazine IV as needed  Dyslipidemia: Lipid panel obtained from 7/10 noted total cholesterol 313, HDL 24, direct LDL 63, and triglyceride 1340. -Continue atorvastatin  -Increase fenofibrate to 60 mg daily  DVT prophylaxis: SCDs due to hypersensitivity Code Status: Full Family Communication: no family present bedside Disposition Plan: Likely discharge home in a.m. Consults called: Nephrology Admission status: Inpatient  Norval Morton MD [facility] Pager [phone]   If 7PM-7AM, please contact night-coverage [email or web address] Password [facility]  08/25/2018, 5:02 PM

## 2018-08-26 ENCOUNTER — Observation Stay (HOSPITAL_BASED_OUTPATIENT_CLINIC_OR_DEPARTMENT_OTHER): Payer: BLUE CROSS/BLUE SHIELD

## 2018-08-26 DIAGNOSIS — N184 Chronic kidney disease, stage 4 (severe): Secondary | ICD-10-CM | POA: Diagnosis not present

## 2018-08-26 DIAGNOSIS — G4730 Sleep apnea, unspecified: Secondary | ICD-10-CM | POA: Diagnosis not present

## 2018-08-26 DIAGNOSIS — Z6838 Body mass index (BMI) 38.0-38.9, adult: Secondary | ICD-10-CM | POA: Diagnosis not present

## 2018-08-26 DIAGNOSIS — I10 Essential (primary) hypertension: Secondary | ICD-10-CM

## 2018-08-26 DIAGNOSIS — R51 Headache: Secondary | ICD-10-CM | POA: Diagnosis not present

## 2018-08-26 DIAGNOSIS — Z79899 Other long term (current) drug therapy: Secondary | ICD-10-CM | POA: Diagnosis not present

## 2018-08-26 DIAGNOSIS — N179 Acute kidney failure, unspecified: Secondary | ICD-10-CM | POA: Diagnosis not present

## 2018-08-26 DIAGNOSIS — I130 Hypertensive heart and chronic kidney disease with heart failure and stage 1 through stage 4 chronic kidney disease, or unspecified chronic kidney disease: Secondary | ICD-10-CM | POA: Diagnosis not present

## 2018-08-26 DIAGNOSIS — R2689 Other abnormalities of gait and mobility: Secondary | ICD-10-CM | POA: Diagnosis not present

## 2018-08-26 DIAGNOSIS — I5033 Acute on chronic diastolic (congestive) heart failure: Secondary | ICD-10-CM | POA: Diagnosis not present

## 2018-08-26 DIAGNOSIS — E875 Hyperkalemia: Secondary | ICD-10-CM

## 2018-08-26 DIAGNOSIS — E785 Hyperlipidemia, unspecified: Secondary | ICD-10-CM | POA: Diagnosis not present

## 2018-08-26 DIAGNOSIS — E781 Pure hyperglyceridemia: Secondary | ICD-10-CM | POA: Diagnosis not present

## 2018-08-26 DIAGNOSIS — I517 Cardiomegaly: Secondary | ICD-10-CM

## 2018-08-26 DIAGNOSIS — Z1159 Encounter for screening for other viral diseases: Secondary | ICD-10-CM | POA: Diagnosis not present

## 2018-08-26 LAB — RENAL FUNCTION PANEL
Albumin: 3.1 g/dL — ABNORMAL LOW (ref 3.5–5.0)
Anion gap: 11 (ref 5–15)
BUN: 44 mg/dL — ABNORMAL HIGH (ref 6–20)
CO2: 19 mmol/L — ABNORMAL LOW (ref 22–32)
Calcium: 8.5 mg/dL — ABNORMAL LOW (ref 8.9–10.3)
Chloride: 108 mmol/L (ref 98–111)
Creatinine, Ser: 3.77 mg/dL — ABNORMAL HIGH (ref 0.61–1.24)
GFR calc Af Amer: 20 mL/min — ABNORMAL LOW (ref >60)
GFR calc non Af Amer: 17 mL/min — ABNORMAL LOW (ref >60)
Glucose, Bld: 100 mg/dL — ABNORMAL HIGH (ref 70–99)
Phosphorus: 7 mg/dL — ABNORMAL HIGH (ref 2.5–4.6)
Potassium: 5.9 mmol/L — ABNORMAL HIGH (ref 3.5–5.1)
Sodium: 138 mmol/L (ref 135–145)

## 2018-08-26 LAB — BASIC METABOLIC PANEL
Anion gap: 9 (ref 5–15)
BUN: 47 mg/dL — ABNORMAL HIGH (ref 6–20)
CO2: 22 mmol/L (ref 22–32)
Calcium: 8.4 mg/dL — ABNORMAL LOW (ref 8.9–10.3)
Chloride: 109 mmol/L (ref 98–111)
Creatinine, Ser: 4.1 mg/dL — ABNORMAL HIGH (ref 0.61–1.24)
GFR calc Af Amer: 18 mL/min — ABNORMAL LOW (ref >60)
GFR calc non Af Amer: 16 mL/min — ABNORMAL LOW (ref >60)
Glucose, Bld: 104 mg/dL — ABNORMAL HIGH (ref 70–99)
Potassium: 4.5 mmol/L (ref 3.5–5.1)
Sodium: 140 mmol/L (ref 135–145)

## 2018-08-26 LAB — CBC
HCT: 33.3 % — ABNORMAL LOW (ref 39.0–52.0)
Hemoglobin: 10.8 g/dL — ABNORMAL LOW (ref 13.0–17.0)
MCH: 28.3 pg (ref 26.0–34.0)
MCHC: 32.4 g/dL (ref 30.0–36.0)
MCV: 87.4 fL (ref 80.0–100.0)
Platelets: 173 10*3/uL (ref 150–400)
RBC: 3.81 MIL/uL — ABNORMAL LOW (ref 4.22–5.81)
RDW: 13 % (ref 11.5–15.5)
WBC: 6.5 10*3/uL (ref 4.0–10.5)
nRBC: 0 % (ref 0.0–0.2)

## 2018-08-26 LAB — ECHOCARDIOGRAM COMPLETE
Height: 69 in
Weight: 4160 oz

## 2018-08-26 MED ORDER — SODIUM POLYSTYRENE SULFONATE 15 GM/60ML PO SUSP
45.0000 g | Freq: Once | ORAL | Status: AC
  Administered 2018-08-26: 45 g via ORAL
  Filled 2018-08-26: qty 180

## 2018-08-26 MED ORDER — CALCIUM ACETATE (PHOS BINDER) 667 MG PO CAPS
1334.0000 mg | ORAL_CAPSULE | Freq: Three times a day (TID) | ORAL | Status: DC
  Administered 2018-08-26 – 2018-08-27 (×4): 1334 mg via ORAL
  Filled 2018-08-26 (×4): qty 2

## 2018-08-26 MED ORDER — FUROSEMIDE 40 MG PO TABS
40.0000 mg | ORAL_TABLET | Freq: Two times a day (BID) | ORAL | Status: DC
  Administered 2018-08-26: 40 mg via ORAL
  Filled 2018-08-26: qty 1

## 2018-08-26 MED ORDER — SODIUM ZIRCONIUM CYCLOSILICATE 10 G PO PACK: 10.0000 g | PACK | Freq: Three times a day (TID) | ORAL | Status: DC

## 2018-08-26 MED ORDER — SODIUM ZIRCONIUM CYCLOSILICATE 10 G PO PACK
10.0000 g | PACK | Freq: Two times a day (BID) | ORAL | Status: DC
  Administered 2018-08-26 – 2018-08-27 (×3): 10 g via ORAL
  Filled 2018-08-26 (×4): qty 1

## 2018-08-26 MED ORDER — FUROSEMIDE 40 MG PO TABS
40.0000 mg | ORAL_TABLET | Freq: Every day | ORAL | Status: DC
  Administered 2018-08-27: 40 mg via ORAL
  Filled 2018-08-26: qty 1

## 2018-08-26 MED ORDER — LABETALOL HCL 200 MG PO TABS: 400.0000 mg | ORAL_TABLET | Freq: Two times a day (BID) | ORAL | Status: DC

## 2018-08-26 MED ORDER — LABETALOL HCL 200 MG PO TABS
200.0000 mg | ORAL_TABLET | Freq: Two times a day (BID) | ORAL | Status: DC
  Administered 2018-08-26: 200 mg via ORAL
  Filled 2018-08-26: qty 1

## 2018-08-26 NOTE — Progress Notes (Addendum)
PROGRESS NOTE    [name]  NLG:[id number] DOB: 27-Dec-1965 DOA: 08/25/2018 PCP: Forrest Moron, MD    Brief Narrative:  HPI per Dr. Sheilah Barnett is a 53 y.o. Arabic speaking male with medical history significant of hypertension, dyslipidemia, CKD stage IV followed by Dr. Justin Barnett, diastolic dysfunction, anemia, and obesity; who presented after having lab work done at his cardiologist office yesterday and instructed to come into the hospital for further evaluation. Patient able to speaks some [place], but history is limited due to language barrier even with use of interpreter services. Patient reports that he was sent  a kidney infection.  Review of labs obtained following his cardiology from 7/10 revealed potassium 6, BUN 46, and creatinine 3.55.   Cardiology had set him up to have a echocardiogram on the 17th of this month.  He reports having lower extremity swelling with generalized generalized fatigue, and myalgias.  Other associated symptoms include complaints of headache and progressive abdominal swelling.  Patient still reports being able to make urine at this time.   ED Course: Upon admission into the emergency department patient was noted to be afebrile with blood pressure 162/100-210/101, and all other vital signs maintained.  Labs revealed WBC 6.4, hemoglobin 10.9, potassium 5.6, BUN 41, and creatinine 3.7.  Chest x-ray showed mildly enlarged heart without signs of edema.  Urinalysis was negative for signs of infection, but patient noted to have > = 300 protein present.  TRH was called to admit for suspected acute renal failure.  Assessment & Plan:   Principal Problem:   Hyperkalemia Active Problems:   Uncontrolled hypertension   Acute on chronic diastolic CHF (congestive heart failure) (HCC)   Chronic primary headache   CKD (chronic kidney disease) stage 4, GFR 15-29 ml/min (HCC)  1 hyperkalemia Likely secondary to progressive chronic kidney disease stage IV.  Potassium on  presentation to the ED was 5.6 with repeat at 5.5.  Patient given a dose of Lokelma and IV Lasix.  Despite IV Lasix and Lokelma potassium levels increased and 5.9 this morning.  EKG done with no peak T waves.  Curb sided nephrology who had recommended placing patient on Lokelma twice daily with addition of oral Kayexalate 45 g p.o. x1.  We will also place patient on oral Lasix 40 mg daily.  Repeat potassium this afternoon and in the morning.  If resolution of hyperkalemia tomorrow patient will likely need to be discharged on daily low, with close outpatient follow-up with nephrology for repeat labs within the week.  Follow.  2.  Progressive chronic kidney disease stage IV/proteinuria/solitary kidney Patient initially had presented with what initially was perceived to be acute renal failure.  Admitting physician spoke with nephrologist on-call Dr. Jonnie Barnett who contacted patient's primary nephrologist, Dr. Justin Barnett and it appears patient has a solitary kidney with baseline creatinine from 3.5-4.  The need for hemodialysis has been discussed multiple times with patient but it is noted that patient has been reluctant and reportedly in the process of trying to seek a second opinion.  No further work-up recommended at this time.  Is recommended that patient follow-up with his nephrologist, Dr. Justin Barnett in the outpatient setting post discharge.  3.  Questionable diastolic CHF exacerbation acute on chronic versus volume overload Patient noted to have complaints of lower extremity edema and some abdominal distention.  Chest x-ray done showed cardiomegaly without signs of pulmonary edema.  Patient noted on admission to have 2+ pitting lower extremity edema as well as elevated BNP  of 251.  2D echo from 2017 with a EF of 65 to 70% with grade 1 diastolic dysfunction.  Volume overload could be likely secondary to progressive chronic kidney disease.  Patient given a dose of Lasix 40 mg IV x1 on admission.  Patient noted to have a  urine output of 1.6 L over the past 24 hours.  Clinical improvement in terms of improved lower extremity edema and abdominal distention.  2D echo pending.  Will transition patient to oral Lasix 40 mg daily.  Will need outpatient follow-up.  4.  Uncontrolled hypertension Patient on presentation noted to have a blood pressure of 210/101.  Patient also with complaints of headache.  Likely secondary to noncompliance.  EKG with significant findings of LVH.  Continue labetalol 200 mg twice daily.  Continue Norvasc at 10 mg daily.  Will discontinue metoprolol.  Patient to be started on Lasix 40 mg daily.  IV hydralazine as needed.  If better blood pressure control is needed could uptitrate labetalol to 400 mg twice daily as was initially intended by cardiology however patient reluctant to do that.  Medical compliance stressed to patient.  5.  Hyperlipidemia Fasting lipid panel from 08/24/2018 with a total cholesterol of 313, HDL of 24, direct LDL of 63, triglycerides of 1340.  Continue statin.  Fenofibrate has been increased to 160 daily.  Continue statin.  Outpatient follow-up.  6.  Hyperphosphatemia Likely secondary to chronic kidney disease.  Will place on PhosLo.  Outpatient follow-up with nephrology.   DVT prophylaxis: SCDs Code Status: Full Family Communication: Updated patient.  No family at bedside. Disposition Plan: Likely home once hyperkalemia has resolved hopefully tomorrow.   Consultants:   Curb sided nephrology  Procedures:   2D echo 08/26/2018  Chest x-ray 08/25/2018  Antimicrobials:   None   Subjective: Patient laying in bed.  Denies any shortness of breath.  Denies any chest pain.  States some improvement with his abdominal distention and lower extremity edema after IV Lasix.  Patient states making urine.  Objective: Vitals:   08/25/18 2003 08/26/18 0518 08/26/18 0523 08/26/18 0853  BP: (!) 172/94 131/77  (!) 158/93  Pulse: 80 78  79  Resp: (!) 22 20  18   Temp: 98.3  F (36.8 C) 97.9 F (36.6 C)  98.2 F (36.8 C)  TempSrc: Oral Oral  Oral  SpO2: 100% 99%  100%  Weight:   117.9 kg   Height:        Intake/Output Summary (Last 24 hours) at 08/26/2018 1309 Last data filed at 08/26/2018 1223 Gross per 24 hour  Intake 1080 ml  Output 2000 ml  Net -920 ml   Filed Weights   08/25/18 1223 08/25/18 1958 08/26/18 0523  Weight: 122.5 kg 119.7 kg 117.9 kg    Examination:  General exam: Appears calm and comfortable  Respiratory system: Clear to auscultation. Respiratory effort normal. Cardiovascular system: S1 & S2 heard, RRR. No JVD, murmurs, rubs, gallops or clicks.  1+ bilateral lower extremity edema.   Gastrointestinal system: Abdomen is obese, soft, mildly distended, positive bowel sounds.  No rebound.  No guarding.  Central nervous system: Alert and oriented. No focal neurological deficits. Extremities: 1+ bilateral lower extremity edema.  Symmetric 5 x 5 power. Skin: No rashes, lesions or ulcers Psychiatry: Judgement and insight appear normal. Mood & affect appropriate.     Data Reviewed: I have personally reviewed following labs and imaging studies  CBC: Recent Labs  Lab 08/24/18 1458 08/25/18 1403 08/25/18 1412 08/26/18  0353  WBC 7.0 6.4  --  6.5  NEUTROABS  --  4.3  --   --   HGB 11.1* 11.0* 10.9* 10.8*  HCT 32.5* 33.8* 32.0* 33.3*  MCV 82 87.8  --  87.4  PLT 192 180  --  854   Basic Metabolic Panel: Recent Labs  Lab 08/24/18 1458 08/25/18 1403 08/25/18 1412 08/26/18 0353  NA 136 139 138 138  K 6.0* 5.6* 5.5* 5.9*  CL 100 108 110 108  CO2 18* 22  --  19*  GLUCOSE 84 106* 104* 100*  BUN 46* 41* 41* 44*  CREATININE 3.55* 3.70* 3.80* 3.77*  CALCIUM 8.6* 8.4*  --  8.5*  PHOS  --   --   --  7.0*   GFR: Estimated Creatinine Clearance: 28.7 mL/min (A) (by C-G formula based on SCr of 3.77 mg/dL (H)). Liver Function Tests: Recent Labs  Lab 08/24/18 1458 08/25/18 1403 08/26/18 0353  AST 11 19  --   ALT 13 17  --    ALKPHOS 77 70  --   BILITOT <0.2 0.5  --   PROT 5.8* 5.5*  --   ALBUMIN 3.7* 3.1* 3.1*   Recent Labs  Lab 08/25/18 1403  LIPASE 42   No results for input(s): AMMONIA in the last 168 hours. Coagulation Profile: No results for input(s): INR, PROTIME in the last 168 hours. Cardiac Enzymes: No results for input(s): CKTOTAL, CKMB, CKMBINDEX, TROPONINI in the last 168 hours. BNP (last 3 results) No results for input(s): PROBNP in the last 8760 hours. HbA1C: No results for input(s): HGBA1C in the last 72 hours. CBG: No results for input(s): GLUCAP in the last 168 hours. Lipid Profile: Recent Labs    08/24/18 1458  CHOL 313*  HDL 24*  LDLCALC Comment  TRIG 1,340*  CHOLHDL 13.0*  LDLDIRECT 63   Thyroid Function Tests: Recent Labs    08/25/18 2034  TSH 1.125   Anemia Panel: No results for input(s): VITAMINB12, FOLATE, FERRITIN, TIBC, IRON, RETICCTPCT in the last 72 hours. Sepsis Labs: No results for input(s): PROCALCITON, LATICACIDVEN in the last 168 hours.  Recent Results (from the past 240 hour(s))  SARS Coronavirus 2 (CEPHEID- Performed in [place] hospital lab), Hosp Order     Status: None   Collection Time: 08/25/18  3:44 PM   Specimen: Nasopharyngeal Swab  Result Value Ref Range Status   SARS Coronavirus 2 NEGATIVE NEGATIVE Final    Comment: (NOTE) If result is NEGATIVE SARS-CoV-2 target nucleic acids are NOT DETECTED. The SARS-CoV-2 RNA is generally detectable in upper and lower  respiratory specimens during the acute phase of infection. The lowest  concentration of SARS-CoV-2 viral copies this assay can detect is 250  copies / mL. A negative result does not preclude SARS-CoV-2 infection  and should not be used as the sole basis for treatment or other  patient management decisions.  A negative result may occur with  improper specimen collection / handling, submission of specimen other  than nasopharyngeal swab, presence of viral mutation(s) within the   areas targeted by this assay, and inadequate number of viral copies  (<250 copies / mL). A negative result must be combined with clinical  observations, patient history, and epidemiological information. If result is POSITIVE SARS-CoV-2 target nucleic acids are DETECTED. The SARS-CoV-2 RNA is generally detectable in upper and lower  respiratory specimens dur ing the acute phase of infection.  Positive  results are indicative of active infection with SARS-CoV-2.  Clinical  correlation with patient history and other diagnostic information is  necessary to determine patient infection status.  Positive results do  not rule out bacterial infection or co-infection with other viruses. If result is PRESUMPTIVE POSTIVE SARS-CoV-2 nucleic acids MAY BE PRESENT.   A presumptive positive result was obtained on the submitted specimen  and confirmed on repeat testing.  While 2019 novel coronavirus  (SARS-CoV-2) nucleic acids may be present in the submitted sample  additional confirmatory testing may be necessary for epidemiological  and / or clinical management purposes  to differentiate between  SARS-CoV-2 and other Sarbecovirus currently known to infect humans.  If clinically indicated additional testing with an alternate test  methodology [phone]) is advised. The SARS-CoV-2 RNA is generally  detectable in upper and lower respiratory sp ecimens during the acute  phase of infection. The expected result is Negative. Fact Sheet for Patients:  StrictlyIdeas.no Fact Sheet for Healthcare Providers: BankingDealers.co.za This test is not yet approved or cleared by the [place] FDA and has been authorized for detection and/or diagnosis of SARS-CoV-2 by FDA under an Emergency Use Authorization (EUA).  This EUA will remain in effect (meaning this test can be used) for the duration of the COVID-19 declaration under Section 564(b)(1) of the Act, 21 U.S.C.  section 360bbb-3(b)(1), unless the authorization is terminated or revoked sooner. Performed at [facility], [place] [address], [place], [place] [address]          Radiology Studies: Dg Chest Port 1 View  Result Date: 08/25/2018 CLINICAL DATA:  Lower extremity edema.  Hypertension. EXAM: PORTABLE CHEST 1 VIEW COMPARISON:  February 14, 2011 FINDINGS: There is slight left base atelectasis. There is no edema or consolidation. Heart is mildly enlarged with pulmonary vascularity normal. No adenopathy. No bone lesions. IMPRESSION: Slight left base atelectasis. No edema or consolidation. Heart mildly enlarged. No adenopathy. Electronically Signed   By: Lowella Grip III M.D.   On: 08/25/2018 16:20        Scheduled Meds: . amLODipine  10 mg Oral Daily  . calcitRIOL  0.25 mcg Oral Daily  . calcium acetate  1,334 mg Oral TID WC  . fenofibrate  160 mg Oral Daily  . furosemide  40 mg Oral BID  . labetalol  200 mg Oral BID  . metoprolol tartrate  50 mg Oral BID  . rosuvastatin  10 mg Oral Daily  . sertraline  25 mg Oral Daily  . sodium polystyrene  45 g Oral Once  . sodium zirconium cyclosilicate  10 g Oral BID  . tamsulosin  0.4 mg Oral QHS   Continuous Infusions:   LOS: 1 day    Time spent: 35 minutes    Irine Seal, MD [facility]  If 7PM-7AM, please contact night-coverage [email or web address] 08/26/2018, 1:09 PM

## 2018-08-26 NOTE — Evaluation (Signed)
Physical Therapy Evaluation Patient Details Name: [name] MRN: [id number] DOB: 04/20/1965 Today's Date: 08/26/2018   History of Present Illness  Tyreque Finken is a 53 y.o. male who was sent in by Cardiology for in by cardiology for apparent repeat labs. THe patient ha a pmh of morbid obesity, ckd stage III with single kidney, profound Hypertriglyceridemia, Multiple med intolerances, OSA declining use of CPAP, HTN, and chronic back pain.  Clinical Impression   Patient evaluated by Physical Therapy with no further acute PT needs identified. All education has been completed and the patient has no further questions. Recommend Mr. John walk the hallways daily while in hospital;  See below for any follow-up Physical Therapy or equipment needs. PT is signing off. Thank you for this referral.     Follow Up Recommendations No PT follow up    Equipment Recommendations  None recommended by PT    Recommendations for Other Services OT consult     Precautions / Restrictions Precautions Precautions: Fall Restrictions Weight Bearing Restrictions: No      Mobility  Bed Mobility Overal bed mobility: Independent                Transfers Overall transfer level: Independent   Transfers: Sit to/from Stand Sit to Stand: Modified independent (Device/Increase time)            Ambulation/Gait Ambulation/Gait assistance: Modified independent (Device/Increase time);Independent Gait Distance (Feet): 300 Feet Assistive device: None Gait Pattern/deviations: Step-through pattern     General Gait Details: Slow gait, but overall steady; one instance of slight loss of balance which he recovered from independently  Stairs            Wheelchair Mobility    Modified Rankin (Stroke Patients Only)       Balance                                             Pertinent Vitals/Pain Pain Assessment: Faces Faces Pain Scale: Hurts a little bit Pain Location:  headache Pain Descriptors / Indicators: Aching Pain Intervention(s): Monitored during session    Home Living Family/patient expects to be discharged to:: Private residence Living Arrangements: Spouse/significant other;Children Available Help at Discharge: Family;Available 24 hours/day Type of Home: House Home Access: Stairs to enter   [employer] of Steps: 2 Home Layout: One level Home Equipment: None      Prior Function Level of Independence: Needs assistance   Gait / Transfers Assistance Needed: independent without device  ADL's / Homemaking Assistance Needed: Wife or daughter assist with socks and shoes.        Hand Dominance        Extremity/Trunk Assessment   Upper Extremity Assessment Upper Extremity Assessment: Overall WFL for tasks assessed    Lower Extremity Assessment Lower Extremity Assessment: Overall WFL for tasks assessed       Communication   Communication: Prefers language other than English(per RN, no interpreter needed)  Cognition Arousal/Alertness: Awake/alert Behavior During Therapy: WFL for tasks assessed/performed Overall Cognitive Status: Within Functional Limits for tasks assessed                                        General Comments      Exercises     Assessment/Plan    PT Assessment  Patent does not need any further PT services  PT Problem List         PT Treatment Interventions      PT Goals (Current goals can be found in the Care Plan section)  Acute Rehab PT Goals Patient Stated Goal: to feel stronger PT Goal Formulation: All assessment and education complete, DC therapy    Frequency     Barriers to discharge        Co-evaluation               AM-PAC PT "6 Clicks" Mobility  Outcome Measure Help needed turning from your back to your side while in a flat bed without using bedrails?: None Help needed moving from lying on your back to sitting on the side of a flat bed without  using bedrails?: None Help needed moving to and from a bed to a chair (including a wheelchair)?: None Help needed standing up from a chair using your arms (e.g., wheelchair or bedside chair)?: None Help needed to walk in hospital room?: None Help needed climbing 3-5 steps with a railing? : None 6 Click Score: 24    End of Session Equipment Utilized During Treatment: Gait belt Activity Tolerance: Patient tolerated treatment well Patient left: in chair;with call bell/phone within reach Nurse Communication: Mobility status PT Visit Diagnosis: Other abnormalities of gait and mobility (R26.89)    Time: 7737-3668 PT Time Calculation (min) (ACUTE ONLY): 16 min   Charges:   PT Evaluation $PT Eval Low Complexity: [place], [place] Pager [phone] Office [place] 08/26/2018, 5:29 PM

## 2018-08-26 NOTE — Progress Notes (Signed)
Called and updated patient's wife

## 2018-08-26 NOTE — Progress Notes (Signed)
  Echocardiogram 2D Echocardiogram has been performed.  Jennette Dubin 08/26/2018, 9:21 AM

## 2018-08-26 NOTE — Progress Notes (Signed)
Occupational Therapy Evaluation Patient Details Name: [name] MRN: [id number] DOB: 1965/07/27 Today's Date: 08/26/2018    History of Present Illness [name] is a 53 y.o. male who was sent in by Cardiology for in by cardiology for apparent repeat labs. THe patient ha a pmh of morbid obesity, ckd stage III with single kidney, profound Hypertriglyceridemia, Multiple med intolerances, OSA declining use of CPAP, HTN, and chronic back pain.   Clinical Impression   Pt admitted s/p above events.  PTA he is independent and lives at home with wife and daughter. Pt reports he typically needs assist with LB ADLs.  Today, pt reports that he still feels a little weak and more tired than usual, complaining of headache. Supervision for functional mobility and ADLs except for LB ADLs for which he requires min assist.  Will f/u with at least one more session to provide AE education in order to maximize independence and safety with ADLs.     Follow Up Recommendations  No OT follow up    Equipment Recommendations  None recommended by OT    Recommendations for Other Services       Precautions / Restrictions Precautions Precautions: Fall Restrictions Weight Bearing Restrictions: No      Mobility Bed Mobility Overal bed mobility: Independent                Transfers Overall transfer level: Needs assistance   Transfers: Sit to/from Stand Sit to Stand: Supervision              Balance                                           ADL either performed or assessed with clinical judgement   ADL Overall ADL's : Needs assistance/impaired     Grooming: Brushing hair;Wash/dry face;Supervision/safety   Upper Body Bathing: Supervision/ safety;Sitting   Lower Body Bathing: Sit to/from stand;Minimal assistance Lower Body Bathing Details (indicate cue type and reason): unable to reach feet or perform figure four position Upper Body Dressing :  Supervision/safety;Sitting   Lower Body Dressing: Minimal assistance;Sit to/from stand Lower Body Dressing Details (indicate cue type and reason): min assist doffing/donning socks. prefers to wear flip flops with ambulation Toilet Transfer: Supervision/safety;Ambulation   Toileting- Clothing Manipulation and Hygiene: Supervision/safety;Sit to/from stand       Functional mobility during ADLs: Supervision/safety       Vision Baseline Vision/History: Wears glasses Wears Glasses: Reading only       Perception     Praxis      Pertinent Vitals/Pain Pain Assessment: Faces Faces Pain Scale: Hurts a little bit Pain Location: headache Pain Descriptors / Indicators: Aching Pain Intervention(s): Monitored during session     Hand Dominance     Extremity/Trunk Assessment Upper Extremity Assessment Upper Extremity Assessment: Overall WFL for tasks assessed   Lower Extremity Assessment Lower Extremity Assessment: Defer to PT evaluation       Communication Communication Communication: Prefers language other than English(per RN, no interpreter needed)   Cognition Arousal/Alertness: Awake/alert Behavior During Therapy: WFL for tasks assessed/performed Overall Cognitive Status: Within Functional Limits for tasks assessed                                     General Comments       Exercises  Shoulder Instructions      Home Living Family/patient expects to be discharged to:: Private residence Living Arrangements: Spouse/significant other;Children Available Help at Discharge: Family;Available 24 hours/day Type of Home: House Home Access: Stairs to enter [employer] of Steps: 2   Home Layout: One level     Bathroom Shower/Tub: Occupational psychologist: Standard     Home Equipment: None          Prior Functioning/Environment Level of Independence: Needs assistance  Gait / Transfers Assistance Needed: independent without  device ADL's / Homemaking Assistance Needed: Wife or daughter assist with socks and shoes.            OT Problem List: Decreased knowledge of use of DME or AE      OT Treatment/Interventions: Self-care/ADL training;DME and/or AE instruction;Patient/family education    OT Goals(Current goals can be found in the care plan section) Acute Rehab OT Goals Patient Stated Goal: to feel stronger OT Goal Formulation: With patient Time For Goal Achievement: 09/09/18 Potential to Achieve Goals: Good  OT Frequency: Min 2X/week   Barriers to D/C:            Co-evaluation              AM-PAC OT "6 Clicks" Daily Activity     Outcome Measure Help from another person eating meals?: None Help from another person taking care of personal grooming?: None Help from another person toileting, which includes using toliet, bedpan, or urinal?: None Help from another person bathing (including washing, rinsing, drying)?: A Little Help from another person to put on and taking off regular upper body clothing?: None Help from another person to put on and taking off regular lower body clothing?: A Little 6 Click Score: 22   End of Session Nurse Communication: Mobility status  Activity Tolerance: Patient tolerated treatment well Patient left: in chair;with call bell/phone within reach  OT Visit Diagnosis: Muscle weakness (generalized) (M62.81);Unsteadiness on feet (R26.81)                Time: 1610-9604 OT Time Calculation (min): 18 min Charges:  OT General Charges $OT Visit: 1 Visit OT Evaluation $OT Eval Low Complexity: [place], [place] OTR/L [place] [phone] 08/26/2018, 3:02 PM

## 2018-08-27 ENCOUNTER — Telehealth: Payer: Self-pay | Admitting: Physician Assistant

## 2018-08-27 DIAGNOSIS — E875 Hyperkalemia: Secondary | ICD-10-CM | POA: Diagnosis not present

## 2018-08-27 DIAGNOSIS — I5033 Acute on chronic diastolic (congestive) heart failure: Secondary | ICD-10-CM | POA: Diagnosis not present

## 2018-08-27 DIAGNOSIS — N184 Chronic kidney disease, stage 4 (severe): Secondary | ICD-10-CM | POA: Diagnosis not present

## 2018-08-27 DIAGNOSIS — I10 Essential (primary) hypertension: Secondary | ICD-10-CM | POA: Diagnosis not present

## 2018-08-27 LAB — CBC
HCT: 33.6 % — ABNORMAL LOW (ref 39.0–52.0)
Hemoglobin: 10.7 g/dL — ABNORMAL LOW (ref 13.0–17.0)
MCH: 27.9 pg (ref 26.0–34.0)
MCHC: 31.8 g/dL (ref 30.0–36.0)
MCV: 87.5 fL (ref 80.0–100.0)
Platelets: 170 10*3/uL (ref 150–400)
RBC: 3.84 MIL/uL — ABNORMAL LOW (ref 4.22–5.81)
RDW: 12.8 % (ref 11.5–15.5)
WBC: 6.1 10*3/uL (ref 4.0–10.5)
nRBC: 0 % (ref 0.0–0.2)

## 2018-08-27 LAB — IRON AND TIBC
Iron: 61 ug/dL (ref 45–182)
Saturation Ratios: 22 % (ref 17.9–39.5)
TIBC: 276 ug/dL (ref 250–450)
UIBC: 215 ug/dL

## 2018-08-27 LAB — RENAL FUNCTION PANEL
Albumin: 3.1 g/dL — ABNORMAL LOW (ref 3.5–5.0)
Anion gap: 11 (ref 5–15)
BUN: 46 mg/dL — ABNORMAL HIGH (ref 6–20)
CO2: 22 mmol/L (ref 22–32)
Calcium: 8.2 mg/dL — ABNORMAL LOW (ref 8.9–10.3)
Chloride: 107 mmol/L (ref 98–111)
Creatinine, Ser: 4.15 mg/dL — ABNORMAL HIGH (ref 0.61–1.24)
GFR calc Af Amer: 18 mL/min — ABNORMAL LOW (ref >60)
GFR calc non Af Amer: 15 mL/min — ABNORMAL LOW (ref >60)
Glucose, Bld: 101 mg/dL — ABNORMAL HIGH (ref 70–99)
Phosphorus: 6.5 mg/dL — ABNORMAL HIGH (ref 2.5–4.6)
Potassium: 4.2 mmol/L (ref 3.5–5.1)
Sodium: 140 mmol/L (ref 135–145)

## 2018-08-27 LAB — VITAMIN B12: Vitamin B-12: 340 pg/mL (ref 180–914)

## 2018-08-27 LAB — FERRITIN: Ferritin: 155 ng/mL (ref 24–336)

## 2018-08-27 LAB — FOLATE: Folate: 12 ng/mL (ref >5.9)

## 2018-08-27 MED ORDER — SODIUM ZIRCONIUM CYCLOSILICATE 10 G PO PACK: 10.0000 g | PACK | Freq: Two times a day (BID) | ORAL | 0 refills | Status: DC

## 2018-08-27 MED ORDER — FUROSEMIDE 40 MG PO TABS: 40.0000 mg | ORAL_TABLET | Freq: Every day | ORAL | 0 refills | Status: DC

## 2018-08-27 MED ORDER — LABETALOL HCL 200 MG PO TABS: 400.0000 mg | ORAL_TABLET | Freq: Two times a day (BID) | ORAL | 0 refills | Status: DC

## 2018-08-27 MED ORDER — CALCIUM ACETATE (PHOS BINDER) 667 MG PO CAPS: 1334.0000 mg | ORAL_CAPSULE | Freq: Three times a day (TID) | ORAL | 0 refills | Status: DC

## 2018-08-27 MED ORDER — LABETALOL HCL 200 MG PO TABS
400.0000 mg | ORAL_TABLET | Freq: Two times a day (BID) | ORAL | Status: DC
  Administered 2018-08-27: 400 mg via ORAL
  Filled 2018-08-27: qty 2

## 2018-08-27 MED ORDER — AMLODIPINE BESYLATE 10 MG PO TABS: 10.0000 mg | ORAL_TABLET | Freq: Every day | ORAL | 0 refills | Status: DC

## 2018-08-27 MED ORDER — FENOFIBRATE 160 MG PO TABS: 160.0000 mg | ORAL_TABLET | Freq: Every day | ORAL | 0 refills | Status: DC

## 2018-08-27 NOTE — Discharge Summary (Signed)
Physician Discharge Summary  [name] HGD:[id number] DOB: 05-20-1965 DOA: 08/25/2018  PCP: Forrest Moron, MD  Admit date: 08/25/2018 Discharge date: 08/27/2018  Time spent: 60 minutes  Recommendations for Outpatient Follow-up:  1. Follow-up with [name] on 09/03/2018 at 1:30 PM.  On follow-up patient will need a renal panel done to follow-up on electrolytes and renal function.  Further recommendations in terms of patient's medications will need to be done at that time. 2. Follow-up with [facility] on 08/29/2018 for lab work.  Results will need to be faxed and sent to [name] of nephrology. 3. Follow-up with Forrest Moron, MD in 2 weeks.  On follow-up patient's hypertriglyceridemia will need to be followed up upon as well as his hypertension and other chronic medical issues. 4.     Discharge Diagnoses:  Principal Problem:   Hyperkalemia Active Problems:   Uncontrolled hypertension   Acute on chronic diastolic CHF (congestive heart failure) (HCC)   Chronic primary headache   CKD (chronic kidney disease) stage 4, GFR 15-29 ml/min (HCC)   Discharge Condition: Stable and improved  Diet recommendation: Renal diet  Filed Weights   08/25/18 1958 08/26/18 0523 08/27/18 0500  Weight: 119.7 kg 117.9 kg 117.9 kg    History of present illness:  HPI per [name] is a 53 y.o. Arabic speaking male with medical history significant of hypertension, dyslipidemia, CKD stage IV followed by [name], diastolic dysfunction, anemia, and obesity; who presented after having lab work done at his cardiologist office yesterday and instructed to come into the hospital for further evaluation. Patient able to speaks some [place], but history is limited due to language barrier even with use of interpreter services. Patient reports that he was sent  a kidney infection.  Review of labs obtained following his cardiology from 7/10 revealed potassium 6, BUN 46, and creatinine 3.55.   Cardiology had set  him up to have a echocardiogram on the 17th of this month.  He reports having lower extremity swelling with generalized generalized fatigue, and myalgias.  Other associated symptoms include complaints of headache and progressive abdominal swelling.  Patient still reports being able to make urine at this time.   ED Course: Upon admission into the emergency department patient was noted to be afebrile with blood pressure 162/100-210/101, and all other vital signs maintained.  Labs revealed WBC 6.4, hemoglobin 10.9, potassium 5.6, BUN 41, and creatinine 3.7.  Chest x-ray showed mildly enlarged heart without signs of edema.  Urinalysis was negative for signs of infection, but patient noted to have > = 300 protein present.  TRH was called to admit for suspected acute renal failure.  Hospital Course:  1 hyperkalemia Likely secondary to progressive chronic kidney disease stage IV.  Potassium on presentation to the ED was 5.6 with repeat at 5.5.  Patient given a dose of Lokelma and IV Lasix.  Despite IV Lasix and Lokelma potassium levels increased and 5.9 this morning.  EKG done with no peak T waves.  Curb sided nephrology who had recommended placing patient on Lokelma twice daily with addition of oral Kayexalate 45 g p.o. x1.    Patient also placed on Lasix 40 mg daily.  Patient's hyperkalemia on this regimen resolved such that by day of discharge potassium was down to 4.2.  Patient improved clinically.  Patient be discharged home in stable and improved condition will need lab work done by [employer] this week.  Patient is also to follow-up with [name],  nephrology as scheduled on 09/03/2018.  On follow-up will be determined per nephrology as to whether patient is to continue on [facility] and further recommendations in terms of his chronic kidney disease.   2.  Progressive chronic kidney disease stage IV/proteinuria/solitary kidney Patient initially had presented with what initially was perceived to be acute renal  failure.  Admitting physician spoke with nephrologist on-call [name] who contacted patient's primary nephrologist, [name] and it appears patient has a solitary kidney with baseline creatinine from 3.5-4.  The need for hemodialysis has been discussed multiple times with patient but it is noted that patient has been reluctant and reportedly in the process of trying to seek a second opinion.  No further work-up recommended at this time.  Is recommended that patient follow-up with his nephrologist, [name] in the outpatient setting post discharge.    3.  Questionable diastolic CHF exacerbation acute on chronic versus volume overload Patient noted to have complaints of lower extremity edema and some abdominal distention.  Chest x-ray done showed cardiomegaly without signs of pulmonary edema.  Patient noted on admission to have 2+ pitting lower extremity edema as well as elevated BNP of 251.  2D echo from 2017 with a EF of 65 to 70% with grade 1 diastolic dysfunction.  Volume overload could be likely secondary to progressive chronic kidney disease.  Patient given a dose of Lasix 40 mg IV x1 on admission.  Patient noted to have good urine output during the hospitalization.  Patient improved clinically with improvement of lower extremity edema.  Patient subsequently transition to oral Lasix 40 mg daily that he will be discharged home.  2D echo which was done showed a EF of 60 to 65%, left ventricular diastolic Doppler parameters consistent with impaired relaxation.  Borderline dilatation of aortic root measuring 39 mm.  Patient improved clinically and be discharged in stable and improved condition.  Outpatient follow-up with nephrology.    4.  Uncontrolled hypertension Patient on presentation noted to have a blood pressure of 210/101.  Patient also with complaints of headache.  Likely secondary to noncompliance.  EKG with significant findings of LVH.    Patient placed on Norvasc 10 mg daily and continued on  labetalol 200 mg twice daily and started on Lasix 40 mg daily after receiving a dose of IV Lasix.  Labetalol dose was subsequently increased to 400 mg twice daily.  Patient's blood pressure improved on this regimen.  Patient be discharged home on labetalol 400 mg twice daily, Norvasc 10 mg daily, Lasix 40 mg daily.  Outpatient follow-up with PCP.   5.  Hyperlipidemia Fasting lipid panel from 08/24/2018 with a total cholesterol of 313, HDL of 24, direct LDL of 63, triglycerides of 1340.    Patient maintained on statin.  Fenofibrate was increased to 160 mg daily.  Outpatient follow-up.    6.  Hyperphosphatemia Likely secondary to chronic kidney disease.  Patient was started on PhosLo.  Outpatient follow-up with nephrology.   Procedures:  2D echo 08/26/2018  Chest x-ray 08/25/2018  Consultations:  Curb sided nephrology  Discharge Exam: Vitals:   08/27/18 0849 08/27/18 0850  BP: (!) 148/82 (!) 148/82  Pulse:  86  Resp:  18  Temp:  (!) 97.5 F (36.4 C)  SpO2:  96%    General: NAD Cardiovascular: RRR, no murmurs rubs or gallops.  No JVD.  Trace bilateral lower extremity edema. Respiratory: Clear to auscultation bilaterally.  Discharge Instructions   Discharge Instructions    Diet -  low sodium heart healthy   Complete by: As directed    Renal diet   Increase activity slowly   Complete by: As directed      Allergies as of 08/27/2018      Reactions   Beef-derived Products Other (See Comments)   Cultural preference   Pegademase Bovine Other (See Comments)   Cultural preference   Poractant Alfa Other (See Comments)   Cultural preference   Pork-derived Products Other (See Comments)   Cultural preference      Medication List    STOP taking these medications   metoprolol tartrate 50 MG tablet Commonly known as: LOPRESSOR     TAKE these medications   acetaminophen 500 MG tablet Commonly known as: TYLENOL Take 1 tablet (500 mg total) by mouth every 6 (six) hours as  needed for moderate pain.   amLODipine 10 MG tablet Commonly known as: NORVASC Take 1 tablet (10 mg total) by mouth daily.   calcitRIOL 0.25 MCG capsule Commonly known as: ROCALTROL Take 0.25 mcg by mouth daily.   calcium acetate 667 MG capsule Commonly known as: PHOSLO Take 2 capsules (1,334 mg total) by mouth 3 (three) times daily with meals.   Cetirizine HCl 10 MG Caps Commonly known as: ZyrTEC Allergy Take 1 capsule (10 mg total) by mouth daily.   cholecalciferol 25 MCG (1000 UT) tablet Commonly known as: VITAMIN D Take 1,000 Units by mouth daily.   fenofibrate 160 MG tablet Take 1 tablet (160 mg total) by mouth daily. Start taking on: August 28, 2018 What changed:   medication strength  how much to take   furosemide 40 MG tablet Commonly known as: LASIX Take 1 tablet (40 mg total) by mouth daily. Start taking on: August 28, 2018   labetalol 200 MG tablet Commonly known as: NORMODYNE Take 2 tablets (400 mg total) by mouth 2 (two) times daily. What changed: how much to take   omega-3 acid ethyl esters 1 g capsule Commonly known as: Lovaza Take 2 capsules (2 g total) by mouth 2 (two) times daily.   rosuvastatin 10 MG tablet Commonly known as: CRESTOR Take 10 mg by mouth daily.   sertraline 25 MG tablet Commonly known as: Zoloft Take 1 tablet (25 mg total) by mouth daily.   sodium zirconium cyclosilicate 10 g Pack packet Commonly known as: LOKELMA Take 10 g by mouth 2 (two) times daily.   SUMAtriptan 50 MG tablet Commonly known as: IMITREX Take at the start of headache. May repeat in 2 hours if headache persists or recurs.   tamsulosin 0.4 MG Caps capsule Commonly known as: FLOMAX Take 1 capsule by mouth at bedtime.      Allergies  Allergen Reactions  . Beef-Derived Products Other (See Comments)    Cultural preference  . Pegademase Bovine Other (See Comments)    Cultural preference  . Poractant Alfa Other (See Comments)    Cultural preference  .  Pork-Derived Products Other (See Comments)    Cultural preference   Follow-up Information    Edrick Oh, MD Follow up on 09/03/2018.   Specialty: Nephrology Why: f/u at 130pm be there 69mins earlier. Contact information: [place] [address] [phone]        [facility] Follow up on 08/29/2018.   Why: [place] FROM 8-4P       Stallings, Zoe A, MD. Schedule an appointment as soon as possible for a visit in 2 week(s).   Specialty:  Internal Medicine Contact information: [place] [address] [phone]        [name], MD .   Specialty: Cardiology Contact information: [address] [place] [address] [phone]            The results of significant diagnostics from this hospitalization (including imaging, microbiology, ancillary and laboratory) are listed below for reference.    Significant Diagnostic Studies: Dg Chest Port 1 View  Result Date: 08/25/2018 CLINICAL DATA:  Lower extremity edema.  Hypertension. EXAM: PORTABLE CHEST 1 VIEW COMPARISON:  February 14, 2011 FINDINGS: There is slight left base atelectasis. There is no edema or consolidation. Heart is mildly enlarged with pulmonary vascularity normal. No adenopathy. No bone lesions. IMPRESSION: Slight left base atelectasis. No edema or consolidation. Heart mildly enlarged. No adenopathy. Electronically Signed   By: Lowella Grip III M.D.   On: 08/25/2018 16:20    Microbiology: Recent Results (from the past 240 hour(s))  SARS Coronavirus 2 (CEPHEID- Performed in [place] hospital lab), Hosp Order     Status: None   Collection Time: 08/25/18  3:44 PM   Specimen: Nasopharyngeal Swab  Result Value Ref Range Status   SARS Coronavirus 2 NEGATIVE NEGATIVE Final    Comment: (NOTE) If result is NEGATIVE SARS-CoV-2 target nucleic acids are NOT DETECTED. The SARS-CoV-2 RNA is generally detectable in upper and lower   respiratory specimens during the acute phase of infection. The lowest  concentration of SARS-CoV-2 viral copies this assay can detect is 250  copies / mL. A negative result does not preclude SARS-CoV-2 infection  and should not be used as the sole basis for treatment or other  patient management decisions.  A negative result may occur with  improper specimen collection / handling, submission of specimen other  than nasopharyngeal swab, presence of viral mutation(s) within the  areas targeted by this assay, and inadequate number of viral copies  (<250 copies / mL). A negative result must be combined with clinical  observations, patient history, and epidemiological information. If result is POSITIVE SARS-CoV-2 target nucleic acids are DETECTED. The SARS-CoV-2 RNA is generally detectable in upper and lower  respiratory specimens dur ing the acute phase of infection.  Positive  results are indicative of active infection with SARS-CoV-2.  Clinical  correlation with patient history and other diagnostic information is  necessary to determine patient infection status.  Positive results do  not rule out bacterial infection or co-infection with other viruses. If result is PRESUMPTIVE POSTIVE SARS-CoV-2 nucleic acids MAY BE PRESENT.   A presumptive positive result was obtained on the submitted specimen  and confirmed on repeat testing.  While 2019 novel coronavirus  (SARS-CoV-2) nucleic acids may be present in the submitted sample  additional confirmatory testing may be necessary for epidemiological  and / or clinical management purposes  to differentiate between  SARS-CoV-2 and other Sarbecovirus currently known to infect humans.  If clinically indicated additional testing with an alternate test  methodology [id number]) is advised. The SARS-CoV-2 RNA is generally  detectable in upper and lower respiratory sp ecimens during the acute  phase of infection. The expected result is Negative. Fact  Sheet for Patients:  StrictlyIdeas.no Fact Sheet for Healthcare Providers: BankingDealers.co.za This test is not yet approved or cleared by the [place] FDA and has been authorized for detection and/or diagnosis of SARS-CoV-2 by FDA under an Emergency Use Authorization (EUA).  This EUA will remain in effect (meaning this test can be  used) for the duration of the COVID-19 declaration under Section 564(b)(1) of the Act, 21 U.S.C. section 360bbb-3(b)(1), unless the authorization is terminated or revoked sooner. Performed at [facility], [address], [place], [place] [address]      Labs: Basic Metabolic Panel: Recent Labs  Lab 08/24/18 1458 08/25/18 1403 08/25/18 1412 08/26/18 0353 08/26/18 1753 08/27/18 0421  NA 136 139 138 138 140 140  K 6.0* 5.6* 5.5* 5.9* 4.5 4.2  CL 100 108 110 108 109 107  CO2 18* 22  --  19* 22 22  GLUCOSE 84 106* 104* 100* 104* 101*  BUN 46* 41* 41* 44* 47* 46*  CREATININE 3.55* 3.70* 3.80* 3.77* 4.10* 4.15*  CALCIUM 8.6* 8.4*  --  8.5* 8.4* 8.2*  PHOS  --   --   --  7.0*  --  6.5*   Liver Function Tests: Recent Labs  Lab 08/24/18 1458 08/25/18 1403 08/26/18 0353 08/27/18 0421  AST 11 19  --   --   ALT 13 17  --   --   ALKPHOS 77 70  --   --   BILITOT <0.2 0.5  --   --   PROT 5.8* 5.5*  --   --   ALBUMIN 3.7* 3.1* 3.1* 3.1*   Recent Labs  Lab 08/25/18 1403  LIPASE 42   No results for input(s): AMMONIA in the last 168 hours. CBC: Recent Labs  Lab 08/24/18 1458 08/25/18 1403 08/25/18 1412 08/26/18 0353 08/27/18 0421  WBC 7.0 6.4  --  6.5 6.1  NEUTROABS  --  4.3  --   --   --   HGB 11.1* 11.0* 10.9* 10.8* 10.7*  HCT 32.5* 33.8* 32.0* 33.3* 33.6*  MCV 82 87.8  --  87.4 87.5  PLT 192 180  --  173 170   Cardiac Enzymes: No results for input(s): CKTOTAL, CKMB, CKMBINDEX, TROPONINI in the last 168 hours. BNP: BNP (last 3 results) Recent Labs    08/25/18 1403  BNP  251.6*    ProBNP (last 3 results) No results for input(s): PROBNP in the last 8760 hours.  CBG: No results for input(s): GLUCAP in the last 168 hours.     Signed:  Irine Seal MD.  [facility] 08/27/2018, 12:56 PM

## 2018-08-27 NOTE — Progress Notes (Signed)
DISCHARGE NOTE HOME Eean Buss to be discharged Home per MD order. Discussed prescriptions and follow up appointments with the patient. Prescriptions given to patient; medication list explained in detail. Patient verbalized understanding.  Skin clean, dry and intact without evidence of skin break down, no evidence of skin tears noted. IV catheter discontinued intact. Site without signs and symptoms of complications. Dressing and pressure applied. Pt denies pain at the site currently. No complaints noted.  Patient free of lines, drains, and wounds.   An After Visit Summary (AVS) was printed and given to the patient. Patient escorted via wheelchair, and discharged home via private auto.  Paulla Fore, RN

## 2018-08-27 NOTE — Telephone Encounter (Signed)
Opened in error

## 2018-08-27 NOTE — Progress Notes (Signed)
Occupational Therapy Treatment and Discharge Patient Details Name: [name] MRN: [id number] DOB: [birth year] Today's Date: 08/27/2018    History of present illness Stratton Villwock is a 53 y.o. male who was sent in by Cardiology for in by cardiology for apparent repeat labs. THe patient ha a pmh of morbid obesity, ckd stage III with single kidney, profound Hypertriglyceridemia, Multiple med intolerances, OSA declining use of CPAP, HTN, and chronic back pain.   OT comments  Educated pt in use of AE, demonstrated understanding. Pt is functioning modified independently in ADL and independently in mobility. Goals met, discharging from OT.  Follow Up Recommendations  No OT follow up    Equipment Recommendations  None recommended by OT    Recommendations for Other Services      Precautions / Restrictions Precautions Precautions: None Restrictions Weight Bearing Restrictions: No       Mobility Bed Mobility Overal bed mobility: Independent             General bed mobility comments: HOB flat  Transfers Overall transfer level: Independent                    Balance                                           ADL either performed or assessed with clinical judgement   ADL Overall ADL's : Modified independent                         Toilet Transfer: Independent           Functional mobility during ADLs: Independent General ADL Comments: Pt educated in use of AE for LB ADL and returned understanding.     Vision       Perception     Praxis      Cognition Arousal/Alertness: Awake/alert Behavior During Therapy: WFL for tasks assessed/performed Overall Cognitive Status: Within Functional Limits for tasks assessed                                          Exercises     Shoulder Instructions       General Comments      Pertinent Vitals/ Pain       Pain Assessment: No/denies pain  Home Living                                           Prior Functioning/Environment              Frequency           Progress Toward Goals  OT Goals(current goals can now be found in the care plan section)  Progress towards OT goals: Goals met/education completed, patient discharged from OT  Acute Rehab OT Goals Patient Stated Goal: to feel stronger OT Goal Formulation: With patient Time For Goal Achievement: 09/09/18 Potential to Achieve Goals: Good  Plan All goals met and education completed, patient discharged from OT services    Co-evaluation                 AM-PAC OT "6 Clicks" Daily Activity  Outcome Measure   Help from another person eating meals?: None Help from another person taking care of personal grooming?: None Help from another person toileting, which includes using toliet, bedpan, or urinal?: None Help from another person bathing (including washing, rinsing, drying)?: None Help from another person to put on and taking off regular upper body clothing?: None Help from another person to put on and taking off regular lower body clothing?: None 6 Click Score: 24    End of Session    OT Visit Diagnosis: Unsteadiness on feet (R26.81)   Activity Tolerance Patient tolerated treatment well   Patient Left in bed;with call bell/phone within reach   Nurse Communication          Time: 9892-1194 OT Time Calculation (min): 18 min  Charges: OT General Charges $OT Visit: 1 Visit OT Treatments $Self Care/Home Management : 8-22 mins  Nestor Lewandowsky, OTR/L Acute Rehabilitation Services Pager: [phone] Office: [phone] Malka So 08/27/2018, 9:15 AM

## 2018-08-27 NOTE — Telephone Encounter (Signed)
Thank you Nicki Reaper!

## 2018-08-29 DIAGNOSIS — N184 Chronic kidney disease, stage 4 (severe): Secondary | ICD-10-CM | POA: Diagnosis not present

## 2018-08-29 DIAGNOSIS — N2581 Secondary hyperparathyroidism of renal origin: Secondary | ICD-10-CM | POA: Diagnosis not present

## 2018-08-29 NOTE — ED Notes (Signed)
Pt came to sort nurse, he was called to come pick up some medications he left in room.  This nurse called [place], charge nurse.  Nurse to bring down to ED lobby to patient.

## 2018-08-30 ENCOUNTER — Other Ambulatory Visit (HOSPITAL_COMMUNITY): Payer: BLUE CROSS/BLUE SHIELD

## 2018-09-03 DIAGNOSIS — D631 Anemia in chronic kidney disease: Secondary | ICD-10-CM | POA: Diagnosis not present

## 2018-09-03 DIAGNOSIS — N184 Chronic kidney disease, stage 4 (severe): Secondary | ICD-10-CM | POA: Diagnosis not present

## 2018-09-03 DIAGNOSIS — M5136 Other intervertebral disc degeneration, lumbar region: Secondary | ICD-10-CM | POA: Diagnosis not present

## 2018-09-03 DIAGNOSIS — I129 Hypertensive chronic kidney disease with stage 1 through stage 4 chronic kidney disease, or unspecified chronic kidney disease: Secondary | ICD-10-CM | POA: Diagnosis not present

## 2018-09-03 DIAGNOSIS — G894 Chronic pain syndrome: Secondary | ICD-10-CM | POA: Diagnosis not present

## 2018-09-03 DIAGNOSIS — Z905 Acquired absence of kidney: Secondary | ICD-10-CM | POA: Diagnosis not present

## 2018-09-12 ENCOUNTER — Other Ambulatory Visit: Payer: Self-pay

## 2018-09-12 ENCOUNTER — Encounter: Payer: Self-pay | Admitting: Family Medicine

## 2018-09-12 ENCOUNTER — Ambulatory Visit (INDEPENDENT_AMBULATORY_CARE_PROVIDER_SITE_OTHER): Payer: BLUE CROSS/BLUE SHIELD | Admitting: Family Medicine

## 2018-09-12 VITALS — BP 133/74 | HR 85 | Temp 98.7°F | Resp 17 | Ht 69.0 in | Wt 265.6 lb

## 2018-09-12 DIAGNOSIS — N184 Chronic kidney disease, stage 4 (severe): Secondary | ICD-10-CM | POA: Diagnosis not present

## 2018-09-12 DIAGNOSIS — R51 Headache: Secondary | ICD-10-CM | POA: Diagnosis not present

## 2018-09-12 DIAGNOSIS — E559 Vitamin D deficiency, unspecified: Secondary | ICD-10-CM

## 2018-09-12 DIAGNOSIS — I10 Essential (primary) hypertension: Secondary | ICD-10-CM | POA: Diagnosis not present

## 2018-09-12 DIAGNOSIS — E781 Pure hyperglyceridemia: Secondary | ICD-10-CM | POA: Diagnosis not present

## 2018-09-12 DIAGNOSIS — R519 Headache, unspecified: Secondary | ICD-10-CM

## 2018-09-12 MED ORDER — VITAMIN D (ERGOCALCIFEROL) 1.25 MG (50000 UNIT) PO CAPS: 50000.0000 [IU] | ORAL_CAPSULE | ORAL | 6 refills | Status: DC

## 2018-09-12 MED ORDER — ESOMEPRAZOLE MAGNESIUM 40 MG PO CPDR: 40.0000 mg | DELAYED_RELEASE_CAPSULE | Freq: Every day | ORAL | 3 refills | Status: DC

## 2018-09-12 NOTE — Progress Notes (Signed)
Established Patient Office Visit  Subjective:  Patient ID: [name], male    DOB: Sep 03, 1965  Age: 53 y.o. MRN: [id number]  CC:  Chief Complaint  Patient presents with  . Hospitalization Follow-up    HPI [name] presents for follow up after hospitalization He was in [place] from January to May 2020  CKD Patient was admitted for renal failure in the ER  He was admitted from 08/25/2018 - 08/27/2018 He states that he was not feeling well and was diagnosed with ARF, CKD Stage 4 Lab Results  Component Value Date   CREATININE 4.34 ([place]) 09/12/2018   He will be seeing [name] at [facility] on 09/21/2018 He states that he will have to see [name] every 2 weeks He should also attend a class called [facility]ney [facility]: 365     IMPRESSIONS 08/26/2018   1. The left ventricle has normal systolic function with an ejection fraction of 60-65%. The cavity size was normal. Left ventricular diastolic Doppler parameters are consistent with impaired relaxation.  2. The right ventricle has normal systolic function. The cavity was normal. There is no increase in right ventricular wall thickness. Right ventricular systolic pressure could not be assessed.  3. There is borderline dilatation of the aortic root measuring 39 mm. The ascending aorta is not well visualized, but appears to measure 42 mm at the proximal-mid ascending aorta, possible mild dilation.  4. The aortic valve is grossly normal. No stenosis of the aortic valve.  5. Left atrial size was mildly dilated.  6. The interatrial septum was not well visualized.  Headaches and Neuropathy Neurology stopped NSAIDs and put him on tylenol He states that he is on gabapentin and it is helping his headaches and neuropathy. He also has an appointment for a sleep study  He reports that he was not given any medication for his anemia He has stopped caffeine which is helping the headaches  Hypertension: Patient here for follow-up of elevated blood  pressure. He is not exercising and is adherent to low salt diet.  Blood pressure is well controlled at home and is much improved compared to previous. Cardiac symptoms none. Patient denies chest pain, chest pressure/discomfort, dyspnea, exertional chest pressure/discomfor.  Cardiovascular risk factors: hypertension, male gender, obesity (BMI >= 30 kg/m2) and sedentary lifestyle. Use of agents associated with hypertension: none. History of target organ damage: chronic kidney disease. BP Readings from Last 3 Encounters:  09/12/18 133/74  08/27/18 (!) 148/82  08/24/18 (!) 162/100    Past Medical History:  Diagnosis Date  . Anemia   . Back pain   . CKD (chronic kidney disease), stage III ([place])   . Colon polyps    adenomatous  . Elevated cholesterol   . History of echocardiogram    Echo 11/17: EF 65-70, normal wall motion, grade 1 diastolic dysfunction, trivial MR, mild LAE, mild TR  . Hyperlipidemia   . Hypertension    no current bp meds for last 3 months  . Kidney stones 2007  . Medication intolerance    a. multiple with prior nonadherence to regimen.  . Otosclerosis of both ears   . PUD (peptic ulcer disease)    Has had unspecified surgery for this  . Sleep apnea     Past Surgical History:  Procedure Laterality Date  . NEPHRECTOMY  02/18/2011   Procedure: NEPHRECTOMY;  Surgeon: Hanley Ben, MD;  Location: [facility];  Service: Urology;  Laterality: Right;  . SMALL INTESTINE SURGERY    .  STAPEDOTOMY  2005   lt ear jan, right ear sept  . surgery for ulcers  1990    Family History  Problem Relation Age of Onset  . Hyperlipidemia Father   . Heart disease Mother   . Hypertension Mother   . Hypertension Sister   . Heart disease Brother 55       CABG  . Hyperlipidemia Sister   . Hyperlipidemia Sister   . Esophageal cancer Cousin   . Colon cancer Neg Hx   . Stomach cancer Neg Hx   . Rectal cancer Neg Hx     Social History   Socioeconomic History  . Marital status:  Married    Spouse name: Edgardo Roys  . Number of children: 5  . Years of education: Not on file  . Highest education level: 12th grade  Occupational History    Employer: [employer]  Social Needs  . Financial resource strain: Not hard at all  . Food insecurity    Worry: Never true    Inability: Never true  . Transportation needs    Medical: No    Non-medical: No  Tobacco Use  . Smoking status: Never Smoker  . Smokeless tobacco: Never Used  Substance and Sexual Activity  . Alcohol use: No  . Drug use: No  . Sexual activity: Yes    Birth control/protection: None  Lifestyle  . Physical activity    Days per week: 0 days    Minutes per session: 0 min  . Stress: Not at all  Relationships  . Social connections    Talks on phone: More than three times a week    Gets together: More than three times a week    Attends religious service: More than 4 times per year    Active member of club or organization: No    Attends meetings of clubs or organizations: Never    Relationship status: Married  . Intimate partner violence    Fear of current or ex partner: No    Emotionally abused: No    Physically abused: No    Forced sexual activity: No  Other Topics Concern  . Not on file  Social History Narrative   Lives at home with wife and family. He is from [place]. Came to the [place] in 2002.      Patient is right-handed. He lives in a one level home. He drinks 1-2 cups of coffee and tea a day. He does not exercise.    Outpatient Medications Prior to Visit  Medication Sig Dispense Refill  . acetaminophen (TYLENOL) 500 MG tablet Take 1 tablet (500 mg total) by mouth every 6 (six) hours as needed for moderate pain. 30 tablet 0  . amLODipine (NORVASC) 10 MG tablet Take 1 tablet (10 mg total) by mouth daily. 30 tablet 0  . calcitRIOL (ROCALTROL) 0.25 MCG capsule Take 0.25 mcg by mouth daily.  11  . calcium acetate (PHOSLO) 667 MG capsule Take 2 capsules (1,334 mg total) by mouth 3 (three) times  daily with meals. 180 capsule 0  . cholecalciferol (VITAMIN D) 25 MCG (1000 UT) tablet Take 1,000 Units by mouth daily.    . fenofibrate 160 MG tablet Take 1 tablet (160 mg total) by mouth daily. 30 tablet 0  . furosemide (LASIX) 40 MG tablet Take 1 tablet (40 mg total) by mouth daily. 30 tablet 0  . gabapentin (NEURONTIN) 100 MG capsule Take 100 mg by mouth at bedtime. Take 1 capsule at bedtime and increase  to 3 capsules at bedtime    . labetalol (NORMODYNE) 200 MG tablet Take 2 tablets (400 mg total) by mouth 2 (two) times daily. 120 tablet 0  . rosuvastatin (CRESTOR) 10 MG tablet Take 10 mg by mouth daily.    . sertraline (ZOLOFT) 25 MG tablet Take 1 tablet (25 mg total) by mouth daily. 30 tablet 3  . sodium zirconium cyclosilicate (LOKELMA) 10 g PACK packet Take 10 g by mouth 2 (two) times daily. 60 packet 0  . SUMAtriptan (IMITREX) 50 MG tablet Take at the start of headache. May repeat in 2 hours if headache persists or recurs. 10 tablet 3  . tamsulosin (FLOMAX) 0.4 MG CAPS capsule Take 1 capsule by mouth at bedtime.  11  . Cetirizine HCl (ZYRTEC ALLERGY) 10 MG CAPS Take 1 capsule (10 mg total) by mouth daily. (Patient not taking: Reported on 09/12/2018) 30 capsule   . omega-3 acid ethyl esters (LOVAZA) 1 g capsule Take 2 capsules (2 g total) by mouth 2 (two) times daily. (Patient not taking: Reported on 08/25/2018) 360 capsule 1   No facility-administered medications prior to visit.     Allergies  Allergen Reactions  . Beef-Derived Products Other (See Comments)    Cultural preference  . Pegademase Bovine Other (See Comments)    Cultural preference  . Poractant Alfa Other (See Comments)    Cultural preference  . Pork-Derived Products Other (See Comments)    Cultural preference    ROS Review of Systems Review of Systems  Constitutional: Negative for activity change, appetite change, chills and fever.  HENT: Negative for congestion, nosebleeds, trouble swallowing and voice change.    Respiratory: Negative for cough, shortness of breath and wheezing.   Gastrointestinal: Negative for diarrhea, nausea and vomiting.  Genitourinary: Negative for difficulty urinating, dysuria, flank pain and hematuria.  Musculoskeletal: Negative for back pain, joint swelling and neck pain.  Neurological: Negative for dizziness, speech difficulty, light-headedness and numbness.  See HPI. All other review of systems negative.     Objective:    Physical Exam  BP 133/74 (BP Location: Right Arm, Patient Position: Sitting, Cuff Size: Large)   Pulse 85   Temp 98.7 F (37.1 C) (Oral)   Resp 17   Ht _0  (1.753 m)   Wt 265 lb 9.6 oz (120.5 kg)   SpO2 97%   BMI 39.22 kg/m  Wt Readings from Last 3 Encounters:  09/12/18 265 lb 9.6 oz (120.5 kg)  08/27/18 259 lb 14.8 oz (117.9 kg)  08/24/18 272 lb (123.4 kg)   Physical Exam  Constitutional: Oriented to person, place, and time. Appears well-developed and well-nourished.  HENT:  Head: Normocephalic and atraumatic.  Eyes: Conjunctivae and EOM are normal.  Cardiovascular: Normal rate, regular rhythm, normal heart sounds and intact distal pulses.  No murmur heard. Pulmonary/Chest: Effort normal and breath sounds normal. No stridor. No respiratory distress. Has no wheezes.  Abdomen: obese, nondistended, normoactive bs, soft, nontender Neurological: Is alert and oriented to person, place, and time.  Skin: Skin is warm. Capillary refill takes less than 2 seconds.  Psychiatric: Has a normal mood and affect. Behavior is normal. Judgment and thought content normal.    Health Maintenance Due  Topic Date Due  . COLONOSCOPY  09/16/2018  . INFLUENZA VACCINE  09/15/2018    There are no preventive care reminders to display for this patient.  Lab Results  Component Value Date   TSH 1.125 08/25/2018   Lab Results  Component Value Date  WBC 6.0 09/12/2018   HGB 9.9 (L) 09/12/2018   HCT 28.0 (L) 09/12/2018   MCV 81 09/12/2018   PLT 207  09/12/2018   Lab Results  Component Value Date   NA 143 09/12/2018   K 5.0 09/12/2018   CO2 20 09/12/2018   GLUCOSE 84 09/12/2018   BUN 63 (H) 09/12/2018   CREATININE 4.34 (HH) 09/12/2018   BILITOT <0.2 09/12/2018   ALKPHOS 50 09/12/2018   AST 14 09/12/2018   ALT 11 09/12/2018   PROT 6.1 09/12/2018   ALBUMIN 4.1 09/12/2018   CALCIUM 8.6 (L) 09/12/2018   ANIONGAP 11 08/27/2018   GFR 54.07 (L) 05/06/2014   Lab Results  Component Value Date   CHOL 313 (H) 08/24/2018   Lab Results  Component Value Date   HDL 24 (L) 08/24/2018   Lab Results  Component Value Date   LDLCALC Comment 08/24/2018   Lab Results  Component Value Date   TRIG 1,340 (HH) 08/24/2018   Lab Results  Component Value Date   CHOLHDL 13.0 (H) 08/24/2018   Lab Results  Component Value Date   HGBA1C 5.4 10/11/2017      Assessment & Plan:   Problem List Items Addressed This Visit      Other   Vitamin D deficiency  - vitamin D deficiency secondary to CKD    Other Visit Diagnoses    Chronic kidney disease (CKD), active medical management without dialysis, stage 4 (severe) ([place])    -  Primary Pt with one kidney who will follow up with Nephrology Continue to avoid NSAIDs   Relevant Orders   CMP14+EGFR (Completed)   CBC (Completed)   Hypertriglyceridemia    -  Discussed lipids and diet   Unilateral headache    -  Seeing Neurology, avoid NSAIDs. Continue gabapentin which patient takes for his back pain   Essential hypertension    - Patient's blood pressure IMPROVED and is at goal of 139/89 or less. Condition improved post hospitalization. Continue current medications and treatment plan and follow up with Nephrology I recommend that you exercise for 30-45 minutes 5 days a week. I also recommend a balanced diet with fruits and vegetables every day, lean meats, and little fried foods. The DASH diet (you can find this online) is a good example of this.       Meds ordered this encounter  Medications   . Vitamin D, Ergocalciferol, (DRISDOL) 1.25 MG (50000 UT) CAPS capsule    Sig: Take 1 capsule (50,000 Units total) by mouth every 7 (seven) days.    Dispense:  4 capsule    Refill:  6  . esomeprazole (NEXIUM) 40 MG capsule    Sig: Take 1 capsule (40 mg total) by mouth daily.    Dispense:  30 capsule    Refill:  3    A total of 45 minutes were spent face-to-face with the patient during this encounter and over half of that time was spent on counseling and coordination of care.  Spent time reviewing records from [place] and also hospitalizations and Echo and explained with patient the importance of follow up.   Follow-up: No follow-ups on file.    Forrest Moron, MD

## 2018-09-12 NOTE — Patient Instructions (Addendum)
  Bring all your medications in a bag to the next visit to ensure that they are all updated Follow up in 2 weeks for blood pressure follow up and to go over meds    If you have lab work done today you will be contacted with your lab results within the next 2 weeks.  If you have not heard from [place] then please contact us. The fastest way to get your results is to register for My Chart.   IF you received an x-ray today, you will receive an invoice from [facility]. Please contact [facility] at [phone] with questions or concerns regarding your invoice.   IF you received labwork today, you will receive an invoice from [facility]. Please contact [facility] at [phone] with questions or concerns regarding your invoice.   Our billing staff will not be able to assist you with questions regarding bills from these companies.  You will be contacted with the lab results as soon as they are available. The fastest way to get your results is to activate your My Chart account. Instructions are located on the last page of this paperwork. If you have not heard from [place] regarding the results in 2 weeks, please contact this office.

## 2018-09-13 LAB — CMP14+EGFR
ALT: 11 IU/L (ref 0–44)
AST: 14 IU/L (ref 0–40)
Albumin/Globulin Ratio: 2.1 (ref 1.2–2.2)
Albumin: 4.1 g/dL (ref 3.8–4.9)
Alkaline Phosphatase: 50 IU/L (ref 39–117)
BUN/Creatinine Ratio: 15 (ref 9–20)
BUN: 63 mg/dL — ABNORMAL HIGH (ref 6–24)
Bilirubin Total: <0.2 mg/dL (ref 0.0–1.2)
CO2: 20 mmol/L (ref 20–29)
Calcium: 8.6 mg/dL — ABNORMAL LOW (ref 8.7–10.2)
Chloride: 106 mmol/L (ref 96–106)
Creatinine, Ser: 4.34 mg/dL (ref 0.76–1.27)
GFR calc Af Amer: 17 mL/min/{1.73_m2} — ABNORMAL LOW (ref >59)
GFR calc non Af Amer: 15 mL/min/{1.73_m2} — ABNORMAL LOW (ref >59)
Globulin, Total: 2 g/dL (ref 1.5–4.5)
Glucose: 84 mg/dL (ref 65–99)
Potassium: 5 mmol/L (ref 3.5–5.2)
Sodium: 143 mmol/L (ref 134–144)
Total Protein: 6.1 g/dL (ref 6.0–8.5)

## 2018-09-13 LAB — CBC
Hematocrit: 28 % — ABNORMAL LOW (ref 37.5–51.0)
Hemoglobin: 9.9 g/dL — ABNORMAL LOW (ref 13.0–17.7)
MCH: 28.8 pg (ref 26.6–33.0)
MCHC: 35.4 g/dL (ref 31.5–35.7)
MCV: 81 fL (ref 79–97)
Platelets: 207 10*3/uL (ref 150–450)
RBC: 3.44 x10E6/uL — ABNORMAL LOW (ref 4.14–5.80)
RDW: 13 % (ref 11.6–15.4)
WBC: 6 10*3/uL (ref 3.4–10.8)

## 2018-09-17 ENCOUNTER — Encounter: Payer: Self-pay | Admitting: Internal Medicine

## 2018-09-17 DIAGNOSIS — G4733 Obstructive sleep apnea (adult) (pediatric): Secondary | ICD-10-CM | POA: Diagnosis not present

## 2018-09-17 NOTE — Progress Notes (Signed)
Faxed to [facility] per Dr. Nolon Rod  [phone] fax number

## 2018-09-17 NOTE — Progress Notes (Signed)
Mailed letter as suggested and faxed to [facility] as instructed. Called pt, vm full

## 2018-09-26 ENCOUNTER — Other Ambulatory Visit: Payer: Self-pay

## 2018-09-26 ENCOUNTER — Ambulatory Visit (INDEPENDENT_AMBULATORY_CARE_PROVIDER_SITE_OTHER): Payer: BLUE CROSS/BLUE SHIELD | Admitting: Family Medicine

## 2018-09-26 ENCOUNTER — Encounter: Payer: Self-pay | Admitting: Family Medicine

## 2018-09-26 VITALS — BP 128/72 | HR 79 | Temp 98.7°F | Resp 17 | Ht 69.0 in | Wt 266.0 lb

## 2018-09-26 DIAGNOSIS — Z1211 Encounter for screening for malignant neoplasm of colon: Secondary | ICD-10-CM | POA: Diagnosis not present

## 2018-09-26 DIAGNOSIS — Z01818 Encounter for other preprocedural examination: Secondary | ICD-10-CM

## 2018-09-26 DIAGNOSIS — I5032 Chronic diastolic (congestive) heart failure: Secondary | ICD-10-CM

## 2018-09-26 DIAGNOSIS — F5232 Male orgasmic disorder: Secondary | ICD-10-CM | POA: Diagnosis not present

## 2018-09-26 DIAGNOSIS — G8929 Other chronic pain: Secondary | ICD-10-CM

## 2018-09-26 DIAGNOSIS — R51 Headache: Secondary | ICD-10-CM

## 2018-09-26 DIAGNOSIS — F4323 Adjustment disorder with mixed anxiety and depressed mood: Secondary | ICD-10-CM

## 2018-09-26 DIAGNOSIS — R5382 Chronic fatigue, unspecified: Secondary | ICD-10-CM

## 2018-09-26 MED ORDER — FUROSEMIDE 40 MG PO TABS: 40.0000 mg | ORAL_TABLET | Freq: Every day | ORAL | 0 refills | Status: DC

## 2018-09-26 MED ORDER — CALCIUM ACETATE (PHOS BINDER) 667 MG PO CAPS: 1334.0000 mg | ORAL_CAPSULE | Freq: Three times a day (TID) | ORAL | 3 refills | Status: DC

## 2018-09-26 MED ORDER — FENOFIBRATE 160 MG PO TABS: 160.0000 mg | ORAL_TABLET | Freq: Every day | ORAL | 3 refills | Status: DC

## 2018-09-26 MED ORDER — ESCITALOPRAM OXALATE 20 MG PO TABS: 20.0000 mg | ORAL_TABLET | Freq: Every day | ORAL | 3 refills | Status: DC

## 2018-09-26 NOTE — Progress Notes (Signed)
Established Patient Office Visit  Subjective:  Patient ID: [name], male    DOB: Dec 28, 1965  Age: 53 y.o. MRN: [id number]  CC:  Chief Complaint  Patient presents with  . Chronic Kidney Disease    f/u  . Medication Refill    calcium acetate,fenofibrate, lasix, zoloft    HPI [name] presents for   Pt reports that he feels better since using CPAP for his OSA   He states that he fells like his mood is better , with better appetite and better sleep He would like to refill is zoloft He states that he feels like his mood is better  He reports that he feels weak after working a 12 hour shift he does not feel very good He states that he has been having difficulty to even orgasm He states that his wife has been complaining He reports that he has a disorder where he does not get to have an ejaculation with intercourse. States that he feels like there is a blockage  Wt Readings from Last 3 Encounters:  10/15/18 264 lb (119.7 kg)  09/26/18 266 lb (120.7 kg)  09/12/18 265 lb 9.6 oz (120.5 kg)    He reports lower extremity edema He is out of his furosemide  He reports some shortness of breath He states that he wakes up to take his meds    Past Medical History:  Diagnosis Date  . Anemia   . Back pain   . CKD (chronic kidney disease), stage III ([place])   . Colon polyps    adenomatous  . Elevated cholesterol   . History of echocardiogram    Echo 11/17: EF 65-70, normal wall motion, grade 1 diastolic dysfunction, trivial MR, mild LAE, mild TR  . Hyperlipidemia   . Hypertension    no current bp meds for last 3 months  . Kidney stones 2007  . Medication intolerance    a. multiple with prior nonadherence to regimen.  . Otosclerosis of both ears   . PUD (peptic ulcer disease)    Has had unspecified surgery for this  . Sleep apnea     Past Surgical History:  Procedure Laterality Date  . NEPHRECTOMY  02/18/2011   Procedure: NEPHRECTOMY;  Surgeon: Hanley Ben, MD;   Location: [facility];  Service: Urology;  Laterality: Right;  . SMALL INTESTINE SURGERY    . STAPEDOTOMY  2005   lt ear jan, right ear sept  . surgery for ulcers  1990    Family History  Problem Relation Age of Onset  . Hyperlipidemia Father   . Heart disease Mother   . Hypertension Mother   . Hypertension Sister   . Heart disease Brother 36       CABG  . Hyperlipidemia Sister   . Hyperlipidemia Sister   . Esophageal cancer Cousin   . Colon cancer Neg Hx   . Stomach cancer Neg Hx   . Rectal cancer Neg Hx     Social History   Socioeconomic History  . Marital status: Married    Spouse name: Edgardo Roys  . Number of children: 5  . Years of education: Not on file  . Highest education level: 12th grade  Occupational History    Employer: [employer]  Social Needs  . Financial resource strain: Not hard at all  . Food insecurity    Worry: Never true    Inability: Never true  . Transportation needs    Medical: No    Non-medical: No  Tobacco Use  . Smoking status: Never Smoker  . Smokeless tobacco: Never Used  Substance and Sexual Activity  . Alcohol use: No  . Drug use: No  . Sexual activity: Yes    Birth control/protection: None  Lifestyle  . Physical activity    Days per week: 0 days    Minutes per session: 0 min  . Stress: Not at all  Relationships  . Social connections    Talks on phone: More than three times a week    Gets together: More than three times a week    Attends religious service: More than 4 times per year    Active member of club or organization: No    Attends meetings of clubs or organizations: Never    Relationship status: Married  . Intimate partner violence    Fear of current or ex partner: No    Emotionally abused: No    Physically abused: No    Forced sexual activity: No  Other Topics Concern  . Not on file  Social History Narrative   Lives at home with wife and family. He is from [place]. Came to the [place] in 2002.      Patient is  right-handed. He lives in a one level home. He drinks 1-2 cups of coffee and tea a day. He does not exercise.    Outpatient Medications Prior to Visit  Medication Sig Dispense Refill  . acetaminophen (TYLENOL) 500 MG tablet Take 1 tablet (500 mg total) by mouth every 6 (six) hours as needed for moderate pain. 30 tablet 0  . calcitRIOL (ROCALTROL) 0.25 MCG capsule Take 0.25 mcg by mouth daily.  11  . cholecalciferol (VITAMIN D) 25 MCG (1000 UT) tablet Take 1,000 Units by mouth daily.    Marland Kitchen esomeprazole (NEXIUM) 40 MG capsule Take 1 capsule (40 mg total) by mouth daily. 30 capsule 3  . labetalol (NORMODYNE) 200 MG tablet Take 2 tablets (400 mg total) by mouth 2 (two) times daily. 120 tablet 0  . rosuvastatin (CRESTOR) 10 MG tablet Take 10 mg by mouth daily.    . Vitamin D, Ergocalciferol, (DRISDOL) 1.25 MG (50000 UT) CAPS capsule Take 1 capsule (50,000 Units total) by mouth every 7 (seven) days. 4 capsule 6  . amLODipine (NORVASC) 10 MG tablet Take 1 tablet (10 mg total) by mouth daily. (Patient not taking: Reported on 10/15/2018) 30 tablet 0  . calcium acetate (PHOSLO) 667 MG capsule Take 2 capsules (1,334 mg total) by mouth 3 (three) times daily with meals. 180 capsule 0  . fenofibrate 160 MG tablet Take 1 tablet (160 mg total) by mouth daily. 30 tablet 0  . furosemide (LASIX) 40 MG tablet Take 1 tablet (40 mg total) by mouth daily. 30 tablet 0  . gabapentin (NEURONTIN) 100 MG capsule Take 100 mg by mouth at bedtime. Take 1 capsule at bedtime and increase to 3 capsules at bedtime    . omega-3 acid ethyl esters (LOVAZA) 1 g capsule Take 2 capsules (2 g total) by mouth 2 (two) times daily. (Patient not taking: Reported on 10/15/2018) 360 capsule 1  . sertraline (ZOLOFT) 25 MG tablet Take 1 tablet (25 mg total) by mouth daily. 30 tablet 3  . sodium zirconium cyclosilicate (LOKELMA) 10 g PACK packet Take 10 g by mouth 2 (two) times daily. (Patient not taking: Reported on 10/15/2018) 60 packet 0  .  SUMAtriptan (IMITREX) 50 MG tablet Take at the start of headache. May repeat in 2 hours if  headache persists or recurs. (Patient not taking: Reported on 10/15/2018) 10 tablet 3  . tamsulosin (FLOMAX) 0.4 MG CAPS capsule Take 1 capsule by mouth at bedtime.  11  . Cetirizine HCl (ZYRTEC ALLERGY) 10 MG CAPS Take 1 capsule (10 mg total) by mouth daily. (Patient not taking: Reported on 09/12/2018) 30 capsule    No facility-administered medications prior to visit.     Allergies  Allergen Reactions  . Beef-Derived Products Other (See Comments)    Cultural preference  . Pegademase Bovine Other (See Comments)    Cultural preference  . Poractant Alfa Other (See Comments)    Cultural preference  . Pork-Derived Products Other (See Comments)    Cultural preference    ROS Review of Systems Review of Systems  Constitutional: Negative for activity change, appetite change, chills and fever.  HENT: Negative for congestion, nosebleeds, trouble swallowing and voice change.   Respiratory: Negative for cough, shortness of breath and wheezing.   Gastrointestinal: +bloating Genitourinary: Negative for difficulty urinating, dysuria, flank pain and hematuria.  Musculoskeletal: Negative for back pain, joint swelling and neck pain.  Neurological: Negative for dizziness, speech difficulty, light-headedness and numbness.  See HPI. All other review of systems negative.     Objective:    Physical Exam  BP 128/72 (BP Location: Left Arm, Patient Position: Sitting, Cuff Size: Large)   Pulse 79   Temp 98.7 F (37.1 C) (Oral)   Resp 17   Ht 5\' 9"  (1.753 m)   Wt 266 lb (120.7 kg)   SpO2 97%   BMI 39.28 kg/m  Wt Readings from Last 3 Encounters:  10/15/18 264 lb (119.7 kg)  09/26/18 266 lb (120.7 kg)  09/12/18 265 lb 9.6 oz (120.5 kg)   Physical Exam  Constitutional: Oriented to person, place, and time. Appears well-developed and well-nourished.  HENT:  Head: Normocephalic and atraumatic.  Eyes:  Conjunctivae and EOM are normal.  Cardiovascular: Normal rate, regular rhythm, normal heart sounds and intact distal pulses.  No murmur heard. Pulmonary/Chest: Effort normal and breath sounds normal. No stridor. No respiratory distress. Has no wheezes.  Abdomen: obese abdomen, normoactive bs, soft, nontender Neurological: Is alert and oriented to person, place, and time.  Skin: Skin is warm. Capillary refill takes less than 2 seconds.  Psychiatric: Has a normal mood and affect. Behavior is normal. Judgment and thought content normal.    Health Maintenance Due  Topic Date Due  . INFLUENZA VACCINE  09/15/2018  . COLONOSCOPY  09/16/2018    There are no preventive care reminders to display for this patient.  Lab Results  Component Value Date   TSH 1.125 08/25/2018   Lab Results  Component Value Date   WBC 6.0 09/12/2018   HGB 9.9 (L) 09/12/2018   HCT 28.0 (L) 09/12/2018   MCV 81 09/12/2018   PLT 207 09/12/2018   Lab Results  Component Value Date   NA 143 09/12/2018   K 5.0 09/12/2018   CO2 20 09/12/2018   GLUCOSE 84 09/12/2018   BUN 63 (H) 09/12/2018   CREATININE 4.34 (HH) 09/12/2018   BILITOT <0.2 09/12/2018   ALKPHOS 50 09/12/2018   AST 14 09/12/2018   ALT 11 09/12/2018   PROT 6.1 09/12/2018   ALBUMIN 4.1 09/12/2018   CALCIUM 8.6 (L) 09/12/2018   ANIONGAP 11 08/27/2018   GFR 54.07 (L) 05/06/2014   Lab Results  Component Value Date   CHOL 313 (H) 08/24/2018   Lab Results  Component Value Date   HDL  24 (L) 08/24/2018   Lab Results  Component Value Date   LDLCALC Comment 08/24/2018   Lab Results  Component Value Date   TRIG 1,340 (HH) 08/24/2018   Lab Results  Component Value Date   CHOLHDL 13.0 (H) 08/24/2018   Lab Results  Component Value Date   HGBA1C 5.4 10/11/2017      Assessment & Plan:   Problem List Items Addressed This Visit      Other   Chronic primary headache   Relevant Medications   escitalopram (LEXAPRO) 20 MG tablet    Other  Visit Diagnoses    Chronic diastolic CHF (congestive heart failure) ([place])    -  Primary Refilled his lasisx Could be contributing to fatigue   Relevant Medications   furosemide (LASIX) 40 MG tablet   Screening for colon cancer    -  Discussed routine screening   Relevant Orders   Ambulatory referral to Gastroenterology   Novel Coronavirus, NAA ([facility])   Preop testing       Relevant Orders   Novel Coronavirus, NAA ([facility])   Anorgasmia of male    -  A known side effect of SSRIs   Adjustment disorder with mixed anxiety and depressed mood    -  Adjusted medication from zoloft to celexa   Chronic fatigue      Related to mood and multiple comorbidities      Meds ordered this encounter  Medications  . escitalopram (LEXAPRO) 20 MG tablet    Sig: Take 1 tablet (20 mg total) by mouth daily.    Dispense:  90 tablet    Refill:  3  . furosemide (LASIX) 40 MG tablet    Sig: Take 1 tablet (40 mg total) by mouth daily.    Dispense:  90 tablet    Refill:  0  . DISCONTD: fenofibrate 160 MG tablet    Sig: Take 1 tablet (160 mg total) by mouth daily.    Dispense:  90 tablet    Refill:  3  . calcium acetate (PHOSLO) 667 MG capsule    Sig: Take 2 capsules (1,334 mg total) by mouth 3 (three) times daily with meals.    Dispense:  180 capsule    Refill:  3    Follow-up: Return in about 2 months (around 11/26/2018) for heart failure.    Forrest Moron, MD

## 2018-09-26 NOTE — Patient Instructions (Addendum)
1 week to recheck swelling, weight and signs of heart failure.  Please take your furosemide as soon as you get home    If you have lab work done today you will be contacted with your lab results within the next 2 weeks.  If you have not heard from [place] then please contact us. The fastest way to get your results is to register for My Chart.   IF you received an x-ray today, you will receive an invoice from [facility]. Please contact [facility] at [phone] with questions or concerns regarding your invoice.   IF you received labwork today, you will receive an invoice from [facility]. Please contact [facility] at [phone] with questions or concerns regarding your invoice.   Our billing staff will not be able to assist you with questions regarding bills from these companies.  You will be contacted with the lab results as soon as they are available. The fastest way to get your results is to activate your My Chart account. Instructions are located on the last page of this paperwork. If you have not heard from [place] regarding the results in 2 weeks, please contact this office.     Heart Failure Action Plan A heart failure action plan helps you understand what to do when you have symptoms of heart failure. Follow the plan that was created by you and your health care provider. Review your plan each time you visit your health care provider. Red zone These signs and symptoms mean you should get medical help right away:  You have trouble breathing when resting.  You have a dry cough that is getting worse.  You have swelling or pain in your legs or abdomen that is getting worse.  You suddenly gain more than 2-3 lb (0.9-1.4 kg) in a day, or more than 5 lb (2.3 kg) in one week. This amount may be more or less depending on your condition.  You have trouble staying awake or you feel confused.  You have chest pain.  You do not have an appetite.  You pass out. If you experience  any of these symptoms:  Call your local emergency services (911 in the [place] right away or seek help at the emergency department of the nearest hospital. Yellow zone These signs and symptoms mean your condition may be getting worse and you should make some changes:  You have trouble breathing when you are active or you need to sleep with extra pillows.  You have swelling in your legs or abdomen.  You gain 2-3 lb (0.9-1.4 kg) in one day, or 5 lb (2.3 kg) in one week. This amount may be more or less depending on your condition.  You get tired easily.  You have trouble sleeping.  You have a dry cough. If you experience any of these symptoms:  Contact your health care provider within the next day.  Your health care provider may adjust your medicines. Green zone These signs mean you are doing well and can continue what you are doing:  You do not have shortness of breath.  You have very little swelling or no new swelling.  Your weight is stable (no gain or loss).  You have a normal activity level.  You do not have chest pain or any other new symptoms. Follow these instructions at home:  Take over-the-counter and prescription medicines only as told by your health care provider.  Weigh yourself daily. Your target weight is __________ lb (__________ kg). ? Call your health  care provider if you gain more than __________ lb (__________ kg) in a day, or more than __________ lb (__________ kg) in one week.  Eat a heart-healthy diet. Work with a diet and nutrition specialist (dietitian) to create an eating plan that is best for you.  Keep all follow-up visits as told by your health care provider. This is important. Where to find more information  American Heart Association: www.heart.org Summary  Follow the action plan that was created by you and your health care provider.  Get help right away if you have any symptoms in the Red zone. This information is not intended to replace  advice given to you by your health care provider. Make sure you discuss any questions you have with your health care provider. Document Released: 03/12/2016 Document Revised: 01/13/2017 Document Reviewed: 03/12/2016 Elsevier Patient Education  2020 [employer].

## 2018-10-10 ENCOUNTER — Telehealth: Payer: Self-pay | Admitting: Family Medicine

## 2018-10-10 NOTE — Telephone Encounter (Signed)
Pt wanted to let Dr.Stallings know that his leg is still swollen and he has body shivers .   Would like someone in clinical to call him at [phone]

## 2018-10-12 NOTE — Telephone Encounter (Signed)
LVM to call office back about concerns.

## 2018-10-15 ENCOUNTER — Other Ambulatory Visit: Payer: Self-pay

## 2018-10-15 ENCOUNTER — Telehealth: Payer: Self-pay | Admitting: Physician Assistant

## 2018-10-15 ENCOUNTER — Telehealth: Payer: Self-pay | Admitting: Internal Medicine

## 2018-10-15 ENCOUNTER — Encounter: Payer: Self-pay | Admitting: Internal Medicine

## 2018-10-15 ENCOUNTER — Ambulatory Visit (INDEPENDENT_AMBULATORY_CARE_PROVIDER_SITE_OTHER): Payer: BLUE CROSS/BLUE SHIELD | Admitting: Internal Medicine

## 2018-10-15 VITALS — BP 150/72 | HR 76 | Temp 98.4°F | Ht 69.0 in | Wt 264.0 lb

## 2018-10-15 DIAGNOSIS — E785 Hyperlipidemia, unspecified: Secondary | ICD-10-CM

## 2018-10-15 DIAGNOSIS — N184 Chronic kidney disease, stage 4 (severe): Secondary | ICD-10-CM

## 2018-10-15 DIAGNOSIS — G894 Chronic pain syndrome: Secondary | ICD-10-CM | POA: Diagnosis not present

## 2018-10-15 DIAGNOSIS — M5136 Other intervertebral disc degeneration, lumbar region: Secondary | ICD-10-CM | POA: Diagnosis not present

## 2018-10-15 DIAGNOSIS — E781 Pure hyperglyceridemia: Secondary | ICD-10-CM

## 2018-10-15 MED ORDER — ICOSAPENT ETHYL 1 G PO CAPS: 2.0000 | ORAL_CAPSULE | Freq: Two times a day (BID) | ORAL | 11 refills | Status: DC

## 2018-10-15 NOTE — Progress Notes (Addendum)
LIPID CLINIC CONSULT NOTE  Chief Complaint:  Leg swelling  Primary Care Physician: Forrest Moron, MD  Primary Cardiologist:  Ena Dawley, MD  HPI:  [name] is a 53 y.o. male who is being seen today for the evaluation of dyslipidemia at the request of Dunn, Dayna N, PA-C.  This is a pleasant 53 year old Arabic male who was seen today with a Psychologist, sport and exercise.  His past medical history significant for chronic kidney disease which is stage IV and currently a GFR of around 10.  He has been followed by [name] with nephrology and has an appointment in October to see Dr. Carlis Abbott with vascular surgery for creation of a fistula.  He has had longstanding dyslipidemia and multiple medication intolerances.  He was previously followed in the [facility] lipid clinic by [name], PharmD.  When he was last seen in November 2019, he was on rosuvastatin 10 mg daily and Lovaza 2 g twice daily.  He was reportedly intolerance to atorvastatin 80 mg causing myalgias and fenofibrate 160 mg causing myalgias.  I reviewed the medicine list with him today and looked at all the medications that were in his possession.  After working with the translator we determined that he was still taking the rosuvastatin 10 mg daily and also fenofibrate 160 mg daily.  He did not have any of the Lovaza.  Previously he was also evaluated apparently for Vascepa but felt to be unable to obtain that due to financial constraints.  He is working and does have [employer] but is in application for disability.  There is a strong family history of coronary disease in multiple family members.  His only other complaint today is some shortness of breath and next edema.  Is difficult to know whether shortness of breath may be related to advanced chronic kidney disease or coronary disease.  Given his advanced creatinine, it is unlikely that he could have any dye related cardiovascular work-up.  His last stress test was in 2015.  His  most recent lipids from August 24, 2018 showed total cholesterol 313, triglycerides 1340, HDL 24 and direct LDL of 63.  PMHx:  Past Medical History:  Diagnosis Date   Anemia    Back pain    CKD (chronic kidney disease), stage III ([facility])    Colon polyps    adenomatous   Elevated cholesterol    History of echocardiogram    Echo 11/17: EF 65-70, normal wall motion, grade 1 diastolic dysfunction, trivial MR, mild LAE, mild TR   Hyperlipidemia    Hypertension    no current bp meds for last 3 months   Kidney stones 2007   Medication intolerance    a. multiple with prior nonadherence to regimen.   Otosclerosis of both ears    PUD (peptic ulcer disease)    Has had unspecified surgery for this   Sleep apnea     Past Surgical History:  Procedure Laterality Date   NEPHRECTOMY  02/18/2011   Procedure: NEPHRECTOMY;  Surgeon: Hanley Ben, MD;  Location: [facility];  Service: Urology;  Laterality: Right;   SMALL INTESTINE SURGERY     STAPEDOTOMY  2005   lt ear jan, right ear sept   surgery for ulcers  1990    FAMHx:  Family History  Problem Relation Age of Onset   Hyperlipidemia Father    Heart disease Mother    Hypertension Mother    Hypertension Sister    Heart disease Brother 24  CABG   Hyperlipidemia Sister    Hyperlipidemia Sister    Esophageal cancer Cousin    Colon cancer Neg Hx    Stomach cancer Neg Hx    Rectal cancer Neg Hx     SOCHx:   reports that he has never smoked. He has never used smokeless tobacco. He reports that he does not drink alcohol or use drugs.  ALLERGIES:  Allergies  Allergen Reactions   Beef-Derived Products Other (See Comments)    Cultural preference   Pegademase Bovine Other (See Comments)    Cultural preference   Poractant Alfa Other (See Comments)    Cultural preference   Pork-Derived Products Other (See Comments)    Cultural preference    ROS: Pertinent items noted in HPI and remainder of  comprehensive ROS otherwise negative.  HOME MEDS: Current Outpatient Medications on File Prior to Visit  Medication Sig Dispense Refill   acetaminophen (TYLENOL) 500 MG tablet Take 1 tablet (500 mg total) by mouth every 6 (six) hours as needed for moderate pain. 30 tablet 0   calcitRIOL (ROCALTROL) 0.25 MCG capsule Take 0.25 mcg by mouth daily.  11   calcium acetate (PHOSLO) 667 MG capsule Take 2 capsules (1,334 mg total) by mouth 3 (three) times daily with meals. 180 capsule 3   cholecalciferol (VITAMIN D) 25 MCG (1000 UT) tablet Take 1,000 Units by mouth daily.     escitalopram (LEXAPRO) 20 MG tablet Take 1 tablet (20 mg total) by mouth daily. 90 tablet 3   esomeprazole (NEXIUM) 40 MG capsule Take 1 capsule (40 mg total) by mouth daily. 30 capsule 3   furosemide (LASIX) 40 MG tablet Take 1 tablet (40 mg total) by mouth daily. 90 tablet 0   labetalol (NORMODYNE) 200 MG tablet Take 2 tablets (400 mg total) by mouth 2 (two) times daily. 120 tablet 0   rosuvastatin (CRESTOR) 10 MG tablet Take 10 mg by mouth daily.     Vitamin D, Ergocalciferol, (DRISDOL) 1.25 MG (50000 UT) CAPS capsule Take 1 capsule (50,000 Units total) by mouth every 7 (seven) days. 4 capsule 6   No current facility-administered medications on file prior to visit.     LABS/IMAGING: No results found for this or any previous visit (from the past 48 hour(s)). No results found.  LIPID PANEL:    Component Value Date/Time   CHOL 313 (H) 08/24/2018 1458   TRIG 1,340 (HH) 08/24/2018 1458   HDL 24 (L) 08/24/2018 1458   CHOLHDL 13.0 (H) 08/24/2018 1458   CHOLHDL 5.3 (H) 12/09/2015 1328   VLDL NOT CALC 12/09/2015 1328   LDLCALC Comment 08/24/2018 1458   LDLDIRECT 63 08/24/2018 1458   LDLDIRECT 139.0 05/06/2014 1209    WEIGHTS: Wt Readings from Last 3 Encounters:  10/15/18 264 lb (119.7 kg)  09/26/18 266 lb (120.7 kg)  09/12/18 265 lb 9.6 oz (120.5 kg)    VITALS: BP (!) 150/72 (BP Location: Left Arm,  Patient Position: Sitting, Cuff Size: Normal)    Pulse 76    Temp 98.4 F (36.9 C)    Ht 5\' 9"  (1.753 m)    Wt 264 lb (119.7 kg)    BMI 38.99 kg/m   EXAM: General appearance: alert, no distress and morbidly obese Neck: no carotid bruit, no JVD and thyroid not enlarged, symmetric, no tenderness/mass/nodules Lungs: diminished breath sounds bibasilar Heart: regular rate and rhythm Abdomen: soft, non-tender; bowel sounds normal; no masses,  no organomegaly Extremities: edema 2+ edema Pulses: 2+ and symmetric Skin:  Skin color, texture, turgor normal. No rashes or lesions Neurologic: Grossly normal Psych: Pleasant  EKG: Deferred  ASSESSMENT: 1. Primary hypertriglyceridemia secondary to advanced kidney disease 2. Progressive dyspnea on exertion, ? coronary equivalent  PLAN: 1.   Mr. Huffaker has a primary hypertriglyceridemia which is related to advanced chronic kidney disease.  Most likely has some nephritic to nephrotic range proteinuria which is associated with loss of small lipoprotein such as HDL and LDL and concomitant increases in triglyceride rich chylomicrons of VLDL.  This more than likely explains his elevated triglycerides.  His LDL actually is fairly well treated on moderate potency rosuvastatin.  He cannot take fenofibrate given the fact that he is near end-stage renal disease due to increased risk of renal toxicity.  I recommend discontinuing that.  Vascepa is an option for him and has some data however we have very little data for treating dyslipidemia is in patients who are end-stage renal disease on dialysis.  At this point, if he could qualify for Vascepa recommend 2 g twice daily.  We also talked about dietary changes however it is unlikely to affect this significantly because he has primarily driven by his kidney disease.  Short of a renal transplant, it is unlikely that this will significantly improve.  Finally, because he has had some progressive dyspnea on exertion, he may need  repeat ischemia evaluation.  It may be challenging because of his advanced kidney disease at this point but it certainly is concerning for possible ischemia.  Thanks for the referral.  Pixie Casino, MD, FACC, [place] Director of the [facility] Lipid Disorders &  Cardiovascular Risk Reduction Clinic Diplomate of the American Board of Clinical Lipidology Attending Cardiologist  Direct Dial: [phone]   Fax: [phone]  Website:  [email or web address] [email or web address] 10/15/2018, 8:06 PM

## 2018-10-15 NOTE — Patient Instructions (Signed)
Medication Instructions:  STOP fenofibrate START vascepa 2 gram twice daily = 2 capsules in AM, 2 capsules in PM  If you need a refill on your cardiac medications before your next appointment, please call your pharmacy.   Lab work: FASTING lab work in 6 months - prior to next visit with Dr. Debara Pickett -- nothing to eat/drink after midnight If you have labs (blood work) drawn today and your tests are completely normal, you will receive your results only by: Marland Kitchen MyChart Message (if you have MyChart) OR . A paper copy in the mail If you have any lab test that is abnormal or we need to change your treatment, we will call you to review the results.  Testing/Procedures: NONE  Follow-Up: Dr. Debara Pickett recommends that you schedule a follow up visit with him the in the [place] in 6 months. Please have fasting blood work about 1 week prior to this visit and he will review the blood work results with you at your appointment.

## 2018-10-15 NOTE — Telephone Encounter (Signed)
Prior authorization for [name] has been approved.  For patients covered by Medicare Part D, you may submit a tier exception form to the plan or PBM. If approved by the plan or PBM, it may reduce the patient's out-of-pocket costs. Type "tier exception" in the "plan or PBM" field on the Form Pick page to view eligible tier exception forms.  Message from plan: Effective from 10/15/2018 through 10/14/2019.

## 2018-10-15 NOTE — Telephone Encounter (Signed)
Patient seen by Dr. Debara Pickett today for HLD consult, patient noted worsening DOE. Unclear if related to advancing kidney disease, BP, or ischemia given cardiac risk factors. He was previously extremely difficult historian even with translator so would need office visit to discuss further testing - cannot just put in order for [place] (need to discuss consent anyway). Please offer patient f/u OV with Dr. Rudi Coco team. Melina Copa PA-C

## 2018-10-16 ENCOUNTER — Encounter: Payer: Self-pay | Admitting: Family Medicine

## 2018-10-16 ENCOUNTER — Ambulatory Visit (INDEPENDENT_AMBULATORY_CARE_PROVIDER_SITE_OTHER): Payer: BLUE CROSS/BLUE SHIELD

## 2018-10-16 ENCOUNTER — Ambulatory Visit (INDEPENDENT_AMBULATORY_CARE_PROVIDER_SITE_OTHER): Payer: BLUE CROSS/BLUE SHIELD | Admitting: Family Medicine

## 2018-10-16 ENCOUNTER — Other Ambulatory Visit: Payer: Self-pay

## 2018-10-16 VITALS — BP 140/80 | HR 94 | Temp 98.6°F | Ht 69.0 in | Wt 263.0 lb

## 2018-10-16 DIAGNOSIS — E781 Pure hyperglyceridemia: Secondary | ICD-10-CM | POA: Diagnosis not present

## 2018-10-16 DIAGNOSIS — N184 Chronic kidney disease, stage 4 (severe): Secondary | ICD-10-CM

## 2018-10-16 DIAGNOSIS — R0600 Dyspnea, unspecified: Secondary | ICD-10-CM

## 2018-10-16 DIAGNOSIS — R0609 Other forms of dyspnea: Secondary | ICD-10-CM

## 2018-10-16 DIAGNOSIS — R079 Chest pain, unspecified: Secondary | ICD-10-CM | POA: Diagnosis not present

## 2018-10-16 DIAGNOSIS — I10 Essential (primary) hypertension: Secondary | ICD-10-CM

## 2018-10-16 DIAGNOSIS — R5382 Chronic fatigue, unspecified: Secondary | ICD-10-CM | POA: Diagnosis not present

## 2018-10-16 DIAGNOSIS — R0602 Shortness of breath: Secondary | ICD-10-CM | POA: Diagnosis not present

## 2018-10-16 LAB — GLUCOSE, POCT (MANUAL RESULT ENTRY): POC Glucose: 100 mg/dl — AB (ref 70–99)

## 2018-10-16 MED ORDER — ESCITALOPRAM OXALATE 10 MG PO TABS: 10.0000 mg | ORAL_TABLET | Freq: Every day | ORAL | 2 refills | Status: DC

## 2018-10-16 NOTE — Telephone Encounter (Signed)
Pt being evaluated today by provider

## 2018-10-16 NOTE — Patient Instructions (Signed)
° ° ° °  If you have lab work done today you will be contacted with your lab results within the next 2 weeks.  If you have not heard from us then please contact us. The fastest way to get your results is to register for My Chart. ° ° °IF you received an x-ray today, you will receive an invoice from [facility]. Please contact [facility] at [phone] with questions or concerns regarding your invoice.  ° °IF you received labwork today, you will receive an invoice from [facility]. Please contact [facility] at [phone] with questions or concerns regarding your invoice.  ° °Our billing staff will not be able to assist you with questions regarding bills from these companies. ° °You will be contacted with the lab results as soon as they are available. The fastest way to get your results is to activate your My Chart account. Instructions are located on the last page of this paperwork. If you have not heard from us regarding the results in 2 weeks, please contact this office. °  ° ° ° °

## 2018-10-16 NOTE — Telephone Encounter (Signed)
Call placed to pt via [employer], [place], ID# [id number], no answer.  Will try at a later time.

## 2018-10-16 NOTE — Progress Notes (Signed)
9/1/202010:05 AM  Johnnette Litter 1966-01-13, 53 y.o., male [id number]  No chief complaint on file.   HPI:   Patient is a 53 y.o. male with past medical history significant for CKD4, single kidney, OSA on cpap, dCHF who presents today for 4 days of tremors, DOE, chest discomfort and worsening edema  He stopped taking his medications as was not sure if symptoms were caused by any of these He denies any fever, chills, cough, abd pain, nausea, vomiting, diarrhea, constipation He does report 2 incidents of scant bright red blood per rectum, on toilet paper Recently hosp in July for hyperkalemia among above diagnoses  Renal Dr [name], sees on the 18th of this month  Lab Results  Component Value Date   CREATININE 4.34 ([place]) 09/12/2018   CREATININE 4.15 (H) 08/27/2018   CREATININE 4.10 (H) 08/26/2018  last GFR 15  CBC Latest Ref Rng & Units 09/12/2018 08/27/2018 08/26/2018  WBC 3.4 - 10.8 x10E3/uL 6.0 6.1 6.5  Hemoglobin 13.0 - 17.7 g/dL 9.9(L) 10.7(L) 10.8(L)  Hematocrit 37.5 - 51.0 % 28.0(L) 33.6(L) 33.3(L)  Platelets 150 - 450 x10E3/uL 207 170 173   Lab Results  Component Value Date   IRON 61 08/27/2018   TIBC 276 08/27/2018   FERRITIN 155 08/27/2018    Depression screen PHQ 2/9 09/26/2018 09/12/2018 01/22/2018  Decreased Interest 0 0 0  Down, Depressed, Hopeless 0 0 0  PHQ - 2 Score 0 0 0    Fall Risk  09/26/2018 09/12/2018 01/22/2018 01/03/2018 12/01/2017  Falls in the past year? 0 0 0 0 No  Number falls in past yr: 0 0 - - -  Injury with Fall? 0 0 - - -  Follow up Falls evaluation completed Falls evaluation completed - - -     Allergies  Allergen Reactions  . Beef-Derived Products Other (See Comments)    Cultural preference  . Pegademase Bovine Other (See Comments)    Cultural preference  . Poractant Alfa Other (See Comments)    Cultural preference  . Pork-Derived Products Other (See Comments)    Cultural preference    Prior to Admission medications   Medication Sig  Start Date End Date Taking? Authorizing Provider  acetaminophen (TYLENOL) 500 MG tablet Take 1 tablet (500 mg total) by mouth every 6 (six) hours as needed for moderate pain. 02/01/17   Forrest Moron, MD  calcitRIOL (ROCALTROL) 0.25 MCG capsule Take 0.25 mcg by mouth daily. 09/22/17   [provider]  calcium acetate (PHOSLO) 667 MG capsule Take 2 capsules (1,334 mg total) by mouth 3 (three) times daily with meals. 09/26/18   Forrest Moron, MD  cholecalciferol (VITAMIN D) 25 MCG (1000 UT) tablet Take 1,000 Units by mouth daily.    [provider]  escitalopram (LEXAPRO) 20 MG tablet Take 1 tablet (20 mg total) by mouth daily. 09/26/18   Forrest Moron, MD  esomeprazole (NEXIUM) 40 MG capsule Take 1 capsule (40 mg total) by mouth daily. 09/12/18   Forrest Moron, MD  furosemide (LASIX) 40 MG tablet Take 1 tablet (40 mg total) by mouth daily. 09/26/18   Forrest Moron, MD  Icosapent Ethyl 1 g CAPS Take 2 capsules (2 g total) by mouth 2 (two) times daily. 10/15/18   Hilty, Nadean Corwin, MD  labetalol (NORMODYNE) 200 MG tablet Take 2 tablets (400 mg total) by mouth 2 (two) times daily. 08/27/18   Eugenie Filler, MD  rosuvastatin (CRESTOR) 10 MG tablet Take 10 mg  by mouth daily.    [provider]  Vitamin D, Ergocalciferol, (DRISDOL) 1.25 MG (50000 UT) CAPS capsule Take 1 capsule (50,000 Units total) by mouth every 7 (seven) days. 09/12/18   Forrest Moron, MD    Past Medical History:  Diagnosis Date  . Anemia   . Back pain   . CKD (chronic kidney disease), stage III ([place])   . Colon polyps    adenomatous  . Elevated cholesterol   . History of echocardiogram    Echo 11/17: EF 65-70, normal wall motion, grade 1 diastolic dysfunction, trivial MR, mild LAE, mild TR  . Hyperlipidemia   . Hypertension    no current bp meds for last 3 months  . Kidney stones 2007  . Medication intolerance    a. multiple with prior nonadherence to regimen.  . Otosclerosis of both  ears   . PUD (peptic ulcer disease)    Has had unspecified surgery for this  . Sleep apnea     Past Surgical History:  Procedure Laterality Date  . NEPHRECTOMY  02/18/2011   Procedure: NEPHRECTOMY;  Surgeon: Hanley Ben, MD;  Location: [facility];  Service: Urology;  Laterality: Right;  . SMALL INTESTINE SURGERY    . STAPEDOTOMY  2005   lt ear jan, right ear sept  . surgery for ulcers  1990    Social History   Tobacco Use  . Smoking status: Never Smoker  . Smokeless tobacco: Never Used  Substance Use Topics  . Alcohol use: No    Family History  Problem Relation Age of Onset  . Hyperlipidemia Father   . Heart disease Mother   . Hypertension Mother   . Hypertension Sister   . Heart disease Brother 37       CABG  . Hyperlipidemia Sister   . Hyperlipidemia Sister   . Esophageal cancer Cousin   . Colon cancer Neg Hx   . Stomach cancer Neg Hx   . Rectal cancer Neg Hx     ROS Per hpi  OBJECTIVE:  Today's Vitals   10/16/18 1014  BP: 140/80  Pulse: 94  Temp: 98.6 F (37 C)  SpO2: 98%  Weight: 263 lb (119.3 kg)  Height: 5' 9"  (1.753 m)   Body mass index is 38.84 kg/m.  Wt Readings from Last 3 Encounters:  10/16/18 263 lb (119.3 kg)  10/15/18 264 lb (119.7 kg)  09/26/18 266 lb (120.7 kg)    Physical Exam Vitals signs and nursing note reviewed.  Constitutional:      Appearance: He is well-developed.  HENT:     Head: Normocephalic and atraumatic.  Eyes:     Conjunctiva/sclera: Conjunctivae normal.     Pupils: Pupils are equal, round, and reactive to light.  Neck:     Musculoskeletal: Neck supple.  Cardiovascular:     Rate and Rhythm: Normal rate and regular rhythm.     Heart sounds: Normal heart sounds. No murmur. No friction rub. No gallop.   Pulmonary:     Effort: Pulmonary effort is normal.     Breath sounds: Normal breath sounds. No wheezing or rales.  Abdominal:     General: Bowel sounds are normal.     Palpations: Abdomen is soft. There is no  hepatomegaly or splenomegaly.  Musculoskeletal:     Right lower leg: Edema (pitting +1 to knees) present.     Left lower leg: Edema present.  Skin:    General: Skin is warm and dry.  Neurological:  Mental Status: He is alert and oriented to person, place, and time.     Cranial Nerves: No cranial nerve deficit.     Motor: Tremor (fine, UE, worse with activity) present. No weakness or abnormal muscle tone.     My interpretation of EKG:  NSR, HR 86, no st changes  Results for orders placed or performed in visit on 10/16/18 (from the past 24 hour(s))  POCT glucose (manual entry)     Status: Abnormal   Collection Time: 10/16/18 10:26 AM  Result Value Ref Range   POC Glucose 100 (A) 70 - 99 mg/dl    Dg Chest 2 View  Result Date: 10/16/2018 CLINICAL DATA:  Shortness of breath EXAM: CHEST - 2 VIEW COMPARISON:  08/25/2018 FINDINGS: Cardiac shadow is mildly enlarged. No focal infiltrate or sizable effusion is seen. Mild persistent scarring is noted in the lingula. No sizable effusion is noted. No bony abnormality is seen. IMPRESSION: Stable lingular density likely related to scarring. No new focal abnormality is seen. Electronically Signed   By: Inez Catalina M.D.   On: 10/16/2018 10:43     ASSESSMENT and PLAN  1. Tremors Unclear etiology, could it be from lexapro in setting GFR < 20. Decreasing lexapro to 70m. Labs pending. ER precautions given. 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 DOE (dyspnea on exertion) Normal CXR and EKG, VSS, weight down. ACD. Resume lasix, has upcoming appt with renal. ER precautions given.  - EKG 12-Lead - DG Chest 2 View; Future  3. Chronic kidney disease (CKD), active medical management without dialysis, stage 4 (severe) ([place] Managed by renal. Has upcoming appt - DG Chest 2 View; Future  4. Chronic fatigue - CBC  5. Hypertriglyceridemia Managed by cards. Recently changed from fenofibrate to fish oil due to  ckd.  - CMP14+EGFR - POCT glucose (manual entry)  6. Essential hypertension Controlled. Continue current regime.  - CMP14+EGFR  Other orders - escitalopram (LEXAPRO) 10 MG tablet; Take 1 tablet (10 mg total) by mouth at bedtime.  Return in about 1 week (around 10/23/2018).    IRutherford Guys MD [facility] [place] [address]Ph.  [phone] [phone]

## 2018-10-17 ENCOUNTER — Telehealth: Payer: Self-pay

## 2018-10-17 LAB — CMP14+EGFR
ALT: 15 IU/L (ref 0–44)
AST: 18 IU/L (ref 0–40)
Albumin/Globulin Ratio: 2 (ref 1.2–2.2)
Albumin: 4.4 g/dL (ref 3.8–4.9)
Alkaline Phosphatase: 40 IU/L (ref 39–117)
BUN/Creatinine Ratio: 13 (ref 9–20)
BUN: 69 mg/dL — ABNORMAL HIGH (ref 6–24)
Bilirubin Total: 0.3 mg/dL (ref 0.0–1.2)
CO2: 18 mmol/L — ABNORMAL LOW (ref 20–29)
Calcium: 8.4 mg/dL — ABNORMAL LOW (ref 8.7–10.2)
Chloride: 109 mmol/L — ABNORMAL HIGH (ref 96–106)
Creatinine, Ser: 5.43 mg/dL (ref 0.76–1.27)
GFR calc Af Amer: 13 mL/min/{1.73_m2} — ABNORMAL LOW (ref >59)
GFR calc non Af Amer: 11 mL/min/{1.73_m2} — ABNORMAL LOW (ref >59)
Globulin, Total: 2.2 g/dL (ref 1.5–4.5)
Glucose: 110 mg/dL — ABNORMAL HIGH (ref 65–99)
Potassium: 5.2 mmol/L (ref 3.5–5.2)
Sodium: 142 mmol/L (ref 134–144)
Total Protein: 6.6 g/dL (ref 6.0–8.5)

## 2018-10-17 LAB — CBC
Hematocrit: 29.6 % — ABNORMAL LOW (ref 37.5–51.0)
Hemoglobin: 9.5 g/dL — ABNORMAL LOW (ref 13.0–17.7)
MCH: 27.3 pg (ref 26.6–33.0)
MCHC: 32.1 g/dL (ref 31.5–35.7)
MCV: 85 fL (ref 79–97)
Platelets: 178 10*3/uL (ref 150–450)
RBC: 3.48 x10E6/uL — ABNORMAL LOW (ref 4.14–5.80)
RDW: 13.1 % (ref 11.6–15.4)
WBC: 4.2 10*3/uL (ref 3.4–10.8)

## 2018-10-17 NOTE — Telephone Encounter (Signed)
Labs reviewed, stable, known ESRD

## 2018-10-17 NOTE — Telephone Encounter (Signed)
Received fax from [facility] re critical Cr.  Please review labs.  Text sent as well.

## 2018-10-18 DIAGNOSIS — G4733 Obstructive sleep apnea (adult) (pediatric): Secondary | ICD-10-CM | POA: Diagnosis not present

## 2018-10-23 ENCOUNTER — Telehealth: Payer: Self-pay

## 2018-10-23 ENCOUNTER — Ambulatory Visit (INDEPENDENT_AMBULATORY_CARE_PROVIDER_SITE_OTHER): Payer: BLUE CROSS/BLUE SHIELD | Admitting: Family Medicine

## 2018-10-23 ENCOUNTER — Encounter: Payer: BLUE CROSS/BLUE SHIELD | Admitting: Vascular Surgery

## 2018-10-23 ENCOUNTER — Encounter: Payer: Self-pay | Admitting: Family Medicine

## 2018-10-23 ENCOUNTER — Other Ambulatory Visit (HOSPITAL_COMMUNITY): Payer: BLUE CROSS/BLUE SHIELD

## 2018-10-23 ENCOUNTER — Other Ambulatory Visit: Payer: Self-pay

## 2018-10-23 ENCOUNTER — Encounter (HOSPITAL_COMMUNITY): Payer: BLUE CROSS/BLUE SHIELD

## 2018-10-23 VITALS — BP 168/81 | HR 72 | Temp 98.6°F | Ht 69.0 in | Wt 264.0 lb

## 2018-10-23 DIAGNOSIS — R079 Chest pain, unspecified: Secondary | ICD-10-CM

## 2018-10-23 DIAGNOSIS — I10 Essential (primary) hypertension: Secondary | ICD-10-CM | POA: Diagnosis not present

## 2018-10-23 DIAGNOSIS — R51 Headache: Secondary | ICD-10-CM | POA: Diagnosis not present

## 2018-10-23 DIAGNOSIS — N185 Chronic kidney disease, stage 5: Secondary | ICD-10-CM | POA: Diagnosis not present

## 2018-10-23 DIAGNOSIS — R519 Headache, unspecified: Secondary | ICD-10-CM

## 2018-10-23 DIAGNOSIS — G8929 Other chronic pain: Secondary | ICD-10-CM

## 2018-10-23 MED ORDER — ESCITALOPRAM OXALATE 10 MG PO TABS: 5.0000 mg | ORAL_TABLET | Freq: Every day | ORAL | 2 refills | Status: DC

## 2018-10-23 NOTE — Telephone Encounter (Signed)
Pt fmla forms have been completed, copied, faxed and put in for scanning

## 2018-10-23 NOTE — Patient Instructions (Signed)
° ° ° °  If you have lab work done today you will be contacted with your lab results within the next 2 weeks.  If you have not heard from us then please contact us. The fastest way to get your results is to register for My Chart. ° ° °IF you received an x-ray today, you will receive an invoice from [facility]. Please contact  Radiology at [phone] with questions or concerns regarding your invoice.  ° °IF you received labwork today, you will receive an invoice from [facility]. Please contact [facility] at [phone] with questions or concerns regarding your invoice.  ° °Our billing staff will not be able to assist you with questions regarding bills from these companies. ° °You will be contacted with the lab results as soon as they are available. The fastest way to get your results is to activate your My Chart account. Instructions are located on the last page of this paperwork. If you have not heard from us regarding the results in 2 weeks, please contact this office. °  ° ° ° °

## 2018-10-23 NOTE — Progress Notes (Signed)
9/8/202012:21 PM  [name] 03/31/1965, 53 y.o., male [id number]  Chief Complaint  Patient presents with  . Tremors    followup from 10/16/2018. Says he has an appt with Kidney specialist on 11/02/2018. Says now he is only having tremors on the right side of the body    HPI:   Patient is a 52 y.o. 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 0 0 0 0 0  Number falls in past yr: 0 0 0 - -  Injury with Fall? 0 0 0 - -  Follow up - Falls evaluation completed Falls evaluation completed - -     Allergies  Allergen Reactions  . Beef-Derived Products Other (See Comments)    Cultural preference  . Pegademase Bovine Other (See Comments)    Cultural preference  . Poractant Alfa Other (See Comments)    Cultural preference  . Pork-Derived Products Other (See Comments)    Cultural preference    Prior to Admission medications   Medication Sig Start Date End Date Taking? Authorizing Provider  acetaminophen (TYLENOL) 500 MG tablet Take 1 tablet (500 mg total) by mouth every 6 (six) hours as needed for moderate pain. 02/01/17  Yes Stallings, Zoe A, MD  calcitRIOL (ROCALTROL) 0.25 MCG capsule Take 0.25 mcg by mouth daily. 09/22/17  Yes [provider]  calcium  acetate (PHOSLO) 667 MG capsule Take 2 capsules (1,334 mg total) by mouth 3 (three) times daily with meals. 09/26/18  Yes Forrest Moron, MD  cholecalciferol (VITAMIN D) 25 MCG (1000 UT) tablet Take 1,000 Units by mouth daily.   Yes [provider]  escitalopram (LEXAPRO) 10 MG tablet Take 1 tablet (10 mg total) by mouth at bedtime. 10/16/18  Yes Rutherford Guys, MD  furosemide (LASIX) 40 MG tablet Take 1 tablet (40 mg total) by mouth daily. 09/26/18  Yes Forrest Moron, MD  Icosapent Ethyl 1 g CAPS Take 2 capsules (2 g total) by mouth 2 (two) times daily. 10/15/18  Yes Hilty, Nadean Corwin, MD  labetalol (NORMODYNE) 200 MG tablet Take 2 tablets (400 mg total) by mouth 2 (two) times daily. 08/27/18  Yes Eugenie Filler, MD  rosuvastatin (CRESTOR) 10 MG tablet Take 10 mg by mouth daily.   Yes [provider]  Vitamin D, Ergocalciferol, (DRISDOL) 1.25 MG (50000 UT) CAPS capsule Take 1 capsule (50,000 Units total) by mouth every 7 (seven) days. 09/12/18  Yes Forrest Moron, MD    Past Medical History:  Diagnosis Date  . Anemia   . Back pain   . CKD (chronic kidney disease), stage III ([place])   . Colon polyps    adenomatous  . Elevated cholesterol   . History of  echocardiogram    Echo 11/17: EF 65-70, normal wall motion, grade 1 diastolic dysfunction, trivial MR, mild LAE, mild TR  . Hyperlipidemia   . Hypertension    no current bp meds for last 3 months  . Kidney stones 2007  . Medication intolerance    a. multiple with prior nonadherence to regimen.  . Otosclerosis of both ears   . PUD (peptic ulcer disease)    Has had unspecified surgery for this  . Sleep apnea     Past Surgical History:  Procedure Laterality Date  . NEPHRECTOMY  02/18/2011   Procedure: NEPHRECTOMY;  Surgeon: Hanley Ben, MD;  Location: [facility];  Service: Urology;  Laterality: Right;  . SMALL INTESTINE SURGERY    . STAPEDOTOMY  2005   lt ear jan, right ear sept  . surgery for ulcers  1990     Social History   Tobacco Use  . Smoking status: Never Smoker  . Smokeless tobacco: Never Used  Substance Use Topics  . Alcohol use: No    Family History  Problem Relation Age of Onset  . Hyperlipidemia Father   . Heart disease Mother   . Hypertension Mother   . Hypertension Sister   . Heart disease Brother 22       CABG  . Hyperlipidemia Sister   . Hyperlipidemia Sister   . Esophageal cancer Cousin   . Colon cancer Neg Hx   . Stomach cancer Neg Hx   . Rectal cancer Neg Hx     ROS Per hpi  OBJECTIVE:  Today's Vitals   10/23/18 1202  BP: (!) 168/81  Pulse: 72  Temp: 98.6 F (37 C)  SpO2: 99%  Weight: 264 lb (119.7 kg)  Height: 5\' 9"  (1.753 m)   Body mass index is 38.99 kg/m.  Wt Readings from Last 3 Encounters:  10/23/18 264 lb (119.7 kg)  10/16/18 263 lb (119.3 kg)  10/15/18 264 lb (119.7 kg)    Physical Exam Vitals signs and nursing note reviewed.  Constitutional:      Appearance: He is well-developed.  HENT:     Head: Normocephalic and atraumatic.  Eyes:     Conjunctiva/sclera: Conjunctivae normal.     Pupils: Pupils are equal, round, and reactive to light.  Neck:     Musculoskeletal: Neck supple.  Cardiovascular:     Rate and Rhythm: Normal rate and regular rhythm.     Heart sounds: No murmur. No friction rub. No gallop.   Pulmonary:     Effort: Pulmonary effort is normal.     Breath sounds: Normal breath sounds. No wheezing or rales.  Musculoskeletal:     Right lower leg: Edema (trace pitting) present.     Left lower leg: Edema (pitting +1) present.  Skin:    General: Skin is warm and dry.  Neurological:     Mental Status: He is alert and oriented to person, place, and time.     Motor: Tremor (fine intention tremor) present.     No results found for this or any previous visit (from the past 24 hour(s)).  No results found.   ASSESSMENT and PLAN  1. Tremors Improved. Decrease lexapro to 5mg  as improved with prior decrease.  Referring to neurology. FMLA paperwork to be completed. - Ambulatory referral to Neurology  2. Chronic primary headache Stable. Will cont to monitor as we decrease lexapro.  3. Chronic kidney disease (CKD), active medical management without dialysis, stage 5 ([place]) Has upcoming appt with renal.  4. Essential hypertension Uncontrolled in setting of not taking medications  Other orders - escitalopram (LEXAPRO) 10 MG tablet; Take 0.5 tablets (5 mg total) by mouth at bedtime.  Return in about 4 weeks (around 11/20/2018).    Rutherford Guys, MD Primary Care at [facility] [place], [place] [address] Ph.  [phone] Fax [phone]

## 2018-10-26 ENCOUNTER — Telehealth: Payer: Self-pay

## 2018-10-26 NOTE — Telephone Encounter (Signed)
Pt came in to get copy fmla forms. He understands the communication has been sent to his job that he is not to return until he says neurology. Referral was put in 9/8 [facility]

## 2018-11-02 DIAGNOSIS — Z905 Acquired absence of kidney: Secondary | ICD-10-CM | POA: Diagnosis not present

## 2018-11-02 DIAGNOSIS — I129 Hypertensive chronic kidney disease with stage 1 through stage 4 chronic kidney disease, or unspecified chronic kidney disease: Secondary | ICD-10-CM | POA: Diagnosis not present

## 2018-11-02 DIAGNOSIS — D509 Iron deficiency anemia, unspecified: Secondary | ICD-10-CM | POA: Diagnosis not present

## 2018-11-02 DIAGNOSIS — N184 Chronic kidney disease, stage 4 (severe): Secondary | ICD-10-CM | POA: Diagnosis not present

## 2018-11-02 DIAGNOSIS — N2581 Secondary hyperparathyroidism of renal origin: Secondary | ICD-10-CM | POA: Diagnosis not present

## 2018-11-02 DIAGNOSIS — D631 Anemia in chronic kidney disease: Secondary | ICD-10-CM | POA: Diagnosis not present

## 2018-11-02 DIAGNOSIS — N189 Chronic kidney disease, unspecified: Secondary | ICD-10-CM | POA: Diagnosis not present

## 2018-11-06 ENCOUNTER — Encounter (HOSPITAL_COMMUNITY): Payer: BLUE CROSS/BLUE SHIELD

## 2018-11-06 ENCOUNTER — Other Ambulatory Visit (HOSPITAL_COMMUNITY): Payer: BLUE CROSS/BLUE SHIELD

## 2018-11-06 ENCOUNTER — Encounter: Payer: BLUE CROSS/BLUE SHIELD | Admitting: Vascular Surgery

## 2018-11-17 DIAGNOSIS — G4733 Obstructive sleep apnea (adult) (pediatric): Secondary | ICD-10-CM | POA: Diagnosis not present

## 2018-11-19 DIAGNOSIS — N184 Chronic kidney disease, stage 4 (severe): Secondary | ICD-10-CM | POA: Diagnosis not present

## 2018-11-21 ENCOUNTER — Other Ambulatory Visit: Payer: Self-pay

## 2018-11-21 DIAGNOSIS — N185 Chronic kidney disease, stage 5: Secondary | ICD-10-CM

## 2018-11-22 ENCOUNTER — Encounter: Payer: Self-pay | Admitting: Family Medicine

## 2018-11-22 ENCOUNTER — Ambulatory Visit: Payer: BLUE CROSS/BLUE SHIELD | Admitting: Family Medicine

## 2018-11-22 ENCOUNTER — Other Ambulatory Visit: Payer: Self-pay

## 2018-11-22 VITALS — BP 132/84 | HR 91 | Temp 98.7°F | Ht 69.0 in | Wt 261.2 lb

## 2018-11-22 DIAGNOSIS — I10 Essential (primary) hypertension: Secondary | ICD-10-CM

## 2018-11-22 DIAGNOSIS — R079 Chest pain, unspecified: Secondary | ICD-10-CM

## 2018-11-22 DIAGNOSIS — N184 Chronic kidney disease, stage 4 (severe): Secondary | ICD-10-CM

## 2018-11-22 NOTE — Patient Instructions (Signed)
° ° ° °  If you have lab work done today you will be contacted with your lab results within the next 2 weeks.  If you have not heard from us then please contact us. The fastest way to get your results is to register for My Chart. ° ° °IF you received an x-ray today, you will receive an invoice from [facility]. Please contact [facility] at [phone] with questions or concerns regarding your invoice.  ° °IF you received labwork today, you will receive an invoice from [facility]. Please contact [facility] at [phone] with questions or concerns regarding your invoice.  ° °Our billing staff will not be able to assist you with questions regarding bills from these companies. ° °You will be contacted with the lab results as soon as they are available. The fastest way to get your results is to activate your My Chart account. Instructions are located on the last page of this paperwork. If you have not heard from us regarding the results in 2 weeks, please contact this office. °  ° ° ° °

## 2018-11-22 NOTE — Progress Notes (Signed)
10/8/20202:13 PM  Kahli Fitzgerald 04-14-1965, 53 y.o., male [id number]  Chief Complaint  Patient presents with  . Tremors    follow up, Kidney doctor has him taking the labetolol 2 tabs bid, says it make him very tired. Monitors bp  with the wristlet cuff daily, brought in cuff for log    HPI:   Patient is a 53 y.o. male with past medical history significant for CKD4, single kidney, OSA on cpap, dCHF who presents today for folllowup  Last OV sept 2020 - decreased lexapro further, referred to neurology, has appt for oct 14th Renal increased labelatol to 200mg  BID Took meds today - makes him tired Needs to take breaks/lie down 2-3 times a day Home BP cuff not accurate He reports that he continues to do better on lower dose of lexapro tremors mostly now to right hand, does get worse when he tires Edema better Was told that recent labs showed hyperkalemia Has appt with vasc surg for AV fistula eval Has not been working since our first appt in aug  Depression screen [facility] 2/9 11/22/2018 10/23/2018 09/26/2018  Decreased Interest 0 0 0  Down, Depressed, Hopeless 0 0 0  PHQ - 2 Score 0 0 0    Fall Risk  11/22/2018 10/23/2018 09/26/2018 09/12/2018 01/22/2018  Falls in the past year? 0 0 0 0 0  Number falls in past yr: 0 0 0 0 -  Injury with Fall? 0 0 0 0 -  Follow up - - Falls evaluation completed Falls evaluation completed -     Allergies  Allergen Reactions  . Beef-Derived Products Other (See Comments)    Cultural preference  . Pegademase Bovine Other (See Comments)    Cultural preference  . Poractant Alfa Other (See Comments)    Cultural preference  . Pork-Derived Products Other (See Comments)    Cultural preference    Prior to Admission medications   Medication Sig Start Date End Date Taking? Authorizing Provider  acetaminophen (TYLENOL) 500 MG tablet Take 1 tablet (500 mg total) by mouth every 6 (six) hours as needed for moderate pain. 02/01/17  Yes Stallings, Zoe A, MD   calcitRIOL (ROCALTROL) 0.25 MCG capsule Take 0.25 mcg by mouth daily. 09/22/17  Yes [provider]  calcium acetate (PHOSLO) 667 MG capsule Take 2 capsules (1,334 mg total) by mouth 3 (three) times daily with meals. 09/26/18  Yes Forrest Moron, MD  cholecalciferol (VITAMIN D) 25 MCG (1000 UT) tablet Take 1,000 Units by mouth daily.   Yes [provider]  escitalopram (LEXAPRO) 10 MG tablet Take 0.5 tablets (5 mg total) by mouth at bedtime. 10/23/18  Yes Rutherford Guys, MD  furosemide (LASIX) 40 MG tablet Take 1 tablet (40 mg total) by mouth daily. 09/26/18  Yes Forrest Moron, MD  Icosapent Ethyl 1 g CAPS Take 2 capsules (2 g total) by mouth 2 (two) times daily. 10/15/18  Yes Hilty, Nadean Corwin, MD  labetalol (NORMODYNE) 200 MG tablet Take 2 tablets (400 mg total) by mouth 2 (two) times daily. 08/27/18  Yes Eugenie Filler, MD  rosuvastatin (CRESTOR) 10 MG tablet Take 10 mg by mouth daily.   Yes [provider]  Vitamin D, Ergocalciferol, (DRISDOL) 1.25 MG (50000 UT) CAPS capsule Take 1 capsule (50,000 Units total) by mouth every 7 (seven) days. 09/12/18  Yes Forrest Moron, MD    Past Medical History:  Diagnosis Date  . Anemia   . Back pain   .  CKD (chronic kidney disease), stage III   . Colon polyps    adenomatous  . Elevated cholesterol   . History of echocardiogram    Echo 11/17: EF 65-70, normal wall motion, grade 1 diastolic dysfunction, trivial MR, mild LAE, mild TR  . Hyperlipidemia   . Hypertension    no current bp meds for last 3 months  . Kidney stones 2007  . Medication intolerance    a. multiple with prior nonadherence to regimen.  . Otosclerosis of both ears   . PUD (peptic ulcer disease)    Has had unspecified surgery for this  . Sleep apnea     Past Surgical History:  Procedure Laterality Date  . NEPHRECTOMY  02/18/2011   Procedure: NEPHRECTOMY;  Surgeon: Hanley Ben, MD;  Location: [facility];  Service: Urology;  Laterality: Right;  .  SMALL INTESTINE SURGERY    . STAPEDOTOMY  2005   lt ear jan, right ear sept  . surgery for ulcers  1990    Social History   Tobacco Use  . Smoking status: Never Smoker  . Smokeless tobacco: Never Used  Substance Use Topics  . Alcohol use: No    Family History  Problem Relation Age of Onset  . Hyperlipidemia Father   . Heart disease Mother   . Hypertension Mother   . Hypertension Sister   . Heart disease Brother 20       CABG  . Hyperlipidemia Sister   . Hyperlipidemia Sister   . Esophageal cancer Cousin   . Colon cancer Neg Hx   . Stomach cancer Neg Hx   . Rectal cancer Neg Hx     Review of Systems  Constitutional: Positive for malaise/fatigue. Negative for chills and fever.  Respiratory: Negative for cough and shortness of breath.   Cardiovascular: Negative for chest pain, palpitations and leg swelling.  Gastrointestinal: Negative for abdominal pain, nausea and vomiting.  Neurological: Positive for tremors.   Per hpi  OBJECTIVE:  Today's Vitals   11/22/18 1407 11/22/18 1450  BP: (!) 151/79 132/84  Pulse: 91   Temp: 98.7 F (37.1 C)   SpO2: 97%   Weight: 261 lb 3.2 oz (118.5 kg)   Height: 5\' 9"  (1.753 m)    Body mass index is 38.57 kg/m.   Physical Exam Vitals signs and nursing note reviewed.  Constitutional:      Appearance: He is well-developed.  HENT:     Head: Normocephalic and atraumatic.  Eyes:     Conjunctiva/sclera: Conjunctivae normal.     Pupils: Pupils are equal, round, and reactive to light.  Neck:     Musculoskeletal: Neck supple.  Cardiovascular:     Rate and Rhythm: Normal rate and regular rhythm.     Heart sounds: No murmur. No friction rub. No gallop.   Pulmonary:     Effort: Pulmonary effort is normal.     Breath sounds: Normal breath sounds. No wheezing, rhonchi or rales.  Musculoskeletal:     Right lower leg: Edema (trace pitting to distal chin) present.     Left lower leg: Edema present.  Skin:    General: Skin is warm  and dry.  Neurological:     Mental Status: He is alert and oriented to person, place, and time.     Motor: Tremor (right hand, fine, intention) present.     No results found for this or any previous visit (from the past 24 hour(s)).  No results found.   ASSESSMENT and PLAN  1. tremors Continues to improve at lower SSRI dose, no sign return of headache, tremor now seems to be mostly intentional. D/c lexapro. Pending neuro eval. Cont to excuse from work until neuro eval/recommendations  2. Essential hypertension Controlled. Managed by renal. Patient very fatigued, discussed might also be related to CKD4 worsening. Encouraged patient to discuss further with renal. - Care order/instruction:  3. CKD (chronic kidney disease) stage 4, GFR 15-29 ml/min (HCC) Managed by renal. Discussed low K diet. Upcoming appt for AV fistula.   Return for as scheduled with PCP.    Rutherford Guys, MD Primary Care at [place], [place] [address] Ph.  [phone] Fax [phone]

## 2018-11-26 ENCOUNTER — Encounter: Payer: Self-pay | Admitting: Family Medicine

## 2018-11-26 ENCOUNTER — Ambulatory Visit (INDEPENDENT_AMBULATORY_CARE_PROVIDER_SITE_OTHER): Payer: BLUE CROSS/BLUE SHIELD

## 2018-11-26 ENCOUNTER — Other Ambulatory Visit: Payer: Self-pay

## 2018-11-26 ENCOUNTER — Ambulatory Visit (INDEPENDENT_AMBULATORY_CARE_PROVIDER_SITE_OTHER): Payer: BLUE CROSS/BLUE SHIELD | Admitting: Family Medicine

## 2018-11-26 VITALS — BP 164/100 | HR 91 | Temp 98.7°F | Ht 69.0 in | Wt 258.2 lb

## 2018-11-26 DIAGNOSIS — R05 Cough: Secondary | ICD-10-CM | POA: Diagnosis not present

## 2018-11-26 DIAGNOSIS — J189 Pneumonia, unspecified organism: Secondary | ICD-10-CM | POA: Diagnosis not present

## 2018-11-26 DIAGNOSIS — I5032 Chronic diastolic (congestive) heart failure: Secondary | ICD-10-CM | POA: Diagnosis not present

## 2018-11-26 DIAGNOSIS — R058 Other specified cough: Secondary | ICD-10-CM

## 2018-11-26 DIAGNOSIS — D631 Anemia in chronic kidney disease: Secondary | ICD-10-CM

## 2018-11-26 DIAGNOSIS — N185 Chronic kidney disease, stage 5: Secondary | ICD-10-CM

## 2018-11-26 DIAGNOSIS — R093 Abnormal sputum: Secondary | ICD-10-CM

## 2018-11-26 DIAGNOSIS — I10 Essential (primary) hypertension: Secondary | ICD-10-CM

## 2018-11-26 MED ORDER — AZITHROMYCIN 250 MG PO TABS: ORAL_TABLET | ORAL | 0 refills | Status: DC
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
## 2018-11-26 NOTE — Patient Instructions (Addendum)
If you have lab work done today you will be contacted with your lab results within the next 2 weeks.  If you have not heard from [place] then please contact us. The fastest way to get your results is to register for My Chart.   IF you received an x-ray today, you will receive an invoice from [facility]. Please contact [facility] at [phone] with questions or concerns regarding your invoice.   IF you received labwork today, you will receive an invoice from [facility]. Please contact [facility] at [phone] with questions or concerns regarding your invoice.   Our billing staff will not be able to assist you with questions regarding bills from these companies.  You will be contacted with the lab results as soon as they are available. The fastest way to get your results is to activate your My Chart account. Instructions are located on the last page of this paperwork. If you have not heard from [place] regarding the results in 2 weeks, please contact this office.      Community-Acquired Pneumonia, Adult Pneumonia is a type of lung infection that causes swelling in the airways of the lungs. Mucus and fluid may also build up inside the airways. This may cause coughing and difficulty breathing. There are different types of pneumonia. One type can develop while a person is in a hospital. A different type is called community-acquired pneumonia. It develops in people who are not, and have not recently been, in the hospital or another type of health care facility. What are the causes? This condition may be caused by:  Viruses. This is the most common cause of pneumonia.  Bacteria. Community-acquired pneumonia is often caused by Streptococcus pneumoniae bacteria. These bacteria are often passed from one person to another by breathing in droplets from the cough or sneeze of an infected person.  Fungi. This is the least common cause of pneumonia. What increases the risk? The following  factors may make you more likely to develop this condition:  Having a chronic disease, such as chronic obstructive pulmonary disease (COPD), asthma, congestive heart failure, cystic fibrosis, diabetes, or kidney disease.  Having early-stage or late-stage HIV.  Having sickle cell disease.  Having had your spleen removed (splenectomy).  Having poor dental hygiene.  Having a medical condition that increases the risk of breathing in (aspirating) secretions from your own mouth and nose.  Having a weakened body defense system (immune system).  Being a smoker.  Traveling to areas where pneumonia-causing germs commonly exist.  Being around animal habitats or animals that have pneumonia-causing germs, including birds, bats, rabbits, cats, and farm animals. What are the signs or symptoms? Symptoms of this condition include:  A dry cough.  A wet (productive) cough.  Fever.  Sweating.  Chest pain, especially when breathing deeply or coughing.  Rapid breathing or difficulty breathing.  Shortness of breath.  Shaking chills.  Fatigue.  Muscle aches. How is this diagnosed? This condition may be diagnosed based on:  Your medical history.  A physical exam. You may also have tests, including:  Chest X-rays.  Tests of your blood oxygen level and other blood gases.  Tests on blood, mucus (sputum), fluid around your lungs (pleural fluid), and urine. If your pneumonia is severe, other tests may be done to find the exact cause of your illness. How is this treated? Treatment for this condition depends on many factors, such as the cause of your pneumonia, the medicines you take, and other medical  conditions that you have. For most adults, treatment and recovery from pneumonia may occur at home. In some cases, treatment must happen in a hospital. Treatment may include:  Medicines that are given by mouth or through an IV, including: ? Antibiotic medicines, if the pneumonia was  caused by bacteria. ? Antiviral medicines, if the pneumonia was caused by a virus.  Being given extra oxygen.  Respiratory therapy. Although rare, treating severe pneumonia may include:  Using a machine to help you breathe (mechanical ventilation). This is done if you are not breathing well on your own and you cannot maintain a safe blood oxygen level.  Thoracentesis. This is a procedure to remove fluid from around one lung or both lungs to help you breathe better. Follow these instructions at home:  Medicines  Take over-the-counter and prescription medicines only as told by your health care provider. ? Only take cough medicine if you are losing sleep. Be aware that cough medicine can prevent your body's natural ability to remove mucus from your lungs.  If you were prescribed an antibiotic medicine, take it as told by your health care provider. Do not stop taking the antibiotic even if you start to feel better. General instructions  Sleep in a semi-upright position at night. Try sleeping in a reclining chair, or place a few pillows under your head.  Rest as needed and get at least 8 hours of sleep each night.  Drink enough water to keep your urine pale yellow. This will help to thin out mucus secretions in your lungs.  Eat a healthy diet that includes plenty of vegetables, fruits, whole grains, low-fat dairy products, and lean protein.  Do not use any products that contain nicotine or tobacco, such as cigarettes, e-cigarettes, and chewing tobacco. If you need help quitting, ask your health care provider.  Keep all follow-up visits as told by your health care provider. This is important. How is this prevented? You can lower your risk of developing community-acquired pneumonia by:  Getting a pneumococcal vaccine. There are different types and schedules of pneumococcal vaccines. Ask your health care provider which option is best for you. Consider getting the vaccine if: ? You are  older than 53 years of age. ? You are older than 53 years of age and are undergoing cancer treatment, have chronic lung disease, or have other medical conditions that affect your immune system. Ask your health care provider if this applies to you.  Getting an influenza vaccine every year. Ask your health care provider which type of vaccine is best for you.  Getting regular checkups from your dentist.  Washing your hands often. If soap and water are not available, use hand sanitizer. Contact a health care provider if:  You have a fever.  You are losing sleep because you cannot control your cough with cough medicine. Get help right away if:  You have worsening shortness of breath.  You have increased chest pain.  Your sickness becomes worse, especially if you are an older adult or have a weakened immune system.  You cough up blood. Summary  Pneumonia is an infection of the lungs.  Community-acquired pneumonia develops in people who have not been in the hospital. It can be caused by bacteria, viruses, or fungi.  This condition may be treated with antibiotics or antiviral medicines.  Severe cases may require hospitalization, mechanical ventilation, and other procedures to drain fluid from the lungs. This information is not intended to replace advice given to you by your  health care provider. Make sure you discuss any questions you have with your health care provider. Document Released: 01/31/2005 Document Revised: 09/28/2017 Document Reviewed: 09/28/2017 Elsevier Patient Education  2020 [employer].

## 2018-11-26 NOTE — Progress Notes (Signed)
Established Patient Office Visit  Subjective:  Patient ID: [name], male    DOB: October 13, 1965  Age: 53 y.o. MRN: [id number]  CC:  Chief Complaint  Patient presents with  . Congestive Heart Failure    F/U     HPI [name] presents for   Follow up for his blood pressure When he wakes up in the morning he feels better but by the time he takes his blood pressure medication he feels tired He reports that the blood pressure is 145-160/95-120 He takes his medications Lasix as well as icosapent Ethyl and Labetalol  For his dyslipidemia and hypertriglyceridemia he is on icosapent ethyl and crestor   Patient reports that he has fatigue and feels like he is just tired He also has anemia and CKD stage IV and will getting a fistula placed He has been kept out of work because of his increasing fatigue.    Congestion and Sputum Production He has a frothy white sputum that he brought in to show [place]  He reports that he has a lot of that frothiness on the pillow daily He reports that he has been having increasing fluid and is concerned as to what this is.   Past Medical History:  Diagnosis Date  . Anemia   . Back pain   . CKD (chronic kidney disease), stage III   . Colon polyps    adenomatous  . Elevated cholesterol   . History of echocardiogram    Echo 11/17: EF 65-70, normal wall motion, grade 1 diastolic dysfunction, trivial MR, mild LAE, mild TR  . Hyperlipidemia   . Hypertension    no current bp meds for last 3 months  . Kidney stones 2007  . Medication intolerance    a. multiple with prior nonadherence to regimen.  . Otosclerosis of both ears   . PUD (peptic ulcer disease)    Has had unspecified surgery for this  . Sleep apnea     Past Surgical History:  Procedure Laterality Date  . NEPHRECTOMY  02/18/2011   Procedure: NEPHRECTOMY;  Surgeon: Hanley Ben, MD;  Location: [facility];  Service: Urology;  Laterality: Right;  . SMALL INTESTINE SURGERY    . STAPEDOTOMY   2005   lt ear jan, right ear sept  . surgery for ulcers  1990    Family History  Problem Relation Age of Onset  . Hyperlipidemia Father   . Heart disease Mother   . Hypertension Mother   . Hypertension Sister   . Heart disease Brother 42       CABG  . Hyperlipidemia Sister   . Hyperlipidemia Sister   . Esophageal cancer Cousin   . Colon cancer Neg Hx   . Stomach cancer Neg Hx   . Rectal cancer Neg Hx     Social History   Socioeconomic History  . Marital status: Married    Spouse name: Edgardo Roys  . Number of children: 5  . Years of education: Not on file  . Highest education level: 12th grade  Occupational History    Employer: [employer]  Social Needs  . Financial resource strain: Not hard at all  . Food insecurity    Worry: Never true    Inability: Never true  . Transportation needs    Medical: No    Non-medical: No  Tobacco Use  . Smoking status: Never Smoker  . Smokeless tobacco: Never Used  Substance and Sexual Activity  . Alcohol use: No  . Drug  use: No  . Sexual activity: Yes    Birth control/protection: None  Lifestyle  . Physical activity    Days per week: 0 days    Minutes per session: 0 min  . Stress: Not at all  Relationships  . Social connections    Talks on phone: More than three times a week    Gets together: More than three times a week    Attends religious service: More than 4 times per year    Active member of club or organization: No    Attends meetings of clubs or organizations: Never    Relationship status: Married  . Intimate partner violence    Fear of current or ex partner: No    Emotionally abused: No    Physically abused: No    Forced sexual activity: No  Other Topics Concern  . Not on file  Social History Narrative   Lives at home with wife and family. He is from [place]. Came to the [place] in 2002.      Patient is right-handed. He lives in a one level home. He drinks 1-2 cups of coffee and tea a day. He does not exercise.     Outpatient Medications Prior to Visit  Medication Sig Dispense Refill  . acetaminophen (TYLENOL) 500 MG tablet Take 1 tablet (500 mg total) by mouth every 6 (six) hours as needed for moderate pain. 30 tablet 0  . calcitRIOL (ROCALTROL) 0.25 MCG capsule Take 0.25 mcg by mouth daily.  11  . calcium acetate (PHOSLO) 667 MG capsule Take 2 capsules (1,334 mg total) by mouth 3 (three) times daily with meals. 180 capsule 3  . cholecalciferol (VITAMIN D) 25 MCG (1000 UT) tablet Take 1,000 Units by mouth daily.    . furosemide (LASIX) 40 MG tablet Take 1 tablet (40 mg total) by mouth daily. 90 tablet 0  . Icosapent Ethyl 1 g CAPS Take 2 capsules (2 g total) by mouth 2 (two) times daily. 120 capsule 11  . labetalol (NORMODYNE) 200 MG tablet Take 2 tablets (400 mg total) by mouth 2 (two) times daily. 120 tablet 0  . rosuvastatin (CRESTOR) 10 MG tablet Take 10 mg by mouth daily.    . Vitamin D, Ergocalciferol, (DRISDOL) 1.25 MG (50000 UT) CAPS capsule Take 1 capsule (50,000 Units total) by mouth every 7 (seven) days. 4 capsule 6   No facility-administered medications prior to visit.     Allergies  Allergen Reactions  . Beef-Derived Products Other (See Comments)    Cultural preference  . Pegademase Bovine Other (See Comments)    Cultural preference  . Poractant Alfa Other (See Comments)    Cultural preference  . Pork-Derived Products Other (See Comments)    Cultural preference    ROS Review of Systems Review of Systems  Constitutional: Negative for activity change, appetite change, chills and fever.  HENT: Negative for congestion, nosebleeds, trouble swallowing and voice change.   Respiratory: Negative for cough, shortness of breath and wheezing.   Gastrointestinal: Negative for diarrhea, nausea and vomiting.  Genitourinary: Negative for difficulty urinating, dysuria, flank pain and hematuria.  Musculoskeletal: Negative for back pain, joint swelling and neck pain.  Neurological: Negative  for dizziness, speech difficulty, light-headedness and numbness.  See HPI. All other review of systems negative.     Objective:    Physical Exam  BP (!) 164/100   Pulse 91   Temp 98.7 F (37.1 C) (Oral)   Ht 5\' 9"  (1.753 m)  Wt 258 lb 3.2 oz (117.1 kg)   SpO2 95%   BMI 38.13 kg/m  Wt Readings from Last 3 Encounters:  11/26/18 258 lb 3.2 oz (117.1 kg)  11/22/18 261 lb 3.2 oz (118.5 kg)  10/23/18 264 lb (119.7 kg)   Physical Exam  Constitutional: Oriented to person, place, and time. Appears well-developed and well-nourished.  HENT:  Head: Normocephalic and atraumatic.  Eyes: Conjunctivae and EOM are normal.  Cardiovascular: Normal rate, regular rhythm, normal heart sounds and intact distal pulses.  No murmur heard. Pulmonary/Chest: Effort normal and breath sounds normal. No stridor. No respiratory distress. scant wheezes.  Neurological: Is alert and oriented to person, place, and time.  Skin: Skin is warm. Capillary refill takes less than 2 seconds.  Psychiatric: Has a normal mood and affect. Behavior is normal. Judgment and thought content normal.    Health Maintenance Due  Topic Date Due  . COLONOSCOPY  09/16/2018    There are no preventive care reminders to display for this patient.  Lab Results  Component Value Date   TSH 1.125 08/25/2018   Lab Results  Component Value Date   WBC 4.2 10/16/2018   HGB 9.5 (L) 10/16/2018   HCT 29.6 (L) 10/16/2018   MCV 85 10/16/2018   PLT 178 10/16/2018   Lab Results  Component Value Date   NA 142 10/16/2018   K 5.2 10/16/2018   CO2 18 (L) 10/16/2018   GLUCOSE 110 (H) 10/16/2018   BUN 69 (H) 10/16/2018   CREATININE 5.43 (HH) 10/16/2018   BILITOT 0.3 10/16/2018   ALKPHOS 40 10/16/2018   AST 18 10/16/2018   ALT 15 10/16/2018   PROT 6.6 10/16/2018   ALBUMIN 4.4 10/16/2018   CALCIUM 8.4 (L) 10/16/2018   ANIONGAP 11 08/27/2018   GFR 54.07 (L) 05/06/2014   Lab Results  Component Value Date   CHOL 313 (H) 08/24/2018    Lab Results  Component Value Date   HDL 24 (L) 08/24/2018   Lab Results  Component Value Date   LDLCALC Comment 08/24/2018   Lab Results  Component Value Date   TRIG 1,340 (HH) 08/24/2018   Lab Results  Component Value Date   CHOLHDL 13.0 (H) 08/24/2018   Lab Results  Component Value Date   HGBA1C 5.4 10/11/2017      Assessment & Plan:   Problem List Items Addressed This Visit      Cardiovascular and Mediastinum   Uncontrolled hypertension  - bp elevated, this is a chronic issue He is seeing Nephrology and will get a cath placed for dialysis    Other Visit Diagnoses    Cough with frothy sputum    -  Primary   Relevant Orders   DG Chest 2 View (Completed)   Pneumonia of left lower lobe due to infectious organism       Relevant Medications   azithromycin (ZITHROMAX) 250 MG tablet   Community acquired pneumonia of left lower lobe of lung    -  Will treat with zpak for early pneumonia   Relevant Medications   azithromycin (ZITHROMAX) 250 MG tablet   Chronic kidney disease (CKD), active medical management without dialysis, stage 5 ([place])    - pt will start Dialysis based on Nephrology   Chronic diastolic CHF (congestive heart failure) ([facility])       Anemia in stage 5 chronic kidney disease, not on chronic dialysis ([place])          Meds ordered this encounter  Medications  .  azithromycin (ZITHROMAX) 250 MG tablet    Sig: Take 2 tablets today, then one tablet each day after.    Dispense:  6 tablet    Refill:  0    Follow-up: No follow-ups on file.    Forrest Moron, MD

## 2018-11-26 NOTE — Progress Notes (Signed)
.  h 

## 2018-11-27 ENCOUNTER — Ambulatory Visit: Payer: BLUE CROSS/BLUE SHIELD | Admitting: Vascular Surgery

## 2018-11-27 ENCOUNTER — Other Ambulatory Visit: Payer: Self-pay

## 2018-11-27 ENCOUNTER — Ambulatory Visit (INDEPENDENT_AMBULATORY_CARE_PROVIDER_SITE_OTHER)
Admission: RE | Admit: 2018-11-27 | Discharge: 2018-11-27 | Disposition: A | Discharge status: Home / Self Care | Payer: BLUE CROSS/BLUE SHIELD | Source: Ambulatory Visit | Attending: Family | Admitting: Family

## 2018-11-27 ENCOUNTER — Ambulatory Visit (HOSPITAL_COMMUNITY)
Admission: RE | Admit: 2018-11-27 | Discharge: 2018-11-27 | Disposition: A | Discharge status: Home / Self Care | Payer: BLUE CROSS/BLUE SHIELD | Source: Ambulatory Visit | Attending: Family | Admitting: Family

## 2018-11-27 ENCOUNTER — Encounter: Payer: Self-pay | Admitting: Vascular Surgery

## 2018-11-27 VITALS — BP 134/80 | HR 79 | Temp 98.7°F | Resp 18 | Ht 69.0 in | Wt 258.0 lb

## 2018-11-27 DIAGNOSIS — N184 Chronic kidney disease, stage 4 (severe): Secondary | ICD-10-CM | POA: Diagnosis not present

## 2018-11-27 DIAGNOSIS — N185 Chronic kidney disease, stage 5: Secondary | ICD-10-CM

## 2018-11-27 NOTE — Progress Notes (Signed)
Patient name: [name] MRN: [id number] DOB: Jul 08, 1965 Sex: male  REASON FOR CONSULT: Evaluate for new dialysis access  HPI: [name] is a 53 y.o. male, with chronic kidney disease that presents for evaluation of new dialysis access.  Per nephrology notes he had a nephrectomy in 2013 for hydronephrosis as well as underlying blood pressure issues.  Ultimately he has been followed by nephrology and his creatinine has continued to rise with a solitary kidney.  Patient reports that he is right-handed.  He still working.  No previous dialysis access in the past.  Does not have a catheter.  No chest wall implants.  No numbness or tingling in his hand.  Past Medical History:  Diagnosis Date  . Anemia   . Back pain   . CKD (chronic kidney disease), stage III   . Colon polyps    adenomatous  . Elevated cholesterol   . History of echocardiogram    Echo 11/17: EF 65-70, normal wall motion, grade 1 diastolic dysfunction, trivial MR, mild LAE, mild TR  . Hyperlipidemia   . Hypertension    no current bp meds for last 3 months  . Kidney stones 2007  . Medication intolerance    a. multiple with prior nonadherence to regimen.  . Otosclerosis of both ears   . PUD (peptic ulcer disease)    Has had unspecified surgery for this  . Sleep apnea     Past Surgical History:  Procedure Laterality Date  . NEPHRECTOMY  02/18/2011   Procedure: NEPHRECTOMY;  Surgeon: Hanley Ben, MD;  Location: [facility];  Service: Urology;  Laterality: Right;  . SMALL INTESTINE SURGERY    . STAPEDOTOMY  2005   lt ear jan, right ear sept  . surgery for ulcers  1990    Family History  Problem Relation Age of Onset  . Hyperlipidemia Father   . Heart disease Mother   . Hypertension Mother   . Hypertension Sister   . Heart disease Brother 62       CABG  . Hyperlipidemia Sister   . Hyperlipidemia Sister   . Esophageal cancer Cousin   . Colon cancer Neg Hx   . Stomach cancer Neg Hx   . Rectal cancer Neg Hx     SOCIAL HISTORY: Social History   Socioeconomic History  . Marital status: Married    Spouse name: [name]  . Number of children: 5  . Years of education: Not on file  . Highest education level: 12th grade  Occupational History    Employer: [employer]  Social Needs  . Financial resource strain: Not hard at all  . Food insecurity    Worry: Never true    Inability: Never true  . Transportation needs    Medical: No    Non-medical: No  Tobacco Use  . Smoking status: Never Smoker  . Smokeless tobacco: Never Used  Substance and Sexual Activity  . Alcohol use: No  . Drug use: No  . Sexual activity: Yes    Birth control/protection: None  Lifestyle  . Physical activity    Days per week: 0 days    Minutes per session: 0 min  . Stress: Not at all  Relationships  . Social connections    Talks on phone: More than three times a week    Gets together: More than three times a week    Attends religious service: More than 4 times per year    Active member of club or  organization: No    Attends meetings of clubs or organizations: Never    Relationship status: Married  . Intimate partner violence    Fear of current or ex partner: No    Emotionally abused: No    Physically abused: No    Forced sexual activity: No  Other Topics Concern  . Not on file  Social History Narrative   Lives at home with wife and family. He is from [place]. Came to the [place] in 2002.      Patient is right-handed. He lives in a one level home. He drinks 1-2 cups of coffee and tea a day. He does not exercise.    Allergies  Allergen Reactions  . Beef-Derived Products Other (See Comments)    Cultural preference  . Pegademase Bovine Other (See Comments)    Cultural preference  . Poractant Alfa Other (See Comments)    Cultural preference  . Pork-Derived Products Other (See Comments)    Cultural preference    Current Outpatient Medications  Medication Sig Dispense Refill  . acetaminophen (TYLENOL) 500 MG  tablet Take 1 tablet (500 mg total) by mouth every 6 (six) hours as needed for moderate pain. 30 tablet 0  . azithromycin (ZITHROMAX) 250 MG tablet Take 2 tablets today, then one tablet each day after. 6 tablet 0  . calcitRIOL (ROCALTROL) 0.25 MCG capsule Take 0.25 mcg by mouth daily.  11  . calcium acetate (PHOSLO) 667 MG capsule Take 2 capsules (1,334 mg total) by mouth 3 (three) times daily with meals. 180 capsule 3  . cholecalciferol (VITAMIN D) 25 MCG (1000 UT) tablet Take 1,000 Units by mouth daily.    . furosemide (LASIX) 40 MG tablet Take 1 tablet (40 mg total) by mouth daily. 90 tablet 0  . Icosapent Ethyl 1 g CAPS Take 2 capsules (2 g total) by mouth 2 (two) times daily. 120 capsule 11  . labetalol (NORMODYNE) 200 MG tablet Take 2 tablets (400 mg total) by mouth 2 (two) times daily. 120 tablet 0  . rosuvastatin (CRESTOR) 10 MG tablet Take 10 mg by mouth daily.    . Vitamin D, Ergocalciferol, (DRISDOL) 1.25 MG (50000 UT) CAPS capsule Take 1 capsule (50,000 Units total) by mouth every 7 (seven) days. 4 capsule 6   No current facility-administered medications for this visit.     REVIEW OF SYSTEMS:  [X]  denotes positive finding, [ ]  denotes negative finding Cardiac  Comments:  Chest pain or chest pressure:    Shortness of breath upon exertion:    Short of breath when lying flat:    Irregular heart rhythm:        Vascular    Pain in calf, thigh, or hip brought on by ambulation:    Pain in feet at night that wakes you up from your sleep:     Blood clot in your veins:    Leg swelling:         Pulmonary    Oxygen at home: x   Productive cough:     Wheezing:         Neurologic    Sudden weakness in arms or legs:     Sudden numbness in arms or legs:     Sudden onset of difficulty speaking or slurred speech:    Temporary loss of vision in one eye:     Problems with dizziness:         Gastrointestinal    Blood in stool:     Vomited  blood:         Genitourinary    Burning  when urinating:     Blood in urine:        Psychiatric    Major depression:         Hematologic    Bleeding problems:    Problems with blood clotting too easily:        Skin    Rashes or ulcers:        Constitutional    Fever or chills:      PHYSICAL EXAM: Vitals:   11/27/18 1217  BP: 134/80  Pulse: 79  Resp: 18  Temp: 98.7 F (37.1 C)  TempSrc: Temporal  SpO2: 98%  Weight: 258 lb (117 kg)  Height: 5\' 9"  (1.753 m)    GENERAL: The patient is a well-nourished male, in no acute distress. The vital signs are documented above. CARDIAC: There is a regular rate and rhythm.  VASCULAR:  2+ radial pulse palpable bilateral upper extremity 2+ brachial pulse palpable bilateral upper extremity No upper extremity tissue loss PULMONARY: There is good air exchange bilaterally without wheezing or rales. ABDOMEN: Soft and non-tender with normal pitched bowel sounds.  MUSCULOSKELETAL: There are no major deformities or cyanosis. NEUROLOGIC: No focal weakness or paresthesias are detected. SKIN: There are no ulcers or rashes noted. PSYCHIATRIC: The patient has a normal affect.  DATA:   Arterial duplex shows triphasic waveforms in both brachial arteries with biphasic waveforms at the wrists.  Vein mapping shows a small cephalic vein on the left but a nice basilic vein that would be an option for fistula placement given he is right-handed.  Assessment/Plan:  53 year old male with chronic kidney disease in the setting of hypertension and solitary kidney given previous nephrectomy that presents for permanent dialysis access evaluation.  Given the patient is right-handed discussed plans for left arm AV fistula placement.  His cephalic vein is small in the left arm, but he does have an option with his basilic vein potentially could be at either wrist or higher on the arm.  Did discuss this is often done in two stages if we have to do a brachiobasilic.  Patient seems pretty unclear on his  clinical course as it relates to dialysis needs.  Discussed that fistula takes 2 to 3 months to mature and then he would require hemodialysis on average of 3 times a week.  This seem to be a surprise to him given that he wants to work.  He wants to discuss with his kidney doctor before scheduling today.  He will call back and contact our office when he is ready and we will plan for left arm AV fistula placement in the near future when he is ready to get on the OR schedule.   [name], MD [facility] Office: [phone] Pager: [phone]

## 2018-11-28 ENCOUNTER — Ambulatory Visit: Payer: BLUE CROSS/BLUE SHIELD | Admitting: Neurology

## 2018-11-28 ENCOUNTER — Encounter: Payer: Self-pay | Admitting: Neurology

## 2018-11-28 VITALS — BP 178/109 | HR 77 | Ht 70.0 in | Wt 257.8 lb

## 2018-11-28 DIAGNOSIS — G44219 Episodic tension-type headache, not intractable: Secondary | ICD-10-CM | POA: Diagnosis not present

## 2018-11-28 DIAGNOSIS — R251 Tremor, unspecified: Secondary | ICD-10-CM

## 2018-11-28 DIAGNOSIS — I10 Essential (primary) hypertension: Secondary | ICD-10-CM

## 2018-11-28 DIAGNOSIS — E889 Metabolic disorder, unspecified: Secondary | ICD-10-CM | POA: Diagnosis not present

## 2018-11-28 NOTE — Patient Instructions (Addendum)
1.  Limit use of Tylenol to no more than 2 days out of week to prevent frequent headache 2.  Follow up in 4 months.

## 2018-12-03 DIAGNOSIS — N2581 Secondary hyperparathyroidism of renal origin: Secondary | ICD-10-CM | POA: Diagnosis not present

## 2018-12-03 DIAGNOSIS — D631 Anemia in chronic kidney disease: Secondary | ICD-10-CM | POA: Diagnosis not present

## 2018-12-03 DIAGNOSIS — Z905 Acquired absence of kidney: Secondary | ICD-10-CM | POA: Diagnosis not present

## 2018-12-03 DIAGNOSIS — D509 Iron deficiency anemia, unspecified: Secondary | ICD-10-CM | POA: Diagnosis not present

## 2018-12-03 DIAGNOSIS — I129 Hypertensive chronic kidney disease with stage 1 through stage 4 chronic kidney disease, or unspecified chronic kidney disease: Secondary | ICD-10-CM | POA: Diagnosis not present

## 2018-12-03 DIAGNOSIS — N184 Chronic kidney disease, stage 4 (severe): Secondary | ICD-10-CM | POA: Diagnosis not present

## 2018-12-03 DIAGNOSIS — N189 Chronic kidney disease, unspecified: Secondary | ICD-10-CM | POA: Diagnosis not present

## 2018-12-05 ENCOUNTER — Other Ambulatory Visit: Payer: Self-pay

## 2018-12-10 ENCOUNTER — Telehealth: Payer: Self-pay | Admitting: Family Medicine

## 2018-12-10 ENCOUNTER — Encounter: Payer: Self-pay | Admitting: Family Medicine

## 2018-12-10 ENCOUNTER — Other Ambulatory Visit: Payer: Self-pay

## 2018-12-10 ENCOUNTER — Ambulatory Visit (INDEPENDENT_AMBULATORY_CARE_PROVIDER_SITE_OTHER): Payer: BLUE CROSS/BLUE SHIELD | Admitting: Family Medicine

## 2018-12-10 VITALS — BP 169/85 | HR 84 | Temp 98.5°F | Resp 16 | Ht 69.0 in | Wt 258.2 lb

## 2018-12-10 DIAGNOSIS — J189 Pneumonia, unspecified organism: Secondary | ICD-10-CM | POA: Diagnosis not present

## 2018-12-10 DIAGNOSIS — E875 Hyperkalemia: Secondary | ICD-10-CM

## 2018-12-10 DIAGNOSIS — N184 Chronic kidney disease, stage 4 (severe): Secondary | ICD-10-CM

## 2018-12-10 DIAGNOSIS — I10 Essential (primary) hypertension: Secondary | ICD-10-CM

## 2018-12-10 NOTE — Patient Instructions (Addendum)
  Please sign medical release that is in your hand and give it to front desk.   If you have lab work done today you will be contacted with your lab results within the next 2 weeks.  If you have not heard from [place] then please contact us. The fastest way to get your results is to register for My Chart.   IF you received an x-ray today, you will receive an invoice from [facility]. Please contact [facility] at [phone] with questions or concerns regarding your invoice.   IF you received labwork today, you will receive an invoice from [facility]. Please contact [facility] at [phone] with questions or concerns regarding your invoice.   Our billing staff will not be able to assist you with questions regarding bills from these companies.  You will be contacted with the lab results as soon as they are available. The fastest way to get your results is to activate your My Chart account. Instructions are located on the last page of this paperwork. If you have not heard from [place] regarding the results in 2 weeks, please contact this office.

## 2018-12-10 NOTE — Telephone Encounter (Signed)
12/10/2018 - PATIENT SAW DR. Nolon Rod ON Monday (12/10/2018) FOR A 2 WEEK FOLLOW-UP. PATIENT BROUGHT A RELEASE FORM FROM [employer] FOR DR. STALLINGS THAT HE HAS SIGNED. DR. Nolon Rod WOULD LIKE THIS RELEASE FORM TO BE SCANNED INTO THE SYSTEM SO THAT WHEN [employer] FAXES OVER THE FORMS FOR HER TO FILL OUT SHE CAN LOOK IN THE COMPUTER TO SEE HIS RELEASE INSTEAD OF LOOKING FOR IT IN HER BOX. [place]

## 2018-12-10 NOTE — Progress Notes (Signed)
Established Patient Office Visit  Subjective:  Patient ID: [name], male    DOB: Apr 06, 1965  Age: 53 y.o. MRN: [id number]  CC:  Chief Complaint  Patient presents with  . Cough    2 weeks follow up  . Pneumonia    HPI [name] presents for   Pt reports that his cough is better and he is not spitting up as much clear sputum His cough is improving He completed abx His breathing has improved and the sputum is much less.     Past Medical History:  Diagnosis Date  . Anemia   . Back pain   . CKD (chronic kidney disease), stage III   . Colon polyps    adenomatous  . Elevated cholesterol   . History of echocardiogram    Echo 11/17: EF 65-70, normal wall motion, grade 1 diastolic dysfunction, trivial MR, mild LAE, mild TR  . Hyperlipidemia   . Hypertension    no current bp meds for last 3 months  . Kidney stones 2007  . Medication intolerance    a. multiple with prior nonadherence to regimen.  . Otosclerosis of both ears   . PUD (peptic ulcer disease)    Has had unspecified surgery for this  . Sleep apnea     Past Surgical History:  Procedure Laterality Date  . NEPHRECTOMY  02/18/2011   Procedure: NEPHRECTOMY;  Surgeon: Hanley Ben, MD;  Location: [facility];  Service: Urology;  Laterality: Right;  . SMALL INTESTINE SURGERY    . STAPEDOTOMY  2005   lt ear jan, right ear sept  . surgery for ulcers  1990    Family History  Problem Relation Age of Onset  . Hyperlipidemia Father   . Heart disease Mother   . Hypertension Mother   . Hypertension Sister   . Heart disease Brother 57       CABG  . Hyperlipidemia Sister   . Hyperlipidemia Sister   . Esophageal cancer Cousin   . Healthy Child   . Colon cancer Neg Hx   . Stomach cancer Neg Hx   . Rectal cancer Neg Hx     Social History   Socioeconomic History  . Marital status: Married    Spouse name: Edgardo Roys  . Number of children: 5  . Years of education: Not on file  . Highest education level: 12th grade   Occupational History    Employer: [employer]  Social Needs  . Financial resource strain: Not hard at all  . Food insecurity    Worry: Never true    Inability: Never true  . Transportation needs    Medical: No    Non-medical: No  Tobacco Use  . Smoking status: Never Smoker  . Smokeless tobacco: Never Used  Substance and Sexual Activity  . Alcohol use: No  . Drug use: No  . Sexual activity: Yes    Birth control/protection: None  Lifestyle  . Physical activity    Days per week: 0 days    Minutes per session: 0 min  . Stress: Not at all  Relationships  . Social connections    Talks on phone: More than three times a week    Gets together: More than three times a week    Attends religious service: More than 4 times per year    Active member of club or organization: No    Attends meetings of clubs or organizations: Never    Relationship status: Married  . Intimate  partner violence    Fear of current or ex partner: No    Emotionally abused: No    Physically abused: No    Forced sexual activity: No  Other Topics Concern  . Not on file  Social History Narrative   Lives at home with wife and family. He is from [place]. Came to the [place] in 2002.      Patient is right-handed. He lives in a one level home. He drinks 1-2 cups of coffee and tea a day. He does not exercise.    Outpatient Medications Prior to Visit  Medication Sig Dispense Refill  . azithromycin (ZITHROMAX) 250 MG tablet Take 2 tablets today, then one tablet each day after. 6 tablet 0  . calcitRIOL (ROCALTROL) 0.25 MCG capsule Take 0.25 mcg by mouth daily.  11  . calcium acetate (PHOSLO) 667 MG capsule Take 2 capsules (1,334 mg total) by mouth 3 (three) times daily with meals. 180 capsule 3  . cholecalciferol (VITAMIN D) 25 MCG (1000 UT) tablet Take 1,000 Units by mouth daily.    . furosemide (LASIX) 40 MG tablet Take 1 tablet (40 mg total) by mouth daily. 90 tablet 0  . Icosapent Ethyl 1 g CAPS Take 2 capsules (2  g total) by mouth 2 (two) times daily. 120 capsule 11  . labetalol (NORMODYNE) 200 MG tablet Take 2 tablets (400 mg total) by mouth 2 (two) times daily. 120 tablet 0  . rosuvastatin (CRESTOR) 10 MG tablet Take 10 mg by mouth daily.    . sodium zirconium cyclosilicate (LOKELMA) 10 g PACK packet Take 10 g by mouth.    . Vitamin D, Ergocalciferol, (DRISDOL) 1.25 MG (50000 UT) CAPS capsule Take 1 capsule (50,000 Units total) by mouth every 7 (seven) days. 4 capsule 6  . acetaminophen (TYLENOL) 500 MG tablet Take 1 tablet (500 mg total) by mouth every 6 (six) hours as needed for moderate pain. 30 tablet 0   No facility-administered medications prior to visit.     Allergies  Allergen Reactions  . Beef-Derived Products Other (See Comments)    Cultural preference  . Pegademase Bovine Other (See Comments)    Cultural preference  . Poractant Alfa Other (See Comments)    Cultural preference  . Pork-Derived Products Other (See Comments)    Cultural preference    ROS Review of Systems Review of Systems  Constitutional: Negative for activity change, appetite change, chills and fever.  HENT: Negative for congestion, nosebleeds, trouble swallowing and voice change.   Respiratory: Negative for cough, shortness of breath and wheezing.   Gastrointestinal: Negative for diarrhea, nausea and vomiting.  Genitourinary: Negative for difficulty urinating, dysuria, flank pain and hematuria.  Musculoskeletal: Negative for back pain, joint swelling and neck pain.  Neurological: Negative for dizziness, speech difficulty, light-headedness and numbness.  See HPI. All other review of systems negative.     Objective:    Physical Exam  BP (!) 169/85   Pulse 84   Temp 98.5 F (36.9 C) (Oral)   Resp 16   Ht 5\' 9"  (1.753 m) Comment: per patient  Wt 258 lb 3.2 oz (117.1 kg)   SpO2 99%   BMI 38.13 kg/m  Wt Readings from Last 3 Encounters:  12/10/18 258 lb 3.2 oz (117.1 kg)  11/28/18 257 lb 12.8 oz (116.9  kg)  11/27/18 258 lb (117 kg)   Physical Exam  Constitutional: Oriented to person, place, and time. Appears well-developed and well-nourished.  HENT:  Head: Normocephalic and atraumatic.  Eyes: Conjunctivae and EOM are normal.  Cardiovascular: Normal rate, regular rhythm, normal heart sounds and intact distal pulses.  No murmur heard. Pulmonary/Chest: Effort normal and breath sounds normal. No stridor. No respiratory distress. Has no wheezes.  Neurological: Is alert and oriented to person, place, and time.  Skin: Skin is warm. Capillary refill takes less than 2 seconds.  Psychiatric: Has a normal mood and affect. Behavior is normal. Judgment and thought content normal.   EXAM: CHEST - 2 VIEW  COMPARISON:  October 16, 2018  FINDINGS: Mild opacity is seen in the left base. Heart, hila, mediastinum, lungs, and pleura are normal.  IMPRESSION: Mild opacity seen in the left base may represent atelectasis or subtle/early infiltrate/pneumonia. Recommend clinical correlation and short-term follow-up imaging to ensure resolution.   Electronically Signed   By: Dorise Bullion III M.D   On: 11/26/2018 14:40  There are no preventive care reminders to display for this patient.  There are no preventive care reminders to display for this patient.  Lab Results  Component Value Date   TSH 1.125 08/25/2018   Lab Results  Component Value Date   WBC 4.2 10/16/2018   HGB 9.5 (L) 10/16/2018   HCT 29.6 (L) 10/16/2018   MCV 85 10/16/2018   PLT 178 10/16/2018   Lab Results  Component Value Date   NA 142 10/16/2018   K 5.2 10/16/2018   CO2 18 (L) 10/16/2018   GLUCOSE 110 (H) 10/16/2018   BUN 69 (H) 10/16/2018   CREATININE 5.43 (HH) 10/16/2018   BILITOT 0.3 10/16/2018   ALKPHOS 40 10/16/2018   AST 18 10/16/2018   ALT 15 10/16/2018   PROT 6.6 10/16/2018   ALBUMIN 4.4 10/16/2018   CALCIUM 8.4 (L) 10/16/2018   ANIONGAP 11 08/27/2018   GFR 54.07 (L) 05/06/2014   Lab Results   Component Value Date   CHOL 313 (H) 08/24/2018   Lab Results  Component Value Date   HDL 24 (L) 08/24/2018   Lab Results  Component Value Date   LDLCALC Comment 08/24/2018   Lab Results  Component Value Date   TRIG 1,340 (HH) 08/24/2018   Lab Results  Component Value Date   CHOLHDL 13.0 (H) 08/24/2018   Lab Results  Component Value Date   HGBA1C 5.4 10/11/2017      Assessment & Plan:   Problem List Items Addressed This Visit    None    Visit Diagnoses    Community acquired pneumonia of left lower lobe of lung    -  Primary    good improvement  No need for additional antibiotics  No orders of the defined types were placed in this encounter.   Follow-up: Return in about 3 months (around 03/12/2019).    Forrest Moron, MD

## 2018-12-12 ENCOUNTER — Other Ambulatory Visit: Payer: Self-pay | Admitting: *Deleted

## 2018-12-18 DIAGNOSIS — G4733 Obstructive sleep apnea (adult) (pediatric): Secondary | ICD-10-CM | POA: Diagnosis not present

## 2018-12-24 ENCOUNTER — Encounter: Payer: Self-pay | Admitting: *Deleted

## 2018-12-24 ENCOUNTER — Other Ambulatory Visit: Payer: Self-pay | Admitting: *Deleted

## 2018-12-24 DIAGNOSIS — M5136 Other intervertebral disc degeneration, lumbar region: Secondary | ICD-10-CM | POA: Diagnosis not present

## 2018-12-24 DIAGNOSIS — G894 Chronic pain syndrome: Secondary | ICD-10-CM | POA: Diagnosis not present

## 2018-12-25 ENCOUNTER — Inpatient Hospital Stay (HOSPITAL_COMMUNITY): Admission: RE | Admit: 2018-12-25 | Payer: BLUE CROSS/BLUE SHIELD | Source: Ambulatory Visit

## 2018-12-28 ENCOUNTER — Telehealth: Payer: Self-pay

## 2018-12-28 NOTE — Telephone Encounter (Signed)
Called pt. And left VM notifying patient of need to reschedule visit on 03/15/2019.

## 2018-12-31 ENCOUNTER — Telehealth: Payer: Self-pay | Admitting: Family Medicine

## 2018-12-31 NOTE — Telephone Encounter (Signed)
LVM to r/s appt on 03/15/2019 with Dr. Nolon Rod. Provider is only working a half day that day.

## 2019-01-02 ENCOUNTER — Other Ambulatory Visit (HOSPITAL_COMMUNITY)
Admission: RE | Admit: 2019-01-02 | Discharge: 2019-01-02 | Disposition: A | Discharge status: Home / Self Care | Payer: BLUE CROSS/BLUE SHIELD | Source: Ambulatory Visit | Attending: Vascular Surgery | Admitting: Vascular Surgery

## 2019-01-02 DIAGNOSIS — Z20828 Contact with and (suspected) exposure to other viral communicable diseases: Secondary | ICD-10-CM | POA: Diagnosis not present

## 2019-01-02 DIAGNOSIS — Z01812 Encounter for preprocedural laboratory examination: Secondary | ICD-10-CM | POA: Insufficient documentation

## 2019-01-02 LAB — SARS CORONAVIRUS 2 (TAT 6-24 HRS): SARS Coronavirus 2: NEGATIVE

## 2019-01-03 ENCOUNTER — Other Ambulatory Visit: Payer: Self-pay

## 2019-01-03 ENCOUNTER — Ambulatory Visit: Payer: BLUE CROSS/BLUE SHIELD | Admitting: Neurology

## 2019-01-03 ENCOUNTER — Encounter (HOSPITAL_COMMUNITY): Payer: Self-pay | Admitting: *Deleted

## 2019-01-03 MED ORDER — DEXTROSE 5 % IV SOLN
3.0000 g | INTRAVENOUS | Status: AC
  Administered 2019-01-04: 3 g via INTRAVENOUS
  Filled 2019-01-03: qty 3

## 2019-01-03 NOTE — Progress Notes (Signed)
Spoke with pt for pre-op call via [employer] # [phone]. Pt has hx of high cholesterol and sees Dr. Debara Pickett. Pt denies any other heart problems. Pt states he was diagnosed with pneumonia in October. Still has a cough, sputum production is very little now per pt.   Pt had lots of questions about this procedure.  Pt had his Covid test done on 01/12/19 and it's negative. He states he's been in quarantine since the test was done.   I informed pt that only 1 visitor is allowed to come into the hospital with him. He voiced understanding.

## 2019-01-04 ENCOUNTER — Ambulatory Visit (HOSPITAL_COMMUNITY)
Admission: RE | Admit: 2019-01-04 | Discharge: 2019-01-04 | Disposition: A | Discharge status: Home / Self Care | Payer: BLUE CROSS/BLUE SHIELD | Attending: Vascular Surgery | Admitting: Vascular Surgery

## 2019-01-04 ENCOUNTER — Ambulatory Visit (HOSPITAL_COMMUNITY): Payer: BLUE CROSS/BLUE SHIELD | Admitting: Certified Registered Nurse Anesthetist

## 2019-01-04 ENCOUNTER — Other Ambulatory Visit: Payer: Self-pay

## 2019-01-04 ENCOUNTER — Encounter (HOSPITAL_COMMUNITY): Payer: Self-pay | Admitting: Certified Registered Nurse Anesthetist

## 2019-01-04 ENCOUNTER — Encounter (HOSPITAL_COMMUNITY)
Admission: RE | Disposition: A | Discharge status: Home / Self Care | Payer: Self-pay | Source: Home / Self Care | Attending: Vascular Surgery

## 2019-01-04 DIAGNOSIS — I129 Hypertensive chronic kidney disease with stage 1 through stage 4 chronic kidney disease, or unspecified chronic kidney disease: Secondary | ICD-10-CM | POA: Diagnosis not present

## 2019-01-04 DIAGNOSIS — G4730 Sleep apnea, unspecified: Secondary | ICD-10-CM | POA: Insufficient documentation

## 2019-01-04 DIAGNOSIS — Z79899 Other long term (current) drug therapy: Secondary | ICD-10-CM | POA: Diagnosis not present

## 2019-01-04 DIAGNOSIS — E7800 Pure hypercholesterolemia, unspecified: Secondary | ICD-10-CM | POA: Diagnosis not present

## 2019-01-04 DIAGNOSIS — E785 Hyperlipidemia, unspecified: Secondary | ICD-10-CM | POA: Insufficient documentation

## 2019-01-04 DIAGNOSIS — N184 Chronic kidney disease, stage 4 (severe): Secondary | ICD-10-CM | POA: Insufficient documentation

## 2019-01-04 DIAGNOSIS — Z905 Acquired absence of kidney: Secondary | ICD-10-CM | POA: Diagnosis not present

## 2019-01-04 DIAGNOSIS — D631 Anemia in chronic kidney disease: Secondary | ICD-10-CM | POA: Diagnosis not present

## 2019-01-04 HISTORY — DX: Pneumonia, unspecified organism: J18.9

## 2019-01-04 HISTORY — PX: AV FISTULA PLACEMENT: SHX1204

## 2019-01-04 LAB — POCT I-STAT, CHEM 8
BUN: 111 mg/dL — ABNORMAL HIGH (ref 6–20)
Calcium, Ion: 0.9 mmol/L — ABNORMAL LOW (ref 1.15–1.40)
Chloride: 111 mmol/L (ref 98–111)
Creatinine, Ser: 8.5 mg/dL — ABNORMAL HIGH (ref 0.61–1.24)
Glucose, Bld: 98 mg/dL (ref 70–99)
HCT: 27 % — ABNORMAL LOW (ref 39.0–52.0)
Hemoglobin: 9.2 g/dL — ABNORMAL LOW (ref 13.0–17.0)
Potassium: 5.8 mmol/L — ABNORMAL HIGH (ref 3.5–5.1)
Sodium: 136 mmol/L (ref 135–145)
TCO2: 15 mmol/L — ABNORMAL LOW (ref 22–32)

## 2019-01-04 SURGERY — ARTERIOVENOUS (AV) FISTULA CREATION
Anesthesia: Monitor Anesthesia Care | Site: Arm Lower | Laterality: Left

## 2019-01-04 MED ORDER — CHLORHEXIDINE GLUCONATE 4 % EX LIQD: 60.0000 mL | Freq: Once | CUTANEOUS | Status: DC

## 2019-01-04 MED ORDER — PROPOFOL 10 MG/ML IV BOLUS
INTRAVENOUS | Status: DC | PRN
  Administered 2019-01-04: 20 mg via INTRAVENOUS
  Administered 2019-01-04: 200 mg via INTRAVENOUS

## 2019-01-04 MED ORDER — 0.9 % SODIUM CHLORIDE (POUR BTL) OPTIME
TOPICAL | Status: DC | PRN
  Administered 2019-01-04: 1000 mL

## 2019-01-04 MED ORDER — SODIUM CHLORIDE 0.9 % IV SOLN
INTRAVENOUS | Status: AC
  Filled 2019-01-04: qty 1.2

## 2019-01-04 MED ORDER — LIDOCAINE 2% (20 MG/ML) 5 ML SYRINGE
INTRAMUSCULAR | Status: DC | PRN
  Administered 2019-01-04: 100 mg via INTRAVENOUS

## 2019-01-04 MED ORDER — HYDROCODONE-ACETAMINOPHEN 5-325 MG PO TABS
ORAL_TABLET | ORAL | Status: AC
  Filled 2019-01-04: qty 1

## 2019-01-04 MED ORDER — FENTANYL CITRATE (PF) 250 MCG/5ML IJ SOLN
INTRAMUSCULAR | Status: DC | PRN
  Administered 2019-01-04: 100 ug via INTRAVENOUS

## 2019-01-04 MED ORDER — DEXMEDETOMIDINE HCL 200 MCG/2ML IV SOLN
INTRAVENOUS | Status: DC | PRN
  Administered 2019-01-04: 8 ug via INTRAVENOUS

## 2019-01-04 MED ORDER — PHENYLEPHRINE 40 MCG/ML (10ML) SYRINGE FOR IV PUSH (FOR BLOOD PRESSURE SUPPORT)
PREFILLED_SYRINGE | INTRAVENOUS | Status: DC | PRN
  Administered 2019-01-04 (×5): 80 ug via INTRAVENOUS

## 2019-01-04 MED ORDER — HEPARIN SODIUM (PORCINE) 1000 UNIT/ML IJ SOLN
INTRAMUSCULAR | Status: DC | PRN
  Administered 2019-01-04: 3000 [IU] via INTRAVENOUS

## 2019-01-04 MED ORDER — HYDROCODONE-ACETAMINOPHEN 5-325 MG PO TABS: 1.0000 | ORAL_TABLET | Freq: Four times a day (QID) | ORAL | 0 refills | Status: DC | PRN

## 2019-01-04 MED ORDER — SUGAMMADEX SODIUM 500 MG/5ML IV SOLN
INTRAVENOUS | Status: AC
  Filled 2019-01-04: qty 5

## 2019-01-04 MED ORDER — DEXAMETHASONE SODIUM PHOSPHATE 10 MG/ML IJ SOLN
INTRAMUSCULAR | Status: AC
  Filled 2019-01-04: qty 1

## 2019-01-04 MED ORDER — ONDANSETRON HCL 4 MG/2ML IJ SOLN
INTRAMUSCULAR | Status: DC | PRN
  Administered 2019-01-04: 4 mg via INTRAVENOUS

## 2019-01-04 MED ORDER — ONDANSETRON HCL 4 MG/2ML IJ SOLN
INTRAMUSCULAR | Status: AC
  Filled 2019-01-04: qty 2

## 2019-01-04 MED ORDER — SODIUM CHLORIDE 0.9 % IV SOLN
INTRAVENOUS | Status: DC
  Administered 2019-01-04 (×4): via INTRAVENOUS

## 2019-01-04 MED ORDER — MIDAZOLAM HCL 2 MG/2ML IJ SOLN
INTRAMUSCULAR | Status: AC
  Filled 2019-01-04: qty 2

## 2019-01-04 MED ORDER — LIDOCAINE HCL (PF) 1 % IJ SOLN
INTRAMUSCULAR | Status: AC
  Filled 2019-01-04: qty 30

## 2019-01-04 MED ORDER — SUCCINYLCHOLINE CHLORIDE 200 MG/10ML IV SOSY: PREFILLED_SYRINGE | INTRAVENOUS | Status: DC | PRN

## 2019-01-04 MED ORDER — FENTANYL CITRATE (PF) 250 MCG/5ML IJ SOLN
INTRAMUSCULAR | Status: AC
  Filled 2019-01-04: qty 5

## 2019-01-04 MED ORDER — ONDANSETRON HCL 4 MG/2ML IJ SOLN: 4.0000 mg | Freq: Once | INTRAMUSCULAR | Status: DC | PRN

## 2019-01-04 MED ORDER — OXYCODONE HCL 5 MG PO TABS: 5.0000 mg | ORAL_TABLET | Freq: Once | ORAL | Status: DC | PRN

## 2019-01-04 MED ORDER — MIDAZOLAM HCL 2 MG/2ML IJ SOLN
INTRAMUSCULAR | Status: DC | PRN
  Administered 2019-01-04: 1 mg via INTRAVENOUS

## 2019-01-04 MED ORDER — LIDOCAINE 2% (20 MG/ML) 5 ML SYRINGE
INTRAMUSCULAR | Status: AC
  Filled 2019-01-04: qty 5

## 2019-01-04 MED ORDER — FENTANYL CITRATE (PF) 100 MCG/2ML IJ SOLN
25.0000 ug | INTRAMUSCULAR | Status: DC | PRN
  Administered 2019-01-04 (×2): 25 ug via INTRAVENOUS

## 2019-01-04 MED ORDER — SUGAMMADEX SODIUM 500 MG/5ML IV SOLN
INTRAVENOUS | Status: DC | PRN
  Administered 2019-01-04: 400 mg via INTRAVENOUS

## 2019-01-04 MED ORDER — HEPARIN SODIUM (PORCINE) 1000 UNIT/ML IJ SOLN
INTRAMUSCULAR | Status: AC
  Filled 2019-01-04: qty 1

## 2019-01-04 MED ORDER — PROPOFOL 10 MG/ML IV BOLUS
INTRAVENOUS | Status: AC
  Filled 2019-01-04: qty 20

## 2019-01-04 MED ORDER — ROCURONIUM BROMIDE 10 MG/ML (PF) SYRINGE
PREFILLED_SYRINGE | INTRAVENOUS | Status: DC | PRN
  Administered 2019-01-04: 100 mg via INTRAVENOUS

## 2019-01-04 MED ORDER — FENTANYL CITRATE (PF) 100 MCG/2ML IJ SOLN
INTRAMUSCULAR | Status: AC
  Filled 2019-01-04: qty 2

## 2019-01-04 MED ORDER — OXYCODONE HCL 5 MG/5ML PO SOLN: 5.0000 mg | Freq: Once | ORAL | Status: DC | PRN

## 2019-01-04 MED ORDER — HYDROCODONE-ACETAMINOPHEN 5-325 MG PO TABS
1.0000 | ORAL_TABLET | Freq: Once | ORAL | Status: AC
  Administered 2019-01-04: 1 via ORAL

## 2019-01-04 MED ORDER — SODIUM CHLORIDE 0.9 % IV SOLN
INTRAVENOUS | Status: DC | PRN
  Administered 2019-01-04: 500 mL

## 2019-01-04 MED ORDER — PHENYLEPHRINE HCL-NACL 10-0.9 MG/250ML-% IV SOLN
INTRAVENOUS | Status: DC | PRN
  Administered 2019-01-04: 30 ug/min via INTRAVENOUS

## 2019-01-04 MED ORDER — DEXMEDETOMIDINE HCL IN NACL 200 MCG/50ML IV SOLN
INTRAVENOUS | Status: AC
  Filled 2019-01-04: qty 50

## 2019-01-04 MED ORDER — DEXAMETHASONE SODIUM PHOSPHATE 10 MG/ML IJ SOLN
INTRAMUSCULAR | Status: DC | PRN
  Administered 2019-01-04: 5 mg via INTRAVENOUS

## 2019-01-04 SURGICAL SUPPLY — 38 items
ADH SKN CLS APL DERMABOND .7 (GAUZE/BANDAGES/DRESSINGS) ×1
AGENT HMST SPONGE THK3/8 (HEMOSTASIS)
ARMBAND PINK RESTRICT EXTREMIT (MISCELLANEOUS) ×3 IMPLANT
CANISTER SUCT 3000ML PPV (MISCELLANEOUS) ×2 IMPLANT
CLIP VESOCCLUDE MED 6/CT (CLIP) ×2 IMPLANT
CLIP VESOCCLUDE SM WIDE 6/CT (CLIP) ×2 IMPLANT
COVER PROBE W GEL 5X96 (DRAPES) ×2 IMPLANT
COVER WAND RF STERILE (DRAPES) ×1 IMPLANT
DECANTER SPIKE VIAL GLASS SM (MISCELLANEOUS) ×1 IMPLANT
DERMABOND ADVANCED (GAUZE/BANDAGES/DRESSINGS) ×1
DERMABOND ADVANCED .7 DNX12 (GAUZE/BANDAGES/DRESSINGS) ×1 IMPLANT
ELECT REM PT RETURN 9FT ADLT (ELECTROSURGICAL) ×2
ELECTRODE REM PT RTRN 9FT ADLT (ELECTROSURGICAL) ×1 IMPLANT
GLOVE BIO SURGEON STRL SZ7.5 (GLOVE) ×2 IMPLANT
GLOVE BIOGEL PI IND STRL 6.5 (GLOVE) IMPLANT
GLOVE BIOGEL PI IND STRL 8 (GLOVE) ×1 IMPLANT
GLOVE BIOGEL PI INDICATOR 6.5 (GLOVE) ×1
GLOVE BIOGEL PI INDICATOR 8 (GLOVE) ×1
GLOVE SURG SS PI 6.5 STRL IVOR (GLOVE) ×2 IMPLANT
GOWN STRL NON-REIN LRG LVL3 (GOWN DISPOSABLE) ×1 IMPLANT
GOWN STRL REUS W/ TWL LRG LVL3 (GOWN DISPOSABLE) ×2 IMPLANT
GOWN STRL REUS W/ TWL XL LVL3 (GOWN DISPOSABLE) ×2 IMPLANT
GOWN STRL REUS W/TWL LRG LVL3 (GOWN DISPOSABLE) ×4
GOWN STRL REUS W/TWL XL LVL3 (GOWN DISPOSABLE) ×4
HEMOSTAT SPONGE AVITENE ULTRA (HEMOSTASIS) IMPLANT
KIT BASIN OR (CUSTOM PROCEDURE TRAY) ×2 IMPLANT
KIT TURNOVER KIT B (KITS) ×2 IMPLANT
NS IRRIG 1000ML POUR BTL (IV SOLUTION) ×2 IMPLANT
PACK CV ACCESS (CUSTOM PROCEDURE TRAY) ×2 IMPLANT
PAD ARMBOARD 7.5X6 YLW CONV (MISCELLANEOUS) ×4 IMPLANT
SUT MNCRL AB 4-0 PS2 18 (SUTURE) ×2 IMPLANT
SUT PROLENE 6 0 BV (SUTURE) ×2 IMPLANT
SUT PROLENE 7 0 BV 1 (SUTURE) IMPLANT
SUT VIC AB 3-0 SH 27 (SUTURE) ×2
SUT VIC AB 3-0 SH 27X BRD (SUTURE) ×1 IMPLANT
TOWEL GREEN STERILE (TOWEL DISPOSABLE) ×2 IMPLANT
UNDERPAD 30X30 (UNDERPADS AND DIAPERS) ×2 IMPLANT
WATER STERILE IRR 1000ML POUR (IV SOLUTION) ×2 IMPLANT

## 2019-01-04 NOTE — Progress Notes (Signed)
Spoke to Dr. Christella Hartigan regarding K+5.8, BP 172/100.  No new orders received.

## 2019-01-04 NOTE — Anesthesia Postprocedure Evaluation (Signed)
Anesthesia Post Note  Patient: [name]  Procedure(s) Performed: ARTERIOVENOUS (AV) FISTULA CREATION LEFT ARM (Left Arm Lower)     Patient location during evaluation: PACU Anesthesia Type: General Level of consciousness: awake and alert Pain management: pain level controlled Vital Signs Assessment: post-procedure vital signs reviewed and stable Respiratory status: spontaneous breathing, nonlabored ventilation and respiratory function stable Cardiovascular status: blood pressure returned to baseline and stable Postop Assessment: no apparent nausea or vomiting Anesthetic complications: no    Last Vitals:  Vitals:   01/04/19 1345 01/04/19 1400  BP: 135/80 138/76  Pulse: 81 81  Resp: 16 15  Temp:  36.8 C  SpO2: 96% 96%    Last Pain:  Vitals:   01/04/19 1400  TempSrc:   PainSc: 4                  Lidia Collum

## 2019-01-04 NOTE — H&P (Signed)
History and Physical Interval Note:  01/04/2019 10:49 AM  [name]  has presented today for surgery, with the diagnosis of CHRONIC KIDNEY DISEASE STAGE 4.  The various methods of treatment have been discussed with the patient and family. After consideration of risks, benefits and other options for treatment, the patient has consented to  Procedure(s): ARTERIOVENOUS (AV) FISTULA CREATION LEFT ARM (Left) as a surgical intervention.  The patient's history has been reviewed, patient examined, no change in status, stable for surgery.  I have reviewed the patient's chart and labs.  Questions were answered to the patient's satisfaction.    Left arm AVF.  [name]  Patient name: Sayf Kerner     MRN: [id number]        DOB: [birth year]            Sex: male  REASON FOR CONSULT: Evaluate for new dialysis access  HPI: Erian Rosengren is a 53 y.o. male, with chronic kidney disease that presents for evaluation of new dialysis access.  Per nephrology notes he had a nephrectomy in 2013 for hydronephrosis as well as underlying blood pressure issues.  Ultimately he has been followed by nephrology and his creatinine has continued to rise with a solitary kidney.  Patient reports that he is right-handed.  He still working.  No previous dialysis access in the past.  Does not have a catheter.  No chest wall implants.  No numbness or tingling in his hand.      Past Medical History:  Diagnosis Date  . Anemia   . Back pain   . CKD (chronic kidney disease), stage III   . Colon polyps    adenomatous  . Elevated cholesterol   . History of echocardiogram    Echo 11/17: EF 65-70, normal wall motion, grade 1 diastolic dysfunction, trivial MR, mild LAE, mild TR  . Hyperlipidemia   . Hypertension    no current bp meds for last 3 months  . Kidney stones 2007  . Medication intolerance    a. multiple with prior nonadherence to regimen.  . Otosclerosis of both ears   . PUD (peptic ulcer disease)     Has had unspecified surgery for this  . Sleep apnea          Past Surgical History:  Procedure Laterality Date  . NEPHRECTOMY  02/18/2011   Procedure: NEPHRECTOMY;  Surgeon: Hanley Ben, MD;  Location: [facility];  Service: Urology;  Laterality: Right;  . SMALL INTESTINE SURGERY    . STAPEDOTOMY  2005   lt ear jan, right ear sept  . surgery for ulcers  1990         Family History  Problem Relation Age of Onset  . Hyperlipidemia Father   . Heart disease Mother   . Hypertension Mother   . Hypertension Sister   . Heart disease Brother 44       CABG  . Hyperlipidemia Sister   . Hyperlipidemia Sister   . Esophageal cancer Cousin   . Colon cancer Neg Hx   . Stomach cancer Neg Hx   . Rectal cancer Neg Hx     SOCIAL HISTORY: Social History        Socioeconomic History  . Marital status: Married    Spouse name: Edgardo Roys  . Number of children: 5  . Years of education: Not on file  . Highest education level: 12th grade  Occupational History    Employer: [employer]  Social Needs  . Emergency planning/management officer  strain: Not hard at all  . Food insecurity    Worry: Never true    Inability: Never true  . Transportation needs    Medical: No    Non-medical: No  Tobacco Use  . Smoking status: Never Smoker  . Smokeless tobacco: Never Used  Substance and Sexual Activity  . Alcohol use: No  . Drug use: No  . Sexual activity: Yes    Birth control/protection: None  Lifestyle  . Physical activity    Days per week: 0 days    Minutes per session: 0 min  . Stress: Not at all  Relationships  . Social connections    Talks on phone: More than three times a week    Gets together: More than three times a week    Attends religious service: More than 4 times per year    Active member of club or organization: No    Attends meetings of clubs or organizations: Never    Relationship status: Married  . Intimate partner violence     Fear of current or ex partner: No    Emotionally abused: No    Physically abused: No    Forced sexual activity: No  Other Topics Concern  . Not on file  Social History Narrative   Lives at home with wife and family. He is from [place]. Came to the [place] in 2002.      Patient is right-handed. He lives in a one level home. He drinks 1-2 cups of coffee and tea a day. He does not exercise.         Allergies  Allergen Reactions  . Beef-Derived Products Other (See Comments)    Cultural preference  . Pegademase Bovine Other (See Comments)    Cultural preference  . Poractant Alfa Other (See Comments)    Cultural preference  . Pork-Derived Products Other (See Comments)    Cultural preference          Current Outpatient Medications  Medication Sig Dispense Refill  . acetaminophen (TYLENOL) 500 MG tablet Take 1 tablet (500 mg total) by mouth every 6 (six) hours as needed for moderate pain. 30 tablet 0  . azithromycin (ZITHROMAX) 250 MG tablet Take 2 tablets today, then one tablet each day after. 6 tablet 0  . calcitRIOL (ROCALTROL) 0.25 MCG capsule Take 0.25 mcg by mouth daily.  11  . calcium acetate (PHOSLO) 667 MG capsule Take 2 capsules (1,334 mg total) by mouth 3 (three) times daily with meals. 180 capsule 3  . cholecalciferol (VITAMIN D) 25 MCG (1000 UT) tablet Take 1,000 Units by mouth daily.    . furosemide (LASIX) 40 MG tablet Take 1 tablet (40 mg total) by mouth daily. 90 tablet 0  . Icosapent Ethyl 1 g CAPS Take 2 capsules (2 g total) by mouth 2 (two) times daily. 120 capsule 11  . labetalol (NORMODYNE) 200 MG tablet Take 2 tablets (400 mg total) by mouth 2 (two) times daily. 120 tablet 0  . rosuvastatin (CRESTOR) 10 MG tablet Take 10 mg by mouth daily.    . Vitamin D, Ergocalciferol, (DRISDOL) 1.25 MG (50000 UT) CAPS capsule Take 1 capsule (50,000 Units total) by mouth every 7 (seven) days. 4 capsule 6   No current facility-administered medications for  this visit.     REVIEW OF SYSTEMS:  [X]  denotes positive finding, [ ]  denotes negative finding Cardiac  Comments:  Chest pain or chest pressure:    Shortness of breath upon exertion:  Short of breath when lying flat:    Irregular heart rhythm:        Vascular    Pain in calf, thigh, or hip brought on by ambulation:    Pain in feet at night that wakes you up from your sleep:     Blood clot in your veins:    Leg swelling:         Pulmonary    Oxygen at home: x   Productive cough:     Wheezing:         Neurologic    Sudden weakness in arms or legs:     Sudden numbness in arms or legs:     Sudden onset of difficulty speaking or slurred speech:    Temporary loss of vision in one eye:     Problems with dizziness:         Gastrointestinal    Blood in stool:     Vomited blood:         Genitourinary    Burning when urinating:     Blood in urine:        Psychiatric    Major depression:         Hematologic    Bleeding problems:    Problems with blood clotting too easily:        Skin    Rashes or ulcers:        Constitutional    Fever or chills:      PHYSICAL EXAM:    Vitals:   11/27/18 1217  BP: 134/80  Pulse: 79  Resp: 18  Temp: 98.7 F (37.1 C)  TempSrc: Temporal  SpO2: 98%  Weight: 258 lb (117 kg)  Height: 5\' 9"  (1.753 m)    GENERAL: The patient is a well-nourished male, in no acute distress. The vital signs are documented above. CARDIAC: There is a regular rate and rhythm.  VASCULAR:  2+ radial pulse palpable bilateral upper extremity 2+ brachial pulse palpable bilateral upper extremity No upper extremity tissue loss PULMONARY: There is good air exchange bilaterally without wheezing or rales. ABDOMEN: Soft and non-tender with normal pitched bowel sounds.  MUSCULOSKELETAL: There are no major deformities or cyanosis. NEUROLOGIC: No focal weakness or  paresthesias are detected. SKIN: There are no ulcers or rashes noted. PSYCHIATRIC: The patient has a normal affect.  DATA:   Arterial duplex shows triphasic waveforms in both brachial arteries with biphasic waveforms at the wrists.  Vein mapping shows a small cephalic vein on the left but a nice basilic vein that would be an option for fistula placement given he is right-handed.  Assessment/Plan:  53 year old male with chronic kidney disease in the setting of hypertension and solitary kidney given previous nephrectomy that presents for permanent dialysis access evaluation.  Given the patient is right-handed discussed plans for left arm AV fistula placement.  His cephalic vein is small in the left arm, but he does have an option with his basilic vein potentially could be at either wrist or higher on the arm.  Did discuss this is often done in two stages if we have to do a brachiobasilic.  Patient seems pretty unclear on his clinical course as it relates to dialysis needs.  Discussed that fistula takes 2 to 3 months to mature and then he would require hemodialysis on average of 3 times a week.  This seem to be a surprise to him given that he wants to work.  He wants to discuss with his kidney  doctor before scheduling today.  He will call back and contact our office when he is ready and we will plan for left arm AV fistula placement in the near future when he is ready to get on the OR schedule.   [name], MD [facility] Office: [phone] Pager: [phone]

## 2019-01-04 NOTE — Op Note (Signed)
OPERATIVE NOTE   PROCEDURE: 1. left first stage basilic vein transposition (brachiobasilic arteriovenous fistula) placement  PRE-OPERATIVE DIAGNOSIS: Stage IV CKD  POST-OPERATIVE DIAGNOSIS: Same  SURGEON: [name], MD  ASSISTANT(S): OR Staff  ANESTHESIA: general  ESTIMATED BLOOD LOSS: Minimal  FINDING(S): 1.  Basilic vein: 4-5 mm, acceptable 2.  Brachial artery: 4 mm, atherosclerotic disease evident 3.  Venous outflow: palpable thrill  4.  Radial flow: dopplerable radial signal  SPECIMEN(S):  none  INDICATIONS:   [name] is a 53 y.o. male who presents with stage IV CKD and needs permanent hemodialysis access.  The patient is scheduled for left first stage basilic vein transposition.  The patient is aware the risks include but are not limited to: bleeding, infection, steal syndrome, nerve damage, ischemic monomelic neuropathy, failure to mature, and need for additional procedures.  The patient is aware of the risks of the procedure and elects to proceed forward.   DESCRIPTION: After full informed written consent was obtained from the patient, the patient was brought back to the operating room and placed supine upon the operating table.  Prior to induction, the patient received IV antibiotics.   After obtaining adequate anesthesia, the patient was then prepped and draped in the standard fashion for a left arm access procedure.  I turned my attention first to identifying the patient's basilic vein and brachial artery.    Using SonoSite guidance, the location of these vessels were marked out on the skin.   At this point, I injected local anesthetic to obtain a field block of the antecubitum.  In total, I injected about 6 mL of 1% lidocaine without epinephrine.  I made a transverse incision below the level of the antecubitum where the vein was still robust and it would give [place] extra length for transposition.  I dissected through the subcutaneous tissue and fascia to  gain exposure of the brachial artery.  This was noted to be 4 mm in diameter externally.  This was dissected out proximally and distally and controlled with vessel loops .  I then dissected out the basilic vein.  This was noted to be 4-5 mm in diameter externally.  The distal segment of the vein was ligated with a  2-0 silk, and the vein was transected.  The proximal segment was interrogated with serial dilators.  I then instilled the heparinized saline into the vein and clamped it.  At this point, I reset my exposure of the brachial artery.  The patient was given 3,000 units IV heparin.  I placed the artery under tension proximally and distally.  I made an arteriotomy with a #11 blade, and then I extended the arteriotomy with a Potts scissor.  I injected heparinized saline proximal and distal to this arteriotomy.  The vein was then sewn to the artery in an end-to-side configuration with a running stitch of 6-0 Prolene.  Prior to completing this anastomosis, I allowed the vein and artery to backbleed.  There was no evidence of clot from any vessels.  I completed the anastomosis in the usual fashion and then released all vessel loops and clamps.    There was a palpable thrill in the venous outflow, and there was a dopplerable radial signal.  At this point, I irrigated out the surgical wound.  There was no further active bleeding.  The subcutaneous tissue was reapproximated with a running stitch of 3-0 Vicryl.  The skin was then reapproximated with a running subcuticular stitch of 4-0 Monocryl.  The skin was then cleaned, dried, and reinforced with Dermabond.  The patient tolerated this procedure well.   COMPLICATIONS: None  CONDITION: Stable   [name] MD [facility] Office: [phone] Pager: [phone]  01/04/2019, 12:38 PM

## 2019-01-04 NOTE — Transfer of Care (Signed)
Immediate Anesthesia Transfer of Care Note  Patient: [name]  Procedure(s) Performed: ARTERIOVENOUS (AV) FISTULA CREATION LEFT ARM (Left Arm Lower)  Patient Location: PACU  Anesthesia Type:General  Level of Consciousness: awake and alert   Airway & Oxygen Therapy: Patient Spontanous Breathing  Post-op Assessment: Report given to RN, Post -op Vital signs reviewed and stable and Patient moving all extremities X 4  Post vital signs: Reviewed and stable  Last Vitals:  Vitals Value Taken Time  BP 136/72 01/04/19 1300  Temp 36.7 C 01/04/19 1300  Pulse 82 01/04/19 1304  Resp 21 01/04/19 1304  SpO2 94 % 01/04/19 1304  Vitals shown include unvalidated device data.  Last Pain:  Vitals:   01/04/19 1300  TempSrc:   PainSc: Asleep         Complications: No apparent anesthesia complications

## 2019-01-04 NOTE — Discharge Instructions (Signed)
General Anesthesia, Adult, Care After °This sheet gives you information about how to care for yourself after your procedure. Your health care provider may also give you more specific instructions. If you have problems or questions, contact your health care provider. °What can I expect after the procedure? °After the procedure, the following side effects are common: °· Pain or discomfort at the IV site. °· Nausea. °· Vomiting. °· Sore throat. °· Trouble concentrating. °· Feeling cold or chills. °· Weak or tired. °· Sleepiness and fatigue. °· Soreness and body aches. These side effects can affect parts of the body that were not involved in surgery. °Follow these instructions at home: ° °For at least 24 hours after the procedure: °· Have a responsible adult stay with you. It is important to have someone help care for you until you are awake and alert. °· Rest as needed. °· Do not: °? Participate in activities in which you could fall or become injured. °? Drive. °? Use heavy machinery. °? Drink alcohol. °? Take sleeping pills or medicines that cause drowsiness. °? Make important decisions or sign legal documents. °? Take care of children on your own. °Eating and drinking °· Follow any instructions from your health care provider about eating or drinking restrictions. °· When you feel hungry, start by eating small amounts of foods that are soft and easy to digest (bland), such as toast. Gradually return to your regular diet. °· Drink enough fluid to keep your urine pale yellow. °· If you vomit, rehydrate by drinking water, juice, or clear broth. °General instructions °· If you have sleep apnea, surgery and certain medicines can increase your risk for breathing problems. Follow instructions from your health care provider about wearing your sleep device: °? Anytime you are sleeping, including during daytime naps. °? While taking prescription pain medicines, sleeping medicines, or medicines that make you drowsy. °· Return to  your normal activities as told by your health care provider. Ask your health care provider what activities are safe for you. °· Take over-the-counter and prescription medicines only as told by your health care provider. °· If you smoke, do not smoke without supervision. °· Keep all follow-up visits as told by your health care provider. This is important. °Contact a health care provider if: °· You have nausea or vomiting that does not get better with medicine. °· You cannot eat or drink without vomiting. °· You have pain that does not get better with medicine. °· You are unable to pass urine. °· You develop a skin rash. °· You have a fever. °· You have redness around your IV site that gets worse. °Get help right away if: °· You have difficulty breathing. °· You have chest pain. °· You have blood in your urine or stool, or you vomit blood. °Summary °· After the procedure, it is common to have a sore throat or nausea. It is also common to feel tired. °· Have a responsible adult stay with you for the first 24 hours after general anesthesia. It is important to have someone help care for you until you are awake and alert. °· When you feel hungry, start by eating small amounts of foods that are soft and easy to digest (bland), such as toast. Gradually return to your regular diet. °· Drink enough fluid to keep your urine pale yellow. °· Return to your normal activities as told by your health care provider. Ask your health care provider what activities are safe for you. °This information is not   intended to replace advice given to you by your health care provider. Make sure you discuss any questions you have with your health care provider. Document Released: 05/09/2000 Document Revised: 02/03/2017 Document Reviewed: 09/16/2016 Elsevier Patient Education  2020 [employer].   [facility]  Discharge Instructions  AV Fistula or Graft Surgery for Dialysis Access  Please refer to the  following instructions for your post-procedure care. Your surgeon or physician assistant will discuss any changes with you.  Activity  You may drive the day following your surgery, if you are comfortable and no longer taking prescription pain medication. Resume full activity as the soreness in your incision resolves.  Bathing/Showering  You may shower after you go home. Keep your incision dry for 48 hours. Do not soak in a bathtub, hot tub, or swim until the incision heals completely. You may not shower if you have a hemodialysis catheter.  Incision Care  Clean your incision with mild soap and water after 48 hours. Pat the area dry with a clean towel. You do not need a bandage unless otherwise instructed. Do not apply any ointments or creams to your incision. You may have skin glue on your incision. Do not peel it off. It will come off on its own in about one week. Your arm may swell a bit after surgery. To reduce swelling use pillows to elevate your arm so it is above your heart. Your doctor will tell you if you need to lightly wrap your arm with an ACE bandage.  Diet  Resume your normal diet. There are not special food restrictions following this procedure. In order to heal from your surgery, it is CRITICAL to get adequate nutrition. Your body requires vitamins, minerals, and protein. Vegetables are the best source of vitamins and minerals. Vegetables also provide the perfect balance of protein. Processed food has little nutritional value, so try to avoid this.  Medications  Resume taking all of your medications. If your incision is causing pain, you may take over-the counter pain relievers such as acetaminophen (Tylenol). If you were prescribed a stronger pain medication, please be aware these medications can cause nausea and constipation. Prevent nausea by taking the medication with a snack or meal. Avoid constipation by drinking plenty of fluids and eating foods with high amount of fiber,  such as fruits, vegetables, and grains.  Do not take Tylenol if you are taking prescription pain medications.  Follow up Your surgeon may want to see you in the office following your access surgery. If so, this will be arranged at the time of your surgery.  Please call us immediately for any of the following conditions:  Increased pain, redness, drainage (pus) from your incision site Fever of 101 degrees or higher Severe or worsening pain at your incision site Hand pain or numbness.  Reduce your risk of vascular disease:  Stop smoking. If you would like help, call [facility] at 1-800-QUIT-NOW (662) 221-5745) or [place] [facility] [place] your cholesterol Maintain a desired weight Control your diabetes Keep your blood pressure down  Dialysis  It will take several weeks to several months for your new dialysis access to be ready for use. Your surgeon will determine when it is okay to use it. Your nephrologist will continue to direct your dialysis. You can continue to use your Permcath until your new access is ready for use.   01/04/2019 [name] [id number] 05/30/1965  Surgeon(s): [name], MD  Procedure(s): ARTERIOVENOUS (AV) FISTULA  CREATION LEFT ARM   May stick graft immediately   May stick graft on designated area only:   x Do not stick    If you have any questions, please call the office at [phone].

## 2019-01-04 NOTE — Anesthesia Preprocedure Evaluation (Addendum)
Anesthesia Evaluation  Patient identified by MRN, date of birth, ID band Patient awake    Reviewed: Allergy & Precautions, NPO status , Patient's Chart, lab work & pertinent test results, reviewed documented beta blocker date and time   History of Anesthesia Complications Negative for: history of anesthetic complications  Airway Mallampati: III  TM Distance: >3 FB Neck ROM: Full    Dental  (+) Teeth Intact, Dental Advisory Given   Pulmonary sleep apnea ,    Pulmonary exam normal        Cardiovascular hypertension, Pt. on medications and Pt. on home beta blockers Normal cardiovascular exam     Neuro/Psych negative neurological ROS  negative psych ROS   GI/Hepatic Neg liver ROS, PUD,   Endo/Other  negative endocrine ROS  Renal/GU Renal InsufficiencyRenal disease  negative genitourinary   Musculoskeletal negative musculoskeletal ROS (+)   Abdominal   Peds  Hematology  (+) anemia ,   Anesthesia Other Findings Echo 08/26/18: EF 60-65%, mild ascending aorta dilatation, unremarkable valves  normal myoview 2015  Reproductive/Obstetrics                           Anesthesia Physical Anesthesia Plan  ASA: III  Anesthesia Plan: General   Post-op Pain Management:    Induction: Intravenous  PONV Risk Score and Plan: 2 and Treatment may vary due to age or medical condition, Ondansetron, Dexamethasone and Midazolam  Airway Management Planned: LMA  Additional Equipment: None  Intra-op Plan:   Post-operative Plan: Extubation in OR  Informed Consent: I have reviewed the patients History and Physical, chart, labs and discussed the procedure including the risks, benefits and alternatives for the proposed anesthesia with the patient or authorized representative who has indicated his/her understanding and acceptance.     Dental advisory given  Plan Discussed with:   Anesthesia Plan Comments:         Anesthesia Quick Evaluation

## 2019-01-04 NOTE — Progress Notes (Signed)
Interpretor at bedside. 

## 2019-01-04 NOTE — Anesthesia Procedure Notes (Signed)
Procedure Name: Intubation Date/Time: 01/04/2019 11:41 AM Performed by: Verdie Drown, CRNA Pre-anesthesia Checklist: Patient identified, Emergency Drugs available, Suction available and Patient being monitored Patient Re-evaluated:Patient Re-evaluated prior to induction Oxygen Delivery Method: Circle System Utilized Preoxygenation: Pre-oxygenation with 100% oxygen Induction Type: IV induction Ventilation: Mask ventilation without difficulty Laryngoscope Size: Mac and 4 Grade View: Grade III Tube type: Oral Tube size: 7.5 mm Number of attempts: 1 Airway Equipment and Method: Stylet and Oral airway Placement Confirmation: ETT inserted through vocal cords under direct vision,  positive ETCO2 and breath sounds checked- equal and bilateral Secured at: 23 cm Tube secured with: Tape Dental Injury: Teeth and Oropharynx as per pre-operative assessment

## 2019-01-05 ENCOUNTER — Encounter (HOSPITAL_COMMUNITY): Payer: Self-pay | Admitting: Vascular Surgery

## 2019-01-08 DIAGNOSIS — I129 Hypertensive chronic kidney disease with stage 1 through stage 4 chronic kidney disease, or unspecified chronic kidney disease: Secondary | ICD-10-CM | POA: Diagnosis not present

## 2019-01-08 DIAGNOSIS — N184 Chronic kidney disease, stage 4 (severe): Secondary | ICD-10-CM | POA: Diagnosis not present

## 2019-01-08 DIAGNOSIS — D631 Anemia in chronic kidney disease: Secondary | ICD-10-CM | POA: Diagnosis not present

## 2019-01-08 DIAGNOSIS — N189 Chronic kidney disease, unspecified: Secondary | ICD-10-CM | POA: Diagnosis not present

## 2019-01-08 DIAGNOSIS — Z905 Acquired absence of kidney: Secondary | ICD-10-CM | POA: Diagnosis not present

## 2019-01-08 DIAGNOSIS — N2581 Secondary hyperparathyroidism of renal origin: Secondary | ICD-10-CM | POA: Diagnosis not present

## 2019-01-08 NOTE — Progress Notes (Signed)
Patient discharged prior to wasting medication in Pyxis. RN wasted 50 mcg of Fentanyl with Ileene Rubens, RN in [place].

## 2019-01-17 DIAGNOSIS — G4733 Obstructive sleep apnea (adult) (pediatric): Secondary | ICD-10-CM | POA: Diagnosis not present

## 2019-02-01 DIAGNOSIS — N2581 Secondary hyperparathyroidism of renal origin: Secondary | ICD-10-CM | POA: Diagnosis not present

## 2019-02-01 DIAGNOSIS — N184 Chronic kidney disease, stage 4 (severe): Secondary | ICD-10-CM | POA: Diagnosis not present

## 2019-02-01 DIAGNOSIS — N189 Chronic kidney disease, unspecified: Secondary | ICD-10-CM | POA: Diagnosis not present

## 2019-02-01 DIAGNOSIS — I129 Hypertensive chronic kidney disease with stage 1 through stage 4 chronic kidney disease, or unspecified chronic kidney disease: Secondary | ICD-10-CM | POA: Diagnosis not present

## 2019-02-01 DIAGNOSIS — D631 Anemia in chronic kidney disease: Secondary | ICD-10-CM | POA: Diagnosis not present

## 2019-02-01 DIAGNOSIS — Z905 Acquired absence of kidney: Secondary | ICD-10-CM | POA: Diagnosis not present

## 2019-02-04 ENCOUNTER — Other Ambulatory Visit: Payer: Self-pay | Admitting: Nephrology

## 2019-02-04 DIAGNOSIS — N184 Chronic kidney disease, stage 4 (severe): Secondary | ICD-10-CM

## 2019-02-06 ENCOUNTER — Other Ambulatory Visit: Payer: Self-pay

## 2019-02-06 ENCOUNTER — Ambulatory Visit (INDEPENDENT_AMBULATORY_CARE_PROVIDER_SITE_OTHER): Payer: BLUE CROSS/BLUE SHIELD

## 2019-02-06 ENCOUNTER — Encounter: Payer: Self-pay | Admitting: Family Medicine

## 2019-02-06 ENCOUNTER — Ambulatory Visit (INDEPENDENT_AMBULATORY_CARE_PROVIDER_SITE_OTHER): Payer: BLUE CROSS/BLUE SHIELD | Admitting: Family Medicine

## 2019-02-06 VITALS — BP 165/77 | HR 93 | Temp 98.0°F | Resp 16 | Ht 69.0 in | Wt 245.8 lb

## 2019-02-06 DIAGNOSIS — R140 Abdominal distension (gaseous): Secondary | ICD-10-CM | POA: Diagnosis not present

## 2019-02-06 DIAGNOSIS — I10 Essential (primary) hypertension: Secondary | ICD-10-CM | POA: Diagnosis not present

## 2019-02-06 DIAGNOSIS — N184 Chronic kidney disease, stage 4 (severe): Secondary | ICD-10-CM

## 2019-02-06 DIAGNOSIS — R3989 Other symptoms and signs involving the genitourinary system: Secondary | ICD-10-CM | POA: Diagnosis not present

## 2019-02-06 LAB — POCT URINALYSIS DIP (MANUAL ENTRY)
Bilirubin, UA: NEGATIVE
Glucose, UA: NEGATIVE mg/dL
Ketones, POC UA: NEGATIVE mg/dL
Leukocytes, UA: NEGATIVE
Nitrite, UA: NEGATIVE
Protein Ur, POC: >=300 mg/dL — AB
Spec Grav, UA: 1.02 (ref 1.010–1.025)
Urobilinogen, UA: 0.2 E.U./dL
pH, UA: 5 (ref 5.0–8.0)

## 2019-02-06 NOTE — Progress Notes (Signed)
Established Patient Office Visit  Subjective:  Patient ID: [name], male    DOB: December 11, 1965  Age: 53 y.o. MRN: [id number]  CC:  Chief Complaint  Patient presents with  . post operative f/u    pt has questions as to whether he has other health issues other than kidney disease and wants comprehensive checks of all vital organs.  Pt thinks he may have a uti.  Pt urine collected    HPI [name] presents for   CKD Stage IV Patient reports that he states that he has a left AV fistula for dialysis but is not mature yet.  He reports that he feels like he wakes up in the morning with mucus or something in his throat He states that he feel like he has to throw up and has drainage from his mouth on a pillow  Poor appetite and abdominal distention He also report that after he eats his right upper quadrant he has bloating and states that he cannot eat.  He states that he cannot eat anything in the morning.  He feels like his abdomen is much bigger. He states that his vomiting does not help his stomach  He reports that he is not eating.  Wt Readings from Last 3 Encounters:  02/06/19 245 lb 12.8 oz (111.5 kg)  01/04/19 251 lb (113.9 kg)  12/10/18 258 lb 3.2 oz (117.1 kg)     He states that he wets the bed and feels like he has to go to the bathroom multiple times.  He states that he makes good quantity of urine.   He reports that his blood pressure is elevated despite being on the Labetalol, furosemide.  He reports that his      Past Medical History:  Diagnosis Date  . Anemia   . Back pain   . CKD (chronic kidney disease), stage III    Stage 4  . Colon polyps    adenomatous  . Elevated cholesterol   . History of echocardiogram    Echo 11/17: EF 65-70, normal wall motion, grade 1 diastolic dysfunction, trivial MR, mild LAE, mild TR  . Hyperlipidemia   . Hypertension    no current bp meds for last 3 months  . Kidney stones 2007  . Medication intolerance    a. multiple with  prior nonadherence to regimen.  . Otosclerosis of both ears   . Pneumonia   . PUD (peptic ulcer disease)    Has had unspecified surgery for this  . Sleep apnea     Past Surgical History:  Procedure Laterality Date  . AV FISTULA PLACEMENT Left 01/04/2019   Procedure: ARTERIOVENOUS (AV) FISTULA CREATION LEFT ARM;  Surgeon: [name], MD;  Location: [facility];  Service: Vascular;  Laterality: Left;  . NEPHRECTOMY  02/18/2011   Procedure: NEPHRECTOMY;  Surgeon: Hanley Ben, MD;  Location: [facility];  Service: Urology;  Laterality: Right;  . SMALL INTESTINE SURGERY    . STAPEDOTOMY  2005   lt ear jan, right ear sept  . surgery for ulcers  1990    Family History  Problem Relation Age of Onset  . Hyperlipidemia Father   . Heart disease Mother   . Hypertension Mother   . Hypertension Sister   . Heart disease Brother 16       CABG  . Hyperlipidemia Sister   . Hyperlipidemia Sister   . Esophageal cancer Cousin   . Healthy Child   . Colon cancer Neg Hx   .  Stomach cancer Neg Hx   . Rectal cancer Neg Hx     Social History   Socioeconomic History  . Marital status: Married    Spouse name: Edgardo Roys  . Number of children: 5  . Years of education: Not on file  . Highest education level: 12th grade  Occupational History    Employer: [employer]  Tobacco Use  . Smoking status: Never Smoker  . Smokeless tobacco: Never Used  Substance and Sexual Activity  . Alcohol use: No  . Drug use: No  . Sexual activity: Yes    Birth control/protection: None  Other Topics Concern  . Not on file  Social History Narrative   Lives at home with wife and family. He is from [place]. Came to the [place] in 2002.      Patient is right-handed. He lives in a one level home. He drinks 1-2 cups of coffee and tea a day. He does not exercise.   Social Determinants of Health   Financial Resource Strain: Low Risk   . Difficulty of Paying Living Expenses: Not hard at all  Food Insecurity: No Food  Insecurity  . Worried About Charity fundraiser in the Last Year: Never true  . Ran Out of Food in the Last Year: Never true  Transportation Needs: No Transportation Needs  . Lack of Transportation (Medical): No  . Lack of Transportation (Non-Medical): No  Physical Activity: Inactive  . Days of Exercise per Week: 0 days  . Minutes of Exercise per Session: 0 min  Stress: No Stress Concern Present  . Feeling of Stress : Not at all  Social Connections: Slightly Isolated  . Frequency of Communication with Friends and Family: More than three times a week  . Frequency of Social Gatherings with Friends and Family: More than three times a week  . Attends Religious Services: More than 4 times per year  . Active Member of Clubs or Organizations: No  . Attends Archivist Meetings: Never  . Marital Status: Married  Human resources officer Violence: Not At Risk  . Fear of Current or Ex-Partner: No  . Emotionally Abused: No  . Physically Abused: No  . Sexually Abused: No    Outpatient Medications Prior to Visit  Medication Sig Dispense Refill  . acetaminophen (TYLENOL) 500 MG tablet Take 1 tablet (500 mg total) by mouth every 6 (six) hours as needed for moderate pain. 30 tablet 0  . azithromycin (ZITHROMAX) 250 MG tablet Take 2 tablets today, then one tablet each day after. 6 tablet 0  . calcitRIOL (ROCALTROL) 0.25 MCG capsule Take 0.25 mcg by mouth daily.  11  . calcium acetate (PHOSLO) 667 MG capsule Take 2 capsules (1,334 mg total) by mouth 3 (three) times daily with meals. 180 capsule 3  . cholecalciferol (VITAMIN D) 25 MCG (1000 UT) tablet Take 1,000 Units by mouth daily.    . furosemide (LASIX) 40 MG tablet Take 1 tablet (40 mg total) by mouth daily. 90 tablet 0  . HYDROcodone-acetaminophen (NORCO/VICODIN) 5-325 MG tablet Take 1 tablet by mouth every 6 (six) hours as needed for moderate pain. 15 tablet 0  . Icosapent Ethyl 1 g CAPS Take 2 capsules (2 g total) by mouth 2 (two) times  daily. 120 capsule 11  . labetalol (NORMODYNE) 200 MG tablet Take 2 tablets (400 mg total) by mouth 2 (two) times daily. 120 tablet 0  . rosuvastatin (CRESTOR) 10 MG tablet Take 10 mg by mouth daily.    Marland Kitchen  sodium zirconium cyclosilicate (LOKELMA) 10 g PACK packet Take 10 g by mouth.    . Vitamin D, Ergocalciferol, (DRISDOL) 1.25 MG (50000 UT) CAPS capsule Take 1 capsule (50,000 Units total) by mouth every 7 (seven) days. 4 capsule 6   No facility-administered medications prior to visit.    Allergies  Allergen Reactions  . Beef-Derived Products Other (See Comments)    Cultural preference  . Pegademase Bovine Other (See Comments)    Cultural preference  . Poractant Alfa Other (See Comments)    Cultural preference  . Pork-Derived Products Other (See Comments)    Cultural preference    ROS Review of Systems Review of Systems  Constitutional: Negative for activity change, appetite change, chills and fever.  HENT: Negative for congestion, nosebleeds, trouble swallowing and voice change.   Respiratory: Negative for cough, shortness of breath and wheezing.   Gastrointestinal: Negative for diarrhea, nausea and vomiting.  Genitourinary: Negative for difficulty urinating, dysuria, flank pain and hematuria.  Musculoskeletal: Negative for back pain, joint swelling and neck pain.  Neurological: Negative for dizziness, speech difficulty, light-headedness and numbness.  See HPI. All other review of systems negative.     Objective:    Physical Exam  BP (!) 165/77 (BP Location: Left Arm, Patient Position: Sitting, Cuff Size: Large)   Pulse 93   Temp 98 F (36.7 C) (Oral)   Resp 16   Ht 5\' 9"  (1.753 m)   Wt 245 lb 12.8 oz (111.5 kg)   SpO2 99%   BMI 36.30 kg/m  Wt Readings from Last 3 Encounters:  02/06/19 245 lb 12.8 oz (111.5 kg)  01/04/19 251 lb (113.9 kg)  12/10/18 258 lb 3.2 oz (117.1 kg)   Physical Exam  Constitutional: Oriented to person, place, and time. Appears  well-developed and well-nourished.  HENT:  Head: Normocephalic and atraumatic.  Eyes: Conjunctivae and EOM are normal.  Cardiovascular: Normal rate, regular rhythm, normal heart sounds and intact distal pulses.  No murmur heard. Pulmonary/Chest: Effort normal and breath sounds normal. No stridor. No respiratory distress. Has no wheezes. Abdomen: right upper quadrant pain, normoactive bs, soft, appears bloated, with percussion of the abdomen there is dullness and enlargement of the liver.   Neurological: Is alert and oriented to person, place, and time.  Skin: Skin is warm. Capillary refill takes less than 2 seconds.  Psychiatric: Has a normal mood and affect. Behavior is normal. Judgment and thought content normal.    There are no preventive care reminders to display for this patient.  There are no preventive care reminders to display for this patient.  Lab Results  Component Value Date   TSH 1.125 08/25/2018   Lab Results  Component Value Date   WBC 4.2 10/16/2018   HGB 9.2 (L) 01/04/2019   HCT 27.0 (L) 01/04/2019   MCV 85 10/16/2018   PLT 178 10/16/2018   Lab Results  Component Value Date   NA 136 01/04/2019   K 5.8 (H) 01/04/2019   CO2 18 (L) 10/16/2018   GLUCOSE 98 01/04/2019   BUN 111 (H) 01/04/2019   CREATININE 8.50 (H) 01/04/2019   BILITOT 0.3 10/16/2018   ALKPHOS 40 10/16/2018   AST 18 10/16/2018   ALT 15 10/16/2018   PROT 6.6 10/16/2018   ALBUMIN 4.4 10/16/2018   CALCIUM 8.4 (L) 10/16/2018   ANIONGAP 11 08/27/2018   GFR 54.07 (L) 05/06/2014   Lab Results  Component Value Date   CHOL 313 (H) 08/24/2018   Lab Results  Component  Value Date   HDL 24 (L) 08/24/2018   Lab Results  Component Value Date   LDLCALC Comment 08/24/2018   Lab Results  Component Value Date   TRIG 1,340 (HH) 08/24/2018   Lab Results  Component Value Date   CHOLHDL 13.0 (H) 08/24/2018   Lab Results  Component Value Date   HGBA1C 5.4 10/11/2017      Assessment & Plan:    Problem List Items Addressed This Visit      Cardiovascular and Mediastinum   Uncontrolled hypertension - Primary Anticipate that dialysis will relieve his chronic uncontrolled hypertension     Genitourinary   CKD (chronic kidney disease) stage 4, GFR 15-29 ml/min (HCC) Anticipate that dialysis will relieve his chronic uncontrolled hypertension    Other Visit Diagnoses    Possible urinary tract infection    No UTI noted    Relevant Orders   POCT urinalysis dipstick (Completed)   Abdominal distension    No obvious blockage on xray Advised pt to follow up with CT abdomen   Relevant Orders   DG Abd 1 View (Completed)        Discussed that he should continue with Nephrology as instructed.   No orders of the defined types were placed in this encounter.   Follow-up: No follow-ups on file.    Forrest Moron, MD

## 2019-02-06 NOTE — Patient Instructions (Signed)
° ° ° °  If you have lab work done today you will be contacted with your lab results within the next 2 weeks.  If you have not heard from us then please contact us. The fastest way to get your results is to register for My Chart. ° ° °IF you received an x-ray today, you will receive an invoice from [facility]. Please contact [facility] at [phone] with questions or concerns regarding your invoice.  ° °IF you received labwork today, you will receive an invoice from [facility]. Please contact [facility] at [phone] with questions or concerns regarding your invoice.  ° °Our billing staff will not be able to assist you with questions regarding bills from these companies. ° °You will be contacted with the lab results as soon as they are available. The fastest way to get your results is to activate your My Chart account. Instructions are located on the last page of this paperwork. If you have not heard from us regarding the results in 2 weeks, please contact this office. °  ° ° ° °

## 2019-02-11 ENCOUNTER — Other Ambulatory Visit: Payer: Self-pay | Admitting: Nephrology

## 2019-02-18 ENCOUNTER — Other Ambulatory Visit: Payer: BLUE CROSS/BLUE SHIELD

## 2019-02-18 ENCOUNTER — Other Ambulatory Visit: Payer: Self-pay

## 2019-02-18 DIAGNOSIS — N185 Chronic kidney disease, stage 5: Secondary | ICD-10-CM

## 2019-02-19 ENCOUNTER — Encounter: Payer: Self-pay | Admitting: Vascular Surgery

## 2019-02-19 ENCOUNTER — Ambulatory Visit (INDEPENDENT_AMBULATORY_CARE_PROVIDER_SITE_OTHER): Payer: Self-pay | Admitting: Vascular Surgery

## 2019-02-19 ENCOUNTER — Other Ambulatory Visit: Payer: Self-pay

## 2019-02-19 ENCOUNTER — Ambulatory Visit (HOSPITAL_COMMUNITY)
Admission: RE | Admit: 2019-02-19 | Discharge: 2019-02-19 | Disposition: A | Discharge status: Home / Self Care | Payer: PRIVATE HEALTH INSURANCE | Source: Ambulatory Visit | Attending: Vascular Surgery | Admitting: Vascular Surgery

## 2019-02-19 VITALS — BP 166/85 | HR 88 | Temp 97.3°F | Resp 18 | Ht 69.0 in | Wt 245.0 lb

## 2019-02-19 DIAGNOSIS — N185 Chronic kidney disease, stage 5: Secondary | ICD-10-CM

## 2019-02-19 DIAGNOSIS — N184 Chronic kidney disease, stage 4 (severe): Secondary | ICD-10-CM

## 2019-02-19 NOTE — Progress Notes (Signed)
Patient name: [name] MRN: [id number] DOB: [birth year] Sex: male  REASON FOR VISIT: Follow-up after left 1st stage basilic vein fistula  HPI: [name] is a 54 y.o. male with stage IV chronic kidney disease that presents for postop check after left first stage basilic vein transposition on 01/04/2019.  Patient denies any numbness or tingling in the left hand.  He states he can feel a good thrill in the fistula.  Incision has healed without issue.  He has not started dialysis at this time.  States he is due to see his kidney doctor again in 1 week.  Past Medical History:  Diagnosis Date  . Anemia   . Back pain   . CKD (chronic kidney disease), stage III    Stage 4  . Colon polyps    adenomatous  . Elevated cholesterol   . History of echocardiogram    Echo 11/17: EF 65-70, normal wall motion, grade 1 diastolic dysfunction, trivial MR, mild LAE, mild TR  . Hyperlipidemia   . Hypertension    no current bp meds for last 3 months  . Kidney stones 2007  . Medication intolerance    a. multiple with prior nonadherence to regimen.  . Otosclerosis of both ears   . Pneumonia   . PUD (peptic ulcer disease)    Has had unspecified surgery for this  . Sleep apnea     Past Surgical History:  Procedure Laterality Date  . AV FISTULA PLACEMENT Left 01/04/2019   Procedure: ARTERIOVENOUS (AV) FISTULA CREATION LEFT ARM;  Surgeon: [name], MD;  Location: [facility];  Service: Vascular;  Laterality: Left;  . NEPHRECTOMY  02/18/2011   Procedure: NEPHRECTOMY;  Surgeon: Hanley Ben, MD;  Location: [facility];  Service: Urology;  Laterality: Right;  . SMALL INTESTINE SURGERY    . STAPEDOTOMY  2005   lt ear jan, right ear sept  . surgery for ulcers  1990    Family History  Problem Relation Age of Onset  . Hyperlipidemia Father   . Heart disease Mother   . Hypertension Mother   . Hypertension Sister   . Heart disease Brother 4       CABG  . Hyperlipidemia Sister   . Hyperlipidemia  Sister   . Esophageal cancer Cousin   . Healthy Child   . Colon cancer Neg Hx   . Stomach cancer Neg Hx   . Rectal cancer Neg Hx     SOCIAL HISTORY: Social History   Tobacco Use  . Smoking status: Never Smoker  . Smokeless tobacco: Never Used  Substance Use Topics  . Alcohol use: No    Allergies  Allergen Reactions  . Beef-Derived Products Other (See Comments)    Cultural preference  . Pegademase Bovine Other (See Comments)    Cultural preference  . Poractant Alfa Other (See Comments)    Cultural preference  . Pork-Derived Products Other (See Comments)    Cultural preference    Current Outpatient Medications  Medication Sig Dispense Refill  . acetaminophen (TYLENOL) 500 MG tablet Take 1 tablet (500 mg total) by mouth every 6 (six) hours as needed for moderate pain. 30 tablet 0  . azithromycin (ZITHROMAX) 250 MG tablet Take 2 tablets today, then one tablet each day after. 6 tablet 0  . calcitRIOL (ROCALTROL) 0.25 MCG capsule Take 0.25 mcg by mouth daily.  11  . calcium acetate (PHOSLO) 667 MG capsule Take 2 capsules (1,334 mg total) by mouth 3 (three) times daily  with meals. 180 capsule 3  . cholecalciferol (VITAMIN D) 25 MCG (1000 UT) tablet Take 1,000 Units by mouth daily.    . furosemide (LASIX) 40 MG tablet Take 1 tablet (40 mg total) by mouth daily. 90 tablet 0  . HYDROcodone-acetaminophen (NORCO/VICODIN) 5-325 MG tablet Take 1 tablet by mouth every 6 (six) hours as needed for moderate pain. 15 tablet 0  . Icosapent Ethyl 1 g CAPS Take 2 capsules (2 g total) by mouth 2 (two) times daily. 120 capsule 11  . labetalol (NORMODYNE) 200 MG tablet Take 2 tablets (400 mg total) by mouth 2 (two) times daily. 120 tablet 0  . rosuvastatin (CRESTOR) 10 MG tablet Take 10 mg by mouth daily.    . sodium zirconium cyclosilicate (LOKELMA) 10 g PACK packet Take 10 g by mouth.    . Vitamin D, Ergocalciferol, (DRISDOL) 1.25 MG (50000 UT) CAPS capsule Take 1 capsule (50,000 Units total) by  mouth every 7 (seven) days. 4 capsule 6   No current facility-administered medications for this visit.    REVIEW OF SYSTEMS:  [X]  denotes positive finding, [ ]  denotes negative finding Cardiac  Comments:  Chest pain or chest pressure:    Shortness of breath upon exertion:    Short of breath when lying flat:    Irregular heart rhythm:        Vascular    Pain in calf, thigh, or hip brought on by ambulation:    Pain in feet at night that wakes you up from your sleep:     Blood clot in your veins:    Leg swelling:         Pulmonary    Oxygen at home:    Productive cough:     Wheezing:         Neurologic    Sudden weakness in arms or legs:     Sudden numbness in arms or legs:     Sudden onset of difficulty speaking or slurred speech:    Temporary loss of vision in one eye:     Problems with dizziness:         Gastrointestinal    Blood in stool:     Vomited blood:         Genitourinary    Burning when urinating:     Blood in urine:        Psychiatric    Major depression:         Hematologic    Bleeding problems:    Problems with blood clotting too easily:        Skin    Rashes or ulcers:        Constitutional    Fever or chills:      PHYSICAL EXAM: Vitals:   02/19/19 0818  BP: (!) 166/85  Pulse: 88  Resp: 18  Temp: (!) 97.3 F (36.3 C)  TempSrc: Temporal  SpO2: 100%  Weight: 245 lb (111.1 kg)  Height: 5\' 9"  (1.753 m)    GENERAL: The patient is a well-nourished male, in no acute distress. The vital signs are documented above. CARDIAC: There is a regular rate and rhythm.  VASCULAR:  Left basilic vein fistula with good thrill Left radial pulse palpable Left arm incision well healed   DATA:   I independently reviewed left upper extremity AV fistula duplex and widely patent basilic vein fistula after first stage with good flow volumes  Assessment/Plan:  54 year old male with stage IV chronic kidney disease that presents  for postop check after left  first stage basilic vein fistula on 97/94/8016.  He has an excellent thrill in the fistula with a palpable radial pulse at the wrist.  Incision has healed without issue.  Fistula looks widely patent and and matured nicely based on fistula duplex today.  Discussed plans for second stage basilic vein fistula.  We will schedule for next available date.  Surgery details discussed with patient as well as risks and benefits.   [name], MD [facility] Office: [phone] Pager: [phone]

## 2019-02-20 NOTE — Discharge Instructions (Signed)

## 2019-02-21 ENCOUNTER — Other Ambulatory Visit: Payer: Self-pay

## 2019-02-21 ENCOUNTER — Encounter (HOSPITAL_COMMUNITY)
Admission: RE | Admit: 2019-02-21 | Discharge: 2019-02-21 | Disposition: A | Discharge status: Home / Self Care | Payer: 59 | Source: Ambulatory Visit | Attending: Nephrology | Admitting: Nephrology

## 2019-02-21 VITALS — BP 170/103 | HR 105 | Temp 97.3°F | Resp 18

## 2019-02-21 DIAGNOSIS — N184 Chronic kidney disease, stage 4 (severe): Secondary | ICD-10-CM | POA: Diagnosis not present

## 2019-02-21 LAB — POCT HEMOGLOBIN-HEMACUE: Hemoglobin: 9.3 g/dL — ABNORMAL LOW (ref 13.0–17.0)

## 2019-02-21 MED ORDER — EPOETIN ALFA-EPBX 10000 UNIT/ML IJ SOLN
INTRAMUSCULAR | Status: AC
  Administered 2019-02-21: 10000 [IU] via SUBCUTANEOUS
  Filled 2019-02-21: qty 1

## 2019-02-21 MED ORDER — EPOETIN ALFA-EPBX 10000 UNIT/ML IJ SOLN: 10000.0000 [IU] | INTRAMUSCULAR | Status: DC

## 2019-02-26 ENCOUNTER — Other Ambulatory Visit: Payer: Self-pay | Admitting: Nephrology

## 2019-03-01 ENCOUNTER — Other Ambulatory Visit: Payer: Self-pay

## 2019-03-01 ENCOUNTER — Ambulatory Visit
Admission: RE | Admit: 2019-03-01 | Discharge: 2019-03-01 | Disposition: A | Discharge status: Home / Self Care | Payer: PRIVATE HEALTH INSURANCE | Source: Ambulatory Visit | Attending: Nephrology | Admitting: Nephrology

## 2019-03-01 ENCOUNTER — Telehealth: Payer: Self-pay | Admitting: Internal Medicine

## 2019-03-01 DIAGNOSIS — N184 Chronic kidney disease, stage 4 (severe): Secondary | ICD-10-CM

## 2019-03-01 DIAGNOSIS — E785 Hyperlipidemia, unspecified: Secondary | ICD-10-CM

## 2019-03-01 DIAGNOSIS — E781 Pure hyperglyceridemia: Secondary | ICD-10-CM

## 2019-03-01 NOTE — Telephone Encounter (Signed)
Left message for pt to call back  °

## 2019-03-02 LAB — HEPATIC FUNCTION PANEL
ALT: 5 IU/L (ref 0–44)
AST: 10 IU/L (ref 0–40)
Albumin: 4.1 g/dL (ref 3.8–4.9)
Alkaline Phosphatase: 65 IU/L (ref 39–117)
Bilirubin Total: 0.2 mg/dL (ref 0.0–1.2)
Bilirubin, Direct: 0.08 mg/dL (ref 0.00–0.40)
Total Protein: 6.2 g/dL (ref 6.0–8.5)

## 2019-03-02 LAB — LIPID PANEL
Chol/HDL Ratio: 11.5 ratio — ABNORMAL HIGH (ref 0.0–5.0)
Cholesterol, Total: 264 mg/dL — ABNORMAL HIGH (ref 100–199)
HDL: 23 mg/dL — ABNORMAL LOW (ref >39)
LDL Chol Calc (NIH): 109 mg/dL — ABNORMAL HIGH (ref 0–99)
Triglycerides: 741 mg/dL (ref 0–149)
VLDL Cholesterol Cal: 132 mg/dL — ABNORMAL HIGH (ref 5–40)

## 2019-03-04 NOTE — Telephone Encounter (Signed)
Attempted to call pt again and received message that voice  Mailbox if full and cannot new messages.

## 2019-03-07 ENCOUNTER — Encounter: Payer: Self-pay | Admitting: Internal Medicine

## 2019-03-07 ENCOUNTER — Other Ambulatory Visit: Payer: Self-pay

## 2019-03-07 ENCOUNTER — Encounter (HOSPITAL_COMMUNITY)
Admission: RE | Admit: 2019-03-07 | Discharge: 2019-03-07 | Disposition: A | Discharge status: Home / Self Care | Payer: 59 | Source: Ambulatory Visit | Attending: Nephrology | Admitting: Nephrology

## 2019-03-07 VITALS — BP 169/87 | HR 98 | Temp 97.3°F | Resp 18

## 2019-03-07 DIAGNOSIS — N184 Chronic kidney disease, stage 4 (severe): Secondary | ICD-10-CM | POA: Diagnosis not present

## 2019-03-07 LAB — POCT HEMOGLOBIN-HEMACUE: Hemoglobin: 8.6 g/dL — ABNORMAL LOW (ref 13.0–17.0)

## 2019-03-07 MED ORDER — EPOETIN ALFA-EPBX 10000 UNIT/ML IJ SOLN: 10000.0000 [IU] | INTRAMUSCULAR | Status: DC

## 2019-03-07 MED ORDER — EPOETIN ALFA-EPBX 10000 UNIT/ML IJ SOLN
INTRAMUSCULAR | Status: AC
  Administered 2019-03-07: 10000 [IU]
  Filled 2019-03-07: qty 1

## 2019-03-08 ENCOUNTER — Encounter (HOSPITAL_COMMUNITY): Payer: Self-pay | Admitting: Vascular Surgery

## 2019-03-08 ENCOUNTER — Other Ambulatory Visit (HOSPITAL_COMMUNITY)
Admission: RE | Admit: 2019-03-08 | Discharge: 2019-03-08 | Disposition: A | Discharge status: Home / Self Care | Payer: 59 | Source: Ambulatory Visit | Attending: Vascular Surgery | Admitting: Vascular Surgery

## 2019-03-08 ENCOUNTER — Other Ambulatory Visit: Payer: Self-pay

## 2019-03-08 DIAGNOSIS — Z01812 Encounter for preprocedural laboratory examination: Secondary | ICD-10-CM | POA: Insufficient documentation

## 2019-03-08 DIAGNOSIS — Z20822 Contact with and (suspected) exposure to covid-19: Secondary | ICD-10-CM | POA: Diagnosis not present

## 2019-03-08 LAB — SARS CORONAVIRUS 2 (TAT 6-24 HRS): SARS Coronavirus 2: NEGATIVE

## 2019-03-08 NOTE — Progress Notes (Signed)
Pt SDW-pre-op call completed by pt using Arabic Interpreter Ahmed [phone]. Pt denies any acute pulmonary issues. Pt denies having chest pain. Pt stated that he is under the care of Dr. Debara Pickett, Cardiology. Pt denies having a cardiac cath. Pt made aware to stop taking vitamins, fish oil       (Vascepa ) and herbal medications. Do not take any NSAIDs ie: Ibuprofen, Advil, Naproxen (Aleve), Motrin, BC and Goody Powder. Pt reminded to quarantine. Pt verbalized understanding of all pre-op instructions.

## 2019-03-08 NOTE — Telephone Encounter (Signed)
Pt scheduled to see Dr. Henrene Pastor 03/14/19@8 :40am. Pt aware of appt.

## 2019-03-11 ENCOUNTER — Encounter (HOSPITAL_COMMUNITY)
Admission: RE | Disposition: A | Discharge status: Home / Self Care | Payer: Self-pay | Source: Home / Self Care | Attending: Vascular Surgery

## 2019-03-11 ENCOUNTER — Ambulatory Visit (HOSPITAL_COMMUNITY): Payer: 59 | Admitting: Anesthesiology

## 2019-03-11 ENCOUNTER — Other Ambulatory Visit: Payer: Self-pay

## 2019-03-11 ENCOUNTER — Ambulatory Visit (HOSPITAL_COMMUNITY)
Admission: RE | Admit: 2019-03-11 | Discharge: 2019-03-11 | Disposition: A | Discharge status: Home / Self Care | Payer: 59 | Attending: Vascular Surgery | Admitting: Vascular Surgery

## 2019-03-11 ENCOUNTER — Encounter (HOSPITAL_COMMUNITY): Payer: Self-pay | Admitting: Vascular Surgery

## 2019-03-11 DIAGNOSIS — I129 Hypertensive chronic kidney disease with stage 1 through stage 4 chronic kidney disease, or unspecified chronic kidney disease: Secondary | ICD-10-CM | POA: Insufficient documentation

## 2019-03-11 DIAGNOSIS — N184 Chronic kidney disease, stage 4 (severe): Secondary | ICD-10-CM | POA: Insufficient documentation

## 2019-03-11 DIAGNOSIS — E785 Hyperlipidemia, unspecified: Secondary | ICD-10-CM | POA: Insufficient documentation

## 2019-03-11 DIAGNOSIS — Z95828 Presence of other vascular implants and grafts: Secondary | ICD-10-CM | POA: Diagnosis not present

## 2019-03-11 DIAGNOSIS — Z8249 Family history of ischemic heart disease and other diseases of the circulatory system: Secondary | ICD-10-CM | POA: Insufficient documentation

## 2019-03-11 DIAGNOSIS — Z951 Presence of aortocoronary bypass graft: Secondary | ICD-10-CM | POA: Insufficient documentation

## 2019-03-11 DIAGNOSIS — Z79899 Other long term (current) drug therapy: Secondary | ICD-10-CM | POA: Diagnosis not present

## 2019-03-11 DIAGNOSIS — Z905 Acquired absence of kidney: Secondary | ICD-10-CM | POA: Insufficient documentation

## 2019-03-11 HISTORY — DX: Other specified postprocedural states: Z98.890

## 2019-03-11 HISTORY — DX: Nausea with vomiting, unspecified: R11.2

## 2019-03-11 HISTORY — PX: BASCILIC VEIN TRANSPOSITION: SHX5742

## 2019-03-11 HISTORY — DX: Nausea: R11.0

## 2019-03-11 LAB — POCT I-STAT, CHEM 8
BUN: 82 mg/dL — ABNORMAL HIGH (ref 6–20)
Calcium, Ion: 0.94 mmol/L — ABNORMAL LOW (ref 1.15–1.40)
Chloride: 112 mmol/L — ABNORMAL HIGH (ref 98–111)
Creatinine, Ser: 8.9 mg/dL — ABNORMAL HIGH (ref 0.61–1.24)
Glucose, Bld: 101 mg/dL — ABNORMAL HIGH (ref 70–99)
HCT: 27 % — ABNORMAL LOW (ref 39.0–52.0)
Hemoglobin: 9.2 g/dL — ABNORMAL LOW (ref 13.0–17.0)
Potassium: 5.6 mmol/L — ABNORMAL HIGH (ref 3.5–5.1)
Sodium: 140 mmol/L (ref 135–145)
TCO2: 18 mmol/L — ABNORMAL LOW (ref 22–32)

## 2019-03-11 SURGERY — TRANSPOSITION, VEIN, BASILIC
Anesthesia: General | Laterality: Left

## 2019-03-11 MED ORDER — LIDOCAINE 2% (20 MG/ML) 5 ML SYRINGE
INTRAMUSCULAR | Status: AC
  Filled 2019-03-11: qty 5

## 2019-03-11 MED ORDER — PHENYLEPHRINE HCL (PRESSORS) 10 MG/ML IV SOLN
INTRAVENOUS | Status: DC | PRN
  Administered 2019-03-11 (×2): 80 ug via INTRAVENOUS

## 2019-03-11 MED ORDER — ONDANSETRON HCL 4 MG/2ML IJ SOLN: 4.0000 mg | Freq: Once | INTRAMUSCULAR | Status: AC

## 2019-03-11 MED ORDER — DEXAMETHASONE SODIUM PHOSPHATE 10 MG/ML IJ SOLN
INTRAMUSCULAR | Status: AC
  Filled 2019-03-11: qty 1

## 2019-03-11 MED ORDER — SCOPOLAMINE 1 MG/3DAYS TD PT72
MEDICATED_PATCH | TRANSDERMAL | Status: DC | PRN
  Administered 2019-03-11: 1 via TRANSDERMAL

## 2019-03-11 MED ORDER — CHLORHEXIDINE GLUCONATE 4 % EX LIQD: 60.0000 mL | Freq: Once | CUTANEOUS | Status: DC

## 2019-03-11 MED ORDER — SODIUM CHLORIDE 0.9 % IV SOLN
INTRAVENOUS | Status: AC
  Filled 2019-03-11: qty 1.2

## 2019-03-11 MED ORDER — PHENYLEPHRINE 40 MCG/ML (10ML) SYRINGE FOR IV PUSH (FOR BLOOD PRESSURE SUPPORT)
PREFILLED_SYRINGE | INTRAVENOUS | Status: AC
  Filled 2019-03-11: qty 10

## 2019-03-11 MED ORDER — FENTANYL CITRATE (PF) 250 MCG/5ML IJ SOLN
INTRAMUSCULAR | Status: AC
  Filled 2019-03-11: qty 5

## 2019-03-11 MED ORDER — FENTANYL CITRATE (PF) 100 MCG/2ML IJ SOLN
INTRAMUSCULAR | Status: DC | PRN
  Administered 2019-03-11: 25 ug via INTRAVENOUS
  Administered 2019-03-11: 50 ug via INTRAVENOUS
  Administered 2019-03-11: 25 ug via INTRAVENOUS
  Administered 2019-03-11: 50 ug via INTRAVENOUS
  Administered 2019-03-11: 25 ug via INTRAVENOUS

## 2019-03-11 MED ORDER — LIDOCAINE HCL (CARDIAC) PF 100 MG/5ML IV SOSY
PREFILLED_SYRINGE | INTRAVENOUS | Status: DC | PRN
  Administered 2019-03-11: 60 mg via INTRAVENOUS

## 2019-03-11 MED ORDER — GLYCOPYRROLATE PF 0.2 MG/ML IJ SOSY
PREFILLED_SYRINGE | INTRAMUSCULAR | Status: AC
  Filled 2019-03-11: qty 1

## 2019-03-11 MED ORDER — ONDANSETRON HCL 4 MG/2ML IJ SOLN
INTRAMUSCULAR | Status: AC
  Administered 2019-03-11: 09:00:00 4 mg via INTRAVENOUS
  Filled 2019-03-11: qty 2

## 2019-03-11 MED ORDER — LIDOCAINE HCL (PF) 1 % IJ SOLN
INTRAMUSCULAR | Status: AC
  Filled 2019-03-11: qty 30

## 2019-03-11 MED ORDER — 0.9 % SODIUM CHLORIDE (POUR BTL) OPTIME
TOPICAL | Status: DC | PRN
  Administered 2019-03-11: 1000 mL

## 2019-03-11 MED ORDER — PROPOFOL 10 MG/ML IV BOLUS
INTRAVENOUS | Status: DC | PRN
  Administered 2019-03-11: 130 mg via INTRAVENOUS

## 2019-03-11 MED ORDER — PROPOFOL 10 MG/ML IV BOLUS
INTRAVENOUS | Status: AC
  Filled 2019-03-11: qty 20

## 2019-03-11 MED ORDER — HYDROMORPHONE HCL 1 MG/ML IJ SOLN
0.2500 mg | INTRAMUSCULAR | Status: DC | PRN
  Administered 2019-03-11: 12:00:00 0.5 mg via INTRAVENOUS
  Administered 2019-03-11: 12:00:00 0.25 mg via INTRAVENOUS
  Administered 2019-03-11: 0.5 mg via INTRAVENOUS
  Administered 2019-03-11: 12:00:00 0.25 mg via INTRAVENOUS

## 2019-03-11 MED ORDER — ONDANSETRON HCL 4 MG/2ML IJ SOLN
INTRAMUSCULAR | Status: AC
  Filled 2019-03-11: qty 2

## 2019-03-11 MED ORDER — SODIUM CHLORIDE 0.9 % IV SOLN: INTRAVENOUS | Status: DC | PRN

## 2019-03-11 MED ORDER — HEPARIN SODIUM (PORCINE) 1000 UNIT/ML IJ SOLN
INTRAMUSCULAR | Status: DC | PRN
  Administered 2019-03-11: 3000 [IU] via INTRAVENOUS

## 2019-03-11 MED ORDER — CEFAZOLIN SODIUM-DEXTROSE 2-4 GM/100ML-% IV SOLN
2.0000 g | INTRAVENOUS | Status: AC
  Administered 2019-03-11: 2 g via INTRAVENOUS
  Filled 2019-03-11: qty 100

## 2019-03-11 MED ORDER — ACETAMINOPHEN 500 MG PO TABS
1000.0000 mg | ORAL_TABLET | Freq: Once | ORAL | Status: AC
  Administered 2019-03-11: 1000 mg via ORAL
  Filled 2019-03-11: qty 2

## 2019-03-11 MED ORDER — SCOPOLAMINE 1 MG/3DAYS TD PT72
MEDICATED_PATCH | TRANSDERMAL | Status: AC
  Filled 2019-03-11: qty 1

## 2019-03-11 MED ORDER — HYDROCODONE-ACETAMINOPHEN 5-325 MG PO TABS: 1.0000 | ORAL_TABLET | ORAL | 0 refills | Status: DC | PRN

## 2019-03-11 MED ORDER — HYDROMORPHONE HCL 1 MG/ML IJ SOLN
INTRAMUSCULAR | Status: AC
  Filled 2019-03-11: qty 1

## 2019-03-11 MED ORDER — SODIUM CHLORIDE 0.9 % IV SOLN
INTRAVENOUS | Status: DC | PRN
  Administered 2019-03-11: 35 ug/min via INTRAVENOUS

## 2019-03-11 MED ORDER — DEXAMETHASONE SODIUM PHOSPHATE 10 MG/ML IJ SOLN
INTRAMUSCULAR | Status: DC | PRN
  Administered 2019-03-11: 5 mg via INTRAVENOUS

## 2019-03-11 MED ORDER — SODIUM CHLORIDE 0.9 % IV SOLN
INTRAVENOUS | Status: DC | PRN
  Administered 2019-03-11: 500 mL

## 2019-03-11 MED ORDER — ONDANSETRON HCL 4 MG/2ML IJ SOLN
INTRAMUSCULAR | Status: DC | PRN
  Administered 2019-03-11: 4 mg via INTRAVENOUS

## 2019-03-11 MED ORDER — HYDROCODONE-ACETAMINOPHEN 5-325 MG PO TABS
1.0000 | ORAL_TABLET | Freq: Once | ORAL | Status: AC
  Administered 2019-03-11: 13:00:00 1 via ORAL

## 2019-03-11 MED ORDER — ROCURONIUM BROMIDE 10 MG/ML (PF) SYRINGE
PREFILLED_SYRINGE | INTRAVENOUS | Status: AC
  Filled 2019-03-11: qty 10

## 2019-03-11 MED ORDER — SODIUM CHLORIDE 0.9 % IV SOLN: INTRAVENOUS | Status: DC

## 2019-03-11 MED ORDER — HYDROCODONE-ACETAMINOPHEN 5-325 MG PO TABS
ORAL_TABLET | ORAL | Status: AC
  Filled 2019-03-11: qty 1

## 2019-03-11 SURGICAL SUPPLY — 44 items
ADH SKN CLS APL DERMABOND .7 (GAUZE/BANDAGES/DRESSINGS) ×2
AGENT HMST SPONGE THK3/8 (HEMOSTASIS)
ARMBAND PINK RESTRICT EXTREMIT (MISCELLANEOUS) ×2 IMPLANT
CANISTER SUCT 3000ML PPV (MISCELLANEOUS) ×2 IMPLANT
CLIP VESOCCLUDE MED 24/CT (CLIP) ×2 IMPLANT
CLIP VESOCCLUDE SM WIDE 24/CT (CLIP) ×2 IMPLANT
COVER PROBE W GEL 5X96 (DRAPES) ×2 IMPLANT
DECANTER SPIKE VIAL GLASS SM (MISCELLANEOUS) ×2 IMPLANT
DERMABOND ADVANCED (GAUZE/BANDAGES/DRESSINGS) ×2
DERMABOND ADVANCED .7 DNX12 (GAUZE/BANDAGES/DRESSINGS) ×1 IMPLANT
ELECT REM PT RETURN 9FT ADLT (ELECTROSURGICAL) ×2
ELECTRODE REM PT RTRN 9FT ADLT (ELECTROSURGICAL) ×1 IMPLANT
GLOVE BIO SURGEON STRL SZ7.5 (GLOVE) ×2 IMPLANT
GLOVE BIOGEL PI IND STRL 6.5 (GLOVE) IMPLANT
GLOVE BIOGEL PI IND STRL 8 (GLOVE) ×1 IMPLANT
GLOVE BIOGEL PI INDICATOR 6.5 (GLOVE) ×2
GLOVE BIOGEL PI INDICATOR 8 (GLOVE) ×1
GLOVE ECLIPSE 6.5 STRL STRAW (GLOVE) ×1 IMPLANT
GLOVE ECLIPSE 8.0 STRL XLNG CF (GLOVE) ×2 IMPLANT
GOWN STRL NON-REIN LRG LVL3 (GOWN DISPOSABLE) ×1 IMPLANT
GOWN STRL REUS W/ TWL LRG LVL3 (GOWN DISPOSABLE) ×2 IMPLANT
GOWN STRL REUS W/ TWL XL LVL3 (GOWN DISPOSABLE) ×2 IMPLANT
GOWN STRL REUS W/TWL LRG LVL3 (GOWN DISPOSABLE) ×4
GOWN STRL REUS W/TWL XL LVL3 (GOWN DISPOSABLE) ×4
HEMOSTAT SPONGE AVITENE ULTRA (HEMOSTASIS) IMPLANT
KIT BASIN OR (CUSTOM PROCEDURE TRAY) ×2 IMPLANT
KIT TURNOVER KIT B (KITS) ×2 IMPLANT
NDL HYPO 25GX1X1/2 BEV (NEEDLE) ×1 IMPLANT
NEEDLE HYPO 25GX1X1/2 BEV (NEEDLE) ×2 IMPLANT
NS IRRIG 1000ML POUR BTL (IV SOLUTION) ×2 IMPLANT
PACK CV ACCESS (CUSTOM PROCEDURE TRAY) ×2 IMPLANT
PAD ARMBOARD 7.5X6 YLW CONV (MISCELLANEOUS) ×4 IMPLANT
SUT MNCRL AB 4-0 PS2 18 (SUTURE) ×3 IMPLANT
SUT PROLENE 5 0 C 1 24 (SUTURE) ×3 IMPLANT
SUT PROLENE 6 0 BV (SUTURE) ×4 IMPLANT
SUT PROLENE 7 0 BV 1 (SUTURE) IMPLANT
SUT SILK 2 0 SH (SUTURE) ×1 IMPLANT
SUT VIC AB 2-0 CT1 27 (SUTURE) ×2
SUT VIC AB 2-0 CT1 TAPERPNT 27 (SUTURE) ×1 IMPLANT
SUT VIC AB 3-0 SH 27 (SUTURE) ×6
SUT VIC AB 3-0 SH 27X BRD (SUTURE) ×2 IMPLANT
TOWEL GREEN STERILE (TOWEL DISPOSABLE) ×2 IMPLANT
UNDERPAD 30X30 (UNDERPADS AND DIAPERS) ×2 IMPLANT
WATER STERILE IRR 1000ML POUR (IV SOLUTION) ×2 IMPLANT

## 2019-03-11 NOTE — Op Note (Signed)
    OPERATIVE NOTE   PROCEDURE: left second stage basilic vein transposition (brachiobasilic arteriovenous fistula) placement  PRE-OPERATIVE DIAGNOSIS: Stage IV CKD  POST-OPERATIVE DIAGNOSIS: Same  SURGEON: [name], MD  ASSISTANT(S): Risa Grill, MD  ANESTHESIA: LMA  ESTIMATED BLOOD LOSS: Minimal  FINDING(S): Transposition of left basilic vein with excellent thrill upon completion.  SPECIMEN(S):  None  INDICATIONS:   [name] is a 54 y.o. male who presents with stage IV CKD and needs permanent hemodialysis access.  The patient is scheduled for left second stage basilic vein transposition.  The patient is aware the risks include but are not limited to: bleeding, infection, steal syndrome, nerve damage, ischemic monomelic neuropathy, failure to mature, and need for additional procedures.  The patient is aware of the risks of the procedure and elects to proceed forward.   DESCRIPTION: After full informed written consent was obtained from the patient, the patient was brought back to the operating room and placed supine upon the operating table.  Prior to induction, the patient received IV antibiotics.   After obtaining adequate anesthesia, the patient was then prepped and draped in the standard fashion for a left arm access procedure.  I turned my attention first to identifying the patient's brachiobasilic arteriovenous fistula.  Using SonoSite guidance, the location of this fistula was marked out on the skin.    This was an excellent caliber vein.  I made three longitudinal incisions on the medial aspect of the left upper arm.  Through these incisions I dissected out circumferentially the basilic vein, taking care to protect the nerve.  Once the vein was fully mobilized, all side branches were ligated between silk ties.  The vein was marked for orientation.  I then used a curved tunneler to create a subcutaneous tunnel.  The vein was then transected near the antecubital  crease.  It was then brought to the previously created tunnel making sure to maintain proper orientation.  A primary anastomosis was then performed between the two cut ends of the vein with a running 5-0 Prolene.  Once this was done the clamps were released.  There was excellent flow through the fistula.  Hemostasis was then achieved.  The wound was irrigated.  The incisions were closed with a deep layer of 3-0 Vicryl followed by a subcutaneous 4-0 Monocryl and Dermabond.  There were no immediate complications.  COMPLICATIONS: None  CONDITION: Stable  [name], MD [facility]: [phone]  03/11/2019, 12:25 PM

## 2019-03-11 NOTE — Discharge Instructions (Signed)
General Anesthesia, Adult, Care After This sheet gives you information about how to care for yourself after your procedure. Your health care provider may also give you more specific instructions. If you have problems or questions, contact your health care provider. What can I expect after the procedure? After the procedure, the following side effects are common:  Pain or discomfort at the IV site.  Nausea.  Vomiting.  Sore throat.  Trouble concentrating.  Feeling cold or chills.  Weak or tired.  Sleepiness and fatigue.  Soreness and body aches. These side effects can affect parts of the body that were not involved in surgery. Follow these instructions at home:  For at least 24 hours after the procedure:  Have a responsible adult stay with you. It is important to have someone help care for you until you are awake and alert.  Rest as needed.  Do not: ? Participate in activities in which you could fall or become injured. ? Drive. ? Use heavy machinery. ? Drink alcohol. ? Take sleeping pills or medicines that cause drowsiness. ? Make important decisions or sign legal documents. ? Take care of children on your own. Eating and drinking  Follow any instructions from your health care provider about eating or drinking restrictions.  When you feel hungry, start by eating small amounts of foods that are soft and easy to digest (bland), such as toast. Gradually return to your regular diet.  Drink enough fluid to keep your urine pale yellow.  If you vomit, rehydrate by drinking water, juice, or clear broth. General instructions  If you have sleep apnea, surgery and certain medicines can increase your risk for breathing problems. Follow instructions from your health care provider about wearing your sleep device: ? Anytime you are sleeping, including during daytime naps. ? While taking prescription pain medicines, sleeping medicines, or medicines that make you drowsy.  Return to  your normal activities as told by your health care provider. Ask your health care provider what activities are safe for you.  Take over-the-counter and prescription medicines only as told by your health care provider.  If you smoke, do not smoke without supervision.  Keep all follow-up visits as told by your health care provider. This is important. Contact a health care provider if:  You have nausea or vomiting that does not get better with medicine.  You cannot eat or drink without vomiting.  You have pain that does not get better with medicine.  You are unable to pass urine.  You develop a skin rash.  You have a fever.  You have redness around your IV site that gets worse. Get help right away if:  You have difficulty breathing.  You have chest pain.  You have blood in your urine or stool, or you vomit blood. Summary  After the procedure, it is common to have a sore throat or nausea. It is also common to feel tired.  Have a responsible adult stay with you for the first 24 hours after general anesthesia. It is important to have someone help care for you until you are awake and alert.  When you feel hungry, start by eating small amounts of foods that are soft and easy to digest (bland), such as toast. Gradually return to your regular diet.  Drink enough fluid to keep your urine pale yellow.  Return to your normal activities as told by your health care provider. Ask your health care provider what activities are safe for you. This information is not   intended to replace advice given to you by your health care provider. Make sure you discuss any questions you have with your health care provider. Document Revised: 02/03/2017 Document Reviewed: 09/16/2016 Elsevier Patient Education  2020 [employer] Inc[employer]  

## 2019-03-11 NOTE — Anesthesia Preprocedure Evaluation (Addendum)
Anesthesia Evaluation  Patient identified by MRN, date of birth, ID band Patient awake    Reviewed: Allergy & Precautions, H&P , NPO status , Patient's Chart, lab work & pertinent test results, reviewed documented beta blocker date and time   History of Anesthesia Complications (+) PONV and history of anesthetic complications  Airway Mallampati: III  TM Distance: >3 FB Neck ROM: Full    Dental no notable dental hx. (+) Poor Dentition, Dental Advisory Given   Pulmonary sleep apnea ,    Pulmonary exam normal breath sounds clear to auscultation       Cardiovascular hypertension, Pt. on medications and Pt. on home beta blockers  Rhythm:Regular Rate:Normal     Neuro/Psych  Headaches, negative psych ROS   GI/Hepatic Neg liver ROS, PUD,   Endo/Other  Morbid obesity  Renal/GU ESRF and DialysisRenal disease  negative genitourinary   Musculoskeletal   Abdominal   Peds  Hematology  (+) Blood dyscrasia, anemia ,   Anesthesia Other Findings   Reproductive/Obstetrics negative OB ROS                            Anesthesia Physical Anesthesia Plan  ASA: III  Anesthesia Plan: General   Post-op Pain Management:    Induction: Intravenous  PONV Risk Score and Plan: 4 or greater and Ondansetron, Dexamethasone and Midazolam  Airway Management Planned: LMA  Additional Equipment:   Intra-op Plan:   Post-operative Plan: Extubation in OR  Informed Consent: I have reviewed the patients History and Physical, chart, labs and discussed the procedure including the risks, benefits and alternatives for the proposed anesthesia with the patient or authorized representative who has indicated his/her understanding and acceptance.     Dental advisory given  Plan Discussed with: CRNA  Anesthesia Plan Comments:         Anesthesia Quick Evaluation

## 2019-03-11 NOTE — Transfer of Care (Signed)
Immediate Anesthesia Transfer of Care Note  Patient: [name]  Procedure(s) Performed: BASILIC VEIN TRANSPOSITION 2ND STAGE LEFT (Left )  Patient Location: PACU  Anesthesia Type:General  Level of Consciousness: awake and alert   Airway & Oxygen Therapy: Patient Spontanous Breathing and Patient connected to face mask oxygen  Post-op Assessment: Report given to RN and Post -op Vital signs reviewed and stable  Post vital signs: Reviewed and stable  Last Vitals:  Vitals Value Taken Time  BP 182/107 03/11/19 1200  Temp    Pulse 106 03/11/19 1201  Resp 22 03/11/19 1201  SpO2 96 % 03/11/19 1201  Vitals shown include unvalidated device data.  Last Pain:  Vitals:   03/11/19 0724  TempSrc:   PainSc: 0-No pain      Patients Stated Pain Goal: 3 (28/11/88 6773)  Complications: No apparent anesthesia complications

## 2019-03-11 NOTE — Progress Notes (Signed)
Interpretor at bedside. 

## 2019-03-11 NOTE — Anesthesia Procedure Notes (Signed)
Procedure Name: LMA Insertion Date/Time: 03/11/2019 9:46 AM Performed by: Inda Coke, CRNA Pre-anesthesia Checklist: Patient identified, Emergency Drugs available, Suction available and Patient being monitored Patient Re-evaluated:Patient Re-evaluated prior to induction Oxygen Delivery Method: Circle System Utilized Preoxygenation: Pre-oxygenation with 100% oxygen Induction Type: IV induction LMA: LMA with gastric port inserted LMA Size: 4.0 Number of attempts: 1 Airway Equipment and Method: Bite block Placement Confirmation: positive ETCO2 Tube secured with: Tape Dental Injury: Teeth and Oropharynx as per pre-operative assessment

## 2019-03-11 NOTE — H&P (Signed)
History and Physical Interval Note:  03/11/2019 9:07 AM  [name]  has presented today for surgery, with the diagnosis of CHRONIC KIDNEY DISEASE STAGE 4.  The various methods of treatment have been discussed with the patient and family. After consideration of risks, benefits and other options for treatment, the patient has consented to  Procedure(s): [place] LEFT (Left) as a surgical intervention.  The patient's history has been reviewed, patient examined, no change in status, stable for surgery.  I have reviewed the patient's chart and labs.  Questions were answered to the patient's satisfaction.    Left 2nd stage BVT  [name]  Patient name: [name] MRN: [id number] DOB: 24-Feb-1965 Sex: male  REASON FOR VISIT: Follow-up after left 1st stage basilic vein fistula  HPI:  [name] is a 54 y.o. male with stage IV chronic kidney disease that presents for postop check after left first stage basilic vein transposition on 01/04/2019. Patient denies any numbness or tingling in the left hand. He states he can feel a good thrill in the fistula. Incision has healed without issue. He has not started dialysis at this time. States he is due to see his kidney doctor again in 1 week.      Past Medical History:  Diagnosis Date  . Anemia   . Back pain   . CKD (chronic kidney disease), stage III    Stage 4  . Colon polyps    adenomatous  . Elevated cholesterol   . History of echocardiogram    Echo 11/17: EF 65-70, normal wall motion, grade 1 diastolic dysfunction, trivial MR, mild LAE, mild TR  . Hyperlipidemia   . Hypertension    no current bp meds for last 3 months  . Kidney stones 2007  . Medication intolerance    a. multiple with prior nonadherence to regimen.  . Otosclerosis of both ears   . Pneumonia   . PUD (peptic ulcer disease)    Has had unspecified surgery for this  . Sleep apnea         Past Surgical History:  Procedure Laterality Date  .  AV FISTULA PLACEMENT Left 01/04/2019   Procedure: ARTERIOVENOUS (AV) FISTULA CREATION LEFT ARM; Surgeon: [name], MD; Location: [facility]; Service: Vascular; Laterality: Left;  . NEPHRECTOMY  02/18/2011   Procedure: NEPHRECTOMY; Surgeon: Hanley Ben, MD; Location: [facility]; Service: Urology; Laterality: Right;  . SMALL INTESTINE SURGERY    . STAPEDOTOMY  2005   lt ear jan, right ear sept  . surgery for ulcers  1990        Family History  Problem Relation Age of Onset  . Hyperlipidemia Father   . Heart disease Mother   . Hypertension Mother   . Hypertension Sister   . Heart disease Brother 24   CABG  . Hyperlipidemia Sister   . Hyperlipidemia Sister   . Esophageal cancer Cousin   . Healthy Child   . Colon cancer Neg Hx   . Stomach cancer Neg Hx   . Rectal cancer Neg Hx    SOCIAL HISTORY:  Social History       Tobacco Use  . Smoking status: Never Smoker  . Smokeless tobacco: Never Used  Substance Use Topics  . Alcohol use: No        Allergies  Allergen Reactions  . Beef-Derived Products Other (See Comments)    Cultural preference  . Pegademase Bovine Other (See Comments)    Cultural preference  .  Poractant Alfa Other (See Comments)    Cultural preference  . Pork-Derived Products Other (See Comments)    Cultural preference         Current Outpatient Medications  Medication Sig Dispense Refill  . acetaminophen (TYLENOL) 500 MG tablet Take 1 tablet (500 mg total) by mouth every 6 (six) hours as needed for moderate pain. 30 tablet 0  . azithromycin (ZITHROMAX) 250 MG tablet Take 2 tablets today, then one tablet each day after. 6 tablet 0  . calcitRIOL (ROCALTROL) 0.25 MCG capsule Take 0.25 mcg by mouth daily.  11  . calcium acetate (PHOSLO) 667 MG capsule Take 2 capsules (1,334 mg total) by mouth 3 (three) times daily with meals. 180 capsule 3  . cholecalciferol (VITAMIN D) 25 MCG (1000 UT) tablet Take 1,000 Units by mouth daily.    . furosemide (LASIX) 40  MG tablet Take 1 tablet (40 mg total) by mouth daily. 90 tablet 0  . HYDROcodone-acetaminophen (NORCO/VICODIN) 5-325 MG tablet Take 1 tablet by mouth every 6 (six) hours as needed for moderate pain. 15 tablet 0  . Icosapent Ethyl 1 g CAPS Take 2 capsules (2 g total) by mouth 2 (two) times daily. 120 capsule 11  . labetalol (NORMODYNE) 200 MG tablet Take 2 tablets (400 mg total) by mouth 2 (two) times daily. 120 tablet 0  . rosuvastatin (CRESTOR) 10 MG tablet Take 10 mg by mouth daily.    . sodium zirconium cyclosilicate (LOKELMA) 10 g PACK packet Take 10 g by mouth.    . Vitamin D, Ergocalciferol, (DRISDOL) 1.25 MG (50000 UT) CAPS capsule Take 1 capsule (50,000 Units total) by mouth every 7 (seven) days. 4 capsule 6   No current facility-administered medications for this visit.   REVIEW OF SYSTEMS:  [X]  denotes positive finding, [ ]  denotes negative finding  Cardiac  Comments:  Chest pain or chest pressure:    Shortness of breath upon exertion:    Short of breath when lying flat:    Irregular heart rhythm:        Vascular    Pain in calf, thigh, or hip brought on by ambulation:    Pain in feet at night that wakes you up from your sleep:     Blood clot in your veins:    Leg swelling:         Pulmonary    Oxygen at home:    Productive cough:     Wheezing:         Neurologic    Sudden weakness in arms or legs:     Sudden numbness in arms or legs:     Sudden onset of difficulty speaking or slurred speech:    Temporary loss of vision in one eye:     Problems with dizziness:         Gastrointestinal    Blood in stool:     Vomited blood:         Genitourinary    Burning when urinating:     Blood in urine:        Psychiatric    Major depression:         Hematologic    Bleeding problems:    Problems with blood clotting too easily:        Skin    Rashes or ulcers:        Constitutional    Fever or chills:    PHYSICAL EXAM:     Vitals:   02/19/19 0818  BP: (!) 166/85   Pulse: 88  Resp: 18  Temp: (!) 97.3 F (36.3 C)  TempSrc: Temporal  SpO2: 100%  Weight: 245 lb (111.1 kg)  Height: 5\' 9"  (1.753 m)   GENERAL: The patient is a well-nourished male, in no acute distress. The vital signs are documented above.  CARDIAC: There is a regular rate and rhythm.  VASCULAR:  Left basilic vein fistula with good thrill  Left radial pulse palpable  Left arm incision well healed  DATA:  I independently reviewed left upper extremity AV fistula duplex and widely patent basilic vein fistula after first stage with good flow volumes  Assessment/Plan:  54 year old male with stage IV chronic kidney disease that presents for postop check after left first stage basilic vein fistula on 75/44/9201. He has an excellent thrill in the fistula with a palpable radial pulse at the wrist. Incision has healed without issue. Fistula looks widely patent and and matured nicely based on fistula duplex today. Discussed plans for second stage basilic vein fistula. We will schedule for next available date. Surgery details discussed with patient as well as risks and benefits.  [name], MD  [facility] [place]  Office: [phone]  Pager: [phone]

## 2019-03-11 NOTE — Anesthesia Postprocedure Evaluation (Signed)
Anesthesia Post Note  Patient: [name]  Procedure(s) Performed: BASILIC VEIN TRANSPOSITION 2ND STAGE LEFT (Left )     Patient location during evaluation: PACU Anesthesia Type: General Level of consciousness: awake and alert Pain management: pain level controlled Vital Signs Assessment: post-procedure vital signs reviewed and stable Respiratory status: spontaneous breathing, nonlabored ventilation and respiratory function stable Cardiovascular status: blood pressure returned to baseline and stable Postop Assessment: no apparent nausea or vomiting Anesthetic complications: no    Last Vitals:  Vitals:   03/11/19 1300 03/11/19 1312  BP: (!) 149/97 (!) 163/87  Pulse: 81 84  Resp: 15 14  Temp:  37 C  SpO2: 96% 95%    Last Pain:  Vitals:   03/11/19 1312  TempSrc:   PainSc: Asleep                 Ricka Westra,W. EDMOND

## 2019-03-12 ENCOUNTER — Ambulatory Visit (INDEPENDENT_AMBULATORY_CARE_PROVIDER_SITE_OTHER): Payer: 59 | Admitting: Internal Medicine

## 2019-03-12 VITALS — BP 179/94 | HR 100 | Temp 98.1°F | Ht 69.0 in | Wt 245.8 lb

## 2019-03-12 DIAGNOSIS — E781 Pure hyperglyceridemia: Secondary | ICD-10-CM | POA: Diagnosis not present

## 2019-03-12 DIAGNOSIS — E785 Hyperlipidemia, unspecified: Secondary | ICD-10-CM | POA: Diagnosis not present

## 2019-03-12 NOTE — Progress Notes (Addendum)
LIPID CLINIC CONSULT NOTE  Chief Complaint:  Follow-up hyperchylomicronemia  Primary Care Physician: Forrest Moron, MD  Primary Cardiologist:  Ena Dawley, MD  HPI:  [name] is a 54 y.o. male who is being seen today for the evaluation of dyslipidemia at the request of Forrest Moron, MD.  This is a pleasant 54 year old Arabic male who was seen today with a Psychologist, sport and exercise.  His past medical history significant for chronic kidney disease which is stage IV and currently a GFR of around 10.  He has been followed by [name] with nephrology and has an appointment in October to see Dr. Carlis Abbott with vascular surgery for creation of a fistula.  He has had longstanding dyslipidemia and multiple medication intolerances.  He was previously followed in the [facility] lipid clinic by [name], PharmD.  When he was last seen in November 2019, he was on rosuvastatin 10 mg daily and Lovaza 2 g twice daily.  He was reportedly intolerance to atorvastatin 80 mg causing myalgias and fenofibrate 160 mg causing myalgias.  I reviewed the medicine list with him today and looked at all the medications that were in his possession.  After working with the translator we determined that he was still taking the rosuvastatin 10 mg daily and also fenofibrate 160 mg daily.  He did not have any of the Lovaza.  Previously he was also evaluated apparently for Vascepa but felt to be unable to obtain that due to financial constraints.  He is working and does have [employer] but is in application for disability.  There is a strong family history of coronary disease in multiple family members.  His only other complaint today is some shortness of breath and next edema.  Is difficult to know whether shortness of breath may be related to advanced chronic kidney disease or coronary disease.  Given his advanced creatinine, it is unlikely that he could have any dye related cardiovascular work-up.  His last stress  test was in 2015.  His most recent lipids from August 24, 2018 showed total cholesterol 313, triglycerides 1340, HDL 24 and direct LDL of 63.  03/12/2019  [name] returns today for follow-up of hypertriglyceridemia.  He has been taking Vascepa 2 g twice daily.  This has caused a resultant decrease in his triglycerides from 1340-741.  Although this is still elevated, represents a significant reduction.  It is difficult to get his triglycerides much lower given his underlying renal disease.  Unfortunately he was not a candidate for a fibrate.  He reports other medication intolerances.  With this medicine he has says through the translator that he thinks he is having headaches and possibly some abdominal pain.  I advised him to decrease the dose to 1 g twice daily, although not as effective, it may have less chance for side effect.  If this is better tolerated he should continue it and if not then he will have no choice but to stop it.  I do not see any other options for lowering triglycerides at this time other than strict diet a very low saturated fats less than 5 to 10% of calories.  I provided him with this information today including dietary recommendations.  PMHx:  Past Medical History:  Diagnosis Date  . Anemia   . Back pain   . CKD (chronic kidney disease), stage III    Stage 4  . Colon polyps    adenomatous  . Elevated cholesterol   .  History of echocardiogram    Echo 11/17: EF 65-70, normal wall motion, grade 1 diastolic dysfunction, trivial MR, mild LAE, mild TR  . Hyperlipidemia   . Hypertension    no current bp meds for last 3 months  . Kidney stones 2007  . Medication intolerance    a. multiple with prior nonadherence to regimen.  . Nausea    " in the morning since having the kidney problem "  . Otosclerosis of both ears   . Pneumonia   . PONV (postoperative nausea and vomiting)   . PUD (peptic ulcer disease)    Has had unspecified surgery for this  . Sleep apnea     Past  Surgical History:  Procedure Laterality Date  . AV FISTULA PLACEMENT Left 01/04/2019   Procedure: ARTERIOVENOUS (AV) FISTULA CREATION LEFT ARM;  Surgeon: [name], MD;  Location: [place];  Service: Vascular;  Laterality: Left;  . BASCILIC VEIN TRANSPOSITION Left 03/11/2019   Procedure: BASILIC VEIN TRANSPOSITION 2ND STAGE LEFT;  Surgeon: [name], MD;  Location: [place];  Service: Vascular;  Laterality: Left;  . NEPHRECTOMY  02/18/2011   Procedure: NEPHRECTOMY;  Surgeon: Hanley Ben, MD;  Location: [facility];  Service: Urology;  Laterality: Right;  . SMALL INTESTINE SURGERY    . STAPEDOTOMY  2005   lt ear jan, right ear sept  . surgery for ulcers  1990    FAMHx:  Family History  Problem Relation Age of Onset  . Hyperlipidemia Father   . Heart disease Mother   . Hypertension Mother   . Hypertension Sister   . Heart disease Brother 44       CABG  . Hyperlipidemia Sister   . Hyperlipidemia Sister   . Esophageal cancer Cousin   . Healthy Child   . Colon cancer Neg Hx   . Stomach cancer Neg Hx   . Rectal cancer Neg Hx     SOCHx:   reports that he has never smoked. He has never used smokeless tobacco. He reports that he does not drink alcohol or use drugs.  ALLERGIES:  Allergies  Allergen Reactions  . Lokelma [Sodium Zirconium Cyclosilicate] Shortness Of Breath  . Beef-Derived Products Other (See Comments)    Cultural preference  . Pegademase Bovine Other (See Comments)    Cultural preference  . Poractant Alfa Other (See Comments)    Cultural preference  . Pork-Derived Products Other (See Comments)    Cultural preference    ROS: Pertinent items noted in HPI and remainder of comprehensive ROS otherwise negative.  HOME MEDS: Current Outpatient Medications on File Prior to Visit  Medication Sig Dispense Refill  . calcitRIOL (ROCALTROL) 0.25 MCG capsule Take 0.25 mcg by mouth daily.  11  . calcium acetate (PHOSLO) 667 MG capsule Take 2 capsules (1,334  mg total) by mouth 3 (three) times daily with meals. 180 capsule 3  . furosemide (LASIX) 40 MG tablet Take 1 tablet (40 mg total) by mouth daily. 90 tablet 0  . HYDROcodone-acetaminophen (NORCO/VICODIN) 5-325 MG tablet Take 1 tablet by mouth every 4 (four) hours as needed for moderate pain. 15 tablet 0  . Icosapent Ethyl 1 g CAPS Take 2 capsules (2 g total) by mouth 2 (two) times daily. 120 capsule 11  . labetalol (NORMODYNE) 200 MG tablet Take 2 tablets (400 mg total) by mouth 2 (two) times daily. 120 tablet 0   No current facility-administered medications on file prior to visit.    LABS/IMAGING: Results for  orders placed or performed during the hospital encounter of 03/11/19 (from the past 48 hour(s))  I-STAT, chem 8     Status: Abnormal   Collection Time: 03/11/19  7:29 AM  Result Value Ref Range   Sodium 140 135 - 145 mmol/L   Potassium 5.6 (H) 3.5 - 5.1 mmol/L   Chloride 112 (H) 98 - 111 mmol/L   BUN 82 (H) 6 - 20 mg/dL   Creatinine, Ser 8.90 (H) 0.61 - 1.24 mg/dL   Glucose, Bld 101 (H) 70 - 99 mg/dL   Calcium, Ion 0.94 (L) 1.15 - 1.40 mmol/L   TCO2 18 (L) 22 - 32 mmol/L   Hemoglobin 9.2 (L) 13.0 - 17.0 g/dL   HCT 27.0 (L) 39.0 - 52.0 %   No results found.  LIPID PANEL:    Component Value Date/Time   CHOL 264 (H) 03/01/2019 1237   TRIG 741 (HH) 03/01/2019 1237   HDL 23 (L) 03/01/2019 1237   CHOLHDL 11.5 (H) 03/01/2019 1237   CHOLHDL 5.3 (H) 12/09/2015 1328   VLDL NOT CALC 12/09/2015 1328   LDLCALC 109 (H) 03/01/2019 1237   LDLDIRECT 63 08/24/2018 1458   LDLDIRECT 139.0 05/06/2014 1209    WEIGHTS: Wt Readings from Last 3 Encounters:  03/12/19 245 lb 12.8 oz (111.5 kg)  03/11/19 245 lb (111.1 kg)  02/19/19 245 lb (111.1 kg)    VITALS: BP (!) 179/94   Pulse 100   Temp 98.1 F (36.7 C)   Ht 5\' 9"  (1.753 m)   Wt 245 lb 12.8 oz (111.5 kg)   SpO2 99%   BMI 36.30 kg/m   EXAM: Deferred  EKG: Deferred  ASSESSMENT: 1. Secondary hypertriglyceridemia  secondary to advanced kidney disease 2. Progressive dyspnea on exertion, ? coronary equivalent  PLAN: 1.   [name] has been taking Vascepa with significant improvement in his triglycerides however feels that he may not be able to tolerate the medicine has been having headaches and some other issues.  I advised him to decrease the dose to 1 g twice daily.  If this is improved he should remain on it and if it is not tolerated then he will have no choice but to stop the medicine.  There are no other options to significantly lower his triglycerides at this point.  I advised to strict very low-fat diet and provided him with meal recommendations from the National lipid Association.  Pixie Casino, MD, [facility], [place] Director of the Advanced Lipid Disorders &  Cardiovascular Risk Reduction Clinic Diplomate of the American Board of Clinical Lipidology Attending Cardiologist  Direct Dial: [phone]  Fax: [phone]  Website:  [email or web address] [email or web address] Waneda Klammer 03/12/2019, 11:02 AM

## 2019-03-12 NOTE — Patient Instructions (Signed)
Medication Instructions:  DECREASE vascepa to 1 gram (1 capsule) twice daily -- please notify us if you tolerate this dose  *If you need a refill on your cardiac medications before your next appointment, please call your pharmacy*  Lab Work: FASTING lab work in 6 months to check cholesterol   If you have labs (blood work) drawn today and your tests are completely normal, you will receive your results only by: Marland Kitchen MyChart Message (if you have MyChart) OR . A paper copy in the mail If you have any lab test that is abnormal or we need to change your treatment, we will call you to review the results.  Testing/Procedures: NONE  Follow-Up: At [facility], you and your health needs are our priority.  As part of our continuing mission to provide you with exceptional heart care, we have created designated Provider Care Teams.  These Care Teams include your primary Cardiologist (physician) and Advanced Practice Providers (APPs -  Physician Assistants and Nurse Practitioners) who all work together to provide you with the care you need, when you need it.  Your next appointment:   12 months  The format for your next appointment:   In Person  Provider:   K. Mali Hilty, MD  Other Instructions

## 2019-03-14 ENCOUNTER — Ambulatory Visit: Payer: PRIVATE HEALTH INSURANCE | Admitting: Internal Medicine

## 2019-03-15 ENCOUNTER — Telehealth: Payer: Self-pay

## 2019-03-15 ENCOUNTER — Ambulatory Visit: Payer: BLUE CROSS/BLUE SHIELD | Admitting: Family Medicine

## 2019-03-15 NOTE — Telephone Encounter (Signed)
Pts daughter called and said that he is having a lot of swelling in his arm and that it is red as well.   Called pt - difficulty understanding him, asked if daughter was there to speak with and he said no. We discussed the swelling and he denies any fever at this time. Advised that he needed to use elevation above his heart level and squeeze his hand as if he was gripping a stress ball. Advised him to do this as much as possible to help with the swelling.   Advised if it got worse through the weekend to go to the ER for evaluation or to call on Monday for an appt.   York Cerise, CMA

## 2019-03-18 ENCOUNTER — Ambulatory Visit: Payer: BLUE CROSS/BLUE SHIELD | Admitting: Family Medicine

## 2019-03-19 MED FILL — SPS 15 GM/60 ML SUSPENSION: 15 | 1 days supply | Qty: 120 | Fill #0

## 2019-03-20 ENCOUNTER — Other Ambulatory Visit: Payer: Self-pay

## 2019-03-20 ENCOUNTER — Ambulatory Visit (INDEPENDENT_AMBULATORY_CARE_PROVIDER_SITE_OTHER): Payer: 59 | Admitting: Gastroenterology

## 2019-03-20 ENCOUNTER — Encounter: Payer: Self-pay | Admitting: Gastroenterology

## 2019-03-20 VITALS — BP 170/100 | HR 96 | Temp 99.1°F | Ht 69.0 in | Wt 244.0 lb

## 2019-03-20 DIAGNOSIS — R1011 Right upper quadrant pain: Secondary | ICD-10-CM

## 2019-03-20 DIAGNOSIS — K219 Gastro-esophageal reflux disease without esophagitis: Secondary | ICD-10-CM

## 2019-03-20 DIAGNOSIS — R110 Nausea: Secondary | ICD-10-CM

## 2019-03-20 DIAGNOSIS — R109 Unspecified abdominal pain: Secondary | ICD-10-CM | POA: Diagnosis not present

## 2019-03-20 DIAGNOSIS — R140 Abdominal distension (gaseous): Secondary | ICD-10-CM | POA: Insufficient documentation

## 2019-03-20 DIAGNOSIS — Z86010 Personal history of colonic polyps: Secondary | ICD-10-CM | POA: Diagnosis not present

## 2019-03-20 DIAGNOSIS — Z01818 Encounter for other preprocedural examination: Secondary | ICD-10-CM | POA: Insufficient documentation

## 2019-03-20 MED ORDER — SUPREP BOWEL PREP KIT 17.5-3.13-1.6 GM/177ML PO SOLN: 1.0000 | ORAL | 0 refills | Status: DC

## 2019-03-20 MED ORDER — PANTOPRAZOLE SODIUM 40 MG PO TBEC: 40.0000 mg | DELAYED_RELEASE_TABLET | Freq: Every day | ORAL | 5 refills | Status: DC

## 2019-03-20 NOTE — Progress Notes (Signed)
03/20/2019 [name] [id number] Nov 21, 1965   HISTORY OF PRESENT ILLNESS: This is a pleasant 54 year old [place] male who is here today with complaints of abdominal pain somewhat generalized, but more so in the right upper quadrant.  Also reports generalized abdominal bloating with nausea and vomiting mostly in the mornings.  He had a recent CT scan abdomen and pelvis without contrast that showed prior right nephrectomy and small right inguinal hernia containing only fat, but no other findings to account for his symptoms.  Gallbladder appeared unremarkable.  It appears that he has had some similar symptoms in the past and actually underwent EGD in August 2017 by Dr. Henrene Pastor that showed only small duodenal erosion, but H. pylori negative.  He does have a more remote history of a clean-based duodenal ulcer in 2002 that was secondary to NSAIDs and H. pylori found on biopsy at that time.  No longer using NSAIDs.  Not currently on any acid suppression medications.  Does report some acid reflux as well.  Last colonoscopy 09/2015 showed the following:  - Three 2 to 5 mm polyps in the ascending colon, removed with a cold snare. Resected and retrieved. - Internal hemorrhoids. - The examination was otherwise normal on direct and retroflexion views.  Polyps were adenomatous so repeat was recommended at 3 year interval.  Patient overdue for that.  Also complaining of headache.  Blood pressure is high and PCP is aware.  Patient speaks primarily Arabic so interpreter was present and utilized for the entirety of the visit.  Past Medical History:  Diagnosis Date  . Anemia   . Back pain   . CKD (chronic kidney disease), stage III    Stage 4  . Colon polyps    adenomatous  . Elevated cholesterol   . History of echocardiogram    Echo 11/17: EF 65-70, normal wall motion, grade 1 diastolic dysfunction, trivial MR, mild LAE, mild TR  . Hyperlipidemia   . Hypertension    no current bp meds for last 3  months  . Kidney stones 2007  . Medication intolerance    a. multiple with prior nonadherence to regimen.  . Nausea    " in the morning since having the kidney problem "  . Otosclerosis of both ears   . Pneumonia   . PONV (postoperative nausea and vomiting)   . PUD (peptic ulcer disease)    Has had unspecified surgery for this  . Sleep apnea    Past Surgical History:  Procedure Laterality Date  . AV FISTULA PLACEMENT Left 01/04/2019   Procedure: ARTERIOVENOUS (AV) FISTULA CREATION LEFT ARM;  Surgeon: [name], MD;  Location: [place];  Service: Vascular;  Laterality: Left;  . BASCILIC VEIN TRANSPOSITION Left 03/11/2019   Procedure: BASILIC VEIN TRANSPOSITION 2ND STAGE LEFT;  Surgeon: [name], MD;  Location: [place];  Service: Vascular;  Laterality: Left;  . NEPHRECTOMY  02/18/2011   Procedure: NEPHRECTOMY;  Surgeon: Hanley Ben, MD;  Location: [facility];  Service: Urology;  Laterality: Right;  . SMALL INTESTINE SURGERY    . STAPEDOTOMY  2005   lt ear jan, right ear sept  . surgery for ulcers  1990    reports that he has never smoked. He has never used smokeless tobacco. He reports that he does not drink alcohol or use drugs. family history includes Esophageal cancer in his cousin; Healthy in his child; Heart disease in his mother; Heart disease (age of onset: 2) in his brother;  Hyperlipidemia in his father, sister, and sister; Hypertension in his mother and sister. Allergies  Allergen Reactions  . Lokelma [Sodium Zirconium Cyclosilicate] Shortness Of Breath  . Beef-Derived Products Other (See Comments)    Cultural preference  . Pegademase Bovine Other (See Comments)    Cultural preference  . Poractant Alfa Other (See Comments)    Cultural preference  . Pork-Derived Products Other (See Comments)    Cultural preference      Outpatient Encounter Medications as of 03/20/2019  Medication Sig  . calcitRIOL (ROCALTROL) 0.25 MCG capsule Take 0.25 mcg by mouth  daily.  . calcium acetate (PHOSLO) 667 MG capsule Take 2 capsules (1,334 mg total) by mouth 3 (three) times daily with meals.  . furosemide (LASIX) 40 MG tablet Take 1 tablet (40 mg total) by mouth daily.  Marland Kitchen HYDROcodone-acetaminophen (NORCO/VICODIN) 5-325 MG tablet Take 1 tablet by mouth every 4 (four) hours as needed for moderate pain.  Marland Kitchen icosapent Ethyl (VASCEPA) 1 g capsule Take 1 g by mouth 2 (two) times daily.  Marland Kitchen labetalol (NORMODYNE) 200 MG tablet Take 2 tablets (400 mg total) by mouth 2 (two) times daily.   No facility-administered encounter medications on file as of 03/20/2019.     REVIEW OF SYSTEMS  : All other systems reviewed and negative except where noted in the History of Present Illness.   PHYSICAL EXAM: BP (!) 170/100 (BP Location: Right Arm, Patient Position: Sitting, Cuff Size: Normal)   Pulse 96   Temp 99.1 F (37.3 C)   Ht 5\' 9"  (1.753 m) Comment: height measured without shoes  Wt 244 lb (110.7 kg)   BMI 36.03 kg/m  General: Well developed male in no acute distress Head: Normocephalic and atraumatic Eyes:  Sclerae anicteric, conjunctiva pink. Ears: Normal auditory acuity Lungs: Clear throughout to auscultation; no increased WOB. Heart: Regular rate and rhythm; no M/R/G. Abdomen: Soft, non-distended.  BS present.  Mild upper abdominal TTP.  Large abdominal incisions noted, well healed. Rectal:  Will be done at the time of colonoscopy. Musculoskeletal: Symmetrical with no gross deformities  Skin: No lesions on visible extremities Extremities: No edema  Neurological: Alert oriented x 4, grossly non-focal Psychological:  Alert and cooperative. Normal mood and affect  ASSESSMENT AND PLAN: *Personal history of colon polyps:  Last colonoscopy 09/2015 with tubular adenomas removed.  Repeat recommended at 3 year interval.  Is overdue.  Will plan for that with Dr. Henrene Pastor. *GERD, RUQ abdominal pain, nausea, bloating:  I am going to place him on pantoprazole 40 mg daily as  he has not been on any type of acid suppression.  He had EGD in 2017 for similar indications and only found to have small duodenal erosion, H. pylori negative.  We will see how he does with the PPI, but I think that some of his symptoms sound biliary.  Gallbladder unremarkable on recent non-contrast CT scan and no other cause of his symptoms identified.  Will order HIDA scan with CCK to rule out biliary dysfunction.  **The risks, benefits, and alternatives to colonoscopy were discussed with the patient and he consents to proceed.    CC:  Forrest Moron, MD

## 2019-03-20 NOTE — Patient Instructions (Signed)
We have sent the following medications to your pharmacy for you to pick up at your convenience: Pantoprazole, suprep  You have been scheduled for a HIDA scan at [facility] (1st floor) on 04/05/19. Please arrive 15 minutes prior to your scheduled appointment at  5:63JS. Make certain not to have anything to eat or drink at least 6 hours prior to your test. Should this appointment date or time not work well for you, please call radiology scheduling at [phone].  _____________________________________________________________________ hepatobiliary (HIDA) scan is an imaging procedure used to diagnose problems in the liver, gallbladder and bile ducts. In the HIDA scan, a radioactive chemical or tracer is injected into a vein in your arm. The tracer is handled by the liver like bile. Bile is a fluid produced and excreted by your liver that helps your digestive system break down fats in the foods you eat. Bile is stored in your gallbladder and the gallbladder releases the bile when you eat a meal. A special nuclear medicine scanner (gamma camera) tracks the flow of the tracer from your liver into your gallbladder and small intestine.  During your HIDA scan  You'll be asked to change into a hospital gown before your HIDA scan begins. Your health care team will position you on a table, usually on your back. The radioactive tracer is then injected into a vein in your arm.The tracer travels through your bloodstream to your liver, where it's taken up by the bile-producing cells. The radioactive tracer travels with the bile from your liver into your gallbladder and through your bile ducts to your small intestine.You may feel some pressure while the radioactive tracer is injected into your vein. As you lie on the table, a special gamma camera is positioned over your abdomen taking pictures of the tracer as it moves through your body. The gamma camera takes pictures continually for about an hour. You'll need to  keep still during the HIDA scan. This can become uncomfortable, but you may find that you can lessen the discomfort by taking deep breaths and thinking about other things. Tell your health care team if you're uncomfortable. The radiologist will watch on a computer the progress of the radioactive tracer through your body. The HIDA scan may be stopped when the radioactive tracer is seen in the gallbladder and enters your small intestine. This typically takes about an hour. In some cases extra imaging will be performed if original images aren't satisfactory, if morphine is given to help visualize the gallbladder or if the medication CCK is given to look at the contraction of the gallbladder. This test typically takes 2 hours to complete. ________________________________________________________________________   Dennis Bast have been scheduled for a colonoscopy. Please follow written instructions given to you at your visit today.  Please pick up your prep supplies at the pharmacy within the next 1-3 days. If you use inhalers (even only as needed), please bring them with you on the day of your procedure.  Thank you for choosing me and [facility].  Janett Billow Zehr-PA

## 2019-03-21 ENCOUNTER — Encounter: Payer: Self-pay | Admitting: Neurology

## 2019-03-21 ENCOUNTER — Other Ambulatory Visit: Payer: Self-pay

## 2019-03-21 ENCOUNTER — Ambulatory Visit (HOSPITAL_COMMUNITY)
Admission: RE | Admit: 2019-03-21 | Discharge: 2019-03-21 | Disposition: A | Discharge status: Home / Self Care | Payer: 59 | Source: Ambulatory Visit | Attending: Nephrology | Admitting: Nephrology

## 2019-03-21 VITALS — BP 159/86 | HR 88 | Temp 95.4°F | Resp 18

## 2019-03-21 DIAGNOSIS — N184 Chronic kidney disease, stage 4 (severe): Secondary | ICD-10-CM | POA: Insufficient documentation

## 2019-03-21 LAB — IRON AND TIBC
Iron: 30 ug/dL — ABNORMAL LOW (ref 45–182)
Saturation Ratios: 12 % — ABNORMAL LOW (ref 17.9–39.5)
TIBC: 249 ug/dL — ABNORMAL LOW (ref 250–450)
UIBC: 219 ug/dL

## 2019-03-21 LAB — FERRITIN: Ferritin: 262 ng/mL (ref 24–336)

## 2019-03-21 LAB — POCT HEMOGLOBIN-HEMACUE: Hemoglobin: 7.4 g/dL — ABNORMAL LOW (ref 13.0–17.0)

## 2019-03-21 MED ORDER — EPOETIN ALFA-EPBX 10000 UNIT/ML IJ SOLN
10000.0000 [IU] | INTRAMUSCULAR | Status: DC
  Administered 2019-03-21: 10000 [IU] via SUBCUTANEOUS

## 2019-03-21 MED ORDER — EPOETIN ALFA-EPBX 10000 UNIT/ML IJ SOLN
INTRAMUSCULAR | Status: AC
  Filled 2019-03-21: qty 1

## 2019-03-21 NOTE — Progress Notes (Signed)
Notified Stacy at [facility] of HGB 7.4 today.  Patient denies any bleeding, black stool, no SOB.  Per Marzetta Board /Dr.WEbb  Will change oeder to retacrit 10000 Q week.

## 2019-03-27 ENCOUNTER — Other Ambulatory Visit: Payer: Self-pay | Admitting: Internal Medicine

## 2019-03-27 ENCOUNTER — Other Ambulatory Visit: Payer: Self-pay

## 2019-03-27 ENCOUNTER — Encounter (HOSPITAL_COMMUNITY)
Admission: RE | Admit: 2019-03-27 | Discharge: 2019-03-27 | Disposition: A | Discharge status: Home / Self Care | Payer: 59 | Source: Ambulatory Visit | Attending: Nephrology | Admitting: Nephrology

## 2019-03-27 ENCOUNTER — Encounter: Payer: Self-pay | Admitting: Gastroenterology

## 2019-03-27 ENCOUNTER — Telehealth: Payer: Self-pay | Admitting: *Deleted

## 2019-03-27 VITALS — BP 164/93 | HR 88 | Temp 96.7°F | Resp 18

## 2019-03-27 DIAGNOSIS — N184 Chronic kidney disease, stage 4 (severe): Secondary | ICD-10-CM | POA: Diagnosis not present

## 2019-03-27 LAB — POCT HEMOGLOBIN-HEMACUE: Hemoglobin: 8 g/dL — ABNORMAL LOW (ref 13.0–17.0)

## 2019-03-27 MED ORDER — EPOETIN ALFA-EPBX 10000 UNIT/ML IJ SOLN
10000.0000 [IU] | INTRAMUSCULAR | Status: DC
  Administered 2019-03-27: 15:00:00 10000 [IU] via SUBCUTANEOUS

## 2019-03-27 MED ORDER — EPOETIN ALFA-EPBX 10000 UNIT/ML IJ SOLN
INTRAMUSCULAR | Status: AC
  Filled 2019-03-27: qty 1

## 2019-03-27 NOTE — Telephone Encounter (Signed)
Received call from pt. And he said that he had a appointment for his kidney and that he will go right now to take his covid test.called [facility] to let Laurence Aly know that pt. Was coming to do covid  Test she said it will be ok she will just pull his sheet from yesterday. She informed me that we can get results as long as he is there before 3:30 pm.

## 2019-03-27 NOTE — Progress Notes (Signed)
Assessment and plan reviewed 

## 2019-03-27 NOTE — Telephone Encounter (Signed)
Call placed to pt through the language line ,message left in pt. Language.

## 2019-03-28 ENCOUNTER — Encounter: Payer: Self-pay | Admitting: Internal Medicine

## 2019-03-28 ENCOUNTER — Other Ambulatory Visit: Payer: Self-pay

## 2019-03-28 ENCOUNTER — Ambulatory Visit (AMBULATORY_SURGERY_CENTER): Payer: 59 | Admitting: Internal Medicine

## 2019-03-28 VITALS — BP 148/85 | HR 76 | Temp 96.6°F | Resp 17 | Ht 69.0 in | Wt 244.0 lb

## 2019-03-28 DIAGNOSIS — Z86010 Personal history of colonic polyps: Secondary | ICD-10-CM

## 2019-03-28 DIAGNOSIS — D122 Benign neoplasm of ascending colon: Secondary | ICD-10-CM

## 2019-03-28 DIAGNOSIS — D123 Benign neoplasm of transverse colon: Secondary | ICD-10-CM | POA: Diagnosis not present

## 2019-03-28 LAB — SARS CORONAVIRUS 2 (TAT 6-24 HRS): SARS Coronavirus 2: NEGATIVE

## 2019-03-28 NOTE — Progress Notes (Signed)
Called to room to assist during endoscopic procedure.  Patient ID and intended procedure confirmed with present staff. Received instructions for my participation in the procedure from the performing physician.  

## 2019-03-28 NOTE — Op Note (Signed)
[facility] Patient Name: [name] Procedure Date: 03/28/2019 12:48 PM MRN: [id number] Endoscopist: Docia Chuck. Henrene Pastor , MD Age: 54 Referring MD:  Date of Birth: 06/01/1965 Gender: Male Account #: [id number] Procedure:                Colonoscopy with cold snare polypectomy x 2 Indications:              High risk colon cancer surveillance: Personal                            history of multiple (3 or more) adenomas Medicines:                Monitored Anesthesia Care Procedure:                Pre-Anesthesia Assessment:                           - Prior to the procedure, a History and Physical                            was performed, and patient medications and                            allergies were reviewed. The patient's tolerance of                            previous anesthesia was also reviewed. The risks                            and benefits of the procedure and the sedation                            options and risks were discussed with the patient.                            All questions were answered, and informed consent                            was obtained. Prior Anticoagulants: The patient has                            taken no previous anticoagulant or antiplatelet                            agents. ASA Grade Assessment: II - A patient with                            mild systemic disease. After reviewing the risks                            and benefits, the patient was deemed in                            satisfactory condition to undergo the procedure.  After obtaining informed consent, the colonoscope                            was passed under direct vision. Throughout the                            procedure, the patient's blood pressure, pulse, and                            oxygen saturations were monitored continuously. The                            Colonoscope was introduced through the anus and   advanced to the the cecum, identified by                            appendiceal orifice and ileocecal valve. The                            ileocecal valve, appendiceal orifice, and rectum                            were photographed. The quality of the bowel                            preparation was excellent. The colonoscopy was                            performed without difficulty. The patient tolerated                            the procedure well. The bowel preparation used was                            SUPREP via split dose instruction. Scope In: 12:54:45 PM Scope Out: 1:07:15 PM Scope Withdrawal Time: 0 hours 9 minutes 21 seconds  Total Procedure Duration: 0 hours 12 minutes 30 seconds  Findings:                 Two polyps were found in the transverse colon and                            ascending colon. The polyps were 2 to 3 mm in size.                            These polyps were removed with a cold snare.                            Resection and retrieval were complete.                           Multiple small-mouthed diverticula were found in  the transverse colon and left colon.                           The exam was otherwise without abnormality on                            direct and retroflexion views. Complications:            No immediate complications. Estimated blood loss:                            None. Estimated Blood Loss:     Estimated blood loss: none. Impression:               - Two 2 to 3 mm polyps in the transverse colon and                            in the ascending colon, removed with a cold snare.                            Resected and retrieved.                           - Diverticulosis in the transverse colon and in the                            left colon.                           - The examination was otherwise normal on direct                            and retroflexion views. Recommendation:           - Repeat  colonoscopy in 5 years for surveillance.                           - Patient has a contact number available for                            emergencies. The signs and symptoms of potential                            delayed complications were discussed with the                            patient. Return to normal activities tomorrow.                            Written discharge instructions were provided to the                            patient.                           - Resume previous diet.                           -  Continue present medications.                           - Await pathology results. Docia Chuck. Henrene Pastor, MD 03/28/2019 1:16:46 PM This report has been signed electronically.

## 2019-03-28 NOTE — Progress Notes (Signed)
Interpreter used today at the [facility] for this pt.

## 2019-03-28 NOTE — Progress Notes (Signed)
Report to PACU, RN, vss, BBS= Clear.  

## 2019-03-28 NOTE — Progress Notes (Signed)
Temp by Ledora Bottcher by DT

## 2019-03-28 NOTE — Patient Instructions (Signed)
Handouts provided on polyps and diverticulosis.   YOU HAD AN ENDOSCOPIC PROCEDURE TODAY AT [facility]:   Refer to the procedure report that was given to you for any specific questions about what was found during the examination.  If the procedure report does not answer your questions, please call your gastroenterologist to clarify.  If you requested that your care partner not be given the details of your procedure findings, then the procedure report has been included in a sealed envelope for you to review at your convenience later.  YOU SHOULD EXPECT: Some feelings of bloating in the abdomen. Passage of more gas than usual.  Walking can help get rid of the air that was put into your GI tract during the procedure and reduce the bloating. If you had a lower endoscopy (such as a colonoscopy or flexible sigmoidoscopy) you may notice spotting of blood in your stool or on the toilet paper. If you underwent a bowel prep for your procedure, you may not have a normal bowel movement for a few days.  Please Note:  You might notice some irritation and congestion in your nose or some drainage.  This is from the oxygen used during your procedure.  There is no need for concern and it should clear up in a day or so.  SYMPTOMS TO REPORT IMMEDIATELY:   Following lower endoscopy (colonoscopy or flexible sigmoidoscopy):  Excessive amounts of blood in the stool  Significant tenderness or worsening of abdominal pains  Swelling of the abdomen that is new, acute  Fever of 100F or higher  For urgent or emergent issues, a gastroenterologist can be reached at any hour by calling [phone].   DIET:  We do recommend a small meal at first, but then you may proceed to your regular diet.  Drink plenty of fluids but you should avoid alcoholic beverages for 24 hours.  ACTIVITY:  You should plan to take it easy for the rest of today and you should NOT DRIVE or use heavy machinery until tomorrow (because  of the sedation medicines used during the test).    FOLLOW UP: Our staff will call the number listed on your records 48-72 hours following your procedure to check on you and address any questions or concerns that you may have regarding the information given to you following your procedure. If we do not reach you, we will leave a message.  We will attempt to reach you two times.  During this call, we will ask if you have developed any symptoms of COVID 19. If you develop any symptoms (ie: fever, flu-like symptoms, shortness of breath, cough etc.) before then, please call [phone].  If you test positive for Covid 19 in the 2 weeks post procedure, please call and report this information to [place].    If any biopsies were taken you will be contacted by phone or by letter within the next 1-3 weeks.  Please call us at [phone] if you have not heard about the biopsies in 3 weeks.    SIGNATURES/CONFIDENTIALITY: You and/or your care partner have signed paperwork which will be entered into your electronic medical record.  These signatures attest to the fact that that the information above on your After Visit Summary has been reviewed and is understood.  Full responsibility of the confidentiality of this discharge information lies with you and/or your care-partner.

## 2019-04-01 ENCOUNTER — Telehealth: Payer: Self-pay

## 2019-04-01 ENCOUNTER — Encounter: Payer: Self-pay | Admitting: Internal Medicine

## 2019-04-01 NOTE — Telephone Encounter (Signed)
2nd follow up call made.  NAULM 

## 2019-04-01 NOTE — Telephone Encounter (Signed)
1st follow up call made.  NAULM 

## 2019-04-03 ENCOUNTER — Encounter (HOSPITAL_COMMUNITY)
Admission: RE | Admit: 2019-04-03 | Discharge: 2019-04-03 | Disposition: A | Discharge status: Home / Self Care | Payer: 59 | Source: Ambulatory Visit | Attending: Nephrology | Admitting: Nephrology

## 2019-04-03 VITALS — BP 135/50 | HR 97 | Temp 97.1°F | Resp 18

## 2019-04-03 DIAGNOSIS — N184 Chronic kidney disease, stage 4 (severe): Secondary | ICD-10-CM | POA: Diagnosis not present

## 2019-04-03 LAB — POCT HEMOGLOBIN-HEMACUE: Hemoglobin: 8.7 g/dL — ABNORMAL LOW (ref 13.0–17.0)

## 2019-04-03 MED ORDER — EPOETIN ALFA-EPBX 10000 UNIT/ML IJ SOLN
10000.0000 [IU] | INTRAMUSCULAR | Status: DC
  Administered 2019-04-03: 10000 [IU] via SUBCUTANEOUS

## 2019-04-03 MED ORDER — EPOETIN ALFA-EPBX 10000 UNIT/ML IJ SOLN
INTRAMUSCULAR | Status: AC
  Filled 2019-04-03: qty 1
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
## 2019-04-04 ENCOUNTER — Ambulatory Visit: Payer: BLUE CROSS/BLUE SHIELD | Admitting: Neurology

## 2019-04-04 ENCOUNTER — Encounter (HOSPITAL_COMMUNITY): Payer: PRIVATE HEALTH INSURANCE

## 2019-04-05 ENCOUNTER — Other Ambulatory Visit: Payer: Self-pay

## 2019-04-05 ENCOUNTER — Encounter (HOSPITAL_COMMUNITY)
Admission: RE | Admit: 2019-04-05 | Discharge: 2019-04-05 | Disposition: A | Discharge status: Home / Self Care | Payer: 59 | Source: Ambulatory Visit | Attending: Gastroenterology | Admitting: Gastroenterology

## 2019-04-05 DIAGNOSIS — R109 Unspecified abdominal pain: Secondary | ICD-10-CM | POA: Insufficient documentation

## 2019-04-05 DIAGNOSIS — R110 Nausea: Secondary | ICD-10-CM | POA: Insufficient documentation

## 2019-04-05 DIAGNOSIS — Z01818 Encounter for other preprocedural examination: Secondary | ICD-10-CM | POA: Diagnosis present

## 2019-04-05 DIAGNOSIS — R1011 Right upper quadrant pain: Secondary | ICD-10-CM | POA: Diagnosis present

## 2019-04-05 DIAGNOSIS — R140 Abdominal distension (gaseous): Secondary | ICD-10-CM | POA: Insufficient documentation

## 2019-04-05 DIAGNOSIS — Z86010 Personal history of colonic polyps: Secondary | ICD-10-CM | POA: Diagnosis present

## 2019-04-05 DIAGNOSIS — K219 Gastro-esophageal reflux disease without esophagitis: Secondary | ICD-10-CM | POA: Insufficient documentation

## 2019-04-05 MED ORDER — TECHNETIUM TC 99M MEBROFENIN IV KIT
5.1000 | PACK | Freq: Once | INTRAVENOUS | Status: AC | PRN
  Administered 2019-04-05: 5.1 via INTRAVENOUS

## 2019-04-10 ENCOUNTER — Other Ambulatory Visit: Payer: Self-pay

## 2019-04-10 ENCOUNTER — Encounter (HOSPITAL_COMMUNITY)
Admission: RE | Admit: 2019-04-10 | Discharge: 2019-04-10 | Disposition: A | Discharge status: Home / Self Care | Payer: 59 | Source: Ambulatory Visit | Attending: Nephrology | Admitting: Nephrology

## 2019-04-10 VITALS — BP 171/87 | HR 98 | Resp 18

## 2019-04-10 DIAGNOSIS — I5041 Acute combined systolic (congestive) and diastolic (congestive) heart failure: Secondary | ICD-10-CM | POA: Diagnosis not present

## 2019-04-10 DIAGNOSIS — N184 Chronic kidney disease, stage 4 (severe): Secondary | ICD-10-CM

## 2019-04-10 DIAGNOSIS — I5043 Acute on chronic combined systolic (congestive) and diastolic (congestive) heart failure: Secondary | ICD-10-CM | POA: Diagnosis not present

## 2019-04-10 LAB — POCT HEMOGLOBIN-HEMACUE: Hemoglobin: 8.6 g/dL — ABNORMAL LOW (ref 13.0–17.0)

## 2019-04-10 MED ORDER — EPOETIN ALFA-EPBX 10000 UNIT/ML IJ SOLN
10000.0000 [IU] | INTRAMUSCULAR | Status: DC
  Administered 2019-04-10: 14:00:00 10000 [IU] via SUBCUTANEOUS

## 2019-04-10 MED ORDER — EPOETIN ALFA-EPBX 10000 UNIT/ML IJ SOLN
INTRAMUSCULAR | Status: AC
  Filled 2019-04-10: qty 1

## 2019-04-11 ENCOUNTER — Other Ambulatory Visit: Payer: Self-pay

## 2019-04-11 ENCOUNTER — Inpatient Hospital Stay (HOSPITAL_COMMUNITY)
Admission: EM | Admit: 2019-04-11 | Discharge: 2019-04-17 | DRG: 291 | Disposition: A | Discharge status: Home / Self Care | Payer: 59 | Attending: Internal Medicine | Admitting: Internal Medicine

## 2019-04-11 ENCOUNTER — Emergency Department (HOSPITAL_COMMUNITY): Payer: 59

## 2019-04-11 ENCOUNTER — Encounter (HOSPITAL_COMMUNITY): Payer: Self-pay | Admitting: Emergency Medicine

## 2019-04-11 ENCOUNTER — Telehealth: Payer: 59 | Admitting: Adult Health Nurse Practitioner

## 2019-04-11 DIAGNOSIS — Z6833 Body mass index (BMI) 33.0-33.9, adult: Secondary | ICD-10-CM | POA: Diagnosis not present

## 2019-04-11 DIAGNOSIS — E7800 Pure hypercholesterolemia, unspecified: Secondary | ICD-10-CM | POA: Diagnosis not present

## 2019-04-11 DIAGNOSIS — Z8349 Family history of other endocrine, nutritional and metabolic diseases: Secondary | ICD-10-CM

## 2019-04-11 DIAGNOSIS — R1011 Right upper quadrant pain: Secondary | ICD-10-CM | POA: Diagnosis not present

## 2019-04-11 DIAGNOSIS — Z8249 Family history of ischemic heart disease and other diseases of the circulatory system: Secondary | ICD-10-CM | POA: Diagnosis not present

## 2019-04-11 DIAGNOSIS — G4730 Sleep apnea, unspecified: Secondary | ICD-10-CM | POA: Diagnosis present

## 2019-04-11 DIAGNOSIS — Z9114 Patient's other noncompliance with medication regimen: Secondary | ICD-10-CM | POA: Diagnosis not present

## 2019-04-11 DIAGNOSIS — Z8719 Personal history of other diseases of the digestive system: Secondary | ICD-10-CM | POA: Diagnosis not present

## 2019-04-11 DIAGNOSIS — N189 Chronic kidney disease, unspecified: Secondary | ICD-10-CM | POA: Diagnosis not present

## 2019-04-11 DIAGNOSIS — I5023 Acute on chronic systolic (congestive) heart failure: Secondary | ICD-10-CM | POA: Diagnosis not present

## 2019-04-11 DIAGNOSIS — R10819 Abdominal tenderness, unspecified site: Secondary | ICD-10-CM

## 2019-04-11 DIAGNOSIS — I132 Hypertensive heart and chronic kidney disease with heart failure and with stage 5 chronic kidney disease, or end stage renal disease: Secondary | ICD-10-CM | POA: Diagnosis present

## 2019-04-11 DIAGNOSIS — N2581 Secondary hyperparathyroidism of renal origin: Secondary | ICD-10-CM | POA: Diagnosis present

## 2019-04-11 DIAGNOSIS — N185 Chronic kidney disease, stage 5: Secondary | ICD-10-CM | POA: Diagnosis not present

## 2019-04-11 DIAGNOSIS — Z20822 Contact with and (suspected) exposure to covid-19: Secondary | ICD-10-CM | POA: Diagnosis present

## 2019-04-11 DIAGNOSIS — I5031 Acute diastolic (congestive) heart failure: Secondary | ICD-10-CM | POA: Diagnosis not present

## 2019-04-11 DIAGNOSIS — G44041 Chronic paroxysmal hemicrania, intractable: Secondary | ICD-10-CM | POA: Diagnosis not present

## 2019-04-11 DIAGNOSIS — Z8711 Personal history of peptic ulcer disease: Secondary | ICD-10-CM | POA: Diagnosis not present

## 2019-04-11 DIAGNOSIS — Z9109 Other allergy status, other than to drugs and biological substances: Secondary | ICD-10-CM

## 2019-04-11 DIAGNOSIS — I5041 Acute combined systolic (congestive) and diastolic (congestive) heart failure: Secondary | ICD-10-CM | POA: Diagnosis present

## 2019-04-11 DIAGNOSIS — I5043 Acute on chronic combined systolic (congestive) and diastolic (congestive) heart failure: Principal | ICD-10-CM | POA: Diagnosis present

## 2019-04-11 DIAGNOSIS — D631 Anemia in chronic kidney disease: Secondary | ICD-10-CM | POA: Diagnosis present

## 2019-04-11 DIAGNOSIS — I420 Dilated cardiomyopathy: Secondary | ICD-10-CM

## 2019-04-11 DIAGNOSIS — Z800 Family history of malignant neoplasm of digestive organs: Secondary | ICD-10-CM

## 2019-04-11 DIAGNOSIS — Z992 Dependence on renal dialysis: Secondary | ICD-10-CM

## 2019-04-11 DIAGNOSIS — I10 Essential (primary) hypertension: Secondary | ICD-10-CM | POA: Diagnosis present

## 2019-04-11 DIAGNOSIS — N186 End stage renal disease: Secondary | ICD-10-CM | POA: Diagnosis present

## 2019-04-11 DIAGNOSIS — I5033 Acute on chronic diastolic (congestive) heart failure: Secondary | ICD-10-CM | POA: Diagnosis not present

## 2019-04-11 DIAGNOSIS — Z905 Acquired absence of kidney: Secondary | ICD-10-CM | POA: Diagnosis not present

## 2019-04-11 DIAGNOSIS — E669 Obesity, unspecified: Secondary | ICD-10-CM | POA: Diagnosis present

## 2019-04-11 DIAGNOSIS — H8093 Unspecified otosclerosis, bilateral: Secondary | ICD-10-CM | POA: Diagnosis present

## 2019-04-11 DIAGNOSIS — E785 Hyperlipidemia, unspecified: Secondary | ICD-10-CM | POA: Diagnosis present

## 2019-04-11 DIAGNOSIS — E875 Hyperkalemia: Secondary | ICD-10-CM | POA: Diagnosis not present

## 2019-04-11 DIAGNOSIS — E872 Acidosis, unspecified: Secondary | ICD-10-CM

## 2019-04-11 DIAGNOSIS — Z888 Allergy status to other drugs, medicaments and biological substances status: Secondary | ICD-10-CM

## 2019-04-11 DIAGNOSIS — E782 Mixed hyperlipidemia: Secondary | ICD-10-CM | POA: Diagnosis not present

## 2019-04-11 DIAGNOSIS — R140 Abdominal distension (gaseous): Secondary | ICD-10-CM | POA: Diagnosis present

## 2019-04-11 DIAGNOSIS — R0602 Shortness of breath: Secondary | ICD-10-CM

## 2019-04-11 DIAGNOSIS — Q600 Renal agenesis, unilateral: Secondary | ICD-10-CM | POA: Diagnosis not present

## 2019-04-11 DIAGNOSIS — I509 Heart failure, unspecified: Secondary | ICD-10-CM

## 2019-04-11 DIAGNOSIS — G44209 Tension-type headache, unspecified, not intractable: Secondary | ICD-10-CM | POA: Diagnosis present

## 2019-04-11 DIAGNOSIS — I5021 Acute systolic (congestive) heart failure: Secondary | ICD-10-CM | POA: Diagnosis not present

## 2019-04-11 LAB — BRAIN NATRIURETIC PEPTIDE: B Natriuretic Peptide: 752.1 pg/mL — ABNORMAL HIGH (ref 0.0–100.0)

## 2019-04-11 LAB — TROPONIN I (HIGH SENSITIVITY)
Troponin I (High Sensitivity): 15 ng/L (ref <18)
Troponin I (High Sensitivity): 17 ng/L (ref <18)

## 2019-04-11 LAB — CBC
HCT: 27.4 % — ABNORMAL LOW (ref 39.0–52.0)
Hemoglobin: 8.2 g/dL — ABNORMAL LOW (ref 13.0–17.0)
MCH: 27 pg (ref 26.0–34.0)
MCHC: 29.9 g/dL — ABNORMAL LOW (ref 30.0–36.0)
MCV: 90.1 fL (ref 80.0–100.0)
Platelets: 231 10*3/uL (ref 150–400)
RBC: 3.04 MIL/uL — ABNORMAL LOW (ref 4.22–5.81)
RDW: 14.9 % (ref 11.5–15.5)
WBC: 6 10*3/uL (ref 4.0–10.5)
nRBC: 0 % (ref 0.0–0.2)

## 2019-04-11 LAB — BASIC METABOLIC PANEL
Anion gap: 13 (ref 5–15)
Anion gap: 12 (ref 5–15)
BUN: 81 mg/dL — ABNORMAL HIGH (ref 6–20)
BUN: 82 mg/dL — ABNORMAL HIGH (ref 6–20)
CO2: 15 mmol/L — ABNORMAL LOW (ref 22–32)
CO2: 14 mmol/L — ABNORMAL LOW (ref 22–32)
Calcium: 6.9 mg/dL — ABNORMAL LOW (ref 8.9–10.3)
Calcium: 6.8 mg/dL — ABNORMAL LOW (ref 8.9–10.3)
Chloride: 111 mmol/L (ref 98–111)
Chloride: 114 mmol/L — ABNORMAL HIGH (ref 98–111)
Creatinine, Ser: 7.96 mg/dL — ABNORMAL HIGH (ref 0.61–1.24)
Creatinine, Ser: 7.98 mg/dL — ABNORMAL HIGH (ref 0.61–1.24)
GFR calc Af Amer: 8 mL/min — ABNORMAL LOW (ref >60)
GFR calc Af Amer: 8 mL/min — ABNORMAL LOW (ref >60)
GFR calc non Af Amer: 7 mL/min — ABNORMAL LOW (ref >60)
GFR calc non Af Amer: 7 mL/min — ABNORMAL LOW (ref >60)
Glucose, Bld: 114 mg/dL — ABNORMAL HIGH (ref 70–99)
Glucose, Bld: 135 mg/dL — ABNORMAL HIGH (ref 70–99)
Potassium: 6.1 mmol/L — ABNORMAL HIGH (ref 3.5–5.1)
Potassium: 5 mmol/L (ref 3.5–5.1)
Sodium: 139 mmol/L (ref 135–145)
Sodium: 140 mmol/L (ref 135–145)

## 2019-04-11 LAB — HEPATIC FUNCTION PANEL
ALT: 18 U/L (ref 0–44)
AST: 12 U/L — ABNORMAL LOW (ref 15–41)
Albumin: 3.4 g/dL — ABNORMAL LOW (ref 3.5–5.0)
Alkaline Phosphatase: 72 U/L (ref 38–126)
Bilirubin, Direct: <0.1 mg/dL (ref 0.0–0.2)
Total Bilirubin: 0.6 mg/dL (ref 0.3–1.2)
Total Protein: 6.2 g/dL — ABNORMAL LOW (ref 6.5–8.1)

## 2019-04-11 LAB — POC SARS CORONAVIRUS 2 AG -  ED: SARS Coronavirus 2 Ag: NEGATIVE

## 2019-04-11 LAB — HIV ANTIBODY (ROUTINE TESTING W REFLEX): HIV Screen 4th Generation wRfx: NONREACTIVE

## 2019-04-11 LAB — LIPASE, BLOOD: Lipase: 58 U/L — ABNORMAL HIGH (ref 11–51)

## 2019-04-11 MED ORDER — SODIUM CHLORIDE 0.9 % IV SOLN: 250.0000 mL | INTRAVENOUS | Status: DC | PRN

## 2019-04-11 MED ORDER — SODIUM CHLORIDE 0.9% FLUSH
3.0000 mL | Freq: Two times a day (BID) | INTRAVENOUS | Status: DC
  Administered 2019-04-12 – 2019-04-16 (×6): 3 mL via INTRAVENOUS

## 2019-04-11 MED ORDER — INSULIN ASPART 100 UNIT/ML IV SOLN
5.0000 [IU] | Freq: Once | INTRAVENOUS | Status: AC
  Administered 2019-04-11: 5 [IU] via INTRAVENOUS

## 2019-04-11 MED ORDER — SODIUM BICARBONATE 8.4 % IV SOLN
50.0000 meq | Freq: Once | INTRAVENOUS | Status: AC
  Administered 2019-04-11: 21:00:00 50 meq via INTRAVENOUS
  Filled 2019-04-11: qty 50

## 2019-04-11 MED ORDER — ALUM & MAG HYDROXIDE-SIMETH 200-200-20 MG/5ML PO SUSP: 30.0000 mL | Freq: Four times a day (QID) | ORAL | Status: DC | PRN

## 2019-04-11 MED ORDER — SODIUM CHLORIDE 0.9% FLUSH: 3.0000 mL | INTRAVENOUS | Status: DC | PRN

## 2019-04-11 MED ORDER — PANTOPRAZOLE SODIUM 40 MG IV SOLR
40.0000 mg | INTRAVENOUS | Status: DC
  Administered 2019-04-11 – 2019-04-13 (×3): 40 mg via INTRAVENOUS
  Filled 2019-04-11 (×3): qty 40

## 2019-04-11 MED ORDER — LIDOCAINE VISCOUS HCL 2 % MT SOLN: 15.0000 mL | Freq: Four times a day (QID) | OROMUCOSAL | Status: DC | PRN

## 2019-04-11 MED ORDER — PANTOPRAZOLE SODIUM 40 MG PO TBEC: 40.0000 mg | DELAYED_RELEASE_TABLET | Freq: Every day | ORAL | Status: DC

## 2019-04-11 MED ORDER — ONDANSETRON HCL 4 MG/2ML IJ SOLN: 4.0000 mg | Freq: Four times a day (QID) | INTRAMUSCULAR | Status: DC | PRN

## 2019-04-11 MED ORDER — CALCIUM ACETATE (PHOS BINDER) 667 MG PO CAPS
1334.0000 mg | ORAL_CAPSULE | Freq: Three times a day (TID) | ORAL | Status: DC
  Administered 2019-04-12 – 2019-04-17 (×12): 1334 mg via ORAL
  Filled 2019-04-11 (×13): qty 2

## 2019-04-11 MED ORDER — LABETALOL HCL 200 MG PO TABS
400.0000 mg | ORAL_TABLET | Freq: Two times a day (BID) | ORAL | Status: DC
  Administered 2019-04-12: 400 mg via ORAL
  Filled 2019-04-11: qty 2

## 2019-04-11 MED ORDER — ACETAMINOPHEN 325 MG PO TABS
650.0000 mg | ORAL_TABLET | ORAL | Status: DC | PRN
  Administered 2019-04-12 – 2019-04-13 (×4): 650 mg via ORAL
  Filled 2019-04-11 (×4): qty 2

## 2019-04-11 MED ORDER — FUROSEMIDE 10 MG/ML IJ SOLN
40.0000 mg | Freq: Once | INTRAMUSCULAR | Status: AC
  Administered 2019-04-11: 40 mg via INTRAVENOUS
  Filled 2019-04-11: qty 4

## 2019-04-11 MED ORDER — DEXTROSE 50 % IV SOLN
1.0000 | Freq: Once | INTRAVENOUS | Status: AC
  Administered 2019-04-11: 21:00:00 50 mL via INTRAVENOUS
  Filled 2019-04-11: qty 50

## 2019-04-11 MED ORDER — SODIUM CHLORIDE 0.9% FLUSH: 3.0000 mL | Freq: Once | INTRAVENOUS | Status: DC

## 2019-04-11 MED ORDER — CALCIUM GLUCONATE-NACL 1-0.675 GM/50ML-% IV SOLN
1.0000 g | Freq: Once | INTRAVENOUS | Status: AC
  Administered 2019-04-11: 23:00:00 1000 mg via INTRAVENOUS
  Filled 2019-04-11: qty 50

## 2019-04-11 MED ORDER — LABETALOL HCL 200 MG PO TABS
200.0000 mg | ORAL_TABLET | Freq: Once | ORAL | Status: AC
  Administered 2019-04-11: 21:00:00 200 mg via ORAL
  Filled 2019-04-11: qty 1

## 2019-04-11 NOTE — ED Triage Notes (Signed)
Pt here with c/o upper abd pain epigastric pain that radiates up to his throat , pt had a hida scan on the 19th , pt states some sob when sleeping and walking

## 2019-04-11 NOTE — H&P (Signed)
History and Physical    [name] UXL:[id number] DOB: 1966/01/11 DOA: 04/11/2019  PCP: Forrest Moron, MD Patient coming from: Home  Chief Complaint: Shortness of breath  HPI: [name] is a 54 y.o. male with medical history significant of CKD stage V, hypertension, hyperlipidemia, anemia, PUD presenting to the ED with complaints of shortness of breath. Patient reports 1 week history of dyspnea on exertion and orthopnea. His legs have been swollen. No cough, fevers, chills, or recent sick contacts. He has also been feeling nauseous and has not been able to take his medications for the past 1 week. Sometimes he wakes up in the morning and vomits a small amount of clear liquid. Reports having epigastric abdominal discomfort. No other complaints.  ED Course: Afebrile.  Blood pressure elevated with systolic up to 536U.  Not tachypneic or hypoxic.  Labs showing no leukocytosis.  Hemoglobin 8.2, at baseline.  Potassium 6.1.  Bicarb 15, anion gap 13.  BUN 81, creatinine 7.9.  High-sensitivity troponin negative x2.  BNP elevated at 752.  No significant elevation of lipase and LFTs.  SARS-CoV-2 rapid antigen test negative.  Chest x-ray showing new mild cardiomegaly and pulmonary vascular congestion.  New trace bilateral pleural effusions.  Patient received IV Lasix 40 mg and p.o. labetalol 200 mg.  Review of Systems:  All systems reviewed and apart from history of presenting illness, are negative.  Past Medical History:  Diagnosis Date  . Anemia   . Back pain   . CKD (chronic kidney disease), stage III    Stage 4  . Colon polyps    adenomatous  . Elevated cholesterol   . History of echocardiogram    Echo 11/17: EF 65-70, normal wall motion, grade 1 diastolic dysfunction, trivial MR, mild LAE, mild TR  . Hyperlipidemia   . Hypertension    no current bp meds for last 3 months  . Kidney stones 2007  . Medication intolerance    a. multiple with prior nonadherence to regimen.  . Nausea    "  in the morning since having the kidney problem "  . Otosclerosis of both ears   . Pneumonia   . PONV (postoperative nausea and vomiting)   . PUD (peptic ulcer disease)    Has had unspecified surgery for this  . Sleep apnea     Past Surgical History:  Procedure Laterality Date  . AV FISTULA PLACEMENT Left 01/04/2019   Procedure: ARTERIOVENOUS (AV) FISTULA CREATION LEFT ARM;  Surgeon: [name], MD;  Location: [facility];  Service: Vascular;  Laterality: Left;  . BASCILIC VEIN TRANSPOSITION Left 03/11/2019   Procedure: BASILIC VEIN TRANSPOSITION 2ND STAGE LEFT;  Surgeon: [name], MD;  Location: [place];  Service: Vascular;  Laterality: Left;  . NEPHRECTOMY  02/18/2011   Procedure: NEPHRECTOMY;  Surgeon: Hanley Ben, MD;  Location: [facility];  Service: Urology;  Laterality: Right;  . SMALL INTESTINE SURGERY    . STAPEDOTOMY  2005   lt ear jan, right ear sept  . surgery for ulcers  1990     reports that he has never smoked. He has never used smokeless tobacco. He reports that he does not drink alcohol or use drugs.  Allergies  Allergen Reactions  . Lokelma [Sodium Zirconium Cyclosilicate] Shortness Of Breath  . Beef-Derived Products Other (See Comments)    Cultural preference  . Pegademase Bovine Other (See Comments)    Cultural preference  . Poractant Alfa Other (See Comments)    Cultural  preference  . Pork-Derived Products Other (See Comments)    Cultural preference    Family History  Problem Relation Age of Onset  . Hyperlipidemia Father   . Heart disease Mother   . Hypertension Mother   . Hypertension Sister   . Heart disease Brother 75       CABG  . Hyperlipidemia Sister   . Hyperlipidemia Sister   . Esophageal cancer Cousin   . Healthy Child   . Colon cancer Neg Hx   . Stomach cancer Neg Hx   . Rectal cancer Neg Hx     Prior to Admission medications   Medication Sig Start Date End Date Taking? Authorizing Provider  calcium acetate (PHOSLO) 667  MG capsule Take 2 capsules (1,334 mg total) by mouth 3 (three) times daily with meals. Patient not taking: Reported on 03/28/2019 09/26/18   Forrest Moron, MD  furosemide (LASIX) 40 MG tablet Take 1 tablet (40 mg total) by mouth daily. Patient not taking: Reported on 04/11/2019 09/26/18   Forrest Moron, MD  HYDROcodone-acetaminophen (NORCO/VICODIN) 5-325 MG tablet Take 1 tablet by mouth every 4 (four) hours as needed for moderate pain. Patient not taking: Reported on 04/11/2019 03/11/19 03/10/20  Barbie Banner, PA-C  labetalol (NORMODYNE) 200 MG tablet Take 2 tablets (400 mg total) by mouth 2 (two) times daily. Patient not taking: Reported on 04/11/2019 08/27/18   Eugenie Filler, MD  pantoprazole (PROTONIX) 40 MG tablet Take 1 tablet (40 mg total) by mouth daily. Patient not taking: Reported on 04/11/2019 03/20/19   Loralie Champagne, PA-C    Physical Exam: Vitals:   04/11/19 1912 04/11/19 2015 04/11/19 2030 04/11/19 2045  BP: (!) 173/97 (!) 190/112 (!) 176/104 (!) 183/100  Pulse: 89 91 88 85  Resp: 18 16 17  (!) 22  Temp:      TempSrc:      SpO2: 97% 100% 99% 98%    Physical Exam  Constitutional: He is oriented to person, place, and time. He appears well-developed and well-nourished. No distress.  HENT:  Head: Normocephalic.  Eyes: Right eye exhibits no discharge. Left eye exhibits no discharge.  Neck: JVD present.  Cardiovascular: Normal rate, regular rhythm and intact distal pulses.  Pulmonary/Chest: Effort normal. No respiratory distress. He has no wheezes. He has no rales.  Abdominal: Soft. Bowel sounds are normal. He exhibits distension. There is no abdominal tenderness. There is no rebound and no guarding.  Musculoskeletal:        General: Edema present.     Cervical back: Neck supple.     Comments: +2 pitting edema of bilateral lower extremities  Neurological: He is alert and oriented to person, place, and time.  Skin: Skin is warm and dry. He is not diaphoretic.      Labs on Admission: I have personally reviewed following labs and imaging studies  CBC: Recent Labs  Lab 04/10/19 1411 04/11/19 1500  WBC  --  6.0  HGB 8.6* 8.2*  HCT  --  27.4*  MCV  --  90.1  PLT  --  097   Basic Metabolic Panel: Recent Labs  Lab 04/11/19 1500  NA 139  K 6.1*  CL 111  CO2 15*  GLUCOSE 114*  BUN 81*  CREATININE 7.96*  CALCIUM 6.9*   GFR: CrCl cannot be calculated (Unknown ideal weight.). Liver Function Tests: Recent Labs  Lab 04/11/19 1700  AST 12*  ALT 18  ALKPHOS 72  BILITOT 0.6  PROT 6.2*  ALBUMIN 3.4*   Recent Labs  Lab 04/11/19 1700  LIPASE 58*   No results for input(s): AMMONIA in the last 168 hours. Coagulation Profile: No results for input(s): INR, PROTIME in the last 168 hours. Cardiac Enzymes: No results for input(s): CKTOTAL, CKMB, CKMBINDEX, TROPONINI in the last 168 hours. BNP (last 3 results) No results for input(s): PROBNP in the last 8760 hours. HbA1C: No results for input(s): HGBA1C in the last 72 hours. CBG: No results for input(s): GLUCAP in the last 168 hours. Lipid Profile: No results for input(s): CHOL, HDL, LDLCALC, TRIG, CHOLHDL, LDLDIRECT in the last 72 hours. Thyroid Function Tests: No results for input(s): TSH, T4TOTAL, FREET4, T3FREE, THYROIDAB in the last 72 hours. Anemia Panel: No results for input(s): VITAMINB12, FOLATE, FERRITIN, TIBC, IRON, RETICCTPCT in the last 72 hours. Urine analysis:    Component Value Date/Time   COLORURINE STRAW (A) 08/25/2018 1436   APPEARANCEUR CLEAR 08/25/2018 1436   LABSPEC 1.011 08/25/2018 1436   PHURINE 6.0 08/25/2018 1436   GLUCOSEU NEGATIVE 08/25/2018 1436   HGBUR NEGATIVE 08/25/2018 1436   BILIRUBINUR negative 02/06/2019 1529   BILIRUBINUR neg 03/12/2013 1058   KETONESUR negative 02/06/2019 1529   KETONESUR NEGATIVE 08/25/2018 1436   PROTEINUR >=300 (A) 02/06/2019 1529   PROTEINUR >=300 (A) 08/25/2018 1436   UROBILINOGEN 0.2 02/06/2019 1529    UROBILINOGEN 0.2 07/21/2012 1235   NITRITE Negative 02/06/2019 1529   NITRITE NEGATIVE 08/25/2018 1436   LEUKOCYTESUR Negative 02/06/2019 1529   LEUKOCYTESUR NEGATIVE 08/25/2018 1436    Radiological Exams on Admission: DG Chest 2 View  Result Date: 04/11/2019 CLINICAL DATA:  Chest pain and shortness of breath. EXAM: CHEST - 2 VIEW COMPARISON:  Chest x-ray dated November 26, 2018. FINDINGS: New mild cardiomegaly and pulmonary vascular congestion. New trace bilateral pleural effusions. No consolidation or pneumothorax. No acute osseous abnormality. IMPRESSION: New mild congestive heart failure. Electronically Signed   By: Titus Dubin M.D.   On: 04/11/2019 15:12    Assessment/Plan Principal Problem:   Acute exacerbation of CHF (congestive heart failure) (HCC) Active Problems:   Uncontrolled hypertension   Hyperkalemia   Metabolic acidosis   CKD (chronic kidney disease) stage 5, GFR less than 15 ml/min (HCC)   Volume overload secondary to CHF exacerbation in the setting of CKD stage V: Blood pressure elevated. Has signs of volume overload on exam. BNP elevated. Chest x-ray showing new mild cardiomegaly and pulmonary vascular congestion. New trace bilateral pleural effusions. No tachypnea or hypoxia. Last echo done July 2020 with normal systolic function (LVEF 60 to 83%) and diastolic parameters consistent with impaired relaxation. Plan: Received IV Lasix 40 mg daily. Reassess renal function in a.m. before ordering additional doses of Lasix. Consult nephrology in a.m. Monitor intake and output, daily weights, low-sodium diet with fluid restriction. Repeat echocardiogram.  Hyperkalemia and metabolic acidosis in the setting of CKD stage V: Potassium 6.1 and bicarb 15. Plan: Stat EKG, cardiac monitoring. Give medical therapy for hyperkalemia including insulin/dextrose, sodium bicarb, and calcium gluconate. In addition, Lasix should also help with hyperkalemia. Continue to monitor renal function  panel. Consult nephrology in a.m.  CKD stage V: Presenting with signs of volume overload, hyperkalemia, and metabolic acidosis. Underwent left upper extremity AV fistula placement on 03/11/2019. Plan: Management mentioned above. Consult nephrology in a.m.  Epigastric abdominal pain/nausea/vomiting: Has history of peptic ulcer disease which is likely contributing. No significant elevation of lipase and LFTs. HIDA scan done 04/05/2019 normal. No abdominal tenderness on exam. Plan: IV Protonix  40 mg daily, GI cocktail as needed, antiemetic as needed.  Uncontrolled hypertension: Secondary to volume overload and medication nonadherence. Blood pressure elevated with systolic up to 914C. Plan: Resume home labetalol, IV hydralazine PRN  DVT prophylaxis: SCDs.  Unable to give heparin products due to allergy to pork derived products. Code Status: Full code Family Communication: No family available at this time. Disposition Plan: Anticipate discharge after clinical improvement. Currently being treated with an IV diuretic, may need to be started on dialysis during this hospitalization. Pending nephrology evaluation. Admission status: It is my clinical opinion that admission to INPATIENT is reasonable and necessary because of the expectation that this patient will require hospital care that crosses at least 2 midnights to treat this condition based on the medical complexity of the problems presented.  Given the aforementioned information, the predictability of an adverse outcome is felt to be significant.  The medical decision making on this patient was of high complexity and the patient is at high risk for clinical deterioration, therefore this is a level 3 visit.  Shela Leff MD [facility]  If 7PM-7AM, please contact night-coverage [email or web address]  04/11/2019, 9:32 PM

## 2019-04-11 NOTE — ED Provider Notes (Signed)
[facility] EMERGENCY DEPARTMENT Provider Note   CSN: [id number] Arrival date & time: 04/11/19  1426     History CC: Shortness of breath  Arabic translator service used during all discussions with the patient.  He speaks limited [place] but did prefer to use a translator line whenever possible.  [name] is a 54 y.o. male w/ hx of CKD pending initiation of dialysis in March, CHF grade 1 diastolic dysfunction, HTN, HLD, Anemia, PUD presenting to the ED with SOB.  Patient reports that for the past week he has felt quite short of breath, particularly when he is lying down in bed at night.  He says he is also felt short of breath with trying to ambulate.  He says when he is sitting upright he feels most normal.  He says this has been a progressive issue for the past several months but seems to really have worsened in the past week.  He notes that he also has been putting on fluid in both of his legs.  He also reports he has had separately nausea and difficulty with keeping down food, most commonly in the morning.  He says he had a HIDA scan performed last week with [facility].  He was started on protonix.  He stopped taking all of his medications yesterday because he felt like these were causing his symptoms.  He is unsure which of his medicines may be new.  He tells me that his labetalol 200 mg twice daily may be a new medication (or records show initiation in July 2020).  He does urinate "multiple times" per day and says his urine is clear.  He is on laxis 40 mg BID and has been compliant with that except for today.  Denies fevers, chills, sick contacts at home.  HPI     Past Medical History:  Diagnosis Date  . Anemia   . Back pain   . CKD (chronic kidney disease), stage III    Stage 4  . Colon polyps    adenomatous  . Elevated cholesterol   . History of echocardiogram    Echo 11/17: EF 65-70, normal wall motion, grade 1 diastolic dysfunction, trivial MR, mild  LAE, mild TR  . Hyperlipidemia   . Hypertension    no current bp meds for last 3 months  . Kidney stones 2007  . Medication intolerance    a. multiple with prior nonadherence to regimen.  . Nausea    " in the morning since having the kidney problem "  . Otosclerosis of both ears   . Pneumonia   . PONV (postoperative nausea and vomiting)   . PUD (peptic ulcer disease)    Has had unspecified surgery for this  . Sleep apnea     Patient Active Problem List   Diagnosis Date Noted  . Acute exacerbation of CHF (congestive heart failure) ([facility]) 04/11/2019  . Metabolic acidosis 40/98/1191  . CKD (chronic kidney disease) stage 5, GFR less than 15 ml/min (HCC) 04/11/2019  . History of colon polyps 03/20/2019  . Gastroesophageal reflux disease 03/20/2019  . RUQ pain 03/20/2019  . Abdominal pain 03/20/2019  . Bloating 03/20/2019  . Preop examination 03/20/2019  . Nausea without vomiting 03/20/2019  . CKD (chronic kidney disease) stage 4, GFR 15-29 ml/min (HCC) 08/25/2018  . Hyperkalemia 08/25/2018  . Chronic primary headache 01/23/2018  . CKD (chronic kidney disease) stage 3, GFR 30-59 ml/min 05/10/2017  . Renal insufficiency 11/15/2016  . Acute on chronic  diastolic CHF (congestive heart failure) ([facility]) 10/07/2016  . Testicular hypofunction 09/15/2015  . BPH without obstruction/lower urinary tract symptoms 09/15/2015  . Bilateral hearing loss 07/03/2015  . Mixed hearing loss of right ear 07/03/2015  . Chest pain 03/29/2013  . Hyperlipidemia 03/29/2013  . Nephrolithiasis 03/14/2013  . Acute blood loss anemia 03/14/2013  . Lumbar radiculopathy 02/26/2013  . Dyspnea 11/25/2011  . Leg pain, left 11/25/2011  . Vitamin D deficiency 08/03/2011  . Constipation 04/26/2011  . Uncontrolled hypertension 04/10/2010  . HLD (hyperlipidemia) 04/10/2010    Past Surgical History:  Procedure Laterality Date  . AV FISTULA PLACEMENT Left 01/04/2019   Procedure: ARTERIOVENOUS (AV) FISTULA CREATION  LEFT ARM;  Surgeon: [name], MD;  Location: [facility];  Service: Vascular;  Laterality: Left;  . BASCILIC VEIN TRANSPOSITION Left 03/11/2019   Procedure: BASILIC VEIN TRANSPOSITION 2ND STAGE LEFT;  Surgeon: [name], MD;  Location: [facility];  Service: Vascular;  Laterality: Left;  . NEPHRECTOMY  02/18/2011   Procedure: NEPHRECTOMY;  Surgeon: Hanley Ben, MD;  Location: [facility];  Service: Urology;  Laterality: Right;  . SMALL INTESTINE SURGERY    . STAPEDOTOMY  2005   lt ear jan, right ear sept  . surgery for ulcers  1990       Family History  Problem Relation Age of Onset  . Hyperlipidemia Father   . Heart disease Mother   . Hypertension Mother   . Hypertension Sister   . Heart disease Brother 60       CABG  . Hyperlipidemia Sister   . Hyperlipidemia Sister   . Esophageal cancer Cousin   . Healthy Child   . Colon cancer Neg Hx   . Stomach cancer Neg Hx   . Rectal cancer Neg Hx     Social History   Tobacco Use  . Smoking status: Never Smoker  . Smokeless tobacco: Never Used  Substance Use Topics  . Alcohol use: No  . Drug use: No    Home Medications Prior to Admission medications   Medication Sig Start Date End Date Taking? Authorizing Provider  calcium acetate (PHOSLO) 667 MG capsule Take 2 capsules (1,334 mg total) by mouth 3 (three) times daily with meals. Patient not taking: Reported on 03/28/2019 09/26/18   Forrest Moron, MD  furosemide (LASIX) 40 MG tablet Take 1 tablet (40 mg total) by mouth daily. Patient not taking: Reported on 04/11/2019 09/26/18   Forrest Moron, MD  HYDROcodone-acetaminophen (NORCO/VICODIN) 5-325 MG tablet Take 1 tablet by mouth every 4 (four) hours as needed for moderate pain. Patient not taking: Reported on 04/11/2019 03/11/19 03/10/20  Barbie Banner, PA-C  labetalol (NORMODYNE) 200 MG tablet Take 2 tablets (400 mg total) by mouth 2 (two) times daily. Patient not taking: Reported on 04/11/2019 08/27/18   Eugenie Filler, MD  pantoprazole (PROTONIX) 40 MG tablet Take 1 tablet (40 mg total) by mouth daily. Patient not taking: Reported on 04/11/2019 03/20/19   Zehr, Laban Emperor, PA-C    Allergies    Lokelma [sodium zirconium cyclosilicate], Beef-derived products, Pegademase bovine, Poractant alfa, and Pork-derived products  Review of Systems   Review of Systems  Constitutional: Positive for fatigue. Negative for chills and fever.  Respiratory: Positive for cough and shortness of breath.   Cardiovascular: Positive for leg swelling. Negative for chest pain and palpitations.  Gastrointestinal: Positive for nausea. Negative for vomiting.  Musculoskeletal: Negative for arthralgias and back pain.  Neurological: Negative for syncope and  headaches.  All other systems reviewed and are negative.   Physical Exam Updated Vital Signs BP (!) 184/98   Pulse 85   Temp 97.8 F (36.6 C) (Oral)   Resp 18   Ht 5\' 9"  (1.753 m)   Wt 109.4 kg   SpO2 96%   BMI 35.62 kg/m   Physical Exam Vitals and nursing note reviewed.  Constitutional:      Appearance: He is well-developed. He is obese.  HENT:     Head: Normocephalic and atraumatic.  Eyes:     Conjunctiva/sclera: Conjunctivae normal.  Cardiovascular:     Rate and Rhythm: Normal rate and regular rhythm.  Pulmonary:     Effort: Pulmonary effort is normal. No respiratory distress.     Comments: Crackles in lung bases Abdominal:     Palpations: Abdomen is soft.     Tenderness: There is no abdominal tenderness. There is no guarding.  Musculoskeletal:     Cervical back: Neck supple.     Right lower leg: Edema present.     Left lower leg: Edema present.  Skin:    General: Skin is warm and dry.  Neurological:     General: No focal deficit present.     Mental Status: He is alert and oriented to person, place, and time.     ED Results / Procedures / Treatments   Labs (all labs ordered are listed, but only abnormal results are displayed) Labs Reviewed  BASIC  METABOLIC PANEL - Abnormal; Notable for the following components:      Result Value   Potassium 6.1 (*)    CO2 15 (*)    Glucose, Bld 114 (*)    BUN 81 (*)    Creatinine, Ser 7.96 (*)    Calcium 6.9 (*)    GFR calc non Af Amer 7 (*)    GFR calc Af Amer 8 (*)    All other components within normal limits  CBC - Abnormal; Notable for the following components:   RBC 3.04 (*)    Hemoglobin 8.2 (*)    HCT 27.4 (*)    MCHC 29.9 (*)    All other components within normal limits  BRAIN NATRIURETIC PEPTIDE - Abnormal; Notable for the following components:   B Natriuretic Peptide 752.1 (*)    All other components within normal limits  HEPATIC FUNCTION PANEL - Abnormal; Notable for the following components:   Total Protein 6.2 (*)    Albumin 3.4 (*)    AST 12 (*)    All other components within normal limits  LIPASE, BLOOD - Abnormal; Notable for the following components:   Lipase 58 (*)    All other components within normal limits  RENAL FUNCTION PANEL - Abnormal; Notable for the following components:   Potassium 5.7 (*)    Chloride 113 (*)    CO2 15 (*)    Glucose, Bld 120 (*)    BUN 82 (*)    Creatinine, Ser 7.91 (*)    Calcium 6.8 (*)    Phosphorus 6.9 (*)    Albumin 3.0 (*)    GFR calc non Af Amer 7 (*)    GFR calc Af Amer 8 (*)    All other components within normal limits  BASIC METABOLIC PANEL - Abnormal; Notable for the following components:   Chloride 114 (*)    CO2 14 (*)    Glucose, Bld 135 (*)    BUN 82 (*)    Creatinine, Ser 7.98 (*)  Calcium 6.8 (*)    GFR calc non Af Amer 7 (*)    GFR calc Af Amer 8 (*)    All other components within normal limits  SARS CORONAVIRUS 2 (TAT 6-24 HRS)  HIV ANTIBODY (ROUTINE TESTING W REFLEX)  POC SARS CORONAVIRUS 2 AG -  ED  TROPONIN I (HIGH SENSITIVITY)  TROPONIN I (HIGH SENSITIVITY)    EKG EKG Interpretation  Date/Time:  Thursday April 11 2019 21:41:24 EST Ventricular Rate:  84 PR Interval:    QRS Duration: 90 QT  Interval:  403 QTC Calculation: 477 R Axis:   46 Text Interpretation: Sinus rhythm Borderline prolonged QT interval No STEMI Confirmed by Octaviano Glow [phone]) on 04/12/2019 11:16:52 AM   Radiology DG Chest 2 View  Result Date: 04/11/2019 CLINICAL DATA:  Chest pain and shortness of breath. EXAM: CHEST - 2 VIEW COMPARISON:  Chest x-ray dated November 26, 2018. FINDINGS: New mild cardiomegaly and pulmonary vascular congestion. New trace bilateral pleural effusions. No consolidation or pneumothorax. No acute osseous abnormality. IMPRESSION: New mild congestive heart failure. Electronically Signed   By: Titus Dubin M.D.   On: 04/11/2019 15:12   ECHOCARDIOGRAM COMPLETE  Result Date: 04/12/2019    ECHOCARDIOGRAM REPORT   Patient Name:   [name] Date of Exam: 04/12/2019 Medical Rec #:  [id number]  Height:       69.0 in Accession #:    [id number] Weight:       241.2 lb Date of Birth:  11/04/1965   BSA:          2.237 m Patient Age:    12 years   BP:           184/98 mmHg Patient Gender: M          HR:           85 bpm. Exam Location:  Inpatient Procedure: 2D Echo and Strain Analysis Indications:    CHF-Acute Diastolic 809.98 / P38.25  History:        Patient has prior history of Echocardiogram examinations, most                 recent 08/26/2018. Risk Factors:Hypertension, Diabetes and Sleep                 Apnea. Chronic kidney disease.  Sonographer:    Darlina Sicilian RDCS Referring Phys: [id number] [place]  1. Low normal GLS -14.9 EF has decreased since echo done July 2020 . Left ventricular ejection fraction, by estimation, is 35 to 40%. The left ventricle has moderately decreased function. The left ventricle demonstrates global hypokinesis. The left ventricular internal cavity size was moderately dilated. Left ventricular diastolic parameters are consistent with Grade II diastolic dysfunction (pseudonormalization). Elevated left ventricular end-diastolic pressure.  2. Right ventricular  systolic function is normal. The right ventricular size is normal. There is moderately elevated pulmonary artery systolic pressure.  3. Left atrial size was moderately dilated.  4. Right atrial size was moderately dilated.  5. The mitral valve is normal in structure and function. Mild to moderate mitral valve regurgitation. No evidence of mitral stenosis.  6. The aortic valve is normal in structure and function. Aortic valve regurgitation is not visualized. No aortic stenosis is present.  7. Aortic dilatation noted. There is mild dilatation of the aortic root measuring 40 mm.  8. The inferior vena cava is normal in size with greater than 50% respiratory variability, suggesting right atrial pressure of 3 mmHg. FINDINGS  Left  Ventricle: Low normal GLS -14.9 EF has decreased since echo done July 2020. Left ventricular ejection fraction, by estimation, is 35 to 40%. The left ventricle has moderately decreased function. The left ventricle demonstrates global hypokinesis. The left ventricular internal cavity size was moderately dilated. There is no left ventricular hypertrophy. Left ventricular diastolic parameters are consistent with Grade II diastolic dysfunction (pseudonormalization). Elevated left ventricular end-diastolic pressure. Right Ventricle: The right ventricular size is normal. No increase in right ventricular wall thickness. Right ventricular systolic function is normal. There is moderately elevated pulmonary artery systolic pressure. The tricuspid regurgitant velocity is 3.39 m/s, and with an assumed right atrial pressure of 8 mmHg, the estimated right ventricular systolic pressure is 07.6 mmHg. Left Atrium: Left atrial size was moderately dilated. Right Atrium: Right atrial size was moderately dilated. Pericardium: A small pericardial effusion is present. The pericardial effusion is posterior to the left ventricle. Mitral Valve: The mitral valve is normal in structure and function. There is mild thickening  of the mitral valve leaflet(s). There is mild calcification of the mitral valve leaflet(s). Normal mobility of the mitral valve leaflets. Mild to moderate mitral  valve regurgitation. No evidence of mitral valve stenosis. Tricuspid Valve: The tricuspid valve is normal in structure. Tricuspid valve regurgitation is mild . No evidence of tricuspid stenosis. Aortic Valve: The aortic valve is normal in structure and function. Aortic valve regurgitation is not visualized. No aortic stenosis is present. Pulmonic Valve: The pulmonic valve was normal in structure. Pulmonic valve regurgitation is trivial. No evidence of pulmonic stenosis. Aorta: The aortic root is normal in size and structure and aortic dilatation noted. There is mild dilatation of the aortic root measuring 40 mm. Venous: The inferior vena cava is normal in size with greater than 50% respiratory variability, suggesting right atrial pressure of 3 mmHg. IAS/Shunts: No atrial level shunt detected by color flow Doppler.  LEFT VENTRICLE PLAX 2D LVIDd:         5.60 cm      Diastology LVIDs:         4.02 cm      LV e' lateral:   7.62 cm/s LV PW:         1.06 cm      LV E/e' lateral: 16.6 LV IVS:        0.98 cm      LV e' medial:    6.09 cm/s LVOT diam:     2.10 cm      LV E/e' medial:  20.8 LV SV:         77 LV SV Index:   34 LVOT Area:     3.46 cm  LV Volumes (MOD) LV vol d, MOD A2C: 151.5 ml LV vol d, MOD A4C: 187.5 ml LV vol s, MOD A2C: 96.7 ml LV vol s, MOD A4C: 94.0 ml LV SV MOD A2C:     54.8 ml LV SV MOD A4C:     187.5 ml LV SV MOD BP:      79.9 ml RIGHT VENTRICLE RV S prime:     11.90 cm/s TAPSE (M-mode): 2.3 cm LEFT ATRIUM              Index       RIGHT ATRIUM           Index LA diam:        4.60 cm  2.06 cm/m  RA Area:     24.60 cm LA Vol (A2C):   126.0 ml 56.[place]  ml/m RA Volume:   79.50 ml  35.54 ml/m LA Vol (A4C):   93.6 ml  41.84 ml/m LA Biplane Vol: 110.0 ml 49.17 ml/m  AORTIC VALVE LVOT Vmax:   119.00 cm/s LVOT Vmean:  81.900 cm/s LVOT VTI:     0.221 m  AORTA Ao Root diam: 3.60 cm Ao Asc diam:  4.00 cm MITRAL VALVE                 TRICUSPID VALVE MV Area (PHT): 6.05 cm      TR Peak grad:   46.0 mmHg MV Decel Time: 125 msec      TR Vmax:        339.00 cm/s MR Peak grad:    144.5 mmHg MR Mean grad:    95.0 mmHg   SHUNTS MR Vmax:         601.00 cm/s Systemic VTI:  0.22 m MR Vmean:        460.0 cm/s  Systemic Diam: 2.10 cm MR PISA:         0.25 cm MR PISA Eff ROA: 2 mm MR PISA Radius:  0.20 cm MV E velocity: 126.67 cm/s MV A velocity: 60.20 cm/s MV E/A ratio:  2.10 Jenkins Rouge MD Electronically signed by Jenkins Rouge MD Signature Date/Time: 04/12/2019/9:52:33 AM    Final     Procedures .Critical Care Performed by: Wyvonnia Dusky, MD Authorized by: Wyvonnia Dusky, MD   Critical care provider statement:    Critical care time (minutes):  38   Critical care was necessary to treat or prevent imminent or life-threatening deterioration of the following conditions:  Endocrine crisis   Critical care was time spent personally by me on the following activities:  Discussions with consultants, evaluation of patient's response to treatment, examination of patient, ordering and performing treatments and interventions, ordering and review of laboratory studies, ordering and review of radiographic studies, pulse oximetry, re-evaluation of patient's condition, obtaining history from patient or surrogate and review of old charts Comments:     Hyperkalemia in setting of renal disease requiring IV insulin, IV lasix Congestive heart failure with volume overload requiring IV diuresis   (including critical care time)  Medications Ordered in ED Medications  sodium chloride flush (NS) 0.9 % injection 3 mL (has no administration in time range)  labetalol (NORMODYNE) tablet 400 mg (400 mg Oral Given 04/12/19 0843)  calcium acetate (PHOSLO) capsule 1,334 mg (1,334 mg Oral Given 04/12/19 0844)  sodium chloride flush (NS) 0.9 % injection 3 mL (3 mLs Intravenous  Not Given 04/11/19 2048)  sodium chloride flush (NS) 0.9 % injection 3 mL (has no administration in time range)  0.9 %  sodium chloride infusion (has no administration in time range)  acetaminophen (TYLENOL) tablet 650 mg (650 mg Oral Given by Other 04/12/19 0405)  pantoprazole (PROTONIX) injection 40 mg (40 mg Intravenous Given 04/11/19 2257)  alum & mag hydroxide-simeth (MAALOX/MYLANTA) 200-200-20 MG/5ML suspension 30 mL (has no administration in time range)    And  lidocaine (XYLOCAINE) 2 % viscous mouth solution 15 mL (has no administration in time range)  ondansetron (ZOFRAN) injection 4 mg (has no administration in time range)  sodium bicarbonate tablet 650 mg (650 mg Oral Given 04/12/19 0844)  furosemide (LASIX) injection 40 mg (40 mg Intravenous Given 04/11/19 2048)  labetalol (NORMODYNE) tablet 200 mg (200 mg Oral Given 04/11/19 2048)  sodium bicarbonate injection 50 mEq (50 mEq Intravenous Given 04/11/19 2059)  insulin aspart (novoLOG) injection 5 Units (  5 Units Intravenous Given 04/11/19 2058)    And  dextrose 50 % solution 50 mL (50 mLs Intravenous Given 04/11/19 2058)  calcium gluconate 1 g/ 50 mL sodium chloride IVPB (0 mg Intravenous Stopped 04/12/19 0044)    ED Course  I have reviewed the triage vital signs and the nursing notes.  Pertinent labs & imaging results that were available during my care of the patient were reviewed by me and considered in my medical decision making (see chart for details).  54 yo primarily-Arabic speaking gentleman w/ hx of reflux, obesity, CKD pending initiation of dialysis (he still makes urine daily), HTN, presenting to the ED with worsening dyspnea and fatigue for the past week.  This is acute on chronic.  Clinically he examines as a likely CHF exacerbation.  His last echo was 6 months ago (July 2020) demonstrating EF 60-65% with some impaired relaxation, likely diastolic dysfunction, for which he is on Lasix and claims to have been compliant until he  "quit" all of his medications yesterday.  I am concerned about his stopping his calcium, phos supplements for his renal failure, as well as his lasix for CHF, and his labetalol for his HTN.  His blood pressure has been creeping up in the ED.  I explained to him with the translator that it's important that he makes medication changes in conjunction with a physician, and not to stop all of his medications together.  He may be feeling some side effects of the beta blocker with his fatigue, as he tells me this is a "new medication," but I more specifically think he is feeling the effects of CHF.  We'll diurese him in the ED and manage his hyperKalemia.  His ECG shows no signs of acute/peaked T waves or changes concerning with hyperkalemia  Renal function appears to be close to baseline, which is poor, but he does make urine, and we can diurese him in the ED.  Separately, he reports abdominal discomfort ongoing for weeks, for which he has been seeing GI.  He reports an outpatient HIDA scan last week - it's not clear what those results were.  However he had as no focal abdominal tenderness on my exam, and his labs do not suggest acute biliary or pancreatic dysfunction.  I believe this workup is non-emergent from an ED perspective.    Clinical Course as of Apr 12 1115  Thu Apr 11, 2019  1801 Cr near baseline for the past month but worsening hyperK   [MT]    Clinical Course User Index [MT] Wyvonnia Dusky, MD   Final Clinical Impression(s) / ED Diagnoses Final diagnoses:  Abdominal tenderness  Hyperkalemia  Shortness of breath  Congestive heart failure, unspecified HF chronicity, unspecified heart failure type [facility])    Rx / DC Orders ED Discharge Orders    None       Wyvonnia Dusky, MD 04/12/19 [phone]

## 2019-04-11 NOTE — ED Notes (Addendum)
Pt states that he has stopped taking all of his meds  And will start dialysis soon

## 2019-04-12 ENCOUNTER — Inpatient Hospital Stay (HOSPITAL_COMMUNITY): Payer: 59

## 2019-04-12 DIAGNOSIS — E669 Obesity, unspecified: Secondary | ICD-10-CM

## 2019-04-12 DIAGNOSIS — I5043 Acute on chronic combined systolic (congestive) and diastolic (congestive) heart failure: Principal | ICD-10-CM

## 2019-04-12 DIAGNOSIS — Q600 Renal agenesis, unilateral: Secondary | ICD-10-CM

## 2019-04-12 DIAGNOSIS — N189 Chronic kidney disease, unspecified: Secondary | ICD-10-CM

## 2019-04-12 DIAGNOSIS — I5031 Acute diastolic (congestive) heart failure: Secondary | ICD-10-CM

## 2019-04-12 DIAGNOSIS — R1011 Right upper quadrant pain: Secondary | ICD-10-CM

## 2019-04-12 DIAGNOSIS — G44209 Tension-type headache, unspecified, not intractable: Secondary | ICD-10-CM

## 2019-04-12 DIAGNOSIS — D631 Anemia in chronic kidney disease: Secondary | ICD-10-CM

## 2019-04-12 DIAGNOSIS — E875 Hyperkalemia: Secondary | ICD-10-CM

## 2019-04-12 DIAGNOSIS — I5021 Acute systolic (congestive) heart failure: Secondary | ICD-10-CM

## 2019-04-12 DIAGNOSIS — I10 Essential (primary) hypertension: Secondary | ICD-10-CM

## 2019-04-12 DIAGNOSIS — N185 Chronic kidney disease, stage 5: Secondary | ICD-10-CM

## 2019-04-12 DIAGNOSIS — E872 Acidosis: Secondary | ICD-10-CM

## 2019-04-12 LAB — RENAL FUNCTION PANEL
Albumin: 3 g/dL — ABNORMAL LOW (ref 3.5–5.0)
Anion gap: 12 (ref 5–15)
BUN: 82 mg/dL — ABNORMAL HIGH (ref 6–20)
CO2: 15 mmol/L — ABNORMAL LOW (ref 22–32)
Calcium: 6.8 mg/dL — ABNORMAL LOW (ref 8.9–10.3)
Chloride: 113 mmol/L — ABNORMAL HIGH (ref 98–111)
Creatinine, Ser: 7.91 mg/dL — ABNORMAL HIGH (ref 0.61–1.24)
GFR calc Af Amer: 8 mL/min — ABNORMAL LOW (ref >60)
GFR calc non Af Amer: 7 mL/min — ABNORMAL LOW (ref >60)
Glucose, Bld: 120 mg/dL — ABNORMAL HIGH (ref 70–99)
Phosphorus: 6.9 mg/dL — ABNORMAL HIGH (ref 2.5–4.6)
Potassium: 5.7 mmol/L — ABNORMAL HIGH (ref 3.5–5.1)
Sodium: 140 mmol/L (ref 135–145)

## 2019-04-12 LAB — ECHOCARDIOGRAM COMPLETE
Height: 69 in
Weight: 3858.93 oz

## 2019-04-12 LAB — SARS CORONAVIRUS 2 (TAT 6-24 HRS): SARS Coronavirus 2: NEGATIVE

## 2019-04-12 MED ORDER — NEPRO/CARBSTEADY PO LIQD
237.0000 mL | Freq: Two times a day (BID) | ORAL | Status: DC
  Administered 2019-04-14 – 2019-04-17 (×6): 237 mL via ORAL

## 2019-04-12 MED ORDER — CALCITRIOL 0.5 MCG PO CAPS
0.5000 ug | ORAL_CAPSULE | Freq: Every day | ORAL | Status: DC
  Administered 2019-04-13 – 2019-04-17 (×5): 0.5 ug via ORAL
  Filled 2019-04-12 (×4): qty 1

## 2019-04-12 MED ORDER — FUROSEMIDE 10 MG/ML IJ SOLN
80.0000 mg | Freq: Once | INTRAMUSCULAR | Status: AC
  Administered 2019-04-12: 21:00:00 80 mg via INTRAVENOUS
  Filled 2019-04-12: qty 8

## 2019-04-12 MED ORDER — CARVEDILOL 25 MG PO TABS
25.0000 mg | ORAL_TABLET | Freq: Two times a day (BID) | ORAL | Status: DC
  Administered 2019-04-12 – 2019-04-17 (×8): 25 mg via ORAL
  Filled 2019-04-12 (×8): qty 1

## 2019-04-12 MED ORDER — SODIUM BICARBONATE 650 MG PO TABS
650.0000 mg | ORAL_TABLET | Freq: Two times a day (BID) | ORAL | Status: DC
  Administered 2019-04-12 – 2019-04-14 (×5): 650 mg via ORAL
  Filled 2019-04-12 (×5): qty 1

## 2019-04-12 MED ORDER — ISOSORB DINITRATE-HYDRALAZINE 20-37.5 MG PO TABS
1.0000 | ORAL_TABLET | Freq: Three times a day (TID) | ORAL | Status: DC
  Administered 2019-04-12 – 2019-04-13 (×4): 1 via ORAL
  Filled 2019-04-12 (×4): qty 1

## 2019-04-12 NOTE — Progress Notes (Signed)
Initial Nutrition Assessment  RD working remotely.   DOCUMENTATION CODES:   Obesity unspecified  INTERVENTION:  Nepro Shake po BID, each supplement provides 425 kcal and 19 grams protein   NUTRITION DIAGNOSIS:   Inadequate oral intake related to nausea, vomiting as evidenced by per patient/family report.    GOAL:   Patient will meet greater than or equal to 90% of their needs    MONITOR:   PO intake, Supplement acceptance, Weight trends, Labs, I & O's  REASON FOR ASSESSMENT:   Malnutrition Screening Tool    ASSESSMENT:   Pt with a PMH significant for solitary kidney after right nephrectomy for RCC?,  CKD-5 on list for transplant, HTN, anemia of renal disease, PUD and HLD presenting with complaints of shortness of breath, DOE, orthopnea, edema and headache.  Pt noted to have recently stopped taking his medications. Pt admitted for volume overload due to CHF exacerbation.  RD unable to reach pt via phone. Unable to obtain diet and wt hx at this time. Based on wts available in chart, pt is noted to have had a 6% wt loss x4 months.  Epigastric abdominal pain/nausea/vomting noted per H&P.   Discussed pt with RN.   No PO intake documented.   Medications reviewed and include: Phoslo, sodium bicarbonate  Labs reviewed: Potassium 5.7 (H), Phosphorus 6.9 (H)   UOP: 576ml x24 hours I/O: -216.13ml since admit  NUTRITION - FOCUSED PHYSICAL EXAM:  RD unable to perform at this time.  Diet Order:   Diet Order            Diet Heart Room service appropriate? Yes; Fluid consistency: Thin; Fluid restriction: 1200 mL Fluid  Diet effective now              EDUCATION NEEDS:   No education needs have been identified at this time  Skin:  Skin Assessment: Reviewed RN Assessment  Last BM:  04/11/19  Height:   Ht Readings from Last 1 Encounters:  04/11/19 5\' 9"  (1.753 m)    Weight:   Wt Readings from Last 1 Encounters:  04/12/19 109.4 kg    BMI:  Body mass  index is 35.62 kg/m.  Estimated Nutritional Needs:   Kcal:  2050-2250  Protein:  130-140 grams  Fluid:  12102ml (per MD)   Larkin Ina, MS, RD, LDN RD pager number and weekend/on-call pager number located in [place].

## 2019-04-12 NOTE — Progress Notes (Signed)
PROGRESS NOTE  [name] POE:[id number] DOB: 1965/02/24   PCP: Forrest Moron, MD  Patient is from: Home.  Independently ambulates at baseline.  DOA: 04/11/2019 LOS: 1  Brief Narrative / Interim history: 54 year old male with solitary kidney after right nephrectomy for RCC?,  CKD-5 on list for transplant, HTN, anemia of renal disease, PUD and HLD presenting with complaints of shortness of breath, DOE, orthopnea, edema and headache.  Stated that he is on multiple medications which caused him headache. So stopped his medications.   In ED, SBP in 180s.  Saturation normal on RA.  Hgb 8.2 (baseline).  K6.1.  Bicarb 15.  Cr/BUN 7.9/81 (baseline).  HS trop negative x2.  LFT within normal.  Lipase 58.  COVID-19 negative.  BNP 752.  CXR concerning for mild cardiomegaly and pulm vasc congestion with trace bilateral pleural effusions.  Started on IV Lasix and oral labetalol, and admitted for volume overload due to CHF.   Nephrology consulted the next day.  Subjective: No major events overnight or this morning.  Reports some improvement in his breathing.  He denies nausea or vomiting.  He feels bloated in his abdomen.  He also reports generalized pain and headache.  He says he stopped his medications because they caused him headache.   Video interpreter with ID number [id number] used for this encounter.  Objective: Vitals:   04/12/19 0401 04/12/19 0403 04/12/19 0500 04/12/19 0843  BP:   133/88 (!) 184/98  Pulse:  81 80 85  Resp:  18    Temp: 97.8 F (36.6 C)  97.8 F (36.6 C)   TempSrc: Oral  Oral   SpO2:  100% 96%   Weight:   109.4 kg   Height:        Intake/Output Summary (Last 24 hours) at 04/12/2019 1135 Last data filed at 04/12/2019 0400 Gross per 24 hour  Intake 283.34 ml  Output 500 ml  Net -216.66 ml   Filed Weights   04/11/19 2159 04/12/19 0500  Weight: 109.4 kg 109.4 kg    Examination:  GENERAL: No acute distress.  Appears well.  HEENT: MMM.  Vision and hearing grossly  intact.  NECK: Supple.  No apparent JVD but difficult exam due to body habitus.  RESP: On RA no IWOB.  Fair aeration.  Bibasilar crackles. CVS:  RRR. Heart sounds normal.  ABD/GI/GU: Bowel sounds present. Soft.  RUQ tenderness.  Positive Murphy sign MSK/EXT:  Moves extremities. No apparent deformity.  Trace edema bilaterally SKIN: no apparent skin lesion or wound NEURO: Awake, alert and oriented appropriately.  No apparent focal neuro deficit. PSYCH: Calm. Normal affect.   Procedures:  None  Assessment & Plan: Acute combined CHF: Presents with cardinal symptoms.  Echo with EF of 35 to 40% (60 to 65% in 08/2018), global hypokinesis, G2 DD, moderate LAE and RAE and aortic dilation.  Has trace edema with bibasilar crackles on exam.  About 500 cc urine output overnight.  Renal function is stable. -Nephrology consulted for assistance given his advanced CKD and solitary kidney -Consulted cardiology -May need GDMT -Monitor fluid status, renal function and electrolytes -Sodium and fluid restrictions  CKD-5/solitary kidney s/p right nephrectomy for RCC?/Metabolic acidosis/azotemia/hyperkalemia:  Creatinine and BUN at baseline.  Followed at [facility] for potential transplant -Nephrology consulted  Abdominal pain/nausea/vomiting: He denies nausea or vomiting but reports abdominal bloating and pain.  Has RUQ tenderness with positive Murphy.  Lipase and LFTs within normal.  HIDA on 2/19 normal.  This could be due to  CHF/fluid overload.  RUQ ultrasound without significant finding. -RUQ ultrasound to exclude gallbladder etiology -Continue Protonix, GI cocktail and antiemetics  Anemia of renal disease: Baseline Hgb 8-9> 8.2 (admit)> .  Iron sats low at 12% on 2/4. -IV iron and ESA per nephrology  Uncontrolled hypertension: Likely due to fluid overload and stopping his medications.  Improved. -Change labetalol to Coreg-in the setting of systolic CHF  Headache: reports association with his  medication.  Due to hypotension?  No red flags. -As needed Tylenol -Change labetalol to Coreg in the setting of systolic CHF -Orthostatic vitals  Class II obesity: BMI 35.62. -Check A1c lipid panel -Encourage lifestyle change to lose weight.                DVT prophylaxis: SCD.  Avoids pork-drived products due to religious affiliation Code Status: Full code Family Communication: Patient and/or RN. Available if any question.  Discharge barrier: Acute CHF in patient with CKD-5 and solitary kidney  Patient is from: Home Final disposition: Likely home when medically stable and cleared by consultants  Consultants: Nephrology, cardiology   Microbiology summarized: COVID-19 negative  Sch Meds:  Scheduled Meds: . calcium acetate  1,334 mg Oral TID WC  . labetalol  400 mg Oral BID  . pantoprazole (PROTONIX) IV  40 mg Intravenous Q24H  . sodium bicarbonate  650 mg Oral BID  . sodium chloride flush  3 mL Intravenous Once  . sodium chloride flush  3 mL Intravenous Q12H   Continuous Infusions: . sodium chloride     PRN Meds:.sodium chloride, acetaminophen, alum & mag hydroxide-simeth **AND** lidocaine, ondansetron (ZOFRAN) IV, sodium chloride flush  Antimicrobials: Anti-infectives (From admission, onward)   None       I have personally reviewed the following labs and images: CBC: Recent Labs  Lab 04/10/19 1411 04/11/19 1500  WBC  --  6.0  HGB 8.6* 8.2*  HCT  --  27.4*  MCV  --  90.1  PLT  --  231   BMP &GFR Recent Labs  Lab 04/11/19 1500 04/11/19 2212 04/12/19 0320  NA 139 140 140  K 6.1* 5.0 5.7*  CL 111 114* 113*  CO2 15* 14* 15*  GLUCOSE 114* 135* 120*  BUN 81* 82* 82*  CREATININE 7.96* 7.98* 7.91*  CALCIUM 6.9* 6.8* 6.8*  PHOS  --   --  6.9*   Estimated Creatinine Clearance: 13 mL/min (A) (by C-G formula based on SCr of 7.91 mg/dL (H)). Liver & Pancreas: Recent Labs  Lab 04/11/19 1700 04/12/19 0320  AST 12*  --   ALT 18  --   ALKPHOS 72   --   BILITOT 0.6  --   PROT 6.2*  --   ALBUMIN 3.4* 3.0*   Recent Labs  Lab 04/11/19 1700  LIPASE 58*   No results for input(s): AMMONIA in the last 168 hours. Diabetic: No results for input(s): HGBA1C in the last 72 hours. No results for input(s): GLUCAP in the last 168 hours. Cardiac Enzymes: No results for input(s): CKTOTAL, CKMB, CKMBINDEX, TROPONINI in the last 168 hours. No results for input(s): PROBNP in the last 8760 hours. Coagulation Profile: No results for input(s): INR, PROTIME in the last 168 hours. Thyroid Function Tests: No results for input(s): TSH, T4TOTAL, FREET4, T3FREE, THYROIDAB in the last 72 hours. Lipid Profile: No results for input(s): CHOL, HDL, LDLCALC, TRIG, CHOLHDL, LDLDIRECT in the last 72 hours. Anemia Panel: No results for input(s): VITAMINB12, FOLATE, FERRITIN, TIBC, IRON, RETICCTPCT in the last  72 hours. Urine analysis:    Component Value Date/Time   COLORURINE STRAW (A) 08/25/2018 1436   APPEARANCEUR CLEAR 08/25/2018 1436   LABSPEC 1.011 08/25/2018 1436   PHURINE 6.0 08/25/2018 1436   GLUCOSEU NEGATIVE 08/25/2018 1436   HGBUR NEGATIVE 08/25/2018 1436   BILIRUBINUR negative 02/06/2019 1529   BILIRUBINUR neg 03/12/2013 1058   KETONESUR negative 02/06/2019 1529   KETONESUR NEGATIVE 08/25/2018 1436   PROTEINUR >=300 (A) 02/06/2019 1529   PROTEINUR >=300 (A) 08/25/2018 1436   UROBILINOGEN 0.2 02/06/2019 1529   UROBILINOGEN 0.2 07/21/2012 1235   NITRITE Negative 02/06/2019 1529   NITRITE NEGATIVE 08/25/2018 1436   LEUKOCYTESUR Negative 02/06/2019 1529   LEUKOCYTESUR NEGATIVE 08/25/2018 1436   Sepsis Labs: Invalid input(s): PROCALCITONIN, [place]  Microbiology: Recent Results (from the past 240 hour(s))  SARS CORONAVIRUS 2 (TAT 6-24 HRS) Nasopharyngeal Nasopharyngeal Swab     Status: None   Collection Time: 04/11/19  9:01 PM   Specimen: Nasopharyngeal Swab  Result Value Ref Range Status   SARS Coronavirus 2 NEGATIVE NEGATIVE  Final    Comment: (NOTE) SARS-CoV-2 target nucleic acids are NOT DETECTED. The SARS-CoV-2 RNA is generally detectable in upper and lower respiratory specimens during the acute phase of infection. Negative results do not preclude SARS-CoV-2 infection, do not rule out co-infections with other pathogens, and should not be used as the sole basis for treatment or other patient management decisions. Negative results must be combined with clinical observations, patient history, and epidemiological information. The expected result is Negative. Fact Sheet for Patients: SugarRoll.be Fact Sheet for Healthcare Providers: https://www.woods-mathews.com/ This test is not yet approved or cleared by the [place] FDA and  has been authorized for detection and/or diagnosis of SARS-CoV-2 by FDA under an Emergency Use Authorization (EUA). This EUA will remain  in effect (meaning this test can be used) for the duration of the COVID-19 declaration under Section 56 4(b)(1) of the Act, 21 U.S.C. section 360bbb-3(b)(1), unless the authorization is terminated or revoked sooner. Performed at [facility], [place] [address] [place], [place] [address]     Radiology Studies: DG Chest 2 View  Result Date: 04/11/2019 CLINICAL DATA:  Chest pain and shortness of breath. EXAM: CHEST - 2 VIEW COMPARISON:  Chest x-ray dated November 26, 2018. FINDINGS: New mild cardiomegaly and pulmonary vascular congestion. New trace bilateral pleural effusions. No consolidation or pneumothorax. No acute osseous abnormality. IMPRESSION: New mild congestive heart failure. Electronically Signed   By: Titus Dubin M.D.   On: 04/11/2019 15:12   ECHOCARDIOGRAM COMPLETE  Result Date: 04/12/2019    ECHOCARDIOGRAM REPORT   Patient Name:   [name] Date of Exam: 04/12/2019 Medical Rec #:  [id number]  Height:       69.0 in Accession #:    [id number] Weight:       241.2 lb Date of Birth:   November 09, 1965   BSA:          2.237 m Patient Age:    40 years   BP:           184/98 mmHg Patient Gender: M          HR:           85 bpm. Exam Location:  Inpatient Procedure: 2D Echo and Strain Analysis Indications:    CHF-Acute Diastolic 694.85 / I62.70  History:        Patient has prior history of Echocardiogram examinations, most  recent 08/26/2018. Risk Factors:Hypertension, Diabetes and Sleep                 Apnea. Chronic kidney disease.  Sonographer:    Darlina Sicilian RDCS Referring Phys: [id number] [place]  1. Low normal GLS -14.9 EF has decreased since echo done July 2020 . Left ventricular ejection fraction, by estimation, is 35 to 40%. The left ventricle has moderately decreased function. The left ventricle demonstrates global hypokinesis. The left ventricular internal cavity size was moderately dilated. Left ventricular diastolic parameters are consistent with Grade II diastolic dysfunction (pseudonormalization). Elevated left ventricular end-diastolic pressure.  2. Right ventricular systolic function is normal. The right ventricular size is normal. There is moderately elevated pulmonary artery systolic pressure.  3. Left atrial size was moderately dilated.  4. Right atrial size was moderately dilated.  5. The mitral valve is normal in structure and function. Mild to moderate mitral valve regurgitation. No evidence of mitral stenosis.  6. The aortic valve is normal in structure and function. Aortic valve regurgitation is not visualized. No aortic stenosis is present.  7. Aortic dilatation noted. There is mild dilatation of the aortic root measuring 40 mm.  8. The inferior vena cava is normal in size with greater than 50% respiratory variability, suggesting right atrial pressure of 3 mmHg. FINDINGS  Left Ventricle: Low normal GLS -14.9 EF has decreased since echo done July 2020. Left ventricular ejection fraction, by estimation, is 35 to 40%. The left ventricle has  moderately decreased function. The left ventricle demonstrates global hypokinesis. The left ventricular internal cavity size was moderately dilated. There is no left ventricular hypertrophy. Left ventricular diastolic parameters are consistent with Grade II diastolic dysfunction (pseudonormalization). Elevated left ventricular end-diastolic pressure. Right Ventricle: The right ventricular size is normal. No increase in right ventricular wall thickness. Right ventricular systolic function is normal. There is moderately elevated pulmonary artery systolic pressure. The tricuspid regurgitant velocity is 3.39 m/s, and with an assumed right atrial pressure of 8 mmHg, the estimated right ventricular systolic pressure is 19.6 mmHg. Left Atrium: Left atrial size was moderately dilated. Right Atrium: Right atrial size was moderately dilated. Pericardium: A small pericardial effusion is present. The pericardial effusion is posterior to the left ventricle. Mitral Valve: The mitral valve is normal in structure and function. There is mild thickening of the mitral valve leaflet(s). There is mild calcification of the mitral valve leaflet(s). Normal mobility of the mitral valve leaflets. Mild to moderate mitral  valve regurgitation. No evidence of mitral valve stenosis. Tricuspid Valve: The tricuspid valve is normal in structure. Tricuspid valve regurgitation is mild . No evidence of tricuspid stenosis. Aortic Valve: The aortic valve is normal in structure and function. Aortic valve regurgitation is not visualized. No aortic stenosis is present. Pulmonic Valve: The pulmonic valve was normal in structure. Pulmonic valve regurgitation is trivial. No evidence of pulmonic stenosis. Aorta: The aortic root is normal in size and structure and aortic dilatation noted. There is mild dilatation of the aortic root measuring 40 mm. Venous: The inferior vena cava is normal in size with greater than 50% respiratory variability, suggesting right  atrial pressure of 3 mmHg. IAS/Shunts: No atrial level shunt detected by color flow Doppler.  LEFT VENTRICLE PLAX 2D LVIDd:         5.60 cm      Diastology LVIDs:         4.02 cm      LV e' lateral:   7.62 cm/s LV PW:  1.06 cm      LV E/e' lateral: 16.6 LV IVS:        0.98 cm      LV e' medial:    6.09 cm/s LVOT diam:     2.10 cm      LV E/e' medial:  20.8 LV SV:         77 LV SV Index:   34 LVOT Area:     3.46 cm  LV Volumes (MOD) LV vol d, MOD A2C: 151.5 ml LV vol d, MOD A4C: 187.5 ml LV vol s, MOD A2C: 96.7 ml LV vol s, MOD A4C: 94.0 ml LV SV MOD A2C:     54.8 ml LV SV MOD A4C:     187.5 ml LV SV MOD BP:      79.9 ml RIGHT VENTRICLE RV S prime:     11.90 cm/s TAPSE (M-mode): 2.3 cm LEFT ATRIUM              Index       RIGHT ATRIUM           Index LA diam:        4.60 cm  2.06 cm/m  RA Area:     24.60 cm LA Vol (A2C):   126.0 ml 56.32 ml/m RA Volume:   79.50 ml  35.54 ml/m LA Vol (A4C):   93.6 ml  41.84 ml/m LA Biplane Vol: 110.0 ml 49.17 ml/m  AORTIC VALVE LVOT Vmax:   119.00 cm/s LVOT Vmean:  81.900 cm/s LVOT VTI:    0.221 m  AORTA Ao Root diam: 3.60 cm Ao Asc diam:  4.00 cm MITRAL VALVE                 TRICUSPID VALVE MV Area (PHT): 6.05 cm      TR Peak grad:   46.0 mmHg MV Decel Time: 125 msec      TR Vmax:        339.00 cm/s MR Peak grad:    144.5 mmHg MR Mean grad:    95.0 mmHg   SHUNTS MR Vmax:         601.00 cm/s Systemic VTI:  0.22 m MR Vmean:        460.0 cm/s  Systemic Diam: 2.10 cm MR PISA:         0.25 cm MR PISA Eff ROA: 2 mm MR PISA Radius:  0.20 cm MV E velocity: 126.67 cm/s MV A velocity: 60.20 cm/s MV E/A ratio:  2.10 Jenkins Rouge MD Electronically signed by Jenkins Rouge MD Signature Date/Time: 04/12/2019/9:52:33 AM    Final    US Abdomen Limited RUQ  Result Date: 04/12/2019 CLINICAL DATA:  Abdominal pain EXAM: ULTRASOUND ABDOMEN LIMITED RIGHT UPPER QUADRANT COMPARISON:  CT abdomen pelvis 03/01/2019 FINDINGS: Gallbladder: No gallstones or wall thickening visualized. No  sonographic Murphy sign noted by sonographer. Common bile duct: Diameter: 2.9 mm Liver: Mild increased echogenicity liver without focal liver lesion. Portal vein is patent on color Doppler imaging with normal direction of blood flow towards the liver. Other: None. IMPRESSION: Negative for gallstones or biliary dilatation. Mildly echogenic liver. Electronically Signed   By: Franchot Gallo M.D.   On: 04/12/2019 11:32     Master [address] [place]  If 7PM-7AM, please contact night-coverage [email or web address] Password [facility] 04/12/2019, 11:35 AM

## 2019-04-12 NOTE — Consult Note (Signed)
Cardiology Consultation:   Patient ID: Kayon Dozier MRN: [id number]; DOB: 1965-06-26  Admit date: 04/11/2019 Date of Consult: 04/12/2019  Primary Care Provider: Forrest Moron, MD Primary Cardiologist: Ena Dawley, MD  Primary Electrophysiologist:  None    Patient Profile:   Tyreese Thain is a 54 y.o. male with a hx of HTN, hyperlipidemia, CKD V pending initiation of HD in March, who is being seen today for the evaluation of acute heart failure exacerbation with newly reduced EF at the request of Dr. Cyndia Skeeters.  History of Present Illness:   Mr. Claytor receives his cardiology care from Dr. [name] and has also been followed in the lipid clinic for management of hypertriglyceridemia.   Patient presented to the ED on 2/26 for 1 week of progressive DOE and orthopnea. He states this has been an issue for him for several months, but got much worse over the last week. He also struggles with chronic nausea, headaches, abdominal pain and decreased appetite. He stopped taking all of his medications about 6 days ago thinking they may be contributing to his symptoms. He does not regularly check his weight. He denies any fevers, chills, light headedness, URI symptoms, chest pain, changes in stools or urinary symptoms.   He is followed by [facility]. Had a fistula creation in his LUE last November in anticipation for starting dialysis. He has follow-up with vascular on 3/9 with plan to start HD thereafter.   In the ED, he was found to be hypertensive, but otherwise stable. CBC with hgb 8.2 which is at his baseline. BMP with potassium of 6.1, bicarb 15, anion gap 13, BUN 81, creatinine 7.9. High sensitivity troponins negative x2. EKG was without ischemic changes. BNP elevated at 752. CXR showed bilateral pleural effusions and vascular congestion. Echo showed reduced EF of 35-40% which is new compared to echo in July of 2020, as well as grade II diastolic dysfunction. He received a dose of IV Lasix 40 mg with  500 cc urine output. Cardiology was consulted for further recommendations regarding newly reduced EF.    Heart Pathway Score:     Past Medical History:  Diagnosis Date  . Anemia   . Back pain   . CKD (chronic kidney disease), stage III    Stage 4  . Colon polyps    adenomatous  . Elevated cholesterol   . History of echocardiogram    Echo 11/17: EF 65-70, normal wall motion, grade 1 diastolic dysfunction, trivial MR, mild LAE, mild TR  . Hyperlipidemia   . Hypertension    no current bp meds for last 3 months  . Kidney stones 2007  . Medication intolerance    a. multiple with prior nonadherence to regimen.  . Nausea    " in the morning since having the kidney problem "  . Otosclerosis of both ears   . Pneumonia   . PONV (postoperative nausea and vomiting)   . PUD (peptic ulcer disease)    Has had unspecified surgery for this  . Sleep apnea     Past Surgical History:  Procedure Laterality Date  . AV FISTULA PLACEMENT Left 01/04/2019   Procedure: ARTERIOVENOUS (AV) FISTULA CREATION LEFT ARM;  Surgeon: [name], MD;  Location: [facility];  Service: Vascular;  Laterality: Left;  . BASCILIC VEIN TRANSPOSITION Left 03/11/2019   Procedure: BASILIC VEIN TRANSPOSITION 2ND STAGE LEFT;  Surgeon: [name], MD;  Location: [facility];  Service: Vascular;  Laterality: Left;  . NEPHRECTOMY  02/18/2011  Procedure: NEPHRECTOMY;  Surgeon: Hanley Ben, MD;  Location: [facility];  Service: Urology;  Laterality: Right;  . SMALL INTESTINE SURGERY    . STAPEDOTOMY  2005   lt ear jan, right ear sept  . surgery for ulcers  1990     Home Medications:  Prior to Admission medications   Medication Sig Start Date End Date Taking? Authorizing Provider  calcium acetate (PHOSLO) 667 MG capsule Take 2 capsules (1,334 mg total) by mouth 3 (three) times daily with meals. Patient not taking: Reported on 03/28/2019 09/26/18   Forrest Moron, MD  furosemide (LASIX) 40 MG tablet Take 1 tablet (40  mg total) by mouth daily. Patient not taking: Reported on 04/11/2019 09/26/18   Forrest Moron, MD  HYDROcodone-acetaminophen (NORCO/VICODIN) 5-325 MG tablet Take 1 tablet by mouth every 4 (four) hours as needed for moderate pain. Patient not taking: Reported on 04/11/2019 03/11/19 03/10/20  Barbie Banner, PA-C  labetalol (NORMODYNE) 200 MG tablet Take 2 tablets (400 mg total) by mouth 2 (two) times daily. Patient not taking: Reported on 04/11/2019 08/27/18   Eugenie Filler, MD  pantoprazole (PROTONIX) 40 MG tablet Take 1 tablet (40 mg total) by mouth daily. Patient not taking: Reported on 04/11/2019 03/20/19   Loralie Champagne, PA-C    Inpatient Medications: Scheduled Meds: . calcium acetate  1,334 mg Oral TID WC  . carvedilol  25 mg Oral BID WC  . pantoprazole (PROTONIX) IV  40 mg Intravenous Q24H  . sodium bicarbonate  650 mg Oral BID  . sodium chloride flush  3 mL Intravenous Once  . sodium chloride flush  3 mL Intravenous Q12H   Continuous Infusions: . sodium chloride     PRN Meds: sodium chloride, acetaminophen, alum & mag hydroxide-simeth **AND** lidocaine, ondansetron (ZOFRAN) IV, sodium chloride flush  Allergies:    Allergies  Allergen Reactions  . Lokelma [Sodium Zirconium Cyclosilicate] Shortness Of Breath  . Beef-Derived Products Other (See Comments)    Cultural preference  . Pegademase Bovine Other (See Comments)    Cultural preference  . Poractant Alfa Other (See Comments)    Cultural preference  . Pork-Derived Products Other (See Comments)    Cultural preference    Social History:   Social History   Socioeconomic History  . Marital status: Married    Spouse name: Edgardo Roys  . Number of children: 5  . Years of education: Not on file  . Highest education level: 12th grade  Occupational History    Employer: [employer]  Tobacco Use  . Smoking status: Never Smoker  . Smokeless tobacco: Never Used  Substance and Sexual Activity  . Alcohol use: No  .  Drug use: No  . Sexual activity: Yes    Birth control/protection: None  Other Topics Concern  . Not on file  Social History Narrative   Lives at home with wife and family. He is from [place]. Came to the [place] in 2002.      Patient is right-handed. He lives in a one level home. He drinks 1-2 cups of coffee and tea a day. He does not exercise.   Social Determinants of Health   Financial Resource Strain: Low Risk   . Difficulty of Paying Living Expenses: Not hard at all  Food Insecurity: No Food Insecurity  . Worried About Charity fundraiser in the Last Year: Never true  . Ran Out of Food in the Last Year: Never true  Transportation Needs: No Transportation Needs  .  Lack of Transportation (Medical): No  . Lack of Transportation (Non-Medical): No  Physical Activity: Inactive  . Days of Exercise per Week: 0 days  . Minutes of Exercise per Session: 0 min  Stress: No Stress Concern Present  . Feeling of Stress : Not at all  Social Connections: Slightly Isolated  . Frequency of Communication with Friends and Family: More than three times a week  . Frequency of Social Gatherings with Friends and Family: More than three times a week  . Attends Religious Services: More than 4 times per year  . Active Member of Clubs or Organizations: No  . Attends Archivist Meetings: Never  . Marital Status: Married  Human resources officer Violence: Not At Risk  . Fear of Current or Ex-Partner: No  . Emotionally Abused: No  . Physically Abused: No  . Sexually Abused: No    Family History:    Family History  Problem Relation Age of Onset  . Hyperlipidemia Father   . Heart disease Mother   . Hypertension Mother   . Hypertension Sister   . Heart disease Brother 23       CABG  . Hyperlipidemia Sister   . Hyperlipidemia Sister   . Esophageal cancer Cousin   . Healthy Child   . Colon cancer Neg Hx   . Stomach cancer Neg Hx   . Rectal cancer Neg Hx      ROS:  Please see the history of  present illness.  All other ROS reviewed and negative.     Physical Exam/Data:   Vitals:   04/12/19 0401 04/12/19 0403 04/12/19 0500 04/12/19 0843  BP:   133/88 (!) 184/98  Pulse:  81 80 85  Resp:  18    Temp: 97.8 F (36.6 C)  97.8 F (36.6 C)   TempSrc: Oral  Oral   SpO2:  100% 96%   Weight:   109.4 kg   Height:        Intake/Output Summary (Last 24 hours) at 04/12/2019 1319 Last data filed at 04/12/2019 0400 Gross per 24 hour  Intake 283.34 ml  Output 500 ml  Net -216.66 ml   Last 3 Weights 04/12/2019 04/11/2019 03/28/2019  Weight (lbs) 241 lb 2.9 oz 241 lb 1.6 oz 244 lb  Weight (kg) 109.4 kg 109.362 kg 110.678 kg     Body mass index is 35.62 kg/m.  General:  Well nourished, well developed, in no acute distress HEENT: normal Lymph: no adenopathy Neck: no JVD Endocrine:  No thryomegaly Vascular: No carotid bruits; FA pulses 2+ bilaterally without bruits. Left AVF with palpable thrill  Cardiac:  normal S1, S2; RRR; no murmur Lungs:  clear to auscultation bilaterally, no wheezing, rhonchi or rales  Abd: soft, nontender, no hepatomegaly  Ext: 1+ BLE edema  Musculoskeletal:  No deformities, BUE and BLE strength normal and equal Skin: warm and dry  Neuro:  CNs 2-12 intact, no focal abnormalities noted Psych:  Normal affect   EKG:  The EKG was personally reviewed and demonstrates:  Normal sinus rhythm without ischemic changes   Telemetry:  Telemetry was personally reviewed and demonstrates:  Sinus rhythm   Relevant CV Studies: Echo as above   Laboratory Data:  High Sensitivity Troponin:   Recent Labs  Lab 04/11/19 1500 04/11/19 1700  TROPONINIHS 15 17     Chemistry Recent Labs  Lab 04/11/19 1500 04/11/19 2212 04/12/19 0320  NA 139 140 140  K 6.1* 5.0 5.7*  CL 111 114* 113*  CO2 15* 14* 15*  GLUCOSE 114* 135* 120*  BUN 81* 82* 82*  CREATININE 7.96* 7.98* 7.91*  CALCIUM 6.9* 6.8* 6.8*  GFRNONAA 7* 7* 7*  GFRAA 8* 8* 8*  ANIONGAP 13 12 12     Recent  Labs  Lab 04/11/19 1700 04/12/19 0320  PROT 6.2*  --   ALBUMIN 3.4* 3.0*  AST 12*  --   ALT 18  --   ALKPHOS 72  --   BILITOT 0.6  --    Hematology Recent Labs  Lab 04/10/19 1411 04/11/19 1500  WBC  --  6.0  RBC  --  3.04*  HGB 8.6* 8.2*  HCT  --  27.4*  MCV  --  90.1  MCH  --  27.0  MCHC  --  29.9*  RDW  --  14.9  PLT  --  231   BNP Recent Labs  Lab 04/11/19 1537  BNP 752.1*    DDimer No results for input(s): DDIMER in the last 168 hours.   Radiology/Studies:  DG Chest 2 View  Result Date: 04/11/2019 CLINICAL DATA:  Chest pain and shortness of breath. EXAM: CHEST - 2 VIEW COMPARISON:  Chest x-ray dated November 26, 2018. FINDINGS: New mild cardiomegaly and pulmonary vascular congestion. New trace bilateral pleural effusions. No consolidation or pneumothorax. No acute osseous abnormality. IMPRESSION: New mild congestive heart failure. Electronically Signed   By: Titus Dubin M.D.   On: 04/11/2019 15:12   ECHOCARDIOGRAM COMPLETE  Result Date: 04/12/2019    ECHOCARDIOGRAM REPORT   Patient Name:   MELFORD TULLIER Date of Exam: 04/12/2019 Medical Rec #:  [id number]  Height:       69.0 in Accession #:    [id number] Weight:       241.2 lb Date of Birth:  [birth year]   BSA:          2.237 m Patient Age:    73 years   BP:           184/98 mmHg Patient Gender: M          HR:           85 bpm. Exam Location:  Inpatient Procedure: 2D Echo and Strain Analysis Indications:    CHF-Acute Diastolic 151.76 / H60.73  History:        Patient has prior history of Echocardiogram examinations, most                 recent 08/26/2018. Risk Factors:Hypertension, Diabetes and Sleep                 Apnea. Chronic kidney disease.  Sonographer:    Darlina Sicilian RDCS Referring Phys: [id number] [place]  1. Low normal GLS -14.9 EF has decreased since echo done July 2020 . Left ventricular ejection fraction, by estimation, is 35 to 40%. The left ventricle has moderately decreased function. The  left ventricle demonstrates global hypokinesis. The left ventricular internal cavity size was moderately dilated. Left ventricular diastolic parameters are consistent with Grade II diastolic dysfunction (pseudonormalization). Elevated left ventricular end-diastolic pressure.  2. Right ventricular systolic function is normal. The right ventricular size is normal. There is moderately elevated pulmonary artery systolic pressure.  3. Left atrial size was moderately dilated.  4. Right atrial size was moderately dilated.  5. The mitral valve is normal in structure and function. Mild to moderate mitral valve regurgitation. No evidence of mitral stenosis.  6. The aortic valve is normal in structure and function. Aortic  valve regurgitation is not visualized. No aortic stenosis is present.  7. Aortic dilatation noted. There is mild dilatation of the aortic root measuring 40 mm.  8. The inferior vena cava is normal in size with greater than 50% respiratory variability, suggesting right atrial pressure of 3 mmHg. FINDINGS  Left Ventricle: Low normal GLS -14.9 EF has decreased since echo done July 2020. Left ventricular ejection fraction, by estimation, is 35 to 40%. The left ventricle has moderately decreased function. The left ventricle demonstrates global hypokinesis. The left ventricular internal cavity size was moderately dilated. There is no left ventricular hypertrophy. Left ventricular diastolic parameters are consistent with Grade II diastolic dysfunction (pseudonormalization). Elevated left ventricular end-diastolic pressure. Right Ventricle: The right ventricular size is normal. No increase in right ventricular wall thickness. Right ventricular systolic function is normal. There is moderately elevated pulmonary artery systolic pressure. The tricuspid regurgitant velocity is 3.39 m/s, and with an assumed right atrial pressure of 8 mmHg, the estimated right ventricular systolic pressure is 22.0 mmHg. Left Atrium: Left  atrial size was moderately dilated. Right Atrium: Right atrial size was moderately dilated. Pericardium: A small pericardial effusion is present. The pericardial effusion is posterior to the left ventricle. Mitral Valve: The mitral valve is normal in structure and function. There is mild thickening of the mitral valve leaflet(s). There is mild calcification of the mitral valve leaflet(s). Normal mobility of the mitral valve leaflets. Mild to moderate mitral  valve regurgitation. No evidence of mitral valve stenosis. Tricuspid Valve: The tricuspid valve is normal in structure. Tricuspid valve regurgitation is mild . No evidence of tricuspid stenosis. Aortic Valve: The aortic valve is normal in structure and function. Aortic valve regurgitation is not visualized. No aortic stenosis is present. Pulmonic Valve: The pulmonic valve was normal in structure. Pulmonic valve regurgitation is trivial. No evidence of pulmonic stenosis. Aorta: The aortic root is normal in size and structure and aortic dilatation noted. There is mild dilatation of the aortic root measuring 40 mm. Venous: The inferior vena cava is normal in size with greater than 50% respiratory variability, suggesting right atrial pressure of 3 mmHg. IAS/Shunts: No atrial level shunt detected by color flow Doppler.  LEFT VENTRICLE PLAX 2D LVIDd:         5.60 cm      Diastology LVIDs:         4.02 cm      LV e' lateral:   7.62 cm/s LV PW:         1.06 cm      LV E/e' lateral: 16.6 LV IVS:        0.98 cm      LV e' medial:    6.09 cm/s LVOT diam:     2.10 cm      LV E/e' medial:  20.8 LV SV:         77 LV SV Index:   34 LVOT Area:     3.46 cm  LV Volumes (MOD) LV vol d, MOD A2C: 151.5 ml LV vol d, MOD A4C: 187.5 ml LV vol s, MOD A2C: 96.7 ml LV vol s, MOD A4C: 94.0 ml LV SV MOD A2C:     54.8 ml LV SV MOD A4C:     187.5 ml LV SV MOD BP:      79.9 ml RIGHT VENTRICLE RV S prime:     11.90 cm/s TAPSE (M-mode): 2.3 cm LEFT ATRIUM  Index       RIGHT ATRIUM            Index LA diam:        4.60 cm  2.06 cm/m  RA Area:     24.60 cm LA Vol (A2C):   126.0 ml 56.32 ml/m RA Volume:   79.50 ml  35.54 ml/m LA Vol (A4C):   93.6 ml  41.84 ml/m LA Biplane Vol: 110.0 ml 49.17 ml/m  AORTIC VALVE LVOT Vmax:   119.00 cm/s LVOT Vmean:  81.900 cm/s LVOT VTI:    0.221 m  AORTA Ao Root diam: 3.60 cm Ao Asc diam:  4.00 cm MITRAL VALVE                 TRICUSPID VALVE MV Area (PHT): 6.05 cm      TR Peak grad:   46.0 mmHg MV Decel Time: 125 msec      TR Vmax:        339.00 cm/s MR Peak grad:    144.5 mmHg MR Mean grad:    95.0 mmHg   SHUNTS MR Vmax:         601.00 cm/s Systemic VTI:  0.22 m MR Vmean:        460.0 cm/s  Systemic Diam: 2.10 cm MR PISA:         0.25 cm MR PISA Eff ROA: 2 mm MR PISA Radius:  0.20 cm MV E velocity: 126.67 cm/s MV A velocity: 60.20 cm/s MV E/A ratio:  2.10 Jenkins Rouge MD Electronically signed by Jenkins Rouge MD Signature Date/Time: 04/12/2019/9:52:33 AM    Final    US Abdomen Limited RUQ  Result Date: 04/12/2019 CLINICAL DATA:  Abdominal pain EXAM: ULTRASOUND ABDOMEN LIMITED RIGHT UPPER QUADRANT COMPARISON:  CT abdomen pelvis 03/01/2019 FINDINGS: Gallbladder: No gallstones or wall thickening visualized. No sonographic Murphy sign noted by sonographer. Common bile duct: Diameter: 2.9 mm Liver: Mild increased echogenicity liver without focal liver lesion. Portal vein is patent on color Doppler imaging with normal direction of blood flow towards the liver. Other: None. IMPRESSION: Negative for gallstones or biliary dilatation. Mildly echogenic liver. Electronically Signed   By: Franchot Gallo M.D.   On: 04/12/2019 11:32    Assessment and Plan:   1. Acute combined heart failure: complicated by end-stage renal disease. Could do trial of high-dose IV lasix to see if he responds. However, patient appears to be having progressive uremic symptoms and may end up requiring initiation of dialysis this hospitalization. Goal directed medical therapy also limited  by renal disease. Agree with initiation of Coreg. Will also add BiDil for better blood pressure control. Regarding his newly reduced EF, he likely needs nonurgent ischemic eval, as he denies any anginal symptoms, and ACS work-up negative on arrival. His echo also did not show any regional wall motion abnormalities.This could be done as a repeat stress test, versus waiting to do a contrast study once he starts HD.      For questions or updates, please contact [place] Please consult [email or web address] for contact info under     Signed, Delice Bison, DO  04/12/2019 1:19 PM

## 2019-04-12 NOTE — Consult Note (Signed)
[facility] Renal Consultation Note  Requesting MD: Wendee Beavers MD  Indication for Consultation:  CKD   Chief complaint: Progressive shortness of breath  HPI:  [name] is a 54 y.o. male with a history including advanced CKD, chronic diastolic CHF, and hypertension who presented to the hospital with one week of increased dyspnea on exertion.  Spoke with the patient via an Arabic interpreter; the interpreter was accessed through the computer at his bedside.  He has also had issues with nausea and dry heaving.  He has not been able to take his medicines for about a week he states creatinine is 7.91 on consult and BUN is 82 and K 5.7.  Nephrology is consulted for assistance with management.  He follows with [name] at [facility].  He has been on Epogen 10,000 unit dose every two weeks. He underwent left first stage basilic vein transposition on 01/04/19 and had left second stage basilic vein transposition with Dr. Carlis Abbott on 03/11/19.  Note Cr 2/3 was 7.16 with BUN 67 and K 5.9, bicarb 15, PTH 444.  He was started on daily lokelma and took several doses and stopped about a week ago given his nausea. Had 500 mL UOP charted thus far today.  Received lasix 40 mg IV last night x 1 dose.  Has been on room air.  He states that his breathing has gotten better since he has been here.  Note that cardiology is ultimately planning for ischemia evaluation  He has had trouble with headaches for years and he associates these with medications - not sure which.  He's frustrated they haven't been able to figure out the cause.  We discussed the risks and benefits and indications of hemodialysis and he is willing to start dialysis if his fistula is ready.  If not he is willing to do whatever is recommended.  Creatinine, Ser  Date/Time Value Ref Range Status  04/12/2019 03:20 AM 7.91 (H) 0.61 - 1.24 mg/dL Final  04/11/2019 10:12 PM 7.98 (H) 0.61 - 1.24 mg/dL Final  04/11/2019 03:00 PM 7.96 (H) 0.61 -  1.24 mg/dL Final  03/11/2019 07:29 AM 8.90 (H) 0.61 - 1.24 mg/dL Final  01/04/2019 09:31 AM 8.50 (H) 0.61 - 1.24 mg/dL Final  10/16/2018 10:30 AM 5.43 (HH) 0.76 - 1.27 mg/dL Final    Comment:                      Client Requested Flag  09/12/2018 05:19 PM 4.34 (HH) 0.76 - 1.27 mg/dL Final    Comment:                      Client Requested Flag  08/27/2018 04:21 AM 4.15 (H) 0.61 - 1.24 mg/dL Final  08/26/2018 05:53 PM 4.10 (H) 0.61 - 1.24 mg/dL Final  08/26/2018 03:53 AM 3.77 (H) 0.61 - 1.24 mg/dL Final  08/25/2018 02:12 PM 3.80 (H) 0.61 - 1.24 mg/dL Final  08/25/2018 02:03 PM 3.70 (H) 0.61 - 1.24 mg/dL Final  08/24/2018 02:58 PM 3.55 (H) 0.76 - 1.27 mg/dL Final  04/12/2017 11:08 AM 1.81 (H) 0.76 - 1.27 mg/dL Final  11/15/2016 02:10 PM 1.86 (H) 0.76 - 1.27 mg/dL Final  10/07/2016 10:32 AM 1.62 (H) 0.76 - 1.27 mg/dL Final  09/23/2016 03:21 PM 1.83 (H) 0.76 - 1.27 mg/dL Final  04/04/2016 01:22 PM 1.33 (H) 0.76 - 1.27 mg/dL Final  10/19/2015 05:03 PM 1.50 (H) 0.61 - 1.24 mg/dL Final  05/06/2014 12:09 PM 1.47  0.40 - 1.50 mg/dL Final  09/19/2013 10:49 AM 1.3 0.4 - 1.5 mg/dL Final  06/17/2013 03:28 PM 1.3 0.4 - 1.5 mg/dL Final  07/21/2012 12:35 PM 1.17 0.50 - 1.35 mg/dL Final  02/20/2011 03:40 PM 1.26 0.50 - 1.35 mg/dL Final  02/19/2011 05:36 AM 1.34 0.50 - 1.35 mg/dL Final  02/14/2011 03:25 PM 1.13 0.50 - 1.35 mg/dL Final  01/16/2011 06:00 AM 1.08 0.50 - 1.35 mg/dL Final  04/10/2010 07:12 AM 1.00 0.4 - 1.5 mg/dL Final  04/09/2010 07:50 PM 1.27 0.4 - 1.5 mg/dL Final    PMHx:   Past Medical History:  Diagnosis Date  . Anemia   . Back pain   . CKD (chronic kidney disease), stage III    Stage 4  . Colon polyps    adenomatous  . Elevated cholesterol   . History of echocardiogram    Echo 11/17: EF 65-70, normal wall motion, grade 1 diastolic dysfunction, trivial MR, mild LAE, mild TR  . Hyperlipidemia   . Hypertension    no current bp meds for last 3 months  . Kidney stones 2007   . Medication intolerance    a. multiple with prior nonadherence to regimen.  . Nausea    " in the morning since having the kidney problem "  . Otosclerosis of both ears   . Pneumonia   . PONV (postoperative nausea and vomiting)   . PUD (peptic ulcer disease)    Has had unspecified surgery for this  . Sleep apnea     Past Surgical History:  Procedure Laterality Date  . AV FISTULA PLACEMENT Left 01/04/2019   Procedure: ARTERIOVENOUS (AV) FISTULA CREATION LEFT ARM;  Surgeon: [name], MD;  Location: [facility];  Service: Vascular;  Laterality: Left;  . BASCILIC VEIN TRANSPOSITION Left 03/11/2019   Procedure: BASILIC VEIN TRANSPOSITION 2ND STAGE LEFT;  Surgeon: [name], MD;  Location: [facility];  Service: Vascular;  Laterality: Left;  . NEPHRECTOMY  02/18/2011   Procedure: NEPHRECTOMY;  Surgeon: Hanley Ben, MD;  Location: [facility];  Service: Urology;  Laterality: Right;  . SMALL INTESTINE SURGERY    . STAPEDOTOMY  2005   lt ear jan, right ear sept  . surgery for ulcers  1990    Family Hx:  Family History  Problem Relation Age of Onset  . Hyperlipidemia Father   . Heart disease Mother   . Hypertension Mother   . Hypertension Sister   . Heart disease Brother 40       CABG  . Hyperlipidemia Sister   . Hyperlipidemia Sister   . Esophageal cancer Cousin   . Healthy Child   . Colon cancer Neg Hx   . Stomach cancer Neg Hx   . Rectal cancer Neg Hx   No family history of kidney disease  Social History:  reports that he has never smoked. He has never used smokeless tobacco. He reports that he does not drink alcohol or use drugs.  Allergies:  Allergies  Allergen Reactions  . Lokelma [Sodium Zirconium Cyclosilicate] Shortness Of Breath  . Beef-Derived Products Other (See Comments)    Cultural preference  . Pegademase Bovine Other (See Comments)    Cultural preference  . Poractant Alfa Other (See Comments)    Cultural preference  . Pork-Derived Products Other  (See Comments)    Cultural preference    Medications: Prior to Admission medications   Medication Sig Start Date End Date Taking? Authorizing Provider  calcium acetate (PHOSLO) 667 MG capsule  Take 2 capsules (1,334 mg total) by mouth 3 (three) times daily with meals. Patient not taking: Reported on 03/28/2019 09/26/18   Forrest Moron, MD  furosemide (LASIX) 40 MG tablet Take 1 tablet (40 mg total) by mouth daily. Patient not taking: Reported on 04/11/2019 09/26/18   Forrest Moron, MD  HYDROcodone-acetaminophen (NORCO/VICODIN) 5-325 MG tablet Take 1 tablet by mouth every 4 (four) hours as needed for moderate pain. Patient not taking: Reported on 04/11/2019 03/11/19 03/10/20  Barbie Banner, PA-C  labetalol (NORMODYNE) 200 MG tablet Take 2 tablets (400 mg total) by mouth 2 (two) times daily. Patient not taking: Reported on 04/11/2019 08/27/18   Eugenie Filler, MD  pantoprazole (PROTONIX) 40 MG tablet Take 1 tablet (40 mg total) by mouth daily. Patient not taking: Reported on 04/11/2019 03/20/19   Zehr, Laban Emperor, PA-C    I have reviewed the patient's current and reported prior to admission medications.  Labs:  BMP Latest Ref Rng & Units 04/12/2019 04/11/2019 04/11/2019  Glucose 70 - 99 mg/dL 120(H) 135(H) 114(H)  BUN 6 - 20 mg/dL 82(H) 82(H) 81(H)  Creatinine 0.61 - 1.24 mg/dL 7.91(H) 7.98(H) 7.96(H)  BUN/Creat Ratio 9 - 20 - - -  Sodium 135 - 145 mmol/L 140 140 139  Potassium 3.5 - 5.1 mmol/L 5.7(H) 5.0 6.1(H)  Chloride 98 - 111 mmol/L 113(H) 114(H) 111  CO2 22 - 32 mmol/L 15(L) 14(L) 15(L)  Calcium 8.9 - 10.3 mg/dL 6.8(L) 6.8(L) 6.9(L)   ROS:  Pertinent items noted in HPI and remainder of comprehensive ROS otherwise negative.    Physical Exam: Vitals:   04/12/19 0843 04/12/19 1700  BP: (!) 184/98 (!) 147/108  Pulse: 85 85  Resp:    Temp:    SpO2:       General: Adult male in bed in no acute distress at rest HEENT: Normocephalic atraumatic Eyes: extraocular movements intact  sclera anicteric Neck: Supple trachea midline Heart: S1-S2 no rub Lungs: Lungs are clear on auscultation for me with normal work of breathing on room air Abdomen: Softly distended/obese habitus nontender Extremities: Trace pitting edema of lower extremities while legs are elevated no cyanosis or clubbing Skin: No rash on extremities exposed Neuro: Alert and oriented x3 conversant and follows commands  psych normal mood and affect Access left upper extremity AV fistula with bruit and thrill  Assessment/Plan:  # CKD stage V with concern for progression to end-stage disease  -Status post AV fistula creation as above.  With uremic symptoms which he states have been managed better here in the hospital - Will consult vascular in a.m. to assess if fistula is able to be used.  Has a good bruit and thrill - Plan for hemodialysis on 2/27 if his access is okay - Will make n.p.o. after midnight in the event intervention is needed for alternate access.   - Will need to be set up with an outpatient HD unit  # Chronic diastolic CHF -Lasix 80 mg IV once now - Continue other current regimen - Note that cardiology is ultimately planning for ischemia evaluation once on HD  # nausea  - c/w uremia  # Hypertension -Lasix once now  # Hyperkalemia -Lasix IV once now  # Metabolic acidosis -Lasix as above and plan for transition to hemodialysis  # Anemia of CKD  -He is on ESA as an outpatient - Epogen 10,000 units -last dose appears to be 2/24.  Not yet due.  # Hyperphosphatemia and secondary hyperparathyroidism -  Check intact PTH - Continue binder - Continue calcitriol.  On calcitriol 0.5 mcg daily per office notes   Claudia Desanctis 04/12/2019, 8:57 PM

## 2019-04-12 NOTE — Progress Notes (Signed)
Patient ID: [name], male   DOB: [birth year], 54 y.o.   MRN: [id number]  Echocardiogram 2D Echocardiogram has been performed.  Burt Knack, Jonelle Sidle M

## 2019-04-13 DIAGNOSIS — N186 End stage renal disease: Secondary | ICD-10-CM

## 2019-04-13 DIAGNOSIS — E7800 Pure hypercholesterolemia, unspecified: Secondary | ICD-10-CM

## 2019-04-13 DIAGNOSIS — I420 Dilated cardiomyopathy: Secondary | ICD-10-CM

## 2019-04-13 LAB — CBC
HCT: 25.4 % — ABNORMAL LOW (ref 39.0–52.0)
Hemoglobin: 7.9 g/dL — ABNORMAL LOW (ref 13.0–17.0)
MCH: 27.2 pg (ref 26.0–34.0)
MCHC: 31.1 g/dL (ref 30.0–36.0)
MCV: 87.6 fL (ref 80.0–100.0)
Platelets: 217 10*3/uL (ref 150–400)
RBC: 2.9 MIL/uL — ABNORMAL LOW (ref 4.22–5.81)
RDW: 14.7 % (ref 11.5–15.5)
WBC: 6.2 10*3/uL (ref 4.0–10.5)
nRBC: 0 % (ref 0.0–0.2)

## 2019-04-13 LAB — RENAL FUNCTION PANEL
Albumin: 3.1 g/dL — ABNORMAL LOW (ref 3.5–5.0)
Anion gap: 14 (ref 5–15)
BUN: 81 mg/dL — ABNORMAL HIGH (ref 6–20)
CO2: 16 mmol/L — ABNORMAL LOW (ref 22–32)
Calcium: 7.3 mg/dL — ABNORMAL LOW (ref 8.9–10.3)
Chloride: 108 mmol/L (ref 98–111)
Creatinine, Ser: 8.56 mg/dL — ABNORMAL HIGH (ref 0.61–1.24)
GFR calc Af Amer: 7 mL/min — ABNORMAL LOW (ref >60)
GFR calc non Af Amer: 6 mL/min — ABNORMAL LOW (ref >60)
Glucose, Bld: 128 mg/dL — ABNORMAL HIGH (ref 70–99)
Phosphorus: 6.8 mg/dL — ABNORMAL HIGH (ref 2.5–4.6)
Potassium: 5.4 mmol/L — ABNORMAL HIGH (ref 3.5–5.1)
Sodium: 138 mmol/L (ref 135–145)

## 2019-04-13 LAB — GLUCOSE, CAPILLARY: Glucose-Capillary: 87 mg/dL (ref 70–99)

## 2019-04-13 LAB — LIPID PANEL
Cholesterol: 179 mg/dL (ref 0–200)
HDL: 21 mg/dL — ABNORMAL LOW (ref >40)
LDL Cholesterol: 97 mg/dL (ref 0–99)
Total CHOL/HDL Ratio: 8.5 RATIO
Triglycerides: 305 mg/dL — ABNORMAL HIGH (ref <150)
VLDL: 61 mg/dL — ABNORMAL HIGH (ref 0–40)

## 2019-04-13 LAB — HEPATITIS B CORE ANTIBODY, TOTAL: Hep B Core Total Ab: NONREACTIVE

## 2019-04-13 LAB — HEMOGLOBIN A1C
Hgb A1c MFr Bld: 4.8 % (ref 4.8–5.6)
Mean Plasma Glucose: 91.06 mg/dL

## 2019-04-13 LAB — MAGNESIUM: Magnesium: 1.6 mg/dL — ABNORMAL LOW (ref 1.7–2.4)

## 2019-04-13 MED ORDER — CALCITRIOL 0.5 MCG PO CAPS
ORAL_CAPSULE | ORAL | Status: AC
  Filled 2019-04-13: qty 1

## 2019-04-13 MED ORDER — SUMATRIPTAN SUCCINATE 50 MG PO TABS
50.0000 mg | ORAL_TABLET | Freq: Once | ORAL | Status: AC
  Administered 2019-04-14: 50 mg via ORAL
  Filled 2019-04-13: qty 1

## 2019-04-13 MED ORDER — SODIUM CHLORIDE 0.9 % IV SOLN
510.0000 mg | Freq: Once | INTRAVENOUS | Status: AC
  Administered 2019-04-13: 510 mg via INTRAVENOUS
  Filled 2019-04-13: qty 17

## 2019-04-13 MED ORDER — CHLORHEXIDINE GLUCONATE CLOTH 2 % EX PADS
6.0000 | MEDICATED_PAD | Freq: Every day | CUTANEOUS | Status: DC
  Administered 2019-04-13: 6 via TOPICAL

## 2019-04-13 NOTE — Progress Notes (Signed)
   Patient now requiring dialysis has a left arm two-stage basilic vein fistula that is now 54 months old with most recent procedure 4 weeks ago.  Appears that fistula is ready for use.  Recommend using fistula as tolerated if cannot be cannulated would need TDC until fistula matures more.  He can otherwise follow-up with [place] on an as-needed basis.   Leonetta Mcgivern C. Donzetta Matters, MD [facility] Office: [phone] Pager: [phone]

## 2019-04-13 NOTE — Progress Notes (Addendum)
[facility] Progress Note  Name: [name] MRN: [id number] DOB: 12-20-1965  Chief Complaint:  Nausea and shortness of breath   Subjective: He had 1.3 liters UOP over 2/26 charted.  Hardly slept due to headache.  He is still willing to go ahead with HD. Hopes it helps him to feel better  Review of systems:  Has nausea Has been NPO in event intervention needed and able to be done today Shortness of breath better Urinating ok  ---------- Background on consult:  Fitz Matsuo is a 54 y.o. male with a history including advanced CKD, chronic diastolic CHF, and hypertension who presented to the hospital with one week of increased dyspnea on exertion.  Spoke with the patient via an Arabic interpreter; the interpreter was accessed through the computer at his bedside.  He has also had issues with nausea and dry heaving.  He has not been able to take his medicines for about a week he states creatinine is 7.91 on consult and BUN is 82 and K 5.7.  Nephrology is consulted for assistance with management.  He follows with [name] at [facility].  He has been on Epogen 10,000 unit dose every two weeks. He underwent left first stage basilic vein transposition on 01/04/19 and had left second stage basilic vein transposition with Dr. Carlis Abbott on 03/11/19.  Note Cr 2/3 was 7.16 with BUN 67 and K 5.9, bicarb 15, PTH 444.  He was started on daily lokelma and took several doses and stopped about a week ago given his nausea. Had 500 mL UOP charted thus far today.  Received lasix 40 mg IV last night x 1 dose.  Has been on room air.  He states that his breathing has gotten better since he has been here.  Note that cardiology is ultimately planning for ischemia evaluation.  He has had trouble with headaches for years and he associates these with medications - not sure which.  He's frustrated they haven't been able to figure out the cause.  We discussed the risks and benefits and indications of hemodialysis and  he is willing to start dialysis if his fistula is ready.  If not he is willing to do whatever is recommended.   Intake/Output Summary (Last 24 hours) at 04/13/2019 0749 Last data filed at 04/13/2019 0451 Gross per 24 hour  Intake --  Output 1320 ml  Net -1320 ml    Vitals:  Vitals:   04/12/19 1700 04/12/19 2038 04/12/19 2116 04/13/19 0431  BP: (!) 147/108 124/80 136/79 131/74  Pulse: 85 78  73  Resp:  15  17  Temp:  98.1 F (36.7 C)  98 F (36.7 C)  TempSrc:  Oral  Oral  SpO2:  100%  99%  Weight:    107.6 kg  Height:         Physical Exam:  General adult male in bed in no acute distress HEENT normocephalic atraumatic extraocular movements intact sclera anicteric Neck supple trachea midline Lungs clear to auscultation bilaterally normal work of breathing at rest  Heart regular rate and rhythm no rubs or gallops appreciated Abdomen softly dist obese habitus  Extremities no edema; no cyanosis or clubbing Psych normal mood and affect Neuro - alert and oriented x 3 follows commands and provides hx  Access LUE AVF bruit and thrill    Medications reviewed   Labs:  BMP Latest Ref Rng & Units 04/13/2019 04/12/2019 04/11/2019  Glucose 70 - 99 mg/dL 128(H) 120(H) 135(H)  BUN 6 -  20 mg/dL 81(H) 82(H) 82(H)  Creatinine 0.61 - 1.24 mg/dL 8.56(H) 7.91(H) 7.98(H)  BUN/Creat Ratio 9 - 20 - - -  Sodium 135 - 145 mmol/L 138 140 140  Potassium 3.5 - 5.1 mmol/L 5.4(H) 5.7(H) 5.0  Chloride 98 - 111 mmol/L 108 113(H) 114(H)  CO2 22 - 32 mmol/L 16(L) 15(L) 14(L)  Calcium 8.9 - 10.3 mg/dL 7.3(L) 6.8(L) 6.8(L)     Assessment/Plan:   # CKD stage V with progression to end-stage disease  -Status post AV fistula creation as above.  With uremic symptoms which he states have been managed better here in the hospital - Consulted vascular to assess if fistula is able to be used.  Has a good bruit and thrill. Have paged - HD today if his access is okay for use.  Vascular thinks likely good to use  for HD - He is NPO currently in the event intervention is needed  - Will need to contact HD SW on 3/1 to be set up with an outpatient HD unit  # Chronic diastolic CHF - Continue other current regimen - Note that cardiology is ultimately planning for ischemia evaluation once on HD  # nausea  - c/w uremia  # Hypertension - controlled   # Hyperkalemia  - temporized  # Metabolic acidosis -Lasix as above and plan for transition to hemodialysis  # Anemia of CKD  -He is on ESA as an outpatient - Epogen 10,000 units -last dose appears to be 2/24.  Not yet due.  feraheme x 1 on 2/27.  # Hyperphosphatemia and secondary hyperparathyroidism - intact PTH pending - Continue binder - Continue calcitriol.  On calcitriol 0.5 mcg daily per office notes   Claudia Desanctis, MD 04/13/2019 8:07 AM

## 2019-04-13 NOTE — Progress Notes (Addendum)
Progress Note  Patient Name: [name] Date of Encounter: 04/13/2019  Primary Cardiologist: Andrew Dawley, MD   Subjective   SOB has improved.  Denies any chest pain.  Plans for HD today if can access fistula  Inpatient Medications    Scheduled Meds: . calcitRIOL  0.5 mcg Oral Daily  . calcium acetate  1,334 mg Oral TID WC  . carvedilol  25 mg Oral BID WC  . Chlorhexidine Gluconate Cloth  6 each Topical Q0600  . feeding supplement (NEPRO CARB STEADY)  237 mL Oral BID BM  . isosorbide-hydrALAZINE  1 tablet Oral TID  . pantoprazole (PROTONIX) IV  40 mg Intravenous Q24H  . sodium bicarbonate  650 mg Oral BID  . sodium chloride flush  3 mL Intravenous Once  . sodium chloride flush  3 mL Intravenous Q12H   Continuous Infusions: . sodium chloride    . ferumoxytol     PRN Meds: sodium chloride, acetaminophen, [DISCONTINUED] alum & mag hydroxide-simeth **AND** lidocaine, ondansetron (ZOFRAN) IV, sodium chloride flush   Vital Signs    Vitals:   04/12/19 1700 04/12/19 2038 04/12/19 2116 04/13/19 0431  BP: (!) 147/108 124/80 136/79 131/74  Pulse: 85 78  73  Resp:  15  17  Temp:  98.1 F (36.7 C)  98 F (36.7 C)  TempSrc:  Oral  Oral  SpO2:  100%  99%  Weight:    107.6 kg  Height:        Intake/Output Summary (Last 24 hours) at 04/13/2019 0818 Last data filed at 04/13/2019 0451 Gross per 24 hour  Intake --  Output 1320 ml  Net -1320 ml   Filed Weights   04/11/19 2159 04/12/19 0500 04/13/19 0431  Weight: 109.4 kg 109.4 kg 107.6 kg    Telemetry    NSR - Personally Reviewed  ECG    No new EKG to review - Personally Reviewed  Physical Exam   GEN: No acute distress.   Neck: No JVD Cardiac: RRR, no murmurs, rubs, or gallops.  Respiratory: Clear to auscultation bilaterally. GI: Soft, nontender, non-distended  MS: 2+ LE edema; No deformity. Neuro:  Nonfocal  Psych: Normal affect   Labs    Chemistry Recent Labs  Lab 04/11/19 1500 04/11/19 1700  04/11/19 2212 04/12/19 0320 04/13/19 0450  NA   < >  --  140 140 138  K   < >  --  5.0 5.7* 5.4*  CL   < >  --  114* 113* 108  CO2   < >  --  14* 15* 16*  GLUCOSE   < >  --  135* 120* 128*  BUN   < >  --  82* 82* 81*  CREATININE   < >  --  7.98* 7.91* 8.56*  CALCIUM   < >  --  6.8* 6.8* 7.3*  PROT  --  6.2*  --   --   --   ALBUMIN  --  3.4*  --  3.0* 3.1*  AST  --  12*  --   --   --   ALT  --  18  --   --   --   ALKPHOS  --  72  --   --   --   BILITOT  --  0.6  --   --   --   GFRNONAA   < >  --  7* 7* 6*  GFRAA   < >  --  8* 8*  7*  ANIONGAP   < >  --  12 12 14    < > = values in this interval not displayed.     Hematology Recent Labs  Lab 04/10/19 1411 04/11/19 1500 04/13/19 0450  WBC  --  6.0 6.2  RBC  --  3.04* 2.90*  HGB 8.6* 8.2* 7.9*  HCT  --  27.4* 25.4*  MCV  --  90.1 87.6  MCH  --  27.0 27.2  MCHC  --  29.9* 31.1  RDW  --  14.9 14.7  PLT  --  231 217    Cardiac EnzymesNo results for input(s): TROPONINI in the last 168 hours. No results for input(s): TROPIPOC in the last 168 hours.   BNP Recent Labs  Lab 04/11/19 1537  BNP 752.1*     DDimer No results for input(s): DDIMER in the last 168 hours.   Radiology    DG Chest 2 View  Result Date: 04/11/2019 CLINICAL DATA:  Chest pain and shortness of breath. EXAM: CHEST - 2 VIEW COMPARISON:  Chest x-ray dated November 26, 2018. FINDINGS: New mild cardiomegaly and pulmonary vascular congestion. New trace bilateral pleural effusions. No consolidation or pneumothorax. No acute osseous abnormality. IMPRESSION: New mild congestive heart failure. Electronically Signed   By: Titus Dubin M.D.   On: 04/11/2019 15:12   ECHOCARDIOGRAM COMPLETE  Result Date: 04/12/2019    ECHOCARDIOGRAM REPORT   Patient Name:   [name] Date of Exam: 04/12/2019 Medical Rec #:  [id number]  Height:       69.0 in Accession #:    [id number] Weight:       241.2 lb Date of Birth:  Mar 14, 1965   BSA:          2.237 m Patient Age:    54 years    BP:           184/98 mmHg Patient Gender: M          HR:           85 bpm. Exam Location:  Inpatient Procedure: 2D Echo and Strain Analysis Indications:    CHF-Acute Diastolic 416.60 / Y30.16  History:        Patient has prior history of Echocardiogram examinations, most                 recent 08/26/2018. Risk Factors:Hypertension, Diabetes and Sleep                 Apnea. Chronic kidney disease.  Sonographer:    Darlina Sicilian RDCS Referring Phys: [id number] [place]  1. Low normal GLS -14.9 EF has decreased since echo done July 2020 . Left ventricular ejection fraction, by estimation, is 35 to 40%. The left ventricle has moderately decreased function. The left ventricle demonstrates global hypokinesis. The left ventricular internal cavity size was moderately dilated. Left ventricular diastolic parameters are consistent with Grade II diastolic dysfunction (pseudonormalization). Elevated left ventricular end-diastolic pressure.  2. Right ventricular systolic function is normal. The right ventricular size is normal. There is moderately elevated pulmonary artery systolic pressure.  3. Left atrial size was moderately dilated.  4. Right atrial size was moderately dilated.  5. The mitral valve is normal in structure and function. Mild to moderate mitral valve regurgitation. No evidence of mitral stenosis.  6. The aortic valve is normal in structure and function. Aortic valve regurgitation is not visualized. No aortic stenosis is present.  7. Aortic dilatation noted. There is mild dilatation of the  aortic root measuring 40 mm.  8. The inferior vena cava is normal in size with greater than 50% respiratory variability, suggesting right atrial pressure of 3 mmHg. FINDINGS  Left Ventricle: Low normal GLS -14.9 EF has decreased since echo done July 2020. Left ventricular ejection fraction, by estimation, is 35 to 40%. The left ventricle has moderately decreased function. The left ventricle demonstrates global  hypokinesis. The left ventricular internal cavity size was moderately dilated. There is no left ventricular hypertrophy. Left ventricular diastolic parameters are consistent with Grade II diastolic dysfunction (pseudonormalization). Elevated left ventricular end-diastolic pressure. Right Ventricle: The right ventricular size is normal. No increase in right ventricular wall thickness. Right ventricular systolic function is normal. There is moderately elevated pulmonary artery systolic pressure. The tricuspid regurgitant velocity is 3.39 m/s, and with an assumed right atrial pressure of 8 mmHg, the estimated right ventricular systolic pressure is 74.1 mmHg. Left Atrium: Left atrial size was moderately dilated. Right Atrium: Right atrial size was moderately dilated. Pericardium: A small pericardial effusion is present. The pericardial effusion is posterior to the left ventricle. Mitral Valve: The mitral valve is normal in structure and function. There is mild thickening of the mitral valve leaflet(s). There is mild calcification of the mitral valve leaflet(s). Normal mobility of the mitral valve leaflets. Mild to moderate mitral  valve regurgitation. No evidence of mitral valve stenosis. Tricuspid Valve: The tricuspid valve is normal in structure. Tricuspid valve regurgitation is mild . No evidence of tricuspid stenosis. Aortic Valve: The aortic valve is normal in structure and function. Aortic valve regurgitation is not visualized. No aortic stenosis is present. Pulmonic Valve: The pulmonic valve was normal in structure. Pulmonic valve regurgitation is trivial. No evidence of pulmonic stenosis. Aorta: The aortic root is normal in size and structure and aortic dilatation noted. There is mild dilatation of the aortic root measuring 40 mm. Venous: The inferior vena cava is normal in size with greater than 50% respiratory variability, suggesting right atrial pressure of 3 mmHg. IAS/Shunts: No atrial level shunt detected  by color flow Doppler.  LEFT VENTRICLE PLAX 2D LVIDd:         5.60 cm      Diastology LVIDs:         4.02 cm      LV e' lateral:   7.62 cm/s LV PW:         1.06 cm      LV E/e' lateral: 16.6 LV IVS:        0.98 cm      LV e' medial:    6.09 cm/s LVOT diam:     2.10 cm      LV E/e' medial:  20.8 LV SV:         77 LV SV Index:   34 LVOT Area:     3.46 cm  LV Volumes (MOD) LV vol d, MOD A2C: 151.5 ml LV vol d, MOD A4C: 187.5 ml LV vol s, MOD A2C: 96.7 ml LV vol s, MOD A4C: 94.0 ml LV SV MOD A2C:     54.8 ml LV SV MOD A4C:     187.5 ml LV SV MOD BP:      79.9 ml RIGHT VENTRICLE RV S prime:     11.90 cm/s TAPSE (M-mode): 2.3 cm LEFT ATRIUM              Index       RIGHT ATRIUM  Index LA diam:        4.60 cm  2.06 cm/m  RA Area:     24.60 cm LA Vol (A2C):   126.0 ml 56.32 ml/m RA Volume:   79.50 ml  35.54 ml/m LA Vol (A4C):   93.6 ml  41.84 ml/m LA Biplane Vol: 110.0 ml 49.17 ml/m  AORTIC VALVE LVOT Vmax:   119.00 cm/s LVOT Vmean:  81.900 cm/s LVOT VTI:    0.221 m  AORTA Ao Root diam: 3.60 cm Ao Asc diam:  4.00 cm MITRAL VALVE                 TRICUSPID VALVE MV Area (PHT): 6.05 cm      TR Peak grad:   46.0 mmHg MV Decel Time: 125 msec      TR Vmax:        339.00 cm/s MR Peak grad:    144.5 mmHg MR Mean grad:    95.0 mmHg   SHUNTS MR Vmax:         601.00 cm/s Systemic VTI:  0.22 m MR Vmean:        460.0 cm/s  Systemic Diam: 2.10 cm MR PISA:         0.25 cm MR PISA Eff ROA: 2 mm MR PISA Radius:  0.20 cm MV E velocity: 126.67 cm/s MV A velocity: 60.20 cm/s MV E/A ratio:  2.10 Jenkins Rouge MD Electronically signed by Jenkins Rouge MD Signature Date/Time: 04/12/2019/9:52:33 AM    Final    US Abdomen Limited RUQ  Result Date: 04/12/2019 CLINICAL DATA:  Abdominal pain EXAM: ULTRASOUND ABDOMEN LIMITED RIGHT UPPER QUADRANT COMPARISON:  CT abdomen pelvis 03/01/2019 FINDINGS: Gallbladder: No gallstones or wall thickening visualized. No sonographic Murphy sign noted by sonographer. Common bile duct: Diameter: 2.9  mm Liver: Mild increased echogenicity liver without focal liver lesion. Portal vein is patent on color Doppler imaging with normal direction of blood flow towards the liver. Other: None. IMPRESSION: Negative for gallstones or biliary dilatation. Mildly echogenic liver. Electronically Signed   By: Franchot Gallo M.D.   On: 04/12/2019 11:32    Cardiac Studies   2D echo 03/2019 IMPRESSIONS    1. Low normal GLS -14.9 EF has decreased since echo done July 2020 . Left  ventricular ejection fraction, by estimation, is 35 to 40%. The left  ventricle has moderately decreased function. The left ventricle  demonstrates global hypokinesis. The left  ventricular internal cavity size was moderately dilated. Left ventricular  diastolic parameters are consistent with Grade II diastolic dysfunction  (pseudonormalization). Elevated left ventricular end-diastolic pressure.  2. Right ventricular systolic function is normal. The right ventricular  size is normal. There is moderately elevated pulmonary artery systolic  pressure.  3. Left atrial size was moderately dilated.  4. Right atrial size was moderately dilated.  5. The mitral valve is normal in structure and function. Mild to moderate  mitral valve regurgitation. No evidence of mitral stenosis.  6. The aortic valve is normal in structure and function. Aortic valve  regurgitation is not visualized. No aortic stenosis is present.  7. Aortic dilatation noted. There is mild dilatation of the aortic root  measuring 40 mm.  8. The inferior vena cava is normal in size with greater than 50%  respiratory variability, suggesting right atrial pressure of 3 mmHg.   Patient Profile     54 y.o. male with a hx of HTN, hyperlipidemia, CKD V pending initiation of HD in March, who is being  seen for the evaluation of acute heart failure exacerbation with newly reduced EF at the request of Dr. Cyndia Skeeters.  Assessment & Plan    1.  Acute combined  systolic/diastolic CHF -this is complicated by ESRD -new LV dysfunction on Echo with EF 35-40% this admit (was 60-65% on echo 08/2018) -given 80mg  IV Lasix yesterday -he put out 1.3L and is net neg 1.5L -weight is down 4lbs from admit -Creatinine up to 8.56 this am and K+ 5.4 -renal seen this am and plans for HD today if can access AVF -Hemodynamically stable -Continue Carvedilol 25mg  BID, Bidil 20-37.5mg  TID -consider addition of ARB down the road if BP remains stable after initiation of HD -discussed with Nephrology and since patient is not having any anginal sx will get on HD first and then plan outpt LHC in near future.  2.  HTN -BP controlled -continue Carvedilol and Bidil  3.  ESRD -? Etiology HTN -per nephrology -planning to access AVF today and start HD  4.  HLD -LDL 97 and TAGs 305 this am -followed by Dr. Debara Pickett -intolerant to Lipitor due to myalgias -was on Crestor 20mg  daily but this seems to have been stopped by the patient -cannot take fenofibrate given ESRD -was tried on Vascepa but had HA's -needs to followup back up with Dr. Debara Pickett as an outpt  I have spent a total of 35 minutes with patient reviewing office notes from lipid clinic, hospital notes from [facility] and Cards, 2D echo , telemetry, EKGs, labs, personally discussing timing of [place] with nephrology and examining patient as well as establishing an assessment and plan that was discussed with the patient.  > 50% of time was spent in direct patient care.       For questions or updates, please contact [place] Please consult [email or web address] for contact info under Cardiology/STEMI.      Signed, Fransico Him, MD  04/13/2019, 8:18 AM

## 2019-04-13 NOTE — Progress Notes (Signed)
PROGRESS NOTE  [name] MBT:[id number] DOB: [birth year]   PCP: Forrest Moron, MD  Patient is from: Home.  Independently ambulates at baseline.  DOA: 04/11/2019 LOS: 2  Brief Narrative / Interim history: 54 year old male with solitary kidney after right nephrectomy for RCC?,  CKD-5 on list for transplant, HTN, anemia of renal disease, PUD and HLD presenting with complaints of shortness of breath, DOE, orthopnea, edema and headache.  Stated that he is on multiple medications which caused him headache. So stopped his medications.   In ED, SBP in 180s.  Saturation normal on RA.  Hgb 8.2 (baseline).  K6.1.  Bicarb 15.  Cr/BUN 7.9/81 (baseline).  HS trop negative x2.  LFT within normal.  Lipase 58.  COVID-19 negative.  BNP 752.  CXR concerning for mild cardiomegaly and pulm vasc congestion with trace bilateral pleural effusions.  Started on IV Lasix and oral labetalol, and admitted for volume overload due to CHF.   The next day, Echo with EF of 35 to 40% (60 to 65% in 08/2018), global hypokinesis, G2 DD, moderate LAE and RAE and aortic dilation. Nephrology and cardiology consulted.  Patient to start dialysis.  Cardiology planning "outpatient LHC in near future".   Subjective: No major events overnight or this morning.  Continues to endorse global headache.  Also reports left ear pain.  Dyspnea and GI symptoms improved.  He denies chest pain, vision change, focal numbness tingling or weakness.  Video interpreter with ID number [id number] used for this encounter.  Objective: Vitals:   04/13/19 1207 04/13/19 1230 04/13/19 1300 04/13/19 1330  BP: (!) 156/86 (!) 170/78 (!) 157/78 (!) 174/87  Pulse: 80 82 84 83  Resp:      Temp:      TempSrc:      SpO2:      Weight:      Height:        Intake/Output Summary (Last 24 hours) at 04/13/2019 1400 Last data filed at 04/13/2019 0451 Gross per 24 hour  Intake --  Output 1320 ml  Net -1320 ml   Filed Weights   04/12/19 0500 04/13/19 0431 04/13/19  1200  Weight: 109.4 kg 107.6 kg 109.9 kg    Examination:  GENERAL: No acute distress.  Appears well.  HEENT: MMM.  PERRL.  EOMI.  No apparent periauricular lesion NECK: Supple.  No apparent JVD.  RESP:  No IWOB. Good air movement bilaterally. CVS:  RRR. Heart sounds normal.  aVF over left arm with good bruits. ABD/GI/GU: Bowel sounds present. Soft. Non tender.  MSK/EXT:  Moves extremities. No apparent deformity. No edema.  SKIN: no apparent skin lesion or wound NEURO: Awake, alert and oriented appropriately.  No apparent focal neuro deficit. PSYCH: Calm. Normal affect.  Procedures:  None  Assessment & Plan: Acute combined CHF: Presents with cardinal symptoms.  Echo with EF of 35 to 40% (60 to 65% in 08/2018), global hypokinesis, G2 DD, moderate LAE and RAE and aortic dilation.  Had about 1.3 L UOP overnight.  -Nephrology to start dialysis for fluid management. -Cardiology following. -GDMT-Coreg and BiDil -Monitor fluid status, renal function and electrolytes -Sodium and fluid restrictions  CKD-5 progressing to nonoliguric ESRD/solitary kidney s/p right nephrectomy for RCC?/Metabolic acidosis/azotemia/hyperkalemia: followed at [facility] for potential transplant -Nephrology-plan to start HD  Abdominal pain/nausea/vomiting: Likely due to uremia. HIDA on 2/19 normal.  RUQ ultrasound without significant finding.  -Continue Protonix, GI cocktail and antiemetics -Manage uremia as above  Anemia of renal disease: Baseline Hgb 8-9>  8.2 (admit)>7.9 .  Iron sats low at 12% on 2/4. -IV iron and ESA per nephrology  Uncontrolled hypertension: Likely due to fluid overload and stopping his medications.  Improved. -Change labetalol to Coreg-in the setting of systolic CHF -BiDil added as above  Tension headache: No red flags.  -As needed Tylenol  Class II obesity: BMI 35.62.  A1c 4.8%.  Triglycerides 305. -Encourage lifestyle change to lose weight.      Nutrition Problem: Inadequate  oral intake Etiology: nausea, vomiting  Signs/Symptoms: per patient/family report  Interventions: Nepro shake   DVT prophylaxis: SCD.  Avoids pork-drived products due to religious affiliation Code Status: Full code Family Communication: Patient and/or RN. Available if any question.  Discharge barrier: Acute CHF in patient solitary kidney and CKD-5 progressing to ESRD Patient is from: Home Final disposition: Likely home when medically stable and cleared by consultants  Consultants: Nephrology, cardiology   Microbiology summarized: COVID-19 negative  Sch Meds:  Scheduled Meds: . calcitRIOL  0.5 mcg Oral Daily  . calcium acetate  1,334 mg Oral TID WC  . carvedilol  25 mg Oral BID WC  . Chlorhexidine Gluconate Cloth  6 each Topical Q0600  . feeding supplement (NEPRO CARB STEADY)  237 mL Oral BID BM  . isosorbide-hydrALAZINE  1 tablet Oral TID  . pantoprazole (PROTONIX) IV  40 mg Intravenous Q24H  . sodium bicarbonate  650 mg Oral BID  . sodium chloride flush  3 mL Intravenous Once  . sodium chloride flush  3 mL Intravenous Q12H   Continuous Infusions: . sodium chloride     PRN Meds:.sodium chloride, acetaminophen, [DISCONTINUED] alum & mag hydroxide-simeth **AND** lidocaine, ondansetron (ZOFRAN) IV, sodium chloride flush  Antimicrobials: Anti-infectives (From admission, onward)   None       I have personally reviewed the following labs and images: CBC: Recent Labs  Lab 04/10/19 1411 04/11/19 1500 04/13/19 0450  WBC  --  6.0 6.2  HGB 8.6* 8.2* 7.9*  HCT  --  27.4* 25.4*  MCV  --  90.1 87.6  PLT  --  231 217   BMP &GFR Recent Labs  Lab 04/11/19 1500 04/11/19 2212 04/12/19 0320 04/13/19 0450  NA 139 140 140 138  K 6.1* 5.0 5.7* 5.4*  CL 111 114* 113* 108  CO2 15* 14* 15* 16*  GLUCOSE 114* 135* 120* 128*  BUN 81* 82* 82* 81*  CREATININE 7.96* 7.98* 7.91* 8.56*  CALCIUM 6.9* 6.8* 6.8* 7.3*  MG  --   --   --  1.6*  PHOS  --   --  6.9* 6.8*    Estimated Creatinine Clearance: 12.1 mL/min (A) (by C-G formula based on SCr of 8.56 mg/dL (H)). Liver & Pancreas: Recent Labs  Lab 04/11/19 1700 04/12/19 0320 04/13/19 0450  AST 12*  --   --   ALT 18  --   --   ALKPHOS 72  --   --   BILITOT 0.6  --   --   PROT 6.2*  --   --   ALBUMIN 3.4* 3.0* 3.1*   Recent Labs  Lab 04/11/19 1700  LIPASE 58*   No results for input(s): AMMONIA in the last 168 hours. Diabetic: Recent Labs    04/13/19 0450  HGBA1C 4.8   No results for input(s): GLUCAP in the last 168 hours. Cardiac Enzymes: No results for input(s): CKTOTAL, CKMB, CKMBINDEX, TROPONINI in the last 168 hours. No results for input(s): PROBNP in the last 8760 hours. Coagulation Profile: No  results for input(s): INR, PROTIME in the last 168 hours. Thyroid Function Tests: No results for input(s): TSH, T4TOTAL, FREET4, T3FREE, THYROIDAB in the last 72 hours. Lipid Profile: Recent Labs    04/13/19 0450  CHOL 179  HDL 21*  LDLCALC 97  TRIG 305*  CHOLHDL 8.5   Anemia Panel: No results for input(s): VITAMINB12, FOLATE, FERRITIN, TIBC, IRON, RETICCTPCT in the last 72 hours. Urine analysis:    Component Value Date/Time   COLORURINE STRAW (A) 08/25/2018 1436   APPEARANCEUR CLEAR 08/25/2018 1436   LABSPEC 1.011 08/25/2018 1436   PHURINE 6.0 08/25/2018 1436   GLUCOSEU NEGATIVE 08/25/2018 1436   HGBUR NEGATIVE 08/25/2018 1436   BILIRUBINUR negative 02/06/2019 1529   BILIRUBINUR neg 03/12/2013 1058   KETONESUR negative 02/06/2019 1529   KETONESUR NEGATIVE 08/25/2018 1436   PROTEINUR >=300 (A) 02/06/2019 1529   PROTEINUR >=300 (A) 08/25/2018 1436   UROBILINOGEN 0.2 02/06/2019 1529   UROBILINOGEN 0.2 07/21/2012 1235   NITRITE Negative 02/06/2019 1529   NITRITE NEGATIVE 08/25/2018 1436   LEUKOCYTESUR Negative 02/06/2019 1529   LEUKOCYTESUR NEGATIVE 08/25/2018 1436   Sepsis Labs: Invalid input(s): PROCALCITONIN, [place]  Microbiology: Recent Results (from the  past 240 hour(s))  SARS CORONAVIRUS 2 (TAT 6-24 HRS) Nasopharyngeal Nasopharyngeal Swab     Status: None   Collection Time: 04/11/19  9:01 PM   Specimen: Nasopharyngeal Swab  Result Value Ref Range Status   SARS Coronavirus 2 NEGATIVE NEGATIVE Final    Comment: (NOTE) SARS-CoV-2 target nucleic acids are NOT DETECTED. The SARS-CoV-2 RNA is generally detectable in upper and lower respiratory specimens during the acute phase of infection. Negative results do not preclude SARS-CoV-2 infection, do not rule out co-infections with other pathogens, and should not be used as the sole basis for treatment or other patient management decisions. Negative results must be combined with clinical observations, patient history, and epidemiological information. The expected result is Negative. Fact Sheet for Patients: SugarRoll.be Fact Sheet for Healthcare Providers: https://www.woods-mathews.com/ This test is not yet approved or cleared by the [place] FDA and  has been authorized for detection and/or diagnosis of SARS-CoV-2 by FDA under an Emergency Use Authorization (EUA). This EUA will remain  in effect (meaning this test can be used) for the duration of the COVID-19 declaration under Section 56 4(b)(1) of the Act, 21 U.S.C. section 360bbb-3(b)(1), unless the authorization is terminated or revoked sooner. Performed at [facility], [place] [address], [place], [place] [address]     Radiology Studies: No results found.   Dot Splinter T. [place]  If 7PM-7AM, please contact night-coverage [email or web address] Password [email or web address] 04/13/2019, 2:00 PM

## 2019-04-14 ENCOUNTER — Inpatient Hospital Stay (HOSPITAL_COMMUNITY): Payer: 59

## 2019-04-14 DIAGNOSIS — E782 Mixed hyperlipidemia: Secondary | ICD-10-CM

## 2019-04-14 DIAGNOSIS — I420 Dilated cardiomyopathy: Secondary | ICD-10-CM

## 2019-04-14 DIAGNOSIS — G44041 Chronic paroxysmal hemicrania, intractable: Secondary | ICD-10-CM

## 2019-04-14 LAB — RENAL FUNCTION PANEL
Albumin: 2.9 g/dL — ABNORMAL LOW (ref 3.5–5.0)
Anion gap: 11 (ref 5–15)
BUN: 55 mg/dL — ABNORMAL HIGH (ref 6–20)
CO2: 22 mmol/L (ref 22–32)
Calcium: 7.4 mg/dL — ABNORMAL LOW (ref 8.9–10.3)
Chloride: 107 mmol/L (ref 98–111)
Creatinine, Ser: 6.72 mg/dL — ABNORMAL HIGH (ref 0.61–1.24)
GFR calc Af Amer: 10 mL/min — ABNORMAL LOW (ref >60)
GFR calc non Af Amer: 8 mL/min — ABNORMAL LOW (ref >60)
Glucose, Bld: 89 mg/dL (ref 70–99)
Phosphorus: 5.7 mg/dL — ABNORMAL HIGH (ref 2.5–4.6)
Potassium: 5.4 mmol/L — ABNORMAL HIGH (ref 3.5–5.1)
Sodium: 140 mmol/L (ref 135–145)

## 2019-04-14 LAB — HEPATITIS B E ANTIGEN: Hep B E Ag: NEGATIVE

## 2019-04-14 LAB — CBC
HCT: 25 % — ABNORMAL LOW (ref 39.0–52.0)
Hemoglobin: 7.7 g/dL — ABNORMAL LOW (ref 13.0–17.0)
MCH: 27.1 pg (ref 26.0–34.0)
MCHC: 30.8 g/dL (ref 30.0–36.0)
MCV: 88 fL (ref 80.0–100.0)
Platelets: 198 10*3/uL (ref 150–400)
RBC: 2.84 MIL/uL — ABNORMAL LOW (ref 4.22–5.81)
RDW: 14.9 % (ref 11.5–15.5)
WBC: 5.5 10*3/uL (ref 4.0–10.5)
nRBC: 0 % (ref 0.0–0.2)

## 2019-04-14 LAB — MAGNESIUM: Magnesium: 1.5 mg/dL — ABNORMAL LOW (ref 1.7–2.4)

## 2019-04-14 LAB — GLUCOSE, CAPILLARY: Glucose-Capillary: 120 mg/dL — ABNORMAL HIGH (ref 70–99)

## 2019-04-14 MED ORDER — PANTOPRAZOLE SODIUM 40 MG PO TBEC
40.0000 mg | DELAYED_RELEASE_TABLET | Freq: Every day | ORAL | Status: DC
  Administered 2019-04-14 – 2019-04-16 (×3): 40 mg via ORAL
  Filled 2019-04-14 (×3): qty 1

## 2019-04-14 MED ORDER — HYDRALAZINE HCL 25 MG PO TABS
25.0000 mg | ORAL_TABLET | Freq: Three times a day (TID) | ORAL | Status: DC
  Administered 2019-04-14 – 2019-04-17 (×11): 25 mg via ORAL
  Filled 2019-04-14 (×10): qty 1

## 2019-04-14 MED ORDER — CHLORHEXIDINE GLUCONATE CLOTH 2 % EX PADS: 6.0000 | MEDICATED_PAD | Freq: Every day | CUTANEOUS | Status: DC

## 2019-04-14 MED ORDER — TRAZODONE HCL 50 MG PO TABS
50.0000 mg | ORAL_TABLET | Freq: Once | ORAL | Status: AC
  Administered 2019-04-14: 50 mg via ORAL
  Filled 2019-04-14: qty 1

## 2019-04-14 MED ORDER — MAGNESIUM SULFATE 2 GM/50ML IV SOLN
2.0000 g | Freq: Once | INTRAVENOUS | Status: AC
  Administered 2019-04-14: 09:00:00 2 g via INTRAVENOUS
  Filled 2019-04-14: qty 50

## 2019-04-14 MED ORDER — FUROSEMIDE 10 MG/ML IJ SOLN
60.0000 mg | Freq: Once | INTRAMUSCULAR | Status: AC
  Administered 2019-04-14: 15:00:00 60 mg via INTRAVENOUS
  Filled 2019-04-14: qty 6

## 2019-04-14 NOTE — Progress Notes (Addendum)
PROGRESS NOTE  [name] BPZ:[id number] DOB: Jan 23, 1966   PCP: Forrest Moron, MD  Patient is from: Home.  Independently ambulates at baseline.  DOA: 04/11/2019 LOS: 3  Brief Narrative / Interim history: 54 year old male with solitary kidney after right nephrectomy for RCC?,  CKD-5 on list for transplant, HTN, anemia of renal disease, PUD and HLD presenting with complaints of shortness of breath, DOE, orthopnea, edema and headache.  Stated that he is on multiple medications which caused him headache. So stopped his medications.   In ED, SBP in 180s.  Saturation normal on RA.  Hgb 8.2 (baseline).  K6.1.  Bicarb 15.  Cr/BUN 7.9/81 (baseline).  HS trop negative x2.  LFT within normal.  Lipase 58.  COVID-19 negative.  BNP 752.  CXR concerning for mild cardiomegaly and pulm vasc congestion with trace bilateral pleural effusions.  Started on IV Lasix and oral labetalol, and admitted for volume overload due to CHF.   The next day, Echo with EF of 35 to 40% (60 to 65% in 08/2018), global hypokinesis, G2 DD, moderate LAE and RAE and aortic dilation. Nephrology and cardiology consulted.  Patient to start dialysis.  Cardiology planning "outpatient LHC in near future".   Assessment & Plan: Acute combined CHF: Presents with cardinal symptoms.  Echo with EF of 35 to 40% (60 to 65% in 08/2018), global hypokinesis, grade 2 diastolic dysfunction, moderate LAE and RAE and aortic dilation.  Nephrology and cardiology on board.  Started on HD on 04/13/2019. Stopped BiDil.  Continue carvedilol. -Monitor fluid status, renal function and electrolytes -Sodium and fluid restrictions  CKD-5 progressing to nonoliguric ESRD/solitary kidney s/p right nephrectomy for RCC?/Metabolic acidosis/azotemia/hyperkalemia: followed at [facility] for potential transplant.  Nephrology on board and he has been started on hemodialysis starting 04/13/2019.  Abdominal pain/nausea/vomiting: Likely due to uremia. HIDA on 2/19 normal.  RUQ  ultrasound without significant finding.  -Continue Protonix, GI cocktail and antiemetics -Manage uremia as above.  He has no more symptoms.  Anemia of renal disease: Baseline Hgb 8-9> 8.2 (admit)>7.9 .  Iron sats low at 12% on 2/4. -IV iron and ESA per nephrology  Uncontrolled hypertension: Likely due to fluid overload and stopping his medications.  Improved. -Change labetalol to Coreg-in the setting of systolic CHF Stopped BiDil by cardiology today.  He is on hydralazine 25 mg 3 times daily.  Tension headache: No red flags but he is requesting to check CT head.  We will proceed with that.  Continue Tylenol.  Class II obesity: BMI 35.62.  A1c 4.8%.  Triglycerides 305. -Encourage lifestyle change to lose weight.   Nutrition Problem: Inadequate oral intake Etiology: nausea, vomiting  Signs/Symptoms: per patient/family report  Interventions: Nepro shake   DVT prophylaxis: SCD.  Avoids pork-drived products due to religious affiliation Code Status: Full code Family Communication: No family present.  Discussed in length with patient.  Discharge barrier: Acute CHF in patient solitary kidney and CKD-5 now on HD. Patient is from: Home Final disposition: Likely home when medically stable and cleared by consultants  Consultants: Nephrology, cardiology    Subjective: Patient seen and examined.  He continues to complain of headache.  Denied any shortness of breath or any other complaint.  Objective: Vitals:   04/13/19 1701 04/13/19 1934 04/14/19 0815 04/14/19 1200  BP: (!) 167/86 128/65 (!) 152/96 (!) 151/84  Pulse: 85 80 85 87  Resp:  18 18 18   Temp:  98.1 F (36.7 C) 98.1 F (36.7 C)   TempSrc:  Oral  Oral   SpO2:  96% 99% 97%  Weight:   105.7 kg   Height:        Intake/Output Summary (Last 24 hours) at 04/14/2019 1413 Last data filed at 04/14/2019 0825 Gross per 24 hour  Intake 490 ml  Output 500 ml  Net -10 ml   Filed Weights   04/13/19 1200 04/13/19 1408 04/14/19  0815  Weight: 109.9 kg 111 kg 105.7 kg    Examination:  General exam: Appears calm and comfortable, obese Respiratory system: Clear to auscultation. Respiratory effort normal. Cardiovascular system: S1 & S2 heard, RRR. No JVD, murmurs, rubs, gallops or clicks. No pedal edema. Gastrointestinal system: Abdomen is nondistended, soft and nontender. No organomegaly or masses felt. Normal bowel sounds heard. Central nervous system: Alert and oriented. No focal neurological deficits. Extremities: Symmetric 5 x 5 power. Skin: No rashes, lesions or ulcers.   Procedures:  None   Microbiology summarized: [id number] negative  Sch Meds:  Scheduled Meds: . calcitRIOL  0.5 mcg Oral Daily  . calcium acetate  1,334 mg Oral TID WC  . carvedilol  25 mg Oral BID WC  . feeding supplement (NEPRO CARB STEADY)  237 mL Oral BID BM  . furosemide  60 mg Intravenous Once  . hydrALAZINE  25 mg Oral Q8H  . pantoprazole  40 mg Oral QHS  . sodium chloride flush  3 mL Intravenous Once  . sodium chloride flush  3 mL Intravenous Q12H   Continuous Infusions: . sodium chloride     PRN Meds:.sodium chloride, acetaminophen, [DISCONTINUED] alum & mag hydroxide-simeth **AND** lidocaine, ondansetron (ZOFRAN) IV, sodium chloride flush  Antimicrobials: Anti-infectives (From admission, onward)   None       I have personally reviewed the following labs and images: CBC: Recent Labs  Lab 04/10/19 1411 04/11/19 1500 04/13/19 0450 04/14/19 0220  WBC  --  6.0 6.2 5.5  HGB 8.6* 8.2* 7.9* 7.7*  HCT  --  27.4* 25.4* 25.0*  MCV  --  90.1 87.6 88.0  PLT  --  231 217 198   BMP &GFR Recent Labs  Lab 04/11/19 1500 04/11/19 2212 04/12/19 0320 04/13/19 0450 04/14/19 0220  NA 139 140 140 138 140  K 6.1* 5.0 5.7* 5.4* 5.4*  CL 111 114* 113* 108 107  CO2 15* 14* 15* 16* 22  GLUCOSE 114* 135* 120* 128* 89  BUN 81* 82* 82* 81* 55*  CREATININE 7.96* 7.98* 7.91* 8.56* 6.72*  CALCIUM 6.9* 6.8* 6.8* 7.3* 7.4*  MG   --   --   --  1.6* 1.5*  PHOS  --   --  6.9* 6.8* 5.7*   Estimated Creatinine Clearance: 15.1 mL/min (A) (by C-G formula based on SCr of 6.72 mg/dL (H)). Liver & Pancreas: Recent Labs  Lab 04/11/19 1700 04/12/19 0320 04/13/19 0450 04/14/19 0220  AST 12*  --   --   --   ALT 18  --   --   --   ALKPHOS 72  --   --   --   BILITOT 0.6  --   --   --   PROT 6.2*  --   --   --   ALBUMIN 3.4* 3.0* 3.1* 2.9*   Recent Labs  Lab 04/11/19 1700  LIPASE 58*   No results for input(s): AMMONIA in the last 168 hours. Diabetic: Recent Labs    04/13/19 0450  HGBA1C 4.8   Recent Labs  Lab 04/13/19 2221  GLUCAP 87   Cardiac  Enzymes: No results for input(s): CKTOTAL, CKMB, CKMBINDEX, TROPONINI in the last 168 hours. No results for input(s): PROBNP in the last 8760 hours. Coagulation Profile: No results for input(s): INR, PROTIME in the last 168 hours. Thyroid Function Tests: No results for input(s): TSH, T4TOTAL, FREET4, T3FREE, THYROIDAB in the last 72 hours. Lipid Profile: Recent Labs    04/13/19 0450  CHOL 179  HDL 21*  LDLCALC 97  TRIG 305*  CHOLHDL 8.5   Anemia Panel: No results for input(s): VITAMINB12, FOLATE, FERRITIN, TIBC, IRON, RETICCTPCT in the last 72 hours. Urine analysis:    Component Value Date/Time   COLORURINE STRAW (A) 08/25/2018 1436   APPEARANCEUR CLEAR 08/25/2018 1436   LABSPEC 1.011 08/25/2018 1436   PHURINE 6.0 08/25/2018 1436   GLUCOSEU NEGATIVE 08/25/2018 1436   HGBUR NEGATIVE 08/25/2018 1436   BILIRUBINUR negative 02/06/2019 1529   BILIRUBINUR neg 03/12/2013 1058   KETONESUR negative 02/06/2019 1529   KETONESUR NEGATIVE 08/25/2018 1436   PROTEINUR >=300 (A) 02/06/2019 1529   PROTEINUR >=300 (A) 08/25/2018 1436   UROBILINOGEN 0.2 02/06/2019 1529   UROBILINOGEN 0.2 07/21/2012 1235   NITRITE Negative 02/06/2019 1529   NITRITE NEGATIVE 08/25/2018 1436   LEUKOCYTESUR Negative 02/06/2019 1529   LEUKOCYTESUR NEGATIVE 08/25/2018 1436   Sepsis  Labs: Invalid input(s): PROCALCITONIN, [place]  Microbiology: Recent Results (from the past 240 hour(s))  SARS CORONAVIRUS 2 (TAT 6-24 HRS) Nasopharyngeal Nasopharyngeal Swab     Status: None   Collection Time: 04/11/19  9:01 PM   Specimen: Nasopharyngeal Swab  Result Value Ref Range Status   SARS Coronavirus 2 NEGATIVE NEGATIVE Final    Comment: (NOTE) SARS-CoV-2 target nucleic acids are NOT DETECTED. The SARS-CoV-2 RNA is generally detectable in upper and lower respiratory specimens during the acute phase of infection. Negative results do not preclude SARS-CoV-2 infection, do not rule out co-infections with other pathogens, and should not be used as the sole basis for treatment or other patient management decisions. Negative results must be combined with clinical observations, patient history, and epidemiological information. The expected result is Negative. Fact Sheet for Patients: SugarRoll.be Fact Sheet for Healthcare Providers: https://www.woods-mathews.com/ This test is not yet approved or cleared by the [place] FDA and  has been authorized for detection and/or diagnosis of SARS-CoV-2 by FDA under an Emergency Use Authorization (EUA). This EUA will remain  in effect (meaning this test can be used) for the duration of the COVID-19 declaration under Section 56 4(b)(1) of the Act, 21 U.S.C. section 360bbb-3(b)(1), unless the authorization is terminated or revoked sooner. Performed at [facility], [place] [address] [place], [place] [address]     Radiology Studies: No results found.  Darliss Cheney, MD [facility]  If 7PM-7AM, please contact night-coverage [email or web address] 04/14/2019, 2:13 PM

## 2019-04-14 NOTE — Progress Notes (Signed)
[facility] Progress Note  Name: [name] MRN: [id number] DOB: 06/09/1965  Chief Complaint:  Nausea and shortness of breath   Subjective:    He had 0.5 L UOP over 2/27 charted.  Had first HD tx with no UF on 2/27.  We discussed plans for HD again tomorrow.  Would rather have lasix than kayexalate.   Review of systems:  Denies nausea  Denies chest pain or shortness of breath  Reports had a headache - better with PRN.   ---------- Background on consult:  [name] is a 54 y.o. male with a history including advanced CKD, chronic diastolic CHF, and hypertension who presented to the hospital with one week of increased dyspnea on exertion.  Spoke with the patient via an Arabic interpreter; the interpreter was accessed through the computer at his bedside.  He has also had issues with nausea and dry heaving.  He has not been able to take his medicines for about a week he states creatinine is 7.91 on consult and BUN is 82 and K 5.7.  Nephrology is consulted for assistance with management.  He follows with [name] at [facility].  He has been on Epogen 10,000 unit dose every two weeks. He underwent left first stage basilic vein transposition on 01/04/19 and had left second stage basilic vein transposition with Dr. Carlis Abbott on 03/11/19.  Note Cr 2/3 was 7.16 with BUN 67 and K 5.9, bicarb 15, PTH 444.  He was started on daily lokelma and took several doses and stopped about a week ago given his nausea. Had 500 mL UOP charted thus far today.  Received lasix 40 mg IV last night x 1 dose.  Has been on room air.  He states that his breathing has gotten better since he has been here.  Note that cardiology is ultimately planning for ischemia evaluation.  He has had trouble with headaches for years and he associates these with medications - not sure which.  He's frustrated they haven't been able to figure out the cause.  We discussed the risks and benefits and indications of hemodialysis and he is  willing to start dialysis if his fistula is ready.  If not he is willing to do whatever is recommended.   Intake/Output Summary (Last 24 hours) at 04/14/2019 1245 Last data filed at 04/14/2019 0825 Gross per 24 hour  Intake 490 ml  Output 500 ml  Net -10 ml    Vitals:  Vitals:   04/13/19 1408 04/13/19 1701 04/13/19 1934 04/14/19 0815  BP: (!) 170/87 (!) 167/86 128/65 (!) 152/96  Pulse: 80 85 80 85  Resp: 20  18 18   Temp: 98 F (36.7 C)  98.1 F (36.7 C) 98.1 F (36.7 C)  TempSrc: Oral  Oral Oral  SpO2: 99%  96% 99%  Weight: 111 kg   105.7 kg  Height:         Physical Exam:  General adult male in bed in no acute distress HEENT normocephalic atraumatic extraocular movements intact sclera anicteric Neck supple trachea midline Lungs clear to auscultation bilaterally normal work of breathing at rest  Heart regular rate and rhythm no rubs or gallops appreciated Abdomen softly dist obese habitus  Extremities trace edema; no cyanosis or clubbing Psych normal mood and affect Neuro - alert and oriented x 3 follows commands and provides hx  Access LUE AVF bruit and thrill    Medications reviewed   Labs:  BMP Latest Ref Rng & Units 04/14/2019 04/13/2019 04/12/2019  Glucose 70 - 99 mg/dL 89 128(H) 120(H)  BUN 6 - 20 mg/dL 55(H) 81(H) 82(H)  Creatinine 0.61 - 1.24 mg/dL 6.72(H) 8.56(H) 7.91(H)  BUN/Creat Ratio 9 - 20 - - -  Sodium 135 - 145 mmol/L 140 138 140  Potassium 3.5 - 5.1 mmol/L 5.4(H) 5.4(H) 5.7(H)  Chloride 98 - 111 mmol/L 107 108 113(H)  CO2 22 - 32 mmol/L 22 16(L) 15(L)  Calcium 8.9 - 10.3 mg/dL 7.4(L) 7.3(L) 6.8(L)     Assessment/Plan:   # CKD stage V with progression to end-stage disease  -Status post AV fistula creation as above.  With uremic symptoms which he states have been managed better here in the hospital. First HD on 2/27 using his existing L AVF.  - Will need to contact HD SW on 3/1 to be set up with an outpatient HD unit - Lasix once now - Next HD  on 3/1 (planned given his K)  # Chronic diastolic CHF - lasix once now, 2/28 - Continue other current regimen - Note that cardiology is ultimately planning for ischemia evaluation once on HD  # nausea  - c/w uremia  # Hypertension - lasix once now - cont other regimen  # Hyperkalemia  - temporized  # Metabolic acidosis - transitioned to HD. Discontinued sodium bicarb  # Anemia of CKD  -He is on ESA as an outpatient - Epogen 10,000 units -last dose appears to be 2/24.  Not yet due.  feraheme x 1 on 2/27.  # Hyperphosphatemia and secondary hyperparathyroidism - intact PTH pending   - Continue binder - Continue calcitriol.  On calcitriol 0.5 mcg daily per office notes   Claudia Desanctis, MD 04/14/2019 1:00 PM

## 2019-04-14 NOTE — Progress Notes (Addendum)
Progress Note  Patient Name: [name] Date of Encounter: 04/14/2019  Primary Cardiologist: Ena Dawley, MD   Subjective   Started HD yesterday.   Denies any SOB or CP. He has chronic HAs but says since coming in hospital and meds changed HAs are worse  Inpatient Medications    Scheduled Meds:  calcitRIOL  0.5 mcg Oral Daily   calcium acetate  1,334 mg Oral TID WC   carvedilol  25 mg Oral BID WC   feeding supplement (NEPRO CARB STEADY)  237 mL Oral BID BM   isosorbide-hydrALAZINE  1 tablet Oral TID   pantoprazole (PROTONIX) IV  40 mg Intravenous Q24H   sodium bicarbonate  650 mg Oral BID   sodium chloride flush  3 mL Intravenous Once   sodium chloride flush  3 mL Intravenous Q12H   Continuous Infusions:  sodium chloride     PRN Meds: sodium chloride, acetaminophen, [DISCONTINUED] alum & mag hydroxide-simeth **AND** lidocaine, ondansetron (ZOFRAN) IV, sodium chloride flush   Vital Signs    Vitals:   04/13/19 1400 04/13/19 1408 04/13/19 1701 04/13/19 1934  BP: (!) 177/93 (!) 170/87 (!) 167/86 128/65  Pulse: 82 80 85 80  Resp:  20  18  Temp:  98 F (36.7 C)  98.1 F (36.7 C)  TempSrc:  Oral  Oral  SpO2:  99%  96%  Weight:  111 kg    Height:        Intake/Output Summary (Last 24 hours) at 04/14/2019 0801 Last data filed at 04/14/2019 0600 Gross per 24 hour  Intake 240 ml  Output 500 ml  Net -260 ml   Filed Weights   04/13/19 0431 04/13/19 1200 04/13/19 1408  Weight: 107.6 kg 109.9 kg 111 kg    Telemetry    NSR - Personally Reviewed  ECG    No new EKG to review - Personally Reviewed  Physical Exam   GEN: Well nourished, well developed in no acute distress HEENT: Normal NECK: No JVD; No carotid bruits LYMPHATICS: No lymphadenopathy CARDIAC:RRR, no murmurs, rubs, gallops RESPIRATORY:  Clear to auscultation without rales, wheezing or rhonchi  ABDOMEN: Soft, non-tender, non-distended MUSCULOSKELETAL:  No edema; No deformity  SKIN:  Warm and dry NEUROLOGIC:  Alert and oriented x 3 PSYCHIATRIC:  Normal affect    Labs    Chemistry Recent Labs  Lab 04/11/19 1700 04/11/19 2212 04/12/19 0320 04/13/19 0450 04/14/19 0220  NA  --    < > 140 138 140  K  --    < > 5.7* 5.4* 5.4*  CL  --    < > 113* 108 107  CO2  --    < > 15* 16* 22  GLUCOSE  --    < > 120* 128* 89  BUN  --    < > 82* 81* 55*  CREATININE  --    < > 7.91* 8.56* 6.72*  CALCIUM  --    < > 6.8* 7.3* 7.4*  PROT 6.2*  --   --   --   --   ALBUMIN 3.4*  --  3.0* 3.1* 2.9*  AST 12*  --   --   --   --   ALT 18  --   --   --   --   ALKPHOS 72  --   --   --   --   BILITOT 0.6  --   --   --   --   GFRNONAA  --    < >  7* 6* 8*  GFRAA  --    < > 8* 7* 10*  ANIONGAP  --    < > 12 14 11    < > = values in this interval not displayed.     Hematology Recent Labs  Lab 04/11/19 1500 04/13/19 0450 04/14/19 0220  WBC 6.0 6.2 5.5  RBC 3.04* 2.90* 2.84*  HGB 8.2* 7.9* 7.7*  HCT 27.4* 25.4* 25.0*  MCV 90.1 87.6 88.0  MCH 27.0 27.2 27.1  MCHC 29.9* 31.1 30.8  RDW 14.9 14.7 14.9  PLT 231 217 198    Cardiac EnzymesNo results for input(s): TROPONINI in the last 168 hours. No results for input(s): TROPIPOC in the last 168 hours.   BNP Recent Labs  Lab 04/11/19 1537  BNP 752.1*     DDimer No results for input(s): DDIMER in the last 168 hours.   Radiology    ECHOCARDIOGRAM COMPLETE  Result Date: 04/12/2019    ECHOCARDIOGRAM REPORT   Patient Name:   VINAY ERTL Date of Exam: 04/12/2019 Medical Rec #:  [id number]  Height:       69.0 in Accession #:    [id number] Weight:       241.2 lb Date of Birth:  1966/01/30   BSA:          2.237 m Patient Age:    54 years   BP:           184/98 mmHg Patient Gender: M          HR:           85 bpm. Exam Location:  Inpatient Procedure: 2D Echo and Strain Analysis Indications:    CHF-Acute Diastolic 921.19 / E17.40  History:        Patient has prior history of Echocardiogram examinations, most                 recent 08/26/2018.  Risk Factors:Hypertension, Diabetes and Sleep                 Apnea. Chronic kidney disease.  Sonographer:    Darlina Sicilian RDCS Referring Phys: [id number] [place]  1. Low normal GLS -14.9 EF has decreased since echo done July 2020 . Left ventricular ejection fraction, by estimation, is 35 to 40%. The left ventricle has moderately decreased function. The left ventricle demonstrates global hypokinesis. The left ventricular internal cavity size was moderately dilated. Left ventricular diastolic parameters are consistent with Grade II diastolic dysfunction (pseudonormalization). Elevated left ventricular end-diastolic pressure.  2. Right ventricular systolic function is normal. The right ventricular size is normal. There is moderately elevated pulmonary artery systolic pressure.  3. Left atrial size was moderately dilated.  4. Right atrial size was moderately dilated.  5. The mitral valve is normal in structure and function. Mild to moderate mitral valve regurgitation. No evidence of mitral stenosis.  6. The aortic valve is normal in structure and function. Aortic valve regurgitation is not visualized. No aortic stenosis is present.  7. Aortic dilatation noted. There is mild dilatation of the aortic root measuring 40 mm.  8. The inferior vena cava is normal in size with greater than 50% respiratory variability, suggesting right atrial pressure of 3 mmHg. FINDINGS  Left Ventricle: Low normal GLS -14.9 EF has decreased since echo done July 2020. Left ventricular ejection fraction, by estimation, is 35 to 40%. The left ventricle has moderately decreased function. The left ventricle demonstrates global hypokinesis. The left ventricular internal cavity size was moderately  dilated. There is no left ventricular hypertrophy. Left ventricular diastolic parameters are consistent with Grade II diastolic dysfunction (pseudonormalization). Elevated left ventricular end-diastolic pressure. Right Ventricle: The  right ventricular size is normal. No increase in right ventricular wall thickness. Right ventricular systolic function is normal. There is moderately elevated pulmonary artery systolic pressure. The tricuspid regurgitant velocity is 3.39 m/s, and with an assumed right atrial pressure of 8 mmHg, the estimated right ventricular systolic pressure is 07.3 mmHg. Left Atrium: Left atrial size was moderately dilated. Right Atrium: Right atrial size was moderately dilated. Pericardium: A small pericardial effusion is present. The pericardial effusion is posterior to the left ventricle. Mitral Valve: The mitral valve is normal in structure and function. There is mild thickening of the mitral valve leaflet(s). There is mild calcification of the mitral valve leaflet(s). Normal mobility of the mitral valve leaflets. Mild to moderate mitral  valve regurgitation. No evidence of mitral valve stenosis. Tricuspid Valve: The tricuspid valve is normal in structure. Tricuspid valve regurgitation is mild . No evidence of tricuspid stenosis. Aortic Valve: The aortic valve is normal in structure and function. Aortic valve regurgitation is not visualized. No aortic stenosis is present. Pulmonic Valve: The pulmonic valve was normal in structure. Pulmonic valve regurgitation is trivial. No evidence of pulmonic stenosis. Aorta: The aortic root is normal in size and structure and aortic dilatation noted. There is mild dilatation of the aortic root measuring 40 mm. Venous: The inferior vena cava is normal in size with greater than 50% respiratory variability, suggesting right atrial pressure of 3 mmHg. IAS/Shunts: No atrial level shunt detected by color flow Doppler.  LEFT VENTRICLE PLAX 2D LVIDd:         5.60 cm      Diastology LVIDs:         4.02 cm      LV e' lateral:   7.62 cm/s LV PW:         1.06 cm      LV E/e' lateral: 16.6 LV IVS:        0.98 cm      LV e' medial:    6.09 cm/s LVOT diam:     2.10 cm      LV E/e' medial:  20.8 LV SV:          77 LV SV Index:   34 LVOT Area:     3.46 cm  LV Volumes (MOD) LV vol d, MOD A2C: 151.5 ml LV vol d, MOD A4C: 187.5 ml LV vol s, MOD A2C: 96.7 ml LV vol s, MOD A4C: 94.0 ml LV SV MOD A2C:     54.8 ml LV SV MOD A4C:     187.5 ml LV SV MOD BP:      79.9 ml RIGHT VENTRICLE RV S prime:     11.90 cm/s TAPSE (M-mode): 2.3 cm LEFT ATRIUM              Index       RIGHT ATRIUM           Index LA diam:        4.60 cm  2.06 cm/m  RA Area:     24.60 cm LA Vol (A2C):   126.0 ml 56.32 ml/m RA Volume:   79.50 ml  35.54 ml/m LA Vol (A4C):   93.6 ml  41.84 ml/m LA Biplane Vol: 110.0 ml 49.17 ml/m  AORTIC VALVE LVOT Vmax:   119.00 cm/s LVOT Vmean:  81.900 cm/s LVOT VTI:  0.221 m  AORTA Ao Root diam: 3.60 cm Ao Asc diam:  4.00 cm MITRAL VALVE                 TRICUSPID VALVE MV Area (PHT): 6.05 cm      TR Peak grad:   46.0 mmHg MV Decel Time: 125 msec      TR Vmax:        339.00 cm/s MR Peak grad:    144.5 mmHg MR Mean grad:    95.0 mmHg   SHUNTS MR Vmax:         601.00 cm/s Systemic VTI:  0.22 m MR Vmean:        460.0 cm/s  Systemic Diam: 2.10 cm MR PISA:         0.25 cm MR PISA Eff ROA: 2 mm MR PISA Radius:  0.20 cm MV E velocity: 126.67 cm/s MV A velocity: 60.20 cm/s MV E/A ratio:  2.10 Jenkins Rouge MD Electronically signed by Jenkins Rouge MD Signature Date/Time: 04/12/2019/9:52:33 AM    Final    US Abdomen Limited RUQ  Result Date: 04/12/2019 CLINICAL DATA:  Abdominal pain EXAM: ULTRASOUND ABDOMEN LIMITED RIGHT UPPER QUADRANT COMPARISON:  CT abdomen pelvis 03/01/2019 FINDINGS: Gallbladder: No gallstones or wall thickening visualized. No sonographic Murphy sign noted by sonographer. Common bile duct: Diameter: 2.9 mm Liver: Mild increased echogenicity liver without focal liver lesion. Portal vein is patent on color Doppler imaging with normal direction of blood flow towards the liver. Other: None. IMPRESSION: Negative for gallstones or biliary dilatation. Mildly echogenic liver. Electronically Signed   By: Franchot Gallo M.D.   On: 04/12/2019 11:32    Cardiac Studies   2D echo 03/2019 IMPRESSIONS    1. Low normal GLS -14.9 EF has decreased since echo done July 2020 . Left  ventricular ejection fraction, by estimation, is 35 to 40%. The left  ventricle has moderately decreased function. The left ventricle  demonstrates global hypokinesis. The left  ventricular internal cavity size was moderately dilated. Left ventricular  diastolic parameters are consistent with Grade II diastolic dysfunction  (pseudonormalization). Elevated left ventricular end-diastolic pressure.  2. Right ventricular systolic function is normal. The right ventricular  size is normal. There is moderately elevated pulmonary artery systolic  pressure.  3. Left atrial size was moderately dilated.  4. Right atrial size was moderately dilated.  5. The mitral valve is normal in structure and function. Mild to moderate  mitral valve regurgitation. No evidence of mitral stenosis.  6. The aortic valve is normal in structure and function. Aortic valve  regurgitation is not visualized. No aortic stenosis is present.  7. Aortic dilatation noted. There is mild dilatation of the aortic root  measuring 40 mm.  8. The inferior vena cava is normal in size with greater than 50%  respiratory variability, suggesting right atrial pressure of 3 mmHg.   Patient Profile     54 y.o. male with a hx of HTN, hyperlipidemia, CKD V pending initiation of HD in March, who is being seen for the evaluation of acute heart failure exacerbation with newly reduced EF at the request of Dr. Cyndia Skeeters.  Assessment & Plan    1.  Acute combined systolic/diastolic CHF -this is complicated by ESRD -new LV dysfunction on Echo with EF 35-40% this admit (was 60-65% on echo 08/2018) -weight is up 3lbs from admit -Creatinine down to 6.72 this am from 8.56 -starting HD -Hemodynamically stable -Continue Carvedilol 25mg  BIID -he has chronic HAs  but they have gotten  worse since being in hospital ? Addition off Bidil is contributing -stop Bidil and continue on Hydralazine 25mg  TID.  -consider addition of ARB down the road if BP remains stable after initiation of HD -discussed with Nephrology and since patient is not having any anginal sx will get on HD first and then plan outpt [facility] in near future.  2.  HTN -BP much improved after changing Labetolol to Carvedilol -continue Carvedilol  -stop Bidil due to worsening of chronic HAs -start Hydralazine 25mg  TID  3.  ESRD -? Etiology HTN -per nephrology -starting HD per neprhology  4.  HLD -LDL 97 and TAGs 305 this am -followed by Dr. Debara Pickett -intolerant to Lipitor due to myalgias -was on Crestor 20mg  daily but this seems to have been stopped by the patient -cannot take fenofibrate given ESRD -was tried on Vascepa but had HA's -needs to followup back up with Dr. Debara Pickett as an outpt  5.  Severe HAs -? Etiology -he is worried he has a brain tumor or aneurysm -HAs worse since admission and may be due to addition of Bidil with nitrate component -stopping Bidil and replacing with Hydralazine for BP control -consider head CT per [facility]  I have spent a total of 30 minutes with patient reviewing hospital notes from [facility] , telemetry, EKGs, labs and examining patient as well as establishing an assessment and plan that was discussed with the patient.  > 50% of time was spent in direct patient care.    For questions or updates, please contact [place] Please consult [email or web address] for contact info under Cardiology/STEMI.      Signed, Fransico Him, MD  04/14/2019, 8:01 AM

## 2019-04-14 NOTE — Progress Notes (Signed)
PHARMACIST - PHYSICIAN COMMUNICATION DR:   Doristine Bosworth CONCERNING: Protonix IV to Oral Route Change Policy  RECOMMENDATION: This patient is receiving Protonix by the intravenous route.  Based on criteria approved by the Pharmacy and Therapeutics Committee, this drug is being converted to the equivalent oral dose form(s).  DESCRIPTION: These criteria include:  The patient is eating (either orally or via tube) and/or has been taking other orally administered medications for a least 24 hours  There is no active GI bleed or impaired GI absorption noted.   If you have questions about this conversion, please contact the Pharmacy Department  []   [phone] )  [name] [x]   [phone] )  [name]  []   [phone] )  [facility] []   [phone] )  [name], PharmD, BCPS  12:49 PM

## 2019-04-15 ENCOUNTER — Ambulatory Visit: Payer: BLUE CROSS/BLUE SHIELD | Admitting: Internal Medicine

## 2019-04-15 DIAGNOSIS — I5023 Acute on chronic systolic (congestive) heart failure: Secondary | ICD-10-CM

## 2019-04-15 LAB — CBC
HCT: 26.9 % — ABNORMAL LOW (ref 39.0–52.0)
Hemoglobin: 8.2 g/dL — ABNORMAL LOW (ref 13.0–17.0)
MCH: 27.1 pg (ref 26.0–34.0)
MCHC: 30.5 g/dL (ref 30.0–36.0)
MCV: 88.8 fL (ref 80.0–100.0)
Platelets: 212 10*3/uL (ref 150–400)
RBC: 3.03 MIL/uL — ABNORMAL LOW (ref 4.22–5.81)
RDW: 15 % (ref 11.5–15.5)
WBC: 5.8 10*3/uL (ref 4.0–10.5)
nRBC: 0 % (ref 0.0–0.2)

## 2019-04-15 LAB — RENAL FUNCTION PANEL
Albumin: 3.1 g/dL — ABNORMAL LOW (ref 3.5–5.0)
Anion gap: 13 (ref 5–15)
BUN: 60 mg/dL — ABNORMAL HIGH (ref 6–20)
CO2: 22 mmol/L (ref 22–32)
Calcium: 7.8 mg/dL — ABNORMAL LOW (ref 8.9–10.3)
Chloride: 104 mmol/L (ref 98–111)
Creatinine, Ser: 7.42 mg/dL — ABNORMAL HIGH (ref 0.61–1.24)
GFR calc Af Amer: 9 mL/min — ABNORMAL LOW (ref >60)
GFR calc non Af Amer: 8 mL/min — ABNORMAL LOW (ref >60)
Glucose, Bld: 92 mg/dL (ref 70–99)
Phosphorus: 5.8 mg/dL — ABNORMAL HIGH (ref 2.5–4.6)
Potassium: 4.9 mmol/L (ref 3.5–5.1)
Sodium: 139 mmol/L (ref 135–145)

## 2019-04-15 LAB — HEPATITIS B E ANTIBODY: Hep B E Ab: NEGATIVE

## 2019-04-15 LAB — GLUCOSE, CAPILLARY
Glucose-Capillary: 85 mg/dL (ref 70–99)
Glucose-Capillary: 128 mg/dL — ABNORMAL HIGH (ref 70–99)

## 2019-04-15 LAB — MAGNESIUM: Magnesium: 2 mg/dL (ref 1.7–2.4)

## 2019-04-15 MED ORDER — CALCITRIOL 0.5 MCG PO CAPS
ORAL_CAPSULE | ORAL | Status: AC
  Administered 2019-04-15: 0.5 ug via ORAL
  Filled 2019-04-15: qty 1

## 2019-04-15 NOTE — Progress Notes (Signed)
PROGRESS NOTE  [name] VXB:[id number] DOB: 1965/12/30   PCP: Forrest Moron, MD  Patient is from: Home.  Independently ambulates at baseline.  DOA: 04/11/2019 LOS: 4  Brief Narrative / Interim history: 54 year old male with solitary kidney after right nephrectomy for RCC?,  CKD-5 on list for transplant, HTN, anemia of renal disease, PUD and HLD presenting with complaints of shortness of breath, DOE, orthopnea, edema and headache.  Stated that he is on multiple medications which caused him headache. So stopped his medications.   In ED, SBP in 180s.  Saturation normal on RA.  Hgb 8.2 (baseline).  K6.1.  Bicarb 15.  Cr/BUN 7.9/81 (baseline).  HS trop negative x2.  LFT within normal.  Lipase 58.  COVID-19 negative.  BNP 752.  CXR concerning for mild cardiomegaly and pulm vasc congestion with trace bilateral pleural effusions.  Started on IV Lasix and oral labetalol, and admitted for volume overload due to CHF.   The next day, Echo with EF of 35 to 40% (60 to 65% in 08/2018), global hypokinesis, G2 DD, moderate LAE and RAE and aortic dilation. Nephrology and cardiology consulted.  Patient to start dialysis.  Cardiology planning "outpatient LHC in near future".   Assessment & Plan: Acute combined CHF: Presents with cardinal symptoms.  Echo with EF of 35 to 40% (60 to 65% in 08/2018), global hypokinesis, grade 2 diastolic dysfunction, moderate LAE and RAE and aortic dilation.  Nephrology and cardiology on board.  Started on HD on 04/13/2019. Stopped BiDil.  Continue carvedilol. -Monitor fluid status, renal function and electrolytes -Sodium and fluid restrictions.  He has been started on dialysis so that is managing his CHF.  Cardiology signed off.  CKD-5 progressing to nonoliguric ESRD/solitary kidney s/p right nephrectomy for RCC?/Metabolic acidosis/azotemia/hyperkalemia: followed at [facility] for potential transplant.  Nephrology on board and he has been started on hemodialysis starting  04/13/2019.  Abdominal pain/nausea/vomiting: Likely due to uremia. HIDA on 2/19 normal.  RUQ ultrasound without significant finding.  -Continue Protonix, GI cocktail and antiemetics -Manage uremia as above.  He has no more symptoms.  Anemia of renal disease: Baseline Hgb 8-9> 8.2 (admit)>7.9 .  Iron sats low at 12% on 2/4. -IV iron and ESA per nephrology  Uncontrolled hypertension: Likely due to fluid overload and stopping his medications.  Improved. -Change labetalol to Coreg-in the setting of systolic CHF Stopped BiDil by cardiology.  Still slightly elevated.  He is on hydralazine 25 mg 3 times daily.  Continue all management.  He is in dialysis now.  We will see how he does after dialysis.  Tension headache: No red flags but per patient's request, CT head was performed which was unremarkable.  Class II obesity: BMI 35.62.  A1c 4.8%.  Triglycerides 305. -Encourage lifestyle change to lose weight.  Nutrition Problem: Inadequate oral intake Etiology: nausea, vomiting  Signs/Symptoms: per patient/family report  Interventions: Nepro shake   DVT prophylaxis: SCD.  Avoids pork-drived products due to religious affiliation Code Status: Full code Family Communication: No family present.  Discussed in length with patient.  Discharge barrier: Acute CHF in patient solitary kidney and CKD-5 now on HD. Patient is from: Home Final disposition: Likely home when medically stable and cleared by consultants  Consultants: Nephrology, cardiology    Subjective: Patient seen and examined.  He has no complaints.  No headache anymore.  Objective: Vitals:   04/15/19 1300 04/15/19 1330 04/15/19 1400 04/15/19 1417  BP: (!) 169/77 (!) 167/88 (!) 168/91 (!) 158/84  Pulse: 79  76 75 76  Resp:    18  Temp:    98.4 F (36.9 C)  TempSrc:    Oral  SpO2:    98%  Weight:    108 kg  Height:        Intake/Output Summary (Last 24 hours) at 04/15/2019 1513 Last data filed at 04/15/2019 1417 Gross per 24  hour  Intake 600 ml  Output 850 ml  Net -250 ml   Filed Weights   04/15/19 0502 04/15/19 1135 04/15/19 1417  Weight: 106.1 kg 108.4 kg 108 kg    Examination:  General exam: Appears calm and comfortable, obese Respiratory system: Clear to auscultation. Respiratory effort normal. Cardiovascular system: S1 & S2 heard, RRR. No JVD, murmurs, rubs, gallops or clicks.  Trace pitting edema Gastrointestinal system: Abdomen is nondistended, soft and nontender. No organomegaly or masses felt. Normal bowel sounds heard. Central nervous system: Alert and oriented. No focal neurological deficits. Extremities: Symmetric 5 x 5 power. Skin: No rashes, lesions or ulcers.  Psychiatry: Judgement and insight appear normal. Mood & affect appropriate.    Procedures:  None   Microbiology summarized: [id number] negative  Sch Meds:  Scheduled Meds: . calcitRIOL  0.5 mcg Oral Daily  . calcium acetate  1,334 mg Oral TID WC  . carvedilol  25 mg Oral BID WC  . Chlorhexidine Gluconate Cloth  6 each Topical Q0600  . feeding supplement (NEPRO CARB STEADY)  237 mL Oral BID BM  . hydrALAZINE  25 mg Oral Q8H  . pantoprazole  40 mg Oral QHS  . sodium chloride flush  3 mL Intravenous Once  . sodium chloride flush  3 mL Intravenous Q12H   Continuous Infusions: . sodium chloride     PRN Meds:.sodium chloride, acetaminophen, [DISCONTINUED] alum & mag hydroxide-simeth **AND** lidocaine, ondansetron (ZOFRAN) IV, sodium chloride flush  Antimicrobials: Anti-infectives (From admission, onward)   None       I have personally reviewed the following labs and images: CBC: Recent Labs  Lab 04/10/19 1411 04/11/19 1500 04/13/19 0450 04/14/19 0220 04/15/19 0238  WBC  --  6.0 6.2 5.5 5.8  HGB 8.6* 8.2* 7.9* 7.7* 8.2*  HCT  --  27.4* 25.4* 25.0* 26.9*  MCV  --  90.1 87.6 88.0 88.8  PLT  --  231 217 198 212   BMP &GFR Recent Labs  Lab 04/11/19 2212 04/12/19 0320 04/13/19 0450 04/14/19 0220  04/15/19 0238  NA 140 140 138 140 139  K 5.0 5.7* 5.4* 5.4* 4.9  CL 114* 113* 108 107 104  CO2 14* 15* 16* 22 22  GLUCOSE 135* 120* 128* 89 92  BUN 82* 82* 81* 55* 60*  CREATININE 7.98* 7.91* 8.56* 6.72* 7.42*  CALCIUM 6.8* 6.8* 7.3* 7.4* 7.8*  MG  --   --  1.6* 1.5* 2.0  PHOS  --  6.9* 6.8* 5.7* 5.8*   Estimated Creatinine Clearance: 13.8 mL/min (A) (by C-G formula based on SCr of 7.42 mg/dL (H)). Liver & Pancreas: Recent Labs  Lab 04/11/19 1700 04/12/19 0320 04/13/19 0450 04/14/19 0220 04/15/19 0238  AST 12*  --   --   --   --   ALT 18  --   --   --   --   ALKPHOS 72  --   --   --   --   BILITOT 0.6  --   --   --   --   PROT 6.2*  --   --   --   --  ALBUMIN 3.4* 3.0* 3.1* 2.9* 3.1*   Recent Labs  Lab 04/11/19 1700  LIPASE 58*   No results for input(s): AMMONIA in the last 168 hours. Diabetic: Recent Labs    04/13/19 0450  HGBA1C 4.8   Recent Labs  Lab 04/13/19 2221 04/14/19 2101 04/15/19 0733  GLUCAP 87 120* 85   Cardiac Enzymes: No results for input(s): CKTOTAL, CKMB, CKMBINDEX, TROPONINI in the last 168 hours. No results for input(s): PROBNP in the last 8760 hours. Coagulation Profile: No results for input(s): INR, PROTIME in the last 168 hours. Thyroid Function Tests: No results for input(s): TSH, T4TOTAL, FREET4, T3FREE, THYROIDAB in the last 72 hours. Lipid Profile: Recent Labs    04/13/19 0450  CHOL 179  HDL 21*  LDLCALC 97  TRIG 305*  CHOLHDL 8.5   Anemia Panel: No results for input(s): VITAMINB12, FOLATE, FERRITIN, TIBC, IRON, RETICCTPCT in the last 72 hours. Urine analysis:    Component Value Date/Time   COLORURINE STRAW (A) 08/25/2018 1436   APPEARANCEUR CLEAR 08/25/2018 1436   LABSPEC 1.011 08/25/2018 1436   PHURINE 6.0 08/25/2018 1436   GLUCOSEU NEGATIVE 08/25/2018 1436   HGBUR NEGATIVE 08/25/2018 1436   BILIRUBINUR negative 02/06/2019 1529   BILIRUBINUR neg 03/12/2013 1058   KETONESUR negative 02/06/2019 1529   KETONESUR  NEGATIVE 08/25/2018 1436   PROTEINUR >=300 (A) 02/06/2019 1529   PROTEINUR >=300 (A) 08/25/2018 1436   UROBILINOGEN 0.2 02/06/2019 1529   UROBILINOGEN 0.2 07/21/2012 1235   NITRITE Negative 02/06/2019 1529   NITRITE NEGATIVE 08/25/2018 1436   LEUKOCYTESUR Negative 02/06/2019 1529   LEUKOCYTESUR NEGATIVE 08/25/2018 1436   Sepsis Labs: Invalid input(s): PROCALCITONIN, [place]  Microbiology: Recent Results (from the past 240 hour(s))  SARS CORONAVIRUS 2 (TAT 6-24 HRS) Nasopharyngeal Nasopharyngeal Swab     Status: None   Collection Time: 04/11/19  9:01 PM   Specimen: Nasopharyngeal Swab  Result Value Ref Range Status   SARS Coronavirus 2 NEGATIVE NEGATIVE Final    Comment: (NOTE) SARS-CoV-2 target nucleic acids are NOT DETECTED. The SARS-CoV-2 RNA is generally detectable in upper and lower respiratory specimens during the acute phase of infection. Negative results do not preclude SARS-CoV-2 infection, do not rule out co-infections with other pathogens, and should not be used as the sole basis for treatment or other patient management decisions. Negative results must be combined with clinical observations, patient history, and epidemiological information. The expected result is Negative. Fact Sheet for Patients: SugarRoll.be Fact Sheet for Healthcare Providers: https://www.woods-mathews.com/ This test is not yet approved or cleared by the [place] FDA and  has been authorized for detection and/or diagnosis of SARS-CoV-2 by FDA under an Emergency Use Authorization (EUA). This EUA will remain  in effect (meaning this test can be used) for the duration of the COVID-19 declaration under Section 56 4(b)(1) of the Act, 21 U.S.C. section 360bbb-3(b)(1), unless the authorization is terminated or revoked sooner. Performed at [facility], [place] [address], [place], [place] [address]     Radiology Studies: CT HEAD WO  CONTRAST  Result Date: 04/14/2019 CLINICAL DATA:  Headache EXAM: CT HEAD WITHOUT CONTRAST TECHNIQUE: Contiguous axial images were obtained from the base of the skull through the vertex without intravenous contrast. COMPARISON:  04/05/2005 FINDINGS: Brain: No evidence of acute infarction, hemorrhage, hydrocephalus, extra-axial collection or mass lesion/mass effect. Mild periventricular white matter hypodensity and age-appropriate global volume loss slightly increased compared to prior examination. Vascular: No hyperdense vessel or unexpected calcification. Skull: Normal. Negative for fracture or  focal lesion. Sinuses/Orbits: No acute finding. Other: None. IMPRESSION: 1.  No acute intracranial pathology. 2. Mild small-vessel white matter disease and age-appropriate global volume loss, slightly increased compared to remote prior examination dated 2007. Electronically Signed   By: Eddie Candle M.D.   On: 04/14/2019 15:29     Total time spent 26 minutes Darliss Cheney, MD [facility]  If 7PM-7AM, please contact night-coverage [email or web address] 04/15/2019, 3:13 PM

## 2019-04-15 NOTE — Progress Notes (Signed)
[name] Progress Note   54yo man advanced CKD,  dCHF, and HTN p/w  increased DOE. He speaks  Arabic.  Creatinine was 7.91 on consult and BUN is82 and K 5.7.  He follows with [name] at [facility]. He has been on Epogen10,000 unit doseevery two weeks. He underwent left first stage basilic vein transposition on 01/04/19 and had left second stage basilic vein transposition with Dr. Carlis Abbott on 03/11/19.Note Cr 2/3 was 7.16 with BUN 67 and K 5.9, bicarb 15, PTH 444. He was started on daily lokelmaand took several doses and stopped about a week ago given his nausea.   Assessment/ Plan:   #CKD stage V with progression to end-stage disease -Status post AV fistula creation as above. With uremic symptoms which he stateshavebeen managed better here in the hospital. First HD on 2/27 using his existing L AVF.  -Contacted HD SW on 3/1 to beset up withan outpatientHDunit; d/w navigator Seen on  HD  2K bath 1L (580ml net UF) 168/91 lt bbt  #Chronic diastolic CHF - lasix once on 2/28 - Continue othercurrent regimen -Note that cardiology is ultimately planning for ischemia evaluationonce on HD  # nausea  - c/w uremia  #Hypertension - lasix once now - cont other regimen  #Hyperkalemia  - temporized  #Metabolic acidosis - transitioned to HD. Discontinued sodium bicarb  #Anemiaof CKD  -He is on ESA as an outpatient-Epogen 10,000 units-last dose appearsto be2/24. Not yet due.  feraheme x 1 on 2/27.  #Hyperphosphatemia and secondary hyperparathyroidism -intact PTH pending   -Continue binder - Continue calcitriol. On calcitriol 0.5 mcg daily per office notes   Subjective:   Feeling better and denies CP/ dyspnea/ n/v.   Objective:   BP (!) 168/91   Pulse 75   Temp 98.2 F (36.8 C) (Oral)   Resp 18   Ht 5\' 9"  (1.753 m)   Wt 108.4 kg   SpO2 96%   BMI 35.29 kg/m   Intake/Output Summary (Last 24 hours) at 04/15/2019  1416 Last data filed at 04/15/2019 0534 Gross per 24 hour  Intake 600 ml  Output 350 ml  Net 250 ml   Weight change: -4.167 kg  Physical Exam: General adult male in bed in no acute distress Lungs clear to auscultation bilaterally normal work of breathing at rest  Heart regular rate and rhythm no rubs or gallops appreciated Abdomen softly dist obese habitus  Extremities trace edema; no cyanosis or clubbing Access LUE AVF bruit and thrill   Imaging: CT HEAD WO CONTRAST  Result Date: 04/14/2019 CLINICAL DATA:  Headache EXAM: CT HEAD WITHOUT CONTRAST TECHNIQUE: Contiguous axial images were obtained from the base of the skull through the vertex without intravenous contrast. COMPARISON:  04/05/2005 FINDINGS: Brain: No evidence of acute infarction, hemorrhage, hydrocephalus, extra-axial collection or mass lesion/mass effect. Mild periventricular white matter hypodensity and age-appropriate global volume loss slightly increased compared to prior examination. Vascular: No hyperdense vessel or unexpected calcification. Skull: Normal. Negative for fracture or focal lesion. Sinuses/Orbits: No acute finding. Other: None. IMPRESSION: 1.  No acute intracranial pathology. 2. Mild small-vessel white matter disease and age-appropriate global volume loss, slightly increased compared to remote prior examination dated 2007. Electronically Signed   By: Eddie Candle M.D.   On: 04/14/2019 15:29    Labs: BMET Recent Labs  Lab 04/11/19 1500 04/11/19 2212 04/12/19 0320 04/13/19 0450 04/14/19 0220 04/15/19 0238  NA 139 140 140 138 140 139  K 6.1* 5.0 5.7* 5.4* 5.4*  4.9  CL 111 114* 113* 108 107 104  CO2 15* 14* 15* 16* 22 22  GLUCOSE 114* 135* 120* 128* 89 92  BUN 81* 82* 82* 81* 55* 60*  CREATININE 7.96* 7.98* 7.91* 8.56* 6.72* 7.42*  CALCIUM 6.9* 6.8* 6.8* 7.3* 7.4* 7.8*  PHOS  --   --  6.9* 6.8* 5.7* 5.8*   CBC Recent Labs  Lab 04/11/19 1500 04/13/19 0450 04/14/19 0220 04/15/19 0238  WBC 6.0  6.2 5.5 5.8  HGB 8.2* 7.9* 7.7* 8.2*  HCT 27.4* 25.4* 25.0* 26.9*  MCV 90.1 87.6 88.0 88.8  PLT 231 217 198 212    Medications:    . calcitRIOL  0.5 mcg Oral Daily  . calcium acetate  1,334 mg Oral TID WC  . carvedilol  25 mg Oral BID WC  . Chlorhexidine Gluconate Cloth  6 each Topical Q0600  . feeding supplement (NEPRO CARB STEADY)  237 mL Oral BID BM  . hydrALAZINE  25 mg Oral Q8H  . pantoprazole  40 mg Oral QHS  . sodium chloride flush  3 mL Intravenous Once  . sodium chloride flush  3 mL Intravenous Q12H      Otelia Santee, MD 04/15/2019, 2:16 PM

## 2019-04-15 NOTE — Progress Notes (Signed)
Progress Note  Patient Name: [name] Date of Encounter: 04/15/2019  Primary Cardiologist: Ena Dawley, MD   Subjective   No CP or dyspnea; HA improved  Inpatient Medications    Scheduled Meds: . calcitRIOL  0.5 mcg Oral Daily  . calcium acetate  1,334 mg Oral TID WC  . carvedilol  25 mg Oral BID WC  . Chlorhexidine Gluconate Cloth  6 each Topical Q0600  . feeding supplement (NEPRO CARB STEADY)  237 mL Oral BID BM  . hydrALAZINE  25 mg Oral Q8H  . pantoprazole  40 mg Oral QHS  . sodium chloride flush  3 mL Intravenous Once  . sodium chloride flush  3 mL Intravenous Q12H   Continuous Infusions: . sodium chloride     PRN Meds: sodium chloride, acetaminophen, [DISCONTINUED] alum & mag hydroxide-simeth **AND** lidocaine, ondansetron (ZOFRAN) IV, sodium chloride flush   Vital Signs    Vitals:   04/14/19 1451 04/14/19 1727 04/14/19 1955 04/15/19 0502  BP: (!) 142/81 139/80 127/72 (!) 168/95  Pulse:  81 77 82  Resp:  18 18 16   Temp:  98 F (36.7 C) 98.2 F (36.8 C) 98.3 F (36.8 C)  TempSrc:  Oral Oral Oral  SpO2:  98% 97% 100%  Weight:    106.1 kg  Height:        Intake/Output Summary (Last 24 hours) at 04/15/2019 0839 Last data filed at 04/15/2019 0534 Gross per 24 hour  Intake 840 ml  Output 350 ml  Net 490 ml   Last 3 Weights 04/15/2019 04/14/2019 04/13/2019  Weight (lbs) 234 lb 233 lb 1.6 oz 244 lb 11.4 oz  Weight (kg) 106.142 kg 105.733 kg 111 kg      Telemetry    Sinus- Personally Reviewed   Physical Exam   GEN: No acute distress.   Neck: No JVD Cardiac: RRR, no murmurs, rubs, or gallops.  Respiratory: Clear to auscultation bilaterally. GI: Soft, nontender, non-distended  MS: No edema Neuro:  Nonfocal  Psych: Normal affect   Labs    High Sensitivity Troponin:   Recent Labs  Lab 04/11/19 1500 04/11/19 1700  TROPONINIHS 15 17      Chemistry Recent Labs  Lab 04/11/19 1700 04/11/19 2212 04/13/19 0450 04/14/19 0220 04/15/19 0238    NA  --    < > 138 140 139  K  --    < > 5.4* 5.4* 4.9  CL  --    < > 108 107 104  CO2  --    < > 16* 22 22  GLUCOSE  --    < > 128* 89 92  BUN  --    < > 81* 55* 60*  CREATININE  --    < > 8.56* 6.72* 7.42*  CALCIUM  --    < > 7.3* 7.4* 7.8*  PROT 6.2*  --   --   --   --   ALBUMIN 3.4*   < > 3.1* 2.9* 3.1*  AST 12*  --   --   --   --   ALT 18  --   --   --   --   ALKPHOS 72  --   --   --   --   BILITOT 0.6  --   --   --   --   GFRNONAA  --    < > 6* 8* 8*  GFRAA  --    < > 7* 10* 9*  ANIONGAP  --    < >  14 11 13    < > = values in this interval not displayed.     Hematology Recent Labs  Lab 04/13/19 0450 04/14/19 0220 04/15/19 0238  WBC 6.2 5.5 5.8  RBC 2.90* 2.84* 3.03*  HGB 7.9* 7.7* 8.2*  HCT 25.4* 25.0* 26.9*  MCV 87.6 88.0 88.8  MCH 27.2 27.1 27.1  MCHC 31.1 30.8 30.5  RDW 14.7 14.9 15.0  PLT 217 198 212    BNP Recent Labs  Lab 04/11/19 1537  BNP 752.1*     DDimer No results for input(s): DDIMER in the last 168 hours.   Radiology    CT HEAD WO CONTRAST  Result Date: 04/14/2019 CLINICAL DATA:  Headache EXAM: CT HEAD WITHOUT CONTRAST TECHNIQUE: Contiguous axial images were obtained from the base of the skull through the vertex without intravenous contrast. COMPARISON:  04/05/2005 FINDINGS: Brain: No evidence of acute infarction, hemorrhage, hydrocephalus, extra-axial collection or mass lesion/mass effect. Mild periventricular white matter hypodensity and age-appropriate global volume loss slightly increased compared to prior examination. Vascular: No hyperdense vessel or unexpected calcification. Skull: Normal. Negative for fracture or focal lesion. Sinuses/Orbits: No acute finding. Other: None. IMPRESSION: 1.  No acute intracranial pathology. 2. Mild small-vessel white matter disease and age-appropriate global volume loss, slightly increased compared to remote prior examination dated 2007. Electronically Signed   By: Eddie Candle M.D.   On: 04/14/2019 15:29      Patient Profile     54 y.o. male with a hx of HTN, hyperlipidemia, CKD V pending initiation of HD in March,who is being seen for the evaluationof acute heart failure exacerbation with newly reduced EF.  Echocardiogram this admission shows ejection fraction 35 to 40%, moderate left ventricular enlargement, grade 2 diastolic dysfunction, moderate pulmonary hypertension, moderate biatrial enlargement, mild to moderate mitral regurgitation.  Assessment & Plan    1 acute systolic congestive heart failure-volume status is improving.  Now being managed by dialysis.  2 cardiomyopathy-etiology unclear.  Question secondary to hypertension.  Continue carvedilol and hydralazine.  He did not tolerate nitrates secondary to headache.  Would favor continuing dialysis and titrating medications for reduced ejection fraction.  Would then repeat echocardiogram 3 months later.  If ejection fraction remains decreased could then consider ischemia evaluation such as catheterization.  3 end-stage renal disease-now on dialysis.  Per nephrology.  4 hypertension-blood pressure much improved following initiation of dialysis.  Continue to follow and adjust regimen as needed.  [facility] will sign off.   Medication Recommendations: Continue present medications from a cardiac standpoint. Other recommendations (labs, testing, etc): Follow-up echocardiogram 3 months after medications fully titrated. Follow up as an outpatient: Dr. [name] 4 to 6 weeks following discharge   For questions or updates, please contact [place] Please consult [email or web address] for contact info under        Signed, Kirk Ruths, MD  04/15/2019, 8:39 AM  '

## 2019-04-16 LAB — CBC
HCT: 27.5 % — ABNORMAL LOW (ref 39.0–52.0)
Hemoglobin: 8.7 g/dL — ABNORMAL LOW (ref 13.0–17.0)
MCH: 27.5 pg (ref 26.0–34.0)
MCHC: 31.6 g/dL (ref 30.0–36.0)
MCV: 87 fL (ref 80.0–100.0)
Platelets: 201 10*3/uL (ref 150–400)
RBC: 3.16 MIL/uL — ABNORMAL LOW (ref 4.22–5.81)
RDW: 14.7 % (ref 11.5–15.5)
WBC: 5.6 10*3/uL (ref 4.0–10.5)
nRBC: 0 % (ref 0.0–0.2)

## 2019-04-16 LAB — PTH, INTACT AND CALCIUM
Calcium, Total (PTH): 7.4 mg/dL — ABNORMAL LOW (ref 8.7–10.2)
PTH: 181 pg/mL — ABNORMAL HIGH (ref 15–65)

## 2019-04-16 LAB — RENAL FUNCTION PANEL
Albumin: 3 g/dL — ABNORMAL LOW (ref 3.5–5.0)
Anion gap: 10 (ref 5–15)
BUN: 43 mg/dL — ABNORMAL HIGH (ref 6–20)
CO2: 27 mmol/L (ref 22–32)
Calcium: 8.1 mg/dL — ABNORMAL LOW (ref 8.9–10.3)
Chloride: 103 mmol/L (ref 98–111)
Creatinine, Ser: 6.08 mg/dL — ABNORMAL HIGH (ref 0.61–1.24)
GFR calc Af Amer: 11 mL/min — ABNORMAL LOW (ref >60)
GFR calc non Af Amer: 10 mL/min — ABNORMAL LOW (ref >60)
Glucose, Bld: 85 mg/dL (ref 70–99)
Phosphorus: 5.4 mg/dL — ABNORMAL HIGH (ref 2.5–4.6)
Potassium: 4.8 mmol/L (ref 3.5–5.1)
Sodium: 140 mmol/L (ref 135–145)

## 2019-04-16 LAB — MAGNESIUM: Magnesium: 1.7 mg/dL (ref 1.7–2.4)

## 2019-04-16 LAB — HEPATITIS B SURFACE ANTIGEN: Hepatitis B Surface Ag: NONREACTIVE

## 2019-04-16 MED ORDER — HYDRALAZINE HCL 20 MG/ML IJ SOLN: 10.0000 mg | Freq: Four times a day (QID) | INTRAMUSCULAR | Status: DC | PRN

## 2019-04-16 MED ORDER — AMLODIPINE BESYLATE 5 MG PO TABS
5.0000 mg | ORAL_TABLET | Freq: Every day | ORAL | Status: DC
  Administered 2019-04-16 – 2019-04-17 (×2): 5 mg via ORAL
  Filled 2019-04-16 (×2): qty 1

## 2019-04-16 NOTE — Progress Notes (Signed)
Nutrition Follow-up   RD working remotely.  DOCUMENTATION CODES:   Obesity unspecified  INTERVENTION:  Continue Nepro Shake po BID, each supplement provides 425 kcal and 19 grams protein  Encourage adequate PO intake  Handouts regarding renal diet placed in discharge instructions.  NUTRITION DIAGNOSIS:   Inadequate oral intake related to nausea, vomiting as evidenced by per patient/family report; improving  GOAL:   Patient will meet greater than or equal to 90% of their needs; progressing  MONITOR:   PO intake, Supplement acceptance, Weight trends, Labs, I & O's  REASON FOR ASSESSMENT:   Malnutrition Screening Tool    ASSESSMENT:   Pt with a PMH significant for solitary kidney after right nephrectomy for RCC?,  CKD-5 on list for transplant, HTN, anemia of renal disease, PUD and HLD presenting with complaints of shortness of breath, DOE, orthopnea, edema and headache.  Pt noted to have recently stopped taking his medications. Pt admitted for volume overload due to CHF exacerbation.  HD started 2/27.  Pt unavailable during attempted time of contact. Meal completion has been 100%. Pt currently has Nepro shake ordered and has been consuming them. RD to continue with current orders to aid in caloric and protein needs. Pt requesting diet education. Handouts "Low sodium nutrition therapy" from the Academy of Nutrition and Dietetics Manual as well as a handout "Eating Healthy with Kidney Disease" placed in pt discharge instructions.   Labs and medications reviewed.   Diet Order:   Diet Order            Diet renal/carb modified with fluid restriction Diet-HS Snack? Nothing; Fluid restriction: 1200 mL Fluid; Room service appropriate? Yes; Fluid consistency: Thin  Diet effective now              EDUCATION NEEDS:   No education needs have been identified at this time  Skin:  Skin Assessment: Reviewed RN Assessment  Last BM:  2/28  Height:   Ht Readings from Last 1  Encounters:  04/11/19 5\' 9"  (1.753 m)    Weight:   Wt Readings from Last 1 Encounters:  04/16/19 105.2 kg    Ideal Body Weight:     BMI:  Body mass index is 34.26 kg/m.  Estimated Nutritional Needs:   Kcal:  2050-2250  Protein:  130-140 grams  Fluid:  1222ml (per MD)    Corrin Parker, MS, RD, LDN RD pager number/after hours weekend pager number on Amion.

## 2019-04-16 NOTE — Progress Notes (Signed)
PROGRESS NOTE  [name] JOA:[id number] DOB: [birth year]   PCP: Forrest Moron, MD  Patient is from: Home.  Independently ambulates at baseline.  DOA: 04/11/2019 LOS: 5  Brief Narrative / Interim history: 54 year old male with solitary kidney after right nephrectomy for RCC?,  CKD-5 on list for transplant, HTN, anemia of renal disease, PUD and HLD presenting with complaints of shortness of breath, DOE, orthopnea, edema and headache.  Stated that he is on multiple medications which caused him headache. So stopped his medications.   In ED, SBP in 180s.  Saturation normal on RA.  Hgb 8.2 (baseline).  K6.1.  Bicarb 15.  Cr/BUN 7.9/81 (baseline).  HS trop negative x2.  LFT within normal.  Lipase 58.  COVID-19 negative.  BNP 752.  CXR concerning for mild cardiomegaly and pulm vasc congestion with trace bilateral pleural effusions.  Started on IV Lasix and oral labetalol, and admitted for volume overload due to CHF.   The next day, Echo with EF of 35 to 40% (60 to 65% in 08/2018), global hypokinesis, G2 DD, moderate LAE and RAE and aortic dilation. Nephrology and cardiology consulted.  Patient to start dialysis.  Cardiology planning "outpatient LHC in near future".   Assessment & Plan:  Acute combined CHF: Presents with cardinal symptoms.  Echo with EF of 35 to 40% (60 to 65% in 08/2018), global hypokinesis, grade 2 diastolic dysfunction, moderate LAE and RAE and aortic dilation.  Nephrology and cardiology on board.  Started on HD on 04/13/2019. Stopped BiDil.  Continue carvedilol. -Monitor fluid status, renal function and electrolytes -Sodium and fluid restrictions.  He has been started on dialysis so that is managing his CHF.  Cardiology signed off. Renal navigator working with patient to find an outpatient dialysis place and completing paperwork.  CKD-5 progressing to nonoliguric ESRD/solitary kidney s/p right nephrectomy for RCC?/Metabolic acidosis/azotemia/hyperkalemia: followed at [facility] for  potential transplant.  Nephrology on board and he has been started on hemodialysis starting 04/13/2019.  Abdominal pain/nausea/vomiting: Likely due to uremia. HIDA on 2/19 normal.  RUQ ultrasound without significant finding.  -Continue Protonix, GI cocktail and antiemetics -Manage uremia as above.  He has no more symptoms.  Anemia of chronic disease/renal disease: Baseline Hgb 8-9> 8.2 (admit)>7.9 .  Iron sats low at 12% on 2/4. -IV iron and ESA per nephrology  Uncontrolled hypertension: Likely due to fluid overload and stopping his medications.  -Change labetalol to Coreg-in the setting of systolic CHF Stopped BiDil by cardiology. BP still slightly elevated.  He is on hydralazine 25 mg 3 times daily.  Continue all management. Add amlodipine 5 mg p.o. daily.   Tension headache: No red flags but per patient's request, CT head was performed which was unremarkable.  Class II obesity: BMI 35.62.  A1c 4.8%.  Triglycerides 305. -Encourage lifestyle change to lose weight.  Nutrition Problem: Inadequate oral intake Etiology: nausea, vomiting  Signs/Symptoms: per patient/family report  Interventions: Nepro shake   DVT prophylaxis: SCD.  Avoids pork-drived products due to religious affiliation Code Status: Full code Family Communication: No family present.  Discussed in length with patient.  Discharge barrier: Acute CHF in patient solitary kidney and CKD-5 now on HD. Needs place for outpatient hemodialysis NP for work-up before discharge. Patient is from: Home Final disposition: Home once outpatient HD set up is completed for him  Consultants: Nephrology, cardiology    Subjective: Patient seen and examined. No complaints..  Objective: Vitals:   04/15/19 2118 04/16/19 0500 04/16/19 0913 04/16/19 0915  BP: Marland Kitchen)  148/92 133/83  (!) 155/87  Pulse: 82 78 86 86  Resp: 18 18    Temp: 98.2 F (36.8 C) 98.4 F (36.9 C)    TempSrc: Oral Oral    SpO2: 97% 98%    Weight:  105.2 kg      Height:        Intake/Output Summary (Last 24 hours) at 04/16/2019 1332 Last data filed at 04/16/2019 0913 Gross per 24 hour  Intake 603 ml  Output 1200 ml  Net -597 ml   Filed Weights   04/15/19 1135 04/15/19 1417 04/16/19 0500  Weight: 108.4 kg 108 kg 105.2 kg    Examination:  General exam: Appears calm and comfortable, obese  respiratory system: Clear to auscultation. Respiratory effort normal. Cardiovascular system: S1 & S2 heard, RRR. No JVD, murmurs, rubs, gallops or clicks. Trace pitting edema bilateral lower extremity Gastrointestinal system: Abdomen is nondistended, soft and nontender. No organomegaly or masses felt. Normal bowel sounds heard. Central nervous system: Alert and oriented. No focal neurological deficits. Extremities: Symmetric 5 x 5 power. Skin: No rashes, lesions or ulcers.  Psychiatry: Judgement and insight appear normal. Mood & affect appropriate.    Procedures:  None   Microbiology summarized: [id number] negative  Sch Meds:  Scheduled Meds: . amLODipine  5 mg Oral Daily  . calcitRIOL  0.5 mcg Oral Daily  . calcium acetate  1,334 mg Oral TID WC  . carvedilol  25 mg Oral BID WC  . Chlorhexidine Gluconate Cloth  6 each Topical Q0600  . feeding supplement (NEPRO CARB STEADY)  237 mL Oral BID BM  . hydrALAZINE  25 mg Oral Q8H  . pantoprazole  40 mg Oral QHS  . sodium chloride flush  3 mL Intravenous Once  . sodium chloride flush  3 mL Intravenous Q12H   Continuous Infusions: . sodium chloride     PRN Meds:.sodium chloride, acetaminophen, hydrALAZINE, [DISCONTINUED] alum & mag hydroxide-simeth **AND** lidocaine, ondansetron (ZOFRAN) IV, sodium chloride flush  Antimicrobials: Anti-infectives (From admission, onward)   None       I have personally reviewed the following labs and images: CBC: Recent Labs  Lab 04/11/19 1500 04/13/19 0450 04/14/19 0220 04/15/19 0238 04/16/19 0328  WBC 6.0 6.2 5.5 5.8 5.6  HGB 8.2* 7.9* 7.7* 8.2* 8.7*   HCT 27.4* 25.4* 25.0* 26.9* 27.5*  MCV 90.1 87.6 88.0 88.8 87.0  PLT 231 217 198 212 201   BMP &GFR Recent Labs  Lab 04/12/19 0320 04/13/19 0450 04/14/19 0220 04/15/19 0238 04/16/19 0328  NA 140 138 140 139 140  K 5.7* 5.4* 5.4* 4.9 4.8  CL 113* 108 107 104 103  CO2 15* 16* 22 22 27   GLUCOSE 120* 128* 89 92 85  BUN 82* 81* 55* 60* 43*  CREATININE 7.91* 8.56* 6.72* 7.42* 6.08*  CALCIUM 6.8* 7.3*  7.4* 7.4* 7.8* 8.1*  MG  --  1.6* 1.5* 2.0 1.7  PHOS 6.9* 6.8* 5.7* 5.8* 5.4*   Estimated Creatinine Clearance: 16.6 mL/min (A) (by C-G formula based on SCr of 6.08 mg/dL (H)). Liver & Pancreas: Recent Labs  Lab 04/11/19 1700 04/11/19 1700 04/12/19 0320 04/13/19 0450 04/14/19 0220 04/15/19 0238 04/16/19 0328  AST 12*  --   --   --   --   --   --   ALT 18  --   --   --   --   --   --   ALKPHOS 72  --   --   --   --   --   --  BILITOT 0.6  --   --   --   --   --   --   PROT 6.2*  --   --   --   --   --   --   ALBUMIN 3.4*   < > 3.0* 3.1* 2.9* 3.1* 3.0*   < > = values in this interval not displayed.   Recent Labs  Lab 04/11/19 1700  LIPASE 58*   No results for input(s): AMMONIA in the last 168 hours. Diabetic: No results for input(s): HGBA1C in the last 72 hours. Recent Labs  Lab 04/13/19 2221 04/14/19 2101 04/15/19 0733 04/15/19 1701  GLUCAP 87 120* 85 128*   Cardiac Enzymes: No results for input(s): CKTOTAL, CKMB, CKMBINDEX, TROPONINI in the last 168 hours. No results for input(s): PROBNP in the last 8760 hours. Coagulation Profile: No results for input(s): INR, PROTIME in the last 168 hours. Thyroid Function Tests: No results for input(s): TSH, T4TOTAL, FREET4, T3FREE, THYROIDAB in the last 72 hours. Lipid Profile: No results for input(s): CHOL, HDL, LDLCALC, TRIG, CHOLHDL, LDLDIRECT in the last 72 hours. Anemia Panel: No results for input(s): VITAMINB12, FOLATE, FERRITIN, TIBC, IRON, RETICCTPCT in the last 72 hours. Urine analysis:    Component Value  Date/Time   COLORURINE STRAW (A) 08/25/2018 1436   APPEARANCEUR CLEAR 08/25/2018 1436   LABSPEC 1.011 08/25/2018 1436   PHURINE 6.0 08/25/2018 1436   GLUCOSEU NEGATIVE 08/25/2018 1436   HGBUR NEGATIVE 08/25/2018 1436   BILIRUBINUR negative 02/06/2019 1529   BILIRUBINUR neg 03/12/2013 1058   KETONESUR negative 02/06/2019 1529   KETONESUR NEGATIVE 08/25/2018 1436   PROTEINUR >=300 (A) 02/06/2019 1529   PROTEINUR >=300 (A) 08/25/2018 1436   UROBILINOGEN 0.2 02/06/2019 1529   UROBILINOGEN 0.2 07/21/2012 1235   NITRITE Negative 02/06/2019 1529   NITRITE NEGATIVE 08/25/2018 1436   LEUKOCYTESUR Negative 02/06/2019 1529   LEUKOCYTESUR NEGATIVE 08/25/2018 1436   Sepsis Labs: Invalid input(s): PROCALCITONIN, [place]  Microbiology: Recent Results (from the past 240 hour(s))  SARS CORONAVIRUS 2 (TAT 6-24 HRS) Nasopharyngeal Nasopharyngeal Swab     Status: None   Collection Time: 04/11/19  9:01 PM   Specimen: Nasopharyngeal Swab  Result Value Ref Range Status   SARS Coronavirus 2 NEGATIVE NEGATIVE Final    Comment: (NOTE) SARS-CoV-2 target nucleic acids are NOT DETECTED. The SARS-CoV-2 RNA is generally detectable in upper and lower respiratory specimens during the acute phase of infection. Negative results do not preclude SARS-CoV-2 infection, do not rule out co-infections with other pathogens, and should not be used as the sole basis for treatment or other patient management decisions. Negative results must be combined with clinical observations, patient history, and epidemiological information. The expected result is Negative. Fact Sheet for Patients: SugarRoll.be Fact Sheet for Healthcare Providers: https://www.woods-mathews.com/ This test is not yet approved or cleared by the [place] FDA and  has been authorized for detection and/or diagnosis of SARS-CoV-2 by FDA under an Emergency Use Authorization (EUA). This EUA will remain    in effect (meaning this test can be used) for the duration of the COVID-19 declaration under Section 56 4(b)(1) of the Act, 21 U.S.C. section 360bbb-3(b)(1), unless the authorization is terminated or revoked sooner. Performed at [facility], [place] [address] [place], [place] [address]     Radiology Studies: No results found.   Total time spent 25 minutes Darliss Cheney, MD [facility]  If 7PM-7AM, please contact night-coverage [email or web address] 04/16/2019, 1:32 PM

## 2019-04-16 NOTE — Plan of Care (Signed)
  Problem: Education: Goal: Knowledge of General Education information will improve Description: Including pain rating scale, medication(s)/side effects and non-pharmacologic comfort measures Outcome: Progressing   Problem: Health Behavior/Discharge Planning: Goal: Ability to manage health-related needs will improve Outcome: Progressing   Problem: Clinical Measurements: Goal: Ability to maintain clinical measurements within normal limits will improve Outcome: Progressing Goal: Will remain free from infection Outcome: Progressing Goal: Diagnostic test results will improve Outcome: Progressing Goal: Respiratory complications will improve Outcome: Progressing Goal: Cardiovascular complication will be avoided Outcome: Progressing   Problem: Activity: Goal: Risk for activity intolerance will decrease Outcome: Progressing   Problem: Nutrition: Goal: Adequate nutrition will be maintained Outcome: Progressing   Problem: Coping: Goal: Level of anxiety will decrease Outcome: Progressing   Problem: Elimination: Goal: Will not experience complications related to bowel motility Outcome: Progressing Goal: Will not experience complications related to urinary retention Outcome: Progressing   Problem: Pain Managment: Goal: General experience of comfort will improve Outcome: Progressing   Problem: Safety: Goal: Ability to remain free from injury will improve Outcome: Progressing   Problem: Skin Integrity: Goal: Risk for impaired skin integrity will decrease Outcome: Progressing   Problem: Activity: Goal: Capacity to carry out activities will improve Outcome: Progressing   Problem: Cardiac: Goal: Ability to achieve and maintain adequate cardiopulmonary perfusion will improve Outcome: Progressing   Elesa Hacker, RN

## 2019-04-16 NOTE — Progress Notes (Signed)
Renal Navigator awaiting HepB surface antigen lab to be drawn and result before patient can be given an OP HD seat schedule. Renal Navigator continuing to follow.  Alphonzo Cruise, [place] Renal Navigator [phone]

## 2019-04-16 NOTE — Progress Notes (Addendum)
Renal Navigator met with patient yesterday afternoon to discuss OP HD referral for ESRD per notification by Nephrologist/Dr. Augustin Coupe. Patient was pleasant and receptive to visit. He speaks [place] fluently, though Arabic is his first language. He states he will need application completed for transportation to OP HD. Renal Navigator will meet with him later today to complete SCAT application.  OP HD referral completed, pending HepB surface antigen lab result-spoke with Dr. Lin/Nephrologist to order, and submitted to [facility]missions to request treatment at [facility], which is closest to patient's home. He has no preference on schedule or seat time. Renal Navigator will continue to follow closely through discharge.  Alphonzo Cruise, [place] Renal Navigator [phone]

## 2019-04-16 NOTE — Progress Notes (Signed)
[facility] Progress Note    54yo man advanced CKD,  dCHF, and HTN p/w  increased DOE. He speaks  Arabic.  Creatinine was 7.91 on consult and BUN is82 and K 5.7.  He follows with [name] at [facility]. He has been on Epogen10,000 unit doseevery two weeks. He underwent left first stage basilic vein transposition on 01/04/19 and had left second stage basilic vein transposition with Dr. Carlis Abbott on 03/11/19.Note Cr 2/3 was 7.16 with BUN 67 and K 5.9, bicarb 15, PTH 444. He was started on daily lokelmaand took several doses and stopped about a week ago given his nausea.  Assessment/ Plan:   #CKD stage V with progression to end-stage disease -Status post AV fistula creation as above. With uremic symptoms which he stateshavebeen managed better here in the hospital. First HD on 2/27 using his existing L AVF. -Contacted HD SW on 3/1 to beset up withan outpatientHDunit; d/w navigator -> still doesn't have a spot yet.  Last HD Mon -> plan next for Wed  #Chronic diastolic CHF - lasix once on 2/28 - Continue othercurrent regimen -Note that cardiology is ultimately planning for ischemia evaluationonce on HD  # nausea  - c/w uremia  #Hypertension -better controlled since being hospitalized  #Hyperkalemia  - temporized  #Metabolic acidosis - transitioned to HD. Discontinued sodium bicarb  #Anemiaof CKD  -He is on ESA as an outpatient-Epogen 10,000 units- last dose appearsto be2/24. Not yet due. feraheme x 1 on 2/27.  #Hyperphosphatemia and secondary hyperparathyroidism -intact PTH pending  -Continue binder -> phos down to 5.4 3/2 - Continue calcitriol. On calcitriol 0.5 mcg daily per office notes  Subjective:   Feeling better and denies CP/ dyspnea/ n/v. Wondering about a band in his left forearm. Seems very minimal.   Objective:   BP (!) 146/88 (BP Location: Right Arm)   Pulse 85   Temp 99.3 F (37.4 C) (Oral)    Resp (!) 21   Ht 5\' 9"  (1.753 m)   Wt 105.2 kg   SpO2 99%   BMI 34.26 kg/m   Intake/Output Summary (Last 24 hours) at 04/16/2019 1404 Last data filed at 04/16/2019 1338 Gross per 24 hour  Intake 1083 ml  Output 1200 ml  Net -117 ml   Weight change: 2.667 kg  Physical Exam: General adult male in bed in no acute distress Lungs clear to auscultation bilaterally normal work of breathing at rest  Heart regular rate and rhythm no rubs or gallops appreciated Abdomen softly dist obese habitus, protuberant Extremitiestraceedema; no cyanosis or clubbing Access LUE BBT bruit and thrill   Imaging: CT HEAD WO CONTRAST  Result Date: 04/14/2019 CLINICAL DATA:  Headache EXAM: CT HEAD WITHOUT CONTRAST TECHNIQUE: Contiguous axial images were obtained from the base of the skull through the vertex without intravenous contrast. COMPARISON:  04/05/2005 FINDINGS: Brain: No evidence of acute infarction, hemorrhage, hydrocephalus, extra-axial collection or mass lesion/mass effect. Mild periventricular white matter hypodensity and age-appropriate global volume loss slightly increased compared to prior examination. Vascular: No hyperdense vessel or unexpected calcification. Skull: Normal. Negative for fracture or focal lesion. Sinuses/Orbits: No acute finding. Other: None. IMPRESSION: 1.  No acute intracranial pathology. 2. Mild small-vessel white matter disease and age-appropriate global volume loss, slightly increased compared to remote prior examination dated 2007. Electronically Signed   By: Eddie Candle M.D.   On: 04/14/2019 15:29    Labs: BMET Recent Labs  Lab 04/11/19 1500 04/11/19 1500 04/11/19 2212 04/12/19 0320 04/13/19 0450 04/14/19  2641 04/15/19 0238 04/16/19 0328  NA 139  --  140 140 138 140 139 140  K 6.1*  --  5.0 5.7* 5.4* 5.4* 4.9 4.8  CL 111  --  114* 113* 108 107 104 103  CO2 15*  --  14* 15* 16* 22 22 27   GLUCOSE 114*  --  135* 120* 128* 89 92 85  BUN 81*  --  82* 82* 81* 55*  60* 43*  CREATININE 7.96*  --  7.98* 7.91* 8.56* 6.72* 7.42* 6.08*  CALCIUM 6.9*   < > 6.8* 6.8* 7.3*  7.4* 7.4* 7.8* 8.1*  PHOS  --   --   --  6.9* 6.8* 5.7* 5.8* 5.4*   < > = values in this interval not displayed.   CBC Recent Labs  Lab 04/13/19 0450 04/14/19 0220 04/15/19 0238 04/16/19 0328  WBC 6.2 5.5 5.8 5.6  HGB 7.9* 7.7* 8.2* 8.7*  HCT 25.4* 25.0* 26.9* 27.5*  MCV 87.6 88.0 88.8 87.0  PLT 217 198 212 201    Medications:    . amLODipine  5 mg Oral Daily  . calcitRIOL  0.5 mcg Oral Daily  . calcium acetate  1,334 mg Oral TID WC  . carvedilol  25 mg Oral BID WC  . Chlorhexidine Gluconate Cloth  6 each Topical Q0600  . feeding supplement (NEPRO CARB STEADY)  237 mL Oral BID BM  . hydrALAZINE  25 mg Oral Q8H  . pantoprazole  40 mg Oral QHS  . sodium chloride flush  3 mL Intravenous Once  . sodium chloride flush  3 mL Intravenous Q12H      Otelia Santee, MD 04/16/2019, 2:04 PM

## 2019-04-17 ENCOUNTER — Encounter (HOSPITAL_COMMUNITY): Payer: Self-pay

## 2019-04-17 ENCOUNTER — Inpatient Hospital Stay (HOSPITAL_COMMUNITY): Admission: RE | Admit: 2019-04-17 | Payer: 59 | Source: Ambulatory Visit

## 2019-04-17 DIAGNOSIS — N2581 Secondary hyperparathyroidism of renal origin: Secondary | ICD-10-CM | POA: Insufficient documentation

## 2019-04-17 DIAGNOSIS — Z111 Encounter for screening for respiratory tuberculosis: Secondary | ICD-10-CM | POA: Insufficient documentation

## 2019-04-17 DIAGNOSIS — R52 Pain, unspecified: Secondary | ICD-10-CM | POA: Insufficient documentation

## 2019-04-17 DIAGNOSIS — N186 End stage renal disease: Secondary | ICD-10-CM | POA: Insufficient documentation

## 2019-04-17 DIAGNOSIS — D631 Anemia in chronic kidney disease: Secondary | ICD-10-CM | POA: Insufficient documentation

## 2019-04-17 DIAGNOSIS — N189 Chronic kidney disease, unspecified: Secondary | ICD-10-CM | POA: Insufficient documentation

## 2019-04-17 DIAGNOSIS — Z992 Dependence on renal dialysis: Secondary | ICD-10-CM | POA: Insufficient documentation

## 2019-04-17 DIAGNOSIS — L299 Pruritus, unspecified: Secondary | ICD-10-CM | POA: Insufficient documentation

## 2019-04-17 LAB — RENAL FUNCTION PANEL
Albumin: 3 g/dL — ABNORMAL LOW (ref 3.5–5.0)
Anion gap: 12 (ref 5–15)
BUN: 57 mg/dL — ABNORMAL HIGH (ref 6–20)
CO2: 25 mmol/L (ref 22–32)
Calcium: 8.2 mg/dL — ABNORMAL LOW (ref 8.9–10.3)
Chloride: 102 mmol/L (ref 98–111)
Creatinine, Ser: 7.14 mg/dL — ABNORMAL HIGH (ref 0.61–1.24)
GFR calc Af Amer: 9 mL/min — ABNORMAL LOW (ref >60)
GFR calc non Af Amer: 8 mL/min — ABNORMAL LOW (ref >60)
Glucose, Bld: 90 mg/dL (ref 70–99)
Phosphorus: 6.3 mg/dL — ABNORMAL HIGH (ref 2.5–4.6)
Potassium: 4.3 mmol/L (ref 3.5–5.1)
Sodium: 139 mmol/L (ref 135–145)

## 2019-04-17 LAB — CBC
HCT: 27.8 % — ABNORMAL LOW (ref 39.0–52.0)
Hemoglobin: 8.5 g/dL — ABNORMAL LOW (ref 13.0–17.0)
MCH: 26.8 pg (ref 26.0–34.0)
MCHC: 30.6 g/dL (ref 30.0–36.0)
MCV: 87.7 fL (ref 80.0–100.0)
Platelets: 203 10*3/uL (ref 150–400)
RBC: 3.17 MIL/uL — ABNORMAL LOW (ref 4.22–5.81)
RDW: 14.9 % (ref 11.5–15.5)
WBC: 5.9 10*3/uL (ref 4.0–10.5)
nRBC: 0 % (ref 0.0–0.2)

## 2019-04-17 LAB — MAGNESIUM: Magnesium: 1.9 mg/dL (ref 1.7–2.4)

## 2019-04-17 LAB — HEPATITIS B SURFACE ANTIGEN: Hepatitis B Surface Ag: NONREACTIVE

## 2019-04-17 MED ORDER — CALCITRIOL 0.5 MCG PO CAPS: 0.5000 ug | ORAL_CAPSULE | Freq: Every day | ORAL | 0 refills | Status: AC

## 2019-04-17 MED ORDER — CARVEDILOL 25 MG PO TABS: 25.0000 mg | ORAL_TABLET | Freq: Two times a day (BID) | ORAL | 0 refills | Status: AC

## 2019-04-17 MED ORDER — CALCITRIOL 0.5 MCG PO CAPS
ORAL_CAPSULE | ORAL | Status: AC
  Filled 2019-04-17: qty 1

## 2019-04-17 MED ORDER — AMLODIPINE BESYLATE 5 MG PO TABS: 5.0000 mg | ORAL_TABLET | Freq: Every day | ORAL | 0 refills | Status: AC

## 2019-04-17 MED ORDER — HYDRALAZINE HCL 25 MG PO TABS: 25.0000 mg | ORAL_TABLET | Freq: Three times a day (TID) | ORAL | 0 refills | Status: AC

## 2019-04-17 MED ORDER — BUTALBITAL-APAP-CAFFEINE 50-325-40 MG PO TABS
1.0000 | ORAL_TABLET | Freq: Once | ORAL | Status: DC
  Filled 2019-04-17: qty 1

## 2019-04-17 MED ORDER — CALCIUM ACETATE (PHOS BINDER) 667 MG PO CAPS: 1334.0000 mg | ORAL_CAPSULE | Freq: Three times a day (TID) | ORAL | 3 refills | Status: DC

## 2019-04-17 MED ORDER — PANTOPRAZOLE SODIUM 40 MG PO TBEC: 40.0000 mg | DELAYED_RELEASE_TABLET | Freq: Every day | ORAL | 5 refills | Status: AC

## 2019-04-17 NOTE — Progress Notes (Signed)
[facility] Progress Note   54yo manadvanced CKD,dCHF, andHTN p/wincreased DOE. He speaksArabic. Creatininewas 7.91 on consult and BUN is82 and K5.7. Hefollows with [name] at [facility]. He has been on Epogen10,000 unit doseevery two weeks. He underwent left first stage basilic vein transposition on 01/04/19 and had left second stage basilic vein transposition with Dr. Carlis Abbott on 03/11/19.Note Cr 2/3 was 7.16 with BUN 67 and K 5.9, bicarb 15, PTH 444. He was started on daily lokelmaand took several doses and stopped about a week ago given his nausea.  Assessment/ Plan:   #CKD stage V with progression to end-stage disease -Status post AV fistula creation as above. With uremic symptoms which he stateshavebeen managed better here in the hospital. First HD on 2/27 using his existing L AVF. -ContactedHD SW on 3/1 to beset up withan outpatientHDunit; d/w navigator today -> unfortunately still doesn't have a spot yet.  Seen on HD today 178/92 UF 3L net 2K bath LUA AVF  Next HD Fri.   No complaints other than mild abd discomfort.  #Chronic diastolic CHF - lasix AJGOTL5/72 - Continue othercurrent regimen -Note that cardiology is ultimately planning for ischemia evaluationonce on HD  # nausea  - c/w uremia  #Hypertension -better controlled since being hospitalized  #Hyperkalemia  - temporized  #Metabolic acidosis - transitioned to HD. Discontinued sodium bicarb  #Anemiaof CKD  -He is on ESA as an outpatient-Epogen 10,000 units- last dose appearsto be2/24. Not yet due. feraheme x 1 on 2/27.  #Hyperphosphatemia and secondary hyperparathyroidism -intact PTH pending  -Continue binder -> phos down to 5.4 3/2 - Continue calcitriol. On calcitriol 0.5 mcg daily per office notes   Subjective:   Feeling better and denies CP/ dyspnea/ n/v. Wondering about a band in his left forearm. Seems very minimal.    Objective:   BP (!) 151/81   Pulse 79   Temp 97.9 F (36.6 C) (Oral)   Resp 17   Ht 5\' 9"  (1.753 m)   Wt 107 kg   SpO2 97%   BMI 34.84 kg/m   Intake/Output Summary (Last 24 hours) at 04/17/2019 1017 Last data filed at 04/16/2019 1811 Gross per 24 hour  Intake 960 ml  Output --  Net 960 ml   Weight change: -2.893 kg  Physical Exam: General adult male in bed in no acute distress Lungs clear to auscultation bilaterally normal work of breathing at rest  Heart regular rate and rhythm no rubs or gallops appreciated Abdomen softly dist obese habitus, protuberant Extremitiestraceedema; no cyanosis or clubbing Access LUE BBT bruit and thrill  Imaging: No results found.  Labs: BMET Recent Labs  Lab 04/11/19 2212 04/11/19 2212 04/12/19 0320 04/13/19 0450 04/14/19 0220 04/15/19 0238 04/16/19 0328 04/17/19 0343  NA 140  --  140 138 140 139 140 139  K 5.0  --  5.7* 5.4* 5.4* 4.9 4.8 4.3  CL 114*  --  113* 108 107 104 103 102  CO2 14*  --  15* 16* 22 22 27 25   GLUCOSE 135*  --  120* 128* 89 92 85 90  BUN 82*  --  82* 81* 55* 60* 43* 57*  CREATININE 7.98*  --  7.91* 8.56* 6.72* 7.42* 6.08* 7.14*  CALCIUM 6.8*   < > 6.8* 7.3*  7.4* 7.4* 7.8* 8.1* 8.2*  PHOS  --   --  6.9* 6.8* 5.7* 5.8* 5.4* 6.3*   < > = values in this interval not displayed.   CBC Recent Labs  Lab 04/14/19 0220 04/15/19 0238 04/16/19 0328 04/17/19 0343  WBC 5.5 5.8 5.6 5.9  HGB 7.7* 8.2* 8.7* 8.5*  HCT 25.0* 26.9* 27.5* 27.8*  MCV 88.0 88.8 87.0 87.7  PLT 198 212 201 203    Medications:    . amLODipine  5 mg Oral Daily  . calcitRIOL  0.5 mcg Oral Daily  . calcium acetate  1,334 mg Oral TID WC  . carvedilol  25 mg Oral BID WC  . feeding supplement (NEPRO CARB STEADY)  237 mL Oral BID BM  . hydrALAZINE  25 mg Oral Q8H  . pantoprazole  40 mg Oral QHS  . sodium chloride flush  3 mL Intravenous Once  . sodium chloride flush  3 mL Intravenous Q12H      Otelia Santee, MD 04/17/2019, 10:17  AM

## 2019-04-17 NOTE — Discharge Summary (Signed)
Discharge Summary  [name] MLY:[id number] DOB: [birth year]  PCP: Forrest Moron, MD  Admit date: 04/11/2019 Discharge date: 04/17/2019  Time spent: 40 mins  Recommendations for Outpatient Follow-up:  Follow up with PCP in 1 week Follow-up with cardiology as scheduled   Discharge Diagnoses:  Active Hospital Problems   Diagnosis Date Noted  . Acute exacerbation of CHF (congestive heart failure) ([facility]) 04/11/2019  . DCM (dilated cardiomyopathy) ([place])   . Intractable chronic paroxysmal hemicrania   . Acute systolic (congestive) heart failure ([place])   . Metabolic acidosis 81/27/5170  . CKD (chronic kidney disease) stage 5, GFR less than 15 ml/min (HCC) 04/11/2019  . Hyperkalemia 08/25/2018  . Uncontrolled hypertension 04/10/2010    Resolved Hospital Problems  No resolved problems to display.    Discharge Condition: Stable  Diet recommendation: Renal diet/heart healthy  Vitals:   04/17/19 1147 04/17/19 1356  BP: (!) 160/89 129/86  Pulse: 82   Resp:    Temp:    SpO2:      History of present illness:  54 year old male with solitary kidney after right nephrectomy for RCC?,  CKD-5 on list for transplant, HTN, anemia of renal disease, PUD and HLD presenting with complaints of shortness of breath, DOE, orthopnea, edema and headache.  Stated that he is on multiple medications which caused him headache. So stopped his medications. In ED, SBP in 180s.  Saturation normal on RA.  Hgb 8.2 (baseline).  K6.1.  Bicarb 15.  Cr/BUN 7.9/81 (baseline).  HS trop negative x2.  LFT within normal.  Lipase 58.  COVID-19 negative.  BNP 752.  CXR concerning for mild cardiomegaly and pulm vasc congestion with trace bilateral pleural effusions. Started on IV Lasix and oral labetalol, and admitted for volume overload due to CHF. Echo with EF of 35 to 40% (60 to 65% in 08/2018), global hypokinesis, G2 DD, moderate LAE and RAE and aortic dilation. Nephrology and cardiology consulted.  Patient started dialysis.  Cardiology planning "outpatient LHC in near future".    Today, patient denies any new complaints, still with some intermittent mild headache.  Denies any worsening shortness of breath, chest pain, abdominal pain, nausea/vomiting, fever/chills.   Hospital Course:  Principal Problem:   Acute exacerbation of CHF (congestive heart failure) (HCC) Active Problems:   Uncontrolled hypertension   Hyperkalemia   Metabolic acidosis   CKD (chronic kidney disease) stage 5, GFR less than 15 ml/min (HCC)   Acute systolic (congestive) heart failure (HCC)   DCM (dilated cardiomyopathy) (HCC)   Intractable chronic paroxysmal hemicrania   Acute combined CHF Echo with EF of 35 to 40% (60 to 65% in 08/2018), global hypokinesis, grade 2 diastolic dysfunction, moderate LAE and RAE and aortic dilation Continue Coreg, hydralazine Nephrology and cardiology consulted. Started on HD on 04/13/2019 Follow-up with cardiology as scheduled  CKD-5 progressing to nonoliguric ESRD/solitary kidney s/p right nephrectomy for RCC?/Metabolic acidosis/azotemia/hyperkalemia Followed at [facility] for potential transplant Nephrology on board and he has been started on hemodialysis starting 04/13/2019 Renal navigator scheduled outpatient HD with T/TH/S schedule  Abdominal pain/nausea/vomiting Resolved Likely due to uremia. HIDA on 2/19 normal.  RUQ ultrasound without significant finding.  Continue Protonix  Anemia of chronic disease/renal disease Baseline Hgb 8-9> 8.2 (admit)>7.9 .  Iron sats low at 12% on 2/4. IV iron and ESA per nephrology, follow-up  Hypertension Continue hydralazine 25 mg 3 times daily, amlodipine 5 mg p.o. daily, coreg  Tension headache CT head was performed which was unremarkable  Obesity, A1c 4.8%, Triglycerides  305 Encourage lifestyle change to lose weight.           Malnutrition Type:  Nutrition Problem: Inadequate oral intake Etiology: nausea, vomiting   Malnutrition  Characteristics:  Signs/Symptoms: per patient/family report   Nutrition Interventions:  Interventions: Nepro shake   Estimated body mass index is 33.83 kg/m as calculated from the following:   Height as of this encounter: 5\' 9"  (1.753 m).   Weight as of this encounter: 103.9 kg.    Procedures:  None  Consultations:  Nephrology  Cardiology  Discharge Exam: BP 129/86   Pulse 82   Temp 98.8 F (37.1 C) (Oral)   Resp 16   Ht 5\' 9"  (1.753 m)   Wt 103.9 kg   SpO2 97%   BMI 33.83 kg/m   General: NAD Cardiovascular: S1, S2 present Respiratory: CTAB   Discharge Instructions You were cared for by a hospitalist during your hospital stay. If you have any questions about your discharge medications or the care you received while you were in the hospital after you are discharged, you can call the unit and asked to speak with the hospitalist on call if the hospitalist that took care of you is not available. Once you are discharged, your primary care physician will handle any further medical issues. Please note that NO REFILLS for any discharge medications will be authorized once you are discharged, as it is imperative that you return to your primary care physician (or establish a relationship with a primary care physician if you do not have one) for your aftercare needs so that they can reassess your need for medications and monitor your lab values.  Discharge Instructions    Diet - low sodium heart healthy   Complete by: As directed    Increase activity slowly   Complete by: As directed      Allergies as of 04/17/2019      Reactions   Lokelma [sodium Zirconium Cyclosilicate] Shortness Of Breath   Beef-derived Products Other (See Comments)   Cultural preference   Pegademase Bovine Other (See Comments)   Cultural preference   Poractant Alfa Other (See Comments)   Cultural preference   Pork-derived Products Other (See Comments)   Cultural preference      Medication List      STOP taking these medications   furosemide 40 MG tablet Commonly known as: LASIX   HYDROcodone-acetaminophen 5-325 MG tablet Commonly known as: NORCO/VICODIN   labetalol 200 MG tablet Commonly known as: NORMODYNE     TAKE these medications   amLODipine 5 MG tablet Commonly known as: NORVASC Take 1 tablet (5 mg total) by mouth daily. Start taking on: April 18, 2019   calcitRIOL 0.5 MCG capsule Commonly known as: ROCALTROL Take 1 capsule (0.5 mcg total) by mouth daily. Start taking on: April 18, 2019   calcium acetate 667 MG capsule Commonly known as: PHOSLO Take 2 capsules (1,334 mg total) by mouth 3 (three) times daily with meals.   carvedilol 25 MG tablet Commonly known as: COREG Take 1 tablet (25 mg total) by mouth 2 (two) times daily with a meal.   hydrALAZINE 25 MG tablet Commonly known as: APRESOLINE Take 1 tablet (25 mg total) by mouth every 8 (eight) hours.   pantoprazole 40 MG tablet Commonly known as: PROTONIX Take 1 tablet (40 mg total) by mouth daily.      Allergies  Allergen Reactions  . Lokelma [Sodium Zirconium Cyclosilicate] Shortness Of Breath  . Beef-Derived Products Other (See Comments)  Cultural preference  . Pegademase Bovine Other (See Comments)    Cultural preference  . Poractant Alfa Other (See Comments)    Cultural preference  . Pork-Derived Products Other (See Comments)    Cultural preference   Follow-up Information    [name], MD Follow up on 05/31/2019.   Specialty: Cardiology Why: 2pm Contact information: [address] [place] [address] [phone]        Forrest Moron, MD. Schedule an appointment as soon as possible for a visit in 1 week(s).   Specialty: Internal Medicine Contact information: [place] [address] [phone]            The results of significant diagnostics from this hospitalization (including imaging, microbiology, ancillary and laboratory)  are listed below for reference.    Significant Diagnostic Studies: DG Chest 2 View  Result Date: 04/11/2019 CLINICAL DATA:  Chest pain and shortness of breath. EXAM: CHEST - 2 VIEW COMPARISON:  Chest x-ray dated November 26, 2018. FINDINGS: New mild cardiomegaly and pulmonary vascular congestion. New trace bilateral pleural effusions. No consolidation or pneumothorax. No acute osseous abnormality. IMPRESSION: New mild congestive heart failure. Electronically Signed   By: Titus Dubin M.D.   On: 04/11/2019 15:12   CT HEAD WO CONTRAST  Result Date: 04/14/2019 CLINICAL DATA:  Headache EXAM: CT HEAD WITHOUT CONTRAST TECHNIQUE: Contiguous axial images were obtained from the base of the skull through the vertex without intravenous contrast. COMPARISON:  04/05/2005 FINDINGS: Brain: No evidence of acute infarction, hemorrhage, hydrocephalus, extra-axial collection or mass lesion/mass effect. Mild periventricular white matter hypodensity and age-appropriate global volume loss slightly increased compared to prior examination. Vascular: No hyperdense vessel or unexpected calcification. Skull: Normal. Negative for fracture or focal lesion. Sinuses/Orbits: No acute finding. Other: None. IMPRESSION: 1.  No acute intracranial pathology. 2. Mild small-vessel white matter disease and age-appropriate global volume loss, slightly increased compared to remote prior examination dated 2007. Electronically Signed   By: Eddie Candle M.D.   On: 04/14/2019 15:29   NM Hepato W/EjeCT Fract  Result Date: 04/05/2019 CLINICAL DATA:  Right upper quadrant pain, nausea and bloating. EXAM: NUCLEAR MEDICINE HEPATOBILIARY IMAGING WITH GALLBLADDER EF TECHNIQUE: Sequential images of the abdomen were obtained out to 60 minutes following intravenous administration of radiopharmaceutical. After oral ingestion of 8 ounce ensure, gallbladder ejection fraction was determined. At 60 min, normal ejection fraction is greater than 33%.  RADIOPHARMACEUTICALS:  5.1 mCi Tc-31m  Choletec IV COMPARISON:  CT 03/01/2019 FINDINGS: Prompt uptake and biliary excretion of activity by the liver is seen. Gallbladder activity is visualized at 25 minutes, consistent with patency of cystic duct. Biliary activity passes into small bowel, consistent with patent common bile duct. Calculated gallbladder ejection fraction is 97%. (Normal gallbladder ejection fraction with Ensure is greater than 33%.) IMPRESSION: Normal hepatobiliary scan with normal gallbladder ejection fraction of 97%. Electronically Signed   By: Marin Olp M.D.   On: 04/05/2019 10:51   ECHOCARDIOGRAM COMPLETE  Result Date: 04/12/2019    ECHOCARDIOGRAM REPORT   Patient Name:   [name] Date of Exam: 04/12/2019 Medical Rec #:  [id number]  Height:       69.0 in Accession #:    [id number] Weight:       241.2 lb Date of Birth:  08/19/1965   BSA:          2.237 m Patient Age:    21 years   BP:  184/98 mmHg Patient Gender: M          HR:           85 bpm. Exam Location:  Inpatient Procedure: 2D Echo and Strain Analysis Indications:    CHF-Acute Diastolic 595.63 / O75.64  History:        Patient has prior history of Echocardiogram examinations, most                 recent 08/26/2018. Risk Factors:Hypertension, Diabetes and Sleep                 Apnea. Chronic kidney disease.  Sonographer:    Darlina Sicilian RDCS Referring Phys: [id number] [place]  1. Low normal GLS -14.9 EF has decreased since echo done July 2020 . Left ventricular ejection fraction, by estimation, is 35 to 40%. The left ventricle has moderately decreased function. The left ventricle demonstrates global hypokinesis. The left ventricular internal cavity size was moderately dilated. Left ventricular diastolic parameters are consistent with Grade II diastolic dysfunction (pseudonormalization). Elevated left ventricular end-diastolic pressure.  2. Right ventricular systolic function is normal. The right  ventricular size is normal. There is moderately elevated pulmonary artery systolic pressure.  3. Left atrial size was moderately dilated.  4. Right atrial size was moderately dilated.  5. The mitral valve is normal in structure and function. Mild to moderate mitral valve regurgitation. No evidence of mitral stenosis.  6. The aortic valve is normal in structure and function. Aortic valve regurgitation is not visualized. No aortic stenosis is present.  7. Aortic dilatation noted. There is mild dilatation of the aortic root measuring 40 mm.  8. The inferior vena cava is normal in size with greater than 50% respiratory variability, suggesting right atrial pressure of 3 mmHg. FINDINGS  Left Ventricle: Low normal GLS -14.9 EF has decreased since echo done July 2020. Left ventricular ejection fraction, by estimation, is 35 to 40%. The left ventricle has moderately decreased function. The left ventricle demonstrates global hypokinesis. The left ventricular internal cavity size was moderately dilated. There is no left ventricular hypertrophy. Left ventricular diastolic parameters are consistent with Grade II diastolic dysfunction (pseudonormalization). Elevated left ventricular end-diastolic pressure. Right Ventricle: The right ventricular size is normal. No increase in right ventricular wall thickness. Right ventricular systolic function is normal. There is moderately elevated pulmonary artery systolic pressure. The tricuspid regurgitant velocity is 3.39 m/s, and with an assumed right atrial pressure of 8 mmHg, the estimated right ventricular systolic pressure is 84.1 mmHg. Left Atrium: Left atrial size was moderately dilated. Right Atrium: Right atrial size was moderately dilated. Pericardium: A small pericardial effusion is present. The pericardial effusion is posterior to the left ventricle. Mitral Valve: The mitral valve is normal in structure and function. There is mild thickening of the mitral valve leaflet(s). There  is mild calcification of the mitral valve leaflet(s). Normal mobility of the mitral valve leaflets. Mild to moderate mitral  valve regurgitation. No evidence of mitral valve stenosis. Tricuspid Valve: The tricuspid valve is normal in structure. Tricuspid valve regurgitation is mild . No evidence of tricuspid stenosis. Aortic Valve: The aortic valve is normal in structure and function. Aortic valve regurgitation is not visualized. No aortic stenosis is present. Pulmonic Valve: The pulmonic valve was normal in structure. Pulmonic valve regurgitation is trivial. No evidence of pulmonic stenosis. Aorta: The aortic root is normal in size and structure and aortic dilatation noted. There is mild dilatation of the aortic root measuring 40 mm.  Venous: The inferior vena cava is normal in size with greater than 50% respiratory variability, suggesting right atrial pressure of 3 mmHg. IAS/Shunts: No atrial level shunt detected by color flow Doppler.  LEFT VENTRICLE PLAX 2D LVIDd:         5.60 cm      Diastology LVIDs:         4.02 cm      LV e' lateral:   7.62 cm/s LV PW:         1.06 cm      LV E/e' lateral: 16.6 LV IVS:        0.98 cm      LV e' medial:    6.09 cm/s LVOT diam:     2.10 cm      LV E/e' medial:  20.8 LV SV:         77 LV SV Index:   34 LVOT Area:     3.46 cm  LV Volumes (MOD) LV vol d, MOD A2C: 151.5 ml LV vol d, MOD A4C: 187.5 ml LV vol s, MOD A2C: 96.7 ml LV vol s, MOD A4C: 94.0 ml LV SV MOD A2C:     54.8 ml LV SV MOD A4C:     187.5 ml LV SV MOD BP:      79.9 ml RIGHT VENTRICLE RV S prime:     11.90 cm/s TAPSE (M-mode): 2.3 cm LEFT ATRIUM              Index       RIGHT ATRIUM           Index LA diam:        4.60 cm  2.06 cm/m  RA Area:     24.60 cm LA Vol (A2C):   126.0 ml 56.32 ml/m RA Volume:   79.50 ml  35.54 ml/m LA Vol (A4C):   93.6 ml  41.84 ml/m LA Biplane Vol: 110.0 ml 49.17 ml/m  AORTIC VALVE LVOT Vmax:   119.00 cm/s LVOT Vmean:  81.900 cm/s LVOT VTI:    0.221 m  AORTA Ao Root diam: 3.60 cm Ao  Asc diam:  4.00 cm MITRAL VALVE                 TRICUSPID VALVE MV Area (PHT): 6.05 cm      TR Peak grad:   46.0 mmHg MV Decel Time: 125 msec      TR Vmax:        339.00 cm/s MR Peak grad:    144.5 mmHg MR Mean grad:    95.0 mmHg   SHUNTS MR Vmax:         601.00 cm/s Systemic VTI:  0.22 m MR Vmean:        460.0 cm/s  Systemic Diam: 2.10 cm MR PISA:         0.25 cm MR PISA Eff ROA: 2 mm MR PISA Radius:  0.20 cm MV E velocity: 126.67 cm/s MV A velocity: 60.20 cm/s MV E/A ratio:  2.10 Jenkins Rouge MD Electronically signed by Jenkins Rouge MD Signature Date/Time: 04/12/2019/9:52:33 AM    Final    US Abdomen Limited RUQ  Result Date: 04/12/2019 CLINICAL DATA:  Abdominal pain EXAM: ULTRASOUND ABDOMEN LIMITED RIGHT UPPER QUADRANT COMPARISON:  CT abdomen pelvis 03/01/2019 FINDINGS: Gallbladder: No gallstones or wall thickening visualized. No sonographic Murphy sign noted by sonographer. Common bile duct: Diameter: 2.9 mm Liver: Mild increased echogenicity liver without focal liver lesion. Portal vein is patent  on color Doppler imaging with normal direction of blood flow towards the liver. Other: None. IMPRESSION: Negative for gallstones or biliary dilatation. Mildly echogenic liver. Electronically Signed   By: Franchot Gallo M.D.   On: 04/12/2019 11:32    Microbiology: Recent Results (from the past 240 hour(s))  SARS CORONAVIRUS 2 (TAT 6-24 HRS) Nasopharyngeal Nasopharyngeal Swab     Status: None   Collection Time: 04/11/19  9:01 PM   Specimen: Nasopharyngeal Swab  Result Value Ref Range Status   SARS Coronavirus 2 NEGATIVE NEGATIVE Final    Comment: (NOTE) SARS-CoV-2 target nucleic acids are NOT DETECTED. The SARS-CoV-2 RNA is generally detectable in upper and lower respiratory specimens during the acute phase of infection. Negative results do not preclude SARS-CoV-2 infection, do not rule out co-infections with other pathogens, and should not be used as the sole basis for treatment or other patient  management decisions. Negative results must be combined with clinical observations, patient history, and epidemiological information. The expected result is Negative. Fact Sheet for Patients: SugarRoll.be Fact Sheet for Healthcare Providers: https://www.woods-mathews.com/ This test is not yet approved or cleared by the [place] FDA and  has been authorized for detection and/or diagnosis of SARS-CoV-2 by FDA under an Emergency Use Authorization (EUA). This EUA will remain  in effect (meaning this test can be used) for the duration of the COVID-19 declaration under Section 56 4(b)(1) of the Act, 21 U.S.C. section 360bbb-3(b)(1), unless the authorization is terminated or revoked sooner. Performed at [facility], [place] [address] [place], [place] [address]      Labs: Basic Metabolic Panel: Recent Labs  Lab 04/13/19 0450 04/14/19 0220 04/15/19 0238 04/16/19 0328 04/17/19 0343  NA 138 140 139 140 139  K 5.4* 5.4* 4.9 4.8 4.3  CL 108 107 104 103 102  CO2 16* 22 22 27 25   GLUCOSE 128* 89 92 85 90  BUN 81* 55* 60* 43* 57*  CREATININE 8.56* 6.72* 7.42* 6.08* 7.14*  CALCIUM 7.3*  7.4* 7.4* 7.8* 8.1* 8.2*  MG 1.6* 1.5* 2.0 1.7 1.9  PHOS 6.8* 5.7* 5.8* 5.4* 6.3*   Liver Function Tests: Recent Labs  Lab 04/11/19 1700 04/12/19 0320 04/13/19 0450 04/14/19 0220 04/15/19 0238 04/16/19 0328 04/17/19 0343  AST 12*  --   --   --   --   --   --   ALT 18  --   --   --   --   --   --   ALKPHOS 72  --   --   --   --   --   --   BILITOT 0.6  --   --   --   --   --   --   PROT 6.2*  --   --   --   --   --   --   ALBUMIN 3.4*   < > 3.1* 2.9* 3.1* 3.0* 3.0*   < > = values in this interval not displayed.   Recent Labs  Lab 04/11/19 1700  LIPASE 58*   No results for input(s): AMMONIA in the last 168 hours. CBC: Recent Labs  Lab 04/13/19 0450 04/14/19 0220 04/15/19 0238 04/16/19 0328 04/17/19 0343  WBC 6.2 5.5 5.8 5.6 5.9    HGB 7.9* 7.7* 8.2* 8.7* 8.5*  HCT 25.4* 25.0* 26.9* 27.5* 27.8*  MCV 87.6 88.0 88.8 87.0 87.7  PLT 217 198 212 201 203   Cardiac Enzymes: No results for input(s): CKTOTAL, CKMB, CKMBINDEX,  TROPONINI in the last 168 hours. BNP: BNP (last 3 results) Recent Labs    08/25/18 1403 04/11/19 1537  BNP 251.6* 752.1*    ProBNP (last 3 results) No results for input(s): PROBNP in the last 8760 hours.  CBG: Recent Labs  Lab 04/13/19 2221 04/14/19 2101 04/15/19 0733 04/15/19 1701  GLUCAP 87 120* 85 128*       Signed:  Alma Friendly, MD [facility] 04/17/2019, 2:36 PM

## 2019-04-17 NOTE — Care Management (Signed)
04-17-19 1411 Case Manager spoke with patient regarding any home health needs. Prior to arrival patient was from home with spouse and the plan will be to return home without any home health services. Patient states he will initially have someone drive him to hemodialysis once he starts. Patient gets medications without any problems from [facility] on [address] and has a primary care provider. Case Manager did Building surveyor to go over renal diet with patient- he has questions regarding foods to eat once he gets home. No further home needs identified at this time. Graves-Bigelow, Ocie Cornfield, RN BSN Case Manager

## 2019-04-17 NOTE — Discharge Instructions (Signed)
Low Sodium Nutrition Therapy  Eating less sodium can help you if you have high blood pressure, heart failure, or kidney or liver disease.   Your body needs a little sodium, but too much sodium can cause your body to hold onto extra water. This extra water will raise your blood pressure and can cause damage to your heart, kidneys, or liver as they are forced to work harder.   Sometimes you can see how the extra fluid affects you because your hands, legs, or belly swell. You may also hold water around your heart and lungs, which makes it hard to breathe.   Even if you take medication for blood pressure or a water pill (diuretic) to remove fluid, it is still important to have less salt in your diet.   Check with your primary care provider before drinking alcohol since it may affect the amount of fluid in your body and how your heart, kidneys, or liver work. Sodium in Food A low-sodium meal plan limits the sodium that you get from food and beverages to 1,500-2,000 milligrams (mg) per day. Salt is the main source of sodium. Read the nutrition label on the package to find out how much sodium is in one serving of a food.  . Select foods with 140 milligrams (mg) of sodium or less per serving.  . You may be able to eat one or two servings of foods with a little more than 140 milligrams (mg) of sodium if you are closely watching how much sodium you eat in a day.  . Check the serving size on the label. The amount of sodium listed on the label shows the amount in one serving of the food. So, if you eat more than one serving, you will get more sodium than the amount listed.  Tips Cutting Back on Sodium . Eat more fresh foods.  . Fresh fruits and vegetables are low in sodium, as well as frozen vegetables and fruits that have no added juices or sauces.  . Fresh meats are lower in sodium than processed meats, such as bacon, sausage, and hotdogs.  . Not all processed foods are unhealthy, but some  processed foods may have too much sodium.  . Eat less salt at the table and when cooking. One of the ingredients in salt is sodium.  . One teaspoon of table salt has 2,300 milligrams of sodium.  . Leave the salt out of recipes for pasta, casseroles, and soups. . Be a smart shopper.  . Food packages that say "Salt-free", sodium-free", "very low sodium," and "low sodium" have less than 140 milligrams of sodium per serving.  . Beware of products identified as "Unsalted," "No Salt Added," "Reduced Sodium," or "Lower Sodium." These items may still be high in sodium. You should always check the nutrition label. . Add flavors to your food without adding sodium.  . Try lemon juice, lime juice, or vinegar.  . Dry or fresh herbs add flavor.  Sharyn Lull a sodium-free seasoning blend or make your own at home. . You can purchase salt-free or sodium-free condiments like barbeque sauce in stores and online. Ask your registered dietitian nutritionist for recommendations and where to find them.  .  Eating in Restaurants . Choose foods carefully when you eat outside your home. Restaurant foods can be very high in sodium. Many restaurants provide nutrition facts on their menus or their websites. If you cannot find that information, ask your server. Let your server know that  you want your food to be cooked without salt and that you would like your salad dressing and sauces to be served on the side.    . Foods Not Recommended . Food Group . Foods Not Recommended  . Grains . Breads or crackers topped with salt Cereals (hot/cold) with more than 300 mg sodium per serving Biscuits, cornbread, and other "quick" breads prepared with baking soda Pre-packaged bread crumbs Seasoned and packaged rice and pasta mixes Self-rising flours  . Protein Foods . Cured meats: Bacon, ham, sausage, pepperoni and hot dogs Canned meats (chili, vienna sausage, or sardines) Smoked fish and meats Frozen meals that have more than 600 mg of  sodium per serving Egg substitute (with added sodium)  . Dairy . Buttermilk Processed cheese spreads Cottage cheese (1 cup may have over 500 mg of sodium; look for low-sodium.) American or feta cheese Shredded Cheese has more sodium than blocks of cheese String cheese  . Vegetables . Canned vegetables (unless they are salt-free, sodium-free or low sodium) Frozen vegetables with seasoning and sauces Sauerkraut and pickled vegetables Canned or dried soups (unless they are salt-free, sodium-free, or low sodium) [place] fries and onion rings  . Fruit . Dried fruits preserved with additives that have sodium  . Oils . Salted butter or margarine, all types of olives  . Condiments . Salt, sea salt, kosher salt, onion salt, and garlic salt Seasoning mixes with salt Bouillon cubes Ketchup Barbeque sauce and Worcestershire sauce unless low sodium Soy sauce Salsa, pickles, olives, relish Salad dressings: ranch, blue cheese, [place], and [place].    . Sodium Free Flavoring Tips .  Marland Kitchen When cooking, the following items may be used for flavoring instead of salt or seasonings that contain sodium. . Remember: A little bit of spice goes a long way! Be careful not to overseason. Marland Kitchen Spice Blend Recipe (makes about ? cup) . 5 teaspoons onion powder  . 2 teaspoons garlic powder  . 2 teaspoons paprika  . 2 teaspoon dry mustard  . 1 teaspoon crushed thyme leaves  .  teaspoon white pepper  .  teaspoon celery seed Food Item Flavorings  Beef Basil, bay leaf, caraway, curry, dill, dry mustard, garlic, grape jelly, green pepper, mace, marjoram, mushrooms (fresh), nutmeg, onion or onion powder, parsley, pepper, rosemary, sage  Chicken Basil, cloves, cranberries, mace, mushrooms (fresh), nutmeg, oregano, paprika, parsley, pineapple, saffron, sage, savory, tarragon, thyme, tomato, turmeric  Egg Chervil, curry, dill, dry mustard, garlic or garlic powder, green pepper, jelly, mushrooms (fresh), nutmeg, onion  powder, paprika, parsley, rosemary, tarragon, tomato  Fish Basil, bay leaf, chervil, curry, dill, dry mustard, green pepper, lemon juice, marjoram, mushrooms (fresh), paprika, pepper, tarragon, tomato, turmeric  Lamb Cloves, curry, dill, garlic or garlic powder, mace, mint, mint jelly, onion, oregano, parsley, pineapple, rosemary, tarragon, thyme  Pork Applesauce, basil, caraway, chives, cloves, garlic or garlic powder, onion or onion powder, rosemary, thyme  Veal Apricots, basil, bay leaf, currant jelly, curry, ginger, marjoram, mushrooms (fresh), oregano, paprika  Vegetables Basil, dill, garlic or garlic powder, ginger, lemon juice, mace, marjoram, nutmeg, onion or onion powder, tarragon, tomato, sugar or sugar substitute, salt-free salad dressing, vinegar  Desserts Allspice, anise, cinnamon, cloves, ginger, mace, nutmeg, vanilla extract, other extracts   Copyright 2020  Academy of [employer] Dietetics. All rights reserved  Fluid Restricted Nutrition Therapy  You have been prescribed this diet because your condition affects how much fluid you can eat or drink. If your heart, liver, or kidneys aren't working properly,  you may not be able to effectively eliminate fluids from the body and this may cause swelling (edema) in the legs, arms, and/or stomach. Drink no more than _____1200____ liters or ___40_____ ounces or ____5____cups of fluid per day.  . You don't need to stop eating or drinking the same fluids you normally would, but you may need to eat or drink less than usual.  . Your registered dietitian nutritionist will help you determine the correct amount of fluid to consume during the day Breakfast Include fluids taken with medications  Lunch Include fluids taken with medications  Dinner Include fluids taken with medications  Bedtime Snack Include fluids taken with medications     Tips What Are Fluids?  A fluid is anything that is liquid or anything that would melt if left at room  temperature. You will need to count these foods and liquids--including any liquid used to take medication--as part of your daily fluid intake. Some examples are: . Alcohol (drink only with your doctor's permission)  . Coffee, tea, and other hot beverages  . Gelatin (Jell-O)  . Gravy  . Ice cream, sherbet, sorbet  . Ice cubes, ice chips  . Milk, liquid creamer  . Nutritional supplements  . Popsicles  . Vegetable and fruit juices; fluid in canned fruit  . Watermelon  . Yogurt  . Soft drinks, lemonade, limeade  . Soups  . Syrup How Do I Measure My Fluid Intake? Marland Kitchen Record your fluid intake daily.  . Tip: Every day, each time you eat or drink fluids, pour water in the same amount into an empty container that can hold the same amount of fluids you are allowed daily. This may help you keep track of how much fluid you are taking in throughout the day.  . To accurately keep track of how much liquid you take in, measure the size of the cups, glasses, and bowls you use. If you eat soup, measure how much of it is liquid and how much is solid (such as noodles, vegetables, meat). Conversions for Measuring Fluid Intake  Milliliters (mL) Liters (L) Ounces (oz) Cups (c)  1000 1 32 4  1200 1.2 40 5  1500 1.5 50 6 1/4  1800 1.8 60 7 1/2  2000 2 67 8 1/3  Tips to Reduce Your Thirst . Chew gum or suck on hard candy.  . Rinse or gargle with mouthwash. Do not swallow.  . Ice chips or popsicles my help quench thirst, but this too needs to be calculated into the total restriction. Melt ice chips or cubes first to figure out how much fluid they produce (for example, experiment with melting  cup ice chips or 2 ice cubes).  . Add a lemon wedge to your water.  . Limit how much salt you take in. A high salt intake might make you thirstier.  . Don't eat or drink all your allowed liquids at once. Space your liquids out through the day.  . Use small glasses and cups and sip slowly. If allowed, take your medications  with fluids you eat or drink during a meal.   Corrin Parker, MS, RD, LDN Clinical Dietitian Office Phone [phone]    Eating Plan for Dialysis Dialysis is a treatment that cleans your blood. It is used when your kidneys are damaged. When you need dialysis, you should watch what you eat. This is because some nutrients can build up in your blood between treatments and make you sick. Your doctor or diet specialist (  dietitian) will:  Tell you what nutrients you should include or avoid.  Tell you how much of these nutrients you should get each day.  Help you plan meals.  Tell you how much to drink each day. What are tips for following this plan? Reading food labels  Check food labels for: ? Potassium. This is found in milk, fruits, and vegetables. ? Phosphorus. This is found in milk, cheese, beans, nuts, and carbonated beverages. ? Salt (sodium). This is in processed meats, cured meats, ready-made frozen meals, canned vegetables, and salty snack foods.  Try to find foods that are low in potassium, phosphorus, and sodium.  Look for foods that are labeled "sodium free," "reduced sodium," or "low sodium." Shopping  Do not buy whole-grain and high-fiber foods.  Do not buy or use salt substitutes.  Do not buy processed foods. Cooking  Drain all fluid from cooked vegetables and canned fruits before you eat them.  Before you cook potatoes, cut them into small pieces. Then boil them in unsalted water.  Try using herbs and spices that do not contain sodium to add flavor. Meal planning Most people on dialysis should try to eat:  6-11 servings of grains each day. One serving is equal to 1 slice of bread or  cup of cooked rice or pasta.  2-3 servings of low-potassium vegetables each day. One serving is equal to  cup.  2-3 servings of low-potassium fruits each day. One serving is equal to  cup.  Protein, such as meat, poultry, fish, and eggs. Talk with your doctor or  dietitian about the right amount and type of protein to eat.   cup of dairy each day. General information  Follow your doctor's instructions about how much to drink. You may be told to: ? Write down what you drink. ? Write down the foods you eat that are made mostly from water, such as gelatin and soups. ? Drink from small cups.  Take vitamin and mineral supplements only as told by your doctor.  Take over-the-counter and prescription medicines only as told by your doctor. What foods can I eat?     Fruits Apples. Fresh or frozen berries. Fresh or canned pears, peaches, and pineapple. Grapes. Plums. Vegetables Fresh or frozen broccoli, carrots, and green beans. Cabbage. Cauliflower. Celery. Cucumbers. Eggplant. Radishes. Zucchini. Grains White bread. White rice. Cooked cereal. Unsalted popcorn. Tortillas. Pasta. Meats and other proteins Fresh or frozen beef, pork, chicken, and fish. Eggs. Dairy Cream cheese. Heavy cream. Ricotta cheese. Beverages Apple cider. Cranberry juice. Grape juice. Lemonade. Black coffee. Rice milk (that is not enriched or fortified). Seasonings and condiments Herbs. Spices. Jam and jelly. Honey. Sweets and desserts Sherbet. Cakes. Cookies. Fats and oils Olive oil, canola oil, and safflower oil. Other foods Non-dairy creamer. Non-dairy whipped topping. Homemade broth without salt. The items listed above may not be a complete list of foods and beverages you can eat. Contact your dietitian for more options. What foods should I avoid? Fruits Star fruit. Bananas. Oranges. Kiwi. Nectarines. Prunes. Melon. Dried fruit. Avocado. Vegetables Potatoes. Beets. Tomatoes. Winter squash and pumpkin. Asparagus. Spinach. Parsnips. Grains Whole-grain bread. Whole-grain pasta. High-fiber cereal. Meats and other proteins Canned, smoked, and cured meats. Packaged lunch meat. Sardines. Nuts and seeds. Peanut butter. Beans and legumes. Dairy Milk. Buttermilk. Yogurt.  Cheese and cottage cheese. Processed cheese spreads. Beverages Orange juice. Prune juice. Carbonated soft drinks. Seasonings and condiments Salt. Salt substitutes. Soy sauce. Sweets and desserts Ice cream. Chocolate. Candied nuts. Fats and  oils Butter. Margarine. Other foods Ready-made frozen meals. Canned soups. The items listed above may not be a complete list of foods and beverages you should avoid. Contact your dietitian for more information. Summary  If you are having dialysis, it is important to watch what you eat. Certain nutrients and wastes can build up in your blood and cause you to get sick.  Your dietitian will help you make an eating plan that meets your needs.  Avoid foods that are high in potassium, salt (sodium), and phosphorus. Restrict fluids as told by your doctor or dietitian. This information is not intended to replace advice given to you by your health care provider. Make sure you discuss any questions you have with your health care provider. Document Revised: 04/19/2017 Document Reviewed: 02/01/2017 Elsevier Patient Education  [place].

## 2019-04-17 NOTE — Progress Notes (Signed)
Patient has been accepted at [facility] clinic for OP HD treatment on a TTS schedule with a seat time 12:50pm. He needs to arrive 20 minutes early to his appointments. We have already discussed how imperative it is to attend all schedule HD treatments.  On his first day at the clinic, Thursday, 04/18/19, patient needs to arrive at 12:00pm to complete intake paperwork prior to first treatment. Nephrologist and patient informed.  Renal Navigator confirmed with patient that he has family to transport him to his first appointment. [facility] application followed up on and Coordinator states he will be pre-certified today. Renal Navigator informed patient that he should be able to start utilizing services by Friday for treatment Saturday if needed. Patient is pleasant, agreeable and appreciative. Patient is cleared for discharge from an OP HD standpoint.   Alphonzo Cruise, [place] Renal Navigator [phone]

## 2019-04-18 ENCOUNTER — Telehealth: Payer: Self-pay | Admitting: Physician Assistant

## 2019-04-18 NOTE — Telephone Encounter (Signed)
Transition of care from inpatient facility  Date of discharge: 04/17/19 Date of contact: 04/18/19 Method of contact: Phone  Patient contacted to discuss transition of care. Reports he had already seen our providers at the HD unit. Confirmed TCM visit completed today.  Anice Paganini, PA-C 04/18/2019, 3:35 PM  [facility] Pager: [phone]

## 2019-04-22 DIAGNOSIS — Z23 Encounter for immunization: Secondary | ICD-10-CM | POA: Insufficient documentation

## 2019-04-23 ENCOUNTER — Encounter: Payer: PRIVATE HEALTH INSURANCE | Admitting: Vascular Surgery

## 2019-04-23 ENCOUNTER — Ambulatory Visit (HOSPITAL_COMMUNITY): Payer: PRIVATE HEALTH INSURANCE

## 2019-04-25 DIAGNOSIS — D509 Iron deficiency anemia, unspecified: Secondary | ICD-10-CM | POA: Insufficient documentation

## 2019-04-26 ENCOUNTER — Other Ambulatory Visit: Payer: Self-pay

## 2019-04-26 ENCOUNTER — Telehealth: Payer: Self-pay | Admitting: Family Medicine

## 2019-04-26 ENCOUNTER — Ambulatory Visit (INDEPENDENT_AMBULATORY_CARE_PROVIDER_SITE_OTHER): Payer: 59 | Admitting: Family Medicine

## 2019-04-26 VITALS — BP 116/70 | HR 85 | Temp 98.2°F | Resp 16 | Ht 69.0 in | Wt 232.0 lb

## 2019-04-26 DIAGNOSIS — D631 Anemia in chronic kidney disease: Secondary | ICD-10-CM | POA: Diagnosis not present

## 2019-04-26 DIAGNOSIS — I5032 Chronic diastolic (congestive) heart failure: Secondary | ICD-10-CM

## 2019-04-26 DIAGNOSIS — Z09 Encounter for follow-up examination after completed treatment for conditions other than malignant neoplasm: Secondary | ICD-10-CM

## 2019-04-26 DIAGNOSIS — N186 End stage renal disease: Secondary | ICD-10-CM | POA: Diagnosis not present

## 2019-04-26 DIAGNOSIS — Z992 Dependence on renal dialysis: Secondary | ICD-10-CM

## 2019-04-26 NOTE — Progress Notes (Signed)
Established Patient Office Visit  Subjective:  Patient ID: [name], male    DOB: 06-05-1965  Age: 54 y.o. MRN: [id number]  CC:  Chief Complaint  Patient presents with  . Hypertension    pt was seen in ED for hypertension and chest pain, feeling better on new medications.     HPI [name] presents for   History of present illness:  54 year old male with solitary kidney after right nephrectomy for RCC?, CKD-5 on list for transplant, HTN, anemia of renal disease, PUD and HLD presenting with complaints of shortness of breath, DOE, orthopnea, edema and headache. Stated that he is on multiple medications which caused him headache. So stopped his medications. In ED, SBP in 180s. Saturation normal on RA. Hgb 8.2 (baseline). K6.1. Bicarb 15. Cr/BUN 7.9/81 (baseline). HS trop negative x2. LFT within normal. Lipase 58. COVID-19 negative. BNP 752. CXR concerning for mild cardiomegaly and pulm vasc congestion with trace bilateral pleural effusions. Started on IV Lasix and oral labetalol, and admitted for volume overload due to CHF. Echo with EF of 35 to 40% (60 to 65% in 08/2018), global hypokinesis, G2 DD, moderate LAE and RAE and aortic dilation. Nephrology and cardiology consulted. Patient started dialysis. Cardiology planning "outpatient LHC in near future".  He was admitted in the hopsital for heart failure and was ruled out for ACS He was having shortness of breath He also has chronic headaches but reports that his sugars were low and he headaches He states that now his headaches are better He reports that he had worsening headache when he was getting IV meds in the hospital.  He reports that he intermittently gets sharp pains in his ankle  He is getting dialysis which he is getting dialysis Sat, Tuesday, Thursday He reports that he has been losing weight with dialysis  Wt Readings from Last 3 Encounters:  04/26/19 232 lb (105.2 kg)  04/17/19 229 lb 0.9 oz (103.9 kg)    03/28/19 244 lb (110.7 kg)   He has a history of shortness of breath but he states that he is doing okay.    Past Medical History:  Diagnosis Date  . Anemia   . Back pain   . CKD (chronic kidney disease), stage III    Stage 4  . Colon polyps    adenomatous  . Elevated cholesterol   . History of echocardiogram    Echo 11/17: EF 65-70, normal wall motion, grade 1 diastolic dysfunction, trivial MR, mild LAE, mild TR  . Hyperlipidemia   . Hypertension    no current bp meds for last 3 months  . Kidney stones 2007  . Medication intolerance    a. multiple with prior nonadherence to regimen.  . Nausea    " in the morning since having the kidney problem "  . Otosclerosis of both ears   . Pneumonia   . PONV (postoperative nausea and vomiting)   . PUD (peptic ulcer disease)    Has had unspecified surgery for this  . Sleep apnea     Past Surgical History:  Procedure Laterality Date  . AV FISTULA PLACEMENT Left 01/04/2019   Procedure: ARTERIOVENOUS (AV) FISTULA CREATION LEFT ARM;  Surgeon: [name], MD;  Location: [place];  Service: Vascular;  Laterality: Left;  . BASCILIC VEIN TRANSPOSITION Left 03/11/2019   Procedure: BASILIC VEIN TRANSPOSITION 2ND STAGE LEFT;  Surgeon: [name], MD;  Location: [place];  Service: Vascular;  Laterality: Left;  . NEPHRECTOMY  02/18/2011  Procedure: NEPHRECTOMY;  Surgeon: Hanley Ben, MD;  Location: [facility];  Service: Urology;  Laterality: Right;  . SMALL INTESTINE SURGERY    . STAPEDOTOMY  2005   lt ear jan, right ear sept  . surgery for ulcers  1990    Family History  Problem Relation Age of Onset  . Hyperlipidemia Father   . Heart disease Mother   . Hypertension Mother   . Hypertension Sister   . Heart disease Brother 78       CABG  . Hyperlipidemia Sister   . Hyperlipidemia Sister   . Esophageal cancer Cousin   . Healthy Child   . Colon cancer Neg Hx   . Stomach cancer Neg Hx   . Rectal cancer Neg Hx      Social History   Socioeconomic History  . Marital status: Married    Spouse name: Edgardo Roys  . Number of children: 5  . Years of education: Not on file  . Highest education level: 12th grade  Occupational History    Employer: [employer]  Tobacco Use  . Smoking status: Never Smoker  . Smokeless tobacco: Never Used  Substance and Sexual Activity  . Alcohol use: No  . Drug use: No  . Sexual activity: Yes    Birth control/protection: None  Other Topics Concern  . Not on file  Social History Narrative   Lives at home with wife and family. He is from [place]. Came to the [place] in 2002.      Patient is right-handed. He lives in a one level home. He drinks 1-2 cups of coffee and tea a day. He does not exercise.   Social Determinants of Health   Financial Resource Strain: Low Risk   . Difficulty of Paying Living Expenses: Not hard at all  Food Insecurity: No Food Insecurity  . Worried About Charity fundraiser in the Last Year: Never true  . Ran Out of Food in the Last Year: Never true  Transportation Needs: No Transportation Needs  . Lack of Transportation (Medical): No  . Lack of Transportation (Non-Medical): No  Physical Activity: Inactive  . Days of Exercise per Week: 0 days  . Minutes of Exercise per Session: 0 min  Stress: No Stress Concern Present  . Feeling of Stress : Not at all  Social Connections: Slightly Isolated  . Frequency of Communication with Friends and Family: More than three times a week  . Frequency of Social Gatherings with Friends and Family: More than three times a week  . Attends Religious Services: More than 4 times per year  . Active Member of Clubs or Organizations: No  . Attends Archivist Meetings: Never  . Marital Status: Married  Human resources officer Violence: Not At Risk  . Fear of Current or Ex-Partner: No  . Emotionally Abused: No  . Physically Abused: No  . Sexually Abused: No    Outpatient Medications Prior to Visit   Medication Sig Dispense Refill  . amLODipine (NORVASC) 5 MG tablet Take 1 tablet (5 mg total) by mouth daily. 30 tablet 0  . calcitRIOL (ROCALTROL) 0.5 MCG capsule Take 1 capsule (0.5 mcg total) by mouth daily. 30 capsule 0  . calcium acetate (PHOSLO) 667 MG capsule Take 2 capsules (1,334 mg total) by mouth 3 (three) times daily with meals. 180 capsule 3  . carvedilol (COREG) 25 MG tablet Take 1 tablet (25 mg total) by mouth 2 (two) times daily with a meal. 60 tablet 0  .  hydrALAZINE (APRESOLINE) 25 MG tablet Take 1 tablet (25 mg total) by mouth every 8 (eight) hours. 90 tablet 0  . pantoprazole (PROTONIX) 40 MG tablet Take 1 tablet (40 mg total) by mouth daily. 30 tablet 5   No facility-administered medications prior to visit.    Allergies  Allergen Reactions  . Lokelma [Sodium Zirconium Cyclosilicate] Shortness Of Breath  . Beef-Derived Products Other (See Comments)    Cultural preference  . Pegademase Bovine Other (See Comments)    Cultural preference  . Poractant Alfa Other (See Comments)    Cultural preference  . Pork-Derived Products Other (See Comments)    Cultural preference    ROS Review of Systems See hpi   Objective:    Physical Exam  BP 116/70   Pulse 85   Temp 98.2 F (36.8 C) (Temporal)   Resp 16   Ht 5\' 9"  (1.753 m)   Wt 232 lb (105.2 kg)   SpO2 98%   BMI 34.26 kg/m  Wt Readings from Last 3 Encounters:  04/26/19 232 lb (105.2 kg)  04/17/19 229 lb 0.9 oz (103.9 kg)  03/28/19 244 lb (110.7 kg)   Physical Exam  Constitutional: Oriented to person, place, and time. Appears well-developed and well-nourished.  HENT:  Head: Normocephalic and atraumatic.  Eyes: Conjunctivae and EOM are normal.  Cardiovascular: Normal rate, regular rhythm, normal heart sounds and intact distal pulses.  No murmur heard. Pulmonary/Chest: Effort normal and breath sounds normal. No stridor. No respiratory distress. Has no wheezes.  Neurological: Is alert and oriented to  person, place, and time.  Skin: Skin is warm. Capillary refill takes less than 2 seconds.  Psychiatric: Has a normal mood and affect. Behavior is normal. Judgment and thought content normal.    There are no preventive care reminders to display for this patient.  There are no preventive care reminders to display for this patient.  Lab Results  Component Value Date   TSH 1.125 08/25/2018   Lab Results  Component Value Date   WBC 5.9 04/17/2019   HGB 8.5 (L) 04/17/2019   HCT 27.8 (L) 04/17/2019   MCV 87.7 04/17/2019   PLT 203 04/17/2019   Lab Results  Component Value Date   NA 139 04/17/2019   K 4.3 04/17/2019   CO2 25 04/17/2019   GLUCOSE 90 04/17/2019   BUN 57 (H) 04/17/2019   CREATININE 7.14 (H) 04/17/2019   BILITOT 0.6 04/11/2019   ALKPHOS 72 04/11/2019   AST 12 (L) 04/11/2019   ALT 18 04/11/2019   PROT 6.2 (L) 04/11/2019   ALBUMIN 3.0 (L) 04/17/2019   CALCIUM 8.2 (L) 04/17/2019   ANIONGAP 12 04/17/2019   GFR 54.07 (L) 05/06/2014   Lab Results  Component Value Date   CHOL 179 04/13/2019   Lab Results  Component Value Date   HDL 21 (L) 04/13/2019   Lab Results  Component Value Date   LDLCALC 97 04/13/2019   Lab Results  Component Value Date   TRIG 305 (H) 04/13/2019   Lab Results  Component Value Date   CHOLHDL 8.5 04/13/2019   Lab Results  Component Value Date   HGBA1C 4.8 04/13/2019      Assessment & Plan:   Problem List Items Addressed This Visit    None    Visit Diagnoses    Hospital discharge follow-up    -  Primary   Anemia in chronic kidney disease, on chronic dialysis (HCC)       Chronic diastolic CHF (congestive  heart failure) [facility])        Reviewed hospital course Pt now getting dialysis  Discussed sharing labs so that he does not have to get extra venipunctures Continue his meds as instructed by neprhology  No orders of the defined types were placed in this encounter.   Follow-up: No follow-ups on file.    Forrest Moron, MD

## 2019-04-26 NOTE — Patient Instructions (Addendum)
  Please contact [facility] for an list of physician who are AUTHORIZED EXAMINATION FOR DISABILITY PHYSICAL     If you have lab work done today you will be contacted with your lab results within the next 2 weeks.  If you have not heard from [place] then please contact us. The fastest way to get your results is to register for My Chart.   IF you received an x-ray today, you will receive an invoice from [facility]. Please contact [facility] at [phone] with questions or concerns regarding your invoice.   IF you received labwork today, you will receive an invoice from [facility]. Please contact [facility] at [phone] with questions or concerns regarding your invoice.   Our billing staff will not be able to assist you with questions regarding bills from these companies.  You will be contacted with the lab results as soon as they are available. The fastest way to get your results is to activate your My Chart account. Instructions are located on the last page of this paperwork. If you have not heard from [place] regarding the results in 2 weeks, please contact this office.

## 2019-04-26 NOTE — Telephone Encounter (Signed)
Disability forms have been received and have been placed in the providers lounge (red box)

## 2019-05-03 ENCOUNTER — Other Ambulatory Visit: Payer: Self-pay | Admitting: Nephrology

## 2019-05-03 ENCOUNTER — Ambulatory Visit
Admission: RE | Admit: 2019-05-03 | Discharge: 2019-05-03 | Disposition: A | Discharge status: Home / Self Care | Payer: 59 | Source: Ambulatory Visit | Attending: Nephrology | Admitting: Nephrology

## 2019-05-03 DIAGNOSIS — R52 Pain, unspecified: Secondary | ICD-10-CM

## 2019-05-03 DIAGNOSIS — T148XXA Other injury of unspecified body region, initial encounter: Secondary | ICD-10-CM

## 2019-05-08 ENCOUNTER — Encounter: Payer: Self-pay | Admitting: Family Medicine

## 2019-05-08 ENCOUNTER — Other Ambulatory Visit: Payer: Self-pay

## 2019-05-08 ENCOUNTER — Ambulatory Visit (INDEPENDENT_AMBULATORY_CARE_PROVIDER_SITE_OTHER): Payer: 59 | Admitting: Family Medicine

## 2019-05-08 VITALS — BP 107/60 | HR 73 | Temp 98.0°F | Resp 16 | Ht 69.0 in | Wt 235.9 lb

## 2019-05-08 DIAGNOSIS — N185 Chronic kidney disease, stage 5: Secondary | ICD-10-CM | POA: Diagnosis not present

## 2019-05-08 DIAGNOSIS — L819 Disorder of pigmentation, unspecified: Secondary | ICD-10-CM

## 2019-05-08 DIAGNOSIS — D631 Anemia in chronic kidney disease: Secondary | ICD-10-CM

## 2019-05-08 DIAGNOSIS — I10 Essential (primary) hypertension: Secondary | ICD-10-CM

## 2019-05-08 DIAGNOSIS — M79672 Pain in left foot: Secondary | ICD-10-CM

## 2019-05-08 MED ORDER — BENZOYL PEROXIDE WASH 5 % EX LIQD: Freq: Two times a day (BID) | CUTANEOUS | 12 refills | Status: DC

## 2019-05-08 NOTE — Progress Notes (Signed)
Established Patient Office Visit  Subjective:  Patient ID: [name], male    DOB: October 28, 1965  Age: 54 y.o. MRN: [id number]  CC:  Chief Complaint  Patient presents with  . Chronic Kidney Disease    3 month follow up  . Medication Refill    Phoslo    HPI [name] presents for   Patient reports that he is taking medication for his dialysis He reports that he feels better on the 3rd day after the injection for his low anemia He had an xray of his foot and needs to see podiatry.  He has so  Many questions about how to get disability.    Past Medical History:  Diagnosis Date  . Anemia   . Back pain   . CKD (chronic kidney disease), stage III    Stage 4  . Colon polyps    adenomatous  . Elevated cholesterol   . History of echocardiogram    Echo 11/17: EF 65-70, normal wall motion, grade 1 diastolic dysfunction, trivial MR, mild LAE, mild TR  . Hyperlipidemia   . Hypertension    no current bp meds for last 3 months  . Kidney stones 2007  . Medication intolerance    a. multiple with prior nonadherence to regimen.  . Nausea    " in the morning since having the kidney problem "  . Otosclerosis of both ears   . Pneumonia   . PONV (postoperative nausea and vomiting)   . PUD (peptic ulcer disease)    Has had unspecified surgery for this  . Sleep apnea     Past Surgical History:  Procedure Laterality Date  . AV FISTULA PLACEMENT Left 01/04/2019   Procedure: ARTERIOVENOUS (AV) FISTULA CREATION LEFT ARM;  Surgeon: [name], MD;  Location: [place];  Service: Vascular;  Laterality: Left;  . BASCILIC VEIN TRANSPOSITION Left 03/11/2019   Procedure: BASILIC VEIN TRANSPOSITION 2ND STAGE LEFT;  Surgeon: [name], MD;  Location: [place];  Service: Vascular;  Laterality: Left;  . NEPHRECTOMY  02/18/2011   Procedure: NEPHRECTOMY;  Surgeon: Hanley Ben, MD;  Location: [facility];  Service: Urology;  Laterality: Right;  . SMALL INTESTINE SURGERY    . STAPEDOTOMY   2005   lt ear jan, right ear sept  . surgery for ulcers  1990    Family History  Problem Relation Age of Onset  . Hyperlipidemia Father   . Heart disease Mother   . Hypertension Mother   . Hypertension Sister   . Heart disease Brother 55       CABG  . Hyperlipidemia Sister   . Hyperlipidemia Sister   . Esophageal cancer Cousin   . Healthy Child   . Colon cancer Neg Hx   . Stomach cancer Neg Hx   . Rectal cancer Neg Hx     Social History   Socioeconomic History  . Marital status: Married    Spouse name: Edgardo Roys  . Number of children: 5  . Years of education: Not on file  . Highest education level: 12th grade  Occupational History    Employer: [employer]  Tobacco Use  . Smoking status: Never Smoker  . Smokeless tobacco: Never Used  Substance and Sexual Activity  . Alcohol use: No  . Drug use: No  . Sexual activity: Yes    Birth control/protection: None  Other Topics Concern  . Not on file  Social History Narrative   Lives at home with wife and  family. He is from [place]. Came to the [place] in 2002.      Patient is right-handed. He lives in a one level home. He drinks 1-2 cups of coffee and tea a day. He does not exercise.   Social Determinants of Health   Financial Resource Strain: Low Risk   . Difficulty of Paying Living Expenses: Not hard at all  Food Insecurity: No Food Insecurity  . Worried About Charity fundraiser in the Last Year: Never true  . Ran Out of Food in the Last Year: Never true  Transportation Needs: No Transportation Needs  . Lack of Transportation (Medical): No  . Lack of Transportation (Non-Medical): No  Physical Activity: Inactive  . Days of Exercise per Week: 0 days  . Minutes of Exercise per Session: 0 min  Stress: No Stress Concern Present  . Feeling of Stress : Not at all  Social Connections: Slightly Isolated  . Frequency of Communication with Friends and Family: More than three times a week  . Frequency of Social Gatherings with  Friends and Family: More than three times a week  . Attends Religious Services: More than 4 times per year  . Active Member of Clubs or Organizations: No  . Attends Archivist Meetings: Never  . Marital Status: Married  Human resources officer Violence: Not At Risk  . Fear of Current or Ex-Partner: No  . Emotionally Abused: No  . Physically Abused: No  . Sexually Abused: No    Outpatient Medications Prior to Visit  Medication Sig Dispense Refill  . amLODipine (NORVASC) 5 MG tablet Take 1 tablet (5 mg total) by mouth daily. 30 tablet 0  . calcitRIOL (ROCALTROL) 0.5 MCG capsule Take 1 capsule (0.5 mcg total) by mouth daily. 30 capsule 0  . calcium acetate (PHOSLO) 667 MG capsule Take 2 capsules (1,334 mg total) by mouth 3 (three) times daily with meals. 180 capsule 3  . carvedilol (COREG) 25 MG tablet Take 1 tablet (25 mg total) by mouth 2 (two) times daily with a meal. 60 tablet 0  . hydrALAZINE (APRESOLINE) 25 MG tablet Take 1 tablet (25 mg total) by mouth every 8 (eight) hours. 90 tablet 0  . pantoprazole (PROTONIX) 40 MG tablet Take 1 tablet (40 mg total) by mouth daily. 30 tablet 5   No facility-administered medications prior to visit.    Allergies  Allergen Reactions  . Lokelma [Sodium Zirconium Cyclosilicate] Shortness Of Breath  . Beef-Derived Products Other (See Comments)    Cultural preference  . Pegademase Bovine Other (See Comments)    Cultural preference  . Poractant Alfa Other (See Comments)    Cultural preference  . Pork-Derived Products Other (See Comments)    Cultural preference    ROS Review of Systems    Objective:    Physical Exam  BP 107/60   Pulse 73   Temp 98 F (36.7 C) (Temporal)   Resp 16   Ht 5\' 9"  (1.753 m) Comment: per pt  Wt 235 lb 14.4 oz (107 kg)   SpO2 94%   BMI 34.84 kg/m  Wt Readings from Last 3 Encounters:  05/08/19 235 lb 14.4 oz (107 kg)  04/26/19 232 lb (105.2 kg)  04/17/19 229 lb 0.9 oz (103.9 kg)   Physical Exam    Constitutional: Oriented to person, place, and time. Appears well-developed and well-nourished.  HENT:  Head: Normocephalic and atraumatic.  Eyes: Conjunctivae and EOM are normal.  Cardiovascular: Normal rate, regular rhythm, normal heart sounds  and intact distal pulses.  No murmur heard. Pulmonary/Chest: Effort normal and breath sounds normal. No stridor. No respiratory distress. Has no wheezes.  Neurological: Is alert and oriented to person, place, and time.  Skin: Skin is warm. Capillary refill takes less than 2 seconds. Hyperpigmentation of the axilla bilaterally Psychiatric: Has a normal mood and affect. Behavior is normal. Judgment and thought content normal.   Study Result  CLINICAL DATA:  Pain and bruising  EXAM: LEFT ANKLE COMPLETE - 3+ VIEW  COMPARISON:  05/11/2016  FINDINGS: No fracture or malalignment. Osteochondral lesion in the medial talar dome as before. Ankle mortise is symmetric.  IMPRESSION: 1. No acute osseous abnormality. 2. Stable osteochondral lesion in the medial talar dome   Electronically Signed   By: Donavan Foil M.D.   On: 05/03/2019 23:15    There are no preventive care reminders to display for this patient.  There are no preventive care reminders to display for this patient.  Lab Results  Component Value Date   TSH 1.125 08/25/2018   Lab Results  Component Value Date   WBC 5.9 04/17/2019   HGB 8.5 (L) 04/17/2019   HCT 27.8 (L) 04/17/2019   MCV 87.7 04/17/2019   PLT 203 04/17/2019   Lab Results  Component Value Date   NA 139 04/17/2019   K 4.3 04/17/2019   CO2 25 04/17/2019   GLUCOSE 90 04/17/2019   BUN 57 (H) 04/17/2019   CREATININE 7.14 (H) 04/17/2019   BILITOT 0.6 04/11/2019   ALKPHOS 72 04/11/2019   AST 12 (L) 04/11/2019   ALT 18 04/11/2019   PROT 6.2 (L) 04/11/2019   ALBUMIN 3.0 (L) 04/17/2019   CALCIUM 8.2 (L) 04/17/2019   ANIONGAP 12 04/17/2019   GFR 54.07 (L) 05/06/2014   Lab Results  Component Value  Date   CHOL 179 04/13/2019   Lab Results  Component Value Date   HDL 21 (L) 04/13/2019   Lab Results  Component Value Date   LDLCALC 97 04/13/2019   Lab Results  Component Value Date   TRIG 305 (H) 04/13/2019   Lab Results  Component Value Date   CHOLHDL 8.5 04/13/2019   Lab Results  Component Value Date   HGBA1C 4.8 04/13/2019      Assessment & Plan:   Problem List Items Addressed This Visit      Cardiovascular and Mediastinum   Uncontrolled hypertension - Primary    Other Visit Diagnoses    Chronic kidney disease (CKD), active medical management without dialysis, stage 5 ([place])       Anemia in stage 5 chronic kidney disease, not on chronic dialysis ([place])       Left foot pain       Relevant Orders   Ambulatory referral to Podiatry   Hyperpigmentation         Spent 20 minutes with the translator, brought in [place] from Medical Records, discussed how to get records, Reviewed his foot xray and placed referral Discussed using dialysis center case managers This was a visit that was mostly to help patient understand the process Asked him to have the dialysis center send labs     Meds ordered this encounter  Medications  . benzoyl peroxide (BENZOYL PEROXIDE) 5 % external liquid    Sig: Apply topically 2 (two) times daily.    Dispense:  142 g    Refill:  12    Follow-up: No follow-ups on file.    Forrest Moron, MD

## 2019-05-08 NOTE — Patient Instructions (Addendum)
   Discuss with Dialysis Center about the fact that the medications are not being fully filled.     If you have lab work done today you will be contacted with your lab results within the next 2 weeks.  If you have not heard from [place] then please contact us. The fastest way to get your results is to register for My Chart.   IF you received an x-ray today, you will receive an invoice from [facility]. Please contact [facility] at [phone] with questions or concerns regarding your invoice.   IF you received labwork today, you will receive an invoice from [place]. Please contact [facility] at [phone] with questions or concerns regarding your invoice.   Our billing staff will not be able to assist you with questions regarding bills from these companies.  You will be contacted with the lab results as soon as they are available. The fastest way to get your results is to activate your My Chart account. Instructions are located on the last page of this paperwork. If you have not heard from [place] regarding the results in 2 weeks, please contact this office.

## 2019-05-23 ENCOUNTER — Telehealth: Payer: Self-pay | Admitting: Family Medicine

## 2019-05-23 NOTE — Telephone Encounter (Signed)
Patient  Called has not heard from this office . Please could you have them reach out to patient again

## 2019-05-31 ENCOUNTER — Encounter: Payer: Self-pay | Admitting: Cardiology

## 2019-05-31 ENCOUNTER — Other Ambulatory Visit: Payer: Self-pay

## 2019-05-31 ENCOUNTER — Ambulatory Visit: Payer: BLUE CROSS/BLUE SHIELD | Admitting: Cardiology

## 2019-05-31 VITALS — BP 128/78 | HR 81 | Ht 69.0 in | Wt 239.4 lb

## 2019-05-31 DIAGNOSIS — E781 Pure hyperglyceridemia: Secondary | ICD-10-CM

## 2019-05-31 DIAGNOSIS — E785 Hyperlipidemia, unspecified: Secondary | ICD-10-CM | POA: Diagnosis not present

## 2019-05-31 DIAGNOSIS — M25562 Pain in left knee: Secondary | ICD-10-CM

## 2019-05-31 DIAGNOSIS — I10 Essential (primary) hypertension: Secondary | ICD-10-CM | POA: Diagnosis not present

## 2019-05-31 DIAGNOSIS — N184 Chronic kidney disease, stage 4 (severe): Secondary | ICD-10-CM

## 2019-05-31 DIAGNOSIS — M25561 Pain in right knee: Secondary | ICD-10-CM

## 2019-05-31 DIAGNOSIS — Z789 Other specified health status: Secondary | ICD-10-CM

## 2019-05-31 NOTE — Patient Instructions (Signed)
Medication Instructions:  NO CHANGES  If you need a refill on your cardiac medications before your next appointment, please call your pharmacy*   Lab Work:MAY OR June FASTING LIPID AND CMET   MAY If you have labs (blood work) drawn today and your tests are completely normal, you will receive your results only by: Marland Kitchen MyChart Message (if you have MyChart) OR . A paper copy in the mail If you have any lab test that is abnormal or we need to change your treatment, we will call you to review the results.     Follow-Up: You have been referred to  At [facility] you and your health needs are our priority.  As part of our continuing mission to provide you with exceptional heart care, we have created designated Provider Care Teams.  These Care Teams include your primary Cardiologist (physician) and Advanced Practice Providers (APPs -  Physician Assistants and Nurse Practitioners) who all work together to provide you with the care you need, when you need it.  We recommend signing up for the patient portal called "MyChart".  Sign up information is provided on this After Visit Summary.  MyChart is used to connect with patients for Virtual Visits (Telemedicine).  Patients are able to view lab/test results, encounter notes, upcoming appointments, etc.  Non-urgent messages can be sent to your provider as well.   To learn more about what you can do with MyChart, go to [email or web address].    Your next appointment:   6 month(s)  The format for your next appointment:   In Person  Provider:   Ena Dawley, MD   Other Instructions

## 2019-05-31 NOTE — Progress Notes (Signed)
Cardiology Office Note    Date:  05/31/2019  ID:  [name], DOB Dec 31, 1965, MRN [id number] PCP:  Forrest Moron, MD  Cardiologist:  Ena Dawley, MD   Chief Complaint: Lower extremity cramping, dizziness at dialysis  History of Present Illness:  [name] is a 54 y.o. male with history of HTN, OSA (declines CPAP), noncompliance with medications with frequent intolerances, HLD with significant hypertriglyceridemia, CKD stage V recently started on hemodialysis.    His management has been very complicated by patient's refusal to comply with medications and inconsistency with multiple providers involved in his care. He had remote nuc in 2015 that was low risk. 2D echo in 2017 showed EF 65-70%, grade 1 DD, elevated LVEDP, mild LAE, normal RV, mild TR. He previously saw Dr. [name] 10/07/16 complaining of inability to sleep but refused CPAP. When he saw Ermalinda Barrios 11/2016 he had stopped all of his medicines because he felt better not taking them. He was followed by the HTN clinic for his BP and lipids for a period of time. Per PharmD's note 12/2017, "Patient states that he does not think that his body needs medications anymore because they make him feel poorly. Patient asks about the process to get disability insurance, as he is unable to work anymore."   Medication reconciliation is a tremendous challenge as he does not remember what he takes or he does not take it.   05/31/2019 -the patient is coming after 6 months, he was started on hemodialysis on March 1, he states that he is feeling significantly better however states multiple problem that per patient were "not fixed by hemodialysis".  Among those he lists difficulties to get up from sitting because of back pain, bilateral knee pain, frequent calf cramping, as well as claudications.  He states that on 3 occasions he developed hypotension after dialysis.  He denies any syncope or falls.  No chest pain no significant dyspnea on exertion and no  lower extremity edema.  Past Medical History:  Diagnosis Date  . Anemia   . Back pain   . CKD (chronic kidney disease), stage III    Stage 4  . Colon polyps    adenomatous  . Elevated cholesterol   . History of echocardiogram    Echo 11/17: EF 65-70, normal wall motion, grade 1 diastolic dysfunction, trivial MR, mild LAE, mild TR  . Hyperlipidemia   . Hypertension    no current bp meds for last 3 months  . Kidney stones 2007  . Medication intolerance    a. multiple with prior nonadherence to regimen.  . Nausea    " in the morning since having the kidney problem "  . Otosclerosis of both ears   . Pneumonia   . PONV (postoperative nausea and vomiting)   . PUD (peptic ulcer disease)    Has had unspecified surgery for this  . Sleep apnea     Past Surgical History:  Procedure Laterality Date  . AV FISTULA PLACEMENT Left 01/04/2019   Procedure: ARTERIOVENOUS (AV) FISTULA CREATION LEFT ARM;  Surgeon: [name], MD;  Location: [facility];  Service: Vascular;  Laterality: Left;  . BASCILIC VEIN TRANSPOSITION Left 03/11/2019   Procedure: BASILIC VEIN TRANSPOSITION 2ND STAGE LEFT;  Surgeon: [name], MD;  Location: [facility];  Service: Vascular;  Laterality: Left;  . NEPHRECTOMY  02/18/2011   Procedure: NEPHRECTOMY;  Surgeon: Hanley Ben, MD;  Location: [facility];  Service: Urology;  Laterality: Right;  . SMALL  INTESTINE SURGERY    . STAPEDOTOMY  2005   lt ear jan, right ear sept  . surgery for ulcers  1990    Current Medications: Current Meds  Medication Sig  . benzoyl peroxide (BENZOYL PEROXIDE) 5 % external liquid Apply topically 2 (two) times daily.  . calcium acetate (PHOSLO) 667 MG capsule Take 2 capsules (1,334 mg total) by mouth 3 (three) times daily with meals.  . pantoprazole (PROTONIX) 40 MG tablet Take 1 tablet (40 mg total) by mouth daily.      Allergies:   Lokelma [sodium zirconium cyclosilicate], Beef-derived products, Pegademase bovine, Poractant  alfa, and Pork-derived products   Social History   Socioeconomic History  . Marital status: Married    Spouse name: Edgardo Roys  . Number of children: 5  . Years of education: Not on file  . Highest education level: 12th grade  Occupational History    Employer: [employer]  Tobacco Use  . Smoking status: Never Smoker  . Smokeless tobacco: Never Used  Substance and Sexual Activity  . Alcohol use: No  . Drug use: No  . Sexual activity: Yes    Birth control/protection: None  Other Topics Concern  . Not on file  Social History Narrative   Lives at home with wife and family. He is from [place]. Came to the [place] in 2002.      Patient is right-handed. He lives in a one level home. He drinks 1-2 cups of coffee and tea a day. He does not exercise.   Social Determinants of Health   Financial Resource Strain: Low Risk   . Difficulty of Paying Living Expenses: Not hard at all  Food Insecurity: No Food Insecurity  . Worried About Charity fundraiser in the Last Year: Never true  . Ran Out of Food in the Last Year: Never true  Transportation Needs: No Transportation Needs  . Lack of Transportation (Medical): No  . Lack of Transportation (Non-Medical): No  Physical Activity: Inactive  . Days of Exercise per Week: 0 days  . Minutes of Exercise per Session: 0 min  Stress: No Stress Concern Present  . Feeling of Stress : Not at all  Social Connections: Slightly Isolated  . Frequency of Communication with Friends and Family: More than three times a week  . Frequency of Social Gatherings with Friends and Family: More than three times a week  . Attends Religious Services: More than 4 times per year  . Active Member of Clubs or Organizations: No  . Attends Archivist Meetings: Never  . Marital Status: Married     Family History:  The patient's family history includes Esophageal cancer in his cousin; Healthy in his child; Heart disease in his mother; Heart disease (age of onset: 65)  in his brother; Hyperlipidemia in his father, sister, and sister; Hypertension in his mother and sister. There is no history of Colon cancer, Stomach cancer, or Rectal cancer.  ROS:   Please see the history of present illness.   All other systems are reviewed and otherwise negative.    PHYSICAL EXAM:   VS:  BP 128/78   Pulse 81   Ht 5\' 9"  (1.753 m)   Wt 239 lb 6.4 oz (108.6 kg)   SpO2 98%   BMI 35.35 kg/m   BMI: Body mass index is 35.35 kg/m. GEN: Well nourished, well developed M, in no acute distress HEENT: normocephalic, atraumatic Neck: no JVD, carotid bruits, or masses Cardiac: RRR; no murmurs, rubs,  or gallops, 1+ stiff chronic appearing BLE edema  Respiratory:  clear to auscultation bilaterally, normal work of breathing GI: soft, nontender, nondistended, + BS MS: no deformity or atrophy Skin: warm and dry, no rash Neuro:  Alert and Oriented x 3, Strength and sensation are intact, follows commands Psych: euthymic mood, full affect  Wt Readings from Last 3 Encounters:  05/31/19 239 lb 6.4 oz (108.6 kg)  05/08/19 235 lb 14.4 oz (107 kg)  04/26/19 232 lb (105.2 kg)    Studies/Labs Reviewed:   EKG:  EKG was ordered today and personally reviewed by me and demonstrates NSR 80bpm, possible LAE, nonspecific TW changes possibly due to LVH (TWI I, avL)  Recent Labs: 08/25/2018: TSH 1.125 04/11/2019: ALT 18; B Natriuretic Peptide 752.1 04/17/2019: BUN 57; Creatinine, Ser 7.14; Hemoglobin 8.5; Magnesium 1.9; Platelets 203; Potassium 4.3; Sodium 139   Lipid Panel    Component Value Date/Time   CHOL 179 04/13/2019 0450   CHOL 264 (H) 03/01/2019 1237   TRIG 305 (H) 04/13/2019 0450   HDL 21 (L) 04/13/2019 0450   HDL 23 (L) 03/01/2019 1237   CHOLHDL 8.5 04/13/2019 0450   VLDL 61 (H) 04/13/2019 0450   LDLCALC 97 04/13/2019 0450   LDLCALC 109 (H) 03/01/2019 1237   LDLDIRECT 63 08/24/2018 1458   LDLDIRECT 139.0 05/06/2014 1209    Additional studies/ records that were reviewed  today include: Summarized above   ASSESSMENT & PLAN:   1. Essential HTN -his blood pressure is finally controlled with hemodialysis, he now develops hypotension postdialysis, he is advised to hold his blood pressure meds the morning of dialysis. 2. Hyperlipidemia/hypertigylceridemia -he was taking Vascepa with significant improvement in his triglycerides however stopped taking it as he was complaining of multiple side effects including headaches.  We will repeat his labs again next months now that he is on dialysis and adjust medication as needed.   3. CKD stage V now on hemodialysis - managed by nephrology.  4. Lower extremity edema -this has resolved with hemodialysis. 5. Lower extremity cramping -he is advised to take 400 mg of magnesium as needed.  His most recent magnesium was 1.5.  Disposition: F/u in 6 months, obtain lipids and CMP in May 2021.  Medication Adjustments/Labs and Tests Ordered: Current medicines are reviewed at length with the patient today.  Concerns regarding medicines are outlined above. Medication changes, Labs and Tests ordered today are summarized above and listed in the Patient Instructions accessible in Encounters.   Signed, Ena Dawley, MD  05/31/2019 2:33 PM    [address], [place], [place]  [address] Phone: [phone]; Fax: [phone]

## 2019-06-10 ENCOUNTER — Telehealth: Payer: Self-pay | Admitting: *Deleted

## 2019-06-10 ENCOUNTER — Other Ambulatory Visit: Payer: BLUE CROSS/BLUE SHIELD | Admitting: *Deleted

## 2019-06-10 ENCOUNTER — Other Ambulatory Visit: Payer: Self-pay

## 2019-06-10 DIAGNOSIS — E785 Hyperlipidemia, unspecified: Secondary | ICD-10-CM | POA: Diagnosis not present

## 2019-06-10 NOTE — Telephone Encounter (Signed)
Dr. [name], you wanted this pt to have his labs done in May at your last OV with him on 4/16. He walked into the office requesting for his labs to be done today, to check cmet and lipids, despite [place] telling him he is not due for this until May.  Screening table downstairs states he was very persistent on having ordered labs done today, and not early May.  Screening table stated he was argumentative about this, so that sent him up for his lab appt early. Pt is now in the lab having them drawn.  Just wanted to let you know, being he is early getting this done.

## 2019-06-11 LAB — COMPREHENSIVE METABOLIC PANEL
ALT: 18 IU/L (ref 0–44)
AST: 12 IU/L (ref 0–40)
Albumin/Globulin Ratio: 2.1 (ref 1.2–2.2)
Albumin: 4.5 g/dL (ref 3.8–4.9)
Alkaline Phosphatase: 58 IU/L (ref 39–117)
BUN/Creatinine Ratio: 6 — ABNORMAL LOW (ref 9–20)
BUN: 56 mg/dL — ABNORMAL HIGH (ref 6–24)
Bilirubin Total: 0.2 mg/dL (ref 0.0–1.2)
CO2: 24 mmol/L (ref 20–29)
Calcium: 9.3 mg/dL (ref 8.7–10.2)
Chloride: 97 mmol/L (ref 96–106)
Creatinine, Ser: 8.86 mg/dL — ABNORMAL HIGH (ref 0.76–1.27)
GFR calc Af Amer: 7 mL/min/{1.73_m2} — ABNORMAL LOW (ref >59)
GFR calc non Af Amer: 6 mL/min/{1.73_m2} — ABNORMAL LOW (ref >59)
Globulin, Total: 2.1 g/dL (ref 1.5–4.5)
Glucose: 87 mg/dL (ref 65–99)
Potassium: 5.3 mmol/L — ABNORMAL HIGH (ref 3.5–5.2)
Sodium: 141 mmol/L (ref 134–144)
Total Protein: 6.6 g/dL (ref 6.0–8.5)

## 2019-06-11 LAB — LIPID PANEL
Chol/HDL Ratio: 9.8 ratio — ABNORMAL HIGH (ref 0.0–5.0)
Cholesterol, Total: 274 mg/dL — ABNORMAL HIGH (ref 100–199)
HDL: 28 mg/dL — ABNORMAL LOW (ref >39)
LDL Chol Calc (NIH): 133 mg/dL — ABNORMAL HIGH (ref 0–99)
Triglycerides: 607 mg/dL (ref 0–149)
VLDL Cholesterol Cal: 113 mg/dL — ABNORMAL HIGH (ref 5–40)

## 2019-06-12 ENCOUNTER — Telehealth: Payer: Self-pay | Admitting: *Deleted

## 2019-06-12 DIAGNOSIS — Z789 Other specified health status: Secondary | ICD-10-CM

## 2019-06-12 DIAGNOSIS — E781 Pure hyperglyceridemia: Secondary | ICD-10-CM

## 2019-06-12 DIAGNOSIS — N184 Chronic kidney disease, stage 4 (severe): Secondary | ICD-10-CM

## 2019-06-12 DIAGNOSIS — E7889 Other lipoprotein metabolism disorders: Secondary | ICD-10-CM

## 2019-06-12 DIAGNOSIS — E785 Hyperlipidemia, unspecified: Secondary | ICD-10-CM

## 2019-06-12 NOTE — Telephone Encounter (Signed)
-----   Message from [name], MD sent at 06/11/2019 11:54 AM EDT ----- Can you please refer him to Dr Debara Pickett for lipid management? Thank you

## 2019-06-12 NOTE — Telephone Encounter (Signed)
Spoke with the pt and informed him that per Dr. [name], his lipids were elevated, and she recommends that we refer him to Dr. Debara Pickett in [facility], for further management of this.  Informed the pt that I will place the referral in the system and send our [facility] a message to call him back and arrange this appt.  Pt states he can only attend appts on M,W,F, due to dialysis on T,Th, and Sat. Informed the pt that I will endorse this information to our Scheduler, so that she is aware to arrange his appt with Dr. Debara Pickett on one of those days. Pt verbalized understanding and agrees with this plan.

## 2019-06-14 ENCOUNTER — Ambulatory Visit: Payer: Self-pay | Admitting: Podiatry

## 2019-06-14 ENCOUNTER — Ambulatory Visit: Payer: 59 | Admitting: Podiatry

## 2019-06-14 ENCOUNTER — Other Ambulatory Visit: Payer: Self-pay

## 2019-06-14 DIAGNOSIS — Z992 Dependence on renal dialysis: Secondary | ICD-10-CM | POA: Diagnosis not present

## 2019-06-14 DIAGNOSIS — N186 End stage renal disease: Secondary | ICD-10-CM | POA: Diagnosis not present

## 2019-06-14 DIAGNOSIS — I129 Hypertensive chronic kidney disease with stage 1 through stage 4 chronic kidney disease, or unspecified chronic kidney disease: Secondary | ICD-10-CM | POA: Diagnosis not present

## 2019-06-21 DIAGNOSIS — M25561 Pain in right knee: Secondary | ICD-10-CM | POA: Diagnosis not present

## 2019-06-21 DIAGNOSIS — M25572 Pain in left ankle and joints of left foot: Secondary | ICD-10-CM | POA: Diagnosis not present

## 2019-06-21 DIAGNOSIS — M25562 Pain in left knee: Secondary | ICD-10-CM | POA: Diagnosis not present

## 2019-07-01 ENCOUNTER — Ambulatory Visit (INDEPENDENT_AMBULATORY_CARE_PROVIDER_SITE_OTHER): Payer: BC Managed Care – PPO | Admitting: Emergency Medicine

## 2019-07-01 ENCOUNTER — Other Ambulatory Visit: Payer: Self-pay

## 2019-07-01 ENCOUNTER — Other Ambulatory Visit: Payer: BLUE CROSS/BLUE SHIELD

## 2019-07-01 ENCOUNTER — Encounter: Payer: Self-pay | Admitting: Emergency Medicine

## 2019-07-01 VITALS — BP 116/71 | HR 82 | Temp 98.4°F | Resp 15 | Ht 69.0 in | Wt 241.0 lb

## 2019-07-01 DIAGNOSIS — I5022 Chronic systolic (congestive) heart failure: Secondary | ICD-10-CM

## 2019-07-01 DIAGNOSIS — Z7689 Persons encountering health services in other specified circumstances: Secondary | ICD-10-CM | POA: Diagnosis not present

## 2019-07-01 DIAGNOSIS — N186 End stage renal disease: Secondary | ICD-10-CM

## 2019-07-01 DIAGNOSIS — Z992 Dependence on renal dialysis: Secondary | ICD-10-CM

## 2019-07-01 DIAGNOSIS — I110 Hypertensive heart disease with heart failure: Secondary | ICD-10-CM | POA: Diagnosis not present

## 2019-07-01 NOTE — Patient Instructions (Addendum)
If you have lab work done today you will be contacted with your lab results within the next 2 weeks.  If you have not heard from [place] then please contact us. The fastest way to get your results is to register for My Chart.   IF you received an x-ray today, you will receive an invoice from [facility]. Please contact [facility] at [phone] with questions or concerns regarding your invoice.   IF you received labwork today, you will receive an invoice from [facility]. Please contact [facility] at [phone] with questions or concerns regarding your invoice.   Our billing staff will not be able to assist you with questions regarding bills from these companies.  You will be contacted with the lab results as soon as they are available. The fastest way to get your results is to activate your My Chart account. Instructions are located on the last page of this paperwork. If you have not heard from [place] regarding the results in 2 weeks, please contact this office.     Hemodialysis, Care After This sheet gives you information about how to care for yourself after your procedure. Your doctor may also give you more specific instructions. If you have problems or questions, contact your doctor. What can I expect after the procedure? After the procedure, it is common to:  Feel tired (fatigued). It is normal to get energy levels back the day after the procedure.  Feel itchy. Your doctor may be able to prescribe medicine that can help.  Have achy or jumpy legs. You may feel like you need to kick your legs. This sometimes causes sleep problems. Follow these instructions at home: Eating and drinking   Follow instructions from your doctor about what you cannot eat or drink.  Talk with your doctor about how much fluid you can drink. Track how much fluid you drink so you do not drink more than you should. This is important. Fluid can build up in your body between dialysis sessions.  Built-up fluid: ? Affects blood pressure. ? Can make your heart work harder.  Limit your salt (sodium) intake. This is important because: ? Salt causes thirst. This is a problem. You need to limit how much fluid you can drink every day. ? Salt can change blood pressure. Having less salt in your diet may help you keep a healthy blood pressure.  Limit how much potassium you have. Levels of this mineral can rise between dialysis sessions. High levels can cause a dangerous heart rhythm. This can threaten your life.  Limit how much phosphorus you have. Having too much of this mineral: ? Can make bones lose calcium. This makes bones weaker and more likely to break. ? May make your skin itch.  Eat high-quality proteins that are low in phosphorus. High-quality proteins include those found in fish, poultry, eggs, beans, and lentils. Care of your vascular access site  Avoid sleeping on the site where blood is taken away from your body and sent back to your body (vascular access site).  If your access site is on your arm: ? Do not lift any heavy things with that arm. ? Do not wear tight clothing over that arm. ? Do not have your blood pressure measured on that arm. ? Do not have needles put into that arm. These include needles used to draw blood.  If you have a long, thin tube (catheter), do not open it between dialysis sessions.  Sometimes an access site is made  through surgery (fistula). Sometimes the access site is a tube that is placed (graft). If you have one of these: ? Follow instructions from your doctor about how to take care of your access site. Wash it with soap every day and before each dialysis session. ? Make sure you:  Wash your hands with soap and water for at least 20 seconds before and after you change your bandage (dressing). If you cannot use soap and water, use hand sanitizer.  Change your bandage as told by your doctor. ? Feel for a slight trembling over the access site  (thrill) every day. If you feel trembling, the access is working. ? Check your access site every day for signs of infection. Check for:  Redness, swelling, or pain.  Fluid or blood.  Warmth.  Pus or a bad smell. Activity  Rest as told by your doctor.  Return to your normal activities as told by your doctor. Ask your doctor what activities are safe for you. Managing trouble pooping Your condition may cause trouble pooping (constipation). To prevent or treat trouble pooping, you may need to:  Take over-the-counter or prescription medicines.  Eat foods that are high in fiber, such as: ? Beans. ? Whole grains. ? Fresh fruits. ? Fresh vegetables.  Limit foods that are high in fat and sugar. These include fried or sweet foods. Alcohol use  Do not drink alcohol if: ? Your doctor tells you not to drink. ? You are pregnant, may be pregnant, or are planning to become pregnant.  If you drink alcohol: ? Limit how much you use to:  0-1 drink a day for women.  0-2 drinks a day for men. ? Be aware of how much alcohol is in your drink. In the [place] one drink equals one 12 oz bottle of beer (355 mL), one 5 oz glass of wine (148 mL), or one 1 oz glass of hard liquor (44 mL). General instructions   Take over-the-counter and prescription medicines only as told by your doctor. This includes vitamin and mineral supplements. Talk with your doctor before taking new supplements.  Do not take baths, swim, or use a hot tub until your doctor approves. Ask your doctor if you may take showers. You may only be allowed to take sponge baths.  Carry an item that shows you get dialysis. The item may be a wallet card, bracelet, or medical identification tag. Always keep the item with you. This helps if an accident or medical emergency happens. If you cannot speak, the item will let doctors know about your condition.  Keep all dialysis visits as told by your doctor. Dialysis sessions should be scheduled  regularly. Do not skip a session. Contact a doctor if:  You have a fever.  You have chills.  You have any of these signs of infection at or near your access site: ? Warmth. ? Redness, swelling, or pain. ? Fluid or blood. ? Pus or a bad smell.  You find a pimple on your access site. Get help right away if:  You have symptoms at the access site on your arm, such as: ? The hand on the side of your body where you had surgery gets painful, loses feeling (gets numb), or gets very pale. ? No trembling at the access site.  You get dizzy or weak, and you did not have this before.  You get short of breath or get chest pain.  You have bleeding at your access site that is hard to  control.  You get mixed up (confused), or you do not know the time of day, where you are, or who you are.  You get blurred vision.  You have a seizure. These symptoms may be an emergency. Do not wait to see if the symptoms will go away. Get medical help right away. Call your local emergency services (911 in the [place] Do not drive yourself to the hospital. Summary  After the procedure, it is common to feel tired, feel itchy, and have achy or jumpy legs.  Track how much fluid you drink, and limit how much of some minerals you are getting. Eat high-quality proteins, and take supplements as told.  Take good care of your access site. Tell your doctor if you have problems. These may include warmth, redness, swelling, fluid, blood, pus, or a bad smell. This information is not intended to replace advice given to you by your health care provider. Make sure you discuss any questions you have with your health care provider. Document Revised: 10/03/2018 Document Reviewed: 10/03/2018 Elsevier Patient Education  [place].

## 2019-07-01 NOTE — Progress Notes (Signed)
[name] 54 y.o.   Chief Complaint  Patient presents with  . Establish Care    pt had a sore throat a few days ago in the mornings specifically.   05/31/2019 cardiology assessment and plan as follows:  ASSESSMENT & PLAN:   1. Essential HTN -his blood pressure is finally controlled with hemodialysis, he now develops hypotension postdialysis, he is advised to hold his blood pressure meds the morning of dialysis. 2. Hyperlipidemia/hypertigylceridemia -he was taking Vascepa with significant improvement in his triglycerides however stopped taking it as he was complaining of multiple side effects including headaches.  We will repeat his labs again next months now that he is on dialysis and adjust medication as needed.   3. CKD stage V now on hemodialysis - managed by nephrology.  4. Lower extremity edema -this has resolved with hemodialysis. 5. Lower extremity cramping -he is advised to take 400 mg of magnesium as needed.  His most recent magnesium was 1.5.  HISTORY OF PRESENT ILLNESS: This is a 54 y.o. male here to establish care with me.  Used to see Dr. Nolon Rod.  Has multiple chronic medical problems: 1.  End-stage renal disease recently started on hemodialysis.  On kidney transplant list. 2.  Hypertensive heart disease with chronic congestive heart failure. 3.  Dyslipidemia with increase in triglycerides. 4.  GERD  HPI   Prior to Admission medications   Medication Sig Start Date End Date Taking? Authorizing Provider  benzoyl peroxide (BENZOYL PEROXIDE) 5 % external liquid Apply topically 2 (two) times daily. 05/08/19  Yes Forrest Moron, MD  calcium acetate (PHOSLO) 667 MG capsule Take 2 capsules (1,334 mg total) by mouth 3 (three) times daily with meals. Patient taking differently: Take 2,001 mg by mouth 3 (three) times daily with meals.  04/17/19  Yes Alma Friendly, MD  pantoprazole (PROTONIX) 40 MG tablet Take 1 tablet (40 mg total) by mouth daily. 04/17/19  Yes Alma Friendly, MD  amLODipine (NORVASC) 5 MG tablet Take 1 tablet (5 mg total) by mouth daily. 04/18/19 05/18/19  Alma Friendly, MD  carvedilol (COREG) 25 MG tablet Take 1 tablet (25 mg total) by mouth 2 (two) times daily with a meal. 04/17/19 05/17/19  Alma Friendly, MD  hydrALAZINE (APRESOLINE) 25 MG tablet Take 1 tablet (25 mg total) by mouth every 8 (eight) hours. 04/17/19 05/17/19  Alma Friendly, MD    Allergies  Allergen Reactions  . Lokelma [Sodium Zirconium Cyclosilicate] Shortness Of Breath  . Beef-Derived Products Other (See Comments)    Cultural preference  . Pegademase Bovine Other (See Comments)    Cultural preference  . Poractant Alfa Other (See Comments)    Cultural preference  . Pork-Derived Products Other (See Comments)    Cultural preference    Patient Active Problem List   Diagnosis Date Noted  . Hypertensive heart disease with chronic systolic congestive heart failure ([place]) 07/01/2019  . DCM (dilated cardiomyopathy) ([place])   . Acute systolic (congestive) heart failure ([place])   . Acute exacerbation of CHF (congestive heart failure) ([place]) 04/11/2019  . Metabolic acidosis 33/82/5053  . CKD (chronic kidney disease) stage 5, GFR less than 15 ml/min ([facility]) 04/11/2019  . History of colon polyps 03/20/2019  . Gastroesophageal reflux disease 03/20/2019  . CKD (chronic kidney disease) stage 4, GFR 15-29 ml/min ([facility]) 08/25/2018  . CKD (chronic kidney disease) stage 3, GFR 30-59 ml/min 05/10/2017  . Renal insufficiency 11/15/2016  . Acute on chronic diastolic CHF (congestive heart  failure) ([place]) 10/07/2016  . Testicular hypofunction 09/15/2015  . BPH without obstruction/lower urinary tract symptoms 09/15/2015  . Bilateral hearing loss 07/03/2015  . Mixed hearing loss of right ear 07/03/2015  . Hyperlipidemia 03/29/2013  . Nephrolithiasis 03/14/2013  . Lumbar radiculopathy 02/26/2013  . Vitamin D deficiency 08/03/2011  . Uncontrolled hypertension 04/10/2010  . HLD  (hyperlipidemia) 04/10/2010    Past Medical History:  Diagnosis Date  . Anemia   . Back pain   . CKD (chronic kidney disease), stage III    Stage 4  . Colon polyps    adenomatous  . Elevated cholesterol   . History of echocardiogram    Echo 11/17: EF 65-70, normal wall motion, grade 1 diastolic dysfunction, trivial MR, mild LAE, mild TR  . Hyperlipidemia   . Hypertension    no current bp meds for last 3 months  . Kidney stones 2007  . Medication intolerance    a. multiple with prior nonadherence to regimen.  . Nausea    " in the morning since having the kidney problem "  . Otosclerosis of both ears   . Pneumonia   . PONV (postoperative nausea and vomiting)   . PUD (peptic ulcer disease)    Has had unspecified surgery for this  . Sleep apnea     Past Surgical History:  Procedure Laterality Date  . AV FISTULA PLACEMENT Left 01/04/2019   Procedure: ARTERIOVENOUS (AV) FISTULA CREATION LEFT ARM;  Surgeon: [name], MD;  Location: [facility];  Service: Vascular;  Laterality: Left;  . BASCILIC VEIN TRANSPOSITION Left 03/11/2019   Procedure: BASILIC VEIN TRANSPOSITION 2ND STAGE LEFT;  Surgeon: [name], MD;  Location: [facility];  Service: Vascular;  Laterality: Left;  . NEPHRECTOMY  02/18/2011   Procedure: NEPHRECTOMY;  Surgeon: Hanley Ben, MD;  Location: [facility];  Service: Urology;  Laterality: Right;  . SMALL INTESTINE SURGERY    . STAPEDOTOMY  2005   lt ear jan, right ear sept  . surgery for ulcers  1990    Social History   Socioeconomic History  . Marital status: Married    Spouse name: Edgardo Roys  . Number of children: 5  . Years of education: Not on file  . Highest education level: 12th grade  Occupational History    Employer: [employer]  Tobacco Use  . Smoking status: Never Smoker  . Smokeless tobacco: Never Used  Substance and Sexual Activity  . Alcohol use: No  . Drug use: No  . Sexual activity: Yes    Birth control/protection: None    Other Topics Concern  . Not on file  Social History Narrative   Lives at home with wife and family. He is from [place]. Came to the [place] in 2002.      Patient is right-handed. He lives in a one level home. He drinks 1-2 cups of coffee and tea a day. He does not exercise.   Social Determinants of Health   Financial Resource Strain: Low Risk   . Difficulty of Paying Living Expenses: Not hard at all  Food Insecurity: No Food Insecurity  . Worried About Charity fundraiser in the Last Year: Never true  . Ran Out of Food in the Last Year: Never true  Transportation Needs: No Transportation Needs  . Lack of Transportation (Medical): No  . Lack of Transportation (Non-Medical): No  Physical Activity: Inactive  . Days of Exercise per Week: 0 days  . Minutes of Exercise per Session: 0  min  Stress: No Stress Concern Present  . Feeling of Stress : Not at all  Social Connections: Slightly Isolated  . Frequency of Communication with Friends and Family: More than three times a week  . Frequency of Social Gatherings with Friends and Family: More than three times a week  . Attends Religious Services: More than 4 times per year  . Active Member of Clubs or Organizations: No  . Attends Archivist Meetings: Never  . Marital Status: Married  Human resources officer Violence: Not At Risk  . Fear of Current or Ex-Partner: No  . Emotionally Abused: No  . Physically Abused: No  . Sexually Abused: No    Family History  Problem Relation Age of Onset  . Hyperlipidemia Father   . Heart disease Mother   . Hypertension Mother   . Hypertension Sister   . Heart disease Brother 68       CABG  . Hyperlipidemia Sister   . Hyperlipidemia Sister   . Esophageal cancer Cousin   . Healthy Child   . Colon cancer Neg Hx   . Stomach cancer Neg Hx   . Rectal cancer Neg Hx      Review of Systems  Constitutional: Negative.  Negative for chills and fever.  HENT: Negative.  Negative for congestion and sore  throat.   Respiratory: Negative.  Negative for cough and shortness of breath.   Cardiovascular: Negative.  Negative for chest pain and palpitations.  Gastrointestinal: Negative.  Negative for abdominal pain, nausea and vomiting.  Musculoskeletal: Negative for myalgias.       Pain to both legs mostly left foot  Skin: Negative.   Neurological: Negative.  Negative for dizziness and headaches.  Endo/Heme/Allergies: Negative.   All other systems reviewed and are negative.  Today's Vitals   07/01/19 1518  BP: 116/71  Pulse: 82  Resp: 15  Temp: 98.4 F (36.9 C)  TempSrc: Temporal  SpO2: 98%  Weight: 241 lb (109.3 kg)  Height: 5\' 9"  (1.753 m)   Body mass index is 35.59 kg/m.   Physical Exam Vitals reviewed.  Constitutional:      Appearance: Normal appearance.  HENT:     Head: Normocephalic.     Mouth/Throat:     Mouth: Mucous membranes are moist.     Pharynx: Oropharynx is clear.  Eyes:     Extraocular Movements: Extraocular movements intact.     Pupils: Pupils are equal, round, and reactive to light.  Cardiovascular:     Rate and Rhythm: Normal rate and regular rhythm.     Pulses: Normal pulses.     Heart sounds: Normal heart sounds.  Pulmonary:     Effort: Pulmonary effort is normal.     Breath sounds: Normal breath sounds.  Abdominal:     Palpations: Abdomen is soft.     Tenderness: There is no abdominal tenderness.  Musculoskeletal:        General: Normal range of motion.     Cervical back: Normal range of motion.  Skin:    General: Skin is warm and dry.     Comments: AV fistula left upper arm  Neurological:     General: No focal deficit present.     Mental Status: He is alert and oriented to person, place, and time.  Psychiatric:        Mood and Affect: Mood normal.        Behavior: Behavior normal.     A total of 30 minutes was  spent with the patient, greater than 50% of which was in counseling/coordination of care regarding chronic medical problems and  cardiovascular risks associated with these, review of most recent office visit notes including specialists office visit notes, review of most recent blood work results, review of past medical history, diet and nutrition, establishing care prognosis and follow-up.  ASSESSMENT & PLAN: Koltan was seen today for establish care.  Diagnoses and all orders for this visit:  Hypertensive heart disease with chronic systolic congestive heart failure ([place])  End stage renal disease on dialysis [facility])  Encounter to establish care    Patient Instructions       If you have lab work done today you will be contacted with your lab results within the next 2 weeks.  If you have not heard from [place] then please contact us. The fastest way to get your results is to register for My Chart.   IF you received an x-ray today, you will receive an invoice from [facility]. Please contact [facility] at [phone] with questions or concerns regarding your invoice.   IF you received labwork today, you will receive an invoice from [facility]. Please contact [facility] at [phone] with questions or concerns regarding your invoice.   Our billing staff will not be able to assist you with questions regarding bills from these companies.  You will be contacted with the lab results as soon as they are available. The fastest way to get your results is to activate your My Chart account. Instructions are located on the last page of this paperwork. If you have not heard from [place] regarding the results in 2 weeks, please contact this office.     Hemodialysis, Care After This sheet gives you information about how to care for yourself after your procedure. Your doctor may also give you more specific instructions. If you have problems or questions, contact your doctor. What can I expect after the procedure? After the procedure, it is common to:  Feel tired (fatigued). It is normal to get energy levels back the  day after the procedure.  Feel itchy. Your doctor may be able to prescribe medicine that can help.  Have achy or jumpy legs. You may feel like you need to kick your legs. This sometimes causes sleep problems. Follow these instructions at home: Eating and drinking   Follow instructions from your doctor about what you cannot eat or drink.  Talk with your doctor about how much fluid you can drink. Track how much fluid you drink so you do not drink more than you should. This is important. Fluid can build up in your body between dialysis sessions. Built-up fluid: ? Affects blood pressure. ? Can make your heart work harder.  Limit your salt (sodium) intake. This is important because: ? Salt causes thirst. This is a problem. You need to limit how much fluid you can drink every day. ? Salt can change blood pressure. Having less salt in your diet may help you keep a healthy blood pressure.  Limit how much potassium you have. Levels of this mineral can rise between dialysis sessions. High levels can cause a dangerous heart rhythm. This can threaten your life.  Limit how much phosphorus you have. Having too much of this mineral: ? Can make bones lose calcium. This makes bones weaker and more likely to break. ? May make your skin itch.  Eat high-quality proteins that are low in phosphorus. High-quality proteins include those found in fish, poultry, eggs, beans, and  lentils. Care of your vascular access site  Avoid sleeping on the site where blood is taken away from your body and sent back to your body (vascular access site).  If your access site is on your arm: ? Do not lift any heavy things with that arm. ? Do not wear tight clothing over that arm. ? Do not have your blood pressure measured on that arm. ? Do not have needles put into that arm. These include needles used to draw blood.  If you have a long, thin tube (catheter), do not open it between dialysis sessions.  Sometimes an access  site is made through surgery (fistula). Sometimes the access site is a tube that is placed (graft). If you have one of these: ? Follow instructions from your doctor about how to take care of your access site. Wash it with soap every day and before each dialysis session. ? Make sure you:  Wash your hands with soap and water for at least 20 seconds before and after you change your bandage (dressing). If you cannot use soap and water, use hand sanitizer.  Change your bandage as told by your doctor. ? Feel for a slight trembling over the access site (thrill) every day. If you feel trembling, the access is working. ? Check your access site every day for signs of infection. Check for:  Redness, swelling, or pain.  Fluid or blood.  Warmth.  Pus or a bad smell. Activity  Rest as told by your doctor.  Return to your normal activities as told by your doctor. Ask your doctor what activities are safe for you. Managing trouble pooping Your condition may cause trouble pooping (constipation). To prevent or treat trouble pooping, you may need to:  Take over-the-counter or prescription medicines.  Eat foods that are high in fiber, such as: ? Beans. ? Whole grains. ? Fresh fruits. ? Fresh vegetables.  Limit foods that are high in fat and sugar. These include fried or sweet foods. Alcohol use  Do not drink alcohol if: ? Your doctor tells you not to drink. ? You are pregnant, may be pregnant, or are planning to become pregnant.  If you drink alcohol: ? Limit how much you use to:  0-1 drink a day for women.  0-2 drinks a day for men. ? Be aware of how much alcohol is in your drink. In the [place] one drink equals one 12 oz bottle of beer (355 mL), one 5 oz glass of wine (148 mL), or one 1 oz glass of hard liquor (44 mL). General instructions   Take over-the-counter and prescription medicines only as told by your doctor. This includes vitamin and mineral supplements. Talk with your doctor  before taking new supplements.  Do not take baths, swim, or use a hot tub until your doctor approves. Ask your doctor if you may take showers. You may only be allowed to take sponge baths.  Carry an item that shows you get dialysis. The item may be a wallet card, bracelet, or medical identification tag. Always keep the item with you. This helps if an accident or medical emergency happens. If you cannot speak, the item will let doctors know about your condition.  Keep all dialysis visits as told by your doctor. Dialysis sessions should be scheduled regularly. Do not skip a session. Contact a doctor if:  You have a fever.  You have chills.  You have any of these signs of infection at or near your access site: ?  Warmth. ? Redness, swelling, or pain. ? Fluid or blood. ? Pus or a bad smell.  You find a pimple on your access site. Get help right away if:  You have symptoms at the access site on your arm, such as: ? The hand on the side of your body where you had surgery gets painful, loses feeling (gets numb), or gets very pale. ? No trembling at the access site.  You get dizzy or weak, and you did not have this before.  You get short of breath or get chest pain.  You have bleeding at your access site that is hard to control.  You get mixed up (confused), or you do not know the time of day, where you are, or who you are.  You get blurred vision.  You have a seizure. These symptoms may be an emergency. Do not wait to see if the symptoms will go away. Get medical help right away. Call your local emergency services (911 in the [place] Do not drive yourself to the hospital. Summary  After the procedure, it is common to feel tired, feel itchy, and have achy or jumpy legs.  Track how much fluid you drink, and limit how much of some minerals you are getting. Eat high-quality proteins, and take supplements as told.  Take good care of your access site. Tell your doctor if you have  problems. These may include warmth, redness, swelling, fluid, blood, pus, or a bad smell. This information is not intended to replace advice given to you by your health care provider. Make sure you discuss any questions you have with your health care provider. Document Revised: 10/03/2018 Document Reviewed: 10/03/2018 Elsevier Patient Education  2020 [employer]      Agustina Caroli, MD [facility]

## 2019-07-08 ENCOUNTER — Ambulatory Visit: Payer: BC Managed Care – PPO | Admitting: Internal Medicine

## 2019-07-23 DIAGNOSIS — R9430 Abnormal result of cardiovascular function study, unspecified: Secondary | ICD-10-CM | POA: Insufficient documentation

## 2019-07-23 DIAGNOSIS — E669 Obesity, unspecified: Secondary | ICD-10-CM | POA: Insufficient documentation

## 2019-07-23 DIAGNOSIS — Z905 Acquired absence of kidney: Secondary | ICD-10-CM | POA: Insufficient documentation

## 2019-07-23 DIAGNOSIS — Z01818 Encounter for other preprocedural examination: Secondary | ICD-10-CM | POA: Insufficient documentation

## 2019-07-23 DIAGNOSIS — R931 Abnormal findings on diagnostic imaging of heart and coronary circulation: Secondary | ICD-10-CM | POA: Insufficient documentation

## 2019-08-20 ENCOUNTER — Other Ambulatory Visit: Payer: Self-pay

## 2019-08-20 ENCOUNTER — Ambulatory Visit (INDEPENDENT_AMBULATORY_CARE_PROVIDER_SITE_OTHER): Payer: Medicare Other | Admitting: Physician Assistant

## 2019-08-20 VITALS — BP 109/64 | HR 83 | Temp 98.1°F | Resp 20 | Ht 69.0 in | Wt 236.0 lb

## 2019-08-20 DIAGNOSIS — Z992 Dependence on renal dialysis: Secondary | ICD-10-CM | POA: Diagnosis not present

## 2019-08-20 DIAGNOSIS — N186 End stage renal disease: Secondary | ICD-10-CM | POA: Diagnosis not present

## 2019-08-20 NOTE — Progress Notes (Signed)
HISTORY AND PHYSICAL     CC:  dialysis access Requesting Provider:  Horald Pollen, *  HPI: This is a 54 y.o. male here for evaluation of his hemodialysis access.  He has a left arm basilic vein transposition on 01/04/2019 and the 2nd stage on 03/11/2019 by Dr. Carlis Abbott.   He states that his left forearm is different from his right forearm.  He states it feels like a balloon inside.  He states that when he puts it down on an armrest, that it is different from the right side.  This has been present since his 1st stage BVT in January.  He says the fistula is working well.  He uses numbing cream.  He says when he started dialysis back in March, he would feel good after HD, but now he feels very tired and just wants to go home and sleep.  He does not have any pain in his left hand.   The pt is right hand dominant.    Pt is on dialysis.   Days of dialysis if applicable:  T/T/S    HD center if applicable:  [facility] location.   The pt is not on a statin for cholesterol management.  The pt is not on a daily aspirin.  Other AC:  none The pt is on BB, CCB for hypertension.  The pt is not diabetic.   Tobacco hx:  never  Past Medical History:  Diagnosis Date  . Anemia   . Back pain   . CKD (chronic kidney disease), stage III    Stage 4  . Colon polyps    adenomatous  . Elevated cholesterol   . History of echocardiogram    Echo 11/17: EF 65-70, normal wall motion, grade 1 diastolic dysfunction, trivial MR, mild LAE, mild TR  . Hyperlipidemia   . Hypertension    no current bp meds for last 3 months  . Kidney stones 2007  . Medication intolerance    a. multiple with prior nonadherence to regimen.  . Nausea    " in the morning since having the kidney problem "  . Otosclerosis of both ears   . Pneumonia   . PONV (postoperative nausea and vomiting)   . PUD (peptic ulcer disease)    Has had unspecified surgery for this  . Sleep apnea     Past Surgical History:  Procedure  Laterality Date  . AV FISTULA PLACEMENT Left 01/04/2019   Procedure: ARTERIOVENOUS (AV) FISTULA CREATION LEFT ARM;  Surgeon: [name], MD;  Location: [place];  Service: Vascular;  Laterality: Left;  . BASCILIC VEIN TRANSPOSITION Left 03/11/2019   Procedure: BASILIC VEIN TRANSPOSITION 2ND STAGE LEFT;  Surgeon: [name], MD;  Location: [place];  Service: Vascular;  Laterality: Left;  . NEPHRECTOMY  02/18/2011   Procedure: NEPHRECTOMY;  Surgeon: Hanley Ben, MD;  Location: [facility];  Service: Urology;  Laterality: Right;  . SMALL INTESTINE SURGERY    . STAPEDOTOMY  2005   lt ear jan, right ear sept  . surgery for ulcers  1990    Allergies  Allergen Reactions  . Lokelma [Sodium Zirconium Cyclosilicate] Shortness Of Breath  . Beef-Derived Products Other (See Comments)    Cultural preference  . Pegademase Bovine Other (See Comments)    Cultural preference  . Poractant Alfa Other (See Comments)    Cultural preference  . Pork-Derived Products Other (See Comments)    Cultural preference    Current Outpatient Medications  Medication  Sig Dispense Refill  . amLODipine (NORVASC) 5 MG tablet Take 1 tablet (5 mg total) by mouth daily. 30 tablet 0  . benzoyl peroxide (BENZOYL PEROXIDE) 5 % external liquid Apply topically 2 (two) times daily. 142 g 12  . calcium acetate (PHOSLO) 667 MG capsule Take 2 capsules (1,334 mg total) by mouth 3 (three) times daily with meals. (Patient taking differently: Take 2,001 mg by mouth 3 (three) times daily with meals. ) 180 capsule 3  . carvedilol (COREG) 25 MG tablet Take 1 tablet (25 mg total) by mouth 2 (two) times daily with a meal. 60 tablet 0  . hydrALAZINE (APRESOLINE) 25 MG tablet Take 1 tablet (25 mg total) by mouth every 8 (eight) hours. 90 tablet 0  . pantoprazole (PROTONIX) 40 MG tablet Take 1 tablet (40 mg total) by mouth daily. 30 tablet 5   No current facility-administered medications for this visit.    Family History  Problem  Relation Age of Onset  . Hyperlipidemia Father   . Heart disease Mother   . Hypertension Mother   . Hypertension Sister   . Heart disease Brother 87       CABG  . Hyperlipidemia Sister   . Hyperlipidemia Sister   . Esophageal cancer Cousin   . Healthy Child   . Colon cancer Neg Hx   . Stomach cancer Neg Hx   . Rectal cancer Neg Hx     Social History   Socioeconomic History  . Marital status: Married    Spouse name: Edgardo Roys  . Number of children: 5  . Years of education: Not on file  . Highest education level: 12th grade  Occupational History    Employer: [employer]  Tobacco Use  . Smoking status: Never Smoker  . Smokeless tobacco: Never Used  Vaping Use  . Vaping Use: Never used  Substance and Sexual Activity  . Alcohol use: No  . Drug use: No  . Sexual activity: Yes    Birth control/protection: None  Other Topics Concern  . Not on file  Social History Narrative   Lives at home with wife and family. He is from [place]. Came to the [place] in 2002.      Patient is right-handed. He lives in a one level home. He drinks 1-2 cups of coffee and tea a day. He does not exercise.   Social Determinants of Health   Financial Resource Strain: Low Risk   . Difficulty of Paying Living Expenses: Not hard at all  Food Insecurity: No Food Insecurity  . Worried About Charity fundraiser in the Last Year: Never true  . Ran Out of Food in the Last Year: Never true  Transportation Needs: No Transportation Needs  . Lack of Transportation (Medical): No  . Lack of Transportation (Non-Medical): No  Physical Activity: Inactive  . Days of Exercise per Week: 0 days  . Minutes of Exercise per Session: 0 min  Stress: No Stress Concern Present  . Feeling of Stress : Not at all  Social Connections: Moderately Integrated  . Frequency of Communication with Friends and Family: More than three times a week  . Frequency of Social Gatherings with Friends and Family: More than three times a week    . Attends Religious Services: More than 4 times per year  . Active Member of Clubs or Organizations: No  . Attends Archivist Meetings: Never  . Marital Status: Married  Human resources officer Violence: Not At Risk  . Fear  of Current or Ex-Partner: No  . Emotionally Abused: No  . Physically Abused: No  . Sexually Abused: No     ROS: [x]  Positive   [ ]  Negative   [ ]  All sytems reviewed and are negative  Cardiac: [x]  high blood pressure   Vascular: [x]  numbness in left forearm; denies pain in left hand  Pulmonary: []  asthma []  wheezing  Neurologic: [x]  lumbar pain   Hematologic: []  bleeding problems  GI [x]  GERD  GU: [x]  CKD/renal failure  [x]  HD---[]  M/W/F [x]  T/T/S [x]  hx BPH  Psychiatric: []  hx of major depression  Integumentary: []  rashes []  ulcers  Constitutional: []  fever []  chills  PHYSICAL EXAMINATION:  Today's Vitals   08/20/19 1355  BP: 109/64  Pulse: 83  Resp: 20  Temp: 98.1 F (36.7 C)  TempSrc: Temporal  SpO2: 98%  Weight: 236 lb (107 kg)  Height: 5\' 9"  (1.753 m)   Body mass index is 34.85 kg/m.   General:  WDWN male in NAD Gait: Not observed HENT: WNL Pulmonary: normal non-labored breathing , without Rales, rhonchi,  wheezing Cardiac: regular, without  Murmur without carotid bruits Abdomen: obsee Skin: well healed scars left upper arm Vascular Exam/Pulses:   Right Left  Radial 2+ (normal) 2+ (normal)  Ulnar Unable to palpate  Unable to palpate    Extremities:  Left hand grip is 5/5 with motor and sensation in tact; there is an excellent thrill within the fistula.  Bilateral forearms are symmetrical without swelling.   Musculoskeletal: no muscle wasting or atrophy  Neurologic: A&O X 3; Speech is fluent/normal  Non-Invasive Vascular Imaging:   None today  ASSESSMENT/PLAN: 54 y.o. male with ESRD here for evaluation for forearm numbness of his left arm.  He has hx of left arm BVT   -pt's fistula is working well  without evidence of steal and has 5/5 hand grip with motor and sensory in tact left hand.   -he has had some numbness in the left forearm since his first stage BVT in November.  There is no swelling present and bilateral forearms are symmetrical.  Most likely due to nerve irritation.  Discussed with pt that this may or may not get better with time but hopefully with have some improvement over time.  -he will follow up as needed.     Leontine Locket, [facility] [phone]  Clinic MD:   Carlis Abbott

## 2019-09-02 ENCOUNTER — Encounter: Payer: Self-pay | Admitting: Internal Medicine

## 2019-09-02 ENCOUNTER — Ambulatory Visit (INDEPENDENT_AMBULATORY_CARE_PROVIDER_SITE_OTHER): Payer: Medicare Other | Admitting: Internal Medicine

## 2019-09-02 ENCOUNTER — Other Ambulatory Visit: Payer: Self-pay

## 2019-09-02 VITALS — BP 140/74 | HR 73 | Temp 97.0°F | Ht 69.0 in | Wt 241.4 lb

## 2019-09-02 DIAGNOSIS — N186 End stage renal disease: Secondary | ICD-10-CM | POA: Diagnosis not present

## 2019-09-02 DIAGNOSIS — E781 Pure hyperglyceridemia: Secondary | ICD-10-CM | POA: Diagnosis not present

## 2019-09-02 DIAGNOSIS — Z789 Other specified health status: Secondary | ICD-10-CM | POA: Diagnosis not present

## 2019-09-02 DIAGNOSIS — Z992 Dependence on renal dialysis: Secondary | ICD-10-CM

## 2019-09-02 MED ORDER — ICOSAPENT ETHYL 1 G PO CAPS
2.0000 g | ORAL_CAPSULE | Freq: Two times a day (BID) | ORAL | 11 refills | Status: DC
Start: 2019-09-02 — End: 2019-09-18

## 2019-09-02 NOTE — Progress Notes (Signed)
LIPID CLINIC CONSULT NOTE  Chief Complaint:  Follow-up hyperchylomicronemia  Primary Care Physician: Horald Pollen, MD  Primary Cardiologist:  Ena Dawley, MD  HPI:  [name] is a 54 y.o. male who is being seen today for the evaluation of dyslipidemia at the request of Andrew Spark, MD.  This is a pleasant 54 year old Arabic male who was seen today with a Psychologist, sport and exercise.  His past medical history significant for chronic kidney disease which is stage IV and currently a GFR of around 10.  He has been followed by Dr. Justin Barnett with nephrology and has an appointment in October to see Dr. Carlis Barnett with vascular surgery for creation of a fistula.  He has had longstanding dyslipidemia and multiple medication intolerances.  He was previously followed in the [facility] lipid clinic by [name], PharmD.  When he was last seen in November 2019, he was on rosuvastatin 10 mg daily and Lovaza 2 g twice daily.  He was reportedly intolerance to atorvastatin 80 mg causing myalgias and fenofibrate 160 mg causing myalgias.  I reviewed the medicine list with him today and looked at all the medications that were in his possession.  After working with the translator we determined that he was still taking the rosuvastatin 10 mg daily and also fenofibrate 160 mg daily.  He did not have any of the Lovaza.  Previously he was also evaluated apparently for Vascepa but felt to be unable to obtain that due to financial constraints.  He is working and does have [employer] but is in application for disability.  There is a strong family history of coronary disease in multiple family members.  His only other complaint today is some shortness of breath and next edema.  Is difficult to know whether shortness of breath may be related to advanced chronic kidney disease or coronary disease.  Given his advanced creatinine, it is unlikely that he could have any dye related cardiovascular work-up.  His last  stress test was in 2015.  His most recent lipids from August 24, 2018 showed total cholesterol 313, triglycerides 1340, HDL 24 and direct LDL of 63.  03/12/2019  Mr. Mendizabal returns today for follow-up of hypertriglyceridemia.  He has been taking Vascepa 2 g twice daily.  This has caused a resultant decrease in his triglycerides from 1340-741.  Although this is still elevated, represents a significant reduction.  It is difficult to get his triglycerides much lower given his underlying renal disease.  Unfortunately he was not a candidate for a fibrate.  He reports other medication intolerances.  With this medicine he has says through the translator that he thinks he is having headaches and possibly some abdominal pain.  I advised him to decrease the dose to 1 g twice daily, although not as effective, it may have less chance for side effect.  If this is better tolerated he should continue it and if not then he will have no choice but to stop it.  I do not see any other options for lowering triglycerides at this time other than strict diet a very low saturated fats less than 5 to 10% of calories.  I provided him with this information today including dietary recommendations.  09/02/2019  Mr. [name] was seen today for follow-up with a professional Fish farm manager via the [employer]..  He had marked improvement in his triglycerides on Vascepa however unfortunately had progressive renal dysfunction and ultimately has gone on dialysis.  Prior to that  he was having headaches and other symptoms which I believe are related to end-stage renal disease however he felt it could be related to the Vascepa and discontinue the medicine.  Not surprisingly his triglycerides have accordingly increased.  At one point triglycerides were well over thousand and had come down to 305 but recently went up to 607.  PMHx:  Past Medical History:  Diagnosis Date  . Anemia   . Back pain   . CKD (chronic kidney disease), stage  III    Stage 4  . Colon polyps    adenomatous  . Elevated cholesterol   . History of echocardiogram    Echo 11/17: EF 65-70, normal wall motion, grade 1 diastolic dysfunction, trivial MR, mild LAE, mild TR  . Hyperlipidemia   . Hypertension    no current bp meds for last 3 months  . Kidney stones 2007  . Medication intolerance    a. multiple with prior nonadherence to regimen.  . Nausea    " in the morning since having the kidney problem "  . Otosclerosis of both ears   . Pneumonia   . PONV (postoperative nausea and vomiting)   . PUD (peptic ulcer disease)    Has had unspecified surgery for this  . Sleep apnea     Past Surgical History:  Procedure Laterality Date  . AV FISTULA PLACEMENT Left 01/04/2019   Procedure: ARTERIOVENOUS (AV) FISTULA CREATION LEFT ARM;  Surgeon: [name], MD;  Location: [place];  Service: Vascular;  Laterality: Left;  . BASCILIC VEIN TRANSPOSITION Left 03/11/2019   Procedure: BASILIC VEIN TRANSPOSITION 2ND STAGE LEFT;  Surgeon: [name], MD;  Location: [place];  Service: Vascular;  Laterality: Left;  . NEPHRECTOMY  02/18/2011   Procedure: NEPHRECTOMY;  Surgeon: Hanley Ben, MD;  Location: [facility];  Service: Urology;  Laterality: Right;  . SMALL INTESTINE SURGERY    . STAPEDOTOMY  2005   lt ear jan, right ear sept  . surgery for ulcers  1990    FAMHx:  Family History  Problem Relation Age of Onset  . Hyperlipidemia Father   . Heart disease Mother   . Hypertension Mother   . Hypertension Sister   . Heart disease Brother 53       CABG  . Hyperlipidemia Sister   . Hyperlipidemia Sister   . Esophageal cancer Cousin   . Healthy Child   . Colon cancer Neg Hx   . Stomach cancer Neg Hx   . Rectal cancer Neg Hx     SOCHx:   reports that he has never smoked. He has never used smokeless tobacco. He reports that he does not drink alcohol and does not use drugs.  ALLERGIES:  Allergies  Allergen Reactions  . Lokelma [Sodium  Zirconium Cyclosilicate] Shortness Of Breath  . Beef-Derived Products Other (See Comments)    Cultural preference  . Pegademase Bovine Other (See Comments)    Cultural preference  . Poractant Alfa Other (See Comments)    Cultural preference  . Pork-Derived Products Other (See Comments)    Cultural preference    ROS: Pertinent items noted in HPI and remainder of comprehensive ROS otherwise negative.  HOME MEDS: Current Outpatient Medications on File Prior to Visit  Medication Sig Dispense Refill  . acetaminophen (TYLENOL) 325 MG tablet Take by mouth.    . benzoyl peroxide (BENZOYL PEROXIDE) 5 % external liquid Apply topically 2 (two) times daily. 142 g 12  . Betamethasone Sodium Phosphate 6  MG/ML SOLN INJECT 2:4 SYRINGE BILATERALLY INTO EACH KNEE JOINT    . calcium acetate (PHOSLO) 667 MG capsule Take 2 capsules (1,334 mg total) by mouth 3 (three) times daily with meals. (Patient taking differently: Take 2,001 mg by mouth 3 (three) times daily with meals. ) 180 capsule 3  . iron sucrose in sodium chloride 0.9 % 100 mL Iron Sucrose (Venofer)    . lidocaine-prilocaine (EMLA) cream Apply topically.    . Methoxy PEG-Epoetin Beta (MIRCERA IJ) Mircera    . pantoprazole (PROTONIX) 40 MG tablet Take 1 tablet (40 mg total) by mouth daily. 30 tablet 5  . VITAMIN D PO Take by mouth.    Marland Kitchen amLODipine (NORVASC) 5 MG tablet Take 1 tablet (5 mg total) by mouth daily. 30 tablet 0  . carvedilol (COREG) 25 MG tablet Take 1 tablet (25 mg total) by mouth 2 (two) times daily with a meal. 60 tablet 0  . hydrALAZINE (APRESOLINE) 25 MG tablet Take 1 tablet (25 mg total) by mouth every 8 (eight) hours. 90 tablet 0   No current facility-administered medications on file prior to visit.    LABS/IMAGING: No results found for this or any previous visit (from the past 48 hour(s)). No results found.  LIPID PANEL:    Component Value Date/Time   CHOL 274 (H) 06/10/2019 1356   TRIG 607 (HH) 06/10/2019 1356    HDL 28 (L) 06/10/2019 1356   CHOLHDL 9.8 (H) 06/10/2019 1356   CHOLHDL 8.5 04/13/2019 0450   VLDL 61 (H) 04/13/2019 0450   LDLCALC 133 (H) 06/10/2019 1356   LDLDIRECT 63 08/24/2018 1458   LDLDIRECT 139.0 05/06/2014 1209    WEIGHTS: Wt Readings from Last 3 Encounters:  09/02/19 241 lb 6.4 oz (109.5 kg)  08/20/19 236 lb (107 kg)  07/01/19 241 lb (109.3 kg)    VITALS: BP 140/74   Pulse 73   Temp (!) 97 F (36.1 C)   Ht 5\' 9"  (1.753 m)   Wt 241 lb 6.4 oz (109.5 kg)   SpO2 97%   BMI 35.65 kg/m   EXAM: Deferred  EKG: Deferred  ASSESSMENT: 1. Secondary hypertriglyceridemia secondary to advanced kidney disease 2. Progressive dyspnea on exertion, ? coronary equivalent 3. ESRD-now on dialysis  PLAN: 1.   Mr. Abshier had an excellent response to Vascepa, but stopped taking the medicine due to perceived side effects however I think it was related to his advanced kidney disease.  He is now on dialysis and his symptoms have for the most part resolved.  I would like for him to retry Vascepa as I think that he will tolerate it well and would benefit from the triglyceride lowering aspects of the medication as well as its' antioxidant effects.  Repeat lipids in 3 months and follow-up with me afterwards.  Pixie Casino, MD, [facility], [place] Director of the Advanced Lipid Disorders &  Cardiovascular Risk Reduction Clinic Diplomate of the American Board of Clinical Lipidology Attending Cardiologist  Direct Dial: [phone]  Fax: [phone]  Website:  [email or web address] Cassian Torelli 09/02/2019, 4:45 PM

## 2019-09-02 NOTE — Patient Instructions (Signed)
Medication Instructions:  Dr. Debara Pickett recommends that you restart VASCEPA 2 capsules twice daily   *If you need a refill on your cardiac medications before your next appointment, please call your pharmacy*   Lab Work: FASTING lipid panel in 3 months at Dr. Lysbeth Penner office  If you have labs (blood work) drawn today and your tests are completely normal, you will receive your results only by: Marland Kitchen MyChart Message (if you have MyChart) OR . A paper copy in the mail If you have any lab test that is abnormal or we need to change your treatment, we will call you to review the results.   Testing/Procedures: NONE   Follow-Up: At [facility], you and your health needs are our priority.  As part of our continuing mission to provide you with exceptional heart care, we have created designated Provider Care Teams.  These Care Teams include your primary Cardiologist (physician) and Advanced Practice Providers (APPs -  Physician Assistants and Nurse Practitioners) who all work together to provide you with the care you need, when you need it.  We recommend signing up for the patient portal called "MyChart".  Sign up information is provided on this After Visit Summary.  MyChart is used to connect with patients for Virtual Visits (Telemedicine).  Patients are able to view lab/test results, encounter notes, upcoming appointments, etc.  Non-urgent messages can be sent to your provider as well.   To learn more about what you can do with MyChart, go to [email or web address].    Your next appointment:   3 month(s) - lipid clinic  The format for your next appointment:   In Person  Provider:   K. Mali Hilty, MD   Other Instructions

## 2019-09-04 ENCOUNTER — Encounter: Payer: Self-pay | Admitting: Podiatry

## 2019-09-04 ENCOUNTER — Ambulatory Visit (INDEPENDENT_AMBULATORY_CARE_PROVIDER_SITE_OTHER): Payer: Medicare Other

## 2019-09-04 ENCOUNTER — Ambulatory Visit (INDEPENDENT_AMBULATORY_CARE_PROVIDER_SITE_OTHER): Payer: Medicare Other | Admitting: Podiatry

## 2019-09-04 ENCOUNTER — Other Ambulatory Visit: Payer: Self-pay

## 2019-09-04 DIAGNOSIS — M779 Enthesopathy, unspecified: Secondary | ICD-10-CM

## 2019-09-09 NOTE — Progress Notes (Signed)
Subjective:   Patient ID: [name], male   DOB: 54 y.o.   MRN: [id number]   HPI Patient states he developed a lot of pain in his ankle and states he does not remember specific injury.  States it is gradually become more of an issue for him and he is trying to work but it is been hard due to the pain.  Patient does not smoke and does like to be active if possible   Review of Systems  All other systems reviewed and are negative.       Objective:  Physical Exam Vitals and nursing note reviewed.  Constitutional:      Appearance: He is well-developed.  Pulmonary:     Effort: Pulmonary effort is normal.  Musculoskeletal:        General: Normal range of motion.  Skin:    General: Skin is warm.  Neurological:     Mental Status: He is alert.     Neurovascular status found to be intact muscle strength was found to be adequate with patient found to have diminished range of motion of the subtalar joint left where it appears that he is splinting for pain.  Quite a bit of discomfort noted sinus tarsi with fluid buildup with moderate flatfoot deformity also noted and good digital perfusion well oriented x3     Assessment:  Probability for acute sinus tarsitis with inflammation of the sinus tarsi joint left     Plan:  H&P condition reviewed and today I did sterile prep and injected the sinus tarsi 3 mg Kenalog 5 mg Xylocaine discussed with him topical medicines oral medication support shoes and different exercises to do.  Patient will be seen back as needed and may require more advanced type imaging depending on how he does  X-rays were negative for signs of fracture negative for signs of advanced arthritis

## 2019-09-18 ENCOUNTER — Telehealth: Payer: Self-pay | Admitting: Internal Medicine

## 2019-09-18 MED ORDER — ICOSAPENT ETHYL 1 G PO CAPS: 2.0000 g | ORAL_CAPSULE | Freq: Two times a day (BID) | ORAL | 11 refills | Status: DC

## 2019-09-18 NOTE — Telephone Encounter (Signed)
-----   Message from Claudie Fisherman, [place] sent at 09/13/2019  5:33 PM EDT ----- Regarding: Vascepa The patient is requesting help to get his vascepa refilled again. His insurance doesn't cover the cost and he can not afford this med. Please give him a call as he is requesting change of his med for something he can afford.

## 2019-09-18 NOTE — Telephone Encounter (Signed)
PA required for Vascepa per [facility]. No notice of this had been received yet. They generated PA request via [email or web address]  PA submitted (Key: [id number]) Approved: Effective from 09/18/2019 through 09/16/2020

## 2019-10-31 ENCOUNTER — Other Ambulatory Visit: Payer: Self-pay

## 2019-10-31 ENCOUNTER — Emergency Department (HOSPITAL_COMMUNITY)
Admission: EM | Admit: 2019-10-31 | Discharge: 2019-11-01 | Disposition: A | Discharge status: Home / Self Care | Payer: Medicare Other | Attending: Emergency Medicine | Admitting: Emergency Medicine

## 2019-10-31 ENCOUNTER — Emergency Department (HOSPITAL_COMMUNITY): Payer: Medicare Other

## 2019-10-31 DIAGNOSIS — U071 COVID-19: Secondary | ICD-10-CM | POA: Diagnosis not present

## 2019-10-31 DIAGNOSIS — I130 Hypertensive heart and chronic kidney disease with heart failure and stage 1 through stage 4 chronic kidney disease, or unspecified chronic kidney disease: Secondary | ICD-10-CM | POA: Diagnosis not present

## 2019-10-31 DIAGNOSIS — Z1152 Encounter for screening for COVID-19: Secondary | ICD-10-CM | POA: Insufficient documentation

## 2019-10-31 DIAGNOSIS — R509 Fever, unspecified: Secondary | ICD-10-CM | POA: Diagnosis present

## 2019-10-31 DIAGNOSIS — I5022 Chronic systolic (congestive) heart failure: Secondary | ICD-10-CM | POA: Diagnosis not present

## 2019-10-31 DIAGNOSIS — N184 Chronic kidney disease, stage 4 (severe): Secondary | ICD-10-CM | POA: Diagnosis not present

## 2019-10-31 DIAGNOSIS — Z86010 Personal history of colonic polyps: Secondary | ICD-10-CM | POA: Diagnosis not present

## 2019-10-31 DIAGNOSIS — Z79899 Other long term (current) drug therapy: Secondary | ICD-10-CM | POA: Diagnosis not present

## 2019-10-31 LAB — COMPREHENSIVE METABOLIC PANEL
ALT: 19 U/L (ref 0–44)
AST: 15 U/L (ref 15–41)
Albumin: 3.8 g/dL (ref 3.5–5.0)
Alkaline Phosphatase: 46 U/L (ref 38–126)
Anion gap: 15 (ref 5–15)
BUN: 46 mg/dL — ABNORMAL HIGH (ref 6–20)
CO2: 25 mmol/L (ref 22–32)
Calcium: 8.4 mg/dL — ABNORMAL LOW (ref 8.9–10.3)
Chloride: 94 mmol/L — ABNORMAL LOW (ref 98–111)
Creatinine, Ser: 12.23 mg/dL — ABNORMAL HIGH (ref 0.61–1.24)
GFR calc Af Amer: 5 mL/min — ABNORMAL LOW (ref >60)
GFR calc non Af Amer: 4 mL/min — ABNORMAL LOW (ref >60)
Glucose, Bld: 102 mg/dL — ABNORMAL HIGH (ref 70–99)
Potassium: 4.4 mmol/L (ref 3.5–5.1)
Sodium: 134 mmol/L — ABNORMAL LOW (ref 135–145)
Total Bilirubin: 0.7 mg/dL (ref 0.3–1.2)
Total Protein: 7 g/dL (ref 6.5–8.1)

## 2019-10-31 LAB — APTT: aPTT: 48 seconds — ABNORMAL HIGH (ref 24–36)

## 2019-10-31 LAB — CBC WITH DIFFERENTIAL/PLATELET
Abs Immature Granulocytes: 0.02 10*3/uL (ref 0.00–0.07)
Basophils Absolute: 0 10*3/uL (ref 0.0–0.1)
Basophils Relative: 0 %
Eosinophils Absolute: 0 10*3/uL (ref 0.0–0.5)
Eosinophils Relative: 0 %
HCT: 32.2 % — ABNORMAL LOW (ref 39.0–52.0)
Hemoglobin: 10.3 g/dL — ABNORMAL LOW (ref 13.0–17.0)
Immature Granulocytes: 1 %
Lymphocytes Relative: 19 %
Lymphs Abs: 0.7 10*3/uL (ref 0.7–4.0)
MCH: 28.9 pg (ref 26.0–34.0)
MCHC: 32 g/dL (ref 30.0–36.0)
MCV: 90.4 fL (ref 80.0–100.0)
Monocytes Absolute: 0.4 10*3/uL (ref 0.1–1.0)
Monocytes Relative: 12 %
Neutro Abs: 2.5 10*3/uL (ref 1.7–7.7)
Neutrophils Relative %: 68 %
Platelets: 115 10*3/uL — ABNORMAL LOW (ref 150–400)
RBC: 3.56 MIL/uL — ABNORMAL LOW (ref 4.22–5.81)
RDW: 14 % (ref 11.5–15.5)
WBC: 3.6 10*3/uL — ABNORMAL LOW (ref 4.0–10.5)
nRBC: 0 % (ref 0.0–0.2)

## 2019-10-31 LAB — PROTIME-INR
INR: 1 (ref 0.8–1.2)
Prothrombin Time: 12.6 seconds (ref 11.4–15.2)

## 2019-10-31 LAB — SARS CORONAVIRUS 2 BY RT PCR (HOSPITAL ORDER, PERFORMED IN ~~LOC~~ HOSPITAL LAB): SARS Coronavirus 2: POSITIVE — AB

## 2019-10-31 LAB — LACTIC ACID, PLASMA: Lactic Acid, Venous: 0.8 mmol/L (ref 0.5–1.9)

## 2019-10-31 MED ORDER — ACETAMINOPHEN 325 MG PO TABS
650.0000 mg | ORAL_TABLET | Freq: Once | ORAL | Status: AC
  Administered 2019-11-01: 650 mg via ORAL
  Filled 2019-10-31: qty 2

## 2019-10-31 MED ORDER — ONDANSETRON 4 MG PO TBDP
4.0000 mg | ORAL_TABLET | Freq: Once | ORAL | Status: AC
  Administered 2019-11-01: 4 mg via ORAL
  Filled 2019-10-31: qty 1

## 2019-10-31 NOTE — ED Triage Notes (Signed)
Emergency Medicine Provider Triage Evaluation Note  [name] , a 54 y.o. male  was evaluated in triage.  Pt complains of flu like sxs. Hx of ESRD- t/th/sat. Sent here without getting dialysis due to fever. C/o cough. Diarrhea chills, body aches, n/v. No covid vax  Review of Systems  Positive: Nausea vomiting diarrhea fevers chills and cough Negative: Shortness of breath  Physical Exam  BP (!) 141/73   Pulse 88   Temp 100.2 F (37.9 C) (Oral)   Resp 16   SpO2 97%  Gen:   Awake, no distress   HEENT:  Atraumatic  Resp:  Normal effort  Cardiac:  Normal rate  Abd:   Nondistended, nontender  MSK:   Moves extremities without difficulty  Neuro:  Speech clear   Medical Decision Making  Medically screening exam initiated at 2:56 PM.  Appropriate orders placed.  Cher Egnor was informed that the remainder of the evaluation will be completed by another provider, this initial triage assessment does not replace that evaluation, and the importance of remaining in the ED until their evaluation is complete.  Clinical Impression  Patient with flulike symptoms.  Have ordered a Covid PCR, labs.  He is stable with elevated temperature.  I have ordered Tylenol for fever defervesced since.   Margarita Mail, PA-C 10/31/19 1505

## 2019-10-31 NOTE — ED Triage Notes (Signed)
Pt here from his dialysis that he did not get done due to fever/ h/a and cough

## 2019-11-01 MED ORDER — DEXAMETHASONE SODIUM PHOSPHATE 10 MG/ML IJ SOLN
10.0000 mg | Freq: Once | INTRAMUSCULAR | Status: AC
  Administered 2019-11-01: 10 mg via INTRAMUSCULAR
  Filled 2019-11-01: qty 1

## 2019-11-01 MED ORDER — DIPHENOXYLATE-ATROPINE 2.5-0.025 MG PO TABS
1.0000 | ORAL_TABLET | Freq: Once | ORAL | Status: AC
  Administered 2019-11-01: 1 via ORAL
  Filled 2019-11-01: qty 1

## 2019-11-01 NOTE — Discharge Instructions (Addendum)
You have been referred to our infusion center to receive Monoclonal Antibody therapy, which is a type of treatment for COVID-19 infection. You should receive receive a call from the infusion team today to schedule your appointment. They will likely schedule you for later this afternoon (Friday). They will give you all of the information and instructions regarding the infusion when they call you.   Please contact your dialysis team as soon as possible to coordinate your make-up dialysis session with your monoclonal antibody infusion treatment for your COVID-19 infection.   You may continue tylenol at home for management of your symptoms.

## 2019-11-01 NOTE — ED Notes (Signed)
Pt verbalized understanding of d/c instructions, follow up and medications.  Pt to [facility] via [facility].

## 2019-11-01 NOTE — ED Provider Notes (Signed)
[facility] EMERGENCY DEPARTMENT Provider Note   CSN: [id number] Arrival date & time: 10/31/19  1432     History No chief complaint on file.   [name] is a 54 y.o. male with presenting today from dialysis center with concern of three days of fever, headache, cough, muscle aches, nausea, diarrhea, and loss of taste.  He states he is having a loose nonbloody bowel movement approximately every 3 hours, which has interrupted his sleep for the past few days. He is not eating or drinking as much as normal due to loss of taset. He reports several members of his family had COVID-81 last month. He has not been vaccinated against COVID-19.   [name] has a history of ESRD requiring 3x weekly dialysis. Recently reevaluated for transplant at [facility] and found not to be a candidate. He presented for dialysis today, but was sent here without undergoing dialysis as he was febrile upon arrival to the dialysis center.     Additionally, he has a past medical history significant for congestive heart failure, dilated cardiomyopathy, HTN, and hyperlipidemia.  HPI     Past Medical History:  Diagnosis Date  . Anemia   . Back pain   . CKD (chronic kidney disease), stage III    Stage 4  . Colon polyps    adenomatous  . Elevated cholesterol   . History of echocardiogram    Echo 11/17: EF 65-70, normal wall motion, grade 1 diastolic dysfunction, trivial MR, mild LAE, mild TR  . Hyperlipidemia   . Hypertension    no current bp meds for last 3 months  . Kidney stones 2007  . Medication intolerance    a. multiple with prior nonadherence to regimen.  . Nausea    " in the morning since having the kidney problem "  . Otosclerosis of both ears   . Pneumonia   . PONV (postoperative nausea and vomiting)   . PUD (peptic ulcer disease)    Has had unspecified surgery for this  . Sleep apnea     Patient Active Problem List   Diagnosis Date Noted  . Hypertensive heart disease with  chronic systolic congestive heart failure ([place]) 07/01/2019  . DCM (dilated cardiomyopathy) ([place])   . Acute systolic (congestive) heart failure ([place])   . Acute exacerbation of CHF (congestive heart failure) ([place]) 04/11/2019  . Metabolic acidosis 93/57/0177  . CKD (chronic kidney disease) stage 5, GFR less than 15 ml/min (HCC) 04/11/2019  . History of colon polyps 03/20/2019  . Gastroesophageal reflux disease 03/20/2019  . CKD (chronic kidney disease) stage 4, GFR 15-29 ml/min (HCC) 08/25/2018  . CKD (chronic kidney disease) stage 3, GFR 30-59 ml/min 05/10/2017  . Renal insufficiency 11/15/2016  . Acute on chronic diastolic CHF (congestive heart failure) ([place]) 10/07/2016  . Testicular hypofunction 09/15/2015  . BPH without obstruction/lower urinary tract symptoms 09/15/2015  . Bilateral hearing loss 07/03/2015  . Mixed hearing loss of right ear 07/03/2015  . Hyperlipidemia 03/29/2013  . Nephrolithiasis 03/14/2013  . Lumbar radiculopathy 02/26/2013  . Vitamin D deficiency 08/03/2011  . Uncontrolled hypertension 04/10/2010  . HLD (hyperlipidemia) 04/10/2010    Past Surgical History:  Procedure Laterality Date  . AV FISTULA PLACEMENT Left 01/04/2019   Procedure: ARTERIOVENOUS (AV) FISTULA CREATION LEFT ARM;  Surgeon: [name], MD;  Location: [facility];  Service: Vascular;  Laterality: Left;  . BASCILIC VEIN TRANSPOSITION Left 03/11/2019   Procedure: BASILIC VEIN TRANSPOSITION 2ND STAGE LEFT;  Surgeon: Monica Martinez  J, MD;  Location: [facility];  Service: Vascular;  Laterality: Left;  . NEPHRECTOMY  02/18/2011   Procedure: NEPHRECTOMY;  Surgeon: Hanley Ben, MD;  Location: [facility];  Service: Urology;  Laterality: Right;  . SMALL INTESTINE SURGERY    . STAPEDOTOMY  2005   lt ear jan, right ear sept  . surgery for ulcers  1990       Family History  Problem Relation Age of Onset  . Hyperlipidemia Father   . Heart disease Mother   . Hypertension Mother   . Hypertension  Sister   . Heart disease Brother 16       CABG  . Hyperlipidemia Sister   . Hyperlipidemia Sister   . Esophageal cancer Cousin   . Healthy Child   . Colon cancer Neg Hx   . Stomach cancer Neg Hx   . Rectal cancer Neg Hx     Social History   Tobacco Use  . Smoking status: Never Smoker  . Smokeless tobacco: Never Used  Vaping Use  . Vaping Use: Never used  Substance Use Topics  . Alcohol use: No  . Drug use: No    Home Medications Prior to Admission medications   Medication Sig Start Date End Date Taking? Authorizing Provider  acetaminophen (TYLENOL) 325 MG tablet Take by mouth.    [provider]  amLODipine (NORVASC) 5 MG tablet Take 1 tablet (5 mg total) by mouth daily. 04/18/19 05/18/19  Alma Friendly, MD  benzoyl peroxide (BENZOYL PEROXIDE) 5 % external liquid Apply topically 2 (two) times daily. 05/08/19   Forrest Moron, MD  Betamethasone Sodium Phosphate 6 MG/ML SOLN INJECT 2:4 SYRINGE BILATERALLY INTO [facility] KNEE JOINT 06/21/19   [provider]  calcium acetate (PHOSLO) 667 MG capsule Take 2 capsules (1,334 mg total) by mouth 3 (three) times daily with meals. Patient taking differently: Take 2,001 mg by mouth 3 (three) times daily with meals.  04/17/19   Alma Friendly, MD  carvedilol (COREG) 25 MG tablet Take 1 tablet (25 mg total) by mouth 2 (two) times daily with a meal. 04/17/19 05/17/19  Alma Friendly, MD  hydrALAZINE (APRESOLINE) 25 MG tablet Take 1 tablet (25 mg total) by mouth every 8 (eight) hours. 04/17/19 05/17/19  Alma Friendly, MD  icosapent Ethyl (VASCEPA) 1 g capsule Take 2 capsules (2 g total) by mouth 2 (two) times daily. 09/18/19   Hilty, Nadean Corwin, MD  iron sucrose in sodium chloride 0.9 % 100 mL Iron Sucrose (Venofer) 08/08/19 08/06/20  [provider]  lidocaine-prilocaine (EMLA) cream Apply topically. 06/11/19   [provider]  Methoxy PEG-Epoetin Beta (MIRCERA IJ) Mircera 08/01/19 07/30/20  [provider]  pantoprazole (PROTONIX) 40 MG tablet Take 1 tablet (40 mg total) by mouth daily. 04/17/19   Alma Friendly, MD  VITAMIN D PO Take by mouth. 07/09/19 07/07/20  [provider]    Allergies    Lokelma [sodium zirconium cyclosilicate], Beef-derived products, Pegademase bovine, Poractant alfa, and Pork-derived products  Review of Systems   Review of Systems  Constitutional: Positive for chills, fatigue and fever.  HENT: Positive for sinus pressure. Negative for rhinorrhea, sore throat and trouble swallowing.   Eyes: Positive for pain and redness. Negative for visual disturbance.  Respiratory: Positive for cough. Negative for chest tightness and shortness of breath.   Cardiovascular: Negative for chest pain, palpitations and leg swelling.  Gastrointestinal: Positive for abdominal pain, diarrhea and nausea. Negative for vomiting.  Musculoskeletal: Positive for myalgias.  Neurological: Positive for headaches. Negative for dizziness and light-headedness.  Psychiatric/Behavioral: Positive for sleep disturbance.  All other systems reviewed and are negative.   Physical Exam Updated Vital Signs BP (!) 165/83 (BP Location: Right Arm)   Pulse 86   Temp 99 F (37.2 C) (Oral)   Resp 16   SpO2 99%   Physical Exam Vitals and nursing note reviewed.  Constitutional:      General: He is not in acute distress. HENT:     Head: Normocephalic and atraumatic.     Nose: Nose normal.     Mouth/Throat:     Mouth: Mucous membranes are moist.     Pharynx: Uvula midline. No oropharyngeal exudate or posterior oropharyngeal erythema.  Eyes:     General:        Right eye: No discharge.        Left eye: No discharge.     Extraocular Movements: Extraocular movements intact.     Conjunctiva/sclera:     Right eye: Right conjunctiva is injected.     Left eye: Left conjunctiva is injected.     Pupils: Pupils are equal, round, and reactive to light.  Neck:     Trachea: Trachea  normal.     Meningeal: Brudzinski's sign absent.  Cardiovascular:     Rate and Rhythm: Normal rate and regular rhythm.     Pulses: Normal pulses.     Heart sounds: Normal heart sounds. No murmur heard.  No gallop.      Comments: AV fistula for dialysis in Right upper arm, palpable thrill, without warmth, erythema, or tenderness. Pulmonary:     Effort: Pulmonary effort is normal. No tachypnea or respiratory distress.     Breath sounds: Normal breath sounds. No wheezing, rhonchi or rales.  Abdominal:     General: Bowel sounds are increased. There is no distension.     Tenderness: There is no abdominal tenderness. There is no guarding or rebound.     Comments: Multiple surgical scars on the abdomen,  RUQ and midline  Musculoskeletal:        General: No deformity.     Cervical back: Normal range of motion and neck supple. No rigidity or tenderness. No pain with movement or muscular tenderness.     Right lower leg: No edema.     Left lower leg: No edema.  Lymphadenopathy:     Cervical: No cervical adenopathy.  Skin:    General: Skin is warm and dry.     Capillary Refill: Capillary refill takes less than 2 seconds.  Neurological:     Mental Status: He is alert and oriented to person, place, and time.  Psychiatric:        Mood and Affect: Mood normal.     ED Results / Procedures / Treatments   Labs (all labs ordered are listed, but only abnormal results are displayed) Labs Reviewed  SARS CORONAVIRUS 2 BY RT PCR (HOSPITAL ORDER, [facility]) - Abnormal; Notable for the following components:      Result Value   SARS Coronavirus 2 POSITIVE (*)    All other components within normal limits  COMPREHENSIVE METABOLIC PANEL - Abnormal; Notable for the following components:   Sodium 134 (*)    Chloride 94 (*)    Glucose, Bld 102 (*)    BUN 46 (*)    Creatinine, Ser 12.23 (*)    Calcium 8.4 (*)    GFR calc non  Af Amer 4 (*)    GFR calc Af Amer 5 (*)    All  other components within normal limits  CBC WITH DIFFERENTIAL/PLATELET - Abnormal; Notable for the following components:   WBC 3.6 (*)    RBC 3.56 (*)    Hemoglobin 10.3 (*)    HCT 32.2 (*)    Platelets 115 (*)    All other components within normal limits  APTT - Abnormal; Notable for the following components:   aPTT 48 (*)    All other components within normal limits  CULTURE, BLOOD (SINGLE)  LACTIC ACID, PLASMA  PROTIME-INR  LACTIC ACID, PLASMA    EKG None  Radiology DG Chest Port 1 View  Result Date: 10/31/2019 CLINICAL DATA:  Sepsis COVID positive EXAM: PORTABLE CHEST 1 VIEW COMPARISON:  April 11, 2019 FINDINGS: The heart size and mediastinal contours are within normal limits. Both lungs are clear. The visualized skeletal structures are unremarkable. IMPRESSION: No active disease. Electronically Signed   By: Prudencio Pair M.D.   On: 10/31/2019 18:46    Procedures Procedures (including critical care time)  Medications Ordered in ED Medications  acetaminophen (TYLENOL) tablet 650 mg (650 mg Oral Given 11/01/19 0216)  ondansetron (ZOFRAN-ODT) disintegrating tablet 4 mg (4 mg Oral Given 11/01/19 0218)  diphenoxylate-atropine (LOMOTIL) 2.5-0.025 MG per tablet 1 tablet (1 tablet Oral Given 11/01/19 0218)  dexamethasone (DECADRON) injection 10 mg (10 mg Intramuscular Given 11/01/19 0219)    ED Course  I have reviewed the triage vital signs and the nursing notes.  Pertinent labs & imaging results that were available during my care of the patient were reviewed by me and considered in my medical decision making (see chart for details).    MDM Rules/Calculators/A&P                         Jhalil Silvera is a 54 y/o male presenting for 3 days of flu-like symptoms.    Fever, HA, N/D, myalgia, cough, loss of taste x 3 days.   ESRD patient on dialysis x 3 weekly, missed dialysis session today due to fever.  Unvaccinated against COVID-19.    Vital signs stable on intake.  Temperature of 100.2 F, O2 saturations of 97% on RA, BP 141/73,  HR 88.  CBC without evidence of leukocytosis, Hbg of 10.3 previously 8.5, CMP with Cr of 12.23 previously 8.8 (missed dialysis today). Lactic acid normal at 0.8.   CXR negative for acute pulmonary disease.  Tested positive for COVID-19 today in the ED.   Differential diagnosis for this patient's viral syndrome includes viral URI, bronchitis, pharyngitis, COVID-19. Patient's symptoms are consistent with positive COVID-19 infection detected by PCR.   Tylenol, zofran, lomotil administered in the ED.   Decadron 10 mg IM administered in the ED.   Patient has been symptomatic for three days and is a good candidate for monoclonal antibody therapy.   Monoclonal antibody team contacted and provided with this patient's information. They should reach out to him today to schedule his infusion.   I discussed this patient's course and treatment plan with my supervising physician, who agreed with the plan to discharge this patient home with close follow up.   Mr. Ambrocio should quarantine for 10 days from the time of symptom onset, and may rejoin socially once he is fever free, and no longer having diarrhea.   He should contact his dialysis coordinator as soon as possible to schedule a makeup appointment for the  dialysis session he missed today. He was instructed to inform them that he is to receive monoclonal antibody therapy for COVID-19 infection, and if possible should be dialyzed prior to receive the monoclonal antibody infusion.   He may continue tylenol at home for symptom management. Rest and hydration will be important to his healing process.   He was given strict return precautions to return to the ED should he develop chest pain, SOB, difficulty breathing, syncope, or any other new, severe, or worsening symptoms.   Mr. Burnsworth voiced understanding of the treatment plan, and each of his questions were answered to his expressed  satisfaction.   Final Clinical Impression(s) / ED Diagnoses Final diagnoses:  [id number]    Rx / DC Orders ED Discharge Orders    None       Emeline Darling, PA-C 11/01/19 0235    Orpah Greek, MD 11/01/19 [phone]

## 2019-11-02 ENCOUNTER — Ambulatory Visit (HOSPITAL_COMMUNITY)
Admission: RE | Admit: 2019-11-02 | Discharge: 2019-11-02 | Disposition: A | Discharge status: Home / Self Care | Payer: Medicare Other | Source: Ambulatory Visit | Attending: Pulmonary Disease | Admitting: Pulmonary Disease

## 2019-11-02 ENCOUNTER — Other Ambulatory Visit: Payer: Self-pay | Admitting: Physician Assistant

## 2019-11-02 DIAGNOSIS — I5022 Chronic systolic (congestive) heart failure: Secondary | ICD-10-CM | POA: Insufficient documentation

## 2019-11-02 DIAGNOSIS — I110 Hypertensive heart disease with heart failure: Secondary | ICD-10-CM | POA: Diagnosis present

## 2019-11-02 DIAGNOSIS — Z23 Encounter for immunization: Secondary | ICD-10-CM | POA: Diagnosis not present

## 2019-11-02 DIAGNOSIS — N185 Chronic kidney disease, stage 5: Secondary | ICD-10-CM

## 2019-11-02 DIAGNOSIS — I5033 Acute on chronic diastolic (congestive) heart failure: Secondary | ICD-10-CM | POA: Diagnosis present

## 2019-11-02 DIAGNOSIS — U071 COVID-19: Secondary | ICD-10-CM

## 2019-11-02 MED ORDER — FAMOTIDINE IN NACL 20-0.9 MG/50ML-% IV SOLN: 20.0000 mg | Freq: Once | INTRAVENOUS | Status: DC | PRN

## 2019-11-02 MED ORDER — DIPHENHYDRAMINE HCL 50 MG/ML IJ SOLN: 50.0000 mg | Freq: Once | INTRAMUSCULAR | Status: DC | PRN

## 2019-11-02 MED ORDER — SODIUM CHLORIDE 0.9 % IV SOLN: INTRAVENOUS | Status: DC | PRN

## 2019-11-02 MED ORDER — METHYLPREDNISOLONE SODIUM SUCC 125 MG IJ SOLR: 125.0000 mg | Freq: Once | INTRAMUSCULAR | Status: DC | PRN

## 2019-11-02 MED ORDER — ACETAMINOPHEN 325 MG PO TABS
650.0000 mg | ORAL_TABLET | Freq: Once | ORAL | Status: AC
  Administered 2019-11-02: 650 mg via ORAL
  Filled 2019-11-02: qty 2

## 2019-11-02 MED ORDER — SODIUM CHLORIDE 0.9 % IV SOLN
1200.0000 mg | Freq: Once | INTRAVENOUS | Status: AC
  Administered 2019-11-02: 1200 mg via INTRAVENOUS

## 2019-11-02 MED ORDER — ALBUTEROL SULFATE HFA 108 (90 BASE) MCG/ACT IN AERS: 2.0000 | INHALATION_SPRAY | Freq: Once | RESPIRATORY_TRACT | Status: DC | PRN

## 2019-11-02 MED ORDER — EPINEPHRINE 0.3 MG/0.3ML IJ SOAJ: 0.3000 mg | Freq: Once | INTRAMUSCULAR | Status: DC | PRN

## 2019-11-02 NOTE — Progress Notes (Signed)
  Diagnosis: COVID-19  Physician:Dr Joya Gaskins   Procedure: [facility] Infusion Clinic Med: casirivimab\imdevimab infusion - Provided patient with casirivimab\imdevimab fact sheet for patients, parents and caregivers prior to infusion.  Complications: No immediate complications noted.  Discharge: Discharged home   Dewaine Oats 11/02/2019

## 2019-11-02 NOTE — Discharge Instructions (Signed)

## 2019-11-02 NOTE — Progress Notes (Signed)
I connected by phone with [name] on 11/02/2019 at 9:19 AM to discuss the potential use of a new treatment for mild to moderate COVID-19 viral infection in non-hospitalized patients.  This patient is a 54 y.o. male that meets the FDA criteria for Emergency Use Authorization of COVID monoclonal antibody casirivimab/imdevimab.  Has a (+) direct SARS-CoV-2 viral test result  Has mild or moderate COVID-19   Is NOT hospitalized due to COVID-19  Is within 10 days of symptom onset  Has at least one of the high risk factor(s) for progression to severe COVID-19 and/or hospitalization as defined in EUA.  Specific high risk criteria : BMI > 25, Chronic Kidney Disease (CKD) and Cardiovascular disease or hypertension   I have spoken and communicated the following to the patient or parent/caregiver regarding COVID monoclonal antibody treatment:  1. FDA has authorized the emergency use for the treatment of mild to moderate COVID-19 in adults and pediatric patients with positive results of direct SARS-CoV-2 viral testing who are 33 years of age and older weighing at least 40 kg, and who are at high risk for progressing to severe COVID-19 and/or hospitalization.  2. The significant known and potential risks and benefits of COVID monoclonal antibody, and the extent to which such potential risks and benefits are unknown.  3. Information on available alternative treatments and the risks and benefits of those alternatives, including clinical trials.  4. Patients treated with COVID monoclonal antibody should continue to self-isolate and use infection control measures (e.g., wear mask, isolate, social distance, avoid sharing personal items, clean and disinfect "high touch" surfaces, and frequent handwashing) according to CDC guidelines.   5. The patient or parent/caregiver has the option to accept or refuse COVID monoclonal antibody treatment.  After reviewing this information with the patient, The patient  agreed to proceed with receiving casirivimab\imdevimab infusion and will be provided a copy of the Fact sheet prior to receiving the infusion.  Sx onset 9/13. Set up for infusion on 9/18 @ 11am. Directions given to [facility]. Pt is aware that insurance will be charged an infusion fee. Pt is unvaccinated.   Of note, pt is on HD and scheduled for a run today at 5pm, but the patients said he is too weak to attend. I called Dr. Johnney Ou who is on call today and she recommended that he NOT miss this session. I spent over an hour on the phone with him and discussed that is AMA to skip his session today but he was adamant that he was not going because he felt too sick and has not slept. Given his many RFs and high risk for severe disease we will proceed with the monoclonal antibody infusion today and he will contact his dialysis center to r/s.   Angelena Form 11/02/2019 9:19 AM

## 2019-11-05 LAB — CULTURE, BLOOD (SINGLE)
Culture: NO GROWTH
Special Requests: ADEQUATE

## 2019-12-03 LAB — LIPID PANEL
Chol/HDL Ratio: 11 ratio — ABNORMAL HIGH (ref 0.0–5.0)
Cholesterol, Total: 264 mg/dL — ABNORMAL HIGH (ref 100–199)
HDL: 24 mg/dL — ABNORMAL LOW (ref >39)
LDL Chol Calc (NIH): 108 mg/dL — ABNORMAL HIGH (ref 0–99)
Triglycerides: 746 mg/dL (ref 0–149)
VLDL Cholesterol Cal: 132 mg/dL — ABNORMAL HIGH (ref 5–40)

## 2019-12-04 ENCOUNTER — Encounter: Payer: Self-pay | Admitting: Internal Medicine

## 2019-12-04 ENCOUNTER — Ambulatory Visit (INDEPENDENT_AMBULATORY_CARE_PROVIDER_SITE_OTHER): Payer: Medicare Other | Admitting: Internal Medicine

## 2019-12-04 ENCOUNTER — Other Ambulatory Visit: Payer: Self-pay

## 2019-12-04 VITALS — BP 122/68 | HR 89 | Ht 69.0 in | Wt 244.6 lb

## 2019-12-04 DIAGNOSIS — Z789 Other specified health status: Secondary | ICD-10-CM

## 2019-12-04 DIAGNOSIS — E785 Hyperlipidemia, unspecified: Secondary | ICD-10-CM | POA: Diagnosis not present

## 2019-12-04 DIAGNOSIS — E781 Pure hyperglyceridemia: Secondary | ICD-10-CM

## 2019-12-04 DIAGNOSIS — N186 End stage renal disease: Secondary | ICD-10-CM | POA: Diagnosis not present

## 2019-12-04 DIAGNOSIS — Z992 Dependence on renal dialysis: Secondary | ICD-10-CM

## 2019-12-04 NOTE — Progress Notes (Signed)
LIPID CLINIC CONSULT NOTE  Chief Complaint:  Follow-up  Primary Care Physician: Horald Pollen, MD  Primary Cardiologist:  Ena Dawley, MD  HPI:  [name] is a 54 y.o. male who is being seen today for the evaluation of dyslipidemia at the request of Horald Pollen, *.  This is a pleasant 54 year old Arabic male who was seen today with a Psychologist, sport and exercise.  His past medical history significant for chronic kidney disease which is stage IV and currently a GFR of around 10.  He has been followed by [name] with nephrology and has an appointment in October to see Dr. Carlis Abbott with vascular surgery for creation of a fistula.  He has had longstanding dyslipidemia and multiple medication intolerances.  He was previously followed in the [facility] lipid clinic by [name], PharmD.  When he was last seen in November 2019, he was on rosuvastatin 10 mg daily and Lovaza 2 g twice daily.  He was reportedly intolerance to atorvastatin 80 mg causing myalgias and fenofibrate 160 mg causing myalgias.  I reviewed the medicine list with him today and looked at all the medications that were in his possession.  After working with the translator we determined that he was still taking the rosuvastatin 10 mg daily and also fenofibrate 160 mg daily.  He did not have any of the Lovaza.  Previously he was also evaluated apparently for Vascepa but felt to be unable to obtain that due to financial constraints.  He is working and does have [employer] but is in application for disability.  There is a strong family history of coronary disease in multiple family members.  His only other complaint today is some shortness of breath and next edema.  Is difficult to know whether shortness of breath may be related to advanced chronic kidney disease or coronary disease.  Given his advanced creatinine, it is unlikely that he could have any dye related cardiovascular work-up.  His last stress test was in  2015.  His most recent lipids from August 24, 2018 showed total cholesterol 313, triglycerides 1340, HDL 24 and direct LDL of 63.  03/12/2019  [name] returns today for follow-up of hypertriglyceridemia.  He has been taking Vascepa 2 g twice daily.  This has caused a resultant decrease in his triglycerides from 1340-741.  Although this is still elevated, represents a significant reduction.  It is difficult to get his triglycerides much lower given his underlying renal disease.  Unfortunately he was not a candidate for a fibrate.  He reports other medication intolerances.  With this medicine he has says through the translator that he thinks he is having headaches and possibly some abdominal pain.  I advised him to decrease the dose to 1 g twice daily, although not as effective, it may have less chance for side effect.  If this is better tolerated he should continue it and if not then he will have no choice but to stop it.  I do not see any other options for lowering triglycerides at this time other than strict diet a very low saturated fats less than 5 to 10% of calories.  I provided him with this information today including dietary recommendations.  09/02/2019  [name] was seen today for follow-up with a professional Fish farm manager via the [employer]..  He had marked improvement in his triglycerides on Vascepa however unfortunately had progressive renal dysfunction and ultimately has gone on dialysis.  Prior to that he  was having headaches and other symptoms which I believe are related to end-stage renal disease however he felt it could be related to the Vascepa and discontinue the medicine.  Not surprisingly his triglycerides have accordingly increased.  At one point triglycerides were well over thousand and had come down to 305 but recently went up to 607.  12/04/2019  [name] is seen today in follow-up.  He is not accompanied by a Optometrist.  Unfortunately his English is difficult  to understand.  I did note that his lipids have essentially been unchanged.  Triglycerides are still elevated at 746 despite the fact that he reports taking the Vascepa 2 g twice daily.  He is on dialysis.  Unfortunately he recently had COVID-19 was treated with monoclonal antibody therapy about a month ago.  He says he is recovered from that.  PMHx:  Past Medical History:  Diagnosis Date  . Anemia   . Back pain   . CKD (chronic kidney disease), stage III (HCC)    Stage 4  . Colon polyps    adenomatous  . Elevated cholesterol   . History of echocardiogram    Echo 11/17: EF 65-70, normal wall motion, grade 1 diastolic dysfunction, trivial MR, mild LAE, mild TR  . Hyperlipidemia   . Hypertension    no current bp meds for last 3 months  . Kidney stones 2007  . Medication intolerance    a. multiple with prior nonadherence to regimen.  . Nausea    " in the morning since having the kidney problem "  . Otosclerosis of both ears   . Pneumonia   . PONV (postoperative nausea and vomiting)   . PUD (peptic ulcer disease)    Has had unspecified surgery for this  . Sleep apnea     Past Surgical History:  Procedure Laterality Date  . AV FISTULA PLACEMENT Left 01/04/2019   Procedure: ARTERIOVENOUS (AV) FISTULA CREATION LEFT ARM;  Surgeon: [name], MD;  Location: [facility];  Service: Vascular;  Laterality: Left;  . BASCILIC VEIN TRANSPOSITION Left 03/11/2019   Procedure: BASILIC VEIN TRANSPOSITION 2ND STAGE LEFT;  Surgeon: [name], MD;  Location: [facility];  Service: Vascular;  Laterality: Left;  . NEPHRECTOMY  02/18/2011   Procedure: NEPHRECTOMY;  Surgeon: Hanley Ben, MD;  Location: [facility];  Service: Urology;  Laterality: Right;  . SMALL INTESTINE SURGERY    . STAPEDOTOMY  2005   lt ear jan, right ear sept  . surgery for ulcers  1990    FAMHx:  Family History  Problem Relation Age of Onset  . Hyperlipidemia Father   . Heart disease Mother   . Hypertension Mother    . Hypertension Sister   . Heart disease Brother 93       CABG  . Hyperlipidemia Sister   . Hyperlipidemia Sister   . Esophageal cancer Cousin   . Healthy Child   . Colon cancer Neg Hx   . Stomach cancer Neg Hx   . Rectal cancer Neg Hx     SOCHx:   reports that he has never smoked. He has never used smokeless tobacco. He reports that he does not drink alcohol and does not use drugs.  ALLERGIES:  Allergies  Allergen Reactions  . Lokelma [Sodium Zirconium Cyclosilicate] Shortness Of Breath  . Beef-Derived Products Other (See Comments)    Cultural preference  . Pegademase Bovine Other (See Comments)    Cultural preference  . Poractant Alfa Other (See Comments)  Cultural preference  . Pork-Derived Products Other (See Comments)    Cultural preference    ROS: Pertinent items noted in HPI and remainder of comprehensive ROS otherwise negative.  HOME MEDS: Current Outpatient Medications on File Prior to Visit  Medication Sig Dispense Refill  . acetaminophen (TYLENOL) 325 MG tablet Take by mouth.    . benzoyl peroxide (BENZOYL PEROXIDE) 5 % external liquid Apply topically 2 (two) times daily. 142 g 12  . Betamethasone Sodium Phosphate 6 MG/ML SOLN INJECT 2:4 SYRINGE BILATERALLY INTO EACH KNEE JOINT    . calcium acetate (PHOSLO) 667 MG capsule Take 2 capsules (1,334 mg total) by mouth 3 (three) times daily with meals. (Patient taking differently: Take 2,001 mg by mouth 3 (three) times daily with meals. ) 180 capsule 3  . icosapent Ethyl (VASCEPA) 1 g capsule Take 2 capsules (2 g total) by mouth 2 (two) times daily. 120 capsule 11  . iron sucrose in sodium chloride 0.9 % 100 mL Iron Sucrose (Venofer)    . lidocaine-prilocaine (EMLA) cream Apply topically.    . Methoxy PEG-Epoetin Beta (MIRCERA IJ) Mircera    . pantoprazole (PROTONIX) 40 MG tablet Take 1 tablet (40 mg total) by mouth daily. 30 tablet 5  . VITAMIN D PO Take by mouth.    Marland Kitchen amLODipine (NORVASC) 5 MG tablet Take 1  tablet (5 mg total) by mouth daily. 30 tablet 0  . carvedilol (COREG) 25 MG tablet Take 1 tablet (25 mg total) by mouth 2 (two) times daily with a meal. 60 tablet 0  . hydrALAZINE (APRESOLINE) 25 MG tablet Take 1 tablet (25 mg total) by mouth every 8 (eight) hours. 90 tablet 0   No current facility-administered medications on file prior to visit.    LABS/IMAGING: Results for orders placed or performed in visit on 09/02/19 (from the past 48 hour(s))  Lipid panel     Status: Abnormal   Collection Time: 12/02/19  2:07 PM  Result Value Ref Range   Cholesterol, Total 264 (H) 100 - 199 mg/dL   Triglycerides 746 (HH) 0 - 149 mg/dL   HDL 24 (L) >39 mg/dL   VLDL Cholesterol Cal 132 (H) 5 - 40 mg/dL   LDL Chol Calc (NIH) 108 (H) 0 - 99 mg/dL   Chol/HDL Ratio 11.0 (H) 0.0 - 5.0 ratio    Comment:                                   T. Chol/HDL Ratio                                             Men  Women                               1/2 Avg.Risk  3.4    3.3                                   Avg.Risk  5.0    4.4                                2X Avg.Risk  9.6    7.1                                3X Avg.Risk 23.4   11.0    No results found.  LIPID PANEL:    Component Value Date/Time   CHOL 264 (H) 12/02/2019 1407   TRIG 746 (HH) 12/02/2019 1407   HDL 24 (L) 12/02/2019 1407   CHOLHDL 11.0 (H) 12/02/2019 1407   CHOLHDL 8.5 04/13/2019 0450   VLDL 61 (H) 04/13/2019 0450   LDLCALC 108 (H) 12/02/2019 1407   LDLDIRECT 63 08/24/2018 1458   LDLDIRECT 139.0 05/06/2014 1209    WEIGHTS: Wt Readings from Last 3 Encounters:  12/04/19 244 lb 9.6 oz (110.9 kg)  09/02/19 241 lb 6.4 oz (109.5 kg)  08/20/19 236 lb (107 kg)    VITALS: BP 122/68   Pulse 89   Ht 5\' 9"  (1.753 m)   Wt 244 lb 9.6 oz (110.9 kg)   SpO2 94%   BMI 36.12 kg/m   EXAM: Deferred  EKG: Deferred  ASSESSMENT: 1. Secondary hypertriglyceridemia secondary to advanced kidney disease 2. Progressive dyspnea on exertion, ?  coronary equivalent 3. ESRD-now on dialysis  PLAN: 1.   Mr. Calzadilla had previously had a much better response on the Vascepa but lipids have gone back up and now remain elevated in the 700s.  He reports compliance with it but there may be other factors as to why his triglycerides are so high.  Certainly one issue is end-stage renal disease.  He is really not a candidate for statin at this point but is pursuing a possible renal transplant.  He cannot take a fibrate due to his advanced kidney disease.  I will continue to work on it diet which should be low in saturated fats to less than 20% of calories and increase exercise and weight loss.  Continue Vascepa.  Repeat lipids in 6 months and follow-up with me afterwards.  Pixie Casino, MD, [facility], [place] Director of the Advanced Lipid Disorders &  Cardiovascular Risk Reduction Clinic Diplomate of the American Board of Clinical Lipidology Attending Cardiologist  Direct Dial: [phone]  Fax: [phone]  Website:  [email or web address] [email or web address] Fitzroy Mikami 12/04/2019, 11:33 AM

## 2019-12-04 NOTE — Patient Instructions (Signed)
Medication Instructions:  Your physician recommends that you continue on your current medications as directed. Please refer to the Current Medication list given to you today.  *If you need a refill on your cardiac medications before your next appointment, please call your pharmacy*   Lab Work: FASTING LIPID PANEL in 6 months to check cholesterol   If you have labs (blood work) drawn today and your tests are completely normal, you will receive your results only by: Marland Kitchen MyChart Message (if you have MyChart) OR . A paper copy in the mail If you have any lab test that is abnormal or we need to change your treatment, we will call you to review the results.   Testing/Procedures: NONE   Follow-Up: At [facility], you and your health needs are our priority.  As part of our continuing mission to provide you with exceptional heart care, we have created designated Provider Care Teams.  These Care Teams include your primary Cardiologist (physician) and Advanced Practice Providers (APPs -  Physician Assistants and Nurse Practitioners) who all work together to provide you with the care you need, when you need it.  We recommend signing up for the patient portal called "MyChart".  Sign up information is provided on this After Visit Summary.  MyChart is used to connect with patients for Virtual Visits (Telemedicine).  Patients are able to view lab/test results, encounter notes, upcoming appointments, etc.  Non-urgent messages can be sent to your provider as well.   To learn more about what you can do with MyChart, go to [email or web address].    Your next appointment:   6 month(s) - lipid clinic  The format for your next appointment:   In Person  Provider:   K. Mali Hilty, MD   Other Instructions

## 2019-12-30 ENCOUNTER — Ambulatory Visit (INDEPENDENT_AMBULATORY_CARE_PROVIDER_SITE_OTHER): Payer: Self-pay | Admitting: Emergency Medicine

## 2019-12-30 ENCOUNTER — Other Ambulatory Visit: Payer: Self-pay

## 2019-12-30 ENCOUNTER — Encounter: Payer: Self-pay | Admitting: Emergency Medicine

## 2019-12-30 VITALS — BP 136/70 | HR 76 | Temp 98.2°F | Resp 16 | Ht 69.0 in | Wt 246.0 lb

## 2019-12-30 DIAGNOSIS — I5032 Chronic diastolic (congestive) heart failure: Secondary | ICD-10-CM

## 2019-12-30 DIAGNOSIS — N186 End stage renal disease: Secondary | ICD-10-CM

## 2019-12-30 DIAGNOSIS — Z992 Dependence on renal dialysis: Secondary | ICD-10-CM

## 2019-12-30 DIAGNOSIS — E781 Pure hyperglyceridemia: Secondary | ICD-10-CM

## 2019-12-30 DIAGNOSIS — I110 Hypertensive heart disease with heart failure: Secondary | ICD-10-CM

## 2019-12-30 DIAGNOSIS — I5022 Chronic systolic (congestive) heart failure: Secondary | ICD-10-CM

## 2019-12-30 DIAGNOSIS — I10 Essential (primary) hypertension: Secondary | ICD-10-CM

## 2019-12-30 MED ORDER — CALCIUM ACETATE (PHOS BINDER) 667 MG PO CAPS: 1334.0000 mg | ORAL_CAPSULE | Freq: Three times a day (TID) | ORAL | 3 refills | Status: AC

## 2019-12-30 MED ORDER — BENZOYL PEROXIDE WASH 5 % EX LIQD: Freq: Two times a day (BID) | CUTANEOUS | 12 refills | Status: AC

## 2019-12-30 NOTE — Patient Instructions (Addendum)
   If you have lab work done today you will be contacted with your lab results within the next 2 weeks.  If you have not heard from us then please contact us. The fastest way to get your results is to register for My Chart.   IF you received an x-ray today, you will receive an invoice from [facility]. Please contact [facility] at [phone] with questions or concerns regarding your invoice.   IF you received labwork today, you will receive an invoice from [facility]. Please contact [facility] at [phone] with questions or concerns regarding your invoice.   Our billing staff will not be able to assist you with questions regarding bills from these companies.  You will be contacted with the lab results as soon as they are available. The fastest way to get your results is to activate your My Chart account. Instructions are located on the last page of this paperwork. If you have not heard from us regarding the results in 2 weeks, please contact this office.      Hypertension, Adult High blood pressure (hypertension) is when the force of blood pumping through the arteries is too strong. The arteries are the blood vessels that carry blood from the heart throughout the body. Hypertension forces the heart to work harder to pump blood and may cause arteries to become narrow or stiff. Untreated or uncontrolled hypertension can cause a heart attack, heart failure, a stroke, kidney disease, and other problems. A blood pressure reading consists of a higher number over a lower number. Ideally, your blood pressure should be below 120/80. The first ("top") number is called the systolic pressure. It is a measure of the pressure in your arteries as your heart beats. The second ("bottom") number is called the diastolic pressure. It is a measure of the pressure in your arteries as the heart relaxes. What are the causes? The exact cause of this condition is not known. There are some conditions  that result in or are related to high blood pressure. What increases the risk? Some risk factors for high blood pressure are under your control. The following factors may make you more likely to develop this condition:  Smoking.  Having type 2 diabetes mellitus, high cholesterol, or both.  Not getting enough exercise or physical activity.  Being overweight.  Having too much fat, sugar, calories, or salt (sodium) in your diet.  Drinking too much alcohol. Some risk factors for high blood pressure may be difficult or impossible to change. Some of these factors include:  Having chronic kidney disease.  Having a family history of high blood pressure.  Age. Risk increases with age.  Race. You may be at higher risk if you are African American.  Gender. Men are at higher risk than women before age 45. After age 65, women are at higher risk than men.  Having obstructive sleep apnea.  Stress. What are the signs or symptoms? High blood pressure may not cause symptoms. Very high blood pressure (hypertensive crisis) may cause:  Headache.  Anxiety.  Shortness of breath.  Nosebleed.  Nausea and vomiting.  Vision changes.  Severe chest pain.  Seizures. How is this diagnosed? This condition is diagnosed by measuring your blood pressure while you are seated, with your arm resting on a flat surface, your legs uncrossed, and your feet flat on the floor. The cuff of the blood pressure monitor will be placed directly against the skin of your upper arm at the level of your   heart. It should be measured at least twice using the same arm. Certain conditions can cause a difference in blood pressure between your right and left arms. Certain factors can cause blood pressure readings to be lower or higher than normal for a short period of time:  When your blood pressure is higher when you are in a health care provider's office than when you are at home, this is called white coat hypertension.  Most people with this condition do not need medicines.  When your blood pressure is higher at home than when you are in a health care provider's office, this is called masked hypertension. Most people with this condition may need medicines to control blood pressure. If you have a high blood pressure reading during one visit or you have normal blood pressure with other risk factors, you may be asked to:  Return on a different day to have your blood pressure checked again.  Monitor your blood pressure at home for 1 week or longer. If you are diagnosed with hypertension, you may have other blood or imaging tests to help your health care provider understand your overall risk for other conditions. How is this treated? This condition is treated by making healthy lifestyle changes, such as eating healthy foods, exercising more, and reducing your alcohol intake. Your health care provider may prescribe medicine if lifestyle changes are not enough to get your blood pressure under control, and if:  Your systolic blood pressure is above 130.  Your diastolic blood pressure is above 80. Your personal target blood pressure may vary depending on your medical conditions, your age, and other factors. Follow these instructions at home: Eating and drinking   Eat a diet that is high in fiber and potassium, and low in sodium, added sugar, and fat. An example eating plan is called the DASH (Dietary Approaches to Stop Hypertension) diet. To eat this way: ? Eat plenty of fresh fruits and vegetables. Try to fill one half of your plate at each meal with fruits and vegetables. ? Eat whole grains, such as whole-wheat pasta, brown rice, or whole-grain bread. Fill about one fourth of your plate with whole grains. ? Eat or drink low-fat dairy products, such as skim milk or low-fat yogurt. ? Avoid fatty cuts of meat, processed or cured meats, and poultry with skin. Fill about one fourth of your plate with lean proteins, such  as fish, chicken without skin, beans, eggs, or tofu. ? Avoid pre-made and processed foods. These tend to be higher in sodium, added sugar, and fat.  Reduce your daily sodium intake. Most people with hypertension should eat less than 1,500 mg of sodium a day.  Do not drink alcohol if: ? Your health care provider tells you not to drink. ? You are pregnant, may be pregnant, or are planning to become pregnant.  If you drink alcohol: ? Limit how much you use to:  0-1 drink a day for women.  0-2 drinks a day for men. ? Be aware of how much alcohol is in your drink. In the [place] one drink equals one 12 oz bottle of beer (355 mL), one 5 oz glass of wine (148 mL), or one 1 oz glass of hard liquor (44 mL). Lifestyle   Work with your health care provider to maintain a healthy body weight or to lose weight. Ask what an ideal weight is for you.  Get at least 30 minutes of exercise most days of the week. Activities may include walking, swimming,   or biking.  Include exercise to strengthen your muscles (resistance exercise), such as Pilates or lifting weights, as part of your weekly exercise routine. Try to do these types of exercises for 30 minutes at least 3 days a week.  Do not use any products that contain nicotine or tobacco, such as cigarettes, e-cigarettes, and chewing tobacco. If you need help quitting, ask your health care provider.  Monitor your blood pressure at home as told by your health care provider.  Keep all follow-up visits as told by your health care provider. This is important. Medicines  Take over-the-counter and prescription medicines only as told by your health care provider. Follow directions carefully. Blood pressure medicines must be taken as prescribed.  Do not skip doses of blood pressure medicine. Doing this puts you at risk for problems and can make the medicine less effective.  Ask your health care provider about side effects or reactions to medicines that you  should watch for. Contact a health care provider if you:  Think you are having a reaction to a medicine you are taking.  Have headaches that keep coming back (recurring).  Feel dizzy.  Have swelling in your ankles.  Have trouble with your vision. Get help right away if you:  Develop a severe headache or confusion.  Have unusual weakness or numbness.  Feel faint.  Have severe pain in your chest or abdomen.  Vomit repeatedly.  Have trouble breathing. Summary  Hypertension is when the force of blood pumping through your arteries is too strong. If this condition is not controlled, it may put you at risk for serious complications.  Your personal target blood pressure may vary depending on your medical conditions, your age, and other factors. For most people, a normal blood pressure is less than 120/80.  Hypertension is treated with lifestyle changes, medicines, or a combination of both. Lifestyle changes include losing weight, eating a healthy, low-sodium diet, exercising more, and limiting alcohol. This information is not intended to replace advice given to you by your health care provider. Make sure you discuss any questions you have with your health care provider. Document Revised: 10/11/2017 Document Reviewed: 10/11/2017 Elsevier Patient Education  2020 [employer]  

## 2019-12-30 NOTE — Progress Notes (Signed)
[name] 54 y.o.   Chief Complaint  Patient presents with  . Hypertension    follow up    12/04/2019 cardiology office visit note:  [name] is seen today in follow-up.  He is not accompanied by a Optometrist.  Unfortunately his English is difficult to understand.  I did note that his lipids have essentially been unchanged.  Triglycerides are still elevated at 746 despite the fact that he reports taking the Vascepa 2 g twice daily.  He is on dialysis.  Unfortunately he recently had COVID-19 was treated with monoclonal antibody therapy about a month ago.  He says he is recovered from that. HPI:  [name] is a 54 y.o. male who is being seen today for the evaluation of dyslipidemia at the request of Horald Pollen, *.  This is a pleasant 54 year old Arabic male who was seen today with a Psychologist, sport and exercise.  His past medical history significant for chronic kidney disease which is stage IV and currently a GFR of around 10.  He has been followed by [name] with nephrology and has an appointment in October to see Dr. Carlis Abbott with vascular surgery for creation of a fistula.  He has had longstanding dyslipidemia and multiple medication intolerances.  He was previously followed in the [facility] lipid clinic by [name], PharmD.  When he was last seen in November 2019, he was on rosuvastatin 10 mg daily and Lovaza 2 g twice daily.  He was reportedly intolerance to atorvastatin 80 mg causing myalgias and fenofibrate 160 mg causing myalgias.  I reviewed the medicine list with him today and looked at all the medications that were in his possession.  After working with the translator we determined that he was still taking the rosuvastatin 10 mg daily and also fenofibrate 160 mg daily.  He did not have any of the Lovaza.  Previously he was also evaluated apparently for Vascepa but felt to be unable to obtain that due to financial constraints.  He is working and does have [employer] but is in  application for disability.  There is a strong family history of coronary disease in multiple family members.  His only other complaint today is some shortness of breath and next edema.  Is difficult to know whether shortness of breath may be related to advanced chronic kidney disease or coronary disease.  Given his advanced creatinine, it is unlikely that he could have any dye related cardiovascular work-up.  His last stress test was in 2015.  His most recent lipids from August 24, 2018 showed total cholesterol 313, triglycerides 1340, HDL 24 and direct LDL of 63.  HISTORY OF PRESENT ILLNESS: This is a 54 y.o. male dialysis patient here for follow-up of hypertension. No other complaints or medical concerns today.  HPI   Prior to Admission medications   Medication Sig Start Date End Date Taking? Authorizing Provider  acetaminophen (TYLENOL) 325 MG tablet Take by mouth.   Yes [provider]  benzoyl peroxide (BENZOYL PEROXIDE) 5 % external liquid Apply topically 2 (two) times daily. 05/08/19  Yes Forrest Moron, MD  Betamethasone Sodium Phosphate 6 MG/ML SOLN INJECT 2:4 [place] JOINT 06/21/19  Yes [provider]  calcium acetate (PHOSLO) 667 MG capsule Take 2 capsules (1,334 mg total) by mouth 3 (three) times daily with meals. Patient taking differently: Take 2,001 mg by mouth 3 (three) times daily with meals.  04/17/19  Yes Alma Friendly, MD  icosapent  Ethyl (VASCEPA) 1 g capsule Take 2 capsules (2 g total) by mouth 2 (two) times daily. 09/18/19  Yes Hilty, Nadean Corwin, MD  iron sucrose in sodium chloride 0.9 % 100 mL Iron Sucrose (Venofer) 08/08/19 08/06/20 Yes [provider]  lidocaine-prilocaine (EMLA) cream Apply topically. 06/11/19  Yes [provider]  Methoxy PEG-Epoetin Beta (MIRCERA IJ) Mircera 08/01/19 07/30/20 Yes [provider]  pantoprazole (PROTONIX) 40 MG tablet Take 1 tablet (40 mg total) by mouth daily. 04/17/19   Yes Alma Friendly, MD  VITAMIN D PO Take by mouth. 07/09/19 07/07/20 Yes [provider]  amLODipine (NORVASC) 5 MG tablet Take 1 tablet (5 mg total) by mouth daily. 04/18/19 05/18/19  Alma Friendly, MD  carvedilol (COREG) 25 MG tablet Take 1 tablet (25 mg total) by mouth 2 (two) times daily with a meal. 04/17/19 05/17/19  Alma Friendly, MD  hydrALAZINE (APRESOLINE) 25 MG tablet Take 1 tablet (25 mg total) by mouth every 8 (eight) hours. 04/17/19 05/17/19  Alma Friendly, MD    Allergies  Allergen Reactions  . Lokelma [Sodium Zirconium Cyclosilicate] Shortness Of Breath  . Beef-Derived Products Other (See Comments)    Cultural preference  . Pegademase Bovine Other (See Comments)    Cultural preference  . Poractant Alfa Other (See Comments)    Cultural preference  . Pork-Derived Products Other (See Comments)    Cultural preference    Patient Active Problem List   Diagnosis Date Noted  . Hypertensive heart disease with chronic systolic congestive heart failure ([place]) 07/01/2019  . DCM (dilated cardiomyopathy) ([place])   . Acute systolic (congestive) heart failure ([place])   . Acute exacerbation of CHF (congestive heart failure) ([place]) 04/11/2019  . Metabolic acidosis 54/62/7035  . CKD (chronic kidney disease) stage 5, GFR less than 15 ml/min (HCC) 04/11/2019  . History of colon polyps 03/20/2019  . Gastroesophageal reflux disease 03/20/2019  . CKD (chronic kidney disease) stage 4, GFR 15-29 ml/min (HCC) 08/25/2018  . CKD (chronic kidney disease) stage 3, GFR 30-59 ml/min (HCC) 05/10/2017  . Renal insufficiency 11/15/2016  . Acute on chronic diastolic CHF (congestive heart failure) ([place]) 10/07/2016  . Testicular hypofunction 09/15/2015  . BPH without obstruction/lower urinary tract symptoms 09/15/2015  . Bilateral hearing loss 07/03/2015  . Mixed hearing loss of right ear 07/03/2015  . Hyperlipidemia 03/29/2013  . Nephrolithiasis 03/14/2013  . Lumbar radiculopathy  02/26/2013  . Vitamin D deficiency 08/03/2011  . Uncontrolled hypertension 04/10/2010  . HLD (hyperlipidemia) 04/10/2010    Past Medical History:  Diagnosis Date  . Anemia   . Back pain   . CKD (chronic kidney disease), stage III (HCC)    Stage 4  . Colon polyps    adenomatous  . Elevated cholesterol   . History of echocardiogram    Echo 11/17: EF 65-70, normal wall motion, grade 1 diastolic dysfunction, trivial MR, mild LAE, mild TR  . Hyperlipidemia   . Hypertension    no current bp meds for last 3 months  . Kidney stones 2007  . Medication intolerance    a. multiple with prior nonadherence to regimen.  . Nausea    " in the morning since having the kidney problem "  . Otosclerosis of both ears   . Pneumonia   . PONV (postoperative nausea and vomiting)   . PUD (peptic ulcer disease)    Has had unspecified surgery for this  . Sleep apnea     Past Surgical History:  Procedure  Laterality Date  . AV FISTULA PLACEMENT Left 01/04/2019   Procedure: ARTERIOVENOUS (AV) FISTULA CREATION LEFT ARM;  Surgeon: [name], MD;  Location: [facility];  Service: Vascular;  Laterality: Left;  . BASCILIC VEIN TRANSPOSITION Left 03/11/2019   Procedure: BASILIC VEIN TRANSPOSITION 2ND STAGE LEFT;  Surgeon: [name], MD;  Location: [facility];  Service: Vascular;  Laterality: Left;  . NEPHRECTOMY  02/18/2011   Procedure: NEPHRECTOMY;  Surgeon: Hanley Ben, MD;  Location: [facility];  Service: Urology;  Laterality: Right;  . SMALL INTESTINE SURGERY    . STAPEDOTOMY  2005   lt ear jan, right ear sept  . surgery for ulcers  1990    Social History   Socioeconomic History  . Marital status: Married    Spouse name: Edgardo Roys  . Number of children: 5  . Years of education: Not on file  . Highest education level: 12th grade  Occupational History    Employer: [employer]  Tobacco Use  . Smoking status: Never Smoker  . Smokeless tobacco: Never Used  Vaping Use  . Vaping Use:  Never used  Substance and Sexual Activity  . Alcohol use: No  . Drug use: No  . Sexual activity: Yes    Birth control/protection: None  Other Topics Concern  . Not on file  Social History Narrative   Lives at home with wife and family. He is from [place]. Came to the [place] in 2002.      Patient is right-handed. He lives in a one level home. He drinks 1-2 cups of coffee and tea a day. He does not exercise.   Social Determinants of Health   Financial Resource Strain:   . Difficulty of Paying Living Expenses: Not on file  Food Insecurity:   . Worried About Charity fundraiser in the Last Year: Not on file  . Ran Out of Food in the Last Year: Not on file  Transportation Needs:   . Lack of Transportation (Medical): Not on file  . Lack of Transportation (Non-Medical): Not on file  Physical Activity:   . Days of Exercise per Week: Not on file  . Minutes of Exercise per Session: Not on file  Stress:   . Feeling of Stress : Not on file  Social Connections:   . Frequency of Communication with Friends and Family: Not on file  . Frequency of Social Gatherings with Friends and Family: Not on file  . Attends Religious Services: Not on file  . Active Member of Clubs or Organizations: Not on file  . Attends Archivist Meetings: Not on file  . Marital Status: Not on file  Intimate Partner Violence:   . Fear of Current or Ex-Partner: Not on file  . Emotionally Abused: Not on file  . Physically Abused: Not on file  . Sexually Abused: Not on file    Family History  Problem Relation Age of Onset  . Hyperlipidemia Father   . Heart disease Mother   . Hypertension Mother   . Hypertension Sister   . Heart disease Brother 6       CABG  . Hyperlipidemia Sister   . Hyperlipidemia Sister   . Esophageal cancer Cousin   . Healthy Child   . Colon cancer Neg Hx   . Stomach cancer Neg Hx   . Rectal cancer Neg Hx      Review of Systems  Constitutional: Negative.  Negative for chills  and fever.  HENT: Negative.  Negative for congestion and sore throat.   Respiratory: Negative.  Negative for cough and shortness of breath.   Cardiovascular: Negative.  Negative for chest pain and palpitations.  Gastrointestinal: Negative.  Negative for abdominal pain, diarrhea, nausea and vomiting.  Genitourinary: Negative.  Negative for dysuria and hematuria.  Musculoskeletal: Negative.  Negative for back pain, myalgias and neck pain.  Skin: Negative.  Negative for rash.  Neurological: Negative.  Negative for dizziness and headaches.  All other systems reviewed and are negative.   Vitals:   12/30/19 1530  BP: 136/70  Pulse: 76  Resp: 16  Temp: 98.2 F (36.8 C)  SpO2: 97%   BP Readings from Last 3 Encounters:  12/30/19 136/70  12/04/19 122/68  11/02/19 115/71    Physical Exam Vitals reviewed.  Constitutional:      Appearance: Normal appearance.  HENT:     Head: Normocephalic.  Eyes:     Extraocular Movements: Extraocular movements intact.     Pupils: Pupils are equal, round, and reactive to light.  Cardiovascular:     Rate and Rhythm: Normal rate and regular rhythm.     Pulses: Normal pulses.     Heart sounds: Normal heart sounds.  Pulmonary:     Effort: Pulmonary effort is normal.     Breath sounds: Normal breath sounds.  Musculoskeletal:     Cervical back: Normal range of motion and neck supple.  Skin:    General: Skin is warm and dry.     Capillary Refill: Capillary refill takes less than 2 seconds.  Neurological:     General: No focal deficit present.     Mental Status: He is alert and oriented to person, place, and time.  Psychiatric:        Mood and Affect: Mood normal.        Behavior: Behavior normal.     A total of 30 minutes was spent with the patient, greater than 50% of which was in counseling/coordination of care regarding multiple chronic medical problems, review of all medications, review of most recent office visit notes, hemodialysis and  possible complications, need to follow-up with renal transplant team, documentation, prognosis, and need for follow-up.  ASSESSMENT & PLAN: Clinically stable.  Well-controlled hypertension.  Continue present medications.  No changes.  Follow-up with nephrologist and renal transplant team. Follow-up here in 6 months.  [name] was seen today for hypertension.  Diagnoses and all orders for this visit:  Hypertensive heart disease with chronic systolic congestive heart failure ([place])  End stage renal disease on dialysis (HCC)  Chronic diastolic CHF (congestive heart failure) (HCC)  Essential hypertension  Hypertriglyceridemia  Other orders -     calcium acetate (PHOSLO) 667 MG capsule; Take 2 capsules (1,334 mg total) by mouth 3 (three) times daily with meals. -     benzoyl peroxide (BENZOYL PEROXIDE) 5 % external liquid; Apply topically 2 (two) times daily.    Patient Instructions       If you have lab work done today you will be contacted with your lab results within the next 2 weeks.  If you have not heard from [place] then please contact us. The fastest way to get your results is to register for My Chart.   IF you received an x-ray today, you will receive an invoice from [facility]. Please contact [facility] at [phone] with questions or concerns regarding your invoice.   IF you received labwork today, you will receive an invoice from [facility]. Please contact [facility]  at [phone] with questions or concerns regarding your invoice.   Our billing staff will not be able to assist you with questions regarding bills from these companies.  You will be contacted with the lab results as soon as they are available. The fastest way to get your results is to activate your My Chart account. Instructions are located on the last page of this paperwork. If you have not heard from [place] regarding the results in 2 weeks, please contact this office.     Hypertension,  Adult High blood pressure (hypertension) is when the force of blood pumping through the arteries is too strong. The arteries are the blood vessels that carry blood from the heart throughout the body. Hypertension forces the heart to work harder to pump blood and may cause arteries to become narrow or stiff. Untreated or uncontrolled hypertension can cause a heart attack, heart failure, a stroke, kidney disease, and other problems. A blood pressure reading consists of a higher number over a lower number. Ideally, your blood pressure should be below 120/80. The first ("top") number is called the systolic pressure. It is a measure of the pressure in your arteries as your heart beats. The second ("bottom") number is called the diastolic pressure. It is a measure of the pressure in your arteries as the heart relaxes. What are the causes? The exact cause of this condition is not known. There are some conditions that result in or are related to high blood pressure. What increases the risk? Some risk factors for high blood pressure are under your control. The following factors may make you more likely to develop this condition:  Smoking.  Having type 2 diabetes mellitus, high cholesterol, or both.  Not getting enough exercise or physical activity.  Being overweight.  Having too much fat, sugar, calories, or salt (sodium) in your diet.  Drinking too much alcohol. Some risk factors for high blood pressure may be difficult or impossible to change. Some of these factors include:  Having chronic kidney disease.  Having a family history of high blood pressure.  Age. Risk increases with age.  Race. You may be at higher risk if you are African American.  Gender. Men are at higher risk than women before age 46. After age 16, women are at higher risk than men.  Having obstructive sleep apnea.  Stress. What are the signs or symptoms? High blood pressure may not cause symptoms. Very high blood  pressure (hypertensive crisis) may cause:  Headache.  Anxiety.  Shortness of breath.  Nosebleed.  Nausea and vomiting.  Vision changes.  Severe chest pain.  Seizures. How is this diagnosed? This condition is diagnosed by measuring your blood pressure while you are seated, with your arm resting on a flat surface, your legs uncrossed, and your feet flat on the floor. The cuff of the blood pressure monitor will be placed directly against the skin of your upper arm at the level of your heart. It should be measured at least twice using the same arm. Certain conditions can cause a difference in blood pressure between your right and left arms. Certain factors can cause blood pressure readings to be lower or higher than normal for a short period of time:  When your blood pressure is higher when you are in a health care provider's office than when you are at home, this is called white coat hypertension. Most people with this condition do not need medicines.  When your blood pressure is higher at home than  when you are in a health care provider's office, this is called masked hypertension. Most people with this condition may need medicines to control blood pressure. If you have a high blood pressure reading during one visit or you have normal blood pressure with other risk factors, you may be asked to:  Return on a different day to have your blood pressure checked again.  Monitor your blood pressure at home for 1 week or longer. If you are diagnosed with hypertension, you may have other blood or imaging tests to help your health care provider understand your overall risk for other conditions. How is this treated? This condition is treated by making healthy lifestyle changes, such as eating healthy foods, exercising more, and reducing your alcohol intake. Your health care provider may prescribe medicine if lifestyle changes are not enough to get your blood pressure under control, and if:  Your  systolic blood pressure is above 130.  Your diastolic blood pressure is above 80. Your personal target blood pressure may vary depending on your medical conditions, your age, and other factors. Follow these instructions at home: Eating and drinking   Eat a diet that is high in fiber and potassium, and low in sodium, added sugar, and fat. An example eating plan is called the DASH (Dietary Approaches to Stop Hypertension) diet. To eat this way: ? Eat plenty of fresh fruits and vegetables. Try to fill one half of your plate at each meal with fruits and vegetables. ? Eat whole grains, such as whole-wheat pasta, brown rice, or whole-grain bread. Fill about one fourth of your plate with whole grains. ? Eat or drink low-fat dairy products, such as skim milk or low-fat yogurt. ? Avoid fatty cuts of meat, processed or cured meats, and poultry with skin. Fill about one fourth of your plate with lean proteins, such as fish, chicken without skin, beans, eggs, or tofu. ? Avoid pre-made and processed foods. These tend to be higher in sodium, added sugar, and fat.  Reduce your daily sodium intake. Most people with hypertension should eat less than 1,500 mg of sodium a day.  Do not drink alcohol if: ? Your health care provider tells you not to drink. ? You are pregnant, may be pregnant, or are planning to become pregnant.  If you drink alcohol: ? Limit how much you use to:  0-1 drink a day for women.  0-2 drinks a day for men. ? Be aware of how much alcohol is in your drink. In the [place] one drink equals one 12 oz bottle of beer (355 mL), one 5 oz glass of wine (148 mL), or one 1 oz glass of hard liquor (44 mL). Lifestyle   Work with your health care provider to maintain a healthy body weight or to lose weight. Ask what an ideal weight is for you.  Get at least 30 minutes of exercise most days of the week. Activities may include walking, swimming, or biking.  Include exercise to strengthen your  muscles (resistance exercise), such as Pilates or lifting weights, as part of your weekly exercise routine. Try to do these types of exercises for 30 minutes at least 3 days a week.  Do not use any products that contain nicotine or tobacco, such as cigarettes, e-cigarettes, and chewing tobacco. If you need help quitting, ask your health care provider.  Monitor your blood pressure at home as told by your health care provider.  Keep all follow-up visits as told by your health care provider.  This is important. Medicines  Take over-the-counter and prescription medicines only as told by your health care provider. Follow directions carefully. Blood pressure medicines must be taken as prescribed.  Do not skip doses of blood pressure medicine. Doing this puts you at risk for problems and can make the medicine less effective.  Ask your health care provider about side effects or reactions to medicines that you should watch for. Contact a health care provider if you:  Think you are having a reaction to a medicine you are taking.  Have headaches that keep coming back (recurring).  Feel dizzy.  Have swelling in your ankles.  Have trouble with your vision. Get help right away if you:  Develop a severe headache or confusion.  Have unusual weakness or numbness.  Feel faint.  Have severe pain in your chest or abdomen.  Vomit repeatedly.  Have trouble breathing. Summary  Hypertension is when the force of blood pumping through your arteries is too strong. If this condition is not controlled, it may put you at risk for serious complications.  Your personal target blood pressure may vary depending on your medical conditions, your age, and other factors. For most people, a normal blood pressure is less than 120/80.  Hypertension is treated with lifestyle changes, medicines, or a combination of both. Lifestyle changes include losing weight, eating a healthy, low-sodium diet, exercising more, and  limiting alcohol. This information is not intended to replace advice given to you by your health care provider. Make sure you discuss any questions you have with your health care provider. Document Revised: 10/11/2017 Document Reviewed: 10/11/2017 Elsevier Patient Education  2020 Elsevier Inc.      Agustina Caroli, MD [facility]

## 2020-01-27 DIAGNOSIS — Z87892 Personal history of anaphylaxis: Secondary | ICD-10-CM | POA: Insufficient documentation

## 2020-01-27 DIAGNOSIS — T7840XS Allergy, unspecified, sequela: Secondary | ICD-10-CM | POA: Insufficient documentation

## 2020-02-15 DIAGNOSIS — I129 Hypertensive chronic kidney disease with stage 1 through stage 4 chronic kidney disease, or unspecified chronic kidney disease: Secondary | ICD-10-CM | POA: Diagnosis not present

## 2020-02-15 DIAGNOSIS — N186 End stage renal disease: Secondary | ICD-10-CM | POA: Diagnosis not present

## 2020-02-15 DIAGNOSIS — Z992 Dependence on renal dialysis: Secondary | ICD-10-CM | POA: Diagnosis not present

## 2020-02-19 DIAGNOSIS — N2581 Secondary hyperparathyroidism of renal origin: Secondary | ICD-10-CM | POA: Diagnosis not present

## 2020-02-19 DIAGNOSIS — D631 Anemia in chronic kidney disease: Secondary | ICD-10-CM | POA: Diagnosis not present

## 2020-02-19 DIAGNOSIS — Z992 Dependence on renal dialysis: Secondary | ICD-10-CM | POA: Diagnosis not present

## 2020-02-19 DIAGNOSIS — N186 End stage renal disease: Secondary | ICD-10-CM | POA: Diagnosis not present

## 2020-02-20 DIAGNOSIS — K219 Gastro-esophageal reflux disease without esophagitis: Secondary | ICD-10-CM | POA: Diagnosis not present

## 2020-02-20 DIAGNOSIS — I10 Essential (primary) hypertension: Secondary | ICD-10-CM | POA: Diagnosis not present

## 2020-02-20 DIAGNOSIS — Z86010 Personal history of colonic polyps: Secondary | ICD-10-CM | POA: Diagnosis not present

## 2020-02-20 DIAGNOSIS — Z1159 Encounter for screening for other viral diseases: Secondary | ICD-10-CM | POA: Diagnosis not present

## 2020-02-20 DIAGNOSIS — I120 Hypertensive chronic kidney disease with stage 5 chronic kidney disease or end stage renal disease: Secondary | ICD-10-CM | POA: Diagnosis not present

## 2020-02-20 DIAGNOSIS — Z905 Acquired absence of kidney: Secondary | ICD-10-CM | POA: Diagnosis not present

## 2020-02-20 DIAGNOSIS — N186 End stage renal disease: Secondary | ICD-10-CM | POA: Diagnosis not present

## 2020-02-20 DIAGNOSIS — I132 Hypertensive heart and chronic kidney disease with heart failure and with stage 5 chronic kidney disease, or end stage renal disease: Secondary | ICD-10-CM | POA: Diagnosis not present

## 2020-02-20 DIAGNOSIS — Z7682 Awaiting organ transplant status: Secondary | ICD-10-CM | POA: Diagnosis not present

## 2020-02-20 DIAGNOSIS — Z992 Dependence on renal dialysis: Secondary | ICD-10-CM | POA: Diagnosis not present

## 2020-02-20 DIAGNOSIS — Z01818 Encounter for other preprocedural examination: Secondary | ICD-10-CM | POA: Diagnosis not present

## 2020-02-21 DIAGNOSIS — Z992 Dependence on renal dialysis: Secondary | ICD-10-CM | POA: Diagnosis not present

## 2020-02-21 DIAGNOSIS — N186 End stage renal disease: Secondary | ICD-10-CM | POA: Diagnosis not present

## 2020-02-21 DIAGNOSIS — D631 Anemia in chronic kidney disease: Secondary | ICD-10-CM | POA: Diagnosis not present

## 2020-02-21 DIAGNOSIS — N2581 Secondary hyperparathyroidism of renal origin: Secondary | ICD-10-CM | POA: Diagnosis not present

## 2020-02-24 DIAGNOSIS — N2581 Secondary hyperparathyroidism of renal origin: Secondary | ICD-10-CM | POA: Diagnosis not present

## 2020-02-24 DIAGNOSIS — D631 Anemia in chronic kidney disease: Secondary | ICD-10-CM | POA: Diagnosis not present

## 2020-02-24 DIAGNOSIS — Z992 Dependence on renal dialysis: Secondary | ICD-10-CM | POA: Diagnosis not present

## 2020-02-24 DIAGNOSIS — N186 End stage renal disease: Secondary | ICD-10-CM | POA: Diagnosis not present

## 2020-02-26 DIAGNOSIS — Z992 Dependence on renal dialysis: Secondary | ICD-10-CM | POA: Diagnosis not present

## 2020-02-26 DIAGNOSIS — N186 End stage renal disease: Secondary | ICD-10-CM | POA: Diagnosis not present

## 2020-02-26 DIAGNOSIS — D631 Anemia in chronic kidney disease: Secondary | ICD-10-CM | POA: Diagnosis not present

## 2020-02-26 DIAGNOSIS — N2581 Secondary hyperparathyroidism of renal origin: Secondary | ICD-10-CM | POA: Diagnosis not present

## 2020-02-28 DIAGNOSIS — Z992 Dependence on renal dialysis: Secondary | ICD-10-CM | POA: Diagnosis not present

## 2020-02-28 DIAGNOSIS — N2581 Secondary hyperparathyroidism of renal origin: Secondary | ICD-10-CM | POA: Diagnosis not present

## 2020-02-28 DIAGNOSIS — D631 Anemia in chronic kidney disease: Secondary | ICD-10-CM | POA: Diagnosis not present

## 2020-02-28 DIAGNOSIS — N186 End stage renal disease: Secondary | ICD-10-CM | POA: Diagnosis not present

## 2020-03-04 DIAGNOSIS — N2581 Secondary hyperparathyroidism of renal origin: Secondary | ICD-10-CM | POA: Diagnosis not present

## 2020-03-04 DIAGNOSIS — D631 Anemia in chronic kidney disease: Secondary | ICD-10-CM | POA: Diagnosis not present

## 2020-03-04 DIAGNOSIS — Z992 Dependence on renal dialysis: Secondary | ICD-10-CM | POA: Diagnosis not present

## 2020-03-04 DIAGNOSIS — N186 End stage renal disease: Secondary | ICD-10-CM | POA: Diagnosis not present

## 2020-03-06 DIAGNOSIS — D631 Anemia in chronic kidney disease: Secondary | ICD-10-CM | POA: Diagnosis not present

## 2020-03-06 DIAGNOSIS — Z992 Dependence on renal dialysis: Secondary | ICD-10-CM | POA: Diagnosis not present

## 2020-03-06 DIAGNOSIS — N186 End stage renal disease: Secondary | ICD-10-CM | POA: Diagnosis not present

## 2020-03-06 DIAGNOSIS — N2581 Secondary hyperparathyroidism of renal origin: Secondary | ICD-10-CM | POA: Diagnosis not present

## 2020-03-09 DIAGNOSIS — N186 End stage renal disease: Secondary | ICD-10-CM | POA: Diagnosis not present

## 2020-03-09 DIAGNOSIS — Z992 Dependence on renal dialysis: Secondary | ICD-10-CM | POA: Diagnosis not present

## 2020-03-09 DIAGNOSIS — N2581 Secondary hyperparathyroidism of renal origin: Secondary | ICD-10-CM | POA: Diagnosis not present

## 2020-03-09 DIAGNOSIS — D631 Anemia in chronic kidney disease: Secondary | ICD-10-CM | POA: Diagnosis not present

## 2020-03-11 DIAGNOSIS — Z992 Dependence on renal dialysis: Secondary | ICD-10-CM | POA: Diagnosis not present

## 2020-03-11 DIAGNOSIS — N2581 Secondary hyperparathyroidism of renal origin: Secondary | ICD-10-CM | POA: Diagnosis not present

## 2020-03-11 DIAGNOSIS — D631 Anemia in chronic kidney disease: Secondary | ICD-10-CM | POA: Diagnosis not present

## 2020-03-11 DIAGNOSIS — N186 End stage renal disease: Secondary | ICD-10-CM | POA: Diagnosis not present

## 2020-03-13 DIAGNOSIS — N186 End stage renal disease: Secondary | ICD-10-CM | POA: Diagnosis not present

## 2020-03-13 DIAGNOSIS — N2581 Secondary hyperparathyroidism of renal origin: Secondary | ICD-10-CM | POA: Diagnosis not present

## 2020-03-13 DIAGNOSIS — Z992 Dependence on renal dialysis: Secondary | ICD-10-CM | POA: Diagnosis not present

## 2020-03-13 DIAGNOSIS — D631 Anemia in chronic kidney disease: Secondary | ICD-10-CM | POA: Diagnosis not present

## 2020-03-16 ENCOUNTER — Telehealth: Payer: Self-pay | Admitting: Internal Medicine

## 2020-03-16 DIAGNOSIS — N186 End stage renal disease: Secondary | ICD-10-CM | POA: Diagnosis not present

## 2020-03-16 DIAGNOSIS — N2581 Secondary hyperparathyroidism of renal origin: Secondary | ICD-10-CM | POA: Diagnosis not present

## 2020-03-16 DIAGNOSIS — D631 Anemia in chronic kidney disease: Secondary | ICD-10-CM | POA: Diagnosis not present

## 2020-03-16 DIAGNOSIS — Z992 Dependence on renal dialysis: Secondary | ICD-10-CM | POA: Diagnosis not present

## 2020-03-16 NOTE — Telephone Encounter (Signed)
OK to work in to non-lipid day as needed Routed to scheduling team to assist

## 2020-03-16 NOTE — Telephone Encounter (Signed)
[name] is calling due to having to cancel his upcoming appt on 03/19/20 with Dr. Debara Pickett for Lipid. He states he will be in [place] that day and wishes to reschedule. He does not want a virtual and he cannot do Monday's, Wednesday's, or Friday's due to taking dialysis.  I was unable to find any Lipid slots available that was not Monday, Wednesday, or Friday. He would like to know if he can be worked in on a nonlipid day. Please advise.

## 2020-03-17 DIAGNOSIS — I129 Hypertensive chronic kidney disease with stage 1 through stage 4 chronic kidney disease, or unspecified chronic kidney disease: Secondary | ICD-10-CM | POA: Diagnosis not present

## 2020-03-17 DIAGNOSIS — Z992 Dependence on renal dialysis: Secondary | ICD-10-CM | POA: Diagnosis not present

## 2020-03-17 DIAGNOSIS — N186 End stage renal disease: Secondary | ICD-10-CM | POA: Diagnosis not present

## 2020-03-18 DIAGNOSIS — N2581 Secondary hyperparathyroidism of renal origin: Secondary | ICD-10-CM | POA: Diagnosis not present

## 2020-03-18 DIAGNOSIS — D509 Iron deficiency anemia, unspecified: Secondary | ICD-10-CM | POA: Diagnosis not present

## 2020-03-18 DIAGNOSIS — R52 Pain, unspecified: Secondary | ICD-10-CM | POA: Diagnosis not present

## 2020-03-18 DIAGNOSIS — Z992 Dependence on renal dialysis: Secondary | ICD-10-CM | POA: Diagnosis not present

## 2020-03-18 DIAGNOSIS — N186 End stage renal disease: Secondary | ICD-10-CM | POA: Diagnosis not present

## 2020-03-18 DIAGNOSIS — D631 Anemia in chronic kidney disease: Secondary | ICD-10-CM | POA: Diagnosis not present

## 2020-03-19 ENCOUNTER — Ambulatory Visit: Payer: Medicare Other | Admitting: Internal Medicine

## 2020-03-20 DIAGNOSIS — R52 Pain, unspecified: Secondary | ICD-10-CM | POA: Diagnosis not present

## 2020-03-20 DIAGNOSIS — N186 End stage renal disease: Secondary | ICD-10-CM | POA: Diagnosis not present

## 2020-03-20 DIAGNOSIS — D631 Anemia in chronic kidney disease: Secondary | ICD-10-CM | POA: Diagnosis not present

## 2020-03-20 DIAGNOSIS — D509 Iron deficiency anemia, unspecified: Secondary | ICD-10-CM | POA: Diagnosis not present

## 2020-03-20 DIAGNOSIS — Z992 Dependence on renal dialysis: Secondary | ICD-10-CM | POA: Diagnosis not present

## 2020-03-20 DIAGNOSIS — N2581 Secondary hyperparathyroidism of renal origin: Secondary | ICD-10-CM | POA: Diagnosis not present

## 2020-03-23 DIAGNOSIS — Z992 Dependence on renal dialysis: Secondary | ICD-10-CM | POA: Diagnosis not present

## 2020-03-23 DIAGNOSIS — N2581 Secondary hyperparathyroidism of renal origin: Secondary | ICD-10-CM | POA: Diagnosis not present

## 2020-03-23 DIAGNOSIS — R52 Pain, unspecified: Secondary | ICD-10-CM | POA: Diagnosis not present

## 2020-03-23 DIAGNOSIS — D509 Iron deficiency anemia, unspecified: Secondary | ICD-10-CM | POA: Diagnosis not present

## 2020-03-23 DIAGNOSIS — N186 End stage renal disease: Secondary | ICD-10-CM | POA: Diagnosis not present

## 2020-03-23 DIAGNOSIS — D631 Anemia in chronic kidney disease: Secondary | ICD-10-CM | POA: Diagnosis not present

## 2020-03-25 DIAGNOSIS — D631 Anemia in chronic kidney disease: Secondary | ICD-10-CM | POA: Diagnosis not present

## 2020-03-25 DIAGNOSIS — N2581 Secondary hyperparathyroidism of renal origin: Secondary | ICD-10-CM | POA: Diagnosis not present

## 2020-03-25 DIAGNOSIS — R52 Pain, unspecified: Secondary | ICD-10-CM | POA: Diagnosis not present

## 2020-03-25 DIAGNOSIS — N186 End stage renal disease: Secondary | ICD-10-CM | POA: Diagnosis not present

## 2020-03-25 DIAGNOSIS — D509 Iron deficiency anemia, unspecified: Secondary | ICD-10-CM | POA: Diagnosis not present

## 2020-03-25 DIAGNOSIS — Z992 Dependence on renal dialysis: Secondary | ICD-10-CM | POA: Diagnosis not present

## 2020-03-27 DIAGNOSIS — N2581 Secondary hyperparathyroidism of renal origin: Secondary | ICD-10-CM | POA: Diagnosis not present

## 2020-03-27 DIAGNOSIS — Z992 Dependence on renal dialysis: Secondary | ICD-10-CM | POA: Diagnosis not present

## 2020-03-27 DIAGNOSIS — D509 Iron deficiency anemia, unspecified: Secondary | ICD-10-CM | POA: Diagnosis not present

## 2020-03-27 DIAGNOSIS — D631 Anemia in chronic kidney disease: Secondary | ICD-10-CM | POA: Diagnosis not present

## 2020-03-27 DIAGNOSIS — N186 End stage renal disease: Secondary | ICD-10-CM | POA: Diagnosis not present

## 2020-03-27 DIAGNOSIS — R52 Pain, unspecified: Secondary | ICD-10-CM | POA: Diagnosis not present

## 2020-03-30 DIAGNOSIS — N2581 Secondary hyperparathyroidism of renal origin: Secondary | ICD-10-CM | POA: Diagnosis not present

## 2020-03-30 DIAGNOSIS — R52 Pain, unspecified: Secondary | ICD-10-CM | POA: Diagnosis not present

## 2020-03-30 DIAGNOSIS — N186 End stage renal disease: Secondary | ICD-10-CM | POA: Diagnosis not present

## 2020-03-30 DIAGNOSIS — D631 Anemia in chronic kidney disease: Secondary | ICD-10-CM | POA: Diagnosis not present

## 2020-03-30 DIAGNOSIS — Z992 Dependence on renal dialysis: Secondary | ICD-10-CM | POA: Diagnosis not present

## 2020-03-30 DIAGNOSIS — D509 Iron deficiency anemia, unspecified: Secondary | ICD-10-CM | POA: Diagnosis not present

## 2020-04-01 DIAGNOSIS — R52 Pain, unspecified: Secondary | ICD-10-CM | POA: Diagnosis not present

## 2020-04-01 DIAGNOSIS — Z992 Dependence on renal dialysis: Secondary | ICD-10-CM | POA: Diagnosis not present

## 2020-04-01 DIAGNOSIS — D509 Iron deficiency anemia, unspecified: Secondary | ICD-10-CM | POA: Diagnosis not present

## 2020-04-01 DIAGNOSIS — N2581 Secondary hyperparathyroidism of renal origin: Secondary | ICD-10-CM | POA: Diagnosis not present

## 2020-04-01 DIAGNOSIS — N186 End stage renal disease: Secondary | ICD-10-CM | POA: Diagnosis not present

## 2020-04-01 DIAGNOSIS — D631 Anemia in chronic kidney disease: Secondary | ICD-10-CM | POA: Diagnosis not present

## 2020-04-02 DIAGNOSIS — Z008 Encounter for other general examination: Secondary | ICD-10-CM | POA: Diagnosis not present

## 2020-04-02 DIAGNOSIS — F54 Psychological and behavioral factors associated with disorders or diseases classified elsewhere: Secondary | ICD-10-CM | POA: Diagnosis not present

## 2020-04-02 DIAGNOSIS — Z7682 Awaiting organ transplant status: Secondary | ICD-10-CM | POA: Diagnosis not present

## 2020-04-03 DIAGNOSIS — N186 End stage renal disease: Secondary | ICD-10-CM | POA: Diagnosis not present

## 2020-04-03 DIAGNOSIS — N2581 Secondary hyperparathyroidism of renal origin: Secondary | ICD-10-CM | POA: Diagnosis not present

## 2020-04-03 DIAGNOSIS — Z992 Dependence on renal dialysis: Secondary | ICD-10-CM | POA: Diagnosis not present

## 2020-04-03 DIAGNOSIS — D509 Iron deficiency anemia, unspecified: Secondary | ICD-10-CM | POA: Diagnosis not present

## 2020-04-03 DIAGNOSIS — R52 Pain, unspecified: Secondary | ICD-10-CM | POA: Diagnosis not present

## 2020-04-03 DIAGNOSIS — D631 Anemia in chronic kidney disease: Secondary | ICD-10-CM | POA: Diagnosis not present

## 2020-04-06 ENCOUNTER — Other Ambulatory Visit: Payer: Self-pay | Admitting: Nephrology

## 2020-04-06 DIAGNOSIS — M542 Cervicalgia: Secondary | ICD-10-CM

## 2020-04-06 DIAGNOSIS — D631 Anemia in chronic kidney disease: Secondary | ICD-10-CM | POA: Diagnosis not present

## 2020-04-06 DIAGNOSIS — D509 Iron deficiency anemia, unspecified: Secondary | ICD-10-CM | POA: Diagnosis not present

## 2020-04-06 DIAGNOSIS — Z992 Dependence on renal dialysis: Secondary | ICD-10-CM | POA: Diagnosis not present

## 2020-04-06 DIAGNOSIS — R519 Headache, unspecified: Secondary | ICD-10-CM

## 2020-04-06 DIAGNOSIS — N2581 Secondary hyperparathyroidism of renal origin: Secondary | ICD-10-CM | POA: Diagnosis not present

## 2020-04-06 DIAGNOSIS — R52 Pain, unspecified: Secondary | ICD-10-CM | POA: Diagnosis not present

## 2020-04-06 DIAGNOSIS — N186 End stage renal disease: Secondary | ICD-10-CM | POA: Diagnosis not present

## 2020-04-08 DIAGNOSIS — N186 End stage renal disease: Secondary | ICD-10-CM | POA: Diagnosis not present

## 2020-04-08 DIAGNOSIS — D509 Iron deficiency anemia, unspecified: Secondary | ICD-10-CM | POA: Diagnosis not present

## 2020-04-08 DIAGNOSIS — R52 Pain, unspecified: Secondary | ICD-10-CM | POA: Diagnosis not present

## 2020-04-08 DIAGNOSIS — N2581 Secondary hyperparathyroidism of renal origin: Secondary | ICD-10-CM | POA: Diagnosis not present

## 2020-04-08 DIAGNOSIS — Z992 Dependence on renal dialysis: Secondary | ICD-10-CM | POA: Diagnosis not present

## 2020-04-08 DIAGNOSIS — D631 Anemia in chronic kidney disease: Secondary | ICD-10-CM | POA: Diagnosis not present

## 2020-04-10 DIAGNOSIS — N2581 Secondary hyperparathyroidism of renal origin: Secondary | ICD-10-CM | POA: Diagnosis not present

## 2020-04-10 DIAGNOSIS — N186 End stage renal disease: Secondary | ICD-10-CM | POA: Diagnosis not present

## 2020-04-10 DIAGNOSIS — R52 Pain, unspecified: Secondary | ICD-10-CM | POA: Diagnosis not present

## 2020-04-10 DIAGNOSIS — D631 Anemia in chronic kidney disease: Secondary | ICD-10-CM | POA: Diagnosis not present

## 2020-04-10 DIAGNOSIS — Z992 Dependence on renal dialysis: Secondary | ICD-10-CM | POA: Diagnosis not present

## 2020-04-10 DIAGNOSIS — D509 Iron deficiency anemia, unspecified: Secondary | ICD-10-CM | POA: Diagnosis not present

## 2020-04-13 DIAGNOSIS — D631 Anemia in chronic kidney disease: Secondary | ICD-10-CM | POA: Diagnosis not present

## 2020-04-13 DIAGNOSIS — Z992 Dependence on renal dialysis: Secondary | ICD-10-CM | POA: Diagnosis not present

## 2020-04-13 DIAGNOSIS — N2581 Secondary hyperparathyroidism of renal origin: Secondary | ICD-10-CM | POA: Diagnosis not present

## 2020-04-13 DIAGNOSIS — R52 Pain, unspecified: Secondary | ICD-10-CM | POA: Diagnosis not present

## 2020-04-13 DIAGNOSIS — N186 End stage renal disease: Secondary | ICD-10-CM | POA: Diagnosis not present

## 2020-04-13 DIAGNOSIS — D509 Iron deficiency anemia, unspecified: Secondary | ICD-10-CM | POA: Diagnosis not present

## 2020-04-15 DIAGNOSIS — N186 End stage renal disease: Secondary | ICD-10-CM | POA: Diagnosis not present

## 2020-04-15 DIAGNOSIS — N2581 Secondary hyperparathyroidism of renal origin: Secondary | ICD-10-CM | POA: Diagnosis not present

## 2020-04-15 DIAGNOSIS — Z992 Dependence on renal dialysis: Secondary | ICD-10-CM | POA: Diagnosis not present

## 2020-04-15 DIAGNOSIS — D509 Iron deficiency anemia, unspecified: Secondary | ICD-10-CM | POA: Diagnosis not present

## 2020-04-17 DIAGNOSIS — N186 End stage renal disease: Secondary | ICD-10-CM | POA: Diagnosis not present

## 2020-04-17 DIAGNOSIS — N2581 Secondary hyperparathyroidism of renal origin: Secondary | ICD-10-CM | POA: Diagnosis not present

## 2020-04-17 DIAGNOSIS — D509 Iron deficiency anemia, unspecified: Secondary | ICD-10-CM | POA: Diagnosis not present

## 2020-04-17 DIAGNOSIS — Z992 Dependence on renal dialysis: Secondary | ICD-10-CM | POA: Diagnosis not present

## 2020-04-20 DIAGNOSIS — D509 Iron deficiency anemia, unspecified: Secondary | ICD-10-CM | POA: Diagnosis not present

## 2020-04-20 DIAGNOSIS — N2581 Secondary hyperparathyroidism of renal origin: Secondary | ICD-10-CM | POA: Diagnosis not present

## 2020-04-20 DIAGNOSIS — N186 End stage renal disease: Secondary | ICD-10-CM | POA: Diagnosis not present

## 2020-04-20 DIAGNOSIS — Z992 Dependence on renal dialysis: Secondary | ICD-10-CM | POA: Diagnosis not present

## 2020-04-21 ENCOUNTER — Other Ambulatory Visit: Payer: Medicare Other

## 2020-04-21 ENCOUNTER — Other Ambulatory Visit: Payer: Self-pay

## 2020-04-21 ENCOUNTER — Ambulatory Visit
Admission: RE | Admit: 2020-04-21 | Discharge: 2020-04-21 | Disposition: A | Discharge status: Home / Self Care | Payer: Medicare Other | Source: Ambulatory Visit | Attending: Nephrology | Admitting: Nephrology

## 2020-04-21 DIAGNOSIS — M542 Cervicalgia: Secondary | ICD-10-CM

## 2020-04-21 DIAGNOSIS — R519 Headache, unspecified: Secondary | ICD-10-CM

## 2020-04-22 DIAGNOSIS — N186 End stage renal disease: Secondary | ICD-10-CM | POA: Diagnosis not present

## 2020-04-22 DIAGNOSIS — N2581 Secondary hyperparathyroidism of renal origin: Secondary | ICD-10-CM | POA: Diagnosis not present

## 2020-04-22 DIAGNOSIS — D509 Iron deficiency anemia, unspecified: Secondary | ICD-10-CM | POA: Diagnosis not present

## 2020-04-22 DIAGNOSIS — Z992 Dependence on renal dialysis: Secondary | ICD-10-CM | POA: Diagnosis not present

## 2020-04-23 DIAGNOSIS — Z7185 Encounter for immunization safety counseling: Secondary | ICD-10-CM | POA: Diagnosis not present

## 2020-04-23 DIAGNOSIS — Z992 Dependence on renal dialysis: Secondary | ICD-10-CM | POA: Diagnosis not present

## 2020-04-23 DIAGNOSIS — Z23 Encounter for immunization: Secondary | ICD-10-CM | POA: Diagnosis not present

## 2020-04-23 DIAGNOSIS — Z7682 Awaiting organ transplant status: Secondary | ICD-10-CM | POA: Diagnosis not present

## 2020-04-23 DIAGNOSIS — Z789 Other specified health status: Secondary | ICD-10-CM | POA: Diagnosis not present

## 2020-04-23 DIAGNOSIS — N186 End stage renal disease: Secondary | ICD-10-CM | POA: Diagnosis not present

## 2020-04-23 DIAGNOSIS — Z905 Acquired absence of kidney: Secondary | ICD-10-CM | POA: Diagnosis not present

## 2020-04-23 DIAGNOSIS — I120 Hypertensive chronic kidney disease with stage 5 chronic kidney disease or end stage renal disease: Secondary | ICD-10-CM | POA: Diagnosis not present

## 2020-04-23 DIAGNOSIS — Z01818 Encounter for other preprocedural examination: Secondary | ICD-10-CM | POA: Diagnosis not present

## 2020-04-24 DIAGNOSIS — D509 Iron deficiency anemia, unspecified: Secondary | ICD-10-CM | POA: Diagnosis not present

## 2020-04-24 DIAGNOSIS — Z992 Dependence on renal dialysis: Secondary | ICD-10-CM | POA: Diagnosis not present

## 2020-04-24 DIAGNOSIS — N186 End stage renal disease: Secondary | ICD-10-CM | POA: Diagnosis not present

## 2020-04-24 DIAGNOSIS — N2581 Secondary hyperparathyroidism of renal origin: Secondary | ICD-10-CM | POA: Diagnosis not present

## 2020-04-27 DIAGNOSIS — Z992 Dependence on renal dialysis: Secondary | ICD-10-CM | POA: Diagnosis not present

## 2020-04-27 DIAGNOSIS — N186 End stage renal disease: Secondary | ICD-10-CM | POA: Diagnosis not present

## 2020-04-27 DIAGNOSIS — N2581 Secondary hyperparathyroidism of renal origin: Secondary | ICD-10-CM | POA: Diagnosis not present

## 2020-04-29 DIAGNOSIS — N186 End stage renal disease: Secondary | ICD-10-CM | POA: Diagnosis not present

## 2020-04-29 DIAGNOSIS — Z992 Dependence on renal dialysis: Secondary | ICD-10-CM | POA: Diagnosis not present

## 2020-04-29 DIAGNOSIS — N2581 Secondary hyperparathyroidism of renal origin: Secondary | ICD-10-CM | POA: Diagnosis not present

## 2020-05-01 DIAGNOSIS — N186 End stage renal disease: Secondary | ICD-10-CM | POA: Diagnosis not present

## 2020-05-01 DIAGNOSIS — N2581 Secondary hyperparathyroidism of renal origin: Secondary | ICD-10-CM | POA: Diagnosis not present

## 2020-05-01 DIAGNOSIS — Z992 Dependence on renal dialysis: Secondary | ICD-10-CM | POA: Diagnosis not present

## 2020-05-04 DIAGNOSIS — Z992 Dependence on renal dialysis: Secondary | ICD-10-CM | POA: Diagnosis not present

## 2020-05-04 DIAGNOSIS — N2581 Secondary hyperparathyroidism of renal origin: Secondary | ICD-10-CM | POA: Diagnosis not present

## 2020-05-04 DIAGNOSIS — D631 Anemia in chronic kidney disease: Secondary | ICD-10-CM | POA: Diagnosis not present

## 2020-05-04 DIAGNOSIS — N186 End stage renal disease: Secondary | ICD-10-CM | POA: Diagnosis not present

## 2020-05-04 DIAGNOSIS — D509 Iron deficiency anemia, unspecified: Secondary | ICD-10-CM | POA: Diagnosis not present

## 2020-05-06 DIAGNOSIS — D509 Iron deficiency anemia, unspecified: Secondary | ICD-10-CM | POA: Diagnosis not present

## 2020-05-06 DIAGNOSIS — Z992 Dependence on renal dialysis: Secondary | ICD-10-CM | POA: Diagnosis not present

## 2020-05-06 DIAGNOSIS — N2581 Secondary hyperparathyroidism of renal origin: Secondary | ICD-10-CM | POA: Diagnosis not present

## 2020-05-06 DIAGNOSIS — N186 End stage renal disease: Secondary | ICD-10-CM | POA: Diagnosis not present

## 2020-05-06 DIAGNOSIS — D631 Anemia in chronic kidney disease: Secondary | ICD-10-CM | POA: Diagnosis not present

## 2020-05-08 DIAGNOSIS — N2581 Secondary hyperparathyroidism of renal origin: Secondary | ICD-10-CM | POA: Diagnosis not present

## 2020-05-08 DIAGNOSIS — N186 End stage renal disease: Secondary | ICD-10-CM | POA: Diagnosis not present

## 2020-05-08 DIAGNOSIS — D631 Anemia in chronic kidney disease: Secondary | ICD-10-CM | POA: Diagnosis not present

## 2020-05-08 DIAGNOSIS — D509 Iron deficiency anemia, unspecified: Secondary | ICD-10-CM | POA: Diagnosis not present

## 2020-05-08 DIAGNOSIS — Z992 Dependence on renal dialysis: Secondary | ICD-10-CM | POA: Diagnosis not present

## 2020-05-11 DIAGNOSIS — N2581 Secondary hyperparathyroidism of renal origin: Secondary | ICD-10-CM | POA: Diagnosis not present

## 2020-05-11 DIAGNOSIS — N186 End stage renal disease: Secondary | ICD-10-CM | POA: Diagnosis not present

## 2020-05-11 DIAGNOSIS — D509 Iron deficiency anemia, unspecified: Secondary | ICD-10-CM | POA: Diagnosis not present

## 2020-05-11 DIAGNOSIS — Z992 Dependence on renal dialysis: Secondary | ICD-10-CM | POA: Diagnosis not present

## 2020-05-12 ENCOUNTER — Encounter: Payer: Self-pay | Admitting: Internal Medicine

## 2020-05-12 ENCOUNTER — Ambulatory Visit (INDEPENDENT_AMBULATORY_CARE_PROVIDER_SITE_OTHER): Payer: Medicare Other | Admitting: Internal Medicine

## 2020-05-12 ENCOUNTER — Other Ambulatory Visit: Payer: Self-pay

## 2020-05-12 VITALS — BP 146/83 | HR 92 | Ht 69.0 in | Wt 245.4 lb

## 2020-05-12 DIAGNOSIS — N186 End stage renal disease: Secondary | ICD-10-CM

## 2020-05-12 DIAGNOSIS — E781 Pure hyperglyceridemia: Secondary | ICD-10-CM | POA: Diagnosis not present

## 2020-05-12 DIAGNOSIS — Z992 Dependence on renal dialysis: Secondary | ICD-10-CM

## 2020-05-12 DIAGNOSIS — Z789 Other specified health status: Secondary | ICD-10-CM | POA: Diagnosis not present

## 2020-05-12 DIAGNOSIS — E785 Hyperlipidemia, unspecified: Secondary | ICD-10-CM | POA: Diagnosis not present

## 2020-05-12 MED ORDER — ICOSAPENT ETHYL 1 G PO CAPS: 2.0000 g | ORAL_CAPSULE | Freq: Two times a day (BID) | ORAL | 11 refills | Status: DC

## 2020-05-12 NOTE — Progress Notes (Signed)
LIPID CLINIC CONSULT NOTE  Chief Complaint:  Follow-up dyslipidemia  Primary Care Physician: Horald Pollen, MD  Primary Cardiologist:  Ena Dawley, MD  HPI:  [name] is a 56 y.o. male who is being seen today for the evaluation of dyslipidemia at the request of Horald Pollen, *.  This is a pleasant 55 year old Arabic male who was seen today with a Psychologist, sport and exercise.  His past medical history significant for chronic kidney disease which is stage IV and currently a GFR of around 10.  He has been followed by [name] with nephrology and has an appointment in October to see Dr. Carlis Abbott with vascular surgery for creation of a fistula.  He has had longstanding dyslipidemia and multiple medication intolerances.  He was previously followed in the [facility] lipid clinic by [name], PharmD.  When he was last seen in November 2019, he was on rosuvastatin 10 mg daily and Lovaza 2 g twice daily.  He was reportedly intolerance to atorvastatin 80 mg causing myalgias and fenofibrate 160 mg causing myalgias.  I reviewed the medicine list with him today and looked at all the medications that were in his possession.  After working with the translator we determined that he was still taking the rosuvastatin 10 mg daily and also fenofibrate 160 mg daily.  He did not have any of the Lovaza.  Previously he was also evaluated apparently for Vascepa but felt to be unable to obtain that due to financial constraints.  He is working and does have [employer] but is in application for disability.  There is a strong family history of coronary disease in multiple family members.  His only other complaint today is some shortness of breath and next edema.  Is difficult to know whether shortness of breath may be related to advanced chronic kidney disease or coronary disease.  Given his advanced creatinine, it is unlikely that he could have any dye related cardiovascular work-up.  His last stress  test was in 2015.  His most recent lipids from August 24, 2018 showed total cholesterol 313, triglycerides 1340, HDL 24 and direct LDL of 63.  03/12/2019  [name] returns today for follow-up of hypertriglyceridemia.  He has been taking Vascepa 2 g twice daily.  This has caused a resultant decrease in his triglycerides from 1340-741.  Although this is still elevated, represents a significant reduction.  It is difficult to get his triglycerides much lower given his underlying renal disease.  Unfortunately he was not a candidate for a fibrate.  He reports other medication intolerances.  With this medicine he has says through the translator that he thinks he is having headaches and possibly some abdominal pain.  I advised him to decrease the dose to 1 g twice daily, although not as effective, it may have less chance for side effect.  If this is better tolerated he should continue it and if not then he will have no choice but to stop it.  I do not see any other options for lowering triglycerides at this time other than strict diet a very low saturated fats less than 5 to 10% of calories.  I provided him with this information today including dietary recommendations.  09/02/2019  [name] was seen today for follow-up with a professional Fish farm manager via the [employer]..  He had marked improvement in his triglycerides on Vascepa however unfortunately had progressive renal dysfunction and ultimately has gone on dialysis.  Prior to that  he was having headaches and other symptoms which I believe are related to end-stage renal disease however he felt it could be related to the Vascepa and discontinue the medicine.  Not surprisingly his triglycerides have accordingly increased.  At one point triglycerides were well over thousand and had come down to 305 but recently went up to 607.  12/04/2019  [name] is seen today in follow-up.  He is not accompanied by a Optometrist.  Unfortunately his English  is difficult to understand.  I did note that his lipids have essentially been unchanged.  Triglycerides are still elevated at 746 despite the fact that he reports taking the Vascepa 2 g twice daily.  He is on dialysis.  Unfortunately he recently had COVID-19 was treated with monoclonal antibody therapy about a month ago.  He says he is recovered from that.  05/12/2020  [name] returns for follow-up.  He has not had recent lipid testing.  He reports compliance with his Vascepa.  He says he is having some difficulty getting it.  Triglycerides have been significantly elevated.  Because he is end-stage renal disease he is not on a statin.  He is followed by Dr. Carolin Sicks.  He recently been having some issues with headache after dialysis.  A CT scan was ordered of the head and neck and performed in early March and was negative.  I provided those results to them today.  PMHx:  Past Medical History:  Diagnosis Date  . Anemia   . Back pain   . CKD (chronic kidney disease), stage III (HCC)    Stage 4  . Colon polyps    adenomatous  . Elevated cholesterol   . History of echocardiogram    Echo 11/17: EF 65-70, normal wall motion, grade 1 diastolic dysfunction, trivial MR, mild LAE, mild TR  . Hyperlipidemia   . Hypertension    no current bp meds for last 3 months  . Kidney stones 2007  . Medication intolerance    a. multiple with prior nonadherence to regimen.  . Nausea    " in the morning since having the kidney problem "  . Otosclerosis of both ears   . Pneumonia   . PONV (postoperative nausea and vomiting)   . PUD (peptic ulcer disease)    Has had unspecified surgery for this  . Sleep apnea     Past Surgical History:  Procedure Laterality Date  . AV FISTULA PLACEMENT Left 01/04/2019   Procedure: ARTERIOVENOUS (AV) FISTULA CREATION LEFT ARM;  Surgeon: [name], MD;  Location: [facility];  Service: Vascular;  Laterality: Left;  . BASCILIC VEIN TRANSPOSITION Left 03/11/2019    Procedure: BASILIC VEIN TRANSPOSITION 2ND STAGE LEFT;  Surgeon: [name], MD;  Location: [facility];  Service: Vascular;  Laterality: Left;  . NEPHRECTOMY  02/18/2011   Procedure: NEPHRECTOMY;  Surgeon: Hanley Ben, MD;  Location: [facility];  Service: Urology;  Laterality: Right;  . SMALL INTESTINE SURGERY    . STAPEDOTOMY  2005   lt ear jan, right ear sept  . surgery for ulcers  1990    FAMHx:  Family History  Problem Relation Age of Onset  . Hyperlipidemia Father   . Heart disease Mother   . Hypertension Mother   . Hypertension Sister   . Heart disease Brother 37       CABG  . Hyperlipidemia Sister   . Hyperlipidemia Sister   . Esophageal cancer Cousin   . Healthy Child   .  Colon cancer Neg Hx   . Stomach cancer Neg Hx   . Rectal cancer Neg Hx     SOCHx:   reports that he has never smoked. He has never used smokeless tobacco. He reports that he does not drink alcohol and does not use drugs.  ALLERGIES:  Allergies  Allergen Reactions  . Lokelma [Sodium Zirconium Cyclosilicate] Shortness Of Breath  . Beef-Derived Products Other (See Comments)    Cultural preference  . Pegademase Bovine Other (See Comments)    Cultural preference  . Poractant Alfa Other (See Comments)    Cultural preference  . Pork-Derived Products Other (See Comments)    Cultural preference    ROS: Pertinent items noted in HPI and remainder of comprehensive ROS otherwise negative.  HOME MEDS: Current Outpatient Medications on File Prior to Visit  Medication Sig Dispense Refill  . acetaminophen (TYLENOL) 325 MG tablet Take by mouth.    . benzoyl peroxide (BENZOYL PEROXIDE) 5 % external liquid Apply topically 2 (two) times daily. 142 g 12  . Betamethasone Sodium Phosphate 6 MG/ML SOLN INJECT 2:4 SYRINGE BILATERALLY INTO EACH KNEE JOINT    . calcium acetate (PHOSLO) 667 MG capsule Take 2 capsules (1,334 mg total) by mouth 3 (three) times daily with meals. 180 capsule 3  . icosapent Ethyl  (VASCEPA) 1 g capsule Take 2 capsules (2 g total) by mouth 2 (two) times daily. 120 capsule 11  . iron sucrose in sodium chloride 0.9 % 100 mL Iron Sucrose (Venofer)    . lidocaine-prilocaine (EMLA) cream Apply topically.    . Methoxy PEG-Epoetin Beta (MIRCERA IJ) Mircera    . pantoprazole (PROTONIX) 40 MG tablet Take 1 tablet (40 mg total) by mouth daily. 30 tablet 5  . VITAMIN D PO Take by mouth.    Marland Kitchen amLODipine (NORVASC) 5 MG tablet Take 1 tablet (5 mg total) by mouth daily. 30 tablet 0  . carvedilol (COREG) 25 MG tablet Take 1 tablet (25 mg total) by mouth 2 (two) times daily with a meal. 60 tablet 0  . hydrALAZINE (APRESOLINE) 25 MG tablet Take 1 tablet (25 mg total) by mouth every 8 (eight) hours. 90 tablet 0   No current facility-administered medications on file prior to visit.    LABS/IMAGING: No results found for this or any previous visit (from the past 48 hour(s)). No results found.  LIPID PANEL:    Component Value Date/Time   CHOL 264 (H) 12/02/2019 1407   TRIG 746 (HH) 12/02/2019 1407   HDL 24 (L) 12/02/2019 1407   CHOLHDL 11.0 (H) 12/02/2019 1407   CHOLHDL 8.5 04/13/2019 0450   VLDL 61 (H) 04/13/2019 0450   LDLCALC 108 (H) 12/02/2019 1407   LDLDIRECT 63 08/24/2018 1458   LDLDIRECT 139.0 05/06/2014 1209    WEIGHTS: Wt Readings from Last 3 Encounters:  05/12/20 245 lb 6.4 oz (111.3 kg)  12/30/19 246 lb (111.6 kg)  12/04/19 244 lb 9.6 oz (110.9 kg)    VITALS: BP (!) 146/83   Pulse 92   Ht 5\' 9"  (1.753 m)   Wt 245 lb 6.4 oz (111.3 kg)   SpO2 98%   BMI 36.24 kg/m   EXAM: Deferred  EKG: Deferred  ASSESSMENT: 1. Secondary hypertriglyceridemia secondary to advanced kidney disease 2. Progressive dyspnea on exertion, ? coronary equivalent 3. ESRD-now on dialysis  PLAN: 1.   [name] is due for repeat lipids.  He is on Vascepa.  His triglycerides have been high.  He has not pursuing  renal transplant at this point.  He has had some issues with headaches.   His nephrologist had ordered a CT scan of the head and neck which was negative.  He was not seen with a translator today and I feel that there is somewhat of a language barrier between [place].  We will go ahead and refill his Vascepa.  We will continue with that for the time being.  Follow-up annually or sooner as necessary for lipids only.  Pixie Casino, MD, [facility], [place] Director of the Advanced Lipid Disorders &  Cardiovascular Risk Reduction Clinic Diplomate of the American Board of Clinical Lipidology Attending Cardiologist  Direct Dial: [phone]  Fax: [phone]  Website:  [email or web address] 05/12/2020, 2:49 PM

## 2020-05-12 NOTE — Addendum Note (Signed)
Addended by: Fidel Levy on: 05/12/2020 03:03 PM   Modules accepted: Orders

## 2020-05-12 NOTE — Patient Instructions (Signed)
Medication Instructions:  Your physician recommends that you continue on your current medications as directed. Please refer to the Current Medication list given to you today.  *If you need a refill on your cardiac medications before your next appointment, please call your pharmacy*   Lab Work: Lipid Panel, Direct LDL today   If you have labs (blood work) drawn today and your tests are completely normal, you will receive your results only by: Marland Kitchen MyChart Message (if you have MyChart) OR . A paper copy in the mail If you have any lab test that is abnormal or we need to change your treatment, we will call you to review the results.   Testing/Procedures: NONE   Follow-Up: At [facility], you and your health needs are our priority.  As part of our continuing mission to provide you with exceptional heart care, we have created designated Provider Care Teams.  These Care Teams include your primary Cardiologist (physician) and Advanced Practice Providers (APPs -  Physician Assistants and Nurse Practitioners) who all work together to provide you with the care you need, when you need it.  We recommend signing up for the patient portal called "MyChart".  Sign up information is provided on this After Visit Summary.  MyChart is used to connect with patients for Virtual Visits (Telemedicine).  Patients are able to view lab/test results, encounter notes, upcoming appointments, etc.  Non-urgent messages can be sent to your provider as well.   To learn more about what you can do with MyChart, go to [email or web address].    Your next appointment:   12 month(s) - lipid clinic  The format for your next appointment:   In Person  Provider:   K. Mali Hilty, MD   Other Instructions

## 2020-05-13 ENCOUNTER — Encounter: Payer: Self-pay | Admitting: Internal Medicine

## 2020-05-13 DIAGNOSIS — N186 End stage renal disease: Secondary | ICD-10-CM | POA: Diagnosis not present

## 2020-05-13 DIAGNOSIS — N2581 Secondary hyperparathyroidism of renal origin: Secondary | ICD-10-CM | POA: Diagnosis not present

## 2020-05-13 DIAGNOSIS — Z992 Dependence on renal dialysis: Secondary | ICD-10-CM | POA: Diagnosis not present

## 2020-05-13 DIAGNOSIS — D509 Iron deficiency anemia, unspecified: Secondary | ICD-10-CM | POA: Diagnosis not present

## 2020-05-13 LAB — LIPID PANEL
Chol/HDL Ratio: 14 ratio — ABNORMAL HIGH (ref 0.0–5.0)
Cholesterol, Total: 365 mg/dL — ABNORMAL HIGH (ref 100–199)
HDL: 26 mg/dL — ABNORMAL LOW (ref >39)
Triglycerides: 1052 mg/dL (ref 0–149)

## 2020-05-13 LAB — LDL CHOLESTEROL, DIRECT: LDL Direct: 75 mg/dL (ref 0–99)

## 2020-05-14 DIAGNOSIS — I129 Hypertensive chronic kidney disease with stage 1 through stage 4 chronic kidney disease, or unspecified chronic kidney disease: Secondary | ICD-10-CM | POA: Diagnosis not present

## 2020-05-14 DIAGNOSIS — Z992 Dependence on renal dialysis: Secondary | ICD-10-CM | POA: Diagnosis not present

## 2020-05-14 DIAGNOSIS — N186 End stage renal disease: Secondary | ICD-10-CM | POA: Diagnosis not present

## 2020-05-15 DIAGNOSIS — Z992 Dependence on renal dialysis: Secondary | ICD-10-CM | POA: Diagnosis not present

## 2020-05-15 DIAGNOSIS — N2581 Secondary hyperparathyroidism of renal origin: Secondary | ICD-10-CM | POA: Diagnosis not present

## 2020-05-15 DIAGNOSIS — N186 End stage renal disease: Secondary | ICD-10-CM | POA: Diagnosis not present

## 2020-05-18 DIAGNOSIS — N2581 Secondary hyperparathyroidism of renal origin: Secondary | ICD-10-CM | POA: Diagnosis not present

## 2020-05-18 DIAGNOSIS — D509 Iron deficiency anemia, unspecified: Secondary | ICD-10-CM | POA: Diagnosis not present

## 2020-05-18 DIAGNOSIS — Z992 Dependence on renal dialysis: Secondary | ICD-10-CM | POA: Diagnosis not present

## 2020-05-18 DIAGNOSIS — N186 End stage renal disease: Secondary | ICD-10-CM | POA: Diagnosis not present

## 2020-05-20 DIAGNOSIS — D509 Iron deficiency anemia, unspecified: Secondary | ICD-10-CM | POA: Diagnosis not present

## 2020-05-20 DIAGNOSIS — N186 End stage renal disease: Secondary | ICD-10-CM | POA: Diagnosis not present

## 2020-05-20 DIAGNOSIS — N2581 Secondary hyperparathyroidism of renal origin: Secondary | ICD-10-CM | POA: Diagnosis not present

## 2020-05-20 DIAGNOSIS — Z992 Dependence on renal dialysis: Secondary | ICD-10-CM | POA: Diagnosis not present

## 2020-05-22 DIAGNOSIS — N2581 Secondary hyperparathyroidism of renal origin: Secondary | ICD-10-CM | POA: Diagnosis not present

## 2020-05-22 DIAGNOSIS — D509 Iron deficiency anemia, unspecified: Secondary | ICD-10-CM | POA: Diagnosis not present

## 2020-05-22 DIAGNOSIS — Z992 Dependence on renal dialysis: Secondary | ICD-10-CM | POA: Diagnosis not present

## 2020-05-22 DIAGNOSIS — N186 End stage renal disease: Secondary | ICD-10-CM | POA: Diagnosis not present

## 2020-05-25 DIAGNOSIS — N186 End stage renal disease: Secondary | ICD-10-CM | POA: Diagnosis not present

## 2020-05-25 DIAGNOSIS — D509 Iron deficiency anemia, unspecified: Secondary | ICD-10-CM | POA: Diagnosis not present

## 2020-05-25 DIAGNOSIS — Z992 Dependence on renal dialysis: Secondary | ICD-10-CM | POA: Diagnosis not present

## 2020-05-25 DIAGNOSIS — N2581 Secondary hyperparathyroidism of renal origin: Secondary | ICD-10-CM | POA: Diagnosis not present
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
## 2020-05-26 ENCOUNTER — Ambulatory Visit: Payer: Medicare Other | Admitting: Neurology

## 2020-05-26 ENCOUNTER — Encounter: Payer: Self-pay | Admitting: Neurology

## 2020-05-26 ENCOUNTER — Other Ambulatory Visit: Payer: Self-pay

## 2020-05-26 VITALS — BP 176/79 | HR 88 | Resp 20 | Ht 69.0 in | Wt 245.0 lb

## 2020-05-26 DIAGNOSIS — N186 End stage renal disease: Secondary | ICD-10-CM | POA: Diagnosis not present

## 2020-05-26 DIAGNOSIS — Z992 Dependence on renal dialysis: Secondary | ICD-10-CM

## 2020-05-26 DIAGNOSIS — G4489 Other headache syndrome: Secondary | ICD-10-CM

## 2020-05-26 DIAGNOSIS — I10 Essential (primary) hypertension: Secondary | ICD-10-CM

## 2020-05-26 NOTE — Patient Instructions (Signed)
1.  I will give you samples of Nurtec.  Take 1 tablet just before starting dialysis.  No more than 1 tablet in 24 hours.  It is a dissolvable tablet, so place under your tongue.  If effective, contact me for prescription.  Also , ask the people at the dialysis if there is anything that they can give you for headache prior to starting dialysis. 2.  Follow up in 6 months.

## 2020-05-27 DIAGNOSIS — D509 Iron deficiency anemia, unspecified: Secondary | ICD-10-CM | POA: Diagnosis not present

## 2020-05-27 DIAGNOSIS — Z992 Dependence on renal dialysis: Secondary | ICD-10-CM | POA: Diagnosis not present

## 2020-05-27 DIAGNOSIS — N186 End stage renal disease: Secondary | ICD-10-CM | POA: Diagnosis not present

## 2020-05-27 DIAGNOSIS — N2581 Secondary hyperparathyroidism of renal origin: Secondary | ICD-10-CM | POA: Diagnosis not present

## 2020-05-29 DIAGNOSIS — N186 End stage renal disease: Secondary | ICD-10-CM | POA: Diagnosis not present

## 2020-05-29 DIAGNOSIS — D509 Iron deficiency anemia, unspecified: Secondary | ICD-10-CM | POA: Diagnosis not present

## 2020-05-29 DIAGNOSIS — Z992 Dependence on renal dialysis: Secondary | ICD-10-CM | POA: Diagnosis not present

## 2020-05-29 DIAGNOSIS — N2581 Secondary hyperparathyroidism of renal origin: Secondary | ICD-10-CM | POA: Diagnosis not present

## 2020-06-01 DIAGNOSIS — Z992 Dependence on renal dialysis: Secondary | ICD-10-CM | POA: Diagnosis not present

## 2020-06-01 DIAGNOSIS — N2581 Secondary hyperparathyroidism of renal origin: Secondary | ICD-10-CM | POA: Diagnosis not present

## 2020-06-01 DIAGNOSIS — D509 Iron deficiency anemia, unspecified: Secondary | ICD-10-CM | POA: Diagnosis not present

## 2020-06-01 DIAGNOSIS — N186 End stage renal disease: Secondary | ICD-10-CM | POA: Diagnosis not present

## 2020-06-03 DIAGNOSIS — D509 Iron deficiency anemia, unspecified: Secondary | ICD-10-CM | POA: Diagnosis not present

## 2020-06-03 DIAGNOSIS — N2581 Secondary hyperparathyroidism of renal origin: Secondary | ICD-10-CM | POA: Diagnosis not present

## 2020-06-03 DIAGNOSIS — N186 End stage renal disease: Secondary | ICD-10-CM | POA: Diagnosis not present

## 2020-06-03 DIAGNOSIS — Z992 Dependence on renal dialysis: Secondary | ICD-10-CM | POA: Diagnosis not present

## 2020-06-04 ENCOUNTER — Telehealth: Payer: Self-pay | Admitting: Internal Medicine

## 2020-06-04 ENCOUNTER — Telehealth: Payer: Self-pay

## 2020-06-04 MED ORDER — ICOSAPENT ETHYL 1 G PO CAPS: 2.0000 g | ORAL_CAPSULE | Freq: Two times a day (BID) | ORAL | 11 refills | Status: DC

## 2020-06-04 NOTE — Telephone Encounter (Signed)
This not our patient  

## 2020-06-04 NOTE — Telephone Encounter (Signed)
*  STAT* If patient is at the pharmacy, call can be transferred to refill team.   1. Which medications need to be refilled? (please list name of each medication and dose if known) icosapent Ethyl (VASCEPA) 1 g capsule  2. Which pharmacy/location (including street and city if local pharmacy) is medication to be sent to? [facility] [address] [place], [place] phone# [phone] fax#  3. Do they need a 30 day or 90 day supply? 30 day supply

## 2020-06-04 NOTE — Telephone Encounter (Signed)
Med refill nurtec ODT  Per pt is a new medicine

## 2020-06-05 ENCOUNTER — Other Ambulatory Visit: Payer: Self-pay | Admitting: Neurology

## 2020-06-05 DIAGNOSIS — D509 Iron deficiency anemia, unspecified: Secondary | ICD-10-CM | POA: Diagnosis not present

## 2020-06-05 DIAGNOSIS — Z992 Dependence on renal dialysis: Secondary | ICD-10-CM | POA: Diagnosis not present

## 2020-06-05 DIAGNOSIS — N2581 Secondary hyperparathyroidism of renal origin: Secondary | ICD-10-CM | POA: Diagnosis not present

## 2020-06-05 DIAGNOSIS — N186 End stage renal disease: Secondary | ICD-10-CM | POA: Diagnosis not present

## 2020-06-05 MED ORDER — NURTEC 75 MG PO TBDP: 75.0000 mg | ORAL_TABLET | ORAL | 5 refills | Status: DC | PRN

## 2020-06-05 NOTE — Telephone Encounter (Signed)
Patient called in stating he got samples on Nurtec at his last visit and they have been working. He would like to get a prescription sent in for Nurtec to [facility] on [address]

## 2020-06-08 DIAGNOSIS — N2581 Secondary hyperparathyroidism of renal origin: Secondary | ICD-10-CM | POA: Diagnosis not present

## 2020-06-08 DIAGNOSIS — Z992 Dependence on renal dialysis: Secondary | ICD-10-CM | POA: Diagnosis not present

## 2020-06-08 DIAGNOSIS — D509 Iron deficiency anemia, unspecified: Secondary | ICD-10-CM | POA: Diagnosis not present

## 2020-06-08 DIAGNOSIS — N186 End stage renal disease: Secondary | ICD-10-CM | POA: Diagnosis not present

## 2020-06-10 DIAGNOSIS — N2581 Secondary hyperparathyroidism of renal origin: Secondary | ICD-10-CM | POA: Diagnosis not present

## 2020-06-10 DIAGNOSIS — N186 End stage renal disease: Secondary | ICD-10-CM | POA: Diagnosis not present

## 2020-06-10 DIAGNOSIS — D509 Iron deficiency anemia, unspecified: Secondary | ICD-10-CM | POA: Diagnosis not present

## 2020-06-10 DIAGNOSIS — Z992 Dependence on renal dialysis: Secondary | ICD-10-CM | POA: Diagnosis not present

## 2020-06-12 DIAGNOSIS — Z992 Dependence on renal dialysis: Secondary | ICD-10-CM | POA: Diagnosis not present

## 2020-06-12 DIAGNOSIS — N2581 Secondary hyperparathyroidism of renal origin: Secondary | ICD-10-CM | POA: Diagnosis not present

## 2020-06-12 DIAGNOSIS — N186 End stage renal disease: Secondary | ICD-10-CM | POA: Diagnosis not present

## 2020-06-12 DIAGNOSIS — D509 Iron deficiency anemia, unspecified: Secondary | ICD-10-CM | POA: Diagnosis not present

## 2020-06-13 DIAGNOSIS — Z992 Dependence on renal dialysis: Secondary | ICD-10-CM | POA: Diagnosis not present

## 2020-06-13 DIAGNOSIS — I129 Hypertensive chronic kidney disease with stage 1 through stage 4 chronic kidney disease, or unspecified chronic kidney disease: Secondary | ICD-10-CM | POA: Diagnosis not present

## 2020-06-13 DIAGNOSIS — N186 End stage renal disease: Secondary | ICD-10-CM | POA: Diagnosis not present

## 2020-06-17 DIAGNOSIS — D509 Iron deficiency anemia, unspecified: Secondary | ICD-10-CM | POA: Diagnosis not present

## 2020-06-17 DIAGNOSIS — Z992 Dependence on renal dialysis: Secondary | ICD-10-CM | POA: Diagnosis not present

## 2020-06-17 DIAGNOSIS — N186 End stage renal disease: Secondary | ICD-10-CM | POA: Diagnosis not present

## 2020-06-17 DIAGNOSIS — N2581 Secondary hyperparathyroidism of renal origin: Secondary | ICD-10-CM | POA: Diagnosis not present

## 2020-06-18 ENCOUNTER — Other Ambulatory Visit: Payer: Self-pay

## 2020-06-18 ENCOUNTER — Telehealth: Payer: Self-pay | Admitting: Neurology

## 2020-06-18 MED ORDER — NURTEC 75 MG PO TBDP: 75.0000 mg | ORAL_TABLET | ORAL | 5 refills | Status: DC | PRN

## 2020-06-18 NOTE — Telephone Encounter (Signed)
New message   1. Which medications need to be refilled? (please list name of each medication and dose if known) Nurtec - patient was given sample of medication asking for refill   2. Which pharmacy/location (including street and city if local pharmacy) is medication to be sent to?  [facility] [phone] - [place], [place] AT [address]  3. Do they need a 30 day or 90 day supply? 30 day supply

## 2020-06-19 DIAGNOSIS — N2581 Secondary hyperparathyroidism of renal origin: Secondary | ICD-10-CM | POA: Diagnosis not present

## 2020-06-19 DIAGNOSIS — D509 Iron deficiency anemia, unspecified: Secondary | ICD-10-CM | POA: Diagnosis not present

## 2020-06-19 DIAGNOSIS — N186 End stage renal disease: Secondary | ICD-10-CM | POA: Diagnosis not present

## 2020-06-19 DIAGNOSIS — Z992 Dependence on renal dialysis: Secondary | ICD-10-CM | POA: Diagnosis not present

## 2020-06-19 NOTE — Progress Notes (Addendum)
Johnnette Litter (Key: [id number]) Nurtec 75MG  dispersible tablets   Form Wait for Determination Please wait for [facility] to return a determination.[facility] prior authorization Pt [id number]. 06/19/2020 Sent to plan for review and autorization    Nurtec 75mg  denied. Routing to [facility] for recommendations.

## 2020-06-22 DIAGNOSIS — N186 End stage renal disease: Secondary | ICD-10-CM | POA: Diagnosis not present

## 2020-06-22 DIAGNOSIS — Z992 Dependence on renal dialysis: Secondary | ICD-10-CM | POA: Diagnosis not present

## 2020-06-22 DIAGNOSIS — Z23 Encounter for immunization: Secondary | ICD-10-CM | POA: Diagnosis not present

## 2020-06-22 DIAGNOSIS — D509 Iron deficiency anemia, unspecified: Secondary | ICD-10-CM | POA: Diagnosis not present

## 2020-06-22 DIAGNOSIS — D631 Anemia in chronic kidney disease: Secondary | ICD-10-CM | POA: Diagnosis not present

## 2020-06-22 DIAGNOSIS — N2581 Secondary hyperparathyroidism of renal origin: Secondary | ICD-10-CM | POA: Diagnosis not present

## 2020-06-22 NOTE — Progress Notes (Signed)
The number above is not working. Trying to contact patient to ask about copayment card. I have her one here at the office for him. Will try again on 06/19/2020

## 2020-06-23 NOTE — Progress Notes (Signed)
Unable to reach by phone

## 2020-06-23 NOTE — Progress Notes (Signed)
Sent letter to patient that rx was deneid for the nurtec 75mG ,advised to contact office for a coupon card.

## 2020-06-24 DIAGNOSIS — D631 Anemia in chronic kidney disease: Secondary | ICD-10-CM | POA: Diagnosis not present

## 2020-06-24 DIAGNOSIS — D509 Iron deficiency anemia, unspecified: Secondary | ICD-10-CM | POA: Diagnosis not present

## 2020-06-24 DIAGNOSIS — Z23 Encounter for immunization: Secondary | ICD-10-CM | POA: Diagnosis not present

## 2020-06-24 DIAGNOSIS — Z992 Dependence on renal dialysis: Secondary | ICD-10-CM | POA: Diagnosis not present

## 2020-06-24 DIAGNOSIS — N186 End stage renal disease: Secondary | ICD-10-CM | POA: Diagnosis not present

## 2020-06-24 DIAGNOSIS — N2581 Secondary hyperparathyroidism of renal origin: Secondary | ICD-10-CM | POA: Diagnosis not present

## 2020-06-25 NOTE — Progress Notes (Signed)
Your PA has been faxed to the plan as a paper copy. Please contact the plan directly if you haven't received a determination in a typical timeframe.

## 2020-06-26 DIAGNOSIS — D631 Anemia in chronic kidney disease: Secondary | ICD-10-CM | POA: Diagnosis not present

## 2020-06-26 DIAGNOSIS — N2581 Secondary hyperparathyroidism of renal origin: Secondary | ICD-10-CM | POA: Diagnosis not present

## 2020-06-26 DIAGNOSIS — Z23 Encounter for immunization: Secondary | ICD-10-CM | POA: Diagnosis not present

## 2020-06-26 DIAGNOSIS — D509 Iron deficiency anemia, unspecified: Secondary | ICD-10-CM | POA: Diagnosis not present

## 2020-06-26 DIAGNOSIS — Z992 Dependence on renal dialysis: Secondary | ICD-10-CM | POA: Diagnosis not present

## 2020-06-26 DIAGNOSIS — N186 End stage renal disease: Secondary | ICD-10-CM | POA: Diagnosis not present

## 2020-06-29 DIAGNOSIS — N186 End stage renal disease: Secondary | ICD-10-CM | POA: Diagnosis not present

## 2020-06-29 DIAGNOSIS — Z992 Dependence on renal dialysis: Secondary | ICD-10-CM | POA: Diagnosis not present

## 2020-06-29 DIAGNOSIS — D509 Iron deficiency anemia, unspecified: Secondary | ICD-10-CM | POA: Diagnosis not present

## 2020-06-29 DIAGNOSIS — N2581 Secondary hyperparathyroidism of renal origin: Secondary | ICD-10-CM | POA: Diagnosis not present

## 2020-07-01 ENCOUNTER — Ambulatory Visit: Payer: Medicare Other | Admitting: Emergency Medicine

## 2020-07-01 ENCOUNTER — Ambulatory Visit: Payer: Self-pay | Admitting: Emergency Medicine

## 2020-07-01 DIAGNOSIS — D509 Iron deficiency anemia, unspecified: Secondary | ICD-10-CM | POA: Diagnosis not present

## 2020-07-01 DIAGNOSIS — Z992 Dependence on renal dialysis: Secondary | ICD-10-CM | POA: Diagnosis not present

## 2020-07-01 DIAGNOSIS — N186 End stage renal disease: Secondary | ICD-10-CM | POA: Diagnosis not present

## 2020-07-01 DIAGNOSIS — N2581 Secondary hyperparathyroidism of renal origin: Secondary | ICD-10-CM | POA: Diagnosis not present

## 2020-07-03 DIAGNOSIS — Z992 Dependence on renal dialysis: Secondary | ICD-10-CM | POA: Diagnosis not present

## 2020-07-03 DIAGNOSIS — N186 End stage renal disease: Secondary | ICD-10-CM | POA: Diagnosis not present

## 2020-07-03 DIAGNOSIS — N2581 Secondary hyperparathyroidism of renal origin: Secondary | ICD-10-CM | POA: Diagnosis not present

## 2020-07-03 DIAGNOSIS — D509 Iron deficiency anemia, unspecified: Secondary | ICD-10-CM | POA: Diagnosis not present

## 2020-07-06 DIAGNOSIS — D509 Iron deficiency anemia, unspecified: Secondary | ICD-10-CM | POA: Diagnosis not present

## 2020-07-06 DIAGNOSIS — Z992 Dependence on renal dialysis: Secondary | ICD-10-CM | POA: Diagnosis not present

## 2020-07-06 DIAGNOSIS — N186 End stage renal disease: Secondary | ICD-10-CM | POA: Diagnosis not present

## 2020-07-06 DIAGNOSIS — N2581 Secondary hyperparathyroidism of renal origin: Secondary | ICD-10-CM | POA: Diagnosis not present

## 2020-07-07 ENCOUNTER — Other Ambulatory Visit: Payer: Self-pay

## 2020-07-07 ENCOUNTER — Encounter: Payer: Self-pay | Admitting: Emergency Medicine

## 2020-07-07 ENCOUNTER — Ambulatory Visit (INDEPENDENT_AMBULATORY_CARE_PROVIDER_SITE_OTHER): Payer: Medicare Other | Admitting: Emergency Medicine

## 2020-07-07 VITALS — BP 120/68 | HR 92 | Temp 98.2°F | Ht 69.0 in | Wt 247.0 lb

## 2020-07-07 DIAGNOSIS — Z992 Dependence on renal dialysis: Secondary | ICD-10-CM

## 2020-07-07 DIAGNOSIS — G4489 Other headache syndrome: Secondary | ICD-10-CM | POA: Diagnosis not present

## 2020-07-07 DIAGNOSIS — N186 End stage renal disease: Secondary | ICD-10-CM | POA: Diagnosis not present

## 2020-07-07 DIAGNOSIS — I110 Hypertensive heart disease with heart failure: Secondary | ICD-10-CM | POA: Diagnosis not present

## 2020-07-07 DIAGNOSIS — E782 Mixed hyperlipidemia: Secondary | ICD-10-CM | POA: Diagnosis not present

## 2020-07-07 DIAGNOSIS — I5022 Chronic systolic (congestive) heart failure: Secondary | ICD-10-CM

## 2020-07-07 DIAGNOSIS — E785 Hyperlipidemia, unspecified: Secondary | ICD-10-CM

## 2020-07-07 MED ORDER — IBUPROFEN & ACETAMINOPHEN 600 & 500 MG PO TBPK: 1.0000 | ORAL_TABLET | Freq: Three times a day (TID) | ORAL | 1 refills | Status: DC | PRN

## 2020-07-07 NOTE — Assessment & Plan Note (Signed)
Cardiology/lipid clinic office visit notes reviewed.  Continue Vascepa. Diet and nutrition discussed.

## 2020-07-07 NOTE — Patient Instructions (Signed)
General Headache Without Cause A headache is pain or discomfort that is felt around the head or neck area. There are many causes and types of headaches. In some cases, the cause may not be found. Follow these instructions at home: Watch your condition for any changes. Let your doctor know about them. Take these steps to help with your condition: Managing pain  Take over-the-counter and prescription medicines only as told by your doctor.  Lie down in a dark, quiet room when you have a headache.  If told, put ice on your head and neck area: ? Put ice in a plastic bag. ? Place a towel between your skin and the bag. ? Leave the ice on for 20 minutes, 2-3 times per day.  If told, put heat on the affected area. Use the heat source that your doctor recommends, such as a moist heat pack or a heating pad. ? Place a towel between your skin and the heat source. ? Leave the heat on for 20-30 minutes. ? Remove the heat if your skin turns bright red. This is very important if you are unable to feel pain, heat, or cold. You may have a greater risk of getting burned.  Keep lights dim if bright lights bother you or make your headaches worse.      Eating and drinking  Eat meals on a regular schedule.  If you drink alcohol: ? Limit how much you use to:  0-1 drink a day for women.  0-2 drinks a day for men. ? Be aware of how much alcohol is in your drink. In the [place] one drink equals one 12 oz bottle of beer (355 mL), one 5 oz glass of wine (148 mL), or one 1 oz glass of hard liquor (44 mL).  Stop drinking caffeine, or reduce how much caffeine you drink. General instructions  Keep a journal to find out if certain things bring on headaches. For example, write down: ? What you eat and drink. ? How much sleep you get. ? Any change to your diet or medicines.  Get a massage or try other ways to relax.  Limit stress.  Sit up straight. Do not tighten (tense) your muscles.  Do not use any  products that contain nicotine or tobacco. This includes cigarettes, e-cigarettes, and chewing tobacco. If you need help quitting, ask your doctor.  Exercise regularly as told by your doctor.  Get enough sleep. This often means 7-9 hours of sleep each night.  Keep all follow-up visits as told by your doctor. This is important.   Contact a doctor if:  Your symptoms are not helped by medicine.  You have a headache that feels different than the other headaches.  You feel sick to your stomach (nauseous) or you throw up (vomit).  You have a fever. Get help right away if:  Your headache gets very bad quickly.  Your headache gets worse after a lot of physical activity.  You keep throwing up.  You have a stiff neck.  You have trouble seeing.  You have trouble speaking.  You have pain in the eye or ear.  Your muscles are weak or you lose muscle control.  You lose your balance or have trouble walking.  You feel like you will pass out (faint) or you pass out.  You are mixed up (confused).  You have a seizure. Summary  A headache is pain or discomfort that is felt around the head or neck area.  There are many   causes and types of headaches. In some cases, the cause may not be found.  Keep a journal to help find out what causes your headaches. Watch your condition for any changes. Let your doctor know about them.  Contact a doctor if you have a headache that is different from usual, or if your headache is not helped by medicine.  Get help right away if your headache gets very bad, you throw up, you have trouble seeing, you lose your balance, or you have a seizure. This information is not intended to replace advice given to you by your health care provider. Make sure you discuss any questions you have with your health care provider. Document Revised: 08/21/2017 Document Reviewed: 08/21/2017 Elsevier Patient Education  2021 Elsevier Inc.  

## 2020-07-07 NOTE — Assessment & Plan Note (Signed)
Dialysis induced headache.  Was advised by neurologist to try Nurtec prior to dialysis but not sure if patient understood instructions or if he took the medication.  Recommend to try acetaminophen/ibuprofen combination prior to dialysis.

## 2020-07-07 NOTE — Progress Notes (Signed)
[name] 55 y.o.   Chief Complaint  Patient presents with  . Headache    Pt states after his dialysis treatment he gets a headache for several hours and this happens often. Pt would like to discuss medication to help.     HISTORY OF PRESENT ILLNESS: This is a 55 y.o. male dialysis patient complaining of generalized headache that starts about 2 hours into dialysis session. No other associated symptoms. Recent neurology evaluation for the same as follows: [name] [id number]   Assessment/Plan:    1.  Hemodialysis-related headache 2.  ESRD on HD 3.  HTN   1.  I will provide him samples of Nurtec to take right before starting HD.  I have also recommended that he discuss with his dialysis physician whether they can change something in the solvent or rate of his hemodialysis and to make sure that his blood pressure remains as stable as possible. 2.  Follow up with PCP or nephrology regarding blood pressure 3.  Follow up 4 to 6 months.  Patient also has history of dyslipidemia with very high triglycerides.  Most recent cardiology/lipid clinic office visit as follows: ASSESSMENT: 1. Secondary hypertriglyceridemia secondary to advanced kidney disease 2. Progressive dyspnea on exertion, ? coronary equivalent 3. ESRD-now on dialysis   PLAN: 1.   [name] is due for repeat lipids.  He is on Vascepa.  His triglycerides have been high.  He has not pursuing renal transplant at this point.  He has had some issues with headaches.  His nephrologist had ordered a CT scan of the head and neck which was negative.  He was not seen with a translator today and I feel that there is somewhat of a language barrier between [place].  We will go ahead and refill his Vascepa.  We will continue with that for the time being.  Follow-up annually or sooner as necessary for lipids only.   Pixie Casino, MD, [facility], [place] Director of the Advanced Lipid Disorders &   Cardiovascular Risk Reduction Clinic Diplomate of the American Board of Clinical Lipidology Attending Cardiologist  Direct Dial: [phone]  Fax: [phone]  Website:  [email or web address] 05/12/2020, 2:49 PM 05/12/2020   [name] returns for follow-up.  He has not had recent lipid testing.  He reports compliance with his Vascepa.  He says he is having some difficulty getting it.  Triglycerides have been significantly elevated.  Because he is end-stage renal disease he is not on a statin.  He is followed by Dr. Carolin Sicks.  He recently been having some issues with headache after dialysis.  A CT scan was ordered of the head and neck and performed in early March and was negative.  I provided those results to them today.   HPI   Prior to Admission medications   Medication Sig Start Date End Date Taking? Authorizing Provider  acetaminophen (TYLENOL) 325 MG tablet Take by mouth.   Yes [provider]  amLODipine (NORVASC) 5 MG tablet Take 1 tablet (5 mg total) by mouth daily. 04/18/19 05/18/19 Yes Alma Friendly, MD  Betamethasone Sodium Phosphate 6 MG/ML SOLN INJECT 2:4 [place] JOINT 06/21/19  Yes [provider]  calcium acetate (PHOSLO) 667 MG capsule Take 2 capsules (1,334 mg total) by mouth 3 (three) times daily with meals. 12/30/19  Yes Eular Panek, Ines Bloomer, MD  carvedilol (COREG) 25 MG tablet Take 1 tablet (25 mg  total) by mouth 2 (two) times daily with a meal. 04/17/19 05/17/19 Yes Alma Friendly, MD  hydrALAZINE (APRESOLINE) 25 MG tablet Take 1 tablet (25 mg total) by mouth every 8 (eight) hours. 04/17/19 05/17/19 Yes Alma Friendly, MD  icosapent Ethyl (VASCEPA) 1 g capsule Take 2 capsules (2 g total) by mouth 2 (two) times daily. 06/04/20  Yes Hilty, Nadean Corwin, MD  pantoprazole (PROTONIX) 40 MG tablet Take 1 tablet (40 mg total) by mouth daily. 04/17/19  Yes Alma Friendly, MD  VITAMIN D PO Take by mouth. 07/09/19  07/07/20 Yes [provider]  benzoyl peroxide (BENZOYL PEROXIDE) 5 % external liquid Apply topically 2 (two) times daily. Patient not taking: Reported on 07/07/2020 12/30/19   Horald Pollen, MD  iron sucrose in sodium chloride 0.9 % 100 mL Iron Sucrose (Venofer) Patient not taking: Reported on 07/07/2020 08/08/19 08/06/20  [provider]  lidocaine-prilocaine (EMLA) cream Apply topically. Patient not taking: Reported on 07/07/2020 06/11/19   [provider]  Methoxy PEG-Epoetin Beta (MIRCERA IJ) Mircera Patient not taking: Reported on 07/07/2020 08/01/19 07/30/20  [provider]  Rimegepant Sulfate (NURTEC) 75 MG TBDP Take 75 mg by mouth as needed. Maxium 1 tab in 24 hours Patient not taking: Reported on 07/07/2020 06/18/20   Pieter Partridge, DO    Allergies  Allergen Reactions  . Lokelma [Sodium Zirconium Cyclosilicate] Shortness Of Breath  . Beef-Derived Products Other (See Comments)    Cultural preference  . Pegademase Bovine Other (See Comments)    Cultural preference  . Poractant Alfa Other (See Comments)    Cultural preference  . Pork-Derived Products Other (See Comments)    Cultural preference    Patient Active Problem List   Diagnosis Date Noted  . Hypertensive heart disease with chronic systolic congestive heart failure ([place]) 07/01/2019  . DCM (dilated cardiomyopathy) ([place])   . CKD (chronic kidney disease) stage 5, GFR less than 15 ml/min (HCC) 04/11/2019  . History of colon polyps 03/20/2019  . Gastroesophageal reflux disease 03/20/2019  . CKD (chronic kidney disease) stage 4, GFR 15-29 ml/min (HCC) 08/25/2018  . CKD (chronic kidney disease) stage 3, GFR 30-59 ml/min (HCC) 05/10/2017  . Renal insufficiency 11/15/2016  . Testicular hypofunction 09/15/2015  . BPH without obstruction/lower urinary tract symptoms 09/15/2015  . Bilateral hearing loss 07/03/2015  . Mixed hearing loss of right ear 07/03/2015  . Hyperlipidemia 03/29/2013  .  Nephrolithiasis 03/14/2013  . Lumbar radiculopathy 02/26/2013  . Vitamin D deficiency 08/03/2011  . HLD (hyperlipidemia) 04/10/2010    Past Medical History:  Diagnosis Date  . Anemia   . Back pain   . CKD (chronic kidney disease), stage III (HCC)    Stage 4  . Colon polyps    adenomatous  . Elevated cholesterol   . History of echocardiogram    Echo 11/17: EF 65-70, normal wall motion, grade 1 diastolic dysfunction, trivial MR, mild LAE, mild TR  . Hyperlipidemia   . Hypertension    no current bp meds for last 3 months  . Kidney stones 2007  . Medication intolerance    a. multiple with prior nonadherence to regimen.  . Nausea    " in the morning since having the kidney problem "  . Otosclerosis of both ears   . Pneumonia   . PONV (postoperative nausea and vomiting)   . PUD (peptic ulcer disease)    Has had unspecified surgery for this  . Sleep apnea  Past Surgical History:  Procedure Laterality Date  . AV FISTULA PLACEMENT Left 01/04/2019   Procedure: ARTERIOVENOUS (AV) FISTULA CREATION LEFT ARM;  Surgeon: [name], MD;  Location: [facility];  Service: Vascular;  Laterality: Left;  . BASCILIC VEIN TRANSPOSITION Left 03/11/2019   Procedure: BASILIC VEIN TRANSPOSITION 2ND STAGE LEFT;  Surgeon: [name], MD;  Location: [facility];  Service: Vascular;  Laterality: Left;  . NEPHRECTOMY  02/18/2011   Procedure: NEPHRECTOMY;  Surgeon: Hanley Ben, MD;  Location: [facility];  Service: Urology;  Laterality: Right;  . SMALL INTESTINE SURGERY    . STAPEDOTOMY  2005   lt ear jan, right ear sept  . surgery for ulcers  1990    Social History   Socioeconomic History  . Marital status: Married    Spouse name: Edgardo Roys  . Number of children: 5  . Years of education: Not on file  . Highest education level: 12th grade  Occupational History    Employer: [employer]  Tobacco Use  . Smoking status: Never Smoker  . Smokeless tobacco: Never Used  Vaping Use  .  Vaping Use: Never used  Substance and Sexual Activity  . Alcohol use: No  . Drug use: No  . Sexual activity: Yes    Birth control/protection: None  Other Topics Concern  . Not on file  Social History Narrative   Lives at home with wife and family. He is from [place]. Came to the [place] in 2002.      Patient is right-handed. He lives in a one level home. He drinks 1-2 cups of coffee and tea a day. He does not exercise.   Social Determinants of Health   Financial Resource Strain: Not on file  Food Insecurity: Not on file  Transportation Needs: Not on file  Physical Activity: Not on file  Stress: Not on file  Social Connections: Not on file  Intimate Partner Violence: Not on file    Family History  Problem Relation Age of Onset  . Hyperlipidemia Father   . Heart disease Mother   . Hypertension Mother   . Hypertension Sister   . Heart disease Brother 39       CABG  . Hyperlipidemia Sister   . Hyperlipidemia Sister   . Esophageal cancer Cousin   . Healthy Child   . Colon cancer Neg Hx   . Stomach cancer Neg Hx   . Rectal cancer Neg Hx      Review of Systems  Constitutional: Negative.  Negative for chills and fever.  HENT: Negative.  Negative for congestion and sore throat.   Respiratory: Negative.  Negative for cough and shortness of breath.   Cardiovascular: Negative.  Negative for chest pain and palpitations.  Gastrointestinal: Negative for abdominal pain, diarrhea, nausea and vomiting.  Genitourinary: Negative.  Negative for dysuria and hematuria.  Musculoskeletal: Negative.  Negative for myalgias and neck pain.  Skin: Negative.  Negative for rash.  Neurological: Negative for dizziness and headaches.  All other systems reviewed and are negative.   Today's Vitals   07/07/20 1325  BP: 120/68  Pulse: 92  Temp: 98.2 F (36.8 C)  TempSrc: Oral  SpO2: 96%  Weight: 247 lb (112 kg)  Height: 5\' 9"  (1.753 m)   Body mass index is 36.48 kg/m.  Physical Exam Vitals  reviewed.  Constitutional:      Appearance: Normal appearance. He is well-developed.  HENT:     Head: Normocephalic.  Eyes:  Extraocular Movements: Extraocular movements intact.     Pupils: Pupils are equal, round, and reactive to light.  Cardiovascular:     Rate and Rhythm: Normal rate and regular rhythm.     Pulses: Normal pulses.     Heart sounds: Normal heart sounds.  Pulmonary:     Effort: Pulmonary effort is normal.     Breath sounds: Normal breath sounds.  Musculoskeletal:        General: Normal range of motion.     Cervical back: Normal range of motion.  Skin:    General: Skin is warm and dry.     Capillary Refill: Capillary refill takes less than 2 seconds.     Comments: Functioning AV dialysis fistula on left upper arm  Neurological:     General: No focal deficit present.     Mental Status: He is alert and oriented to person, place, and time.  Psychiatric:        Mood and Affect: Mood normal.        Behavior: Behavior normal.      ASSESSMENT & PLAN: A total of 30 minutes was spent with the patient and counseling/coordination of care regarding multiple chronic medical problems, dialysis induced headaches and approach to treatment, review of all medications, review of most recent office visits with neurologist and cardiologist, review of most recent blood work results, education on nutrition, prognosis, documentation and need for follow-up.   Other headache syndrome Dialysis induced headache.  Was advised by neurologist to try Nurtec prior to dialysis but not sure if patient understood instructions or if he took the medication.  Recommend to try acetaminophen/ibuprofen combination prior to dialysis.  HLD (hyperlipidemia) Cardiology/lipid clinic office visit notes reviewed.  Continue Vascepa. Diet and nutrition discussed.  Andrew was seen today for headache.  Diagnoses and all orders for this visit:  Other headache syndrome -     Ibuprofen & Acetaminophen 600 &  500 MG TBPK; Take 1 tablet by mouth every 8 (eight) hours as needed.  Hypertensive heart disease with chronic systolic congestive heart failure (HCC)  End stage renal disease on dialysis ([place])  Mixed hyperlipidemia  Hyperlipidemia, unspecified hyperlipidemia type    Patient Instructions   General Headache Without Cause A headache is pain or discomfort that is felt around the head or neck area. There are many causes and types of headaches. In some cases, the cause may not be found. Follow these instructions at home: Watch your condition for any changes. Let your doctor know about them. Take these steps to help with your condition: Managing pain  Take over-the-counter and prescription medicines only as told by your doctor.  Lie down in a dark, quiet room when you have a headache.  If told, put ice on your head and neck area: ? Put ice in a plastic bag. ? Place a towel between your skin and the bag. ? Leave the ice on for 20 minutes, 2-3 times per day.  If told, put heat on the affected area. Use the heat source that your doctor recommends, such as a moist heat pack or a heating pad. ? Place a towel between your skin and the heat source. ? Leave the heat on for 20-30 minutes. ? Remove the heat if your skin turns bright red. This is very important if you are unable to feel pain, heat, or cold. You may have a greater risk of getting burned.  Keep lights dim if bright lights bother you or make your headaches worse.  Eating and drinking  Eat meals on a regular schedule.  If you drink alcohol: ? Limit how much you use to:  0-1 drink a day for women.  0-2 drinks a day for men. ? Be aware of how much alcohol is in your drink. In the [place] one drink equals one 12 oz bottle of beer (355 mL), one 5 oz glass of wine (148 mL), or one 1 oz glass of hard liquor (44 mL).  Stop drinking caffeine, or reduce how much caffeine you drink. General instructions  Keep a journal to find  out if certain things bring on headaches. For example, write down: ? What you eat and drink. ? How much sleep you get. ? Any change to your diet or medicines.  Get a massage or try other ways to relax.  Limit stress.  Sit up straight. Do not tighten (tense) your muscles.  Do not use any products that contain nicotine or tobacco. This includes cigarettes, e-cigarettes, and chewing tobacco. If you need help quitting, ask your doctor.  Exercise regularly as told by your doctor.  Get enough sleep. This often means 7-9 hours of sleep each night.  Keep all follow-up visits as told by your doctor. This is important.   Contact a doctor if:  Your symptoms are not helped by medicine.  You have a headache that feels different than the other headaches.  You feel sick to your stomach (nauseous) or you throw up (vomit).  You have a fever. Get help right away if:  Your headache gets very bad quickly.  Your headache gets worse after a lot of physical activity.  You keep throwing up.  You have a stiff neck.  You have trouble seeing.  You have trouble speaking.  You have pain in the eye or ear.  Your muscles are weak or you lose muscle control.  You lose your balance or have trouble walking.  You feel like you will pass out (faint) or you pass out.  You are mixed up (confused).  You have a seizure. Summary  A headache is pain or discomfort that is felt around the head or neck area.  There are many causes and types of headaches. In some cases, the cause may not be found.  Keep a journal to help find out what causes your headaches. Watch your condition for any changes. Let your doctor know about them.  Contact a doctor if you have a headache that is different from usual, or if your headache is not helped by medicine.  Get help right away if your headache gets very bad, you throw up, you have trouble seeing, you lose your balance, or you have a seizure. This information is  not intended to replace advice given to you by your health care provider. Make sure you discuss any questions you have with your health care provider. Document Revised: 08/21/2017 Document Reviewed: 08/21/2017 Elsevier Patient Education  2021 [place], MD [facility]

## 2020-07-08 DIAGNOSIS — D509 Iron deficiency anemia, unspecified: Secondary | ICD-10-CM | POA: Diagnosis not present

## 2020-07-08 DIAGNOSIS — Z992 Dependence on renal dialysis: Secondary | ICD-10-CM | POA: Diagnosis not present

## 2020-07-08 DIAGNOSIS — N186 End stage renal disease: Secondary | ICD-10-CM | POA: Diagnosis not present

## 2020-07-08 DIAGNOSIS — N2581 Secondary hyperparathyroidism of renal origin: Secondary | ICD-10-CM | POA: Diagnosis not present

## 2020-07-09 ENCOUNTER — Other Ambulatory Visit: Payer: Self-pay | Admitting: Internal Medicine

## 2020-07-09 MED ORDER — ICOSAPENT ETHYL 1 G PO CAPS: 2.0000 g | ORAL_CAPSULE | Freq: Two times a day (BID) | ORAL | 2 refills | Status: DC

## 2020-07-10 DIAGNOSIS — N2581 Secondary hyperparathyroidism of renal origin: Secondary | ICD-10-CM | POA: Diagnosis not present

## 2020-07-10 DIAGNOSIS — Z992 Dependence on renal dialysis: Secondary | ICD-10-CM | POA: Diagnosis not present

## 2020-07-10 DIAGNOSIS — N186 End stage renal disease: Secondary | ICD-10-CM | POA: Diagnosis not present

## 2020-07-10 DIAGNOSIS — D509 Iron deficiency anemia, unspecified: Secondary | ICD-10-CM | POA: Diagnosis not present

## 2020-07-13 DIAGNOSIS — Z23 Encounter for immunization: Secondary | ICD-10-CM | POA: Diagnosis not present

## 2020-07-13 DIAGNOSIS — N186 End stage renal disease: Secondary | ICD-10-CM | POA: Diagnosis not present

## 2020-07-13 DIAGNOSIS — Z992 Dependence on renal dialysis: Secondary | ICD-10-CM | POA: Diagnosis not present

## 2020-07-13 DIAGNOSIS — N2581 Secondary hyperparathyroidism of renal origin: Secondary | ICD-10-CM | POA: Diagnosis not present

## 2020-07-14 DIAGNOSIS — N186 End stage renal disease: Secondary | ICD-10-CM | POA: Diagnosis not present

## 2020-07-14 DIAGNOSIS — I129 Hypertensive chronic kidney disease with stage 1 through stage 4 chronic kidney disease, or unspecified chronic kidney disease: Secondary | ICD-10-CM | POA: Diagnosis not present

## 2020-07-14 DIAGNOSIS — Z992 Dependence on renal dialysis: Secondary | ICD-10-CM | POA: Diagnosis not present

## 2020-07-15 DIAGNOSIS — Z992 Dependence on renal dialysis: Secondary | ICD-10-CM | POA: Diagnosis not present

## 2020-07-15 DIAGNOSIS — N186 End stage renal disease: Secondary | ICD-10-CM | POA: Diagnosis not present

## 2020-07-15 DIAGNOSIS — N2581 Secondary hyperparathyroidism of renal origin: Secondary | ICD-10-CM | POA: Diagnosis not present

## 2020-07-15 DIAGNOSIS — D509 Iron deficiency anemia, unspecified: Secondary | ICD-10-CM | POA: Diagnosis not present

## 2020-07-17 DIAGNOSIS — N2581 Secondary hyperparathyroidism of renal origin: Secondary | ICD-10-CM | POA: Diagnosis not present

## 2020-07-17 DIAGNOSIS — D509 Iron deficiency anemia, unspecified: Secondary | ICD-10-CM | POA: Diagnosis not present

## 2020-07-17 DIAGNOSIS — N186 End stage renal disease: Secondary | ICD-10-CM | POA: Diagnosis not present

## 2020-07-17 DIAGNOSIS — Z992 Dependence on renal dialysis: Secondary | ICD-10-CM | POA: Diagnosis not present

## 2020-07-23 ENCOUNTER — Other Ambulatory Visit: Payer: Self-pay

## 2020-07-23 DIAGNOSIS — Z992 Dependence on renal dialysis: Secondary | ICD-10-CM

## 2020-07-23 DIAGNOSIS — N186 End stage renal disease: Secondary | ICD-10-CM

## 2020-07-27 DIAGNOSIS — N2581 Secondary hyperparathyroidism of renal origin: Secondary | ICD-10-CM | POA: Diagnosis not present

## 2020-07-27 DIAGNOSIS — Z992 Dependence on renal dialysis: Secondary | ICD-10-CM | POA: Diagnosis not present

## 2020-07-27 DIAGNOSIS — Z23 Encounter for immunization: Secondary | ICD-10-CM | POA: Diagnosis not present

## 2020-07-27 DIAGNOSIS — N186 End stage renal disease: Secondary | ICD-10-CM | POA: Diagnosis not present

## 2020-07-29 DIAGNOSIS — N186 End stage renal disease: Secondary | ICD-10-CM | POA: Diagnosis not present

## 2020-07-29 DIAGNOSIS — Z992 Dependence on renal dialysis: Secondary | ICD-10-CM | POA: Diagnosis not present

## 2020-07-29 DIAGNOSIS — Z23 Encounter for immunization: Secondary | ICD-10-CM | POA: Diagnosis not present

## 2020-07-29 DIAGNOSIS — N2581 Secondary hyperparathyroidism of renal origin: Secondary | ICD-10-CM | POA: Diagnosis not present

## 2020-07-30 ENCOUNTER — Ambulatory Visit: Payer: Medicare Other

## 2020-07-31 DIAGNOSIS — Z23 Encounter for immunization: Secondary | ICD-10-CM | POA: Diagnosis not present

## 2020-07-31 DIAGNOSIS — Z992 Dependence on renal dialysis: Secondary | ICD-10-CM | POA: Diagnosis not present

## 2020-07-31 DIAGNOSIS — N2581 Secondary hyperparathyroidism of renal origin: Secondary | ICD-10-CM | POA: Diagnosis not present

## 2020-07-31 DIAGNOSIS — N186 End stage renal disease: Secondary | ICD-10-CM | POA: Diagnosis not present

## 2020-08-03 DIAGNOSIS — N2581 Secondary hyperparathyroidism of renal origin: Secondary | ICD-10-CM | POA: Diagnosis not present

## 2020-08-03 DIAGNOSIS — Z992 Dependence on renal dialysis: Secondary | ICD-10-CM | POA: Diagnosis not present

## 2020-08-03 DIAGNOSIS — N186 End stage renal disease: Secondary | ICD-10-CM | POA: Diagnosis not present

## 2020-08-05 DIAGNOSIS — N2581 Secondary hyperparathyroidism of renal origin: Secondary | ICD-10-CM | POA: Diagnosis not present

## 2020-08-05 DIAGNOSIS — Z992 Dependence on renal dialysis: Secondary | ICD-10-CM | POA: Diagnosis not present

## 2020-08-05 DIAGNOSIS — N186 End stage renal disease: Secondary | ICD-10-CM | POA: Diagnosis not present

## 2020-08-07 DIAGNOSIS — N186 End stage renal disease: Secondary | ICD-10-CM | POA: Diagnosis not present

## 2020-08-07 DIAGNOSIS — Z992 Dependence on renal dialysis: Secondary | ICD-10-CM | POA: Diagnosis not present

## 2020-08-07 DIAGNOSIS — N2581 Secondary hyperparathyroidism of renal origin: Secondary | ICD-10-CM | POA: Diagnosis not present

## 2020-08-10 DIAGNOSIS — N186 End stage renal disease: Secondary | ICD-10-CM | POA: Diagnosis not present

## 2020-08-10 DIAGNOSIS — Z992 Dependence on renal dialysis: Secondary | ICD-10-CM | POA: Diagnosis not present

## 2020-08-10 DIAGNOSIS — N2581 Secondary hyperparathyroidism of renal origin: Secondary | ICD-10-CM | POA: Diagnosis not present

## 2020-08-11 ENCOUNTER — Ambulatory Visit (HOSPITAL_COMMUNITY)
Admission: RE | Admit: 2020-08-11 | Discharge: 2020-08-11 | Disposition: A | Discharge status: Home / Self Care | Payer: Medicare Other | Source: Ambulatory Visit | Attending: Vascular Surgery | Admitting: Vascular Surgery

## 2020-08-11 ENCOUNTER — Ambulatory Visit: Payer: Medicare Other | Admitting: Physician Assistant

## 2020-08-11 ENCOUNTER — Other Ambulatory Visit: Payer: Self-pay | Admitting: Vascular Surgery

## 2020-08-11 ENCOUNTER — Other Ambulatory Visit: Payer: Self-pay

## 2020-08-11 VITALS — BP 127/74 | HR 83 | Temp 98.0°F | Resp 20 | Ht 69.0 in | Wt 245.8 lb

## 2020-08-11 DIAGNOSIS — Z992 Dependence on renal dialysis: Secondary | ICD-10-CM | POA: Diagnosis not present

## 2020-08-11 DIAGNOSIS — N186 End stage renal disease: Secondary | ICD-10-CM | POA: Diagnosis not present

## 2020-08-11 DIAGNOSIS — M7989 Other specified soft tissue disorders: Secondary | ICD-10-CM

## 2020-08-11 DIAGNOSIS — R601 Generalized edema: Secondary | ICD-10-CM

## 2020-08-11 NOTE — Progress Notes (Signed)
[facility] [place] HISTORY AND PHYSICAL   History of Present Illness:  Patient is a 55 y.o. year old male who presents for exam of his left UE AV fistula with a CC of numbness int eh volar forearm and in both hands during the night.  He states the forearm numbness has been there since his second fistula surgery.  It goes into the 4th and 5th fingers.  He denise pain and loss of motor in the left UE.  He denise access problems on HD.    He is also worried about problems with his legs and swelling on/off.  He states he had sever swelling before he started HD and now it comes and goes.  He denise skin changes, weeping, or wounds on B LE.      Past Medical History:  Diagnosis Date   Anemia    Back pain    CKD (chronic kidney disease), stage III (HCC)    Stage 4   Colon polyps    adenomatous   Elevated cholesterol    History of echocardiogram    Echo 11/17: EF 65-70, normal wall motion, grade 1 diastolic dysfunction, trivial MR, mild LAE, mild TR   Hyperlipidemia    Hypertension    no current bp meds for last 3 months   Kidney stones 2007   Medication intolerance    a. multiple with prior nonadherence to regimen.   Nausea    " in the morning since having the kidney problem "   Otosclerosis of both ears    Pneumonia    PONV (postoperative nausea and vomiting)    PUD (peptic ulcer disease)    Has had unspecified surgery for this   Sleep apnea     Past Surgical History:  Procedure Laterality Date   AV FISTULA PLACEMENT Left 01/04/2019   Procedure: ARTERIOVENOUS (AV) FISTULA CREATION LEFT ARM;  Surgeon: [name], MD;  Location: [facility];  Service: Vascular;  Laterality: Left;   [place] Left 03/11/2019   Procedure: BASILIC VEIN TRANSPOSITION 2ND STAGE LEFT;  Surgeon: [name], MD;  Location: [facility];  Service: Vascular;  Laterality: Left;   NEPHRECTOMY  02/18/2011   Procedure: NEPHRECTOMY;  Surgeon: Hanley Ben, MD;   Location: [facility];  Service: Urology;  Laterality: Right;   SMALL INTESTINE SURGERY     STAPEDOTOMY  2005   lt ear jan, right ear sept   surgery for ulcers  1990     Social History Social History   Tobacco Use   Smoking status: Never   Smokeless tobacco: Never  Vaping Use   Vaping Use: Never used  Substance Use Topics   Alcohol use: No   Drug use: No    Family History Family History  Problem Relation Age of Onset   Hyperlipidemia Father    Heart disease Mother    Hypertension Mother    Hypertension Sister    Heart disease Brother 2       CABG   Hyperlipidemia Sister    Hyperlipidemia Sister    Esophageal cancer Cousin    Healthy Child    Colon cancer Neg Hx    Stomach cancer Neg Hx    Rectal cancer Neg Hx     Allergies  Allergies  Allergen Reactions   Lokelma [Sodium Zirconium Cyclosilicate] Shortness Of Breath   Beef-Derived Products Other (See Comments)    Cultural preference   Pegademase Bovine Other (See Comments)    Cultural  preference   Poractant Alfa Other (See Comments)    Cultural preference   Pork-Derived Products Other (See Comments)    Cultural preference     Current Outpatient Medications  Medication Sig Dispense Refill   B Complex-C-Zn-Folic Acid (DIALYVITE 683 WITH ZINC) 0.8 MG TABS Take 1 tablet by mouth daily.     benzoyl peroxide (BENZOYL PEROXIDE) 5 % external liquid Apply topically 2 (two) times daily. 142 g 12   Betamethasone Sodium Phosphate 6 MG/ML SOLN INJECT 2:4 SYRINGE BILATERALLY INTO EACH KNEE JOINT     calcium acetate (PHOSLO) 667 MG capsule Take 2 capsules (1,334 mg total) by mouth 3 (three) times daily with meals. 180 capsule 3   Ibuprofen & Acetaminophen 600 & 500 MG TBPK Take 1 tablet by mouth every 8 (eight) hours as needed. 20 each 1   icosapent Ethyl (VASCEPA) 1 g capsule Take 2 capsules (2 g total) by mouth 2 (two) times daily. 360 capsule 2   lidocaine-prilocaine (EMLA) cream Apply topically.     Methoxy PEG-Epoetin  Beta (MIRCERA IJ) Mircera     pantoprazole (PROTONIX) 40 MG tablet Take 1 tablet (40 mg total) by mouth daily. 30 tablet 5   Rimegepant Sulfate (NURTEC) 75 MG TBDP Take 75 mg by mouth as needed. Maxium 1 tab in 24 hours 8 tablet 5   amLODipine (NORVASC) 5 MG tablet Take 1 tablet (5 mg total) by mouth daily. 30 tablet 0   carvedilol (COREG) 25 MG tablet Take 1 tablet (25 mg total) by mouth 2 (two) times daily with a meal. 60 tablet 0   hydrALAZINE (APRESOLINE) 25 MG tablet Take 1 tablet (25 mg total) by mouth every 8 (eight) hours. 90 tablet 0   No current facility-administered medications for this visit.    ROS:   General:  No weight loss, Fever, chills  HEENT: No recent headaches, no nasal bleeding, no visual changes, no sore throat  Neurologic: No dizziness, blackouts, seizures. No recent symptoms of stroke or mini- stroke. No recent episodes of slurred speech, or temporary blindness.  Cardiac: No recent episodes of chest pain/pressure, no shortness of breath at rest.  No shortness of breath with exertion.  Denies history of atrial fibrillation or irregular heartbeat  Vascular: No history of rest pain in feet.  No history of claudication.  No history of non-healing ulcer, No history of DVT   Pulmonary: No home oxygen, no productive cough, no hemoptysis,  No asthma or wheezing  Musculoskeletal:  [ ]  Arthritis, [ ]  Low back pain,  [ ]  Joint pain  Hematologic:No history of hypercoagulable state.  No history of easy bleeding.  No history of anemia  Gastrointestinal: No hematochezia or melena,  No gastroesophageal reflux, no trouble swallowing  Urinary: [ ]  chronic Kidney disease, [ x] on HD - [ x] MWF or [ ]  TTHS, [ ]  Burning with urination, [ ]  Frequent urination, [ ]  Difficulty urinating;   Skin: No rashes  Psychological: No history of anxiety,  No history of depression   Physical Examination  Vitals:   08/11/20 1148  BP: 127/74  Pulse: 83  Resp: 20  Temp: 98 F (36.7 C)   TempSrc: Temporal  SpO2: 98%  Weight: 245 lb 12.8 oz (111.5 kg)  Height: 5\' 9"  (1.753 m)    Body mass index is 36.3 kg/m.  General:  Alert and oriented, no acute distress HEENT: Normal Neck: No bruit or JVD Pulmonary: Clear to auscultation bilaterally Cardiac: Regular Rate and Rhythm without  murmur Gastrointestinal: Soft, non-tender, non-distended, no mass, no scars Skin: No rash Extremity Pulses:  2+ radial, brachial pulses bilaterally, left UE has an excellent thrill. He has palpable pedal pulses, no visible edema in B LE.  No obvious varicose veins.   Musculoskeletal: No deformity or edema  Neurologic: Upper and lower extremity motor 5/5 and symmetric  DATA:  Findings:  +--------------------+----------+-----------------+--------+  AVF                 PSV (cm/s)Flow Vol (mL/min)Comments  +--------------------+----------+-----------------+--------+  Native artery inflow   173          3263                 +--------------------+----------+-----------------+--------+  AVF Anastomosis        427                               +--------------------+----------+-----------------+--------+      +------------+----------+-------------+----------+------------------+  OUTFLOW VEINPSV (cm/s)Diameter (cm)Depth (cm)     Describe       +------------+----------+-------------+----------+------------------+  Shoulder       166        1.04        0.56                       +------------+----------+-------------+----------+------------------+  Prox UA         92        1.45        0.26                       +------------+----------+-------------+----------+------------------+  Mid UA         329        1.21        0.26                       +------------+----------+-------------+----------+------------------+  Dist UA        754        0.48        0.70   change in Diameter  +------------+----------+-------------+----------+------------------+  AC  Fossa       199        1.05        0.63                       +------------+----------+-------------+----------+------------------+   Summary:  Patent arteriovenous fistula. Change in velocity and diameter of basilic  outflow  vein in the distal upper arm.    ASSESSMENT/PLAN: ESRD with well functioning AV fistula that is patent. The forearm numbness is permanent at this point with likely nerve injury during surgery to creat a function fistula.  He states this is tolerable.  He may have symptoms of carpal tunnel B due to his symptoms of numbness in B hands while sleeping I will defer further work up to his PCP.  He is concerned about his veins and arteries in his legs.  He has palpable pedal pulses on exam.  No edema today on exam, but he states it comes and goes.  I gave him the vein handout and suggested elevation while at rest, exercise and mild compression 15-20 mm hg to be worn daily and taken off at night.  I have scheduled a reflux study and follow up visit to discuss the study.  If the study is negative his edema is likely  associated with his ESRD.        Roxy Horseman PA-C [facility] [place] Office: [phone]  MD in clinic [place]

## 2020-08-12 DIAGNOSIS — N2581 Secondary hyperparathyroidism of renal origin: Secondary | ICD-10-CM | POA: Diagnosis not present

## 2020-08-12 DIAGNOSIS — Z992 Dependence on renal dialysis: Secondary | ICD-10-CM | POA: Diagnosis not present

## 2020-08-12 DIAGNOSIS — N186 End stage renal disease: Secondary | ICD-10-CM | POA: Diagnosis not present

## 2020-08-13 DIAGNOSIS — Z01818 Encounter for other preprocedural examination: Secondary | ICD-10-CM | POA: Diagnosis not present

## 2020-08-13 DIAGNOSIS — Z992 Dependence on renal dialysis: Secondary | ICD-10-CM | POA: Diagnosis not present

## 2020-08-13 DIAGNOSIS — Z7682 Awaiting organ transplant status: Secondary | ICD-10-CM | POA: Diagnosis not present

## 2020-08-13 DIAGNOSIS — I10 Essential (primary) hypertension: Secondary | ICD-10-CM | POA: Diagnosis not present

## 2020-08-13 DIAGNOSIS — I120 Hypertensive chronic kidney disease with stage 5 chronic kidney disease or end stage renal disease: Secondary | ICD-10-CM | POA: Diagnosis not present

## 2020-08-13 DIAGNOSIS — I129 Hypertensive chronic kidney disease with stage 1 through stage 4 chronic kidney disease, or unspecified chronic kidney disease: Secondary | ICD-10-CM | POA: Diagnosis not present

## 2020-08-13 DIAGNOSIS — Z905 Acquired absence of kidney: Secondary | ICD-10-CM | POA: Diagnosis not present

## 2020-08-13 DIAGNOSIS — G4733 Obstructive sleep apnea (adult) (pediatric): Secondary | ICD-10-CM | POA: Diagnosis not present

## 2020-08-13 DIAGNOSIS — N186 End stage renal disease: Secondary | ICD-10-CM | POA: Diagnosis not present

## 2020-08-14 ENCOUNTER — Other Ambulatory Visit: Payer: Self-pay

## 2020-08-14 DIAGNOSIS — N186 End stage renal disease: Secondary | ICD-10-CM

## 2020-09-03 NOTE — Progress Notes (Signed)
[name] (KeyMike Craze) Rx #: [id number] Nurtec 75MG  dispersible tablets   Form [employer] [place] Medicare Part D Electronic Request Form (CB) Created 6 hours ago Sent to Plan 5 minutes ago Plan Response 5 minutes ago Submit Clinical Questions Determination Clinical Questions Are Ready Fill out the questions below and click "Send to Plan." The plan requires answers to the clinical questions for this electronic prior authorization.

## 2020-09-08 ENCOUNTER — Telehealth: Payer: Self-pay

## 2020-09-08 NOTE — Telephone Encounter (Signed)
**Note De-Identified  Obfuscation** I started a Vascepa PA through covermymeds. Key: [id number]

## 2020-09-14 ENCOUNTER — Encounter (HOSPITAL_COMMUNITY): Payer: Self-pay

## 2020-09-14 DIAGNOSIS — N2581 Secondary hyperparathyroidism of renal origin: Secondary | ICD-10-CM | POA: Diagnosis not present

## 2020-09-14 DIAGNOSIS — Z992 Dependence on renal dialysis: Secondary | ICD-10-CM | POA: Diagnosis not present

## 2020-09-14 DIAGNOSIS — D631 Anemia in chronic kidney disease: Secondary | ICD-10-CM | POA: Diagnosis not present

## 2020-09-14 DIAGNOSIS — N186 End stage renal disease: Secondary | ICD-10-CM | POA: Diagnosis not present

## 2020-09-15 ENCOUNTER — Ambulatory Visit (HOSPITAL_COMMUNITY)
Admission: RE | Admit: 2020-09-15 | Discharge: 2020-09-15 | Disposition: A | Discharge status: Home / Self Care | Payer: Medicare HMO | Source: Ambulatory Visit | Attending: Vascular Surgery | Admitting: Vascular Surgery

## 2020-09-15 ENCOUNTER — Ambulatory Visit (INDEPENDENT_AMBULATORY_CARE_PROVIDER_SITE_OTHER): Payer: Medicare HMO | Admitting: Physician Assistant

## 2020-09-15 ENCOUNTER — Other Ambulatory Visit: Payer: Self-pay

## 2020-09-15 VITALS — BP 153/83 | HR 80 | Temp 98.2°F | Ht 69.0 in | Wt 243.3 lb

## 2020-09-15 DIAGNOSIS — N186 End stage renal disease: Secondary | ICD-10-CM

## 2020-09-15 DIAGNOSIS — Z992 Dependence on renal dialysis: Secondary | ICD-10-CM

## 2020-09-15 DIAGNOSIS — I872 Venous insufficiency (chronic) (peripheral): Secondary | ICD-10-CM | POA: Diagnosis not present

## 2020-09-15 NOTE — Progress Notes (Signed)
Office Note     CC:  follow up Requesting Provider:  Horald Barnett, *  HPI: [name] is a 55 y.o. (Jun 07, 1965) male who presents for follow up of lower extremity swelling. He was recently seen on 08/11/20 to follow up on left upper extremity AV fistula with some steal symptoms. At that visit he was also concerned about lower extremity swelling. He was scheduled to follow up with non invasive venous duplex studies  Used video interpreter to assist with patients visit today. He explains that the swelling is better since being on dialysis. It does still come and go. He was advised to elevate and was given 15-20 mmHg knee high compression stockings at his last visit. He has been wearing them intermittently as well as elevating. No real change. His biggest complaint today is concern about his overall circulation and worry about having clots or stroke because of circulation. He says after dialysis he feels very fatigued, loss of appetite, headaches, and feels pains all over his body as well as feels like no blood flow to his head. He says he has talked with Nephrologists at dialysis center and they have tried making changes to his treatments but he still feels like this is present.He also explains that he cannot squat down to pick objects up off of floor or anything on a low shelf. He says he cannot get back up from squatted position and has to push himself back up. He says he use to be able to do this without issues. Uncertain if this is because of blood not flowing in his legs.  He denies any issues with left AV fistula. He still has some numbness in forearm but denies any pain, coldness, numbness of hand or weakness.   Past Medical History:  Diagnosis Date   Anemia    Back pain    CKD (chronic kidney disease), stage III (HCC)    Stage 4   Colon polyps    adenomatous   Elevated cholesterol    History of echocardiogram    Echo 11/17: EF 65-70, normal wall motion, grade 1 diastolic  dysfunction, trivial MR, mild LAE, mild TR   Hyperlipidemia    Hypertension    no current bp meds for last 3 months   Kidney stones 2007   Medication intolerance    a. multiple with prior nonadherence to regimen.   Nausea    " in the morning since having the kidney problem "   Otosclerosis of both ears    Pneumonia    PONV (postoperative nausea and vomiting)    PUD (peptic ulcer disease)    Has had unspecified surgery for this   Sleep apnea     Past Surgical History:  Procedure Laterality Date   AV FISTULA PLACEMENT Left 01/04/2019   Procedure: ARTERIOVENOUS (AV) FISTULA CREATION LEFT ARM;  Surgeon: [name], MD;  Location: [facility];  Service: Vascular;  Laterality: Left;   [place] Left 03/11/2019   Procedure: BASILIC VEIN TRANSPOSITION 2ND STAGE LEFT;  Surgeon: [name], MD;  Location: [facility];  Service: Vascular;  Laterality: Left;   NEPHRECTOMY  02/18/2011   Procedure: NEPHRECTOMY;  Surgeon: Hanley Ben, MD;  Location: [facility];  Service: Urology;  Laterality: Right;   SMALL INTESTINE SURGERY     STAPEDOTOMY  2005   lt ear jan, right ear sept   surgery for ulcers  1990    Social History   Socioeconomic History   Marital status:  Married    Spouse name: Salya   Number of children: 5   Years of education: Not on file   Highest education level: 12th grade  Occupational History    Employer: [employer]  Tobacco Use   Smoking status: Never   Smokeless tobacco: Never  Vaping Use   Vaping Use: Never used  Substance and Sexual Activity   Alcohol use: No   Drug use: No   Sexual activity: Yes    Birth control/protection: None  Other Topics Concern   Not on file  Social History Narrative   Lives at home with wife and family. He is from [place]. Came to the [place] in 2002.      Patient is right-handed. He lives in a one level home. He drinks 1-2 cups of coffee and tea a day. He does not exercise.   Social Determinants of Health    Financial Resource Strain: Not on file  Food Insecurity: Not on file  Transportation Needs: Not on file  Physical Activity: Not on file  Stress: Not on file  Social Connections: Not on file  Intimate Partner Violence: Not on file    Family History  Problem Relation Age of Onset   Hyperlipidemia Father    Heart disease Mother    Hypertension Mother    Hypertension Sister    Heart disease Brother 52       CABG   Hyperlipidemia Sister    Hyperlipidemia Sister    Esophageal cancer Cousin    Healthy Child    Colon cancer Neg Hx    Stomach cancer Neg Hx    Rectal cancer Neg Hx     Current Outpatient Medications  Medication Sig Dispense Refill   amLODipine (NORVASC) 5 MG tablet Take 1 tablet (5 mg total) by mouth daily. 30 tablet 0   B Complex-C-Zn-Folic Acid (DIALYVITE 798 WITH ZINC) 0.8 MG TABS Take 1 tablet by mouth daily.     benzoyl peroxide (BENZOYL PEROXIDE) 5 % external liquid Apply topically 2 (two) times daily. 142 g 12   Betamethasone Sodium Phosphate 6 MG/ML SOLN INJECT 2:4 SYRINGE BILATERALLY INTO EACH KNEE JOINT     carvedilol (COREG) 25 MG tablet Take 1 tablet (25 mg total) by mouth 2 (two) times daily with a meal. 60 tablet 0   hydrALAZINE (APRESOLINE) 25 MG tablet Take 1 tablet (25 mg total) by mouth every 8 (eight) hours. 90 tablet 0   icosapent Ethyl (VASCEPA) 1 g capsule Take 2 capsules (2 g total) by mouth 2 (two) times daily. 360 capsule 2   lidocaine-prilocaine (EMLA) cream Apply topically.     calcium acetate (PHOSLO) 667 MG capsule Take 2 capsules (1,334 mg total) by mouth 3 (three) times daily with meals. (Patient not taking: Reported on 09/15/2020) 180 capsule 3   Ibuprofen & Acetaminophen 600 & 500 MG TBPK Take 1 tablet by mouth every 8 (eight) hours as needed. (Patient not taking: Reported on 09/15/2020) 20 each 1   Methoxy PEG-Epoetin Beta (MIRCERA IJ) Mircera     pantoprazole (PROTONIX) 40 MG tablet Take 1 tablet (40 mg total) by mouth daily. (Patient  not taking: Reported on 09/15/2020) 30 tablet 5   Rimegepant Sulfate (NURTEC) 75 MG TBDP Take 75 mg by mouth as needed. Maxium 1 tab in 24 hours (Patient not taking: Reported on 09/15/2020) 8 tablet 5   No current facility-administered medications for this visit.    Allergies  Allergen Reactions   Lokelma [Sodium Zirconium Cyclosilicate] Shortness  Of Breath   Beef-Derived Products Other (See Comments)    Cultural preference   Pegademase Bovine Other (See Comments)    Cultural preference   Poractant Alfa Other (See Comments)    Cultural preference   Pork-Derived Products Other (See Comments)    Cultural preference     REVIEW OF SYSTEMS:   [X]  denotes positive finding, [ ]  denotes negative finding Cardiac  Comments:  Chest pain or chest pressure:    Shortness of breath upon exertion:    Short of breath when lying flat:    Irregular heart rhythm:        Vascular    Pain in calf, thigh, or hip brought on by ambulation:    Pain in feet at night that wakes you up from your sleep:     Blood clot in your veins:    Leg swelling:  X       Pulmonary    Oxygen at home:    Productive cough:     Wheezing:         Neurologic    Sudden weakness in arms or legs:     Sudden numbness in arms or legs:     Sudden onset of difficulty speaking or slurred speech:    Temporary loss of vision in one eye:     Problems with dizziness:         Gastrointestinal    Blood in stool:     Vomited blood:         Genitourinary    Burning when urinating:     Blood in urine:        Psychiatric    Major depression:         Hematologic    Bleeding problems:    Problems with blood clotting too easily:        Skin    Rashes or ulcers:        Constitutional    Fever or chills:      PHYSICAL EXAMINATION:  Vitals:   09/15/20 1139  BP: (!) 153/83  Pulse: 80  Temp: 98.2 F (36.8 C)  TempSrc: Temporal  SpO2: 96%  Weight: 243 lb 4.8 oz (110.4 kg)  Height: 5\' 9"  (1.753 m)    General:   WDWN in NAD; vital signs documented above Gait: Normal HENT: WNL, normocephalic Pulmonary: normal non-labored breathing Cardiac: regular HR, without  Murmurs without carotid bruit Vascular Exam/Pulses: 2+ radial and Dp pulses bilaterally. Feet warm and well perfused. No edema bilaterally, no varicose veins or reticular veins present Musculoskeletal: no muscle wasting or atrophy  Neurologic: A&O X 3;  No focal weakness or paresthesias are detected Psychiatric:  The pt has Normal affect.   Non-Invasive Vascular Imaging:   BLE Venous Insufficiency Duplex (09/14/20):  RLE:  No DVT and SVT GSV reflux  GSV diameter 0.31-0.33 No SSV reflux  CFV deep venous reflux  LLE: No DVT and SVT GSV reflux SFJ to distal thigh GSV diameter >.40 No SSV reflux  CFV deep venous reflux    ASSESSMENT/PLAN:: 55 y.o. male here for follow up for bilateral lower extremity swelling. His duplex today shows no DVT or SVT. He does have mild amount of deep reflux on right otherwise normal venous study, and left leg has both deep and superficial reflux. His symptoms that he is most concerned about are more musculoskeletal in nature and I discussed with him that his venous reflux would not cause inability to get up. I think he  would best be managed with conservative therapy. He could be candidate for venous lazer ablation of the LLE if more symptomatic in the leg.  - I have encouraged him to elevate his legs daily, wear the knee high compression stockings, refrain from prolonged sitting or standing - provided reassurance that his vein condition is not life threatening and does not increase his risk for stroke, etc - recommend he continue to follow up with Nephrologist at dialysis regarding his generalized fatigued, headaches, etc after dialysis but provided reassurance that this is common after dialysis treatments -He will follow up as needed if he has new or concerning symptoms - his left AV fistula is functioning well  and he will follow up as needed for this   Karoline Caldwell, PA-C [facility] [phone]  Clinic MD:   [facility]

## 2020-09-16 ENCOUNTER — Other Ambulatory Visit: Payer: Self-pay | Admitting: Internal Medicine

## 2020-09-16 DIAGNOSIS — N186 End stage renal disease: Secondary | ICD-10-CM | POA: Diagnosis not present

## 2020-09-16 DIAGNOSIS — D631 Anemia in chronic kidney disease: Secondary | ICD-10-CM | POA: Diagnosis not present

## 2020-09-16 DIAGNOSIS — Z992 Dependence on renal dialysis: Secondary | ICD-10-CM | POA: Diagnosis not present

## 2020-09-16 DIAGNOSIS — G4733 Obstructive sleep apnea (adult) (pediatric): Secondary | ICD-10-CM | POA: Diagnosis not present

## 2020-09-16 DIAGNOSIS — N2581 Secondary hyperparathyroidism of renal origin: Secondary | ICD-10-CM | POA: Diagnosis not present

## 2020-09-18 DIAGNOSIS — N186 End stage renal disease: Secondary | ICD-10-CM | POA: Diagnosis not present

## 2020-09-18 DIAGNOSIS — N2581 Secondary hyperparathyroidism of renal origin: Secondary | ICD-10-CM | POA: Diagnosis not present

## 2020-09-18 DIAGNOSIS — Z992 Dependence on renal dialysis: Secondary | ICD-10-CM | POA: Diagnosis not present

## 2020-09-18 DIAGNOSIS — D631 Anemia in chronic kidney disease: Secondary | ICD-10-CM | POA: Diagnosis not present

## 2020-09-21 DIAGNOSIS — N186 End stage renal disease: Secondary | ICD-10-CM | POA: Diagnosis not present

## 2020-09-21 DIAGNOSIS — N2581 Secondary hyperparathyroidism of renal origin: Secondary | ICD-10-CM | POA: Diagnosis not present

## 2020-09-21 DIAGNOSIS — Z992 Dependence on renal dialysis: Secondary | ICD-10-CM | POA: Diagnosis not present

## 2020-09-21 DIAGNOSIS — D631 Anemia in chronic kidney disease: Secondary | ICD-10-CM | POA: Diagnosis not present

## 2020-09-21 NOTE — Telephone Encounter (Signed)
Routed to [facility] Via LPN for update on prior [employer] -- unsure if this notice received is in response to PA submission or just sent automatically

## 2020-09-21 NOTE — Telephone Encounter (Signed)
**Note De-Identified  Obfuscation** I called [employer] and s/w [name] who transferred my call to [name] and I attempted to do this PA with [place] over the phone.  Per [place] a PA is not required for Vascepa but that a refill is being requested too soon as the pts next refill is not due until 8/28.   I called [facility] and was unable to s/w the pharmacist of staff member as I called during their meal break so I left a message on their VM advising that a Vascepa PA is not required per [facility] but that a refill was requested to soon.  I did leave my name and the office phone number so they can call me back if they have questions.  I then called the pt and he stated that he has enough Vascepa on hand to last him until next month. He is aware that he can request his next Vascepa refill on 8/28. He thanked me for calling him to discuss.

## 2020-09-21 NOTE — Telephone Encounter (Signed)
Received fax from [facility] that [name] is not on their list of covered drugs (formulary). Patient has been given a temporary authorization for 30 day supply by insurance  Appeal: [place] Phone: [phone] Fax: [phone]

## 2020-09-23 DIAGNOSIS — N2581 Secondary hyperparathyroidism of renal origin: Secondary | ICD-10-CM | POA: Diagnosis not present

## 2020-09-23 DIAGNOSIS — N186 End stage renal disease: Secondary | ICD-10-CM | POA: Diagnosis not present

## 2020-09-23 DIAGNOSIS — D631 Anemia in chronic kidney disease: Secondary | ICD-10-CM | POA: Diagnosis not present

## 2020-09-23 DIAGNOSIS — Z992 Dependence on renal dialysis: Secondary | ICD-10-CM | POA: Diagnosis not present

## 2020-09-25 DIAGNOSIS — D631 Anemia in chronic kidney disease: Secondary | ICD-10-CM | POA: Diagnosis not present

## 2020-09-25 DIAGNOSIS — Z992 Dependence on renal dialysis: Secondary | ICD-10-CM | POA: Diagnosis not present

## 2020-09-25 DIAGNOSIS — N186 End stage renal disease: Secondary | ICD-10-CM | POA: Diagnosis not present

## 2020-09-25 DIAGNOSIS — N2581 Secondary hyperparathyroidism of renal origin: Secondary | ICD-10-CM | POA: Diagnosis not present

## 2020-09-28 DIAGNOSIS — R52 Pain, unspecified: Secondary | ICD-10-CM | POA: Diagnosis not present

## 2020-09-28 DIAGNOSIS — Z992 Dependence on renal dialysis: Secondary | ICD-10-CM | POA: Diagnosis not present

## 2020-09-28 DIAGNOSIS — N2581 Secondary hyperparathyroidism of renal origin: Secondary | ICD-10-CM | POA: Diagnosis not present

## 2020-09-28 DIAGNOSIS — D631 Anemia in chronic kidney disease: Secondary | ICD-10-CM | POA: Diagnosis not present

## 2020-09-28 DIAGNOSIS — N186 End stage renal disease: Secondary | ICD-10-CM | POA: Diagnosis not present

## 2020-09-30 DIAGNOSIS — N2581 Secondary hyperparathyroidism of renal origin: Secondary | ICD-10-CM | POA: Diagnosis not present

## 2020-09-30 DIAGNOSIS — D631 Anemia in chronic kidney disease: Secondary | ICD-10-CM | POA: Diagnosis not present

## 2020-09-30 DIAGNOSIS — Z992 Dependence on renal dialysis: Secondary | ICD-10-CM | POA: Diagnosis not present

## 2020-09-30 DIAGNOSIS — N186 End stage renal disease: Secondary | ICD-10-CM | POA: Diagnosis not present

## 2020-09-30 DIAGNOSIS — R52 Pain, unspecified: Secondary | ICD-10-CM | POA: Diagnosis not present

## 2020-10-02 DIAGNOSIS — N2581 Secondary hyperparathyroidism of renal origin: Secondary | ICD-10-CM | POA: Diagnosis not present

## 2020-10-02 DIAGNOSIS — R52 Pain, unspecified: Secondary | ICD-10-CM | POA: Diagnosis not present

## 2020-10-02 DIAGNOSIS — N186 End stage renal disease: Secondary | ICD-10-CM | POA: Diagnosis not present

## 2020-10-02 DIAGNOSIS — D631 Anemia in chronic kidney disease: Secondary | ICD-10-CM | POA: Diagnosis not present

## 2020-10-02 DIAGNOSIS — Z992 Dependence on renal dialysis: Secondary | ICD-10-CM | POA: Diagnosis not present

## 2020-10-05 DIAGNOSIS — N2581 Secondary hyperparathyroidism of renal origin: Secondary | ICD-10-CM | POA: Diagnosis not present

## 2020-10-05 DIAGNOSIS — Z992 Dependence on renal dialysis: Secondary | ICD-10-CM | POA: Diagnosis not present

## 2020-10-05 DIAGNOSIS — N186 End stage renal disease: Secondary | ICD-10-CM | POA: Diagnosis not present

## 2020-10-07 DIAGNOSIS — Z992 Dependence on renal dialysis: Secondary | ICD-10-CM | POA: Diagnosis not present

## 2020-10-07 DIAGNOSIS — N2581 Secondary hyperparathyroidism of renal origin: Secondary | ICD-10-CM | POA: Diagnosis not present

## 2020-10-07 DIAGNOSIS — N186 End stage renal disease: Secondary | ICD-10-CM | POA: Diagnosis not present

## 2020-10-09 ENCOUNTER — Telehealth: Payer: Self-pay

## 2020-10-09 DIAGNOSIS — Z992 Dependence on renal dialysis: Secondary | ICD-10-CM | POA: Diagnosis not present

## 2020-10-09 DIAGNOSIS — N186 End stage renal disease: Secondary | ICD-10-CM | POA: Diagnosis not present

## 2020-10-09 DIAGNOSIS — N2581 Secondary hyperparathyroidism of renal origin: Secondary | ICD-10-CM | POA: Diagnosis not present

## 2020-10-09 NOTE — Telephone Encounter (Signed)
Fax received  from Pt pharmacy, Need an alternaate medication nurtec is no longer covered under pt plan.   [name] when you get a chance can you resend a new PA.

## 2020-10-12 ENCOUNTER — Telehealth: Payer: Self-pay | Admitting: Internal Medicine

## 2020-10-12 DIAGNOSIS — N186 End stage renal disease: Secondary | ICD-10-CM | POA: Diagnosis not present

## 2020-10-12 DIAGNOSIS — Z992 Dependence on renal dialysis: Secondary | ICD-10-CM | POA: Diagnosis not present

## 2020-10-12 DIAGNOSIS — N2581 Secondary hyperparathyroidism of renal origin: Secondary | ICD-10-CM | POA: Diagnosis not present

## 2020-10-12 NOTE — Telephone Encounter (Signed)
Pt c/o medication issue:  1. Name of Medication:   icosapent Ethyl (VASCEPA) 1 g capsule    2. How are you currently taking this medication (dosage and times per day)? As directed  3. Are you having a reaction (difficulty breathing--STAT)? No  4. What is your medication issue?  This medicine is not covered under pt's current coverage, [employer] would like the generic Vascepa 1g capule to be called in to the patients pharmacy

## 2020-10-12 NOTE — Telephone Encounter (Signed)
Ok to Rx for generic Vascepa (icosapent ethyl) 1G capsules - take 2 capsules by mouth twice daily.  Dr Lemmie Evens

## 2020-10-13 MED ORDER — ICOSAPENT ETHYL 1 G PO CAPS: 2.0000 g | ORAL_CAPSULE | Freq: Two times a day (BID) | ORAL | 3 refills | Status: DC

## 2020-10-13 NOTE — Telephone Encounter (Signed)
Received fax from [facility] that patient has opted into this pharmacy for all refills/new meds  Rx sent to this new pharmacy

## 2020-10-14 DIAGNOSIS — N186 End stage renal disease: Secondary | ICD-10-CM | POA: Diagnosis not present

## 2020-10-14 DIAGNOSIS — I129 Hypertensive chronic kidney disease with stage 1 through stage 4 chronic kidney disease, or unspecified chronic kidney disease: Secondary | ICD-10-CM | POA: Diagnosis not present

## 2020-10-14 DIAGNOSIS — Z992 Dependence on renal dialysis: Secondary | ICD-10-CM | POA: Diagnosis not present

## 2020-10-14 DIAGNOSIS — N2581 Secondary hyperparathyroidism of renal origin: Secondary | ICD-10-CM | POA: Diagnosis not present

## 2020-10-16 DIAGNOSIS — D631 Anemia in chronic kidney disease: Secondary | ICD-10-CM | POA: Diagnosis not present

## 2020-10-16 DIAGNOSIS — N186 End stage renal disease: Secondary | ICD-10-CM | POA: Diagnosis not present

## 2020-10-16 DIAGNOSIS — N2581 Secondary hyperparathyroidism of renal origin: Secondary | ICD-10-CM | POA: Diagnosis not present

## 2020-10-16 DIAGNOSIS — Z992 Dependence on renal dialysis: Secondary | ICD-10-CM | POA: Diagnosis not present

## 2020-10-17 DIAGNOSIS — G4733 Obstructive sleep apnea (adult) (pediatric): Secondary | ICD-10-CM | POA: Diagnosis not present

## 2020-10-19 DIAGNOSIS — Z992 Dependence on renal dialysis: Secondary | ICD-10-CM | POA: Diagnosis not present

## 2020-10-19 DIAGNOSIS — N2581 Secondary hyperparathyroidism of renal origin: Secondary | ICD-10-CM | POA: Diagnosis not present

## 2020-10-19 DIAGNOSIS — D631 Anemia in chronic kidney disease: Secondary | ICD-10-CM | POA: Diagnosis not present

## 2020-10-19 DIAGNOSIS — N186 End stage renal disease: Secondary | ICD-10-CM | POA: Diagnosis not present

## 2020-10-21 DIAGNOSIS — Z7682 Awaiting organ transplant status: Secondary | ICD-10-CM | POA: Diagnosis not present

## 2020-10-21 DIAGNOSIS — Z992 Dependence on renal dialysis: Secondary | ICD-10-CM | POA: Diagnosis not present

## 2020-10-21 DIAGNOSIS — N186 End stage renal disease: Secondary | ICD-10-CM | POA: Diagnosis not present

## 2020-10-21 DIAGNOSIS — D631 Anemia in chronic kidney disease: Secondary | ICD-10-CM | POA: Diagnosis not present

## 2020-10-21 DIAGNOSIS — N2581 Secondary hyperparathyroidism of renal origin: Secondary | ICD-10-CM | POA: Diagnosis not present

## 2020-10-23 DIAGNOSIS — N2581 Secondary hyperparathyroidism of renal origin: Secondary | ICD-10-CM | POA: Diagnosis not present

## 2020-10-23 DIAGNOSIS — D631 Anemia in chronic kidney disease: Secondary | ICD-10-CM | POA: Diagnosis not present

## 2020-10-23 DIAGNOSIS — N186 End stage renal disease: Secondary | ICD-10-CM | POA: Diagnosis not present

## 2020-10-23 DIAGNOSIS — Z992 Dependence on renal dialysis: Secondary | ICD-10-CM | POA: Diagnosis not present

## 2020-10-23 NOTE — Telephone Encounter (Signed)
F/U   Message from Plan Your request has been approved  Johnnette Litter (Key: [id number]) Nurtec 75MG  dispersible tablets   Form [employer] Electronic PA Form (2017 NCPDP) Created 1 hour ago Sent to Plan 44 minutes ago Plan Response 44 minutes ago Submit Clinical Questions 25 minutes ago Determination Favorable 10 minutes ago

## 2020-10-23 NOTE — Telephone Encounter (Signed)
F/u   Pending Determination   [name] (Key: [id number]) Nurtec 75MG  dispersible tablets   Form [employer] Medicare Electronic PA Form (2017 NCPDP) Created 24 minutes ago Sent to Plan 21 minutes ago Plan Response 21 minutes ago Submit Clinical Questions 1 minute ago Wait for Determination Please wait for [employer] NCPDP 2017 to return a determination.

## 2020-10-26 DIAGNOSIS — D631 Anemia in chronic kidney disease: Secondary | ICD-10-CM | POA: Diagnosis not present

## 2020-10-26 DIAGNOSIS — Z23 Encounter for immunization: Secondary | ICD-10-CM | POA: Diagnosis not present

## 2020-10-26 DIAGNOSIS — N2581 Secondary hyperparathyroidism of renal origin: Secondary | ICD-10-CM | POA: Diagnosis not present

## 2020-10-26 DIAGNOSIS — R52 Pain, unspecified: Secondary | ICD-10-CM | POA: Diagnosis not present

## 2020-10-26 DIAGNOSIS — Z992 Dependence on renal dialysis: Secondary | ICD-10-CM | POA: Diagnosis not present

## 2020-10-26 DIAGNOSIS — N186 End stage renal disease: Secondary | ICD-10-CM | POA: Diagnosis not present

## 2020-10-27 DIAGNOSIS — G3184 Mild cognitive impairment, so stated: Secondary | ICD-10-CM | POA: Diagnosis not present

## 2020-10-27 DIAGNOSIS — N186 End stage renal disease: Secondary | ICD-10-CM | POA: Diagnosis not present

## 2020-10-27 DIAGNOSIS — Z8249 Family history of ischemic heart disease and other diseases of the circulatory system: Secondary | ICD-10-CM | POA: Diagnosis not present

## 2020-10-27 DIAGNOSIS — Z992 Dependence on renal dialysis: Secondary | ICD-10-CM | POA: Diagnosis not present

## 2020-10-27 DIAGNOSIS — Z6835 Body mass index (BMI) 35.0-35.9, adult: Secondary | ICD-10-CM | POA: Diagnosis not present

## 2020-10-27 DIAGNOSIS — M1990 Unspecified osteoarthritis, unspecified site: Secondary | ICD-10-CM | POA: Diagnosis not present

## 2020-10-27 DIAGNOSIS — K219 Gastro-esophageal reflux disease without esophagitis: Secondary | ICD-10-CM | POA: Diagnosis not present

## 2020-10-27 DIAGNOSIS — E785 Hyperlipidemia, unspecified: Secondary | ICD-10-CM | POA: Diagnosis not present

## 2020-10-27 DIAGNOSIS — R519 Headache, unspecified: Secondary | ICD-10-CM | POA: Diagnosis not present

## 2020-10-27 DIAGNOSIS — I132 Hypertensive heart and chronic kidney disease with heart failure and with stage 5 chronic kidney disease, or end stage renal disease: Secondary | ICD-10-CM | POA: Diagnosis not present

## 2020-10-27 DIAGNOSIS — G8929 Other chronic pain: Secondary | ICD-10-CM | POA: Diagnosis not present

## 2020-10-28 DIAGNOSIS — N186 End stage renal disease: Secondary | ICD-10-CM | POA: Diagnosis not present

## 2020-10-28 DIAGNOSIS — Z992 Dependence on renal dialysis: Secondary | ICD-10-CM | POA: Diagnosis not present

## 2020-10-28 DIAGNOSIS — R52 Pain, unspecified: Secondary | ICD-10-CM | POA: Diagnosis not present

## 2020-10-28 DIAGNOSIS — D631 Anemia in chronic kidney disease: Secondary | ICD-10-CM | POA: Diagnosis not present

## 2020-10-28 DIAGNOSIS — N2581 Secondary hyperparathyroidism of renal origin: Secondary | ICD-10-CM | POA: Diagnosis not present

## 2020-10-28 DIAGNOSIS — Z23 Encounter for immunization: Secondary | ICD-10-CM | POA: Diagnosis not present

## 2020-10-29 ENCOUNTER — Ambulatory Visit: Payer: Medicare Other

## 2020-10-30 DIAGNOSIS — N186 End stage renal disease: Secondary | ICD-10-CM | POA: Diagnosis not present

## 2020-10-30 DIAGNOSIS — N2581 Secondary hyperparathyroidism of renal origin: Secondary | ICD-10-CM | POA: Diagnosis not present

## 2020-10-30 DIAGNOSIS — D631 Anemia in chronic kidney disease: Secondary | ICD-10-CM | POA: Diagnosis not present

## 2020-10-30 DIAGNOSIS — Z992 Dependence on renal dialysis: Secondary | ICD-10-CM | POA: Diagnosis not present

## 2020-10-30 DIAGNOSIS — Z23 Encounter for immunization: Secondary | ICD-10-CM | POA: Diagnosis not present

## 2020-10-30 DIAGNOSIS — R52 Pain, unspecified: Secondary | ICD-10-CM | POA: Diagnosis not present

## 2020-11-02 DIAGNOSIS — Z992 Dependence on renal dialysis: Secondary | ICD-10-CM | POA: Diagnosis not present

## 2020-11-02 DIAGNOSIS — D631 Anemia in chronic kidney disease: Secondary | ICD-10-CM | POA: Diagnosis not present

## 2020-11-02 DIAGNOSIS — N2581 Secondary hyperparathyroidism of renal origin: Secondary | ICD-10-CM | POA: Diagnosis not present

## 2020-11-02 DIAGNOSIS — N186 End stage renal disease: Secondary | ICD-10-CM | POA: Diagnosis not present

## 2020-11-04 DIAGNOSIS — N186 End stage renal disease: Secondary | ICD-10-CM | POA: Diagnosis not present

## 2020-11-04 DIAGNOSIS — D631 Anemia in chronic kidney disease: Secondary | ICD-10-CM | POA: Diagnosis not present

## 2020-11-04 DIAGNOSIS — N2581 Secondary hyperparathyroidism of renal origin: Secondary | ICD-10-CM | POA: Diagnosis not present

## 2020-11-04 DIAGNOSIS — Z992 Dependence on renal dialysis: Secondary | ICD-10-CM | POA: Diagnosis not present

## 2020-11-06 DIAGNOSIS — N2581 Secondary hyperparathyroidism of renal origin: Secondary | ICD-10-CM | POA: Diagnosis not present

## 2020-11-06 DIAGNOSIS — D631 Anemia in chronic kidney disease: Secondary | ICD-10-CM | POA: Diagnosis not present

## 2020-11-06 DIAGNOSIS — N186 End stage renal disease: Secondary | ICD-10-CM | POA: Diagnosis not present

## 2020-11-06 DIAGNOSIS — Z992 Dependence on renal dialysis: Secondary | ICD-10-CM | POA: Diagnosis not present

## 2020-11-09 DIAGNOSIS — R52 Pain, unspecified: Secondary | ICD-10-CM | POA: Diagnosis not present

## 2020-11-09 DIAGNOSIS — N186 End stage renal disease: Secondary | ICD-10-CM | POA: Diagnosis not present

## 2020-11-09 DIAGNOSIS — N2581 Secondary hyperparathyroidism of renal origin: Secondary | ICD-10-CM | POA: Diagnosis not present

## 2020-11-09 DIAGNOSIS — D631 Anemia in chronic kidney disease: Secondary | ICD-10-CM | POA: Diagnosis not present

## 2020-11-09 DIAGNOSIS — Z992 Dependence on renal dialysis: Secondary | ICD-10-CM | POA: Diagnosis not present

## 2020-11-11 DIAGNOSIS — R52 Pain, unspecified: Secondary | ICD-10-CM | POA: Diagnosis not present

## 2020-11-11 DIAGNOSIS — N2581 Secondary hyperparathyroidism of renal origin: Secondary | ICD-10-CM | POA: Diagnosis not present

## 2020-11-11 DIAGNOSIS — D631 Anemia in chronic kidney disease: Secondary | ICD-10-CM | POA: Diagnosis not present

## 2020-11-11 DIAGNOSIS — N186 End stage renal disease: Secondary | ICD-10-CM | POA: Diagnosis not present

## 2020-11-11 DIAGNOSIS — Z992 Dependence on renal dialysis: Secondary | ICD-10-CM | POA: Diagnosis not present

## 2020-11-13 DIAGNOSIS — N2581 Secondary hyperparathyroidism of renal origin: Secondary | ICD-10-CM | POA: Diagnosis not present

## 2020-11-13 DIAGNOSIS — R52 Pain, unspecified: Secondary | ICD-10-CM | POA: Diagnosis not present

## 2020-11-13 DIAGNOSIS — N186 End stage renal disease: Secondary | ICD-10-CM | POA: Diagnosis not present

## 2020-11-13 DIAGNOSIS — D631 Anemia in chronic kidney disease: Secondary | ICD-10-CM | POA: Diagnosis not present

## 2020-11-13 DIAGNOSIS — Z992 Dependence on renal dialysis: Secondary | ICD-10-CM | POA: Diagnosis not present

## 2020-11-13 DIAGNOSIS — I129 Hypertensive chronic kidney disease with stage 1 through stage 4 chronic kidney disease, or unspecified chronic kidney disease: Secondary | ICD-10-CM | POA: Diagnosis not present

## 2020-11-16 DIAGNOSIS — Z992 Dependence on renal dialysis: Secondary | ICD-10-CM | POA: Diagnosis not present

## 2020-11-16 DIAGNOSIS — N186 End stage renal disease: Secondary | ICD-10-CM | POA: Diagnosis not present

## 2020-11-16 DIAGNOSIS — D631 Anemia in chronic kidney disease: Secondary | ICD-10-CM | POA: Diagnosis not present

## 2020-11-16 DIAGNOSIS — R52 Pain, unspecified: Secondary | ICD-10-CM | POA: Diagnosis not present

## 2020-11-16 DIAGNOSIS — N2581 Secondary hyperparathyroidism of renal origin: Secondary | ICD-10-CM | POA: Diagnosis not present

## 2020-11-18 DIAGNOSIS — R52 Pain, unspecified: Secondary | ICD-10-CM | POA: Diagnosis not present

## 2020-11-18 DIAGNOSIS — D631 Anemia in chronic kidney disease: Secondary | ICD-10-CM | POA: Diagnosis not present

## 2020-11-18 DIAGNOSIS — N2581 Secondary hyperparathyroidism of renal origin: Secondary | ICD-10-CM | POA: Diagnosis not present

## 2020-11-18 DIAGNOSIS — Z992 Dependence on renal dialysis: Secondary | ICD-10-CM | POA: Diagnosis not present

## 2020-11-18 DIAGNOSIS — N186 End stage renal disease: Secondary | ICD-10-CM | POA: Diagnosis not present

## 2020-11-20 DIAGNOSIS — N186 End stage renal disease: Secondary | ICD-10-CM | POA: Diagnosis not present

## 2020-11-20 DIAGNOSIS — R52 Pain, unspecified: Secondary | ICD-10-CM | POA: Diagnosis not present

## 2020-11-20 DIAGNOSIS — Z1152 Encounter for screening for COVID-19: Secondary | ICD-10-CM | POA: Diagnosis not present

## 2020-11-20 DIAGNOSIS — Z992 Dependence on renal dialysis: Secondary | ICD-10-CM | POA: Diagnosis not present

## 2020-11-20 DIAGNOSIS — D631 Anemia in chronic kidney disease: Secondary | ICD-10-CM | POA: Diagnosis not present

## 2020-11-20 DIAGNOSIS — N2581 Secondary hyperparathyroidism of renal origin: Secondary | ICD-10-CM | POA: Diagnosis not present

## 2020-11-23 DIAGNOSIS — N2581 Secondary hyperparathyroidism of renal origin: Secondary | ICD-10-CM | POA: Diagnosis not present

## 2020-11-23 DIAGNOSIS — N186 End stage renal disease: Secondary | ICD-10-CM | POA: Diagnosis not present

## 2020-11-23 DIAGNOSIS — Z1152 Encounter for screening for COVID-19: Secondary | ICD-10-CM | POA: Diagnosis not present

## 2020-11-23 DIAGNOSIS — Z992 Dependence on renal dialysis: Secondary | ICD-10-CM | POA: Diagnosis not present

## 2020-11-25 DIAGNOSIS — N186 End stage renal disease: Secondary | ICD-10-CM | POA: Diagnosis not present

## 2020-11-25 DIAGNOSIS — N2581 Secondary hyperparathyroidism of renal origin: Secondary | ICD-10-CM | POA: Diagnosis not present

## 2020-11-25 DIAGNOSIS — Z992 Dependence on renal dialysis: Secondary | ICD-10-CM | POA: Diagnosis not present
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
## 2020-11-26 ENCOUNTER — Ambulatory Visit: Payer: Medicare Other | Admitting: Neurology

## 2020-11-27 DIAGNOSIS — Z992 Dependence on renal dialysis: Secondary | ICD-10-CM | POA: Diagnosis not present

## 2020-11-27 DIAGNOSIS — N2581 Secondary hyperparathyroidism of renal origin: Secondary | ICD-10-CM | POA: Diagnosis not present

## 2020-11-27 DIAGNOSIS — N186 End stage renal disease: Secondary | ICD-10-CM | POA: Diagnosis not present

## 2020-11-30 DIAGNOSIS — D631 Anemia in chronic kidney disease: Secondary | ICD-10-CM | POA: Diagnosis not present

## 2020-11-30 DIAGNOSIS — Z992 Dependence on renal dialysis: Secondary | ICD-10-CM | POA: Diagnosis not present

## 2020-11-30 DIAGNOSIS — N2581 Secondary hyperparathyroidism of renal origin: Secondary | ICD-10-CM | POA: Diagnosis not present

## 2020-11-30 DIAGNOSIS — N186 End stage renal disease: Secondary | ICD-10-CM | POA: Diagnosis not present

## 2020-12-02 DIAGNOSIS — N2581 Secondary hyperparathyroidism of renal origin: Secondary | ICD-10-CM | POA: Diagnosis not present

## 2020-12-02 DIAGNOSIS — D631 Anemia in chronic kidney disease: Secondary | ICD-10-CM | POA: Diagnosis not present

## 2020-12-02 DIAGNOSIS — Z992 Dependence on renal dialysis: Secondary | ICD-10-CM | POA: Diagnosis not present

## 2020-12-02 DIAGNOSIS — N186 End stage renal disease: Secondary | ICD-10-CM | POA: Diagnosis not present

## 2020-12-04 DIAGNOSIS — D631 Anemia in chronic kidney disease: Secondary | ICD-10-CM | POA: Diagnosis not present

## 2020-12-04 DIAGNOSIS — Z992 Dependence on renal dialysis: Secondary | ICD-10-CM | POA: Diagnosis not present

## 2020-12-04 DIAGNOSIS — N2581 Secondary hyperparathyroidism of renal origin: Secondary | ICD-10-CM | POA: Diagnosis not present

## 2020-12-04 DIAGNOSIS — N186 End stage renal disease: Secondary | ICD-10-CM | POA: Diagnosis not present

## 2020-12-07 DIAGNOSIS — Z992 Dependence on renal dialysis: Secondary | ICD-10-CM | POA: Diagnosis not present

## 2020-12-07 DIAGNOSIS — N2581 Secondary hyperparathyroidism of renal origin: Secondary | ICD-10-CM | POA: Diagnosis not present

## 2020-12-07 DIAGNOSIS — N186 End stage renal disease: Secondary | ICD-10-CM | POA: Diagnosis not present

## 2020-12-09 DIAGNOSIS — N186 End stage renal disease: Secondary | ICD-10-CM | POA: Diagnosis not present

## 2020-12-09 DIAGNOSIS — Z992 Dependence on renal dialysis: Secondary | ICD-10-CM | POA: Diagnosis not present

## 2020-12-09 DIAGNOSIS — N2581 Secondary hyperparathyroidism of renal origin: Secondary | ICD-10-CM | POA: Diagnosis not present

## 2020-12-10 DIAGNOSIS — H5200 Hypermetropia, unspecified eye: Secondary | ICD-10-CM | POA: Diagnosis not present

## 2020-12-10 DIAGNOSIS — E7800 Pure hypercholesterolemia, unspecified: Secondary | ICD-10-CM | POA: Diagnosis not present

## 2020-12-10 DIAGNOSIS — I10 Essential (primary) hypertension: Secondary | ICD-10-CM | POA: Diagnosis not present

## 2020-12-10 DIAGNOSIS — Z0100 Encounter for examination of eyes and vision without abnormal findings: Secondary | ICD-10-CM | POA: Diagnosis not present

## 2020-12-11 DIAGNOSIS — N186 End stage renal disease: Secondary | ICD-10-CM | POA: Diagnosis not present

## 2020-12-11 DIAGNOSIS — N2581 Secondary hyperparathyroidism of renal origin: Secondary | ICD-10-CM | POA: Diagnosis not present

## 2020-12-11 DIAGNOSIS — Z992 Dependence on renal dialysis: Secondary | ICD-10-CM | POA: Diagnosis not present

## 2020-12-14 DIAGNOSIS — Z992 Dependence on renal dialysis: Secondary | ICD-10-CM | POA: Diagnosis not present

## 2020-12-14 DIAGNOSIS — N2581 Secondary hyperparathyroidism of renal origin: Secondary | ICD-10-CM | POA: Diagnosis not present

## 2020-12-14 DIAGNOSIS — I129 Hypertensive chronic kidney disease with stage 1 through stage 4 chronic kidney disease, or unspecified chronic kidney disease: Secondary | ICD-10-CM | POA: Diagnosis not present

## 2020-12-14 DIAGNOSIS — N186 End stage renal disease: Secondary | ICD-10-CM | POA: Diagnosis not present

## 2020-12-16 DIAGNOSIS — N2581 Secondary hyperparathyroidism of renal origin: Secondary | ICD-10-CM | POA: Diagnosis not present

## 2020-12-16 DIAGNOSIS — N186 End stage renal disease: Secondary | ICD-10-CM | POA: Diagnosis not present

## 2020-12-16 DIAGNOSIS — D631 Anemia in chronic kidney disease: Secondary | ICD-10-CM | POA: Diagnosis not present

## 2020-12-16 DIAGNOSIS — Z992 Dependence on renal dialysis: Secondary | ICD-10-CM | POA: Diagnosis not present

## 2020-12-17 DIAGNOSIS — G4733 Obstructive sleep apnea (adult) (pediatric): Secondary | ICD-10-CM | POA: Diagnosis not present

## 2020-12-18 DIAGNOSIS — N2581 Secondary hyperparathyroidism of renal origin: Secondary | ICD-10-CM | POA: Diagnosis not present

## 2020-12-18 DIAGNOSIS — D631 Anemia in chronic kidney disease: Secondary | ICD-10-CM | POA: Diagnosis not present

## 2020-12-18 DIAGNOSIS — N186 End stage renal disease: Secondary | ICD-10-CM | POA: Diagnosis not present

## 2020-12-18 DIAGNOSIS — Z992 Dependence on renal dialysis: Secondary | ICD-10-CM | POA: Diagnosis not present

## 2020-12-21 DIAGNOSIS — N186 End stage renal disease: Secondary | ICD-10-CM | POA: Diagnosis not present

## 2020-12-21 DIAGNOSIS — N2581 Secondary hyperparathyroidism of renal origin: Secondary | ICD-10-CM | POA: Diagnosis not present

## 2020-12-21 DIAGNOSIS — Z992 Dependence on renal dialysis: Secondary | ICD-10-CM | POA: Diagnosis not present

## 2020-12-23 DIAGNOSIS — Z992 Dependence on renal dialysis: Secondary | ICD-10-CM | POA: Diagnosis not present

## 2020-12-23 DIAGNOSIS — N2581 Secondary hyperparathyroidism of renal origin: Secondary | ICD-10-CM | POA: Diagnosis not present

## 2020-12-23 DIAGNOSIS — N186 End stage renal disease: Secondary | ICD-10-CM | POA: Diagnosis not present

## 2020-12-25 DIAGNOSIS — N2581 Secondary hyperparathyroidism of renal origin: Secondary | ICD-10-CM | POA: Diagnosis not present

## 2020-12-25 DIAGNOSIS — Z992 Dependence on renal dialysis: Secondary | ICD-10-CM | POA: Diagnosis not present

## 2020-12-25 DIAGNOSIS — N186 End stage renal disease: Secondary | ICD-10-CM | POA: Diagnosis not present

## 2020-12-28 DIAGNOSIS — Z992 Dependence on renal dialysis: Secondary | ICD-10-CM | POA: Diagnosis not present

## 2020-12-28 DIAGNOSIS — D509 Iron deficiency anemia, unspecified: Secondary | ICD-10-CM | POA: Diagnosis not present

## 2020-12-28 DIAGNOSIS — N186 End stage renal disease: Secondary | ICD-10-CM | POA: Diagnosis not present

## 2020-12-28 DIAGNOSIS — N2581 Secondary hyperparathyroidism of renal origin: Secondary | ICD-10-CM | POA: Diagnosis not present

## 2020-12-28 DIAGNOSIS — D631 Anemia in chronic kidney disease: Secondary | ICD-10-CM | POA: Diagnosis not present

## 2020-12-30 DIAGNOSIS — N2581 Secondary hyperparathyroidism of renal origin: Secondary | ICD-10-CM | POA: Diagnosis not present

## 2020-12-30 DIAGNOSIS — D509 Iron deficiency anemia, unspecified: Secondary | ICD-10-CM | POA: Diagnosis not present

## 2020-12-30 DIAGNOSIS — D631 Anemia in chronic kidney disease: Secondary | ICD-10-CM | POA: Diagnosis not present

## 2020-12-30 DIAGNOSIS — Z992 Dependence on renal dialysis: Secondary | ICD-10-CM | POA: Diagnosis not present

## 2020-12-30 DIAGNOSIS — N186 End stage renal disease: Secondary | ICD-10-CM | POA: Diagnosis not present

## 2021-01-01 DIAGNOSIS — N186 End stage renal disease: Secondary | ICD-10-CM | POA: Diagnosis not present

## 2021-01-01 DIAGNOSIS — D509 Iron deficiency anemia, unspecified: Secondary | ICD-10-CM | POA: Diagnosis not present

## 2021-01-01 DIAGNOSIS — D631 Anemia in chronic kidney disease: Secondary | ICD-10-CM | POA: Diagnosis not present

## 2021-01-01 DIAGNOSIS — Z992 Dependence on renal dialysis: Secondary | ICD-10-CM | POA: Diagnosis not present

## 2021-01-01 DIAGNOSIS — N2581 Secondary hyperparathyroidism of renal origin: Secondary | ICD-10-CM | POA: Diagnosis not present

## 2021-01-04 DIAGNOSIS — N2581 Secondary hyperparathyroidism of renal origin: Secondary | ICD-10-CM | POA: Diagnosis not present

## 2021-01-04 DIAGNOSIS — D509 Iron deficiency anemia, unspecified: Secondary | ICD-10-CM | POA: Diagnosis not present

## 2021-01-04 DIAGNOSIS — N186 End stage renal disease: Secondary | ICD-10-CM | POA: Diagnosis not present

## 2021-01-04 DIAGNOSIS — Z992 Dependence on renal dialysis: Secondary | ICD-10-CM | POA: Diagnosis not present

## 2021-01-06 DIAGNOSIS — N2581 Secondary hyperparathyroidism of renal origin: Secondary | ICD-10-CM | POA: Diagnosis not present

## 2021-01-06 DIAGNOSIS — N186 End stage renal disease: Secondary | ICD-10-CM | POA: Diagnosis not present

## 2021-01-06 DIAGNOSIS — Z992 Dependence on renal dialysis: Secondary | ICD-10-CM | POA: Diagnosis not present

## 2021-01-06 DIAGNOSIS — D509 Iron deficiency anemia, unspecified: Secondary | ICD-10-CM | POA: Diagnosis not present

## 2021-01-09 DIAGNOSIS — N186 End stage renal disease: Secondary | ICD-10-CM | POA: Diagnosis not present

## 2021-01-09 DIAGNOSIS — N2581 Secondary hyperparathyroidism of renal origin: Secondary | ICD-10-CM | POA: Diagnosis not present

## 2021-01-09 DIAGNOSIS — Z992 Dependence on renal dialysis: Secondary | ICD-10-CM | POA: Diagnosis not present

## 2021-01-09 DIAGNOSIS — D509 Iron deficiency anemia, unspecified: Secondary | ICD-10-CM | POA: Diagnosis not present

## 2021-01-10 ENCOUNTER — Encounter (HOSPITAL_COMMUNITY): Payer: Self-pay | Admitting: Emergency Medicine

## 2021-01-10 ENCOUNTER — Emergency Department (HOSPITAL_COMMUNITY)
Admission: EM | Admit: 2021-01-10 | Discharge: 2021-01-10 | Disposition: A | Discharge status: Home / Self Care | Payer: Medicare HMO | Attending: Emergency Medicine | Admitting: Emergency Medicine

## 2021-01-10 ENCOUNTER — Encounter (HOSPITAL_COMMUNITY): Payer: Self-pay

## 2021-01-10 ENCOUNTER — Other Ambulatory Visit: Payer: Self-pay

## 2021-01-10 DIAGNOSIS — J101 Influenza due to other identified influenza virus with other respiratory manifestations: Secondary | ICD-10-CM | POA: Diagnosis not present

## 2021-01-10 DIAGNOSIS — I5022 Chronic systolic (congestive) heart failure: Secondary | ICD-10-CM | POA: Diagnosis not present

## 2021-01-10 DIAGNOSIS — I132 Hypertensive heart and chronic kidney disease with heart failure and with stage 5 chronic kidney disease, or end stage renal disease: Secondary | ICD-10-CM | POA: Insufficient documentation

## 2021-01-10 DIAGNOSIS — J111 Influenza due to unidentified influenza virus with other respiratory manifestations: Secondary | ICD-10-CM | POA: Diagnosis not present

## 2021-01-10 DIAGNOSIS — Z20822 Contact with and (suspected) exposure to covid-19: Secondary | ICD-10-CM | POA: Insufficient documentation

## 2021-01-10 DIAGNOSIS — Z8616 Personal history of COVID-19: Secondary | ICD-10-CM | POA: Insufficient documentation

## 2021-01-10 DIAGNOSIS — N186 End stage renal disease: Secondary | ICD-10-CM | POA: Insufficient documentation

## 2021-01-10 DIAGNOSIS — R509 Fever, unspecified: Secondary | ICD-10-CM | POA: Diagnosis not present

## 2021-01-10 DIAGNOSIS — Z79899 Other long term (current) drug therapy: Secondary | ICD-10-CM | POA: Diagnosis not present

## 2021-01-10 DIAGNOSIS — Z992 Dependence on renal dialysis: Secondary | ICD-10-CM | POA: Diagnosis not present

## 2021-01-10 LAB — RESP PANEL BY RT-PCR (FLU A&B, COVID) ARPGX2
Influenza A by PCR: POSITIVE — AB
Influenza B by PCR: NEGATIVE
SARS Coronavirus 2 by RT PCR: NEGATIVE

## 2021-01-10 MED ORDER — ONDANSETRON HCL 4 MG PO TABS: 4.0000 mg | ORAL_TABLET | Freq: Three times a day (TID) | ORAL | 0 refills | Status: DC | PRN

## 2021-01-10 MED ORDER — ACETAMINOPHEN 325 MG PO TABS
650.0000 mg | ORAL_TABLET | Freq: Once | ORAL | Status: AC | PRN
  Administered 2021-01-10: 10:00:00 650 mg via ORAL
  Filled 2021-01-10: qty 2

## 2021-01-10 MED ORDER — ONDANSETRON 4 MG PO TBDP: 4.0000 mg | ORAL_TABLET | Freq: Once | ORAL | Status: DC

## 2021-01-10 NOTE — ED Provider Notes (Signed)
[facility] EMERGENCY DEPARTMENT Provider Note   CSN: [id number] Arrival date & time: 01/10/21  5643     History Chief Complaint  Patient presents with   Cough   Nasal Congestion    [name] is a 55 y.o. male with end-stage renal disease on dialysis Monday Wednesday Friday presented emerge apartment with shortness of breath, fever, malaise.  He reports onset of symptoms 3 days ago on Friday.  He reports his wife and his daughter whom he lives with are both sick as well.  They have had fevers, cough, congestion, nausea, poor appetite.  He did go to dialysis as scheduled on Saturday, yesterday.  His neck session is due on Monday.  There was some small ulceration due to the Thanksgiving holiday, but normally goes Monday Wednesday Friday.  He separately reports to me that he has been having problems for 2 years now after dialysis feeling fatigued with a headache during the last hour of dialysis.  He says he has spoken to his nephrologist but "they won't do anything about it."  HPI     Past Medical History:  Diagnosis Date   Anemia    Back pain    CKD (chronic kidney disease), stage III (HCC)    Stage 4   Colon polyps    adenomatous   Elevated cholesterol    History of echocardiogram    Echo 11/17: EF 65-70, normal wall motion, grade 1 diastolic dysfunction, trivial MR, mild LAE, mild TR   Hyperlipidemia    Hypertension    no current bp meds for last 3 months   Kidney stones 2007   Medication intolerance    a. multiple with prior nonadherence to regimen.   Nausea    " in the morning since having the kidney problem "   Otosclerosis of both ears    Pneumonia    PONV (postoperative nausea and vomiting)    PUD (peptic ulcer disease)    Has had unspecified surgery for this   Sleep apnea     Patient Active Problem List   Diagnosis Date Noted   Other headache syndrome 07/07/2020   Allergy, unspecified, sequela 01/27/2020   Personal history of anaphylaxis  01/27/2020   Encounter for screening for COVID-19 10/31/2019   History of nephrectomy, right 07/23/2019   Low left ventricular ejection fraction 07/23/2019   Obesity (BMI 30-39.9) 07/23/2019   Pre-transplant evaluation for kidney transplant 07/23/2019   Hypertensive heart disease with chronic systolic congestive heart failure ([place]) 07/01/2019   Iron deficiency anemia, unspecified 04/25/2019   Encounter for immunization 04/22/2019   Anemia in chronic kidney disease 04/17/2019   ESRD (end stage renal disease) on dialysis ([place]) 04/17/2019   Pain, unspecified 04/17/2019   Encounter for screening for respiratory tuberculosis 04/17/2019   Pruritus, unspecified 04/17/2019   Secondary hyperparathyroidism of renal origin ([place]) 04/17/2019   DCM (dilated cardiomyopathy) ([place])    CKD (chronic kidney disease) stage 5, GFR less than 15 ml/min ([place]) 04/11/2019   History of colon polyps 03/20/2019   Gastroesophageal reflux disease 03/20/2019   CKD (chronic kidney disease) stage 4, GFR 15-29 ml/min (HCC) 08/25/2018   CKD (chronic kidney disease) stage 3, GFR 30-59 ml/min (HCC) 05/10/2017   Renal insufficiency 11/15/2016   Testicular hypofunction 09/15/2015   BPH without obstruction/lower urinary tract symptoms 09/15/2015   Bilateral hearing loss 07/03/2015   Mixed hearing loss of right ear 07/03/2015   Hyperlipidemia 03/29/2013   Nephrolithiasis 03/14/2013   Lumbar radiculopathy 02/26/2013  Vitamin D deficiency 08/03/2011   HLD (hyperlipidemia) 04/10/2010    Past Surgical History:  Procedure Laterality Date   AV FISTULA PLACEMENT Left 01/04/2019   Procedure: ARTERIOVENOUS (AV) FISTULA CREATION LEFT ARM;  Surgeon: [name], MD;  Location: [place];  Service: Vascular;  Laterality: Left;   [place] Left 03/11/2019   Procedure: BASILIC VEIN TRANSPOSITION 2ND STAGE LEFT;  Surgeon: [name], MD;  Location: [place];  Service: Vascular;  Laterality: Left;    NEPHRECTOMY  02/18/2011   Procedure: NEPHRECTOMY;  Surgeon: Hanley Ben, MD;  Location: [facility];  Service: Urology;  Laterality: Right;   SMALL INTESTINE SURGERY     STAPEDOTOMY  2005   lt ear jan, right ear sept   surgery for ulcers  1990       Family History  Problem Relation Age of Onset   Hyperlipidemia Father    Heart disease Mother    Hypertension Mother    Hypertension Sister    Heart disease Brother 70       CABG   Hyperlipidemia Sister    Hyperlipidemia Sister    Esophageal cancer Cousin    Healthy Child    Colon cancer Neg Hx    Stomach cancer Neg Hx    Rectal cancer Neg Hx     Social History   Tobacco Use   Smoking status: Never   Smokeless tobacco: Never  Vaping Use   Vaping Use: Never used  Substance Use Topics   Alcohol use: No   Drug use: No    Home Medications Prior to Admission medications   Medication Sig Start Date End Date Taking? Authorizing Provider  ondansetron (ZOFRAN) 4 MG tablet Take 1 tablet (4 mg total) by mouth every 8 (eight) hours as needed for up to 15 doses for nausea or vomiting. 01/10/21  Yes Wyvonnia Dusky, MD  amLODipine (NORVASC) 5 MG tablet Take 1 tablet (5 mg total) by mouth daily. 04/18/19 09/15/20  Alma Friendly, MD  B Complex-C-Zn-Folic Acid (DIALYVITE 700 WITH ZINC) 0.8 MG TABS Take 1 tablet by mouth daily. 06/22/20   [provider]  benzoyl peroxide (BENZOYL PEROXIDE) 5 % external liquid Apply topically 2 (two) times daily. 12/30/19   Horald Pollen, MD  Betamethasone Sodium Phosphate 6 MG/ML SOLN INJECT 2:4 SYRINGE BILATERALLY INTO [facility] KNEE JOINT 06/21/19   [provider]  calcium acetate (PHOSLO) 667 MG capsule Take 2 capsules (1,334 mg total) by mouth 3 (three) times daily with meals. Patient not taking: Reported on 09/15/2020 12/30/19   Horald Pollen, MD  carvedilol (COREG) 25 MG tablet Take 1 tablet (25 mg total) by mouth 2 (two) times daily with a meal. 04/17/19 09/15/20  Alma Friendly, MD  hydrALAZINE (APRESOLINE) 25 MG tablet Take 1 tablet (25 mg total) by mouth every 8 (eight) hours. 04/17/19 09/15/20  Alma Friendly, MD  Ibuprofen & Acetaminophen 600 & 500 MG TBPK Take 1 tablet by mouth every 8 (eight) hours as needed. Patient not taking: Reported on 09/15/2020 07/07/20   Horald Pollen, MD  icosapent Ethyl (VASCEPA) 1 g capsule Take 2 capsules (2 g total) by mouth 2 (two) times daily. 10/13/20   Hilty, Nadean Corwin, MD  lidocaine-prilocaine (EMLA) cream Apply topically. 06/11/19   [provider]  Methoxy PEG-Epoetin Beta (MIRCERA IJ) Mircera 11/12/19 05/05/21  [provider]  pantoprazole (PROTONIX) 40 MG tablet Take 1 tablet (40 mg total) by mouth daily. Patient not  taking: Reported on 09/15/2020 04/17/19   Alma Friendly, MD  Rimegepant Sulfate (NURTEC) 75 MG TBDP Take 75 mg by mouth as needed. Maxium 1 tab in 24 hours Patient not taking: Reported on 09/15/2020 06/18/20   Pieter Partridge, DO    Allergies    Lokelma [sodium zirconium cyclosilicate], Beef-derived products, Pegademase bovine, Poractant alfa, and Pork-derived products  Review of Systems   Review of Systems  Constitutional:  Positive for chills, fatigue and fever.  HENT:  Negative for ear pain and sore throat.   Eyes:  Negative for pain and visual disturbance.  Respiratory:  Positive for cough and shortness of breath.   Cardiovascular:  Negative for chest pain and palpitations.  Gastrointestinal:  Positive for abdominal pain and nausea.  Genitourinary:  Negative for dysuria and hematuria.  Musculoskeletal:  Positive for arthralgias and myalgias.  Skin:  Negative for color change and rash.  Neurological:  Positive for headaches. Negative for syncope.  All other systems reviewed and are negative.  Physical Exam Updated Vital Signs BP (!) 156/99 (BP Location: Right Arm)   Pulse (!) 102   Temp (!) 100.9 F (38.3 C) (Oral)   Resp 18   SpO2 94%   Physical  Exam Constitutional:      General: He is not in acute distress. HENT:     Head: Normocephalic and atraumatic.  Eyes:     Conjunctiva/sclera: Conjunctivae normal.     Pupils: Pupils are equal, round, and reactive to light.  Cardiovascular:     Rate and Rhythm: Normal rate and regular rhythm.  Pulmonary:     Effort: Pulmonary effort is normal. No respiratory distress.  Abdominal:     General: There is no distension.     Tenderness: There is no abdominal tenderness.  Skin:    General: Skin is warm and dry.  Neurological:     General: No focal deficit present.     Mental Status: He is alert. Mental status is at baseline.  Psychiatric:        Mood and Affect: Mood normal.        Behavior: Behavior normal.    ED Results / Procedures / Treatments   Labs (all labs ordered are listed, but only abnormal results are displayed) Labs Reviewed  RESP PANEL BY RT-PCR (FLU A&B, COVID) ARPGX2 - Abnormal; Notable for the following components:      Result Value   Influenza A by PCR POSITIVE (*)    All other components within normal limits    EKG None  Radiology No results found.  Procedures Procedures   Medications Ordered in ED Medications  ondansetron (ZOFRAN-ODT) disintegrating tablet 4 mg (has no administration in time range)  acetaminophen (TYLENOL) tablet 650 mg (650 mg Oral Given 01/10/21 0959)    ED Course  I have reviewed the triage vital signs and the nursing notes.  Pertinent labs & imaging results that were available during my care of the patient were reviewed by me and considered in my medical decision making (see chart for details).  Patient is here with flulike symptoms.  Influenza test is positive.  Multiple sick family contacts in the house with similar symptoms.  This is all highly consistent with influenza infection.  He is now on day 3 out of 5.  He is outside the window of Tamiflu.  I can offer him some Zofran for his nausea, otherwise he is using  over-the-counter medications for cold and flu, which are reasonable to continue.  I explained that his symptoms should improve over the next 2 to 3 days, and he needs to stay hydrated at home.  I made it clear that he should not miss dialysis for this, but should inform them over the phone that he is positive for flu, and wear a mask to his next session.  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  I explained to him that this is extremely common in dialysis patients, and that this is an issue that should be addressed by his nephrologist.  Otherwise, aside from fever, his vital signs appear stable.  He is not hypoxic.  He is 99% on room air.  I do not see evidence of significant dehydration or respiratory distress.  I do think is reasonably safe for discharge home.    Final Clinical Impression(s) / ED Diagnoses Final diagnoses:  Influenza    Rx / DC Orders ED Discharge Orders          Ordered    ondansetron (ZOFRAN) 4 MG tablet  Every 8 hours PRN        01/10/21 1226             Wyvonnia Dusky, MD 01/10/21 1230

## 2021-01-10 NOTE — ED Triage Notes (Signed)
Pt reports productive cough with yellowish-white phlegm, headache, eye pain, ear pain, congestion, and fever since Friday.  Last dialysis yesterday.

## 2021-01-10 NOTE — Discharge Instructions (Signed)
You were diagnosed with influenza, or the flu today.  You should quarantine at home for the next 7 days until Friday to prevent spreading this to other people.  However, the exception is dialysis.  It is very important that you go back to dialysis as scheduled, and that you tell them on the phone that you tested positive.  You should wear a mask at dialysis, and they should isolate you from other patients.  You can take over-the-counter medications for cough, cold and flu symptoms.  Most people have symptoms for 5 days and then begin to feel better.  Keep drinking plenty of water to stay hydrated.  I prescribed a medicine called Zofran which is a nausea medicine.

## 2021-01-10 NOTE — ED Provider Notes (Signed)
Emergency Medicine Provider Triage Evaluation Note  [name] , a 55 y.o. male  was evaluated in triage.  Pt complains of cold sxs.  Review of Systems  Positive: Cough, headache, congestion, fever, body aches Negative: N/v/d  Physical Exam  BP (!) 156/99 (BP Location: Right Arm)   Pulse (!) 102   Temp (!) 100.9 F (38.3 C) (Oral)   Resp 18   SpO2 94%  Gen:   Awake, no distress   Resp:  Normal effort  MSK:   Moves extremities without difficulty  Other:    Medical Decision Making  Medically screening exam initiated at 9:56 AM.  Appropriate orders placed.  [name] was informed that the remainder of the evaluation will be completed by another provider, this initial triage assessment does not replace that evaluation, and the importance of remaining in the ED until their evaluation is complete.  Dialysis pt here with cold sxs x 3 days after dialysis session.  Had covid several months prior and this felt similar.    Andrew Moras, PA-C 01/10/21 8250    Andrew Pick, MD 01/12/21 [phone]

## 2021-01-11 DIAGNOSIS — N2581 Secondary hyperparathyroidism of renal origin: Secondary | ICD-10-CM | POA: Diagnosis not present

## 2021-01-11 DIAGNOSIS — Z992 Dependence on renal dialysis: Secondary | ICD-10-CM | POA: Diagnosis not present

## 2021-01-11 DIAGNOSIS — N186 End stage renal disease: Secondary | ICD-10-CM | POA: Diagnosis not present

## 2021-01-13 DIAGNOSIS — I129 Hypertensive chronic kidney disease with stage 1 through stage 4 chronic kidney disease, or unspecified chronic kidney disease: Secondary | ICD-10-CM | POA: Diagnosis not present

## 2021-01-13 DIAGNOSIS — N2581 Secondary hyperparathyroidism of renal origin: Secondary | ICD-10-CM | POA: Diagnosis not present

## 2021-01-13 DIAGNOSIS — N186 End stage renal disease: Secondary | ICD-10-CM | POA: Diagnosis not present

## 2021-01-13 DIAGNOSIS — Z992 Dependence on renal dialysis: Secondary | ICD-10-CM | POA: Diagnosis not present

## 2021-01-15 DIAGNOSIS — D509 Iron deficiency anemia, unspecified: Secondary | ICD-10-CM | POA: Diagnosis not present

## 2021-01-15 DIAGNOSIS — N186 End stage renal disease: Secondary | ICD-10-CM | POA: Diagnosis not present

## 2021-01-15 DIAGNOSIS — N2581 Secondary hyperparathyroidism of renal origin: Secondary | ICD-10-CM | POA: Diagnosis not present

## 2021-01-15 DIAGNOSIS — Z992 Dependence on renal dialysis: Secondary | ICD-10-CM | POA: Diagnosis not present

## 2021-01-15 DIAGNOSIS — D631 Anemia in chronic kidney disease: Secondary | ICD-10-CM | POA: Diagnosis not present

## 2021-01-18 DIAGNOSIS — Z992 Dependence on renal dialysis: Secondary | ICD-10-CM | POA: Diagnosis not present

## 2021-01-18 DIAGNOSIS — N2581 Secondary hyperparathyroidism of renal origin: Secondary | ICD-10-CM | POA: Diagnosis not present

## 2021-01-18 DIAGNOSIS — D509 Iron deficiency anemia, unspecified: Secondary | ICD-10-CM | POA: Diagnosis not present

## 2021-01-18 DIAGNOSIS — N186 End stage renal disease: Secondary | ICD-10-CM | POA: Diagnosis not present

## 2021-01-19 DIAGNOSIS — Z905 Acquired absence of kidney: Secondary | ICD-10-CM | POA: Diagnosis not present

## 2021-01-19 DIAGNOSIS — R69 Illness, unspecified: Secondary | ICD-10-CM | POA: Diagnosis not present

## 2021-01-19 DIAGNOSIS — Z7682 Awaiting organ transplant status: Secondary | ICD-10-CM | POA: Diagnosis not present

## 2021-01-19 DIAGNOSIS — Z992 Dependence on renal dialysis: Secondary | ICD-10-CM | POA: Diagnosis not present

## 2021-01-19 DIAGNOSIS — Z114 Encounter for screening for human immunodeficiency virus [HIV]: Secondary | ICD-10-CM | POA: Diagnosis not present

## 2021-01-19 DIAGNOSIS — Z1159 Encounter for screening for other viral diseases: Secondary | ICD-10-CM | POA: Diagnosis not present

## 2021-01-19 DIAGNOSIS — N186 End stage renal disease: Secondary | ICD-10-CM | POA: Diagnosis not present

## 2021-01-19 DIAGNOSIS — I10 Essential (primary) hypertension: Secondary | ICD-10-CM | POA: Diagnosis not present

## 2021-01-20 DIAGNOSIS — N186 End stage renal disease: Secondary | ICD-10-CM | POA: Diagnosis not present

## 2021-01-20 DIAGNOSIS — N2581 Secondary hyperparathyroidism of renal origin: Secondary | ICD-10-CM | POA: Diagnosis not present

## 2021-01-20 DIAGNOSIS — D509 Iron deficiency anemia, unspecified: Secondary | ICD-10-CM | POA: Diagnosis not present

## 2021-01-20 DIAGNOSIS — Z992 Dependence on renal dialysis: Secondary | ICD-10-CM | POA: Diagnosis not present

## 2021-01-22 DIAGNOSIS — Z992 Dependence on renal dialysis: Secondary | ICD-10-CM | POA: Diagnosis not present

## 2021-01-22 DIAGNOSIS — N186 End stage renal disease: Secondary | ICD-10-CM | POA: Diagnosis not present

## 2021-01-22 DIAGNOSIS — N2581 Secondary hyperparathyroidism of renal origin: Secondary | ICD-10-CM | POA: Diagnosis not present

## 2021-01-22 DIAGNOSIS — D509 Iron deficiency anemia, unspecified: Secondary | ICD-10-CM | POA: Diagnosis not present

## 2021-01-24 IMAGING — DX DG CHEST 2V
2 series · 2 of 2 positions shown · non-contrast
Comparison: October 16, 2018

CLINICAL DATA: Cough.

EXAM:
CHEST - 2 VIEW

[chest pa]
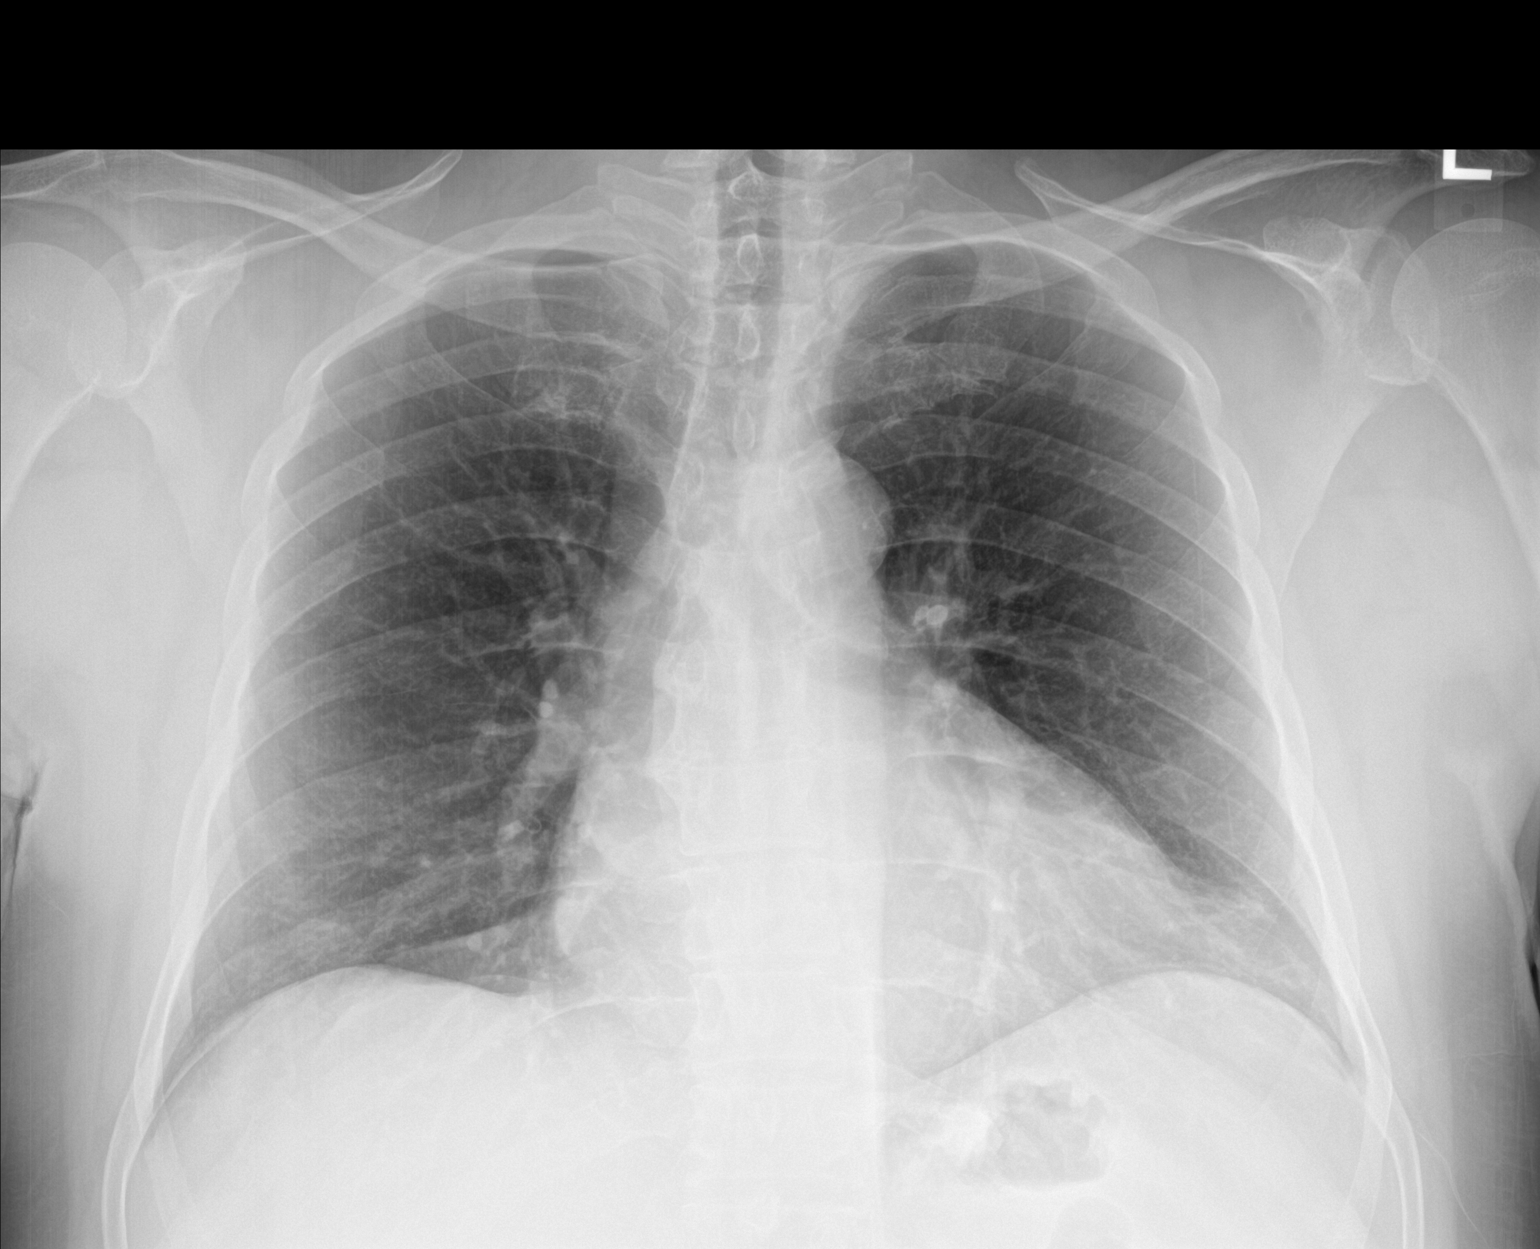

[chest lat]
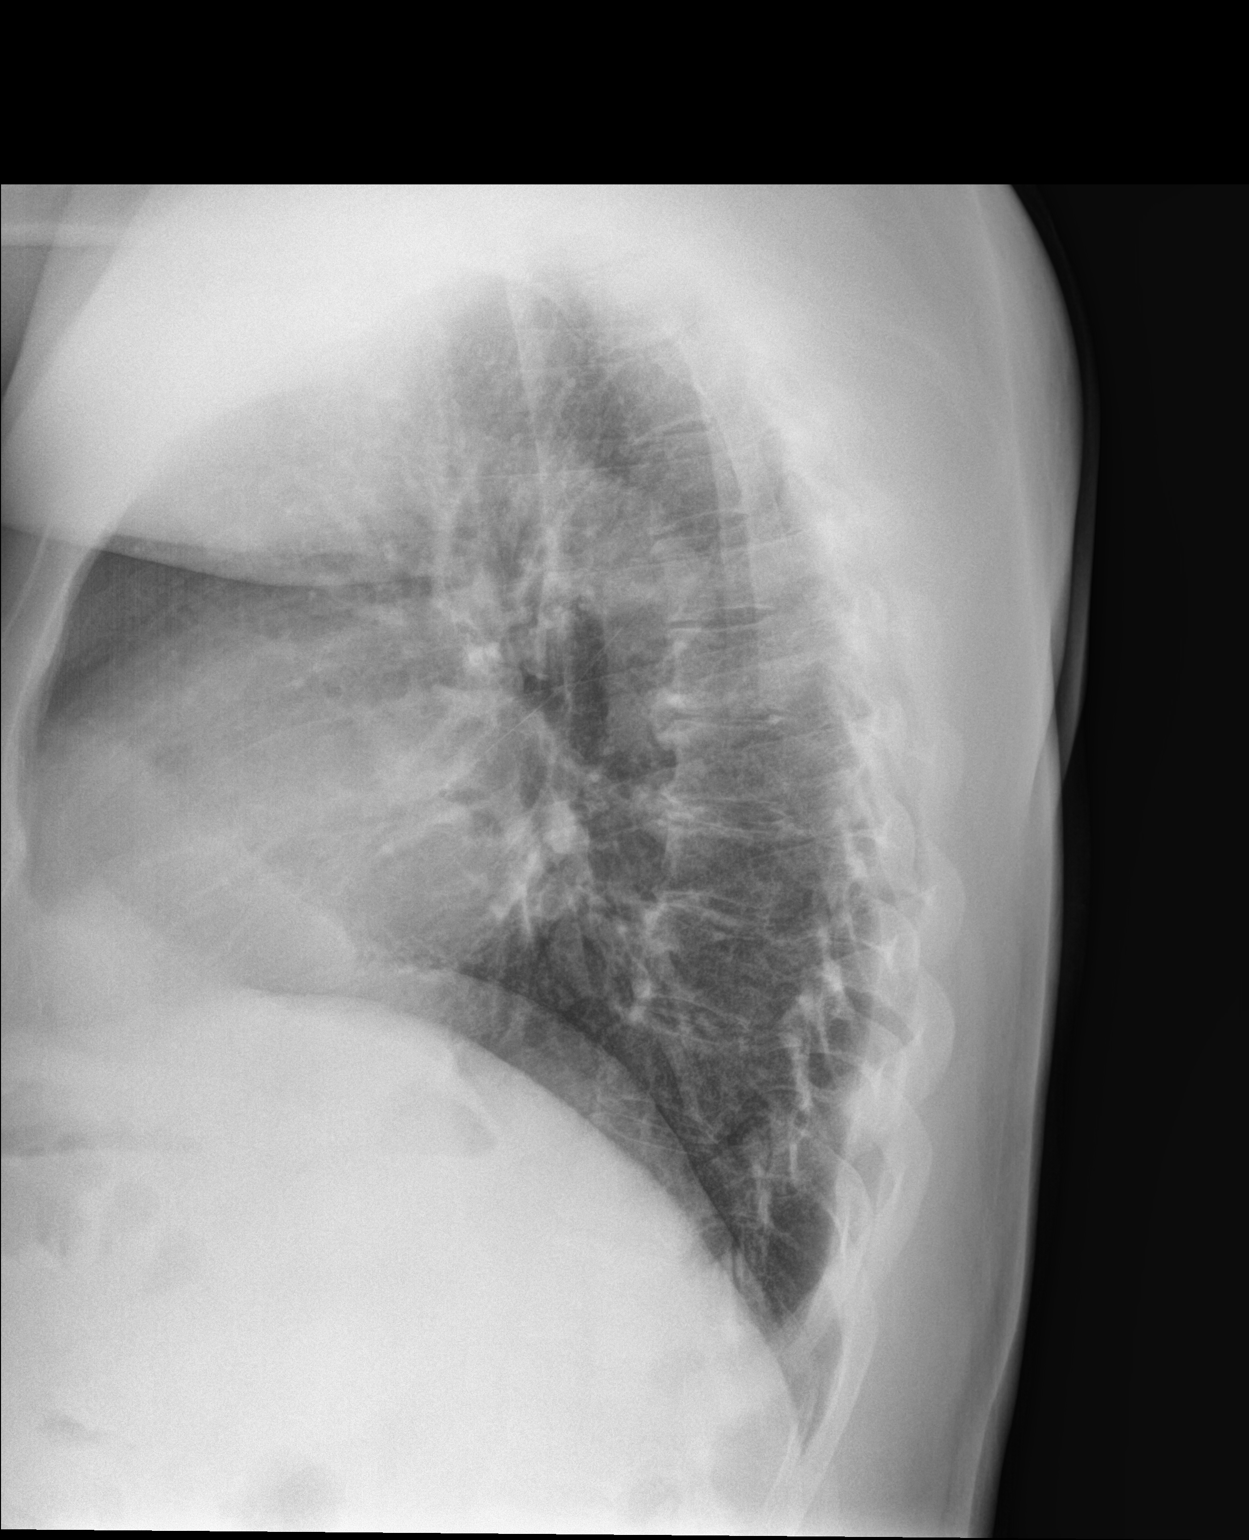

[2 of 2 positions shown; findings below may reference images not displayed]

FINDINGS: Mild opacity is seen in the left base. Heart, hila, mediastinum,
lungs, and pleura are normal.
IMPRESSION: Mild opacity seen in the left base may represent atelectasis or
subtle/early infiltrate/pneumonia. Recommend clinical correlation
and short-term follow-up imaging to ensure resolution.

## 2021-01-25 DIAGNOSIS — N186 End stage renal disease: Secondary | ICD-10-CM | POA: Diagnosis not present

## 2021-01-25 DIAGNOSIS — N2581 Secondary hyperparathyroidism of renal origin: Secondary | ICD-10-CM | POA: Diagnosis not present

## 2021-01-25 DIAGNOSIS — D509 Iron deficiency anemia, unspecified: Secondary | ICD-10-CM | POA: Diagnosis not present

## 2021-01-25 DIAGNOSIS — Z992 Dependence on renal dialysis: Secondary | ICD-10-CM | POA: Diagnosis not present

## 2021-01-27 DIAGNOSIS — Z992 Dependence on renal dialysis: Secondary | ICD-10-CM | POA: Diagnosis not present

## 2021-01-27 DIAGNOSIS — N186 End stage renal disease: Secondary | ICD-10-CM | POA: Diagnosis not present

## 2021-01-27 DIAGNOSIS — D509 Iron deficiency anemia, unspecified: Secondary | ICD-10-CM | POA: Diagnosis not present

## 2021-01-27 DIAGNOSIS — N2581 Secondary hyperparathyroidism of renal origin: Secondary | ICD-10-CM | POA: Diagnosis not present

## 2021-01-29 DIAGNOSIS — D509 Iron deficiency anemia, unspecified: Secondary | ICD-10-CM | POA: Diagnosis not present

## 2021-01-29 DIAGNOSIS — Z992 Dependence on renal dialysis: Secondary | ICD-10-CM | POA: Diagnosis not present

## 2021-01-29 DIAGNOSIS — N186 End stage renal disease: Secondary | ICD-10-CM | POA: Diagnosis not present

## 2021-01-29 DIAGNOSIS — N2581 Secondary hyperparathyroidism of renal origin: Secondary | ICD-10-CM | POA: Diagnosis not present

## 2021-02-01 DIAGNOSIS — N186 End stage renal disease: Secondary | ICD-10-CM | POA: Diagnosis not present

## 2021-02-01 DIAGNOSIS — N2581 Secondary hyperparathyroidism of renal origin: Secondary | ICD-10-CM | POA: Diagnosis not present

## 2021-02-01 DIAGNOSIS — Z992 Dependence on renal dialysis: Secondary | ICD-10-CM | POA: Diagnosis not present

## 2021-02-03 DIAGNOSIS — N2581 Secondary hyperparathyroidism of renal origin: Secondary | ICD-10-CM | POA: Diagnosis not present

## 2021-02-03 DIAGNOSIS — Z992 Dependence on renal dialysis: Secondary | ICD-10-CM | POA: Diagnosis not present

## 2021-02-03 DIAGNOSIS — N186 End stage renal disease: Secondary | ICD-10-CM | POA: Diagnosis not present

## 2021-02-05 DIAGNOSIS — N186 End stage renal disease: Secondary | ICD-10-CM | POA: Diagnosis not present

## 2021-02-05 DIAGNOSIS — N2581 Secondary hyperparathyroidism of renal origin: Secondary | ICD-10-CM | POA: Diagnosis not present

## 2021-02-05 DIAGNOSIS — Z992 Dependence on renal dialysis: Secondary | ICD-10-CM | POA: Diagnosis not present

## 2021-02-08 DIAGNOSIS — N2581 Secondary hyperparathyroidism of renal origin: Secondary | ICD-10-CM | POA: Diagnosis not present

## 2021-02-08 DIAGNOSIS — N186 End stage renal disease: Secondary | ICD-10-CM | POA: Diagnosis not present

## 2021-02-08 DIAGNOSIS — Z992 Dependence on renal dialysis: Secondary | ICD-10-CM | POA: Diagnosis not present

## 2021-02-08 DIAGNOSIS — R52 Pain, unspecified: Secondary | ICD-10-CM | POA: Diagnosis not present

## 2021-02-09 ENCOUNTER — Other Ambulatory Visit: Payer: Self-pay | Admitting: Neurology

## 2021-02-10 DIAGNOSIS — N2581 Secondary hyperparathyroidism of renal origin: Secondary | ICD-10-CM | POA: Diagnosis not present

## 2021-02-10 DIAGNOSIS — N186 End stage renal disease: Secondary | ICD-10-CM | POA: Diagnosis not present

## 2021-02-10 DIAGNOSIS — Z992 Dependence on renal dialysis: Secondary | ICD-10-CM | POA: Diagnosis not present

## 2021-02-10 DIAGNOSIS — R52 Pain, unspecified: Secondary | ICD-10-CM | POA: Diagnosis not present

## 2021-02-12 DIAGNOSIS — N186 End stage renal disease: Secondary | ICD-10-CM | POA: Diagnosis not present

## 2021-02-12 DIAGNOSIS — Z992 Dependence on renal dialysis: Secondary | ICD-10-CM | POA: Diagnosis not present

## 2021-02-12 DIAGNOSIS — N2581 Secondary hyperparathyroidism of renal origin: Secondary | ICD-10-CM | POA: Diagnosis not present

## 2021-02-12 DIAGNOSIS — R52 Pain, unspecified: Secondary | ICD-10-CM | POA: Diagnosis not present

## 2021-02-13 DIAGNOSIS — Z992 Dependence on renal dialysis: Secondary | ICD-10-CM | POA: Diagnosis not present

## 2021-02-13 DIAGNOSIS — N186 End stage renal disease: Secondary | ICD-10-CM | POA: Diagnosis not present

## 2021-02-13 DIAGNOSIS — I129 Hypertensive chronic kidney disease with stage 1 through stage 4 chronic kidney disease, or unspecified chronic kidney disease: Secondary | ICD-10-CM | POA: Diagnosis not present

## 2021-02-15 ENCOUNTER — Other Ambulatory Visit: Payer: Self-pay | Admitting: Neurology

## 2021-02-15 DIAGNOSIS — N2581 Secondary hyperparathyroidism of renal origin: Secondary | ICD-10-CM | POA: Diagnosis not present

## 2021-02-15 DIAGNOSIS — D631 Anemia in chronic kidney disease: Secondary | ICD-10-CM | POA: Diagnosis not present

## 2021-02-15 DIAGNOSIS — Z992 Dependence on renal dialysis: Secondary | ICD-10-CM | POA: Diagnosis not present

## 2021-02-15 DIAGNOSIS — N186 End stage renal disease: Secondary | ICD-10-CM | POA: Diagnosis not present

## 2021-02-17 DIAGNOSIS — N186 End stage renal disease: Secondary | ICD-10-CM | POA: Diagnosis not present

## 2021-02-17 DIAGNOSIS — Z992 Dependence on renal dialysis: Secondary | ICD-10-CM | POA: Diagnosis not present

## 2021-02-17 DIAGNOSIS — N2581 Secondary hyperparathyroidism of renal origin: Secondary | ICD-10-CM | POA: Diagnosis not present

## 2021-02-17 DIAGNOSIS — D631 Anemia in chronic kidney disease: Secondary | ICD-10-CM | POA: Diagnosis not present

## 2021-02-19 DIAGNOSIS — D631 Anemia in chronic kidney disease: Secondary | ICD-10-CM | POA: Diagnosis not present

## 2021-02-19 DIAGNOSIS — N2581 Secondary hyperparathyroidism of renal origin: Secondary | ICD-10-CM | POA: Diagnosis not present

## 2021-02-19 DIAGNOSIS — N186 End stage renal disease: Secondary | ICD-10-CM | POA: Diagnosis not present

## 2021-02-19 DIAGNOSIS — Z992 Dependence on renal dialysis: Secondary | ICD-10-CM | POA: Diagnosis not present

## 2021-02-21 ENCOUNTER — Other Ambulatory Visit: Payer: Self-pay | Admitting: Neurology

## 2021-02-22 DIAGNOSIS — N186 End stage renal disease: Secondary | ICD-10-CM | POA: Diagnosis not present

## 2021-02-22 DIAGNOSIS — N2581 Secondary hyperparathyroidism of renal origin: Secondary | ICD-10-CM | POA: Diagnosis not present

## 2021-02-22 DIAGNOSIS — D509 Iron deficiency anemia, unspecified: Secondary | ICD-10-CM | POA: Diagnosis not present

## 2021-02-22 DIAGNOSIS — Z992 Dependence on renal dialysis: Secondary | ICD-10-CM | POA: Diagnosis not present

## 2021-02-23 ENCOUNTER — Telehealth: Payer: Self-pay

## 2021-02-23 NOTE — Telephone Encounter (Signed)
New message  [employer] is unable to respond with clinical questions. Please see more information at the bottom of the page for next steps.  [name] (Key: [id number]) Nurtec 75MG  dispersible tablets   Form [employer] Electronic PA Form (2017 NCPDP) Created 5 minutes ago Sent to Plan 3 minutes ago Plan Response 3 minutes ago Submit Clinical Questions Determination Message from Plan The patient currently has access to the requested medication and a Prior Authorization is not needed for the patient/medication.

## 2021-02-24 DIAGNOSIS — Z992 Dependence on renal dialysis: Secondary | ICD-10-CM | POA: Diagnosis not present

## 2021-02-24 DIAGNOSIS — N186 End stage renal disease: Secondary | ICD-10-CM | POA: Diagnosis not present

## 2021-02-24 DIAGNOSIS — N2581 Secondary hyperparathyroidism of renal origin: Secondary | ICD-10-CM | POA: Diagnosis not present

## 2021-02-24 DIAGNOSIS — D509 Iron deficiency anemia, unspecified: Secondary | ICD-10-CM | POA: Diagnosis not present

## 2021-02-26 ENCOUNTER — Other Ambulatory Visit: Payer: Self-pay | Admitting: Neurology

## 2021-02-26 DIAGNOSIS — N2581 Secondary hyperparathyroidism of renal origin: Secondary | ICD-10-CM | POA: Diagnosis not present

## 2021-02-26 DIAGNOSIS — D509 Iron deficiency anemia, unspecified: Secondary | ICD-10-CM | POA: Diagnosis not present

## 2021-02-26 DIAGNOSIS — Z992 Dependence on renal dialysis: Secondary | ICD-10-CM | POA: Diagnosis not present

## 2021-02-26 DIAGNOSIS — N186 End stage renal disease: Secondary | ICD-10-CM | POA: Diagnosis not present

## 2021-02-26 NOTE — Telephone Encounter (Signed)
F/u  [name] (Key: [id number]) Nurtec 75MG  dispersible tablets   Form [employer] Electronic PA Form (2017 NCPDP) Created 3 days ago Sent to Plan 3 days ago Plan Response 3 days ago Submit Clinical Questions Determination Message from Plan The patient currently has access to the requested medication and a Prior Authorization is not needed for the patient/medication.

## 2021-03-01 DIAGNOSIS — N2581 Secondary hyperparathyroidism of renal origin: Secondary | ICD-10-CM | POA: Diagnosis not present

## 2021-03-01 DIAGNOSIS — Z992 Dependence on renal dialysis: Secondary | ICD-10-CM | POA: Diagnosis not present

## 2021-03-01 DIAGNOSIS — N186 End stage renal disease: Secondary | ICD-10-CM | POA: Diagnosis not present

## 2021-03-01 DIAGNOSIS — D631 Anemia in chronic kidney disease: Secondary | ICD-10-CM | POA: Diagnosis not present

## 2021-03-03 DIAGNOSIS — Z992 Dependence on renal dialysis: Secondary | ICD-10-CM | POA: Diagnosis not present

## 2021-03-03 DIAGNOSIS — N186 End stage renal disease: Secondary | ICD-10-CM | POA: Diagnosis not present

## 2021-03-03 DIAGNOSIS — D631 Anemia in chronic kidney disease: Secondary | ICD-10-CM | POA: Diagnosis not present

## 2021-03-03 DIAGNOSIS — N2581 Secondary hyperparathyroidism of renal origin: Secondary | ICD-10-CM | POA: Diagnosis not present

## 2021-03-05 DIAGNOSIS — N186 End stage renal disease: Secondary | ICD-10-CM | POA: Diagnosis not present

## 2021-03-05 DIAGNOSIS — N2581 Secondary hyperparathyroidism of renal origin: Secondary | ICD-10-CM | POA: Diagnosis not present

## 2021-03-05 DIAGNOSIS — Z992 Dependence on renal dialysis: Secondary | ICD-10-CM | POA: Diagnosis not present

## 2021-03-05 DIAGNOSIS — D631 Anemia in chronic kidney disease: Secondary | ICD-10-CM | POA: Diagnosis not present

## 2021-03-08 DIAGNOSIS — Z992 Dependence on renal dialysis: Secondary | ICD-10-CM | POA: Diagnosis not present

## 2021-03-08 DIAGNOSIS — R52 Pain, unspecified: Secondary | ICD-10-CM | POA: Diagnosis not present

## 2021-03-08 DIAGNOSIS — N2581 Secondary hyperparathyroidism of renal origin: Secondary | ICD-10-CM | POA: Diagnosis not present

## 2021-03-08 DIAGNOSIS — N186 End stage renal disease: Secondary | ICD-10-CM | POA: Diagnosis not present

## 2021-03-10 DIAGNOSIS — Z992 Dependence on renal dialysis: Secondary | ICD-10-CM | POA: Diagnosis not present

## 2021-03-10 DIAGNOSIS — N2581 Secondary hyperparathyroidism of renal origin: Secondary | ICD-10-CM | POA: Diagnosis not present

## 2021-03-10 DIAGNOSIS — N186 End stage renal disease: Secondary | ICD-10-CM | POA: Diagnosis not present

## 2021-03-10 DIAGNOSIS — R52 Pain, unspecified: Secondary | ICD-10-CM | POA: Diagnosis not present

## 2021-03-11 ENCOUNTER — Other Ambulatory Visit: Payer: Self-pay

## 2021-03-11 MED ORDER — NURTEC 75 MG PO TBDP: 75.0000 mg | ORAL_TABLET | ORAL | 5 refills | Status: DC | PRN

## 2021-03-13 DIAGNOSIS — Z992 Dependence on renal dialysis: Secondary | ICD-10-CM | POA: Diagnosis not present

## 2021-03-13 DIAGNOSIS — R52 Pain, unspecified: Secondary | ICD-10-CM | POA: Diagnosis not present

## 2021-03-13 DIAGNOSIS — N2581 Secondary hyperparathyroidism of renal origin: Secondary | ICD-10-CM | POA: Diagnosis not present

## 2021-03-13 DIAGNOSIS — N186 End stage renal disease: Secondary | ICD-10-CM | POA: Diagnosis not present

## 2021-03-16 ENCOUNTER — Encounter: Payer: Medicare HMO | Admitting: Emergency Medicine

## 2021-03-16 DIAGNOSIS — Z992 Dependence on renal dialysis: Secondary | ICD-10-CM | POA: Diagnosis not present

## 2021-03-16 DIAGNOSIS — N186 End stage renal disease: Secondary | ICD-10-CM | POA: Diagnosis not present

## 2021-03-16 DIAGNOSIS — I129 Hypertensive chronic kidney disease with stage 1 through stage 4 chronic kidney disease, or unspecified chronic kidney disease: Secondary | ICD-10-CM | POA: Diagnosis not present

## 2021-03-17 DIAGNOSIS — Z992 Dependence on renal dialysis: Secondary | ICD-10-CM | POA: Diagnosis not present

## 2021-03-17 DIAGNOSIS — N186 End stage renal disease: Secondary | ICD-10-CM | POA: Diagnosis not present

## 2021-03-17 DIAGNOSIS — N2581 Secondary hyperparathyroidism of renal origin: Secondary | ICD-10-CM | POA: Diagnosis not present

## 2021-03-17 DIAGNOSIS — R52 Pain, unspecified: Secondary | ICD-10-CM | POA: Diagnosis not present

## 2021-03-19 ENCOUNTER — Telehealth: Payer: Self-pay

## 2021-03-19 DIAGNOSIS — Z992 Dependence on renal dialysis: Secondary | ICD-10-CM | POA: Diagnosis not present

## 2021-03-19 DIAGNOSIS — N2581 Secondary hyperparathyroidism of renal origin: Secondary | ICD-10-CM | POA: Diagnosis not present

## 2021-03-19 DIAGNOSIS — R52 Pain, unspecified: Secondary | ICD-10-CM | POA: Diagnosis not present

## 2021-03-19 DIAGNOSIS — N186 End stage renal disease: Secondary | ICD-10-CM | POA: Diagnosis not present

## 2021-03-19 NOTE — Telephone Encounter (Signed)
New message   TIMMEY LAMBA Key: [id number] help? Call us at [phone]  Outcome Additional Information Required  The patient currently has access to the requested medication and a Prior Authorization is not needed for the patient/medication.  Drug Nurtec 75MG  dispersible tablets  [employer] Electronic PA Form [phone] NCPDP)

## 2021-03-22 DIAGNOSIS — N186 End stage renal disease: Secondary | ICD-10-CM | POA: Diagnosis not present

## 2021-03-22 DIAGNOSIS — Z992 Dependence on renal dialysis: Secondary | ICD-10-CM | POA: Diagnosis not present

## 2021-03-22 DIAGNOSIS — N2581 Secondary hyperparathyroidism of renal origin: Secondary | ICD-10-CM | POA: Diagnosis not present

## 2021-03-23 ENCOUNTER — Encounter: Payer: Self-pay | Admitting: Emergency Medicine

## 2021-03-23 ENCOUNTER — Telehealth: Payer: Self-pay

## 2021-03-23 ENCOUNTER — Other Ambulatory Visit: Payer: Self-pay

## 2021-03-23 ENCOUNTER — Ambulatory Visit (INDEPENDENT_AMBULATORY_CARE_PROVIDER_SITE_OTHER): Payer: Medicare HMO | Admitting: Emergency Medicine

## 2021-03-23 VITALS — BP 130/70 | HR 68 | Temp 98.4°F | Ht 69.0 in | Wt 245.5 lb

## 2021-03-23 DIAGNOSIS — Z992 Dependence on renal dialysis: Secondary | ICD-10-CM

## 2021-03-23 DIAGNOSIS — Z7682 Awaiting organ transplant status: Secondary | ICD-10-CM | POA: Diagnosis not present

## 2021-03-23 DIAGNOSIS — N186 End stage renal disease: Secondary | ICD-10-CM | POA: Diagnosis not present

## 2021-03-23 DIAGNOSIS — Z0001 Encounter for general adult medical examination with abnormal findings: Secondary | ICD-10-CM

## 2021-03-23 DIAGNOSIS — E785 Hyperlipidemia, unspecified: Secondary | ICD-10-CM

## 2021-03-23 DIAGNOSIS — I5022 Chronic systolic (congestive) heart failure: Secondary | ICD-10-CM

## 2021-03-23 DIAGNOSIS — I110 Hypertensive heart disease with heart failure: Secondary | ICD-10-CM | POA: Diagnosis not present

## 2021-03-23 DIAGNOSIS — R6889 Other general symptoms and signs: Secondary | ICD-10-CM

## 2021-03-23 LAB — LIPID PANEL
Cholesterol: 330 mg/dL — ABNORMAL HIGH (ref 0–200)
HDL: 27.4 mg/dL — ABNORMAL LOW (ref >39.00)
Total CHOL/HDL Ratio: 12
Triglycerides: 1167 mg/dL — ABNORMAL HIGH (ref 0.0–149.0)

## 2021-03-23 LAB — COMPREHENSIVE METABOLIC PANEL
ALT: 10 U/L (ref 0–53)
AST: 10 U/L (ref 0–37)
Albumin: 4.5 g/dL (ref 3.5–5.2)
Alkaline Phosphatase: 39 U/L (ref 39–117)
BUN: 45 mg/dL — ABNORMAL HIGH (ref 6–23)
CO2: 31 mEq/L (ref 19–32)
Calcium: 9.1 mg/dL (ref 8.4–10.5)
Chloride: 97 mEq/L (ref 96–112)
Creatinine, Ser: 7.62 mg/dL (ref 0.40–1.50)
GFR: 7.38 mL/min — CL (ref >60.00)
Glucose, Bld: 87 mg/dL (ref 70–99)
Potassium: 4.5 mEq/L (ref 3.5–5.1)
Sodium: 137 mEq/L (ref 135–145)
Total Bilirubin: 0.5 mg/dL (ref 0.2–1.2)
Total Protein: 7.1 g/dL (ref 6.0–8.3)

## 2021-03-23 LAB — CBC WITH DIFFERENTIAL/PLATELET
Basophils Absolute: 0.1 10*3/uL (ref 0.0–0.1)
Basophils Relative: 0.8 % (ref 0.0–3.0)
Eosinophils Absolute: 0.1 10*3/uL (ref 0.0–0.7)
Eosinophils Relative: 1 % (ref 0.0–5.0)
HCT: 33.3 % — ABNORMAL LOW (ref 39.0–52.0)
Hemoglobin: 11.2 g/dL — ABNORMAL LOW (ref 13.0–17.0)
Lymphocytes Relative: 25 % (ref 12.0–46.0)
Lymphs Abs: 1.6 10*3/uL (ref 0.7–4.0)
MCHC: 33.7 g/dL (ref 30.0–36.0)
MCV: 86 fl (ref 78.0–100.0)
Monocytes Absolute: 0.7 10*3/uL (ref 0.1–1.0)
Monocytes Relative: 10.6 % (ref 3.0–12.0)
Neutro Abs: 4 10*3/uL (ref 1.4–7.7)
Neutrophils Relative %: 62.6 % (ref 43.0–77.0)
Platelets: 184 10*3/uL (ref 150.0–400.0)
RBC: 3.87 Mil/uL — ABNORMAL LOW (ref 4.22–5.81)
RDW: 16.4 % — ABNORMAL HIGH (ref 11.5–15.5)
WBC: 6.5 10*3/uL (ref 4.0–10.5)

## 2021-03-23 LAB — LDL CHOLESTEROL, DIRECT: Direct LDL: 53 mg/dL

## 2021-03-23 NOTE — Progress Notes (Signed)
[name] 56 y.o.   Chief Complaint  Patient presents with   Annual Exam    HISTORY OF PRESENT ILLNESS: This is a 56 y.o. male dialysis patient here for annual exam. Also has history of hypertensive heart disease with congestive heart failure.  Stable.  No complaints. Complaining of increased secretions from ears, nose, and mouth intermittently for the past 2 years. Also complaining of diminished taste and smell.  No recent COVID infection. No other complaints or medical concerns today. On kidney transplant list.  Most recent evaluation as follows: HISTORY OF PRESENT ILLNESS [name] is a 56 y.o. male?with ESRD attributed to HTN, hx of right nephrectomy d/t multiple renal calculi 02/2011, abdominal surgery d/t internal bleeding in 1991 who presents to our clinic for an annual visit regarding their candidacy for kidney transplantation. [name] remains in evaluation. He is blood type O+. He has 1 year 4 months of dialysis time. The patient was seen with [name].  Comorbid Conditions  The patient has had an emergency room visited in late November 2022 for influenza. He was not admitted. He states they gave him a breathing treatment and supportive care only and was sent home. He is still recovering from this today but denies any fever or chills, still with cough and congestion.   The patient attends dialysis on a Monday, Wednesday, Friday schedule at the [facility]. He runs for 4 hours. He reports that his fluid gains are usually around 3 kg. He states that he has been attending all his treatments. He brings the past 3-4 months of report cards with him and we reviewed these today. He states is still feeling tired following dialysis and needs to rest on dialysis days. On off days he is able to do activities with his children.   The patient does regular exercise on the weekends only. He states when his children play soccer he will walk 1-1/2 laps to  2 laps around the soccer field. He describes this as more of a stroll. He states that he is able to lie flat to sleep. He previously was tested for OSA and got a CPAP but he does not wear this.  The majority of our conversation today focused on the fact that he was told that he was nonadherent to the medical regimen with dialysis that he was prescribed. I also questioned him on his admission that he had for congestive heart failure as there was a question whether he had stopped all his antihypertensives. The patient states that he has always attended his dialysis sessions and would not stop medication unless he was directed by a physician.   HPI   Prior to Admission medications   Medication Sig Start Date End Date Taking? Authorizing Provider  B Complex-C-Zn-Folic Acid (DIALYVITE 332 WITH ZINC) 0.8 MG TABS Take 1 tablet by mouth daily. 06/22/20  Yes [provider]  benzoyl peroxide (BENZOYL PEROXIDE) 5 % external liquid Apply topically 2 (two) times daily. 12/30/19  Yes Hartleton, Ines Bloomer, MD  Betamethasone Sodium Phosphate 6 MG/ML SOLN INJECT 2:4 SYRINGE BILATERALLY INTO [facility] KNEE JOINT 06/21/19  Yes [provider]  calcium acetate (PHOSLO) 667 MG capsule Take 2 capsules (1,334 mg total) by mouth 3 (three) times daily with meals. 12/30/19  Yes Austyn Seier, Ines Bloomer, MD  icosapent Ethyl (VASCEPA) 1 g capsule Take 2 capsules (2 g total) by mouth 2 (two) times daily. 10/13/20  Yes Hilty, Nadean Corwin, MD  lidocaine-prilocaine (EMLA) cream Apply  topically. 06/11/19  Yes [provider]  Methoxy PEG-Epoetin Beta (MIRCERA IJ) Mircera 11/12/19 05/05/21 Yes [provider]  ondansetron (ZOFRAN) 4 MG tablet Take 1 tablet (4 mg total) by mouth every 8 (eight) hours as needed for up to 15 doses for nausea or vomiting. 01/10/21  Yes Wyvonnia Dusky, MD  pantoprazole (PROTONIX) 40 MG tablet Take 1 tablet (40 mg total) by mouth daily. 04/17/19  Yes Alma Friendly, MD   Rimegepant Sulfate (NURTEC) 75 MG TBDP Take 75 mg by mouth as needed. Maxium 1 tab in 24 hours 03/11/21  Yes Jaffe, Adam R, DO  amLODipine (NORVASC) 5 MG tablet Take 1 tablet (5 mg total) by mouth daily. 04/18/19 09/15/20  Alma Friendly, MD  carvedilol (COREG) 25 MG tablet Take 1 tablet (25 mg total) by mouth 2 (two) times daily with a meal. 04/17/19 09/15/20  Alma Friendly, MD  hydrALAZINE (APRESOLINE) 25 MG tablet Take 1 tablet (25 mg total) by mouth every 8 (eight) hours. 04/17/19 09/15/20  Alma Friendly, MD  Ibuprofen & Acetaminophen 600 & 500 MG TBPK Take 1 tablet by mouth every 8 (eight) hours as needed. Patient not taking: Reported on 03/23/2021 07/07/20   Horald Pollen, MD    Allergies  Allergen Reactions   Milly Jakob [Sodium Zirconium Cyclosilicate] Shortness Of Breath   Beef-Derived Products Other (See Comments)    Cultural preference   Pegademase Bovine Other (See Comments)    Cultural preference   Poractant Alfa Other (See Comments)    Cultural preference   Pork-Derived Products Other (See Comments)    Cultural preference    Patient Active Problem List   Diagnosis Date Noted   Other headache syndrome 07/07/2020   Allergy, unspecified, sequela 01/27/2020   Personal history of anaphylaxis 01/27/2020   Encounter for screening for COVID-19 10/31/2019   History of nephrectomy, right 07/23/2019   Low left ventricular ejection fraction 07/23/2019   Obesity (BMI 30-39.9) 07/23/2019   Pre-transplant evaluation for kidney transplant 07/23/2019   Hypertensive heart disease with chronic systolic congestive heart failure ([place]) 07/01/2019   Iron deficiency anemia, unspecified 04/25/2019   Encounter for immunization 04/22/2019   Anemia in chronic kidney disease 04/17/2019   End stage renal disease on dialysis ([place]) 04/17/2019   Pain, unspecified 04/17/2019   Encounter for screening for respiratory tuberculosis 04/17/2019   Pruritus, unspecified 04/17/2019   Secondary  hyperparathyroidism of renal origin ([place]) 04/17/2019   DCM (dilated cardiomyopathy) ([place])    CKD (chronic kidney disease) stage 5, GFR less than 15 ml/min ([place]) 04/11/2019   History of colon polyps 03/20/2019   Gastroesophageal reflux disease 03/20/2019   CKD (chronic kidney disease) stage 4, GFR 15-29 ml/min ([place]) 08/25/2018   CKD (chronic kidney disease) stage 3, GFR 30-59 ml/min ([place]) 05/10/2017   Renal insufficiency 11/15/2016   Testicular hypofunction 09/15/2015   BPH without obstruction/lower urinary tract symptoms 09/15/2015   Bilateral hearing loss 07/03/2015   Mixed hearing loss of right ear 07/03/2015   Dyslipidemia 03/29/2013   Nephrolithiasis 03/14/2013   Lumbar radiculopathy 02/26/2013   Vitamin D deficiency 08/03/2011   HLD (hyperlipidemia) 04/10/2010    Past Medical History:  Diagnosis Date   Anemia    Back pain    CKD (chronic kidney disease), stage III (HCC)    Stage 4   Colon polyps    adenomatous   Elevated cholesterol    History of echocardiogram    Echo 11/17: EF 65-70, normal wall motion, grade 1  diastolic dysfunction, trivial MR, mild LAE, mild TR   Hyperlipidemia    Hypertension    no current bp meds for last 3 months   Kidney stones 2007   Medication intolerance    a. multiple with prior nonadherence to regimen.   Nausea    " in the morning since having the kidney problem "   Otosclerosis of both ears    Pneumonia    PONV (postoperative nausea and vomiting)    PUD (peptic ulcer disease)    Has had unspecified surgery for this   Sleep apnea     Past Surgical History:  Procedure Laterality Date   AV FISTULA PLACEMENT Left 01/04/2019   Procedure: ARTERIOVENOUS (AV) FISTULA CREATION LEFT ARM;  Surgeon: [name], MD;  Location: [facility];  Service: Vascular;  Laterality: Left;   [place] Left 03/11/2019   Procedure: BASILIC VEIN TRANSPOSITION 2ND STAGE LEFT;  Surgeon: [name], MD;  Location: [facility];  Service:  Vascular;  Laterality: Left;   NEPHRECTOMY  02/18/2011   Procedure: NEPHRECTOMY;  Surgeon: Hanley Ben, MD;  Location: [facility];  Service: Urology;  Laterality: Right;   SMALL INTESTINE SURGERY     STAPEDOTOMY  2005   lt ear jan, right ear sept   surgery for ulcers  1990    Social History   Socioeconomic History   Marital status: Married    Spouse name: Salya   Number of children: 5   Years of education: Not on file   Highest education level: 12th grade  Occupational History    Employer: [employer]  Tobacco Use   Smoking status: Never   Smokeless tobacco: Never  Vaping Use   Vaping Use: Never used  Substance and Sexual Activity   Alcohol use: No   Drug use: No   Sexual activity: Yes    Birth control/protection: None  Other Topics Concern   Not on file  Social History Narrative   Lives at home with wife and family. He is from [place]. Came to the [place] in 2002.      Patient is right-handed. He lives in a one level home. He drinks 1-2 cups of coffee and tea a day. He does not exercise.   Social Determinants of Health   Financial Resource Strain: Not on file  Food Insecurity: Not on file  Transportation Needs: Not on file  Physical Activity: Not on file  Stress: Not on file  Social Connections: Not on file  Intimate Partner Violence: Not on file    Family History  Problem Relation Age of Onset   Hyperlipidemia Father    Heart disease Mother    Hypertension Mother    Hypertension Sister    Heart disease Brother 9       CABG   Hyperlipidemia Sister    Hyperlipidemia Sister    Esophageal cancer Cousin    Healthy Child    Colon cancer Neg Hx    Stomach cancer Neg Hx    Rectal cancer Neg Hx      Review of Systems  Constitutional: Negative.  Negative for chills and fever.  HENT: Negative.  Negative for congestion and sore throat.        Diminished taste and smell. Chronic secretions from ears, nose, and mouth per patient.  Respiratory: Negative.   Negative for cough and shortness of breath.   Cardiovascular: Negative.  Negative for chest pain and palpitations.  Gastrointestinal: Negative.  Negative for abdominal pain, diarrhea,  nausea and vomiting.  Skin: Negative.  Negative for rash.  Neurological:  Negative for dizziness and headaches.   Vitals:   03/23/21 1310 03/23/21 1335  BP: (!) 160/82 130/70  Pulse: 68   Temp: 98.4 F (36.9 C)   SpO2: 99%     Physical Exam Vitals reviewed.  Constitutional:      Appearance: Normal appearance.  HENT:     Head: Normocephalic.     Right Ear: Tympanic membrane, ear canal and external ear normal.     Left Ear: Tympanic membrane, ear canal and external ear normal.     Nose: Nose normal.     Mouth/Throat:     Mouth: Mucous membranes are moist.     Pharynx: Oropharynx is clear.  Eyes:     Extraocular Movements: Extraocular movements intact.     Conjunctiva/sclera: Conjunctivae normal.     Pupils: Pupils are equal, round, and reactive to light.  Cardiovascular:     Rate and Rhythm: Normal rate and regular rhythm.     Pulses: Normal pulses.     Heart sounds: Normal heart sounds.  Pulmonary:     Effort: Pulmonary effort is normal.     Breath sounds: Normal breath sounds.  Abdominal:     Palpations: Abdomen is soft.     Tenderness: There is no abdominal tenderness.  Musculoskeletal:     Cervical back: Neck supple. No tenderness.  Lymphadenopathy:     Cervical: No cervical adenopathy.  Skin:    General: Skin is warm and dry.  Neurological:     General: No focal deficit present.     Mental Status: He is alert and oriented to person, place, and time.  Psychiatric:        Mood and Affect: Mood normal.        Behavior: Behavior normal.    Results for orders placed or performed in visit on 03/23/21 (from the past 24 hour(s))  Lipid panel     Status: Abnormal   Collection Time: 03/23/21  1:40 PM  Result Value Ref Range   Cholesterol 330 (H) 0 - 200 mg/dL   Triglycerides (H) 0.0  - 149.0 mg/dL    1167.0 Triglyceride is over 400; calculations on Lipids are invalid.   HDL 27.40 (L) >39.00 mg/dL   Total CHOL/HDL Ratio 12   CBC with Differential/Platelet     Status: Abnormal   Collection Time: 03/23/21  1:40 PM  Result Value Ref Range   WBC 6.5 4.0 - 10.5 K/uL   RBC 3.87 (L) 4.22 - 5.81 Mil/uL   Hemoglobin 11.2 (L) 13.0 - 17.0 g/dL   HCT 33.3 (L) 39.0 - 52.0 %   MCV 86.0 78.0 - 100.0 fl   MCHC 33.7 30.0 - 36.0 g/dL   RDW 16.4 (H) 11.5 - 15.5 %   Platelets 184.0 150.0 - 400.0 K/uL   Neutrophils Relative % 62.6 43.0 - 77.0 %   Lymphocytes Relative 25.0 12.0 - 46.0 %   Monocytes Relative 10.6 3.0 - 12.0 %   Eosinophils Relative 1.0 0.0 - 5.0 %   Basophils Relative 0.8 0.0 - 3.0 %   Neutro Abs 4.0 1.4 - 7.7 K/uL   Lymphs Abs 1.6 0.7 - 4.0 K/uL   Monocytes Absolute 0.7 0.1 - 1.0 K/uL   Eosinophils Absolute 0.1 0.0 - 0.7 K/uL   Basophils Absolute 0.1 0.0 - 0.1 K/uL  Comprehensive metabolic panel     Status: Abnormal   Collection Time: 03/23/21  1:40 PM  Result Value Ref Range   Sodium 137 135 - 145 mEq/L   Potassium 4.5 3.5 - 5.1 mEq/L   Chloride 97 96 - 112 mEq/L   CO2 31 19 - 32 mEq/L   Glucose, Bld 87 70 - 99 mg/dL   BUN 45 (H) 6 - 23 mg/dL   Creatinine, Ser 7.62 (HH) 0.40 - 1.50 mg/dL   Total Bilirubin 0.5 0.2 - 1.2 mg/dL   Alkaline Phosphatase 39 39 - 117 U/L   AST 10 0 - 37 U/L   ALT 10 0 - 53 U/L   Total Protein 7.1 6.0 - 8.3 g/dL   Albumin 4.5 3.5 - 5.2 g/dL   GFR 7.38 (LL) >60.00 mL/min   Calcium 9.1 8.4 - 10.5 mg/dL   Narrative   Critical result called to Aretha on 03/23/2021 4:40 PM by Deborra Medina. Results were read back to caller. (CREA, GFR)  LDL cholesterol, direct     Status: None   Collection Time: 03/23/21  1:40 PM  Result Value Ref Range   Direct LDL 53.0 mg/dL    ASSESSMENT & PLAN: Problem List Items Addressed This Visit       Cardiovascular and Mediastinum   Hypertensive heart disease with chronic systolic congestive  heart failure (HCC)     Genitourinary   End stage renal disease on dialysis ([place])   Relevant Orders   CBC with Differential/Platelet (Completed)   Comprehensive metabolic panel (Completed)     Other   Dyslipidemia   Relevant Orders   Lipid panel (Completed)   AMB Referral to [facility]   Other Visit Diagnoses     Encounter for general adult medical examination with abnormal findings    -  Primary   Awaiting organ transplant status       ENT complaint       Relevant Orders   Ambulatory referral to ENT      Modifiable risk factors discussed with patient. Anticipatory guidance according to age provided. The following topics were also discussed: Social Determinants of Health Smoking.  Non-smoker Diet and nutrition Benefits of exercise Cancer screening and review of most recent colonoscopy report Vaccinations recommendations Cardiovascular risk assessment Chronic ENT complaints and need for ENT evaluation Presence of dyslipidemia with very high triglycerides and need for referral to lipid clinic Mental health including depression and anxiety Fall and accident prevention  Patient Instructions  Health Maintenance, Male Adopting a healthy lifestyle and getting preventive care are important in promoting health and wellness. Ask your health care provider about: The right schedule for you to have regular tests and exams. Things you can do on your own to prevent diseases and keep yourself healthy. What should I know about diet, weight, and exercise? Eat a healthy diet  Eat a diet that includes plenty of vegetables, fruits, low-fat dairy products, and lean protein. Do not eat a lot of foods that are high in solid fats, added sugars, or sodium. Maintain a healthy weight Body mass index (BMI) is a measurement that can be used to identify possible weight problems. It estimates body fat based on height and weight. Your health care provider can help determine your  BMI and help you achieve or maintain a healthy weight. Get regular exercise Get regular exercise. This is one of the most important things you can do for your health. Most adults should: Exercise for at least 150 minutes each week. The exercise should increase your heart rate and make you sweat (moderate-intensity exercise). Do  strengthening exercises at least twice a week. This is in addition to the moderate-intensity exercise. Spend less time sitting. Even light physical activity can be beneficial. Watch cholesterol and blood lipids Have your blood tested for lipids and cholesterol at 56 years of age, then have this test every 5 years. You may need to have your cholesterol levels checked more often if: Your lipid or cholesterol levels are high. You are older than 56 years of age. You are at high risk for heart disease. What should I know about cancer screening? Many types of cancers can be detected early and may often be prevented. Depending on your health history and family history, you may need to have cancer screening at various ages. This may include screening for: Colorectal cancer. Prostate cancer. Skin cancer. Lung cancer. What should I know about heart disease, diabetes, and high blood pressure? Blood pressure and heart disease High blood pressure causes heart disease and increases the risk of stroke. This is more likely to develop in people who have high blood pressure readings or are overweight. Talk with your health care provider about your target blood pressure readings. Have your blood pressure checked: Every 3-5 years if you are 49-102 years of age. Every year if you are 80 years old or older. If you are between the ages of 41 and 63 and are a current or former smoker, ask your health care provider if you should have a one-time screening for abdominal aortic aneurysm (AAA). Diabetes Have regular diabetes screenings. This checks your fasting blood sugar level. Have the  screening done: Once every three years after age 56 if you are at a normal weight and have a low risk for diabetes. More often and at a younger age if you are overweight or have a high risk for diabetes. What should I know about preventing infection? Hepatitis B If you have a higher risk for hepatitis B, you should be screened for this virus. Talk with your health care provider to find out if you are at risk for hepatitis B infection. Hepatitis C Blood testing is recommended for: Everyone born from [birth year] through 1965. Anyone with known risk factors for hepatitis C. Sexually transmitted infections (STIs) You should be screened each year for STIs, including gonorrhea and chlamydia, if: You are sexually active and are younger than 56 years of age. You are older than 56 years of age and your health care provider tells you that you are at risk for this type of infection. Your sexual activity has changed since you were last screened, and you are at increased risk for chlamydia or gonorrhea. Ask your health care provider if you are at risk. Ask your health care provider about whether you are at high risk for HIV. Your health care provider may recommend a prescription medicine to help prevent HIV infection. If you choose to take medicine to prevent HIV, you should first get tested for HIV. You should then be tested every 3 months for as long as you are taking the medicine. Follow these instructions at home: Alcohol use Do not drink alcohol if your health care provider tells you not to drink. If you drink alcohol: Limit how much you have to 0-2 drinks a day. Know how much alcohol is in your drink. In the [place] one drink equals one 12 oz bottle of beer (355 mL), one 5 oz glass of wine (148 mL), or one 1 oz glass of hard liquor (44 mL). Lifestyle Do not use any products  that contain nicotine or tobacco. These products include cigarettes, chewing tobacco, and vaping devices, such as e-cigarettes. If you  need help quitting, ask your health care provider. Do not use street drugs. Do not share needles. Ask your health care provider for help if you need support or information about quitting drugs. General instructions Schedule regular health, dental, and eye exams. Stay current with your vaccines. Tell your health care provider if: You often feel depressed. You have ever been abused or do not feel safe at home. Summary Adopting a healthy lifestyle and getting preventive care are important in promoting health and wellness. Follow your health care provider's instructions about healthy diet, exercising, and getting tested or screened for diseases. Follow your health care provider's instructions on monitoring your cholesterol and blood pressure. This information is not intended to replace advice given to you by your health care provider. Make sure you discuss any questions you have with your health care provider. Document Revised: 06/22/2020 Document Reviewed: 06/22/2020 Elsevier Patient Education  2022 [place], MD [facility]

## 2021-03-23 NOTE — Telephone Encounter (Signed)
Lab at [facility] called and stated that pt had a critical

## 2021-03-23 NOTE — Patient Instructions (Signed)

## 2021-03-24 DIAGNOSIS — N186 End stage renal disease: Secondary | ICD-10-CM | POA: Diagnosis not present

## 2021-03-24 DIAGNOSIS — Z992 Dependence on renal dialysis: Secondary | ICD-10-CM | POA: Diagnosis not present

## 2021-03-24 DIAGNOSIS — N2581 Secondary hyperparathyroidism of renal origin: Secondary | ICD-10-CM | POA: Diagnosis not present

## 2021-03-26 DIAGNOSIS — Z992 Dependence on renal dialysis: Secondary | ICD-10-CM | POA: Diagnosis not present

## 2021-03-26 DIAGNOSIS — N2581 Secondary hyperparathyroidism of renal origin: Secondary | ICD-10-CM | POA: Diagnosis not present

## 2021-03-26 DIAGNOSIS — N186 End stage renal disease: Secondary | ICD-10-CM | POA: Diagnosis not present

## 2021-03-29 DIAGNOSIS — N2581 Secondary hyperparathyroidism of renal origin: Secondary | ICD-10-CM | POA: Diagnosis not present

## 2021-03-29 DIAGNOSIS — N186 End stage renal disease: Secondary | ICD-10-CM | POA: Diagnosis not present

## 2021-03-29 DIAGNOSIS — Z992 Dependence on renal dialysis: Secondary | ICD-10-CM | POA: Diagnosis not present

## 2021-03-31 DIAGNOSIS — Z992 Dependence on renal dialysis: Secondary | ICD-10-CM | POA: Diagnosis not present

## 2021-03-31 DIAGNOSIS — N2581 Secondary hyperparathyroidism of renal origin: Secondary | ICD-10-CM | POA: Diagnosis not present

## 2021-03-31 DIAGNOSIS — N186 End stage renal disease: Secondary | ICD-10-CM | POA: Diagnosis not present

## 2021-04-02 DIAGNOSIS — N2581 Secondary hyperparathyroidism of renal origin: Secondary | ICD-10-CM | POA: Diagnosis not present

## 2021-04-02 DIAGNOSIS — Z992 Dependence on renal dialysis: Secondary | ICD-10-CM | POA: Diagnosis not present

## 2021-04-02 DIAGNOSIS — N186 End stage renal disease: Secondary | ICD-10-CM | POA: Diagnosis not present

## 2021-04-05 DIAGNOSIS — N2581 Secondary hyperparathyroidism of renal origin: Secondary | ICD-10-CM | POA: Diagnosis not present

## 2021-04-05 DIAGNOSIS — N186 End stage renal disease: Secondary | ICD-10-CM | POA: Diagnosis not present

## 2021-04-05 DIAGNOSIS — Z992 Dependence on renal dialysis: Secondary | ICD-10-CM | POA: Diagnosis not present

## 2021-04-07 DIAGNOSIS — N2581 Secondary hyperparathyroidism of renal origin: Secondary | ICD-10-CM | POA: Diagnosis not present

## 2021-04-07 DIAGNOSIS — Z992 Dependence on renal dialysis: Secondary | ICD-10-CM | POA: Diagnosis not present

## 2021-04-07 DIAGNOSIS — N186 End stage renal disease: Secondary | ICD-10-CM | POA: Diagnosis not present

## 2021-04-09 DIAGNOSIS — Z992 Dependence on renal dialysis: Secondary | ICD-10-CM | POA: Diagnosis not present

## 2021-04-09 DIAGNOSIS — N2581 Secondary hyperparathyroidism of renal origin: Secondary | ICD-10-CM | POA: Diagnosis not present

## 2021-04-09 DIAGNOSIS — N186 End stage renal disease: Secondary | ICD-10-CM | POA: Diagnosis not present

## 2021-04-12 DIAGNOSIS — N186 End stage renal disease: Secondary | ICD-10-CM | POA: Diagnosis not present

## 2021-04-12 DIAGNOSIS — N2581 Secondary hyperparathyroidism of renal origin: Secondary | ICD-10-CM | POA: Diagnosis not present

## 2021-04-12 DIAGNOSIS — R52 Pain, unspecified: Secondary | ICD-10-CM | POA: Diagnosis not present

## 2021-04-12 DIAGNOSIS — Z992 Dependence on renal dialysis: Secondary | ICD-10-CM | POA: Diagnosis not present

## 2021-04-13 DIAGNOSIS — N186 End stage renal disease: Secondary | ICD-10-CM | POA: Diagnosis not present

## 2021-04-13 DIAGNOSIS — Z992 Dependence on renal dialysis: Secondary | ICD-10-CM | POA: Diagnosis not present

## 2021-04-13 DIAGNOSIS — I129 Hypertensive chronic kidney disease with stage 1 through stage 4 chronic kidney disease, or unspecified chronic kidney disease: Secondary | ICD-10-CM | POA: Diagnosis not present

## 2021-04-14 ENCOUNTER — Emergency Department (HOSPITAL_COMMUNITY): Payer: Medicare HMO

## 2021-04-14 ENCOUNTER — Encounter (HOSPITAL_COMMUNITY): Payer: Self-pay

## 2021-04-14 ENCOUNTER — Emergency Department (HOSPITAL_COMMUNITY)
Admission: EM | Admit: 2021-04-14 | Discharge: 2021-04-15 | Disposition: A | Discharge status: Home / Self Care | Payer: Medicare HMO | Attending: Emergency Medicine | Admitting: Emergency Medicine

## 2021-04-14 ENCOUNTER — Other Ambulatory Visit: Payer: Self-pay

## 2021-04-14 DIAGNOSIS — R0981 Nasal congestion: Secondary | ICD-10-CM

## 2021-04-14 DIAGNOSIS — J029 Acute pharyngitis, unspecified: Secondary | ICD-10-CM | POA: Diagnosis not present

## 2021-04-14 DIAGNOSIS — M25122 Fistula, left elbow: Secondary | ICD-10-CM | POA: Insufficient documentation

## 2021-04-14 DIAGNOSIS — Z20822 Contact with and (suspected) exposure to covid-19: Secondary | ICD-10-CM | POA: Insufficient documentation

## 2021-04-14 DIAGNOSIS — Z992 Dependence on renal dialysis: Secondary | ICD-10-CM | POA: Diagnosis not present

## 2021-04-14 DIAGNOSIS — I700 Atherosclerosis of aorta: Secondary | ICD-10-CM | POA: Diagnosis not present

## 2021-04-14 DIAGNOSIS — J069 Acute upper respiratory infection, unspecified: Secondary | ICD-10-CM | POA: Diagnosis not present

## 2021-04-14 DIAGNOSIS — R0602 Shortness of breath: Secondary | ICD-10-CM | POA: Insufficient documentation

## 2021-04-14 DIAGNOSIS — Z79899 Other long term (current) drug therapy: Secondary | ICD-10-CM | POA: Diagnosis not present

## 2021-04-14 DIAGNOSIS — R059 Cough, unspecified: Secondary | ICD-10-CM | POA: Diagnosis not present

## 2021-04-14 DIAGNOSIS — R9431 Abnormal electrocardiogram [ECG] [EKG]: Secondary | ICD-10-CM | POA: Diagnosis not present

## 2021-04-14 NOTE — ED Triage Notes (Signed)
Reports feeling warm to the touch, body aches, shob when laying in bed, coughing up yellow sputum and blood. Dialysis pt.  ?

## 2021-04-14 NOTE — ED Provider Triage Note (Signed)
Emergency Medicine Provider Triage Evaluation Note ? ?[name] , a 56 y.o. male  was evaluated in triage.  Pt complains of congestion, myalgias, subjective fever onset on Monday. Notes difficulty breathing, mostly at nighttime when lying flat. Has had a cough productive of yellow phelgm; sometimes streaked with blood. Also reports anorexia. Denies leg swelling, increased weight gain, vomiting. Dialyzed M/W/F, but did not attend dialysis today. ? ?Review of Systems  ?Positive: As above ?Negative: As above ? ?Physical Exam  ?BP (!) 164/92 (BP Location: Right Arm)   Pulse 85   Temp 98.4 ?F (36.9 ?C) (Oral)   Resp 18   SpO2 99%  ?Gen:   Awake, no distress   ?Resp:  Normal effort  ?MSK:   Moves extremities without difficulty  ?Other:  Lungs without wheezing. No frank rales noted. No BLE edema. ? ?Medical Decision Making  ?Medically screening exam initiated at 11:07 PM.  Appropriate orders placed.  [name] was informed that the remainder of the evaluation will be completed by another provider, this initial triage assessment does not replace that evaluation, and the importance of remaining in the ED until their evaluation is complete. ? ?URI sxs in ESRD patient - pending labs, CXR, COVID. BNP ordered given hx of CHF. ?  Antonietta Breach, PA-C ?04/14/21 2311 ? ?

## 2021-04-15 LAB — COMPREHENSIVE METABOLIC PANEL
ALT: 18 U/L (ref 0–44)
AST: 10 U/L — ABNORMAL LOW (ref 15–41)
Albumin: 3.8 g/dL (ref 3.5–5.0)
Alkaline Phosphatase: 45 U/L (ref 38–126)
Anion gap: 14 (ref 5–15)
BUN: 54 mg/dL — ABNORMAL HIGH (ref 6–20)
CO2: 23 mmol/L (ref 22–32)
Calcium: 9 mg/dL (ref 8.9–10.3)
Chloride: 100 mmol/L (ref 98–111)
Creatinine, Ser: 11.48 mg/dL — ABNORMAL HIGH (ref 0.61–1.24)
GFR, Estimated: 5 mL/min — ABNORMAL LOW (ref >60)
Glucose, Bld: 91 mg/dL (ref 70–99)
Potassium: 5.4 mmol/L — ABNORMAL HIGH (ref 3.5–5.1)
Sodium: 137 mmol/L (ref 135–145)
Total Bilirubin: 0.3 mg/dL (ref 0.3–1.2)
Total Protein: 7.2 g/dL (ref 6.5–8.1)

## 2021-04-15 LAB — CBC WITH DIFFERENTIAL/PLATELET
Abs Immature Granulocytes: 0.07 10*3/uL (ref 0.00–0.07)
Basophils Absolute: 0 10*3/uL (ref 0.0–0.1)
Basophils Relative: 0 %
Eosinophils Absolute: 0.1 10*3/uL (ref 0.0–0.5)
Eosinophils Relative: 1 %
HCT: 30.6 % — ABNORMAL LOW (ref 39.0–52.0)
Hemoglobin: 10.3 g/dL — ABNORMAL LOW (ref 13.0–17.0)
Immature Granulocytes: 1 %
Lymphocytes Relative: 20 %
Lymphs Abs: 2 10*3/uL (ref 0.7–4.0)
MCH: 30.3 pg (ref 26.0–34.0)
MCHC: 33.7 g/dL (ref 30.0–36.0)
MCV: 90 fL (ref 80.0–100.0)
Monocytes Absolute: 1.1 10*3/uL — ABNORMAL HIGH (ref 0.1–1.0)
Monocytes Relative: 11 %
Neutro Abs: 6.8 10*3/uL (ref 1.7–7.7)
Neutrophils Relative %: 67 %
Platelets: 163 10*3/uL (ref 150–400)
RBC: 3.4 MIL/uL — ABNORMAL LOW (ref 4.22–5.81)
RDW: 14.8 % (ref 11.5–15.5)
WBC: 10.1 10*3/uL (ref 4.0–10.5)
nRBC: 0 % (ref 0.0–0.2)

## 2021-04-15 LAB — RESP PANEL BY RT-PCR (FLU A&B, COVID) ARPGX2
Influenza A by PCR: NEGATIVE
Influenza B by PCR: NEGATIVE
SARS Coronavirus 2 by RT PCR: NEGATIVE

## 2021-04-15 LAB — BRAIN NATRIURETIC PEPTIDE: B Natriuretic Peptide: 191.8 pg/mL — ABNORMAL HIGH (ref 0.0–100.0)

## 2021-04-15 MED ORDER — SODIUM ZIRCONIUM CYCLOSILICATE 10 G PO PACK
10.0000 g | PACK | Freq: Once | ORAL | Status: AC
  Administered 2021-04-15: 10 g via ORAL
  Filled 2021-04-15: qty 1

## 2021-04-15 MED ORDER — OXYMETAZOLINE HCL 0.05 % NA SOLN
2.0000 | Freq: Once | NASAL | Status: AC
  Administered 2021-04-15: 2 via NASAL
  Filled 2021-04-15: qty 30

## 2021-04-15 NOTE — ED Provider Notes (Signed)
[facility] EMERGENCY DEPARTMENT Provider Note   CSN: [id number] Arrival date & time: 04/14/21  2132     History  Chief Complaint  Patient presents with   Cough    [name] is a 56 y.o. male.  HPI Patient complains of ongoing difficulty with nasal congestion and sore throat after dialysis.  He had come in yesterday and had a prolonged stay in the waiting room, for ongoing similar symptoms.  He states he typically has these problems at the end of dialysis and they usually get better the next day.  He last dialyzed 4 days ago and the symptoms persisted so he decided to come in last night to get them checked out.  His problems have been going on for years and he is talked to both his nephrologist and primary care doctor, and no one can tell him what it is caused by or help it get better.  He denies fever, vomiting, dizziness or weakness.    Home Medications Prior to Admission medications   Medication Sig Start Date End Date Taking? Authorizing Provider  amLODipine (NORVASC) 5 MG tablet Take 1 tablet (5 mg total) by mouth daily. 04/18/19 09/15/20  Briant Cedar, MD  B Complex-C-Zn-Folic Acid (DIALYVITE 800 WITH ZINC) 0.8 MG TABS Take 1 tablet by mouth daily. 06/22/20   [provider]  benzoyl peroxide (BENZOYL PEROXIDE) 5 % external liquid Apply topically 2 (two) times daily. 12/30/19   Georgina Quint, MD  Betamethasone Sodium Phosphate 6 MG/ML SOLN INJECT 2:4 SYRINGE BILATERALLY INTO [facility] KNEE JOINT 06/21/19   [provider]  calcium acetate (PHOSLO) 667 MG capsule Take 2 capsules (1,334 mg total) by mouth 3 (three) times daily with meals. 12/30/19   Georgina Quint, MD  carvedilol (COREG) 25 MG tablet Take 1 tablet (25 mg total) by mouth 2 (two) times daily with a meal. 04/17/19 09/15/20  Briant Cedar, MD  hydrALAZINE (APRESOLINE) 25 MG tablet Take 1 tablet (25 mg total) by mouth every 8 (eight) hours. 04/17/19 09/15/20  Briant Cedar, MD  Ibuprofen & Acetaminophen 600 & 500 MG TBPK Take 1 tablet by mouth every 8 (eight) hours as needed. Patient not taking: Reported on 03/23/2021 07/07/20   Georgina Quint, MD  icosapent Ethyl (VASCEPA) 1 g capsule Take 2 capsules (2 g total) by mouth 2 (two) times daily. 10/13/20   Hilty, Lisette Abu, MD  lidocaine-prilocaine (EMLA) cream Apply topically. 06/11/19   [provider]  Methoxy PEG-Epoetin Beta (MIRCERA IJ) Mircera 11/12/19 05/05/21  [provider]  ondansetron (ZOFRAN) 4 MG tablet Take 1 tablet (4 mg total) by mouth every 8 (eight) hours as needed for up to 15 doses for nausea or vomiting. 01/10/21   Terald Sleeper, MD  pantoprazole (PROTONIX) 40 MG tablet Take 1 tablet (40 mg total) by mouth daily. 04/17/19   Briant Cedar, MD  Rimegepant Sulfate (NURTEC) 75 MG TBDP Take 75 mg by mouth as needed. Maxium 1 tab in 24 hours 03/11/21   Drema Dallas, DO      Allergies    Lokelma [sodium zirconium cyclosilicate], Beef-derived products, Pegademase bovine, Poractant alfa, and Pork-derived products    Review of Systems   Review of Systems  Physical Exam Updated Vital Signs BP 139/70   Pulse 81   Temp 97.7 F (36.5 C) (Oral)   Resp 16   SpO2 100%  Physical Exam Vitals and nursing note reviewed.  Constitutional:  General: He is not in acute distress.    Appearance: He is well-developed. He is not ill-appearing, toxic-appearing or diaphoretic.  HENT:     Head: Normocephalic and atraumatic.     Right Ear: External ear normal.     Left Ear: External ear normal.     Mouth/Throat:     Mouth: Mucous membranes are moist.     Pharynx: Oropharynx is clear. No oropharyngeal exudate or posterior oropharyngeal erythema.  Eyes:     General:        Right eye: No discharge.        Left eye: No discharge.     Conjunctiva/sclera: Conjunctivae normal.     Pupils: Pupils are equal, round, and reactive to light.  Neck:     Trachea: Phonation  normal.  Cardiovascular:     Rate and Rhythm: Normal rate and regular rhythm.     Heart sounds: Normal heart sounds.     Comments: Vascular fistula left arm with normal pulse. Pulmonary:     Effort: Pulmonary effort is normal. No respiratory distress.     Breath sounds: Normal breath sounds. No stridor.  Abdominal:     General: There is no distension.     Palpations: Abdomen is soft.     Tenderness: There is no abdominal tenderness.  Musculoskeletal:        General: Normal range of motion.     Cervical back: Normal range of motion and neck supple.  Skin:    General: Skin is warm and dry.  Neurological:     Mental Status: He is alert and oriented to person, place, and time.     Cranial Nerves: No cranial nerve deficit.     Sensory: No sensory deficit.     Motor: No abnormal muscle tone.     Coordination: Coordination normal.  Psychiatric:        Mood and Affect: Mood normal.        Behavior: Behavior normal.        Thought Content: Thought content normal.        Judgment: Judgment normal.    ED Results / Procedures / Treatments   Labs (all labs ordered are listed, but only abnormal results are displayed) Labs Reviewed  CBC WITH DIFFERENTIAL/PLATELET - Abnormal; Notable for the following components:      Result Value   RBC 3.40 (*)    Hemoglobin 10.3 (*)    HCT 30.6 (*)    Monocytes Absolute 1.1 (*)    All other components within normal limits  COMPREHENSIVE METABOLIC PANEL - Abnormal; Notable for the following components:   Potassium 5.4 (*)    BUN 54 (*)    Creatinine, Ser 11.48 (*)    AST 10 (*)    GFR, Estimated 5 (*)    All other components within normal limits  BRAIN NATRIURETIC PEPTIDE - Abnormal; Notable for the following components:   B Natriuretic Peptide 191.8 (*)    All other components within normal limits  RESP PANEL BY RT-PCR (FLU A&B, COVID) [id number]    EKG EKG Interpretation  Date/Time:  Wednesday April 14 2021 22:29:54 EST Ventricular Rate:   87 PR Interval:  166 QRS Duration: 92 QT Interval:  350 QTC Calculation: 421 R Axis:   10 Text Interpretation: Normal sinus rhythm Normal ECG When compared with ECG of 11-Apr-2019 21:41, PREVIOUS ECG IS PRESENT since last tracing no significant change Confirmed by Mancel Bale [phone]) on 04/15/2021 9:03:58 AM  Radiology DG Chest  2 View  Result Date: 04/15/2021 CLINICAL DATA:  URI sxs EXAM: CHEST - 2 VIEW.  Patient is slightly rotated on frontal view. COMPARISON:  Chest x-ray 10/31/2019 FINDINGS: The heart and mediastinal contours are unchanged. Aortic calcification. Streaky airspace opacity at the right base likely atelectasis. No focal consolidation. No pulmonary edema. No pleural effusion. No pneumothorax. No acute osseous abnormality. IMPRESSION: 1. No active cardiopulmonary disease. 2.  Aortic Atherosclerosis (ICD10-I70.0). Electronically Signed   By: Tish Frederickson M.D.   On: 04/15/2021 00:10    Procedures Procedures    Medications Ordered in ED Medications  sodium zirconium cyclosilicate (LOKELMA) packet 10 g (has no administration in time range)  oxymetazoline (AFRIN) 0.05 % nasal spray 2 spray (2 sprays Each Nare Given 04/15/21 0915)    ED Course/ Medical Decision Making/ A&P                           Medical Decision Making Ongoing symptoms, intermittent associated with dialysis, now more persistent, possibly related to URI.  He skipped dialysis yesterday because he felt badly.  He plans on going to dialysis, tomorrow.  Amount and/or Complexity of Data Reviewed Independent Historian:     Details: He is a cogent historian External Data Reviewed: labs and notes.    Details: PCP visit on 03/23/2021-patient discussed similar problems with his PCP also had counseling regarding chronic illnesses importance of medication noncompliance and adherence with dialysis schedule.  Apparently he has missed both medicines and dialysis, repeatedly. Labs: ordered.    Details: CBC, metabolic  panel, viral panel-changes consistent with chronic end-stage renal disease, mild elevation of potassium at 5.4, creatinine elevated at 11.5 BNP high, 191.  Remainder normal. Radiology: ordered and independent interpretation performed.    Details: Chest x-ray no acute abnormalities  Risk Prescription drug management. Decision regarding hospitalization. Risk Details: Patient presenting with ongoing symptoms, and possible URI, not related to influenza or COVID.  Mild incidental hyperkalemia associated with missing dialysis yesterday.  Unclear symptoms, possibly related to allergies versus URI.  He is given Afrin to use, from the ED.  He is given a dose of Lokelma and advised to follow-up for dialysis, tomorrow.  He does not require emergent dialysis today or hospitalization.           Final Clinical Impression(s) / ED Diagnoses Final diagnoses:  Nasal congestion  Sore throat    Rx / DC Orders ED Discharge Orders     None         Mancel Bale, MD 04/15/21 [phone]

## 2021-04-15 NOTE — Discharge Instructions (Signed)
The testing today did not show any serious problems.  We are giving you medication to use in your nose, 1 spray in each nostril, twice a day to help with your symptoms.  Make sure you go to dialysis tomorrow because your potassium was elevated today.  See your doctor as needed for problems. ?

## 2021-04-15 NOTE — ED Notes (Signed)
EDP into room, at BS.  ?

## 2021-04-15 NOTE — ED Notes (Signed)
EDP back at BS.  

## 2021-04-15 NOTE — ED Notes (Signed)
AVG L upper arm, +bruit/thrill. HD MWF.  ?

## 2021-04-16 DIAGNOSIS — N186 End stage renal disease: Secondary | ICD-10-CM | POA: Diagnosis not present

## 2021-04-16 DIAGNOSIS — N2581 Secondary hyperparathyroidism of renal origin: Secondary | ICD-10-CM | POA: Diagnosis not present

## 2021-04-16 DIAGNOSIS — Z992 Dependence on renal dialysis: Secondary | ICD-10-CM | POA: Diagnosis not present

## 2021-04-19 DIAGNOSIS — N186 End stage renal disease: Secondary | ICD-10-CM | POA: Diagnosis not present

## 2021-04-19 DIAGNOSIS — Z992 Dependence on renal dialysis: Secondary | ICD-10-CM | POA: Diagnosis not present

## 2021-04-19 DIAGNOSIS — N2581 Secondary hyperparathyroidism of renal origin: Secondary | ICD-10-CM | POA: Diagnosis not present

## 2021-04-19 DIAGNOSIS — D631 Anemia in chronic kidney disease: Secondary | ICD-10-CM | POA: Diagnosis not present

## 2021-04-21 DIAGNOSIS — Z992 Dependence on renal dialysis: Secondary | ICD-10-CM | POA: Diagnosis not present

## 2021-04-21 DIAGNOSIS — N2581 Secondary hyperparathyroidism of renal origin: Secondary | ICD-10-CM | POA: Diagnosis not present

## 2021-04-21 DIAGNOSIS — Z7682 Awaiting organ transplant status: Secondary | ICD-10-CM | POA: Diagnosis not present

## 2021-04-21 DIAGNOSIS — N186 End stage renal disease: Secondary | ICD-10-CM | POA: Diagnosis not present

## 2021-04-21 DIAGNOSIS — D631 Anemia in chronic kidney disease: Secondary | ICD-10-CM | POA: Diagnosis not present

## 2021-04-23 DIAGNOSIS — D631 Anemia in chronic kidney disease: Secondary | ICD-10-CM | POA: Diagnosis not present

## 2021-04-23 DIAGNOSIS — N2581 Secondary hyperparathyroidism of renal origin: Secondary | ICD-10-CM | POA: Diagnosis not present

## 2021-04-23 DIAGNOSIS — Z992 Dependence on renal dialysis: Secondary | ICD-10-CM | POA: Diagnosis not present

## 2021-04-23 DIAGNOSIS — N186 End stage renal disease: Secondary | ICD-10-CM | POA: Diagnosis not present

## 2021-04-26 DIAGNOSIS — N2581 Secondary hyperparathyroidism of renal origin: Secondary | ICD-10-CM | POA: Diagnosis not present

## 2021-04-26 DIAGNOSIS — N186 End stage renal disease: Secondary | ICD-10-CM | POA: Diagnosis not present

## 2021-04-26 DIAGNOSIS — Z992 Dependence on renal dialysis: Secondary | ICD-10-CM | POA: Diagnosis not present

## 2021-04-27 DIAGNOSIS — Z992 Dependence on renal dialysis: Secondary | ICD-10-CM | POA: Diagnosis not present

## 2021-04-27 DIAGNOSIS — R0981 Nasal congestion: Secondary | ICD-10-CM | POA: Diagnosis not present

## 2021-04-27 DIAGNOSIS — J342 Deviated nasal septum: Secondary | ICD-10-CM | POA: Diagnosis not present

## 2021-04-27 DIAGNOSIS — N186 End stage renal disease: Secondary | ICD-10-CM | POA: Diagnosis not present

## 2021-04-27 DIAGNOSIS — J3489 Other specified disorders of nose and nasal sinuses: Secondary | ICD-10-CM | POA: Diagnosis not present

## 2021-04-28 DIAGNOSIS — N186 End stage renal disease: Secondary | ICD-10-CM | POA: Diagnosis not present

## 2021-04-28 DIAGNOSIS — Z992 Dependence on renal dialysis: Secondary | ICD-10-CM | POA: Diagnosis not present

## 2021-04-28 DIAGNOSIS — N2581 Secondary hyperparathyroidism of renal origin: Secondary | ICD-10-CM | POA: Diagnosis not present

## 2021-04-29 IMAGING — CT CT ABD-PELV W/O CM
2 of 5 series · 14 of 46 positions shown, 16 images · non-contrast
Comparison: 01/16/2011

CLINICAL DATA: Abdominal pain, distention, and fatigue. Chronic
renal failure.

EXAM:
CT ABDOMEN AND PELVIS WITHOUT CONTRAST
TECHNIQUE: Multidetector CT imaging of the abdomen and pelvis was performed
following the standard protocol without IV contrast.

[Series 11: routine abdomen pelvis without 5.00 br40 s3 axial · axial · non-contrast · 0.71mm/px · z∈[+1200,+1610]mm · 11 of 96 slices shown, 13 images]
[im 9/96  soft-tissue]
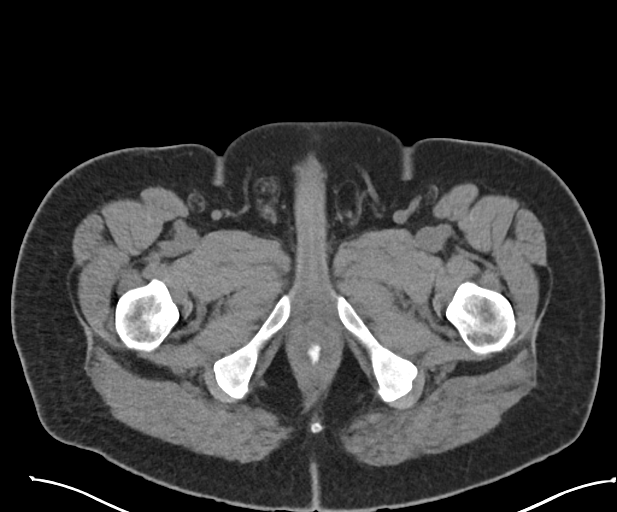
[im 9/96  bone]
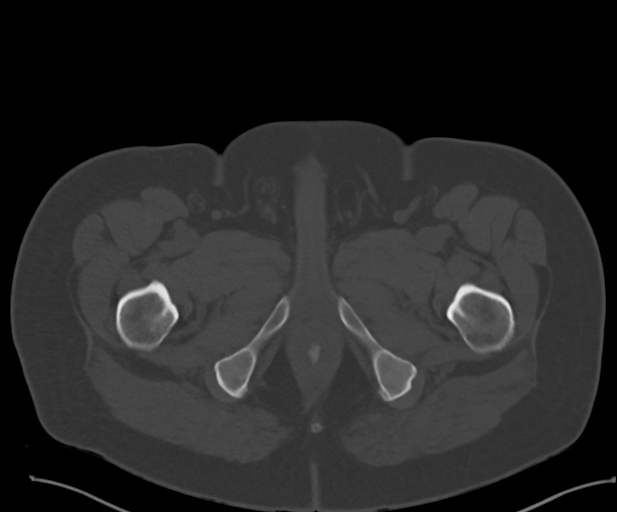
[im 17/96  soft-tissue]
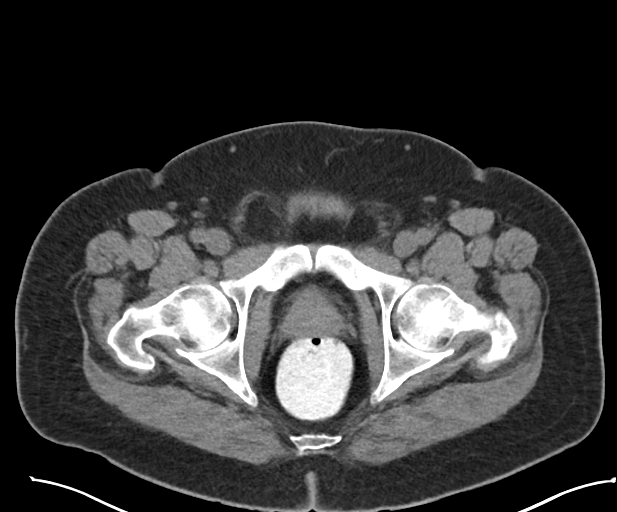
[im 25/96  soft-tissue]
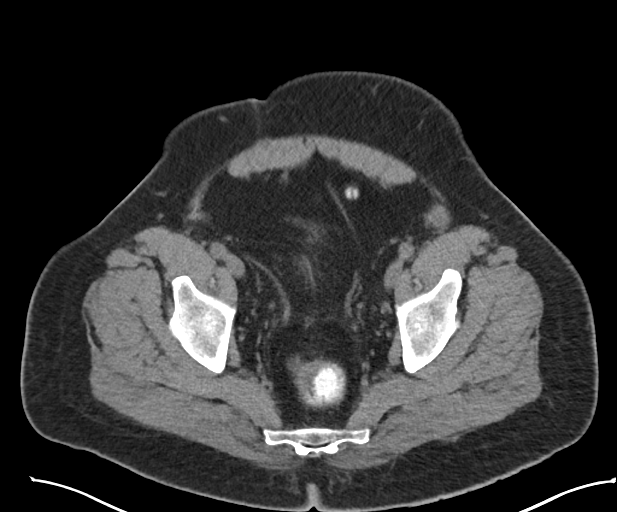
[im 34/96  soft-tissue]
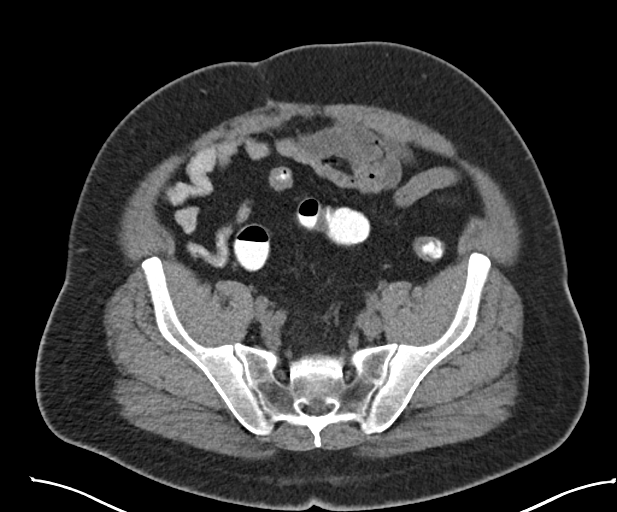
[im 42/96  soft-tissue]
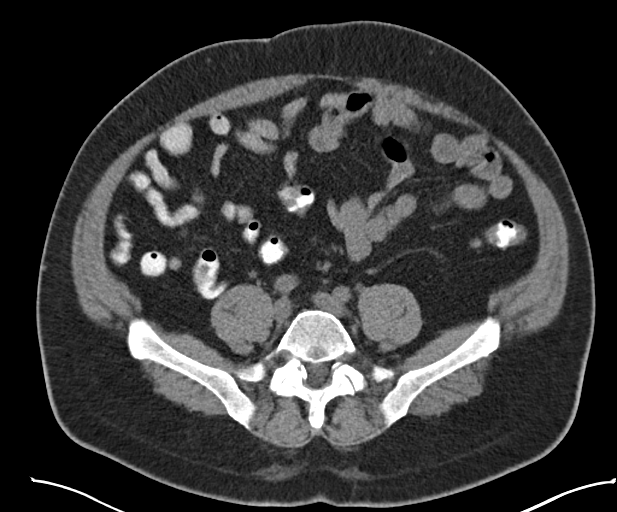
[im 50/96  soft-tissue]
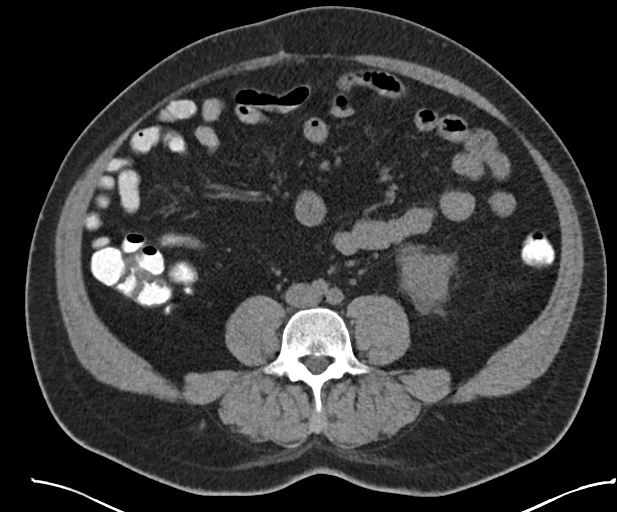
[im 58/96  soft-tissue]
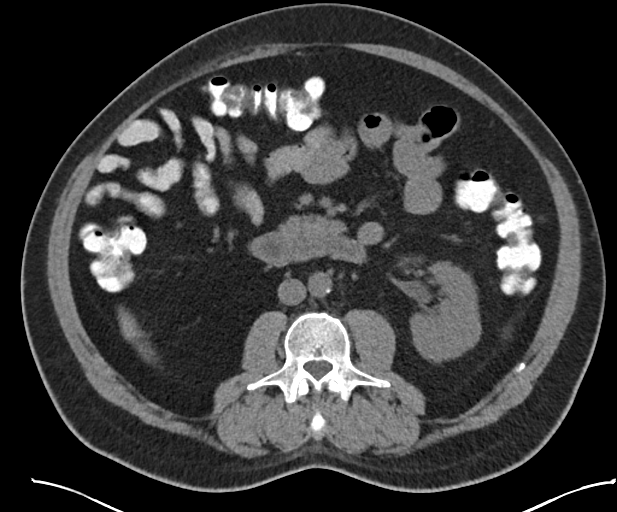
[im 67/96  soft-tissue]
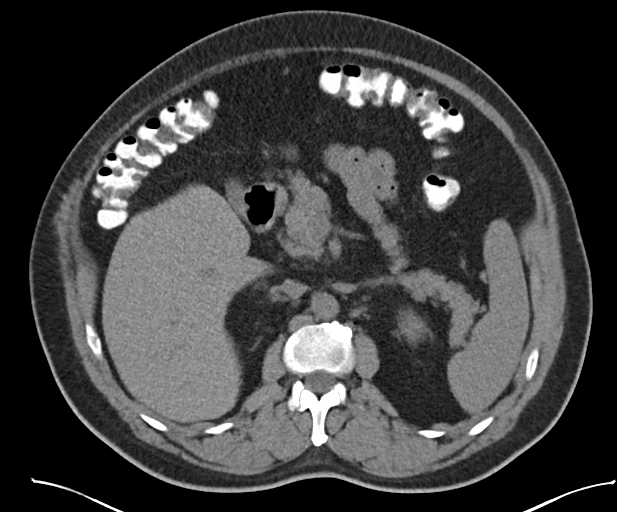
[im 75/96  soft-tissue]
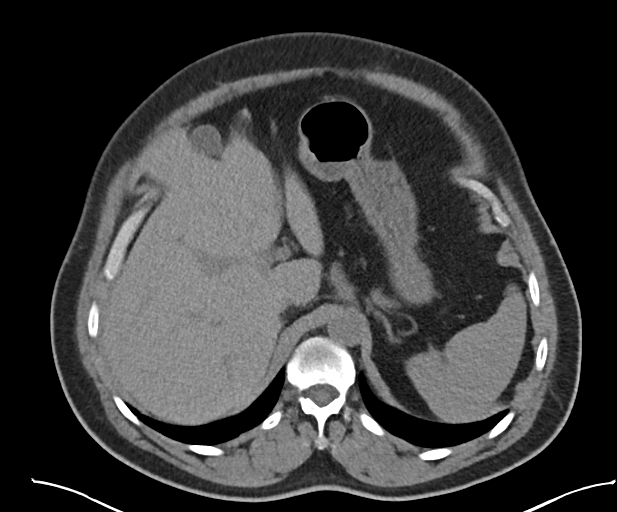
[im 75/96  bone]
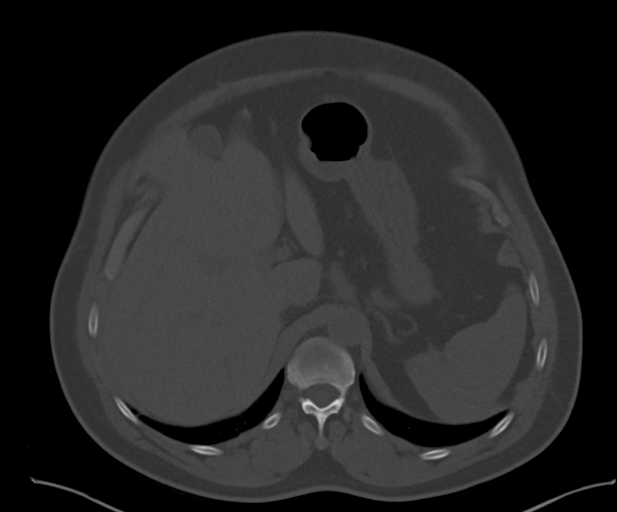
[im 83/96  soft-tissue]
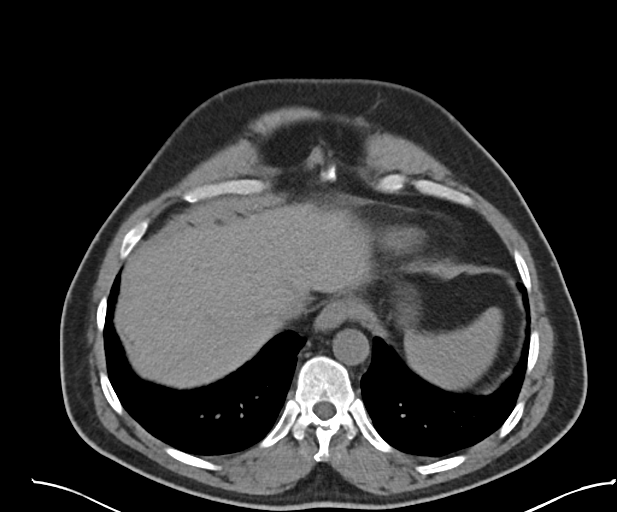
[im 91/96  soft-tissue]
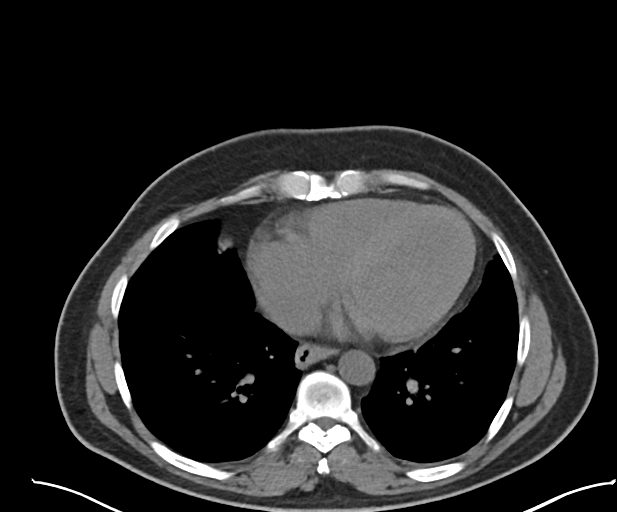

[Series 13: routine abdomen pelvis without 2.00 br40 s3 cor · coronal · non-contrast · 0.86mm/px · 3 of 183 slices shown]
[im 61/183  soft-tissue]
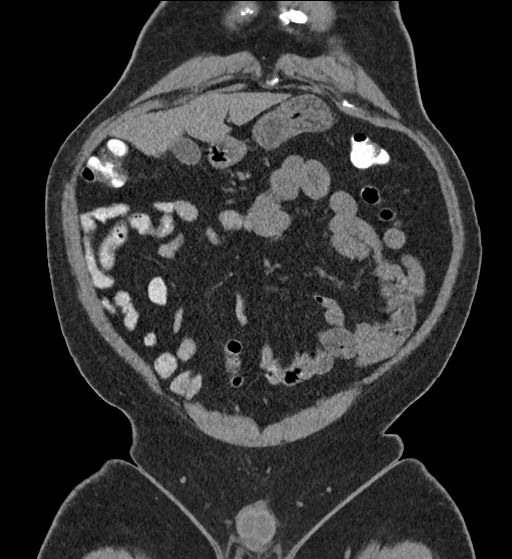
[im 81/183  soft-tissue]
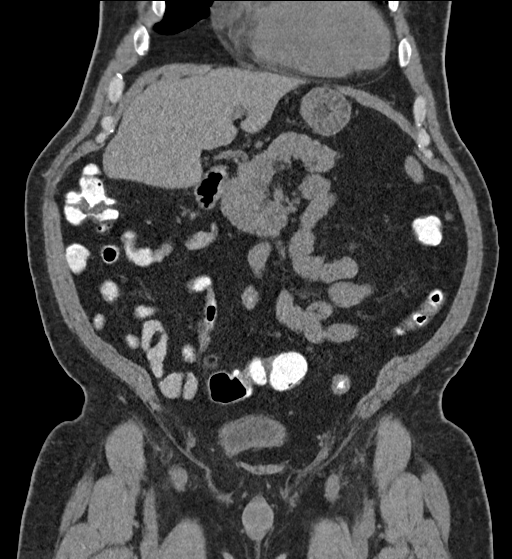
[im 102/183  soft-tissue]
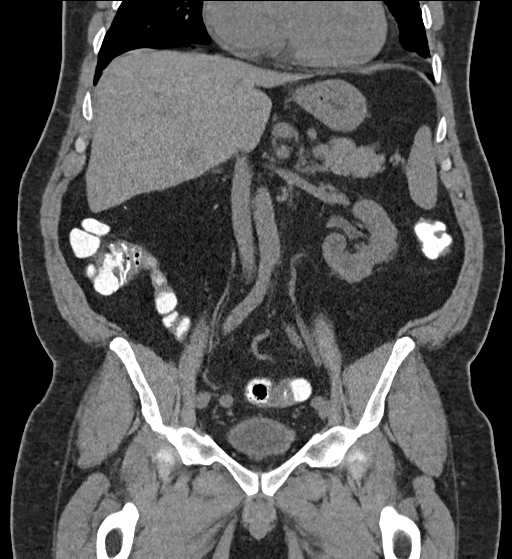

[14 of 46 positions shown; findings below may reference images not displayed]

FINDINGS: Lower chest: No acute findings.

Hepatobiliary: No mass visualized on this unenhanced exam.
Gallbladder is unremarkable. No evidence of biliary ductal
dilatation.

Pancreas: No mass or inflammatory process visualized on this
unenhanced exam.

Spleen:  Within normal limits in size.

Adrenals/Urinary tract: Normal adrenal glands. Previous right
nephrectomy. No evidence of left renal calculi or hydronephrosis.
Unremarkable unopacified urinary bladder.

Stomach/Bowel: No evidence of obstruction, inflammatory process, or
abnormal fluid collections. Normal appendix visualized.

Vascular/Lymphatic: No pathologically enlarged lymph nodes
identified. No evidence of abdominal aortic aneurysm.

Reproductive:  No mass or other significant abnormality.

Other:  Small right inguinal hernia is seen containing only fat.

Musculoskeletal:  No suspicious bone lesions identified.
IMPRESSION: 1. Prior right nephrectomy. No evidence of urolithiasis,
hydronephrosis, or other acute findings.
2. Small right inguinal hernia containing only fat.

## 2021-04-30 DIAGNOSIS — N2581 Secondary hyperparathyroidism of renal origin: Secondary | ICD-10-CM | POA: Diagnosis not present

## 2021-04-30 DIAGNOSIS — N186 End stage renal disease: Secondary | ICD-10-CM | POA: Diagnosis not present

## 2021-04-30 DIAGNOSIS — Z992 Dependence on renal dialysis: Secondary | ICD-10-CM | POA: Diagnosis not present

## 2021-05-03 DIAGNOSIS — D631 Anemia in chronic kidney disease: Secondary | ICD-10-CM | POA: Diagnosis not present

## 2021-05-03 DIAGNOSIS — Z992 Dependence on renal dialysis: Secondary | ICD-10-CM | POA: Diagnosis not present

## 2021-05-03 DIAGNOSIS — N186 End stage renal disease: Secondary | ICD-10-CM | POA: Diagnosis not present

## 2021-05-03 DIAGNOSIS — N2581 Secondary hyperparathyroidism of renal origin: Secondary | ICD-10-CM | POA: Diagnosis not present

## 2021-05-05 DIAGNOSIS — Z992 Dependence on renal dialysis: Secondary | ICD-10-CM | POA: Diagnosis not present

## 2021-05-05 DIAGNOSIS — N186 End stage renal disease: Secondary | ICD-10-CM | POA: Diagnosis not present

## 2021-05-05 DIAGNOSIS — D631 Anemia in chronic kidney disease: Secondary | ICD-10-CM | POA: Diagnosis not present

## 2021-05-05 DIAGNOSIS — N2581 Secondary hyperparathyroidism of renal origin: Secondary | ICD-10-CM | POA: Diagnosis not present

## 2021-05-07 DIAGNOSIS — N2581 Secondary hyperparathyroidism of renal origin: Secondary | ICD-10-CM | POA: Diagnosis not present

## 2021-05-07 DIAGNOSIS — D631 Anemia in chronic kidney disease: Secondary | ICD-10-CM | POA: Diagnosis not present

## 2021-05-07 DIAGNOSIS — Z992 Dependence on renal dialysis: Secondary | ICD-10-CM | POA: Diagnosis not present

## 2021-05-07 DIAGNOSIS — N186 End stage renal disease: Secondary | ICD-10-CM | POA: Diagnosis not present

## 2021-05-10 DIAGNOSIS — N2581 Secondary hyperparathyroidism of renal origin: Secondary | ICD-10-CM | POA: Diagnosis not present

## 2021-05-10 DIAGNOSIS — Z992 Dependence on renal dialysis: Secondary | ICD-10-CM | POA: Diagnosis not present

## 2021-05-10 DIAGNOSIS — N186 End stage renal disease: Secondary | ICD-10-CM | POA: Diagnosis not present

## 2021-05-12 DIAGNOSIS — N2581 Secondary hyperparathyroidism of renal origin: Secondary | ICD-10-CM | POA: Diagnosis not present

## 2021-05-12 DIAGNOSIS — Z992 Dependence on renal dialysis: Secondary | ICD-10-CM | POA: Diagnosis not present

## 2021-05-12 DIAGNOSIS — N186 End stage renal disease: Secondary | ICD-10-CM | POA: Diagnosis not present

## 2021-05-14 DIAGNOSIS — I129 Hypertensive chronic kidney disease with stage 1 through stage 4 chronic kidney disease, or unspecified chronic kidney disease: Secondary | ICD-10-CM | POA: Diagnosis not present

## 2021-05-14 DIAGNOSIS — N186 End stage renal disease: Secondary | ICD-10-CM | POA: Diagnosis not present

## 2021-05-14 DIAGNOSIS — N2581 Secondary hyperparathyroidism of renal origin: Secondary | ICD-10-CM | POA: Diagnosis not present

## 2021-05-14 DIAGNOSIS — Z992 Dependence on renal dialysis: Secondary | ICD-10-CM | POA: Diagnosis not present

## 2021-05-17 ENCOUNTER — Other Ambulatory Visit: Payer: Self-pay | Admitting: Internal Medicine

## 2021-05-17 DIAGNOSIS — N186 End stage renal disease: Secondary | ICD-10-CM | POA: Diagnosis not present

## 2021-05-17 DIAGNOSIS — N2581 Secondary hyperparathyroidism of renal origin: Secondary | ICD-10-CM | POA: Diagnosis not present

## 2021-05-17 DIAGNOSIS — Z992 Dependence on renal dialysis: Secondary | ICD-10-CM | POA: Diagnosis not present

## 2021-05-19 DIAGNOSIS — N186 End stage renal disease: Secondary | ICD-10-CM | POA: Diagnosis not present

## 2021-05-19 DIAGNOSIS — N2581 Secondary hyperparathyroidism of renal origin: Secondary | ICD-10-CM | POA: Diagnosis not present

## 2021-05-19 DIAGNOSIS — Z992 Dependence on renal dialysis: Secondary | ICD-10-CM | POA: Diagnosis not present

## 2021-05-21 DIAGNOSIS — N2581 Secondary hyperparathyroidism of renal origin: Secondary | ICD-10-CM | POA: Diagnosis not present

## 2021-05-21 DIAGNOSIS — N186 End stage renal disease: Secondary | ICD-10-CM | POA: Diagnosis not present

## 2021-05-21 DIAGNOSIS — Z992 Dependence on renal dialysis: Secondary | ICD-10-CM | POA: Diagnosis not present

## 2021-05-24 DIAGNOSIS — N2581 Secondary hyperparathyroidism of renal origin: Secondary | ICD-10-CM | POA: Diagnosis not present

## 2021-05-24 DIAGNOSIS — Z992 Dependence on renal dialysis: Secondary | ICD-10-CM | POA: Diagnosis not present

## 2021-05-24 DIAGNOSIS — D631 Anemia in chronic kidney disease: Secondary | ICD-10-CM | POA: Diagnosis not present

## 2021-05-24 DIAGNOSIS — N186 End stage renal disease: Secondary | ICD-10-CM | POA: Diagnosis not present

## 2021-05-26 DIAGNOSIS — Z992 Dependence on renal dialysis: Secondary | ICD-10-CM | POA: Diagnosis not present

## 2021-05-26 DIAGNOSIS — D631 Anemia in chronic kidney disease: Secondary | ICD-10-CM | POA: Diagnosis not present

## 2021-05-26 DIAGNOSIS — N2581 Secondary hyperparathyroidism of renal origin: Secondary | ICD-10-CM | POA: Diagnosis not present

## 2021-05-26 DIAGNOSIS — N186 End stage renal disease: Secondary | ICD-10-CM | POA: Diagnosis not present

## 2021-05-28 DIAGNOSIS — Z992 Dependence on renal dialysis: Secondary | ICD-10-CM | POA: Diagnosis not present

## 2021-05-28 DIAGNOSIS — N186 End stage renal disease: Secondary | ICD-10-CM | POA: Diagnosis not present

## 2021-05-28 DIAGNOSIS — N2581 Secondary hyperparathyroidism of renal origin: Secondary | ICD-10-CM | POA: Diagnosis not present

## 2021-05-28 DIAGNOSIS — D631 Anemia in chronic kidney disease: Secondary | ICD-10-CM | POA: Diagnosis not present

## 2021-05-31 DIAGNOSIS — Z992 Dependence on renal dialysis: Secondary | ICD-10-CM | POA: Diagnosis not present

## 2021-05-31 DIAGNOSIS — N186 End stage renal disease: Secondary | ICD-10-CM | POA: Diagnosis not present

## 2021-05-31 DIAGNOSIS — N2581 Secondary hyperparathyroidism of renal origin: Secondary | ICD-10-CM | POA: Diagnosis not present

## 2021-06-02 DIAGNOSIS — Z992 Dependence on renal dialysis: Secondary | ICD-10-CM | POA: Diagnosis not present

## 2021-06-02 DIAGNOSIS — N2581 Secondary hyperparathyroidism of renal origin: Secondary | ICD-10-CM | POA: Diagnosis not present

## 2021-06-02 DIAGNOSIS — N186 End stage renal disease: Secondary | ICD-10-CM | POA: Diagnosis not present

## 2021-06-04 DIAGNOSIS — N2581 Secondary hyperparathyroidism of renal origin: Secondary | ICD-10-CM | POA: Diagnosis not present

## 2021-06-04 DIAGNOSIS — N186 End stage renal disease: Secondary | ICD-10-CM | POA: Diagnosis not present

## 2021-06-04 DIAGNOSIS — Z992 Dependence on renal dialysis: Secondary | ICD-10-CM | POA: Diagnosis not present

## 2021-06-13 DIAGNOSIS — N186 End stage renal disease: Secondary | ICD-10-CM | POA: Diagnosis not present

## 2021-06-13 DIAGNOSIS — I129 Hypertensive chronic kidney disease with stage 1 through stage 4 chronic kidney disease, or unspecified chronic kidney disease: Secondary | ICD-10-CM | POA: Diagnosis not present

## 2021-06-13 DIAGNOSIS — Z992 Dependence on renal dialysis: Secondary | ICD-10-CM | POA: Diagnosis not present

## 2021-07-20 ENCOUNTER — Other Ambulatory Visit: Payer: Self-pay | Admitting: Internal Medicine

## 2021-10-12 ENCOUNTER — Ambulatory Visit (HOSPITAL_BASED_OUTPATIENT_CLINIC_OR_DEPARTMENT_OTHER): Payer: Medicare HMO | Admitting: Internal Medicine

## 2021-11-02 ENCOUNTER — Encounter: Payer: Self-pay | Admitting: Internal Medicine

## 2021-11-07 ENCOUNTER — Observation Stay (HOSPITAL_COMMUNITY)
Admission: EM | Admit: 2021-11-07 | Discharge: 2021-11-09 | Disposition: A | Discharge status: Home / Self Care | Payer: Medicare HMO | Attending: Family Medicine | Admitting: Family Medicine

## 2021-11-07 ENCOUNTER — Other Ambulatory Visit: Payer: Self-pay

## 2021-11-07 ENCOUNTER — Encounter (HOSPITAL_COMMUNITY): Payer: Self-pay | Admitting: Emergency Medicine

## 2021-11-07 DIAGNOSIS — Z992 Dependence on renal dialysis: Secondary | ICD-10-CM | POA: Diagnosis not present

## 2021-11-07 DIAGNOSIS — E875 Hyperkalemia: Secondary | ICD-10-CM | POA: Insufficient documentation

## 2021-11-07 DIAGNOSIS — I150 Renovascular hypertension: Secondary | ICD-10-CM | POA: Diagnosis not present

## 2021-11-07 DIAGNOSIS — N186 End stage renal disease: Secondary | ICD-10-CM | POA: Diagnosis not present

## 2021-11-07 DIAGNOSIS — I120 Hypertensive chronic kidney disease with stage 5 chronic kidney disease or end stage renal disease: Secondary | ICD-10-CM | POA: Diagnosis not present

## 2021-11-07 DIAGNOSIS — Z79899 Other long term (current) drug therapy: Secondary | ICD-10-CM | POA: Insufficient documentation

## 2021-11-07 DIAGNOSIS — E785 Hyperlipidemia, unspecified: Secondary | ICD-10-CM | POA: Diagnosis present

## 2021-11-07 DIAGNOSIS — E669 Obesity, unspecified: Secondary | ICD-10-CM | POA: Diagnosis present

## 2021-11-07 LAB — CBC WITH DIFFERENTIAL/PLATELET
Abs Immature Granulocytes: 0.03 10*3/uL (ref 0.00–0.07)
Basophils Absolute: 0 10*3/uL (ref 0.0–0.1)
Basophils Relative: 1 %
Eosinophils Absolute: 0.1 10*3/uL (ref 0.0–0.5)
Eosinophils Relative: 2 %
HCT: 29.1 % — ABNORMAL LOW (ref 39.0–52.0)
Hemoglobin: 9.4 g/dL — ABNORMAL LOW (ref 13.0–17.0)
Immature Granulocytes: 1 %
Lymphocytes Relative: 23 %
Lymphs Abs: 1.3 10*3/uL (ref 0.7–4.0)
MCH: 29.8 pg (ref 26.0–34.0)
MCHC: 32.3 g/dL (ref 30.0–36.0)
MCV: 92.4 fL (ref 80.0–100.0)
Monocytes Absolute: 0.5 10*3/uL (ref 0.1–1.0)
Monocytes Relative: 10 %
Neutro Abs: 3.4 10*3/uL (ref 1.7–7.7)
Neutrophils Relative %: 63 %
Platelets: 180 10*3/uL (ref 150–400)
RBC: 3.15 MIL/uL — ABNORMAL LOW (ref 4.22–5.81)
RDW: 13.2 % (ref 11.5–15.5)
WBC: 5.4 10*3/uL (ref 4.0–10.5)
nRBC: 0 % (ref 0.0–0.2)

## 2021-11-07 LAB — COMPREHENSIVE METABOLIC PANEL
ALT: 18 U/L (ref 0–44)
AST: 12 U/L — ABNORMAL LOW (ref 15–41)
Albumin: 3.6 g/dL (ref 3.5–5.0)
Alkaline Phosphatase: 44 U/L (ref 38–126)
Anion gap: 15 (ref 5–15)
BUN: 67 mg/dL — ABNORMAL HIGH (ref 6–20)
CO2: 20 mmol/L — ABNORMAL LOW (ref 22–32)
Calcium: 7.6 mg/dL — ABNORMAL LOW (ref 8.9–10.3)
Chloride: 103 mmol/L (ref 98–111)
Creatinine, Ser: 13.21 mg/dL — ABNORMAL HIGH (ref 0.61–1.24)
GFR, Estimated: 4 mL/min — ABNORMAL LOW (ref >60)
Glucose, Bld: 106 mg/dL — ABNORMAL HIGH (ref 70–99)
Potassium: 5.4 mmol/L — ABNORMAL HIGH (ref 3.5–5.1)
Sodium: 138 mmol/L (ref 135–145)
Total Bilirubin: 0.4 mg/dL (ref 0.3–1.2)
Total Protein: 6.1 g/dL — ABNORMAL LOW (ref 6.5–8.1)

## 2021-11-07 LAB — HIV ANTIBODY (ROUTINE TESTING W REFLEX): HIV Screen 4th Generation wRfx: NONREACTIVE

## 2021-11-07 LAB — HEPATITIS B SURFACE ANTIBODY,QUALITATIVE: Hep B S Ab: REACTIVE — AB

## 2021-11-07 LAB — HEPATITIS B CORE ANTIBODY, TOTAL: Hep B Core Total Ab: NONREACTIVE

## 2021-11-07 LAB — HEPATITIS C ANTIBODY: HCV Ab: NONREACTIVE

## 2021-11-07 LAB — HEPATITIS B SURFACE ANTIGEN: Hepatitis B Surface Ag: NONREACTIVE

## 2021-11-07 MED ORDER — HYDRALAZINE HCL 25 MG PO TABS
25.0000 mg | ORAL_TABLET | Freq: Three times a day (TID) | ORAL | Status: DC
  Administered 2021-11-07 – 2021-11-09 (×6): 25 mg via ORAL
  Filled 2021-11-07 (×7): qty 1

## 2021-11-07 MED ORDER — HYDROXYZINE HCL 25 MG PO TABS: 25.0000 mg | ORAL_TABLET | Freq: Three times a day (TID) | ORAL | Status: DC | PRN

## 2021-11-07 MED ORDER — ONDANSETRON HCL 4 MG PO TABS: 4.0000 mg | ORAL_TABLET | Freq: Four times a day (QID) | ORAL | Status: DC | PRN

## 2021-11-07 MED ORDER — PATIROMER SORBITEX CALCIUM 8.4 G PO PACK
8.4000 g | PACK | Freq: Every day | ORAL | Status: AC
  Administered 2021-11-08: 8.4 g via ORAL
  Filled 2021-11-07 (×2): qty 1

## 2021-11-07 MED ORDER — DOCUSATE SODIUM 283 MG RE ENEM: 1.0000 | ENEMA | RECTAL | Status: DC | PRN

## 2021-11-07 MED ORDER — CARVEDILOL 25 MG PO TABS
25.0000 mg | ORAL_TABLET | Freq: Two times a day (BID) | ORAL | Status: DC
  Administered 2021-11-07 – 2021-11-09 (×4): 25 mg via ORAL
  Filled 2021-11-07 (×2): qty 1
  Filled 2021-11-07: qty 2
  Filled 2021-11-07: qty 1

## 2021-11-07 MED ORDER — ZOLPIDEM TARTRATE 5 MG PO TABS
5.0000 mg | ORAL_TABLET | Freq: Every evening | ORAL | Status: DC | PRN
Start: 2021-11-07 — End: 2021-11-09

## 2021-11-07 MED ORDER — NEPRO/CARBSTEADY PO LIQD: 237.0000 mL | Freq: Three times a day (TID) | ORAL | Status: DC | PRN

## 2021-11-07 MED ORDER — ONDANSETRON HCL 4 MG/2ML IJ SOLN: 4.0000 mg | Freq: Four times a day (QID) | INTRAMUSCULAR | Status: DC | PRN

## 2021-11-07 MED ORDER — CALCIUM CARBONATE ANTACID 1250 MG/5ML PO SUSP: 500.0000 mg | Freq: Four times a day (QID) | ORAL | Status: DC | PRN

## 2021-11-07 MED ORDER — HYDRALAZINE HCL 20 MG/ML IJ SOLN: 5.0000 mg | INTRAMUSCULAR | Status: DC | PRN

## 2021-11-07 MED ORDER — ACETAMINOPHEN 650 MG RE SUPP: 650.0000 mg | Freq: Four times a day (QID) | RECTAL | Status: DC | PRN

## 2021-11-07 MED ORDER — AMLODIPINE BESYLATE 5 MG PO TABS
5.0000 mg | ORAL_TABLET | Freq: Every day | ORAL | Status: DC
  Administered 2021-11-08 – 2021-11-09 (×2): 5 mg via ORAL
  Filled 2021-11-07 (×2): qty 1

## 2021-11-07 MED ORDER — ACETAMINOPHEN 325 MG PO TABS
650.0000 mg | ORAL_TABLET | Freq: Four times a day (QID) | ORAL | Status: DC | PRN
  Administered 2021-11-08 (×2): 650 mg via ORAL
  Filled 2021-11-07 (×2): qty 2

## 2021-11-07 MED ORDER — ADULT MULTIVITAMIN W/MINERALS CH
1.0000 | ORAL_TABLET | Freq: Every day | ORAL | Status: DC
  Administered 2021-11-08 – 2021-11-09 (×2): 1 via ORAL
  Filled 2021-11-07 (×2): qty 1

## 2021-11-07 MED ORDER — SODIUM ZIRCONIUM CYCLOSILICATE 10 G PO PACK: 10.0000 g | PACK | Freq: Once | ORAL | Status: DC

## 2021-11-07 MED ORDER — CALCIUM ACETATE (PHOS BINDER) 667 MG PO CAPS
1334.0000 mg | ORAL_CAPSULE | Freq: Three times a day (TID) | ORAL | Status: DC
  Administered 2021-11-08 – 2021-11-09 (×5): 1334 mg via ORAL
  Filled 2021-11-07 (×5): qty 2

## 2021-11-07 MED ORDER — CHLORHEXIDINE GLUCONATE CLOTH 2 % EX PADS
6.0000 | MEDICATED_PAD | Freq: Every day | CUTANEOUS | Status: DC
  Administered 2021-11-08 – 2021-11-09 (×2): 6 via TOPICAL

## 2021-11-07 MED ORDER — PANTOPRAZOLE SODIUM 40 MG PO TBEC
40.0000 mg | DELAYED_RELEASE_TABLET | Freq: Every day | ORAL | Status: DC
  Administered 2021-11-08 – 2021-11-09 (×2): 40 mg via ORAL
  Filled 2021-11-07 (×2): qty 1

## 2021-11-07 MED ORDER — SORBITOL 70 % SOLN: 30.0000 mL | Status: DC | PRN

## 2021-11-07 MED ORDER — CAMPHOR-MENTHOL 0.5-0.5 % EX LOTN: 1.0000 | TOPICAL_LOTION | Freq: Three times a day (TID) | CUTANEOUS | Status: DC | PRN

## 2021-11-07 NOTE — Consult Note (Addendum)
**Note Andrew-Identified via Obfuscation** Renal Service Consult Note [name]  [name] 11/07/2021 Andrew Blazing, [name] Requesting Physician: Dr. Lorin Barnett  Reason for Consult: ESRD pt w/ need for dialysis HPI: The patient is a 56 y.o. year-old w/ hx of anemia, ESRD on HD, HL, HTN, OSA, PUD who presented to ED after his dialysis unit told him he needed to go to the hospital to be seen by the kidney doctor so that he can be placed back in the dialysis system. His HD unit was [name] on [address]. We are asked to see the patient for dialysis.   Pt seen in ED.  Interviewed via interpreter, [place] and got his HD there approx 4 hrs per dialysis 3 times a week while away. His last HD was in [place] on Thursday 9/22. He does endorse some SOB when lying flat. No n/v/d, abd pain or CP, no SOB at rest. No edema/ swelling of the extremities. Pt c/o headaches that happen towards the end of dialysis and feeling fatigued after dialysis as well. No other c/o's. No current headache.   ROS - denies CP, no joint pain, no HA, no blurry vision, no rash, no diarrhea, no nausea/ vomiting, no dysuria, no difficulty voiding   Past Medical History  Past Medical History:  Diagnosis Date   Anemia    Back pain    CKD (chronic kidney disease), stage III (HCC)    Stage 4   Colon polyps    adenomatous   Elevated cholesterol    History of echocardiogram    Echo 11/17: EF 65-70, normal wall motion, grade 1 diastolic dysfunction, trivial MR, mild LAE, mild TR   Hyperlipidemia    Hypertension    no current bp meds for last 3 months   Kidney stones 2007   Medication intolerance    a. multiple with prior nonadherence to regimen.   Nausea    " in the morning since having the kidney problem "   Otosclerosis of both ears    Pneumonia    PONV (postoperative nausea and vomiting)    PUD (peptic ulcer disease)    Has had unspecified surgery for this   Sleep apnea    Past Surgical History  Past Surgical History:   Procedure Laterality Date   AV FISTULA PLACEMENT Left 01/04/2019   Procedure: ARTERIOVENOUS (AV) FISTULA CREATION LEFT ARM;  Surgeon: Andrew Heck, [name];  Location: [facility];  Service: Vascular;  Laterality: Left;   [place] Left 03/11/2019   Procedure: BASILIC VEIN TRANSPOSITION 2ND STAGE LEFT;  Surgeon: Andrew Heck, [name];  Location: [facility];  Service: Vascular;  Laterality: Left;   NEPHRECTOMY  02/18/2011   Procedure: NEPHRECTOMY;  Surgeon: Andrew Ben, [name];  Location: [facility];  Service: Urology;  Laterality: Right;   SMALL INTESTINE SURGERY     STAPEDOTOMY  2005   lt ear jan, right ear sept   surgery for ulcers  1990   Family History  Family History  Problem Relation Age of Onset   Hyperlipidemia Father    Heart disease Mother    Hypertension Mother    Hypertension Sister    Heart disease Brother 19       CABG   Hyperlipidemia Sister    Hyperlipidemia Sister    Esophageal cancer Cousin    Healthy Child    Colon cancer Neg Hx    Stomach cancer Neg Hx    Rectal cancer Neg Hx  Social History  reports that he has never smoked. He has never used smokeless tobacco. He reports that he does not drink alcohol and does not use drugs. Allergies  Allergies  Allergen Reactions   Lokelma [Sodium Zirconium Cyclosilicate] Shortness Of Breath   Beef-Derived Products Other (See Comments)    Cultural preference   Pegademase Bovine Other (See Comments)    Cultural preference   Poractant Alfa Other (See Comments)    Cultural preference   Pork-Derived Products Other (See Comments)    Cultural preference   Home medications Prior to Admission medications   Medication Sig Start Date End Date Taking? Authorizing Provider  amLODipine (NORVASC) 5 MG tablet Take 1 tablet (5 mg total) by mouth daily. 04/18/19 11/07/21 Yes Andrew Friendly, [name]  B Complex-C-Zn-Folic Acid (DIALYVITE 419 WITH ZINC) 0.8 MG TABS Take 1 tablet by mouth daily. 06/22/20  Yes Provider,  Historical, [name]  benzoyl peroxide (BENZOYL PEROXIDE) 5 % external liquid Apply topically 2 (two) times daily. 12/30/19  Yes [name], Andrew Bloomer, [name]  Betamethasone Sodium Phosphate 6 MG/ML SOLN INJECT 2:4 SYRINGE BILATERALLY INTO [facility] KNEE JOINT 06/21/19  Yes Provider, Historical, [name]  calcium acetate (PHOSLO) 667 MG capsule Take 2 capsules (1,334 mg total) by mouth 3 (three) times daily with meals. 12/30/19  Yes Andrew Pollen, [name]  carvedilol (COREG) 25 MG tablet Take 1 tablet (25 mg total) by mouth 2 (two) times daily with a meal. 04/17/19 11/07/21 Yes Andrew Friendly, [name]  hydrALAZINE (APRESOLINE) 25 MG tablet Take 1 tablet (25 mg total) by mouth every 8 (eight) hours. 04/17/19 11/07/21 Yes Andrew Friendly, [name]  lidocaine-prilocaine (EMLA) cream Apply topically. 06/11/19  Yes Provider, Historical, [name]  pantoprazole (PROTONIX) 40 MG tablet Take 1 tablet (40 mg total) by mouth daily. 04/17/19  Yes Andrew Friendly, [name]  VASCEPA 1 g capsule Take 2 capsules (2 g total) by mouth 2 (two) times daily. Must keep appointment for further refills 07/21/21  Yes [name], Andrew Corwin, [name]  ondansetron (ZOFRAN) 4 MG tablet Take 1 tablet (4 mg total) by mouth every 8 (eight) hours as needed for up to 15 doses for nausea or vomiting. Patient not taking: Reported on 11/07/2021 01/10/21   Andrew Dusky, [name]  Rimegepant Sulfate (NURTEC) 75 MG TBDP Take 75 mg by mouth as needed. Maxium 1 tab in 24 hours Patient not taking: Reported on 11/07/2021 03/11/21   Andrew Partridge, [name]     Vitals:   11/07/21 1301 11/07/21 1750 11/07/21 1759 11/07/21 1815  BP: (!) 155/82 (!) 171/99  (!) 175/154  Pulse: 82  84 80  Resp: 20   17  Temp: 99.5 F (37.5 C)  98 F (36.7 C)   TempSrc: Oral  Oral   SpO2: 95%  96% 100%   Exam Gen alert, no distress No rash, cyanosis or gangrene Sclera anicteric, throat clear  No jvd or bruits Chest occ light rales bilat bases, no wheezing, no ^wob RRR no MRG Abd soft ntnd no mass or ascites  +bs GU normal male MS no joint effusions or deformity Ext no LE or UE edema, no wounds or ulcers Neuro is alert, Ox 3 , nf    LUA AVF+bruit   Home meds include - amlodipine 5, calcium acetate 2 ac, coreg 25 bid, hydralazine 25 tid, pantoprazole, vascepa, rimegepant, prns/ vits/ supps   OP HD: no data in Provider Hub on this patient     171 /99  HR 80 RR 18  temp 98  96% RA       Na 138  K 5.4  CO2 20  BUN 67  creat 13    Alb 3.6  LFT's wnl  Hb 9.4  WBC 5K     Heparin cultural preference - no pork products - in Epic   Lokelma allergy - in EPIC    Assessment/ Plan: ESRD - pt was out patient at [facility] but has been getting HD overseas for the last 3 mos and upon returning to the [place], we see that he is no longer a HD patient is in our system. He is not grossly underdialyzed, does not appear uremic. May have some mild vol overload. Recommend HD 1st shift in the morning tomorrow and will consult our SW tomorrow as well. This admission / consultation and the attached labs will serve as a "new patient" exam/ H&P so that he should be able to be replaced in our HD system, and hopefully at his former unit, w/o too much delay.  BP/ vol - BP's a bit high, resume usual medications he is taking at home. UF goal 3 L w/ HD tomorrow.  Anemia esrd - Hb 9.4, no esa needed here.  MBD ckd - CCa a bit low. Cont phoslo 2 ac tid. Get phos as add-on.  Hyperkalemia - mild, give veltassa 10 gm x 1 tonight, 1 K bath 45 min w/ HD Monday.  Headaches - not current problem but patient is concerned about dialysis- assoc headaches. Consider OP referral to a neurologist.       Kelly Splinter  [name] 11/07/2021, 7:17 PM Recent Labs  Lab 11/07/21 1320  HGB 9.4*  ALBUMIN 3.6  CALCIUM 7.6*  CREATININE 13.21*  K 5.4*   Inpatient medications:  [START ON 11/08/2021] amLODipine  5 mg Oral Daily   [START ON 11/08/2021] calcium acetate  1,334 mg Oral TID WC   carvedilol  25 mg Oral BID WC   hydrALAZINE  25 mg Oral Q8H   [START ON  11/08/2021] multivitamin with minerals  1 tablet Oral Daily   [START ON 11/08/2021] pantoprazole  40 mg Oral Daily    acetaminophen **OR** acetaminophen, calcium carbonate (dosed in mg elemental calcium), camphor-menthol **AND** hydrOXYzine, docusate sodium, feeding supplement (NEPRO CARB STEADY), hydrALAZINE, ondansetron **OR** ondansetron (ZOFRAN) IV, sorbitol, zolpidem

## 2021-11-07 NOTE — ED Triage Notes (Signed)
Patient stating he is here because he is leaving to go to his home country for 5 months and needs one last session before going. Patient goes to dialysis Tue/ Thursday and Sunday. He called his dialysis center and they told him to come to the hospital to receive dialysis. Patient has no other complaints at this time.

## 2021-11-07 NOTE — H&P (Signed)
History and Physical    Patient: [name] PXT:[id number] DOB: Oct 14, 1965 DOA: 11/07/2021 DOS: the patient was seen and examined on 11/07/2021 PCP: Horald Pollen, MD  Patient coming from: Home - lives with wife and 4 children; NOK: Wife, Casimer Lanius, [phone]   Chief Complaint: Needs HD  HPI: [name] is a 56 y.o. male with medical history significant of ESRD on HD; HTN; and HLD presenting for HD.  He reports that he went on vacation for 3 months and is just returning.  He lost his chair while he was gone.  He has no complaints but the nephrologist told him that he would have to come to the hospital and stay overnight for HD and to be placed back into the system.  He reports that he was going 3 days per week for 4 hours per day while he was away, but he would like to talk to the nephrologist because maybe since he has been on HD for 3 years now he can decrease the frequency or time of his sessions.    ER Course:  Needs HD before he goes out of the country.  No complaints.       Review of Systems: As mentioned in the history of present illness. All other systems reviewed and are negative. Past Medical History:  Diagnosis Date   Anemia    Back pain    CKD (chronic kidney disease), stage III (HCC)    Stage 4   Colon polyps    adenomatous   Elevated cholesterol    History of echocardiogram    Echo 11/17: EF 65-70, normal wall motion, grade 1 diastolic dysfunction, trivial MR, mild LAE, mild TR   Hyperlipidemia    Hypertension    no current bp meds for last 3 months   Kidney stones 2007   Medication intolerance    a. multiple with prior nonadherence to regimen.   Nausea    " in the morning since having the kidney problem "   Otosclerosis of both ears    Pneumonia    PONV (postoperative nausea and vomiting)    PUD (peptic ulcer disease)    Has had unspecified surgery for this   Sleep apnea    Past Surgical History:  Procedure Laterality Date   AV FISTULA  PLACEMENT Left 01/04/2019   Procedure: ARTERIOVENOUS (AV) FISTULA CREATION LEFT ARM;  Surgeon: [name], MD;  Location: [facility];  Service: Vascular;  Laterality: Left;   [place] Left 03/11/2019   Procedure: BASILIC VEIN TRANSPOSITION 2ND STAGE LEFT;  Surgeon: [name], MD;  Location: [facility];  Service: Vascular;  Laterality: Left;   NEPHRECTOMY  02/18/2011   Procedure: NEPHRECTOMY;  Surgeon: Hanley Ben, MD;  Location: [facility];  Service: Urology;  Laterality: Right;   SMALL INTESTINE SURGERY     STAPEDOTOMY  2005   lt ear jan, right ear sept   surgery for ulcers  1990   Social History:  reports that he has never smoked. He has never used smokeless tobacco. He reports that he does not drink alcohol and does not use drugs.  Allergies  Allergen Reactions   Lokelma [Sodium Zirconium Cyclosilicate] Shortness Of Breath   Beef-Derived Products Other (See Comments)    Cultural preference   Pegademase Bovine Other (See Comments)    Cultural preference   Poractant Alfa Other (See Comments)    Cultural preference   Pork-Derived Products Other (See Comments)    Cultural preference  Family History  Problem Relation Age of Onset   Hyperlipidemia Father    Heart disease Mother    Hypertension Mother    Hypertension Sister    Heart disease Brother 88       CABG   Hyperlipidemia Sister    Hyperlipidemia Sister    Esophageal cancer Cousin    Healthy Child    Colon cancer Neg Hx    Stomach cancer Neg Hx    Rectal cancer Neg Hx     Prior to Admission medications   Medication Sig Start Date End Date Taking? Authorizing Provider  amLODipine (NORVASC) 5 MG tablet Take 1 tablet (5 mg total) by mouth daily. 04/18/19 09/15/20  Alma Friendly, MD  B Complex-C-Zn-Folic Acid (DIALYVITE 161 WITH ZINC) 0.8 MG TABS Take 1 tablet by mouth daily. 06/22/20   [provider]  benzoyl peroxide (BENZOYL PEROXIDE) 5 % external liquid Apply topically 2 (two)  times daily. 12/30/19   Horald Pollen, MD  Betamethasone Sodium Phosphate 6 MG/ML SOLN INJECT 2:4 SYRINGE BILATERALLY INTO [facility] KNEE JOINT 06/21/19   [provider]  calcium acetate (PHOSLO) 667 MG capsule Take 2 capsules (1,334 mg total) by mouth 3 (three) times daily with meals. 12/30/19   Horald Pollen, MD  carvedilol (COREG) 25 MG tablet Take 1 tablet (25 mg total) by mouth 2 (two) times daily with a meal. 04/17/19 09/15/20  Alma Friendly, MD  hydrALAZINE (APRESOLINE) 25 MG tablet Take 1 tablet (25 mg total) by mouth every 8 (eight) hours. 04/17/19 09/15/20  Alma Friendly, MD  Ibuprofen & Acetaminophen 600 & 500 MG TBPK Take 1 tablet by mouth every 8 (eight) hours as needed. Patient not taking: Reported on 03/23/2021 07/07/20   Horald Pollen, MD  lidocaine-prilocaine (EMLA) cream Apply topically. 06/11/19   [provider]  ondansetron (ZOFRAN) 4 MG tablet Take 1 tablet (4 mg total) by mouth every 8 (eight) hours as needed for up to 15 doses for nausea or vomiting. 01/10/21   Wyvonnia Dusky, MD  pantoprazole (PROTONIX) 40 MG tablet Take 1 tablet (40 mg total) by mouth daily. 04/17/19   Alma Friendly, MD  Rimegepant Sulfate (NURTEC) 75 MG TBDP Take 75 mg by mouth as needed. Maxium 1 tab in 24 hours 03/11/21   Metta Clines R, DO  VASCEPA 1 g capsule Take 2 capsules (2 g total) by mouth 2 (two) times daily. Must keep appointment for further refills 07/21/21   Pixie Casino, MD    Physical Exam: Vitals:   11/07/21 1301  BP: (!) 155/82  Pulse: 82  Resp: 20  Temp: 99.5 F (37.5 C)  TempSrc: Oral  SpO2: 95%   General:  Appears calm and comfortable and is in NAD Eyes:  PERRL, EOMI, normal lids, iris ENT:  grossly normal hearing, lips & tongue, mmm Neck:  no LAD, masses or thyromegaly Cardiovascular:  RRR, no m/r/g. No LE edema.  Respiratory:   CTA bilaterally with no wheezes/rales/rhonchi.  Normal respiratory effort. Abdomen:  soft, NT,  ND Skin:  no rash or induration seen on limited exam Musculoskeletal:  grossly normal tone BUE/BLE, good ROM, no bony abnormality Psychiatric:  grossly normal mood and affect, speech fluent and appropriate, AOx3 Neurologic:  CN 2-12 grossly intact, moves all extremities in coordinated fashion   Radiological Exams on Admission: Independently reviewed - see discussion in A/P where applicable  No results found.  EKG: not done   Labs on Admission: I  have personally reviewed the available labs and imaging studies at the time of the admission.  Pertinent labs:    K+ 5.4 BUN 67/Creatinine 13.21/GFR 4 WBC 5.4 Hgb 9.4   Assessment and Plan: Principal Problem:   End stage renal disease on dialysis [facility]) Active Problems:   Dyslipidemia   Obesity (BMI 30-39.9)   Renovascular hypertension    ESRD patient in need of HD -Patient has no complaints and is medically stable -However, after discussion with [name] he has to stay overnight and be dialyzed in the hospital in order to get a chair at an outpatient facility -Will observe -Nephrology prn order set utilized -He does not appear to be volume overloaded or otherwise in need of acute HD -Nephrology was notified that patient will need HD  -Continue Phoslo, Renavite  HTN -Continue amlodipine, carvedilol, hydralazine  HLD -Previously on Vascepa but likely needs to see cardiology to resume  Obesity -Current weight requested -Obesity is associated with higher morbidity/mortality risks -Weight loss should be encouraged    Advance Care Planning:   Code Status: Full Code   Consults: Nephrology  DVT Prophylaxis: SCDs  Family Communication: None present  Severity of Illness: The appropriate patient status for this patient is OBSERVATION. Observation status is judged to be reasonable and necessary in order to provide the required intensity of service to ensure the patient's safety. The patient's presenting symptoms,  physical exam findings, and initial radiographic and laboratory data in the context of their medical condition is felt to place them at decreased risk for further clinical deterioration. Furthermore, it is anticipated that the patient will be medically stable for discharge from the hospital within 2 midnights of admission.   Author: Karmen Bongo, MD 11/07/2021 5:56 PM  For on call review [email or web address].

## 2021-11-07 NOTE — ED Provider Notes (Addendum)
[facility] EMERGENCY DEPARTMENT Provider Note   CSN: [id number] Arrival date & time: 11/07/21  1235     History  Chief Complaint  Patient presents with   needs dialysis    [name] is a 56 y.o. male.  Patient is a 56 year old male who presents requesting dialysis.  He has a history of end-stage renal disease.  He recently went to his home country for 3[place]/2 months and came back this week.  He was receiving dialysis on Tuesday Thursday and Saturday in his home country.  He had been previously receiving dialysis at the same schedule here in [place] prior to leaving.  He last had dialysis on Thursday.  He called his dialysis center requesting dialysis yesterday and he was told that he would have to come to the emergency room.  He says he has a little bit of shortness of breath because he has not received his dialysis but otherwise does not have any complaints.  History is obtained through a video language line.  Patient speaks Arabic.       Home Medications Prior to Admission medications   Medication Sig Start Date End Date Taking? Authorizing Provider  amLODipine (NORVASC) 5 MG tablet Take 1 tablet (5 mg total) by mouth daily. 04/18/19 09/15/20  Alma Friendly, MD  B Complex-C-Zn-Folic Acid (DIALYVITE 300 WITH ZINC) 0.8 MG TABS Take 1 tablet by mouth daily. 06/22/20   [provider]  benzoyl peroxide (BENZOYL PEROXIDE) 5 % external liquid Apply topically 2 (two) times daily. 12/30/19   Horald Pollen, MD  Betamethasone Sodium Phosphate 6 MG/ML SOLN INJECT 2:4 SYRINGE BILATERALLY INTO [facility] KNEE JOINT 06/21/19   [provider]  calcium acetate (PHOSLO) 667 MG capsule Take 2 capsules (1,334 mg total) by mouth 3 (three) times daily with meals. 12/30/19   Horald Pollen, MD  carvedilol (COREG) 25 MG tablet Take 1 tablet (25 mg total) by mouth 2 (two) times daily with a meal. 04/17/19 09/15/20  Alma Friendly, MD  hydrALAZINE  (APRESOLINE) 25 MG tablet Take 1 tablet (25 mg total) by mouth every 8 (eight) hours. 04/17/19 09/15/20  Alma Friendly, MD  Ibuprofen & Acetaminophen 600 & 500 MG TBPK Take 1 tablet by mouth every 8 (eight) hours as needed. Patient not taking: Reported on 03/23/2021 07/07/20   Horald Pollen, MD  lidocaine-prilocaine (EMLA) cream Apply topically. 06/11/19   [provider]  ondansetron (ZOFRAN) 4 MG tablet Take 1 tablet (4 mg total) by mouth every 8 (eight) hours as needed for up to 15 doses for nausea or vomiting. 01/10/21   Wyvonnia Dusky, MD  pantoprazole (PROTONIX) 40 MG tablet Take 1 tablet (40 mg total) by mouth daily. 04/17/19   Alma Friendly, MD  Rimegepant Sulfate (NURTEC) 75 MG TBDP Take 75 mg by mouth as needed. Maxium 1 tab in 24 hours 03/11/21   Metta Clines R, DO  VASCEPA 1 g capsule Take 2 capsules (2 g total) by mouth 2 (two) times daily. Must keep appointment for further refills 07/21/21   Pixie Casino, MD      Allergies    Lokelma [sodium zirconium cyclosilicate], Beef-derived products, Pegademase bovine, Poractant alfa, and Pork-derived products    Review of Systems   Review of Systems  Constitutional:  Negative for chills, diaphoresis, fatigue and fever.  HENT:  Negative for congestion, rhinorrhea and sneezing.   Eyes: Negative.   Respiratory:  Positive for shortness of breath. Negative for  cough and chest tightness.   Cardiovascular:  Negative for chest pain and leg swelling.  Gastrointestinal:  Negative for abdominal pain, blood in stool, diarrhea, nausea and vomiting.  Genitourinary:  Negative for difficulty urinating, flank pain, frequency and hematuria.  Musculoskeletal:  Negative for arthralgias and back pain.  Skin:  Negative for rash.  Neurological:  Negative for dizziness, speech difficulty, weakness, numbness and headaches.    Physical Exam Updated Vital Signs BP (!) 155/82 (BP Location: Right Arm)   Pulse 82   Temp 99.5 F (37.5 C)  (Oral)   Resp 20   SpO2 95%  Physical Exam Constitutional:      Appearance: He is well-developed.  HENT:     Head: Normocephalic and atraumatic.  Eyes:     Pupils: Pupils are equal, round, and reactive to light.  Cardiovascular:     Rate and Rhythm: Normal rate and regular rhythm.     Heart sounds: Normal heart sounds.  Pulmonary:     Effort: Pulmonary effort is normal. No respiratory distress.     Breath sounds: Normal breath sounds. No wheezing or rales.  Chest:     Chest wall: No tenderness.  Abdominal:     General: Bowel sounds are normal.     Palpations: Abdomen is soft.     Tenderness: There is no abdominal tenderness. There is no guarding or rebound.  Musculoskeletal:        General: Normal range of motion.     Cervical back: Normal range of motion and neck supple.     Comments: Trace edema lower extremities bilaterally  Lymphadenopathy:     Cervical: No cervical adenopathy.  Skin:    General: Skin is warm and dry.     Findings: No rash.  Neurological:     Mental Status: He is alert and oriented to person, place, and time.     ED Results / Procedures / Treatments   Labs (all labs ordered are listed, but only abnormal results are displayed) Labs Reviewed  COMPREHENSIVE METABOLIC PANEL - Abnormal; Notable for the following components:      Result Value   Potassium 5.4 (*)    CO2 20 (*)    Glucose, Bld 106 (*)    BUN 67 (*)    Creatinine, Ser 13.21 (*)    Calcium 7.6 (*)    Total Protein 6.1 (*)    AST 12 (*)    GFR, Estimated 4 (*)    All other components within normal limits  CBC WITH DIFFERENTIAL/PLATELET - Abnormal; Notable for the following components:   RBC 3.15 (*)    Hemoglobin 9.4 (*)    HCT 29.1 (*)    All other components within normal limits    EKG None  Radiology No results found.  Procedures Procedures    Medications Ordered in ED Medications - No data to display  ED Course/ Medical Decision Making/ A&P                            Medical Decision Making Amount and/or Complexity of Data Reviewed Labs: ordered.  Risk Decision regarding hospitalization.   Patient is a 56 year old who presents requesting dialysis.  He has mild shortness of breath but otherwise is asymptomatic.  Labs were obtained.  He has markedly elevated creatinine which is expected due to his end-stage renal disease.  His potassium is mildly elevated.  He was not given Lokelma due to an allergy to  same.  I spoke with [name] with nephrology.  He recommends admission so they can do dialysis and get him reestablished back in 2 the dialysis clinics.  I spoke with Dr. Lorin Mercy who will admit the patient for further treatment.  Final Clinical Impression(s) / ED Diagnoses Final diagnoses:  ESRD on dialysis [facility])  Hyperkalemia    Rx / DC Orders ED Discharge Orders     None         Malvin Johns, MD 11/07/21 1725    Malvin Johns, MD 11/07/21 1726

## 2021-11-08 ENCOUNTER — Other Ambulatory Visit: Payer: Self-pay | Admitting: Internal Medicine

## 2021-11-08 DIAGNOSIS — Z992 Dependence on renal dialysis: Secondary | ICD-10-CM | POA: Diagnosis not present

## 2021-11-08 DIAGNOSIS — N186 End stage renal disease: Secondary | ICD-10-CM | POA: Diagnosis not present

## 2021-11-08 LAB — RENAL FUNCTION PANEL
Albumin: 3.4 g/dL — ABNORMAL LOW (ref 3.5–5.0)
Anion gap: 17 — ABNORMAL HIGH (ref 5–15)
BUN: 72 mg/dL — ABNORMAL HIGH (ref 6–20)
CO2: 18 mmol/L — ABNORMAL LOW (ref 22–32)
Calcium: 8 mg/dL — ABNORMAL LOW (ref 8.9–10.3)
Chloride: 105 mmol/L (ref 98–111)
Creatinine, Ser: 13.93 mg/dL — ABNORMAL HIGH (ref 0.61–1.24)
GFR, Estimated: 4 mL/min — ABNORMAL LOW (ref >60)
Glucose, Bld: 92 mg/dL (ref 70–99)
Phosphorus: 8.8 mg/dL — ABNORMAL HIGH (ref 2.5–4.6)
Potassium: 5.8 mmol/L — ABNORMAL HIGH (ref 3.5–5.1)
Sodium: 140 mmol/L (ref 135–145)

## 2021-11-08 LAB — CBC
HCT: 28 % — ABNORMAL LOW (ref 39.0–52.0)
Hemoglobin: 9.2 g/dL — ABNORMAL LOW (ref 13.0–17.0)
MCH: 30.2 pg (ref 26.0–34.0)
MCHC: 32.9 g/dL (ref 30.0–36.0)
MCV: 91.8 fL (ref 80.0–100.0)
Platelets: 168 10*3/uL (ref 150–400)
RBC: 3.05 MIL/uL — ABNORMAL LOW (ref 4.22–5.81)
RDW: 13.3 % (ref 11.5–15.5)
WBC: 6.5 10*3/uL (ref 4.0–10.5)
nRBC: 0 % (ref 0.0–0.2)

## 2021-11-08 MED ORDER — AMOXICILLIN-POT CLAVULANATE 500-125 MG PO TABS
1.0000 | ORAL_TABLET | Freq: Every day | ORAL | Status: DC
  Administered 2021-11-08 – 2021-11-09 (×2): 500 mg via ORAL
  Filled 2021-11-08 (×2): qty 1

## 2021-11-08 NOTE — Progress Notes (Addendum)
PROGRESS NOTE    [name]  WCB:[id number] DOB: 06-25-1965 DOA: 11/07/2021 PCP: Horald Pollen, MD    Brief Narrative:  This 56 years old male with PMH significant for ESRD on hemodialysis, hypertension, hyperlipidemia presented in ED for hemodialysis.  Patient reported he went on vacation for 3 months and just returned to [place].  He lost his outpatient hemodialysis chair while he was gone.  He denies any complaints, his nephrologist has told him that he would have to come to the hospital and stay overnight for hemodialysis and be placed back on into the system.  He reports getting hemodialysis 3 days/week for 4 hours/day while he was away but he would like to talk to nephrologist since he has been on dialysis for 3 years now.  Assessment & Plan:   Principal Problem:   End stage renal disease on dialysis [facility]) Active Problems:   Dyslipidemia   Obesity (BMI 30-39.9)   Renovascular hypertension   ESRD requiring hemodialysis: Patient is medically stable, reports having headache. Patient went out to his country for 3 months and lost his outpatient hemodialysis chair while he was away.   He has discussed with nephrologist who has recommended to admit in the hospital overnight, and he will be arranged for outpatient hemodialysis set up. Patient does not appear volume overloaded or otherwise in acute need for hemodialysis. Nephrology recommended continue hemodialysis while outpatient dialysis set up in process Continue PhosLo, Rena-Vite  Essential hypertension: Continue amlodipine, carvedilol and hydralazine  Hyperlipidemia: Patient was previously on [place] but likely follow-up with cardiology to resume.  Headache: Patient reports having intermittent headache, denies any red flag symptoms. Consider CT head if he continues to complain about headache. Continue Augmentin for possible sinusitis.  Obesity: Diet and exercise discussed in detail. Estimated body mass index  is 34.8 kg/m as calculated from the following:   Height as of 03/23/21: '5\' 9"'$  (1.753 m).   Weight as of this encounter: 106.9 kg.   DVT prophylaxis: Heparin Code Status: (Full code) Family Communication: No family at bedside. Disposition Plan:   Status is: Observation The patient remains OBS appropriate and will d/c before 2 midnights.   Admitted for hemodialysis and patient needs outpatient hemodialysis set up.  Anticipated discharge home 10/2621.   Consultants:  Nephrology  Procedures: Hemodialysis . Antimicrobials: None  Subjective: Patient was seen and examined at bedside.  Overnight events noted.   Patient was getting hemodialysis in the hemodialysis suite.  He was complaining about headache,    Objective: Vitals:   11/08/21 1131 11/08/21 1158 11/08/21 1248 11/08/21 1353  BP: (!) 143/83 (!) 148/87  (!) 163/81  Pulse: 68 64  71  Resp: '14 13  16  '$ Temp:    98.8 F (37.1 C)  TempSrc:    Oral  SpO2: 100% 100%  100%  Weight:   106.9 kg     Intake/Output Summary (Last 24 hours) at 11/08/2021 1411 Last data filed at 11/08/2021 1158 Gross per 24 hour  Intake --  Output 3.5 ml  Net -3.5 ml   Filed Weights   11/08/21 0805 11/08/21 1248  Weight: 110.3 kg 106.9 kg    Examination:  General exam: Appears comfortable, not in any acute distress. Respiratory system: CTA bilaterally, no wheezing, no crackles, normal respiratory effort. Cardiovascular system: S1 & S2 heard, regular rate and rhythm, no murmur. Gastrointestinal system: Abdomen is soft, non tender, non distended, BS+ Central nervous system: Alert and oriented X 3. No focal neurological deficits. Extremities:  No edema, no cyanosis, no clubbing. Skin: No rashes, lesions or ulcers Psychiatry: Judgement and insight appear normal. Mood & affect appropriate.     Data Reviewed: I have personally reviewed following labs and imaging studies  CBC: Recent Labs  Lab 11/07/21 1320 11/08/21 0853  WBC 5.4 6.5   NEUTROABS 3.4  --   HGB 9.4* 9.2*  HCT 29.1* 28.0*  MCV 92.4 91.8  PLT 180 258   Basic Metabolic Panel: Recent Labs  Lab 11/07/21 1320 11/08/21 0853  NA 138 140  K 5.4* 5.8*  CL 103 105  CO2 20* 18*  GLUCOSE 106* 92  BUN 67* 72*  CREATININE 13.21* 13.93*  CALCIUM 7.6* 8.0*  PHOS  --  8.8*   GFR: CrCl cannot be calculated (Unknown ideal weight.). Liver Function Tests: Recent Labs  Lab 11/07/21 1320 11/08/21 0853  AST 12*  --   ALT 18  --   ALKPHOS 44  --   BILITOT 0.4  --   PROT 6.1*  --   ALBUMIN 3.6 3.4*   No results for input(s): "LIPASE", "AMYLASE" in the last 168 hours. No results for input(s): "AMMONIA" in the last 168 hours. Coagulation Profile: No results for input(s): "INR", "PROTIME" in the last 168 hours. Cardiac Enzymes: No results for input(s): "CKTOTAL", "CKMB", "CKMBINDEX", "TROPONINI" in the last 168 hours. BNP (last 3 results) No results for input(s): "PROBNP" in the last 8760 hours. HbA1C: No results for input(s): "HGBA1C" in the last 72 hours. CBG: No results for input(s): "GLUCAP" in the last 168 hours. Lipid Profile: No results for input(s): "CHOL", "HDL", "LDLCALC", "TRIG", "CHOLHDL", "LDLDIRECT" in the last 72 hours. Thyroid Function Tests: No results for input(s): "TSH", "T4TOTAL", "FREET4", "T3FREE", "THYROIDAB" in the last 72 hours. Anemia Panel: No results for input(s): "VITAMINB12", "FOLATE", "FERRITIN", "TIBC", "IRON", "RETICCTPCT" in the last 72 hours. Sepsis Labs: No results for input(s): "PROCALCITON", "LATICACIDVEN" in the last 168 hours.  No results found for this or any previous visit (from the past 240 hour(s)).   Radiology Studies: No results found.  Scheduled Meds:  amLODipine  5 mg Oral Daily   amoxicillin-clavulanate  1 tablet Oral Q1200   calcium acetate  1,334 mg Oral TID WC   carvedilol  25 mg Oral BID WC   Chlorhexidine Gluconate Cloth  6 each Topical Q0600   hydrALAZINE  25 mg Oral Q8H   multivitamin  with minerals  1 tablet Oral Daily   pantoprazole  40 mg Oral Daily   Continuous Infusions:   LOS: 0 days    Time spent: 50 mins    Emylia Latella, MD [facility]   If 7PM-7AM, please contact night-coverage

## 2021-11-08 NOTE — Progress Notes (Signed)
Received patient in bed to unit.  Alert and oriented.  Informed consent obtained from pt  Treatment initiated: 0910 Treatment completed: 1225  Patient tolerated well.  Transported back to the room  Alert, without acute distress.  Hand-off given to patient's nurse.   Access used: fistula left arm  Access issues: none Total UF removed: 3.5 liters Medication(s) given: tylenol Post HD VS: 134/82 HR yo RR 14 Sat 100% on room air Temp oral 98.0 Post HD weight: 106.9 kg stand wt   [facility]

## 2021-11-08 NOTE — Procedures (Signed)
Patient seen and examined on Hemodialysis. BP (!) 178/81   Pulse 79   Temp 97.8 F (36.6 C)   Resp 17   Wt 110.3 kg   SpO2 100%   BMI 35.91 kg/m   QB 400 mL/ min via AVF, UF goal 3L  Tolerating treatment without complaints at this time.  No current headache reported   Madelon Lips MD [place] [facility] [phone] 9:15 AM

## 2021-11-08 NOTE — Progress Notes (Signed)
Requested to see pt for out-pt HD needs at d/c. Pt was a previous pt at [facility] prior to pt returning to his home country for an extended period of time. Contacted [facility] who confirms they did d/c pt and that pt would need to be a new referral to clinic to resume care. Pt currently in HD. Contacted pt's wife via phone who confirms plan for pt to remain in the [place] area and that pt/family prefer for pt to resume at [facility] if possible. Referral submitted to [facility] admissions this morning for review. Clinic also advised that pt's referral has been submitted. Will assist as needed.   Melven Sartorius Renal Navigator [phone]

## 2021-11-08 NOTE — Progress Notes (Signed)
[name] Progress Note   Assessment/ Plan:   Assessment/ Plan: ESRD - pt was out patient at [facility] but has been getting HD overseas for the last 3 mos and upon returning to the [place], we see that he is no longer a HD patient is in our system. He is not grossly underdialyzed, does not appear uremic. HD MWF schedule while here, needs to be re-CLIPP'd BP/ vol - BP's a bit high, resume usual medications he is taking at home. UF goal 3 L w/ HD tomorrow.  Anemia esrd - Hb 9.4, follow trend.   MBD ckd - CCa a bit low. Cont phoslo 2 ac tid. Get phos as add-on.  Hyperkalemia - mild, give veltassa 10 gm x 1 tonight, 1 K bath 45 min w/ HD today Headaches - dialysis-associated.  May be vol-related- will monitor and adjust prescription Sinusitis- will do renal- dose augmentin. Dispo: pending CLIP  Subjective:    Seen on dialysis.  Reporting some nasal congestion and drainage (yellow-green) as well as headaches on dialysis.     Objective:   BP (!) 178/81   Pulse 79   Temp 97.8 F (36.6 C)   Resp 17   Wt 110.3 kg   SpO2 100%   BMI 35.91 kg/m   Physical Exam: Gen:NAD, lying in bed CVS: RRR Resp: clear Abd: soft Ext: no LE edema ACCESS: R AVF + T/B  Labs: BMET Recent Labs  Lab 11/07/21 1320  NA 138  K 5.4*  CL 103  CO2 20*  GLUCOSE 106*  BUN 67*  CREATININE 13.21*  CALCIUM 7.6*   CBC Recent Labs  Lab 11/07/21 1320  WBC 5.4  NEUTROABS 3.4  HGB 9.4*  HCT 29.1*  MCV 92.4  PLT 180      Medications:     amLODipine  5 mg Oral Daily   calcium acetate  1,334 mg Oral TID WC   carvedilol  25 mg Oral BID WC   Chlorhexidine Gluconate Cloth  6 each Topical Q0600   hydrALAZINE  25 mg Oral Q8H   multivitamin with minerals  1 tablet Oral Daily   pantoprazole  40 mg Oral Daily     Madelon Lips, MD 11/08/2021, 9:09 AM

## 2021-11-09 DIAGNOSIS — N186 End stage renal disease: Secondary | ICD-10-CM | POA: Diagnosis not present

## 2021-11-09 DIAGNOSIS — Z992 Dependence on renal dialysis: Secondary | ICD-10-CM | POA: Diagnosis not present

## 2021-11-09 LAB — BASIC METABOLIC PANEL
Anion gap: 17 — ABNORMAL HIGH (ref 5–15)
BUN: 46 mg/dL — ABNORMAL HIGH (ref 6–20)
CO2: 23 mmol/L (ref 22–32)
Calcium: 8.1 mg/dL — ABNORMAL LOW (ref 8.9–10.3)
Chloride: 92 mmol/L — ABNORMAL LOW (ref 98–111)
Creatinine, Ser: 9.45 mg/dL — ABNORMAL HIGH (ref 0.61–1.24)
GFR, Estimated: 6 mL/min — ABNORMAL LOW (ref >60)
Glucose, Bld: 121 mg/dL — ABNORMAL HIGH (ref 70–99)
Potassium: 4.6 mmol/L (ref 3.5–5.1)
Sodium: 132 mmol/L — ABNORMAL LOW (ref 135–145)

## 2021-11-09 LAB — HEPATITIS B SURFACE ANTIBODY, QUANTITATIVE: Hep B S AB Quant (Post): 86.8 m[IU]/mL (ref >9.9)

## 2021-11-09 MED ORDER — AMOXICILLIN-POT CLAVULANATE 500-125 MG PO TABS: 1.0000 | ORAL_TABLET | Freq: Every day | ORAL | 0 refills | Status: AC

## 2021-11-09 NOTE — Progress Notes (Signed)
Pt has been accepted at [facility] on TTS 11:40 chair time. Pt can start on Thursday and will need to arrive at 11:00 to complete paperwork prior to treatment. Met with pt at bedside to discuss above details. Pt provided schedule letter with details noted and added to pt's AVS as well. Spoke to pt's daughter, Soundra Pilon [phone]), via phone to make her aware of pt's new HD schedule. Daughter states pt drives self or family can assist with transportation if needed. Pt/family agreeable to plans. Update provided to attending, nephrologist, renal NP, RN, RN CM,and CSW via secure chat. Clinic advised pt will d/c today and start on Thursday as planned. Renal NP to provide orders to clinic.   Melven Sartorius Renal Navigator [phone]

## 2021-11-09 NOTE — Progress Notes (Signed)
Discharge instructions reviewed with pt.  Copy of instructions given to pt. Informed of scripts sent to his pharmacies--1 mailed to pt and other local pharmacy. Pt verbalizes understanding.  Pt to be d/c'd via wheelchair with belongings. Pt drove self, and has his own car here. Pt independent in the room with steady gait.          Escorted by staff.

## 2021-11-09 NOTE — Progress Notes (Signed)
[name] Progress Note   Subjective: Seen in room. Telling same story he told yesterday about how he suffers last hour of HD with HA, nasal congestion, etc. Interrupted him to tell him that he is being discharged home and will return to OP HD clinic.  Started on ABX yesterday for sinus congestion and pressure. Will adjust OP HD settings.   Objective Vitals:   11/08/21 2115 11/09/21 0307 11/09/21 0407 11/09/21 0819  BP: (!) 142/80  (!) 148/63 (!) 158/86  Pulse: 72  77 81  Resp: '16  17 17  '$ Temp: 98.1 F (36.7 C)  98.1 F (36.7 C) 98.5 F (36.9 C)  TempSrc: Oral  Oral Oral  SpO2: 99%  100% 97%  Weight:  107.5 kg     Physical Exam General: Talkative older male in NAD Heart:S1,S2 RRR Lungs: CTAB Abdomen: NABS, NT Extremities: no LE edema Dialysis Access: R AVF + TB    Additional Objective Labs: Basic Metabolic Panel: Recent Labs  Lab 11/07/21 1320 11/08/21 0853 11/09/21 1018  NA 138 140 132*  K 5.4* 5.8* 4.6  CL 103 105 92*  CO2 20* 18* 23  GLUCOSE 106* 92 121*  BUN 67* 72* 46*  CREATININE 13.21* 13.93* 9.45*  CALCIUM 7.6* 8.0* 8.1*  PHOS  --  8.8*  --    Liver Function Tests: Recent Labs  Lab 11/07/21 1320 11/08/21 0853  AST 12*  --   ALT 18  --   ALKPHOS 44  --   BILITOT 0.4  --   PROT 6.1*  --   ALBUMIN 3.6 3.4*   No results for input(s): "LIPASE", "AMYLASE" in the last 168 hours. CBC: Recent Labs  Lab 11/07/21 1320 11/08/21 0853  WBC 5.4 6.5  NEUTROABS 3.4  --   HGB 9.4* 9.2*  HCT 29.1* 28.0*  MCV 92.4 91.8  PLT 180 168   Blood Culture    Component Value Date/Time   SDES BLOOD RIGHT ANTECUBITAL 10/31/2019 1456   SPECREQUEST  10/31/2019 1456    BOTTLES DRAWN AEROBIC AND ANAEROBIC Blood Culture adequate volume   CULT  10/31/2019 1456    NO GROWTH 5 DAYS Performed at [facility], [place] [address], [place], [place] [address]    REPTSTATUS 11/05/2019 FINAL 10/31/2019 1456    Cardiac Enzymes: No results for  input(s): "CKTOTAL", "CKMB", "CKMBINDEX", "TROPONINI" in the last 168 hours. CBG: No results for input(s): "GLUCAP" in the last 168 hours. Iron Studies: No results for input(s): "IRON", "TIBC", "TRANSFERRIN", "FERRITIN" in the last 72 hours. '@lablastinr3'$ @ Studies/Results: No results found. Medications:   amLODipine  5 mg Oral Daily   amoxicillin-clavulanate  1 tablet Oral Q1200   calcium acetate  1,334 mg Oral TID WC   carvedilol  25 mg Oral BID WC   Chlorhexidine Gluconate Cloth  6 each Topical Q0600   hydrALAZINE  25 mg Oral Q8H   multivitamin with minerals  1 tablet Oral Daily   pantoprazole  40 mg Oral Daily     Assessment/ Plan: ESRD - pt was out patient at [facility] but has been getting HD overseas for the last 3 mos and upon returning to the [place], we see that he is no longer a HD patient is in our system. He is not grossly underdialyzed, does not appear uremic. HD MWF schedule while here. Has been clipped to [facility]. Can start at 11/11/2021 chair time is 1140.  BP/ vol - BP's a bit high, resumed home medications. Needs volume  lowered. Net UF with HD 3.5 liters 11/08/2021. Continue lowering volume as tolerated.  Anemia esrd - Hb 9.4, follow trend.   MBD ckd - CCa a bit low. Cont phoslo 2 ac tid. Get phos as add-on.  Hyperkalemia - mild, give veltassa 10 gm x 1 tonight, 1 K bath 45 min w/ HD today Headaches - dialysis-associated.  May be vol-related- will monitor and adjust prescription Sinusitis- will do renal- dose augmentin. Dispo: DC home today.   Andrew Urschel H. Nobie Alleyne NP-C 11/09/2021, 12:56 PM  [employer] [phone]

## 2021-11-09 NOTE — Discharge Summary (Signed)
Physician Discharge Summary  [name] TDV:[id number] DOB: 09-Mar-1965 DOA: 11/07/2021  PCP: Horald Pollen, MD  Admit date: 11/07/2021  Discharge date: 11/09/2021  Admitted From: Home. Disposition:  Home.  Recommendations for Outpatient Follow-up:  Follow up with PCP in 1-2 weeks. Please obtain BMP/CBC in one week. Outpatient hemodialysis set up has been arranged. Follow-up with outpatient hemodialysis. Advised to take Augmentin twice a day for 3 more days for sinusitis  Home Health:None Equipment/Devices:None  Discharge Condition: Stable CODE STATUS:Full code Diet recommendation: Renal Diet  Brief Summary/ Hospital Course: This 56 years old male with PMH significant for ESRD on hemodialysis, hypertension, hyperlipidemia presented in ED for hemodialysis. Patient reports he went on vacation to his country for 3 months and just returned to [place].  He lost his outpatient hemodialysis chair while he was gone.  He denies any complaints, his nephrologist has told him that he would have to come to the hospital and stay overnight for hemodialysis and be placed back on into the system.  He reports getting hemodialysis 3 days /week for 4 hours /day while he was away but he would like to talk to nephrologist since he has been on dialysis for 3 years now.  Patient has received hemodialysis during hospitalization.  Outpatient hemodialysis set up has been arranged.  Patient is being discharged home patient is being discharged on Augmentin for possible sinusitis.  Nephrology signed off.   Discharge Diagnoses:  Principal Problem:   End stage renal disease on dialysis [facility]) Active Problems:   Dyslipidemia   Obesity (BMI 30-39.9)   Renovascular hypertension  ESRD requiring hemodialysis: Patient is medically stable, reports having headache. Patient went out to his country for 3 months and lost his outpatient hemodialysis chair while he was away.   He has discussed with nephrologist  who has recommended to admit in the hospital overnight, and he will be arranged for outpatient hemodialysis set up. Patient does not appear volume overloaded or otherwise in acute need for hemodialysis. Nephrology recommended continue hemodialysis while outpatient dialysis set up in process Continue PhosLo, Rena-Vite   Essential hypertension: Continue amlodipine, carvedilol and hydralazine   Hyperlipidemia: Patient was previously on [place] but likely follow-up with cardiology to resume.   Headache: Patient reports having intermittent headache, denies any red flag symptoms. Consider CT head if he continues to complain about headache. Continue Augmentin for possible sinusitis.   Obesity: Diet and exercise discussed in detail. Estimated body mass index is 34.8 kg/m as calculated from the following:   Height as of 03/23/21: '5\' 9"'$  (1.753 m).   Weight as of this encounter: 106.9 kg.   Discharge Instructions  Discharge Instructions     Call MD for:  difficulty breathing, headache or visual disturbances   Complete by: As directed    Call MD for:  persistant dizziness or light-headedness   Complete by: As directed    Call MD for:  persistant nausea and vomiting   Complete by: As directed    Diet - low sodium heart healthy   Complete by: As directed    Diet Carb Modified   Complete by: As directed    Discharge instructions   Complete by: As directed    Advised to follow-up with primary care physician in 1 week. Patient is being discharged home outpatient hemodialysis set up has been arranged. Follow-up with outpatient hemodialysis. Advised to take Augmentin twice a day for 3 more days for sinusitis   Increase activity slowly   Complete by: As  directed       Allergies as of 11/09/2021       Reactions   Lokelma [sodium Zirconium Cyclosilicate] Shortness Of Breath   Beef-derived Products Other (See Comments)   Cultural preference   Pegademase Bovine Other (See Comments)    Cultural preference   Poractant Alfa Other (See Comments)   Cultural preference   Pork-derived Products Other (See Comments)   Cultural preference        Medication List     STOP taking these medications    Nurtec 75 MG Tbdp Generic drug: Rimegepant Sulfate   ondansetron 4 MG tablet Commonly known as: Zofran       TAKE these medications    amLODipine 5 MG tablet Commonly known as: NORVASC Take 1 tablet (5 mg total) by mouth daily.   amoxicillin-clavulanate 500-125 MG tablet Commonly known as: AUGMENTIN Take 1 tablet (500 mg total) by mouth daily at 12 noon for 3 days.   benzoyl peroxide 5 % external liquid Generic drug: benzoyl peroxide Apply topically 2 (two) times daily.   Betamethasone Sodium Phosphate 6 MG/ML Soln INJECT 2:4 SYRINGE BILATERALLY INTO EACH KNEE JOINT   calcium acetate 667 MG capsule Commonly known as: PHOSLO Take 2 capsules (1,334 mg total) by mouth 3 (three) times daily with meals.   carvedilol 25 MG tablet Commonly known as: COREG Take 1 tablet (25 mg total) by mouth 2 (two) times daily with a meal.   DIALYVITE 800 WITH ZINC 0.8 MG Tabs Take 1 tablet by mouth daily.   hydrALAZINE 25 MG tablet Commonly known as: APRESOLINE Take 1 tablet (25 mg total) by mouth every 8 (eight) hours.   lidocaine-prilocaine cream Commonly known as: EMLA Apply topically.   pantoprazole 40 MG tablet Commonly known as: PROTONIX Take 1 tablet (40 mg total) by mouth daily.   Vascepa 1 g capsule Generic drug: icosapent Ethyl TAKE TWO (2) CAPSULES BY MOUTH TWICE DAILY *MUST KEEP APPOINTMENT FOR FURTHER REFILLS* What changed: See the new instructions.        [place], [facility] Go on 11/11/2021.   Why: Schedule is Tuesday/Thursday/Saturday with 11:40 chair time.  On Thursday, please arrive at 11:00 to complete paperwork prior to treatment. Contact information: [place]  [address] [phone]         Horald Pollen, MD Follow up in 1 week(s).   Specialty: Internal Medicine Contact information: [address] [place] [address] [phone]         [name], MD .   Specialty: Cardiology Contact information: [address] [place] [address] [phone]                Allergies  Allergen Reactions   Lokelma [Sodium Zirconium Cyclosilicate] Shortness Of Breath   Beef-Derived Products Other (See Comments)    Cultural preference   Pegademase Bovine Other (See Comments)    Cultural preference   Poractant Alfa Other (See Comments)    Cultural preference   Pork-Derived Products Other (See Comments)    Cultural preference    Consultations: Nephrology   Procedures/Studies: No results found.  Subjective: Patient was seen and examined at bedside.  Overnight events noted.   Patient reports feeling better,  Patient had hemodialysis.  Outpatient hemodialysis set up is arranged.   Patient is being discharged home.  Discharge Exam: Vitals:   11/09/21 0407 11/09/21 0819  BP: (!) 148/63 (!) 158/86  Pulse: 77 81  Resp: 17 17  Temp: 98.1 F (36.7 C) 98.5 F (36.9 C)  SpO2: 100% 97%   Vitals:   11/08/21 2115 11/09/21 0307 11/09/21 0407 11/09/21 0819  BP: (!) 142/80  (!) 148/63 (!) 158/86  Pulse: 72  77 81  Resp: '16  17 17  '$ Temp: 98.1 F (36.7 C)  98.1 F (36.7 C) 98.5 F (36.9 C)  TempSrc: Oral  Oral Oral  SpO2: 99%  100% 97%  Weight:  107.5 kg      General: Pt is alert, awake, not in acute distress Cardiovascular: RRR, S1/S2 +, no rubs, no gallops Respiratory: CTA bilaterally, no wheezing, no rhonchi Abdominal: Soft, NT, ND, bowel sounds + Extremities: no edema, no cyanosis    The results of significant diagnostics from this hospitalization (including imaging, microbiology, ancillary and laboratory) are listed below for reference.     Microbiology: No results found for this or  any previous visit (from the past 240 hour(s)).   Labs: BNP (last 3 results) Recent Labs    04/14/21 2350  BNP 998.3*   Basic Metabolic Panel: Recent Labs  Lab 11/07/21 1320 11/08/21 0853 11/09/21 1018  NA 138 140 132*  K 5.4* 5.8* 4.6  CL 103 105 92*  CO2 20* 18* 23  GLUCOSE 106* 92 121*  BUN 67* 72* 46*  CREATININE 13.21* 13.93* 9.45*  CALCIUM 7.6* 8.0* 8.1*  PHOS  --  8.8*  --    Liver Function Tests: Recent Labs  Lab 11/07/21 1320 11/08/21 0853  AST 12*  --   ALT 18  --   ALKPHOS 44  --   BILITOT 0.4  --   PROT 6.1*  --   ALBUMIN 3.6 3.4*   No results for input(s): "LIPASE", "AMYLASE" in the last 168 hours. No results for input(s): "AMMONIA" in the last 168 hours. CBC: Recent Labs  Lab 11/07/21 1320 11/08/21 0853  WBC 5.4 6.5  NEUTROABS 3.4  --   HGB 9.4* 9.2*  HCT 29.1* 28.0*  MCV 92.4 91.8  PLT 180 168   Cardiac Enzymes: No results for input(s): "CKTOTAL", "CKMB", "CKMBINDEX", "TROPONINI" in the last 168 hours. BNP: Invalid input(s): "POCBNP" CBG: No results for input(s): "GLUCAP" in the last 168 hours. D-Dimer No results for input(s): "DDIMER" in the last 72 hours. Hgb A1c No results for input(s): "HGBA1C" in the last 72 hours. Lipid Profile No results for input(s): "CHOL", "HDL", "LDLCALC", "TRIG", "CHOLHDL", "LDLDIRECT" in the last 72 hours. Thyroid function studies No results for input(s): "TSH", "T4TOTAL", "T3FREE", "THYROIDAB" in the last 72 hours.  Invalid input(s): "FREET3" Anemia work up No results for input(s): "VITAMINB12", "FOLATE", "FERRITIN", "TIBC", "IRON", "RETICCTPCT" in the last 72 hours. Urinalysis    Component Value Date/Time   COLORURINE STRAW (A) 08/25/2018 1436   APPEARANCEUR CLEAR 08/25/2018 1436   LABSPEC 1.011 08/25/2018 1436   PHURINE 6.0 08/25/2018 1436   GLUCOSEU NEGATIVE 08/25/2018 1436   HGBUR NEGATIVE 08/25/2018 1436   BILIRUBINUR negative 02/06/2019 1529   BILIRUBINUR neg 03/12/2013 1058   KETONESUR  negative 02/06/2019 1529   KETONESUR NEGATIVE 08/25/2018 1436   PROTEINUR >=300 (A) 02/06/2019 1529   PROTEINUR >=300 (A) 08/25/2018 1436   UROBILINOGEN 0.2 02/06/2019 1529   UROBILINOGEN 0.2 07/21/2012 1235   NITRITE Negative 02/06/2019 1529   NITRITE NEGATIVE 08/25/2018 1436   LEUKOCYTESUR Negative 02/06/2019 1529   LEUKOCYTESUR NEGATIVE 08/25/2018 1436   Sepsis Labs Recent Labs  Lab 11/07/21 1320 11/08/21 0853  WBC 5.4  6.5   Microbiology No results found for this or any previous visit (from the past 240 hour(s)).   Time coordinating discharge: Over 30 minutes  SIGNED:   Shawna Clamp, MD  [facility] 11/09/2021, 2:18 PM Pager   If 7PM-7AM, please contact night-coverage

## 2021-11-09 NOTE — Discharge Instructions (Signed)
Advised to follow-up with primary care physician in 1 week. Patient is being discharged home outpatient hemodialysis set up has been arranged. Follow-up with outpatient hemodialysis. Advised to take Augmentin twice a day for 3 more days for sinusitis

## 2021-11-11 ENCOUNTER — Telehealth: Payer: Self-pay | Admitting: Nurse Practitioner

## 2021-11-11 DIAGNOSIS — N186 End stage renal disease: Secondary | ICD-10-CM | POA: Diagnosis not present

## 2021-11-11 DIAGNOSIS — Z992 Dependence on renal dialysis: Secondary | ICD-10-CM | POA: Diagnosis not present

## 2021-11-11 NOTE — Telephone Encounter (Signed)
Transition of care contact from inpatient facility  Date of Discharge: 11/10/2021 Date of Contact: 11/11/2021 Method of contact: Phone  Attempted to contact patient to discuss transition of care from inpatient admission. Patient did not answer the phone. Message was left on the patient's voicemail with call back number [phone].

## 2021-11-13 DIAGNOSIS — N186 End stage renal disease: Secondary | ICD-10-CM | POA: Diagnosis not present

## 2021-11-13 DIAGNOSIS — Z992 Dependence on renal dialysis: Secondary | ICD-10-CM | POA: Diagnosis not present

## 2021-11-16 DIAGNOSIS — N186 End stage renal disease: Secondary | ICD-10-CM | POA: Diagnosis not present

## 2021-11-16 DIAGNOSIS — N2581 Secondary hyperparathyroidism of renal origin: Secondary | ICD-10-CM | POA: Diagnosis not present

## 2021-11-16 DIAGNOSIS — Z992 Dependence on renal dialysis: Secondary | ICD-10-CM | POA: Diagnosis not present

## 2021-11-17 ENCOUNTER — Ambulatory Visit: Payer: Medicare HMO | Admitting: Emergency Medicine

## 2021-11-18 DIAGNOSIS — N2581 Secondary hyperparathyroidism of renal origin: Secondary | ICD-10-CM | POA: Diagnosis not present

## 2021-11-18 DIAGNOSIS — Z992 Dependence on renal dialysis: Secondary | ICD-10-CM | POA: Diagnosis not present

## 2021-11-18 DIAGNOSIS — N186 End stage renal disease: Secondary | ICD-10-CM | POA: Diagnosis not present

## 2021-11-20 DIAGNOSIS — Z992 Dependence on renal dialysis: Secondary | ICD-10-CM | POA: Diagnosis not present

## 2021-11-20 DIAGNOSIS — N186 End stage renal disease: Secondary | ICD-10-CM | POA: Diagnosis not present

## 2021-11-20 DIAGNOSIS — N2581 Secondary hyperparathyroidism of renal origin: Secondary | ICD-10-CM | POA: Diagnosis not present

## 2021-11-23 DIAGNOSIS — D509 Iron deficiency anemia, unspecified: Secondary | ICD-10-CM | POA: Diagnosis not present

## 2021-11-23 DIAGNOSIS — Z992 Dependence on renal dialysis: Secondary | ICD-10-CM | POA: Diagnosis not present

## 2021-11-23 DIAGNOSIS — D631 Anemia in chronic kidney disease: Secondary | ICD-10-CM | POA: Diagnosis not present

## 2021-11-23 DIAGNOSIS — N186 End stage renal disease: Secondary | ICD-10-CM | POA: Diagnosis not present

## 2021-11-23 DIAGNOSIS — N2581 Secondary hyperparathyroidism of renal origin: Secondary | ICD-10-CM | POA: Diagnosis not present

## 2021-11-25 DIAGNOSIS — N2581 Secondary hyperparathyroidism of renal origin: Secondary | ICD-10-CM | POA: Diagnosis not present

## 2021-11-25 DIAGNOSIS — D631 Anemia in chronic kidney disease: Secondary | ICD-10-CM | POA: Diagnosis not present

## 2021-11-25 DIAGNOSIS — D509 Iron deficiency anemia, unspecified: Secondary | ICD-10-CM | POA: Diagnosis not present

## 2021-11-25 DIAGNOSIS — N186 End stage renal disease: Secondary | ICD-10-CM | POA: Diagnosis not present

## 2021-11-25 DIAGNOSIS — Z992 Dependence on renal dialysis: Secondary | ICD-10-CM | POA: Diagnosis not present

## 2021-11-27 DIAGNOSIS — N186 End stage renal disease: Secondary | ICD-10-CM | POA: Diagnosis not present

## 2021-11-27 DIAGNOSIS — D509 Iron deficiency anemia, unspecified: Secondary | ICD-10-CM | POA: Diagnosis not present

## 2021-11-27 DIAGNOSIS — D631 Anemia in chronic kidney disease: Secondary | ICD-10-CM | POA: Diagnosis not present

## 2021-11-27 DIAGNOSIS — Z992 Dependence on renal dialysis: Secondary | ICD-10-CM | POA: Diagnosis not present

## 2021-11-27 DIAGNOSIS — N2581 Secondary hyperparathyroidism of renal origin: Secondary | ICD-10-CM | POA: Diagnosis not present

## 2021-11-30 DIAGNOSIS — N186 End stage renal disease: Secondary | ICD-10-CM | POA: Diagnosis not present

## 2021-11-30 DIAGNOSIS — Z992 Dependence on renal dialysis: Secondary | ICD-10-CM | POA: Diagnosis not present

## 2021-11-30 DIAGNOSIS — N2581 Secondary hyperparathyroidism of renal origin: Secondary | ICD-10-CM | POA: Diagnosis not present

## 2021-12-02 DIAGNOSIS — N186 End stage renal disease: Secondary | ICD-10-CM | POA: Diagnosis not present

## 2021-12-02 DIAGNOSIS — Z992 Dependence on renal dialysis: Secondary | ICD-10-CM | POA: Diagnosis not present

## 2021-12-02 DIAGNOSIS — N2581 Secondary hyperparathyroidism of renal origin: Secondary | ICD-10-CM | POA: Diagnosis not present

## 2021-12-04 DIAGNOSIS — Z992 Dependence on renal dialysis: Secondary | ICD-10-CM | POA: Diagnosis not present

## 2021-12-04 DIAGNOSIS — N186 End stage renal disease: Secondary | ICD-10-CM | POA: Diagnosis not present

## 2021-12-04 DIAGNOSIS — N2581 Secondary hyperparathyroidism of renal origin: Secondary | ICD-10-CM | POA: Diagnosis not present

## 2021-12-07 DIAGNOSIS — Z992 Dependence on renal dialysis: Secondary | ICD-10-CM | POA: Diagnosis not present

## 2021-12-07 DIAGNOSIS — N186 End stage renal disease: Secondary | ICD-10-CM | POA: Diagnosis not present

## 2021-12-07 DIAGNOSIS — D631 Anemia in chronic kidney disease: Secondary | ICD-10-CM | POA: Diagnosis not present

## 2021-12-07 DIAGNOSIS — N2581 Secondary hyperparathyroidism of renal origin: Secondary | ICD-10-CM | POA: Diagnosis not present

## 2021-12-09 DIAGNOSIS — Z992 Dependence on renal dialysis: Secondary | ICD-10-CM | POA: Diagnosis not present

## 2021-12-09 DIAGNOSIS — N2581 Secondary hyperparathyroidism of renal origin: Secondary | ICD-10-CM | POA: Diagnosis not present

## 2021-12-09 DIAGNOSIS — N186 End stage renal disease: Secondary | ICD-10-CM | POA: Diagnosis not present

## 2021-12-09 DIAGNOSIS — D631 Anemia in chronic kidney disease: Secondary | ICD-10-CM | POA: Diagnosis not present

## 2021-12-11 DIAGNOSIS — N2581 Secondary hyperparathyroidism of renal origin: Secondary | ICD-10-CM | POA: Diagnosis not present

## 2021-12-11 DIAGNOSIS — D631 Anemia in chronic kidney disease: Secondary | ICD-10-CM | POA: Diagnosis not present

## 2021-12-11 DIAGNOSIS — Z992 Dependence on renal dialysis: Secondary | ICD-10-CM | POA: Diagnosis not present

## 2021-12-11 DIAGNOSIS — N186 End stage renal disease: Secondary | ICD-10-CM | POA: Diagnosis not present

## 2021-12-13 ENCOUNTER — Ambulatory Visit: Payer: Medicare HMO | Attending: Internal Medicine | Admitting: Internal Medicine

## 2021-12-14 DIAGNOSIS — N2581 Secondary hyperparathyroidism of renal origin: Secondary | ICD-10-CM | POA: Diagnosis not present

## 2021-12-14 DIAGNOSIS — Z992 Dependence on renal dialysis: Secondary | ICD-10-CM | POA: Diagnosis not present

## 2021-12-14 DIAGNOSIS — N186 End stage renal disease: Secondary | ICD-10-CM | POA: Diagnosis not present

## 2021-12-16 DIAGNOSIS — Z992 Dependence on renal dialysis: Secondary | ICD-10-CM | POA: Diagnosis not present

## 2021-12-16 DIAGNOSIS — N2581 Secondary hyperparathyroidism of renal origin: Secondary | ICD-10-CM | POA: Diagnosis not present

## 2021-12-16 DIAGNOSIS — N186 End stage renal disease: Secondary | ICD-10-CM | POA: Diagnosis not present

## 2021-12-18 DIAGNOSIS — N186 End stage renal disease: Secondary | ICD-10-CM | POA: Diagnosis not present

## 2021-12-18 DIAGNOSIS — Z992 Dependence on renal dialysis: Secondary | ICD-10-CM | POA: Diagnosis not present

## 2021-12-18 DIAGNOSIS — N2581 Secondary hyperparathyroidism of renal origin: Secondary | ICD-10-CM | POA: Diagnosis not present

## 2021-12-21 DIAGNOSIS — N2581 Secondary hyperparathyroidism of renal origin: Secondary | ICD-10-CM | POA: Diagnosis not present

## 2021-12-21 DIAGNOSIS — D631 Anemia in chronic kidney disease: Secondary | ICD-10-CM | POA: Diagnosis not present

## 2021-12-21 DIAGNOSIS — D509 Iron deficiency anemia, unspecified: Secondary | ICD-10-CM | POA: Diagnosis not present

## 2021-12-21 DIAGNOSIS — N186 End stage renal disease: Secondary | ICD-10-CM | POA: Diagnosis not present

## 2021-12-21 DIAGNOSIS — Z23 Encounter for immunization: Secondary | ICD-10-CM | POA: Diagnosis not present

## 2021-12-21 DIAGNOSIS — Z992 Dependence on renal dialysis: Secondary | ICD-10-CM | POA: Diagnosis not present

## 2021-12-23 DIAGNOSIS — Z992 Dependence on renal dialysis: Secondary | ICD-10-CM | POA: Diagnosis not present

## 2021-12-23 DIAGNOSIS — D631 Anemia in chronic kidney disease: Secondary | ICD-10-CM | POA: Diagnosis not present

## 2021-12-23 DIAGNOSIS — N2581 Secondary hyperparathyroidism of renal origin: Secondary | ICD-10-CM | POA: Diagnosis not present

## 2021-12-23 DIAGNOSIS — Z23 Encounter for immunization: Secondary | ICD-10-CM | POA: Diagnosis not present

## 2021-12-23 DIAGNOSIS — N186 End stage renal disease: Secondary | ICD-10-CM | POA: Diagnosis not present

## 2021-12-23 DIAGNOSIS — D509 Iron deficiency anemia, unspecified: Secondary | ICD-10-CM | POA: Diagnosis not present

## 2021-12-24 DIAGNOSIS — Z6835 Body mass index (BMI) 35.0-35.9, adult: Secondary | ICD-10-CM | POA: Diagnosis not present

## 2021-12-24 DIAGNOSIS — Z992 Dependence on renal dialysis: Secondary | ICD-10-CM | POA: Diagnosis not present

## 2021-12-24 DIAGNOSIS — I509 Heart failure, unspecified: Secondary | ICD-10-CM | POA: Diagnosis not present

## 2021-12-24 DIAGNOSIS — J302 Other seasonal allergic rhinitis: Secondary | ICD-10-CM | POA: Diagnosis not present

## 2021-12-24 DIAGNOSIS — K219 Gastro-esophageal reflux disease without esophagitis: Secondary | ICD-10-CM | POA: Diagnosis not present

## 2021-12-24 DIAGNOSIS — M1990 Unspecified osteoarthritis, unspecified site: Secondary | ICD-10-CM | POA: Diagnosis not present

## 2021-12-24 DIAGNOSIS — Z8249 Family history of ischemic heart disease and other diseases of the circulatory system: Secondary | ICD-10-CM | POA: Diagnosis not present

## 2021-12-24 DIAGNOSIS — N186 End stage renal disease: Secondary | ICD-10-CM | POA: Diagnosis not present

## 2021-12-24 DIAGNOSIS — I132 Hypertensive heart and chronic kidney disease with heart failure and with stage 5 chronic kidney disease, or end stage renal disease: Secondary | ICD-10-CM | POA: Diagnosis not present

## 2021-12-24 DIAGNOSIS — E785 Hyperlipidemia, unspecified: Secondary | ICD-10-CM | POA: Diagnosis not present

## 2021-12-25 DIAGNOSIS — D509 Iron deficiency anemia, unspecified: Secondary | ICD-10-CM | POA: Diagnosis not present

## 2021-12-25 DIAGNOSIS — N2581 Secondary hyperparathyroidism of renal origin: Secondary | ICD-10-CM | POA: Diagnosis not present

## 2021-12-25 DIAGNOSIS — D631 Anemia in chronic kidney disease: Secondary | ICD-10-CM | POA: Diagnosis not present

## 2021-12-25 DIAGNOSIS — Z992 Dependence on renal dialysis: Secondary | ICD-10-CM | POA: Diagnosis not present

## 2021-12-25 DIAGNOSIS — N186 End stage renal disease: Secondary | ICD-10-CM | POA: Diagnosis not present

## 2021-12-25 DIAGNOSIS — Z23 Encounter for immunization: Secondary | ICD-10-CM | POA: Diagnosis not present

## 2021-12-28 DIAGNOSIS — D631 Anemia in chronic kidney disease: Secondary | ICD-10-CM | POA: Diagnosis not present

## 2021-12-28 DIAGNOSIS — N186 End stage renal disease: Secondary | ICD-10-CM | POA: Diagnosis not present

## 2021-12-28 DIAGNOSIS — Z992 Dependence on renal dialysis: Secondary | ICD-10-CM | POA: Diagnosis not present

## 2021-12-28 DIAGNOSIS — N2581 Secondary hyperparathyroidism of renal origin: Secondary | ICD-10-CM | POA: Diagnosis not present

## 2021-12-29 ENCOUNTER — Ambulatory Visit (INDEPENDENT_AMBULATORY_CARE_PROVIDER_SITE_OTHER): Payer: Medicare HMO | Admitting: Emergency Medicine

## 2021-12-29 ENCOUNTER — Encounter: Payer: Self-pay | Admitting: Emergency Medicine

## 2021-12-29 VITALS — BP 130/70 | HR 95 | Temp 98.4°F | Ht 69.0 in | Wt 243.4 lb

## 2021-12-29 DIAGNOSIS — Z7682 Awaiting organ transplant status: Secondary | ICD-10-CM

## 2021-12-29 DIAGNOSIS — K219 Gastro-esophageal reflux disease without esophagitis: Secondary | ICD-10-CM | POA: Diagnosis not present

## 2021-12-29 DIAGNOSIS — M545 Low back pain, unspecified: Secondary | ICD-10-CM | POA: Diagnosis not present

## 2021-12-29 DIAGNOSIS — N186 End stage renal disease: Secondary | ICD-10-CM

## 2021-12-29 DIAGNOSIS — H524 Presbyopia: Secondary | ICD-10-CM | POA: Diagnosis not present

## 2021-12-29 DIAGNOSIS — I150 Renovascular hypertension: Secondary | ICD-10-CM | POA: Diagnosis not present

## 2021-12-29 DIAGNOSIS — G8929 Other chronic pain: Secondary | ICD-10-CM

## 2021-12-29 DIAGNOSIS — M5416 Radiculopathy, lumbar region: Secondary | ICD-10-CM

## 2021-12-29 DIAGNOSIS — I5022 Chronic systolic (congestive) heart failure: Secondary | ICD-10-CM | POA: Diagnosis not present

## 2021-12-29 DIAGNOSIS — I110 Hypertensive heart disease with heart failure: Secondary | ICD-10-CM

## 2021-12-29 DIAGNOSIS — E785 Hyperlipidemia, unspecified: Secondary | ICD-10-CM

## 2021-12-29 DIAGNOSIS — G4489 Other headache syndrome: Secondary | ICD-10-CM

## 2021-12-29 DIAGNOSIS — R6889 Other general symptoms and signs: Secondary | ICD-10-CM

## 2021-12-29 DIAGNOSIS — Z992 Dependence on renal dialysis: Secondary | ICD-10-CM

## 2021-12-29 NOTE — Progress Notes (Signed)
[name] 56 y.o.   Chief Complaint  Patient presents with   Acute Visit    Poss ear infection    HISTORY OF PRESENT ILLNESS: This is a 56 y.o. male complaining of chronic excessive secretion from both ears and nose for couple years. Dialysis patient on kidney transplant list. Also has chronic lumbar pain. No other complaints or medical concerns today.  HPI   Prior to Admission medications   Medication Sig Start Date End Date Taking? Authorizing Provider  B Complex-C-Zn-Folic Acid (DIALYVITE 263 WITH ZINC) 0.8 MG TABS Take 1 tablet by mouth daily. 06/22/20  Yes [provider]  benzoyl peroxide (BENZOYL PEROXIDE) 5 % external liquid Apply topically 2 (two) times daily. 12/30/19  Yes [place], Ines Bloomer, MD  Betamethasone Sodium Phosphate 6 MG/ML SOLN INJECT 2:4 SYRINGE BILATERALLY INTO [facility] KNEE JOINT 06/21/19  Yes [provider]  calcium acetate (PHOSLO) 667 MG capsule Take 2 capsules (1,334 mg total) by mouth 3 (three) times daily with meals. 12/30/19  Yes Abigayl Hor, Ines Bloomer, MD  lidocaine-prilocaine (EMLA) cream Apply topically. 06/11/19  Yes [provider]  pantoprazole (PROTONIX) 40 MG tablet Take 1 tablet (40 mg total) by mouth daily. 04/17/19  Yes Alma Friendly, MD  VASCEPA 1 g capsule TAKE TWO (2) CAPSULES BY MOUTH TWICE DAILY *MUST KEEP APPOINTMENT FOR FURTHER REFILLS* 11/08/21  Yes [place], Nadean Corwin, MD  amLODipine (NORVASC) 5 MG tablet Take 1 tablet (5 mg total) by mouth daily. 04/18/19 11/07/21  Alma Friendly, MD  carvedilol (COREG) 25 MG tablet Take 1 tablet (25 mg total) by mouth 2 (two) times daily with a meal. 04/17/19 11/07/21  Alma Friendly, MD  hydrALAZINE (APRESOLINE) 25 MG tablet Take 1 tablet (25 mg total) by mouth every 8 (eight) hours. 04/17/19 11/07/21  Alma Friendly, MD    Allergies  Allergen Reactions   Lokelma [Sodium Zirconium Cyclosilicate] Shortness Of Breath   Beef-Derived Products Other (See Comments)     Cultural preference   Pegademase Bovine Other (See Comments)    Cultural preference   Poractant [place] Other (See Comments)    Cultural preference   Pork-Derived Products Other (See Comments)    Cultural preference    Patient Active Problem List   Diagnosis Date Noted   Renovascular hypertension 11/07/2021   Other headache syndrome 07/07/2020   Allergy, unspecified, sequela 01/27/2020   Personal history of anaphylaxis 01/27/2020   Encounter for screening for COVID-19 10/31/2019   History of nephrectomy, right 07/23/2019   Low left ventricular ejection fraction 07/23/2019   Obesity (BMI 30-39.9) 07/23/2019   Pre-transplant evaluation for kidney transplant 07/23/2019   Hypertensive heart disease with chronic systolic congestive heart failure ([place]) 07/01/2019   Iron deficiency anemia, unspecified 04/25/2019   Encounter for immunization 04/22/2019   Anemia in chronic kidney disease 04/17/2019   End stage renal disease on dialysis ([place]) 04/17/2019   Pain, unspecified 04/17/2019   Encounter for screening for respiratory tuberculosis 04/17/2019   Pruritus, unspecified 04/17/2019   Secondary hyperparathyroidism of renal origin ([place]) 04/17/2019   DCM (dilated cardiomyopathy) (HCC)    CKD (chronic kidney disease) stage 5, GFR less than 15 ml/min (HCC) 04/11/2019   History of colon polyps 03/20/2019   Gastroesophageal reflux disease 03/20/2019   CKD (chronic kidney disease) stage 4, GFR 15-29 ml/min (HCC) 08/25/2018   CKD (chronic kidney disease) stage 3, GFR 30-59 ml/min (HCC) 05/10/2017   Renal insufficiency 11/15/2016   Testicular hypofunction 09/15/2015   BPH without obstruction/lower  urinary tract symptoms 09/15/2015   Bilateral hearing loss 07/03/2015   Mixed hearing loss of right ear 07/03/2015   Dyslipidemia 03/29/2013   Nephrolithiasis 03/14/2013   Lumbar radiculopathy 02/26/2013   Vitamin D deficiency 08/03/2011   HLD (hyperlipidemia) 04/10/2010    Past Medical History:   Diagnosis Date   Anemia    Back pain    CKD (chronic kidney disease), stage III (HCC)    Stage 4   Colon polyps    adenomatous   Elevated cholesterol    History of echocardiogram    Echo 11/17: EF 65-70, normal wall motion, grade 1 diastolic dysfunction, trivial MR, mild LAE, mild TR   Hyperlipidemia    Hypertension    no current bp meds for last 3 months   Kidney stones 2007   Medication intolerance    a. multiple with prior nonadherence to regimen.   Nausea    " in the morning since having the kidney problem "   Otosclerosis of both ears    Pneumonia    PONV (postoperative nausea and vomiting)    PUD (peptic ulcer disease)    Has had unspecified surgery for this   Sleep apnea     Past Surgical History:  Procedure Laterality Date   AV FISTULA PLACEMENT Left 01/04/2019   Procedure: ARTERIOVENOUS (AV) FISTULA CREATION LEFT ARM;  Surgeon: [name], MD;  Location: [place];  Service: Vascular;  Laterality: Left;   [place] Left 03/11/2019   Procedure: BASILIC VEIN TRANSPOSITION 2ND STAGE LEFT;  Surgeon: [name], MD;  Location: [place];  Service: Vascular;  Laterality: Left;   NEPHRECTOMY  02/18/2011   Procedure: NEPHRECTOMY;  Surgeon: Hanley Ben, MD;  Location: [facility];  Service: Urology;  Laterality: Right;   SMALL INTESTINE SURGERY     STAPEDOTOMY  2005   lt ear jan, right ear sept   surgery for ulcers  1990    Social History   Socioeconomic History   Marital status: Married    Spouse name: Salya   Number of children: 5   Years of education: Not on file   Highest education level: 12th grade  Occupational History    Employer: [employer]  Tobacco Use   Smoking status: Never   Smokeless tobacco: Never  Vaping Use   Vaping Use: Never used  Substance and Sexual Activity   Alcohol use: No   Drug use: No   Sexual activity: Yes    Birth control/protection: None  Other Topics Concern   Not on file  Social History  Narrative   Lives at home with wife and family. He is from [place]. Came to the [place] in 2002.      Patient is right-handed. He lives in a one level home. He drinks 1-2 cups of coffee and tea a day. He does not exercise.   Social Determinants of Health   Financial Resource Strain: Low Risk  (08/25/2018)   Overall Financial Resource Strain (CARDIA)    Difficulty of Paying Living Expenses: Not hard at all  Food Insecurity: No Food Insecurity (08/25/2018)   Hunger Vital Sign    Worried About Running Out of Food in the Last Year: Never true    Ran Out of Food in the Last Year: Never true  Transportation Needs: No Transportation Needs (08/25/2018)   PRAPARE - Hydrologist (Medical): No    Lack of Transportation (Non-Medical): No  Physical Activity: Inactive (08/25/2018)  Exercise Vital Sign    Days of Exercise per Week: 0 days    Minutes of Exercise per Session: 0 min  Stress: No Stress Concern Present (08/25/2018)   [place]    Feeling of Stress : Not at all  Social Connections: Moderately Integrated (08/25/2018)   Social Connection and Isolation Panel [NHANES]    Frequency of Communication with Friends and Family: More than three times a week    Frequency of Social Gatherings with Friends and Family: More than three times a week    Attends Religious Services: More than 4 times per year    Active Member of [employer] or Organizations: No    Attends Archivist Meetings: Never    Marital Status: Married  Human resources officer Violence: Not At Risk (08/25/2018)   Humiliation, Afraid, Rape, and Kick questionnaire    Fear of Current or Ex-Partner: No    Emotionally Abused: No    Physically Abused: No    Sexually Abused: No    Family History  Problem Relation Age of Onset   Hyperlipidemia Father    Heart disease Mother    Hypertension Mother    Hypertension Sister    Heart disease Brother 57        CABG   Hyperlipidemia Sister    Hyperlipidemia Sister    Esophageal cancer Cousin    Healthy Child    Colon cancer Neg Hx    Stomach cancer Neg Hx    Rectal cancer Neg Hx      Review of Systems  Constitutional: Negative.  Negative for chills and fever.  HENT:  Negative for sinus pain and sore throat.        Chronic nasal and ear secretions  Respiratory: Negative.  Negative for cough and shortness of breath.   Cardiovascular: Negative.  Negative for chest pain.  Gastrointestinal:  Negative for abdominal pain, nausea and vomiting.  Musculoskeletal:  Positive for back pain and joint pain.  Skin: Negative.  Negative for rash.  Neurological: Negative.  Negative for dizziness and headaches.  All other systems reviewed and are negative.   Today's Vitals   12/29/21 1110  Weight: 243 lb 6 oz (110.4 kg)  Height: '5\' 9"'$  (1.753 m)   Body mass index is 35.94 kg/m.  Physical Exam Vitals reviewed.  Constitutional:      Appearance: Normal appearance.  HENT:     Head: Normocephalic.     Right Ear: Tympanic membrane, ear canal and external ear normal.     Left Ear: Tympanic membrane, ear canal and external ear normal.     Nose: No congestion.     Mouth/Throat:     Mouth: Mucous membranes are moist.     Pharynx: Oropharynx is clear.  Eyes:     Extraocular Movements: Extraocular movements intact.     Conjunctiva/sclera: Conjunctivae normal.     Pupils: Pupils are equal, round, and reactive to light.  Cardiovascular:     Rate and Rhythm: Normal rate and regular rhythm.     Pulses: Normal pulses.     Heart sounds: Normal heart sounds.  Pulmonary:     Effort: Pulmonary effort is normal.     Breath sounds: Normal breath sounds.  Abdominal:     Palpations: Abdomen is soft.     Tenderness: There is no abdominal tenderness.  Musculoskeletal:     Cervical back: No tenderness.  Lymphadenopathy:     Cervical: No cervical adenopathy.  Skin:    General: Skin is warm and dry.   Neurological:     General: No focal deficit present.     Mental Status: He is alert and oriented to person, place, and time.  Psychiatric:        Mood and Affect: Mood normal.        Behavior: Behavior normal.      ASSESSMENT & PLAN: A total of 47 minutes was spent with the patient and counseling/coordination of care regarding preparing for this visit, review of most recent office visit notes, review of multiple chronic medical problems and their management, review of all medications, chronic ENT complaints and need for evaluation by ENT doctor, chronic low back pain and need for evaluation by orthopedist, education on nutrition, review of health maintenance items, prognosis, documentation, and need for follow-up.  Problem List Items Addressed This Visit       Cardiovascular and Mediastinum   Renovascular hypertension (Chronic)    Fluctuating blood pressure readings. Average blood pressure readings at home within normal limits. Elevated reading in the office today. Continue hydralazine 25 mg every 8 hours, carvedilol 25 mg twice a day and amlodipine 5 mg daily.      Hypertensive heart disease with chronic systolic congestive heart failure (HCC)    Clinically euvolemic.  No signs or symptoms of congestive heart failure. Stable.        Digestive   Gastroesophageal reflux disease    Stable.  Takes pantoprazole 40 mg daily.        Nervous and Auditory   Lumbar radiculopathy    Chronic lumbar pain.  Needs evaluation by orthopedist.  Referral placed today.        Genitourinary   End stage renal disease on dialysis ([place])    Stable.  Awaiting kidney transplant.        Other   Dyslipidemia    Stable.  Diet and nutrition discussed.  Continue Vascepa 2 capsules twice a day      Other headache syndrome   ENT complaint - Primary   Relevant Orders   Ambulatory referral to ENT   Chronic bilateral low back pain without sciatica   Relevant Orders   Ambulatory referral to  Orthopedic Surgery   Other Visit Diagnoses     Awaiting organ transplant status            Patient Instructions  Health Maintenance, Male Adopting a healthy lifestyle and getting preventive care are important in promoting health and wellness. Ask your health care provider about: The right schedule for you to have regular tests and exams. Things you can do on your own to prevent diseases and keep yourself healthy. What should I know about diet, weight, and exercise? Eat a healthy diet  Eat a diet that includes plenty of vegetables, fruits, low-fat dairy products, and lean protein. Do not eat a lot of foods that are high in solid fats, added sugars, or sodium. Maintain a healthy weight Body mass index (BMI) is a measurement that can be used to identify possible weight problems. It estimates body fat based on height and weight. Your health care provider can help determine your BMI and help you achieve or maintain a healthy weight. Get regular exercise Get regular exercise. This is one of the most important things you can do for your health. Most adults should: Exercise for at least 150 minutes each week. The exercise should increase your heart rate and make you sweat (moderate-intensity exercise). Do strengthening exercises  at least twice a week. This is in addition to the moderate-intensity exercise. Spend less time sitting. Even light physical activity can be beneficial. Watch cholesterol and blood lipids Have your blood tested for lipids and cholesterol at 56 years of age, then have this test every 5 years. You may need to have your cholesterol levels checked more often if: Your lipid or cholesterol levels are high. You are older than 56 years of age. You are at high risk for heart disease. What should I know about cancer screening? Many types of cancers can be detected early and may often be prevented. Depending on your health history and family history, you may need to have cancer  screening at various ages. This may include screening for: Colorectal cancer. Prostate cancer. Skin cancer. Lung cancer. What should I know about heart disease, diabetes, and high blood pressure? Blood pressure and heart disease High blood pressure causes heart disease and increases the risk of stroke. This is more likely to develop in people who have high blood pressure readings or are overweight. Talk with your health care provider about your target blood pressure readings. Have your blood pressure checked: Every 3-5 years if you are 74-56 years of age. Every year if you are 92 years old or older. If you are between the ages of 67 and 57 and are a current or former smoker, ask your health care provider if you should have a one-time screening for abdominal aortic aneurysm (AAA). Diabetes Have regular diabetes screenings. This checks your fasting blood sugar level. Have the screening done: Once every three years after age 1 if you are at a normal weight and have a low risk for diabetes. More often and at a younger age if you are overweight or have a high risk for diabetes. What should I know about preventing infection? Hepatitis B If you have a higher risk for hepatitis B, you should be screened for this virus. Talk with your health care provider to find out if you are at risk for hepatitis B infection. Hepatitis C Blood testing is recommended for: Everyone born from [birth year] through 1965. Anyone with known risk factors for hepatitis C. Sexually transmitted infections (STIs) You should be screened each year for STIs, including gonorrhea and chlamydia, if: You are sexually active and are younger than 56 years of age. You are older than 56 years of age and your health care provider tells you that you are at risk for this type of infection. Your sexual activity has changed since you were last screened, and you are at increased risk for chlamydia or gonorrhea. Ask your health care provider if  you are at risk. Ask your health care provider about whether you are at high risk for HIV. Your health care provider may recommend a prescription medicine to help prevent HIV infection. If you choose to take medicine to prevent HIV, you should first get tested for HIV. You should then be tested every 3 months for as long as you are taking the medicine. Follow these instructions at home: Alcohol use Do not drink alcohol if your health care provider tells you not to drink. If you drink alcohol: Limit how much you have to 0-2 drinks a day. Know how much alcohol is in your drink. In the [place] one drink equals one 12 oz bottle of beer (355 mL), one 5 oz glass of wine (148 mL), or one 1 oz glass of hard liquor (44 mL). Lifestyle Do not use any products that  contain nicotine or tobacco. These products include cigarettes, chewing tobacco, and vaping devices, such as e-cigarettes. If you need help quitting, ask your health care provider. Do not use street drugs. Do not share needles. Ask your health care provider for help if you need support or information about quitting drugs. General instructions Schedule regular health, dental, and eye exams. Stay current with your vaccines. Tell your health care provider if: You often feel depressed. You have ever been abused or do not feel safe at home. Summary Adopting a healthy lifestyle and getting preventive care are important in promoting health and wellness. Follow your health care provider's instructions about healthy diet, exercising, and getting tested or screened for diseases. Follow your health care provider's instructions on monitoring your cholesterol and blood pressure. This information is not intended to replace advice given to you by your health care provider. Make sure you discuss any questions you have with your health care provider. Document Revised: 06/22/2020 Document Reviewed: 06/22/2020 Elsevier Patient Education  [place], MD [facility]

## 2021-12-29 NOTE — Patient Instructions (Signed)
Health Maintenance, Male Adopting a healthy lifestyle and getting preventive care are important in promoting health and wellness. Ask your health care provider about: The right schedule for you to have regular tests and exams. Things you can do on your own to prevent diseases and keep yourself healthy. What should I know about diet, weight, and exercise? Eat a healthy diet  Eat a diet that includes plenty of vegetables, fruits, low-fat dairy products, and lean protein. Do not eat a lot of foods that are high in solid fats, added sugars, or sodium. Maintain a healthy weight Body mass index (BMI) is a measurement that can be used to identify possible weight problems. It estimates body fat based on height and weight. Your health care provider can help determine your BMI and help you achieve or maintain a healthy weight. Get regular exercise Get regular exercise. This is one of the most important things you can do for your health. Most adults should: Exercise for at least 150 minutes each week. The exercise should increase your heart rate and make you sweat (moderate-intensity exercise). Do strengthening exercises at least twice a week. This is in addition to the moderate-intensity exercise. Spend less time sitting. Even light physical activity can be beneficial. Watch cholesterol and blood lipids Have your blood tested for lipids and cholesterol at 56 years of age, then have this test every 5 years. You may need to have your cholesterol levels checked more often if: Your lipid or cholesterol levels are high. You are older than 56 years of age. You are at high risk for heart disease. What should I know about cancer screening? Many types of cancers can be detected early and may often be prevented. Depending on your health history and family history, you may need to have cancer screening at various ages. This may include screening for: Colorectal cancer. Prostate cancer. Skin cancer. Lung  cancer. What should I know about heart disease, diabetes, and high blood pressure? Blood pressure and heart disease High blood pressure causes heart disease and increases the risk of stroke. This is more likely to develop in people who have high blood pressure readings or are overweight. Talk with your health care provider about your target blood pressure readings. Have your blood pressure checked: Every 3-5 years if you are 18-39 years of age. Every year if you are 40 years old or older. If you are between the ages of 65 and 75 and are a current or former smoker, ask your health care provider if you should have a one-time screening for abdominal aortic aneurysm (AAA). Diabetes Have regular diabetes screenings. This checks your fasting blood sugar level. Have the screening done: Once every three years after age 45 if you are at a normal weight and have a low risk for diabetes. More often and at a younger age if you are overweight or have a high risk for diabetes. What should I know about preventing infection? Hepatitis B If you have a higher risk for hepatitis B, you should be screened for this virus. Talk with your health care provider to find out if you are at risk for hepatitis B infection. Hepatitis C Blood testing is recommended for: Everyone born from 1945 through 1965. Anyone with known risk factors for hepatitis C. Sexually transmitted infections (STIs) You should be screened each year for STIs, including gonorrhea and chlamydia, if: You are sexually active and are younger than 56 years of age. You are older than 56 years of age and your   health care provider tells you that you are at risk for this type of infection. Your sexual activity has changed since you were last screened, and you are at increased risk for chlamydia or gonorrhea. Ask your health care provider if you are at risk. Ask your health care provider about whether you are at high risk for HIV. Your health care provider  may recommend a prescription medicine to help prevent HIV infection. If you choose to take medicine to prevent HIV, you should first get tested for HIV. You should then be tested every 3 months for as long as you are taking the medicine. Follow these instructions at home: Alcohol use Do not drink alcohol if your health care provider tells you not to drink. If you drink alcohol: Limit how much you have to 0-2 drinks a day. Know how much alcohol is in your drink. In the [place] one drink equals one 12 oz bottle of beer (355 mL), one 5 oz glass of wine (148 mL), or one 1 oz glass of hard liquor (44 mL). Lifestyle Do not use any products that contain nicotine or tobacco. These products include cigarettes, chewing tobacco, and vaping devices, such as e-cigarettes. If you need help quitting, ask your health care provider. Do not use street drugs. Do not share needles. Ask your health care provider for help if you need support or information about quitting drugs. General instructions Schedule regular health, dental, and eye exams. Stay current with your vaccines. Tell your health care provider if: You often feel depressed. You have ever been abused or do not feel safe at home. Summary Adopting a healthy lifestyle and getting preventive care are important in promoting health and wellness. Follow your health care provider's instructions about healthy diet, exercising, and getting tested or screened for diseases. Follow your health care provider's instructions on monitoring your cholesterol and blood pressure. This information is not intended to replace advice given to you by your health care provider. Make sure you discuss any questions you have with your health care provider. Document Revised: 06/22/2020 Document Reviewed: 06/22/2020 Elsevier Patient Education  2023 Elsevier Inc.  

## 2021-12-29 NOTE — Assessment & Plan Note (Signed)
Clinically euvolemic.  No signs or symptoms of congestive heart failure. Stable.

## 2021-12-29 NOTE — Assessment & Plan Note (Signed)
Stable.  Diet and nutrition discussed.  Continue Vascepa 2 capsules twice a day

## 2021-12-29 NOTE — Assessment & Plan Note (Signed)
Chronic lumbar pain.  Needs evaluation by orthopedist.  Referral placed today.

## 2021-12-29 NOTE — Assessment & Plan Note (Signed)
Fluctuating blood pressure readings. Average blood pressure readings at home within normal limits. Elevated reading in the office today. Continue hydralazine 25 mg every 8 hours, carvedilol 25 mg twice a day and amlodipine 5 mg daily.

## 2021-12-29 NOTE — Assessment & Plan Note (Signed)
Stable.  Takes pantoprazole 40 mg daily.

## 2021-12-29 NOTE — Assessment & Plan Note (Signed)
Stable.  Awaiting kidney transplant.

## 2021-12-30 DIAGNOSIS — N2581 Secondary hyperparathyroidism of renal origin: Secondary | ICD-10-CM | POA: Diagnosis not present

## 2021-12-30 DIAGNOSIS — N186 End stage renal disease: Secondary | ICD-10-CM | POA: Diagnosis not present

## 2021-12-30 DIAGNOSIS — Z992 Dependence on renal dialysis: Secondary | ICD-10-CM | POA: Diagnosis not present

## 2021-12-30 DIAGNOSIS — D631 Anemia in chronic kidney disease: Secondary | ICD-10-CM | POA: Diagnosis not present

## 2021-12-31 ENCOUNTER — Ambulatory Visit: Payer: Medicare HMO | Admitting: Surgery

## 2022-01-01 DIAGNOSIS — Z992 Dependence on renal dialysis: Secondary | ICD-10-CM | POA: Diagnosis not present

## 2022-01-01 DIAGNOSIS — N186 End stage renal disease: Secondary | ICD-10-CM | POA: Diagnosis not present

## 2022-01-01 DIAGNOSIS — N2581 Secondary hyperparathyroidism of renal origin: Secondary | ICD-10-CM | POA: Diagnosis not present

## 2022-01-01 DIAGNOSIS — D631 Anemia in chronic kidney disease: Secondary | ICD-10-CM | POA: Diagnosis not present

## 2022-01-05 DIAGNOSIS — N186 End stage renal disease: Secondary | ICD-10-CM | POA: Diagnosis not present

## 2022-01-05 DIAGNOSIS — Z992 Dependence on renal dialysis: Secondary | ICD-10-CM | POA: Diagnosis not present

## 2022-01-05 DIAGNOSIS — N2581 Secondary hyperparathyroidism of renal origin: Secondary | ICD-10-CM | POA: Diagnosis not present

## 2022-01-08 DIAGNOSIS — Z992 Dependence on renal dialysis: Secondary | ICD-10-CM | POA: Diagnosis not present

## 2022-01-08 DIAGNOSIS — N2581 Secondary hyperparathyroidism of renal origin: Secondary | ICD-10-CM | POA: Diagnosis not present

## 2022-01-08 DIAGNOSIS — N186 End stage renal disease: Secondary | ICD-10-CM | POA: Diagnosis not present

## 2022-01-11 DIAGNOSIS — N186 End stage renal disease: Secondary | ICD-10-CM | POA: Diagnosis not present

## 2022-01-11 DIAGNOSIS — Z992 Dependence on renal dialysis: Secondary | ICD-10-CM | POA: Diagnosis not present

## 2022-01-11 DIAGNOSIS — N2581 Secondary hyperparathyroidism of renal origin: Secondary | ICD-10-CM | POA: Diagnosis not present

## 2022-01-12 ENCOUNTER — Telehealth: Payer: Self-pay | Admitting: Emergency Medicine

## 2022-01-12 NOTE — Telephone Encounter (Signed)
Left message for patient to call back to schedule Medicare Annual Wellness Visit   No hx of AWV eligible as of 07/15/20  Please schedule at anytime with [facility] Advisor if patient calls the office back.     Any questions, please call me at [phone]

## 2022-01-13 DIAGNOSIS — N186 End stage renal disease: Secondary | ICD-10-CM | POA: Diagnosis not present

## 2022-01-13 DIAGNOSIS — I129 Hypertensive chronic kidney disease with stage 1 through stage 4 chronic kidney disease, or unspecified chronic kidney disease: Secondary | ICD-10-CM | POA: Diagnosis not present

## 2022-01-13 DIAGNOSIS — N2581 Secondary hyperparathyroidism of renal origin: Secondary | ICD-10-CM | POA: Diagnosis not present

## 2022-01-13 DIAGNOSIS — Z992 Dependence on renal dialysis: Secondary | ICD-10-CM | POA: Diagnosis not present

## 2022-01-15 DIAGNOSIS — N2581 Secondary hyperparathyroidism of renal origin: Secondary | ICD-10-CM | POA: Diagnosis not present

## 2022-01-15 DIAGNOSIS — N186 End stage renal disease: Secondary | ICD-10-CM | POA: Diagnosis not present

## 2022-01-15 DIAGNOSIS — Z992 Dependence on renal dialysis: Secondary | ICD-10-CM | POA: Diagnosis not present

## 2022-01-18 DIAGNOSIS — N186 End stage renal disease: Secondary | ICD-10-CM | POA: Diagnosis not present

## 2022-01-18 DIAGNOSIS — N2581 Secondary hyperparathyroidism of renal origin: Secondary | ICD-10-CM | POA: Diagnosis not present

## 2022-01-18 DIAGNOSIS — Z992 Dependence on renal dialysis: Secondary | ICD-10-CM | POA: Diagnosis not present

## 2022-01-20 DIAGNOSIS — N186 End stage renal disease: Secondary | ICD-10-CM | POA: Diagnosis not present

## 2022-01-20 DIAGNOSIS — N2581 Secondary hyperparathyroidism of renal origin: Secondary | ICD-10-CM | POA: Diagnosis not present

## 2022-01-20 DIAGNOSIS — Z992 Dependence on renal dialysis: Secondary | ICD-10-CM | POA: Diagnosis not present

## 2022-01-22 DIAGNOSIS — N186 End stage renal disease: Secondary | ICD-10-CM | POA: Diagnosis not present

## 2022-01-22 DIAGNOSIS — N2581 Secondary hyperparathyroidism of renal origin: Secondary | ICD-10-CM | POA: Diagnosis not present

## 2022-01-22 DIAGNOSIS — Z992 Dependence on renal dialysis: Secondary | ICD-10-CM | POA: Diagnosis not present

## 2022-01-25 DIAGNOSIS — Z87442 Personal history of urinary calculi: Secondary | ICD-10-CM | POA: Diagnosis not present

## 2022-01-25 DIAGNOSIS — N186 End stage renal disease: Secondary | ICD-10-CM | POA: Diagnosis not present

## 2022-01-25 DIAGNOSIS — Z992 Dependence on renal dialysis: Secondary | ICD-10-CM | POA: Diagnosis not present

## 2022-01-25 DIAGNOSIS — Z1159 Encounter for screening for other viral diseases: Secondary | ICD-10-CM | POA: Diagnosis not present

## 2022-01-25 DIAGNOSIS — I120 Hypertensive chronic kidney disease with stage 5 chronic kidney disease or end stage renal disease: Secondary | ICD-10-CM | POA: Diagnosis not present

## 2022-01-25 DIAGNOSIS — Z125 Encounter for screening for malignant neoplasm of prostate: Secondary | ICD-10-CM | POA: Diagnosis not present

## 2022-01-25 DIAGNOSIS — Z01810 Encounter for preprocedural cardiovascular examination: Secondary | ICD-10-CM | POA: Diagnosis not present

## 2022-01-25 DIAGNOSIS — Z7682 Awaiting organ transplant status: Secondary | ICD-10-CM | POA: Diagnosis not present

## 2022-01-25 DIAGNOSIS — Z01818 Encounter for other preprocedural examination: Secondary | ICD-10-CM | POA: Diagnosis not present

## 2022-01-25 DIAGNOSIS — Z905 Acquired absence of kidney: Secondary | ICD-10-CM | POA: Diagnosis not present

## 2022-01-26 DIAGNOSIS — N2581 Secondary hyperparathyroidism of renal origin: Secondary | ICD-10-CM | POA: Diagnosis not present

## 2022-01-26 DIAGNOSIS — Z992 Dependence on renal dialysis: Secondary | ICD-10-CM | POA: Diagnosis not present

## 2022-01-26 DIAGNOSIS — N186 End stage renal disease: Secondary | ICD-10-CM | POA: Diagnosis not present

## 2022-01-29 DIAGNOSIS — Z992 Dependence on renal dialysis: Secondary | ICD-10-CM | POA: Diagnosis not present

## 2022-01-29 DIAGNOSIS — N2581 Secondary hyperparathyroidism of renal origin: Secondary | ICD-10-CM | POA: Diagnosis not present

## 2022-01-29 DIAGNOSIS — N186 End stage renal disease: Secondary | ICD-10-CM | POA: Diagnosis not present

## 2022-02-01 DIAGNOSIS — N186 End stage renal disease: Secondary | ICD-10-CM | POA: Diagnosis not present

## 2022-02-01 DIAGNOSIS — Z992 Dependence on renal dialysis: Secondary | ICD-10-CM | POA: Diagnosis not present

## 2022-02-01 DIAGNOSIS — N2581 Secondary hyperparathyroidism of renal origin: Secondary | ICD-10-CM | POA: Diagnosis not present

## 2022-02-03 DIAGNOSIS — N186 End stage renal disease: Secondary | ICD-10-CM | POA: Diagnosis not present

## 2022-02-03 DIAGNOSIS — N2581 Secondary hyperparathyroidism of renal origin: Secondary | ICD-10-CM | POA: Diagnosis not present

## 2022-02-03 DIAGNOSIS — Z992 Dependence on renal dialysis: Secondary | ICD-10-CM | POA: Diagnosis not present

## 2022-02-05 DIAGNOSIS — Z992 Dependence on renal dialysis: Secondary | ICD-10-CM | POA: Diagnosis not present

## 2022-02-05 DIAGNOSIS — N2581 Secondary hyperparathyroidism of renal origin: Secondary | ICD-10-CM | POA: Diagnosis not present

## 2022-02-05 DIAGNOSIS — N186 End stage renal disease: Secondary | ICD-10-CM | POA: Diagnosis not present

## 2022-02-08 DIAGNOSIS — N2581 Secondary hyperparathyroidism of renal origin: Secondary | ICD-10-CM | POA: Diagnosis not present

## 2022-02-08 DIAGNOSIS — D631 Anemia in chronic kidney disease: Secondary | ICD-10-CM | POA: Diagnosis not present

## 2022-02-08 DIAGNOSIS — N186 End stage renal disease: Secondary | ICD-10-CM | POA: Diagnosis not present

## 2022-02-08 DIAGNOSIS — Z992 Dependence on renal dialysis: Secondary | ICD-10-CM | POA: Diagnosis not present

## 2022-02-10 DIAGNOSIS — N186 End stage renal disease: Secondary | ICD-10-CM | POA: Diagnosis not present

## 2022-02-10 DIAGNOSIS — Z992 Dependence on renal dialysis: Secondary | ICD-10-CM | POA: Diagnosis not present

## 2022-02-10 DIAGNOSIS — N2581 Secondary hyperparathyroidism of renal origin: Secondary | ICD-10-CM | POA: Diagnosis not present

## 2022-02-10 DIAGNOSIS — D631 Anemia in chronic kidney disease: Secondary | ICD-10-CM | POA: Diagnosis not present

## 2022-02-12 DIAGNOSIS — N2581 Secondary hyperparathyroidism of renal origin: Secondary | ICD-10-CM | POA: Diagnosis not present

## 2022-02-12 DIAGNOSIS — Z992 Dependence on renal dialysis: Secondary | ICD-10-CM | POA: Diagnosis not present

## 2022-02-12 DIAGNOSIS — D631 Anemia in chronic kidney disease: Secondary | ICD-10-CM | POA: Diagnosis not present

## 2022-02-12 DIAGNOSIS — N186 End stage renal disease: Secondary | ICD-10-CM | POA: Diagnosis not present

## 2022-02-13 DIAGNOSIS — Z992 Dependence on renal dialysis: Secondary | ICD-10-CM | POA: Diagnosis not present

## 2022-02-13 DIAGNOSIS — I129 Hypertensive chronic kidney disease with stage 1 through stage 4 chronic kidney disease, or unspecified chronic kidney disease: Secondary | ICD-10-CM | POA: Diagnosis not present

## 2022-02-13 DIAGNOSIS — N186 End stage renal disease: Secondary | ICD-10-CM | POA: Diagnosis not present

## 2022-02-15 DIAGNOSIS — D631 Anemia in chronic kidney disease: Secondary | ICD-10-CM | POA: Diagnosis not present

## 2022-02-15 DIAGNOSIS — R197 Diarrhea, unspecified: Secondary | ICD-10-CM | POA: Diagnosis not present

## 2022-02-15 DIAGNOSIS — Z23 Encounter for immunization: Secondary | ICD-10-CM | POA: Diagnosis not present

## 2022-02-15 DIAGNOSIS — Z992 Dependence on renal dialysis: Secondary | ICD-10-CM | POA: Diagnosis not present

## 2022-02-15 DIAGNOSIS — D509 Iron deficiency anemia, unspecified: Secondary | ICD-10-CM | POA: Diagnosis not present

## 2022-02-15 DIAGNOSIS — N2581 Secondary hyperparathyroidism of renal origin: Secondary | ICD-10-CM | POA: Diagnosis not present

## 2022-02-15 DIAGNOSIS — N186 End stage renal disease: Secondary | ICD-10-CM | POA: Diagnosis not present

## 2022-02-17 DIAGNOSIS — R197 Diarrhea, unspecified: Secondary | ICD-10-CM | POA: Diagnosis not present

## 2022-02-17 DIAGNOSIS — N2581 Secondary hyperparathyroidism of renal origin: Secondary | ICD-10-CM | POA: Diagnosis not present

## 2022-02-17 DIAGNOSIS — D509 Iron deficiency anemia, unspecified: Secondary | ICD-10-CM | POA: Diagnosis not present

## 2022-02-17 DIAGNOSIS — Z23 Encounter for immunization: Secondary | ICD-10-CM | POA: Diagnosis not present

## 2022-02-17 DIAGNOSIS — Z992 Dependence on renal dialysis: Secondary | ICD-10-CM | POA: Diagnosis not present

## 2022-02-17 DIAGNOSIS — N186 End stage renal disease: Secondary | ICD-10-CM | POA: Diagnosis not present

## 2022-02-17 DIAGNOSIS — D631 Anemia in chronic kidney disease: Secondary | ICD-10-CM | POA: Diagnosis not present

## 2022-02-18 DIAGNOSIS — K219 Gastro-esophageal reflux disease without esophagitis: Secondary | ICD-10-CM | POA: Diagnosis not present

## 2022-02-18 DIAGNOSIS — Z6836 Body mass index (BMI) 36.0-36.9, adult: Secondary | ICD-10-CM | POA: Diagnosis not present

## 2022-02-18 DIAGNOSIS — I150 Renovascular hypertension: Secondary | ICD-10-CM | POA: Diagnosis not present

## 2022-02-18 DIAGNOSIS — Z5982 Transportation insecurity: Secondary | ICD-10-CM | POA: Diagnosis not present

## 2022-02-18 DIAGNOSIS — I132 Hypertensive heart and chronic kidney disease with heart failure and with stage 5 chronic kidney disease, or end stage renal disease: Secondary | ICD-10-CM | POA: Diagnosis not present

## 2022-02-18 DIAGNOSIS — Z008 Encounter for other general examination: Secondary | ICD-10-CM | POA: Diagnosis not present

## 2022-02-18 DIAGNOSIS — E785 Hyperlipidemia, unspecified: Secondary | ICD-10-CM | POA: Diagnosis not present

## 2022-02-18 DIAGNOSIS — M1990 Unspecified osteoarthritis, unspecified site: Secondary | ICD-10-CM | POA: Diagnosis not present

## 2022-02-18 DIAGNOSIS — I700 Atherosclerosis of aorta: Secondary | ICD-10-CM | POA: Diagnosis not present

## 2022-02-18 DIAGNOSIS — N2581 Secondary hyperparathyroidism of renal origin: Secondary | ICD-10-CM | POA: Diagnosis not present

## 2022-02-18 DIAGNOSIS — G4733 Obstructive sleep apnea (adult) (pediatric): Secondary | ICD-10-CM | POA: Diagnosis not present

## 2022-02-18 DIAGNOSIS — I509 Heart failure, unspecified: Secondary | ICD-10-CM | POA: Diagnosis not present

## 2022-02-19 DIAGNOSIS — D509 Iron deficiency anemia, unspecified: Secondary | ICD-10-CM | POA: Diagnosis not present

## 2022-02-19 DIAGNOSIS — N2581 Secondary hyperparathyroidism of renal origin: Secondary | ICD-10-CM | POA: Diagnosis not present

## 2022-02-19 DIAGNOSIS — Z992 Dependence on renal dialysis: Secondary | ICD-10-CM | POA: Diagnosis not present

## 2022-02-19 DIAGNOSIS — N186 End stage renal disease: Secondary | ICD-10-CM | POA: Diagnosis not present

## 2022-02-19 DIAGNOSIS — D631 Anemia in chronic kidney disease: Secondary | ICD-10-CM | POA: Diagnosis not present

## 2022-02-19 DIAGNOSIS — Z23 Encounter for immunization: Secondary | ICD-10-CM | POA: Diagnosis not present

## 2022-02-19 DIAGNOSIS — R197 Diarrhea, unspecified: Secondary | ICD-10-CM | POA: Diagnosis not present

## 2022-02-22 DIAGNOSIS — Z992 Dependence on renal dialysis: Secondary | ICD-10-CM | POA: Diagnosis not present

## 2022-02-22 DIAGNOSIS — D631 Anemia in chronic kidney disease: Secondary | ICD-10-CM | POA: Diagnosis not present

## 2022-02-22 DIAGNOSIS — N186 End stage renal disease: Secondary | ICD-10-CM | POA: Diagnosis not present

## 2022-02-22 DIAGNOSIS — N2581 Secondary hyperparathyroidism of renal origin: Secondary | ICD-10-CM | POA: Diagnosis not present

## 2022-02-22 DIAGNOSIS — Z23 Encounter for immunization: Secondary | ICD-10-CM | POA: Diagnosis not present

## 2022-02-22 DIAGNOSIS — D509 Iron deficiency anemia, unspecified: Secondary | ICD-10-CM | POA: Diagnosis not present

## 2022-02-22 DIAGNOSIS — R197 Diarrhea, unspecified: Secondary | ICD-10-CM | POA: Diagnosis not present

## 2022-02-24 DIAGNOSIS — Z992 Dependence on renal dialysis: Secondary | ICD-10-CM | POA: Diagnosis not present

## 2022-02-24 DIAGNOSIS — D631 Anemia in chronic kidney disease: Secondary | ICD-10-CM | POA: Diagnosis not present

## 2022-02-24 DIAGNOSIS — N186 End stage renal disease: Secondary | ICD-10-CM | POA: Diagnosis not present

## 2022-02-24 DIAGNOSIS — D509 Iron deficiency anemia, unspecified: Secondary | ICD-10-CM | POA: Diagnosis not present

## 2022-02-24 DIAGNOSIS — R197 Diarrhea, unspecified: Secondary | ICD-10-CM | POA: Diagnosis not present

## 2022-02-24 DIAGNOSIS — Z23 Encounter for immunization: Secondary | ICD-10-CM | POA: Diagnosis not present

## 2022-02-24 DIAGNOSIS — N2581 Secondary hyperparathyroidism of renal origin: Secondary | ICD-10-CM | POA: Diagnosis not present

## 2022-02-26 DIAGNOSIS — Z23 Encounter for immunization: Secondary | ICD-10-CM | POA: Diagnosis not present

## 2022-02-26 DIAGNOSIS — N186 End stage renal disease: Secondary | ICD-10-CM | POA: Diagnosis not present

## 2022-02-26 DIAGNOSIS — D631 Anemia in chronic kidney disease: Secondary | ICD-10-CM | POA: Diagnosis not present

## 2022-02-26 DIAGNOSIS — Z992 Dependence on renal dialysis: Secondary | ICD-10-CM | POA: Diagnosis not present

## 2022-02-26 DIAGNOSIS — N2581 Secondary hyperparathyroidism of renal origin: Secondary | ICD-10-CM | POA: Diagnosis not present

## 2022-02-26 DIAGNOSIS — D509 Iron deficiency anemia, unspecified: Secondary | ICD-10-CM | POA: Diagnosis not present

## 2022-02-26 DIAGNOSIS — R197 Diarrhea, unspecified: Secondary | ICD-10-CM | POA: Diagnosis not present

## 2022-03-01 DIAGNOSIS — D509 Iron deficiency anemia, unspecified: Secondary | ICD-10-CM | POA: Diagnosis not present

## 2022-03-01 DIAGNOSIS — Z23 Encounter for immunization: Secondary | ICD-10-CM | POA: Diagnosis not present

## 2022-03-01 DIAGNOSIS — D631 Anemia in chronic kidney disease: Secondary | ICD-10-CM | POA: Diagnosis not present

## 2022-03-01 DIAGNOSIS — Z992 Dependence on renal dialysis: Secondary | ICD-10-CM | POA: Diagnosis not present

## 2022-03-01 DIAGNOSIS — N2581 Secondary hyperparathyroidism of renal origin: Secondary | ICD-10-CM | POA: Diagnosis not present

## 2022-03-01 DIAGNOSIS — N186 End stage renal disease: Secondary | ICD-10-CM | POA: Diagnosis not present

## 2022-03-01 DIAGNOSIS — R197 Diarrhea, unspecified: Secondary | ICD-10-CM | POA: Diagnosis not present

## 2022-03-03 DIAGNOSIS — D509 Iron deficiency anemia, unspecified: Secondary | ICD-10-CM | POA: Diagnosis not present

## 2022-03-03 DIAGNOSIS — Z992 Dependence on renal dialysis: Secondary | ICD-10-CM | POA: Diagnosis not present

## 2022-03-03 DIAGNOSIS — R197 Diarrhea, unspecified: Secondary | ICD-10-CM | POA: Diagnosis not present

## 2022-03-03 DIAGNOSIS — Z23 Encounter for immunization: Secondary | ICD-10-CM | POA: Diagnosis not present

## 2022-03-03 DIAGNOSIS — D631 Anemia in chronic kidney disease: Secondary | ICD-10-CM | POA: Diagnosis not present

## 2022-03-03 DIAGNOSIS — N186 End stage renal disease: Secondary | ICD-10-CM | POA: Diagnosis not present

## 2022-03-03 DIAGNOSIS — N2581 Secondary hyperparathyroidism of renal origin: Secondary | ICD-10-CM | POA: Diagnosis not present

## 2022-03-05 DIAGNOSIS — R197 Diarrhea, unspecified: Secondary | ICD-10-CM | POA: Diagnosis not present

## 2022-03-05 DIAGNOSIS — D509 Iron deficiency anemia, unspecified: Secondary | ICD-10-CM | POA: Diagnosis not present

## 2022-03-05 DIAGNOSIS — N2581 Secondary hyperparathyroidism of renal origin: Secondary | ICD-10-CM | POA: Diagnosis not present

## 2022-03-05 DIAGNOSIS — Z992 Dependence on renal dialysis: Secondary | ICD-10-CM | POA: Diagnosis not present

## 2022-03-05 DIAGNOSIS — N186 End stage renal disease: Secondary | ICD-10-CM | POA: Diagnosis not present

## 2022-03-05 DIAGNOSIS — Z23 Encounter for immunization: Secondary | ICD-10-CM | POA: Diagnosis not present

## 2022-03-05 DIAGNOSIS — D631 Anemia in chronic kidney disease: Secondary | ICD-10-CM | POA: Diagnosis not present

## 2022-03-08 DIAGNOSIS — R197 Diarrhea, unspecified: Secondary | ICD-10-CM | POA: Diagnosis not present

## 2022-03-08 DIAGNOSIS — Z992 Dependence on renal dialysis: Secondary | ICD-10-CM | POA: Diagnosis not present

## 2022-03-08 DIAGNOSIS — D509 Iron deficiency anemia, unspecified: Secondary | ICD-10-CM | POA: Diagnosis not present

## 2022-03-08 DIAGNOSIS — Z23 Encounter for immunization: Secondary | ICD-10-CM | POA: Diagnosis not present

## 2022-03-08 DIAGNOSIS — D631 Anemia in chronic kidney disease: Secondary | ICD-10-CM | POA: Diagnosis not present

## 2022-03-08 DIAGNOSIS — N186 End stage renal disease: Secondary | ICD-10-CM | POA: Diagnosis not present

## 2022-03-08 DIAGNOSIS — N2581 Secondary hyperparathyroidism of renal origin: Secondary | ICD-10-CM | POA: Diagnosis not present

## 2022-03-09 DIAGNOSIS — J342 Deviated nasal septum: Secondary | ICD-10-CM | POA: Diagnosis not present

## 2022-03-09 DIAGNOSIS — R0981 Nasal congestion: Secondary | ICD-10-CM | POA: Diagnosis not present

## 2022-03-09 DIAGNOSIS — H903 Sensorineural hearing loss, bilateral: Secondary | ICD-10-CM | POA: Diagnosis not present

## 2022-03-10 DIAGNOSIS — N2581 Secondary hyperparathyroidism of renal origin: Secondary | ICD-10-CM | POA: Diagnosis not present

## 2022-03-10 DIAGNOSIS — Z992 Dependence on renal dialysis: Secondary | ICD-10-CM | POA: Diagnosis not present

## 2022-03-10 DIAGNOSIS — Z23 Encounter for immunization: Secondary | ICD-10-CM | POA: Diagnosis not present

## 2022-03-10 DIAGNOSIS — N186 End stage renal disease: Secondary | ICD-10-CM | POA: Diagnosis not present

## 2022-03-10 DIAGNOSIS — D631 Anemia in chronic kidney disease: Secondary | ICD-10-CM | POA: Diagnosis not present

## 2022-03-10 DIAGNOSIS — R197 Diarrhea, unspecified: Secondary | ICD-10-CM | POA: Diagnosis not present

## 2022-03-10 DIAGNOSIS — D509 Iron deficiency anemia, unspecified: Secondary | ICD-10-CM | POA: Diagnosis not present

## 2022-03-12 DIAGNOSIS — D631 Anemia in chronic kidney disease: Secondary | ICD-10-CM | POA: Diagnosis not present

## 2022-03-12 DIAGNOSIS — N2581 Secondary hyperparathyroidism of renal origin: Secondary | ICD-10-CM | POA: Diagnosis not present

## 2022-03-12 DIAGNOSIS — Z23 Encounter for immunization: Secondary | ICD-10-CM | POA: Diagnosis not present

## 2022-03-12 DIAGNOSIS — Z992 Dependence on renal dialysis: Secondary | ICD-10-CM | POA: Diagnosis not present

## 2022-03-12 DIAGNOSIS — D509 Iron deficiency anemia, unspecified: Secondary | ICD-10-CM | POA: Diagnosis not present

## 2022-03-12 DIAGNOSIS — N186 End stage renal disease: Secondary | ICD-10-CM | POA: Diagnosis not present

## 2022-03-12 DIAGNOSIS — R197 Diarrhea, unspecified: Secondary | ICD-10-CM | POA: Diagnosis not present

## 2022-03-15 DIAGNOSIS — Z23 Encounter for immunization: Secondary | ICD-10-CM | POA: Diagnosis not present

## 2022-03-15 DIAGNOSIS — N2581 Secondary hyperparathyroidism of renal origin: Secondary | ICD-10-CM | POA: Diagnosis not present

## 2022-03-15 DIAGNOSIS — R197 Diarrhea, unspecified: Secondary | ICD-10-CM | POA: Diagnosis not present

## 2022-03-15 DIAGNOSIS — D631 Anemia in chronic kidney disease: Secondary | ICD-10-CM | POA: Diagnosis not present

## 2022-03-15 DIAGNOSIS — N186 End stage renal disease: Secondary | ICD-10-CM | POA: Diagnosis not present

## 2022-03-15 DIAGNOSIS — D509 Iron deficiency anemia, unspecified: Secondary | ICD-10-CM | POA: Diagnosis not present

## 2022-03-15 DIAGNOSIS — Z992 Dependence on renal dialysis: Secondary | ICD-10-CM | POA: Diagnosis not present

## 2022-03-16 DIAGNOSIS — Z992 Dependence on renal dialysis: Secondary | ICD-10-CM | POA: Diagnosis not present

## 2022-03-16 DIAGNOSIS — K4091 Unilateral inguinal hernia, without obstruction or gangrene, recurrent: Secondary | ICD-10-CM | POA: Diagnosis not present

## 2022-03-16 DIAGNOSIS — N186 End stage renal disease: Secondary | ICD-10-CM | POA: Diagnosis not present

## 2022-03-16 DIAGNOSIS — R935 Abnormal findings on diagnostic imaging of other abdominal regions, including retroperitoneum: Secondary | ICD-10-CM | POA: Diagnosis not present

## 2022-03-16 DIAGNOSIS — Z01818 Encounter for other preprocedural examination: Secondary | ICD-10-CM | POA: Diagnosis not present

## 2022-03-16 DIAGNOSIS — I129 Hypertensive chronic kidney disease with stage 1 through stage 4 chronic kidney disease, or unspecified chronic kidney disease: Secondary | ICD-10-CM | POA: Diagnosis not present

## 2022-03-17 DIAGNOSIS — D509 Iron deficiency anemia, unspecified: Secondary | ICD-10-CM | POA: Diagnosis not present

## 2022-03-17 DIAGNOSIS — N186 End stage renal disease: Secondary | ICD-10-CM | POA: Diagnosis not present

## 2022-03-17 DIAGNOSIS — Z992 Dependence on renal dialysis: Secondary | ICD-10-CM | POA: Diagnosis not present

## 2022-03-17 DIAGNOSIS — Z111 Encounter for screening for respiratory tuberculosis: Secondary | ICD-10-CM | POA: Diagnosis not present

## 2022-03-17 DIAGNOSIS — N2581 Secondary hyperparathyroidism of renal origin: Secondary | ICD-10-CM | POA: Diagnosis not present

## 2022-03-17 DIAGNOSIS — D631 Anemia in chronic kidney disease: Secondary | ICD-10-CM | POA: Diagnosis not present

## 2022-03-19 DIAGNOSIS — Z992 Dependence on renal dialysis: Secondary | ICD-10-CM | POA: Diagnosis not present

## 2022-03-19 DIAGNOSIS — D631 Anemia in chronic kidney disease: Secondary | ICD-10-CM | POA: Diagnosis not present

## 2022-03-19 DIAGNOSIS — N2581 Secondary hyperparathyroidism of renal origin: Secondary | ICD-10-CM | POA: Diagnosis not present

## 2022-03-19 DIAGNOSIS — Z111 Encounter for screening for respiratory tuberculosis: Secondary | ICD-10-CM | POA: Diagnosis not present

## 2022-03-19 DIAGNOSIS — D509 Iron deficiency anemia, unspecified: Secondary | ICD-10-CM | POA: Diagnosis not present

## 2022-03-19 DIAGNOSIS — N186 End stage renal disease: Secondary | ICD-10-CM | POA: Diagnosis not present

## 2022-03-22 DIAGNOSIS — D509 Iron deficiency anemia, unspecified: Secondary | ICD-10-CM | POA: Diagnosis not present

## 2022-03-22 DIAGNOSIS — Z992 Dependence on renal dialysis: Secondary | ICD-10-CM | POA: Diagnosis not present

## 2022-03-22 DIAGNOSIS — N186 End stage renal disease: Secondary | ICD-10-CM | POA: Diagnosis not present

## 2022-03-22 DIAGNOSIS — N2581 Secondary hyperparathyroidism of renal origin: Secondary | ICD-10-CM | POA: Diagnosis not present

## 2022-03-22 DIAGNOSIS — D631 Anemia in chronic kidney disease: Secondary | ICD-10-CM | POA: Diagnosis not present

## 2022-03-22 DIAGNOSIS — Z111 Encounter for screening for respiratory tuberculosis: Secondary | ICD-10-CM | POA: Diagnosis not present

## 2022-03-24 DIAGNOSIS — N186 End stage renal disease: Secondary | ICD-10-CM | POA: Diagnosis not present

## 2022-03-24 DIAGNOSIS — D509 Iron deficiency anemia, unspecified: Secondary | ICD-10-CM | POA: Diagnosis not present

## 2022-03-24 DIAGNOSIS — Z111 Encounter for screening for respiratory tuberculosis: Secondary | ICD-10-CM | POA: Diagnosis not present

## 2022-03-24 DIAGNOSIS — N2581 Secondary hyperparathyroidism of renal origin: Secondary | ICD-10-CM | POA: Diagnosis not present

## 2022-03-24 DIAGNOSIS — Z992 Dependence on renal dialysis: Secondary | ICD-10-CM | POA: Diagnosis not present

## 2022-03-24 DIAGNOSIS — D631 Anemia in chronic kidney disease: Secondary | ICD-10-CM | POA: Diagnosis not present

## 2022-03-26 DIAGNOSIS — N2581 Secondary hyperparathyroidism of renal origin: Secondary | ICD-10-CM | POA: Diagnosis not present

## 2022-03-26 DIAGNOSIS — D509 Iron deficiency anemia, unspecified: Secondary | ICD-10-CM | POA: Diagnosis not present

## 2022-03-26 DIAGNOSIS — Z992 Dependence on renal dialysis: Secondary | ICD-10-CM | POA: Diagnosis not present

## 2022-03-26 DIAGNOSIS — Z111 Encounter for screening for respiratory tuberculosis: Secondary | ICD-10-CM | POA: Diagnosis not present

## 2022-03-26 DIAGNOSIS — N186 End stage renal disease: Secondary | ICD-10-CM | POA: Diagnosis not present

## 2022-03-26 DIAGNOSIS — D631 Anemia in chronic kidney disease: Secondary | ICD-10-CM | POA: Diagnosis not present

## 2022-03-29 DIAGNOSIS — Z111 Encounter for screening for respiratory tuberculosis: Secondary | ICD-10-CM | POA: Diagnosis not present

## 2022-03-29 DIAGNOSIS — D631 Anemia in chronic kidney disease: Secondary | ICD-10-CM | POA: Diagnosis not present

## 2022-03-29 DIAGNOSIS — Z992 Dependence on renal dialysis: Secondary | ICD-10-CM | POA: Diagnosis not present

## 2022-03-29 DIAGNOSIS — N186 End stage renal disease: Secondary | ICD-10-CM | POA: Diagnosis not present

## 2022-03-29 DIAGNOSIS — D509 Iron deficiency anemia, unspecified: Secondary | ICD-10-CM | POA: Diagnosis not present

## 2022-03-29 DIAGNOSIS — N2581 Secondary hyperparathyroidism of renal origin: Secondary | ICD-10-CM | POA: Diagnosis not present

## 2022-03-31 DIAGNOSIS — Z992 Dependence on renal dialysis: Secondary | ICD-10-CM | POA: Diagnosis not present

## 2022-03-31 DIAGNOSIS — D509 Iron deficiency anemia, unspecified: Secondary | ICD-10-CM | POA: Diagnosis not present

## 2022-03-31 DIAGNOSIS — Z111 Encounter for screening for respiratory tuberculosis: Secondary | ICD-10-CM | POA: Diagnosis not present

## 2022-03-31 DIAGNOSIS — N2581 Secondary hyperparathyroidism of renal origin: Secondary | ICD-10-CM | POA: Diagnosis not present

## 2022-03-31 DIAGNOSIS — N186 End stage renal disease: Secondary | ICD-10-CM | POA: Diagnosis not present

## 2022-03-31 DIAGNOSIS — D631 Anemia in chronic kidney disease: Secondary | ICD-10-CM | POA: Diagnosis not present

## 2022-04-02 DIAGNOSIS — N2581 Secondary hyperparathyroidism of renal origin: Secondary | ICD-10-CM | POA: Diagnosis not present

## 2022-04-02 DIAGNOSIS — Z992 Dependence on renal dialysis: Secondary | ICD-10-CM | POA: Diagnosis not present

## 2022-04-02 DIAGNOSIS — Z111 Encounter for screening for respiratory tuberculosis: Secondary | ICD-10-CM | POA: Diagnosis not present

## 2022-04-02 DIAGNOSIS — N186 End stage renal disease: Secondary | ICD-10-CM | POA: Diagnosis not present

## 2022-04-02 DIAGNOSIS — D631 Anemia in chronic kidney disease: Secondary | ICD-10-CM | POA: Diagnosis not present

## 2022-04-02 DIAGNOSIS — D509 Iron deficiency anemia, unspecified: Secondary | ICD-10-CM | POA: Diagnosis not present

## 2022-04-05 DIAGNOSIS — D631 Anemia in chronic kidney disease: Secondary | ICD-10-CM | POA: Diagnosis not present

## 2022-04-05 DIAGNOSIS — N186 End stage renal disease: Secondary | ICD-10-CM | POA: Diagnosis not present

## 2022-04-05 DIAGNOSIS — Z992 Dependence on renal dialysis: Secondary | ICD-10-CM | POA: Diagnosis not present

## 2022-04-05 DIAGNOSIS — D509 Iron deficiency anemia, unspecified: Secondary | ICD-10-CM | POA: Diagnosis not present

## 2022-04-05 DIAGNOSIS — N2581 Secondary hyperparathyroidism of renal origin: Secondary | ICD-10-CM | POA: Diagnosis not present

## 2022-04-05 DIAGNOSIS — Z111 Encounter for screening for respiratory tuberculosis: Secondary | ICD-10-CM | POA: Diagnosis not present

## 2022-04-06 ENCOUNTER — Ambulatory Visit: Payer: Medicare HMO

## 2022-04-06 ENCOUNTER — Telehealth: Payer: Self-pay

## 2022-04-06 NOTE — Telephone Encounter (Signed)
Called patient lvm to return call, to complete AWV at [phone] or [phone].  If no return call within 15 minutes, patient may reschedule for the next available appointment with [facility] or CMA.  -S. Obadiah Dennard,LPN

## 2022-04-07 DIAGNOSIS — D509 Iron deficiency anemia, unspecified: Secondary | ICD-10-CM | POA: Diagnosis not present

## 2022-04-07 DIAGNOSIS — D631 Anemia in chronic kidney disease: Secondary | ICD-10-CM | POA: Diagnosis not present

## 2022-04-07 DIAGNOSIS — Z111 Encounter for screening for respiratory tuberculosis: Secondary | ICD-10-CM | POA: Diagnosis not present

## 2022-04-07 DIAGNOSIS — Z992 Dependence on renal dialysis: Secondary | ICD-10-CM | POA: Diagnosis not present

## 2022-04-07 DIAGNOSIS — N186 End stage renal disease: Secondary | ICD-10-CM | POA: Diagnosis not present

## 2022-04-07 DIAGNOSIS — N2581 Secondary hyperparathyroidism of renal origin: Secondary | ICD-10-CM | POA: Diagnosis not present

## 2022-04-09 DIAGNOSIS — D509 Iron deficiency anemia, unspecified: Secondary | ICD-10-CM | POA: Diagnosis not present

## 2022-04-09 DIAGNOSIS — Z992 Dependence on renal dialysis: Secondary | ICD-10-CM | POA: Diagnosis not present

## 2022-04-09 DIAGNOSIS — N186 End stage renal disease: Secondary | ICD-10-CM | POA: Diagnosis not present

## 2022-04-09 DIAGNOSIS — N2581 Secondary hyperparathyroidism of renal origin: Secondary | ICD-10-CM | POA: Diagnosis not present

## 2022-04-09 DIAGNOSIS — Z111 Encounter for screening for respiratory tuberculosis: Secondary | ICD-10-CM | POA: Diagnosis not present

## 2022-04-09 DIAGNOSIS — D631 Anemia in chronic kidney disease: Secondary | ICD-10-CM | POA: Diagnosis not present

## 2022-04-12 DIAGNOSIS — Z111 Encounter for screening for respiratory tuberculosis: Secondary | ICD-10-CM | POA: Diagnosis not present

## 2022-04-12 DIAGNOSIS — Z7682 Awaiting organ transplant status: Secondary | ICD-10-CM | POA: Diagnosis not present

## 2022-04-12 DIAGNOSIS — N2581 Secondary hyperparathyroidism of renal origin: Secondary | ICD-10-CM | POA: Diagnosis not present

## 2022-04-12 DIAGNOSIS — N186 End stage renal disease: Secondary | ICD-10-CM | POA: Diagnosis not present

## 2022-04-12 DIAGNOSIS — D631 Anemia in chronic kidney disease: Secondary | ICD-10-CM | POA: Diagnosis not present

## 2022-04-12 DIAGNOSIS — D509 Iron deficiency anemia, unspecified: Secondary | ICD-10-CM | POA: Diagnosis not present

## 2022-04-12 DIAGNOSIS — Z992 Dependence on renal dialysis: Secondary | ICD-10-CM | POA: Diagnosis not present

## 2022-04-14 ENCOUNTER — Ambulatory Visit: Payer: Medicare HMO | Admitting: Internal Medicine

## 2022-04-14 DIAGNOSIS — N2581 Secondary hyperparathyroidism of renal origin: Secondary | ICD-10-CM | POA: Diagnosis not present

## 2022-04-14 DIAGNOSIS — Z992 Dependence on renal dialysis: Secondary | ICD-10-CM | POA: Diagnosis not present

## 2022-04-14 DIAGNOSIS — I129 Hypertensive chronic kidney disease with stage 1 through stage 4 chronic kidney disease, or unspecified chronic kidney disease: Secondary | ICD-10-CM | POA: Diagnosis not present

## 2022-04-14 DIAGNOSIS — D631 Anemia in chronic kidney disease: Secondary | ICD-10-CM | POA: Diagnosis not present

## 2022-04-14 DIAGNOSIS — Z111 Encounter for screening for respiratory tuberculosis: Secondary | ICD-10-CM | POA: Diagnosis not present

## 2022-04-14 DIAGNOSIS — D509 Iron deficiency anemia, unspecified: Secondary | ICD-10-CM | POA: Diagnosis not present

## 2022-04-14 DIAGNOSIS — N186 End stage renal disease: Secondary | ICD-10-CM | POA: Diagnosis not present

## 2022-04-16 DIAGNOSIS — Z992 Dependence on renal dialysis: Secondary | ICD-10-CM | POA: Diagnosis not present

## 2022-04-16 DIAGNOSIS — D631 Anemia in chronic kidney disease: Secondary | ICD-10-CM | POA: Diagnosis not present

## 2022-04-16 DIAGNOSIS — N186 End stage renal disease: Secondary | ICD-10-CM | POA: Diagnosis not present

## 2022-04-16 DIAGNOSIS — D509 Iron deficiency anemia, unspecified: Secondary | ICD-10-CM | POA: Diagnosis not present

## 2022-04-16 DIAGNOSIS — N2581 Secondary hyperparathyroidism of renal origin: Secondary | ICD-10-CM | POA: Diagnosis not present

## 2022-04-19 DIAGNOSIS — Z992 Dependence on renal dialysis: Secondary | ICD-10-CM | POA: Diagnosis not present

## 2022-04-19 DIAGNOSIS — D631 Anemia in chronic kidney disease: Secondary | ICD-10-CM | POA: Diagnosis not present

## 2022-04-19 DIAGNOSIS — D509 Iron deficiency anemia, unspecified: Secondary | ICD-10-CM | POA: Diagnosis not present

## 2022-04-19 DIAGNOSIS — N186 End stage renal disease: Secondary | ICD-10-CM | POA: Diagnosis not present

## 2022-04-19 DIAGNOSIS — N2581 Secondary hyperparathyroidism of renal origin: Secondary | ICD-10-CM | POA: Diagnosis not present

## 2022-04-21 DIAGNOSIS — N186 End stage renal disease: Secondary | ICD-10-CM | POA: Diagnosis not present

## 2022-04-21 DIAGNOSIS — N2581 Secondary hyperparathyroidism of renal origin: Secondary | ICD-10-CM | POA: Diagnosis not present

## 2022-04-21 DIAGNOSIS — Z992 Dependence on renal dialysis: Secondary | ICD-10-CM | POA: Diagnosis not present

## 2022-04-21 DIAGNOSIS — D509 Iron deficiency anemia, unspecified: Secondary | ICD-10-CM | POA: Diagnosis not present

## 2022-04-21 DIAGNOSIS — D631 Anemia in chronic kidney disease: Secondary | ICD-10-CM | POA: Diagnosis not present

## 2022-04-23 DIAGNOSIS — D631 Anemia in chronic kidney disease: Secondary | ICD-10-CM | POA: Diagnosis not present

## 2022-04-23 DIAGNOSIS — D509 Iron deficiency anemia, unspecified: Secondary | ICD-10-CM | POA: Diagnosis not present

## 2022-04-23 DIAGNOSIS — N186 End stage renal disease: Secondary | ICD-10-CM | POA: Diagnosis not present

## 2022-04-23 DIAGNOSIS — N2581 Secondary hyperparathyroidism of renal origin: Secondary | ICD-10-CM | POA: Diagnosis not present

## 2022-04-23 DIAGNOSIS — Z992 Dependence on renal dialysis: Secondary | ICD-10-CM | POA: Diagnosis not present

## 2022-04-26 DIAGNOSIS — D631 Anemia in chronic kidney disease: Secondary | ICD-10-CM | POA: Diagnosis not present

## 2022-04-26 DIAGNOSIS — D509 Iron deficiency anemia, unspecified: Secondary | ICD-10-CM | POA: Diagnosis not present

## 2022-04-26 DIAGNOSIS — N186 End stage renal disease: Secondary | ICD-10-CM | POA: Diagnosis not present

## 2022-04-26 DIAGNOSIS — Z992 Dependence on renal dialysis: Secondary | ICD-10-CM | POA: Diagnosis not present

## 2022-04-26 DIAGNOSIS — N2581 Secondary hyperparathyroidism of renal origin: Secondary | ICD-10-CM | POA: Diagnosis not present

## 2022-04-28 DIAGNOSIS — D631 Anemia in chronic kidney disease: Secondary | ICD-10-CM | POA: Diagnosis not present

## 2022-04-28 DIAGNOSIS — N186 End stage renal disease: Secondary | ICD-10-CM | POA: Diagnosis not present

## 2022-04-28 DIAGNOSIS — D509 Iron deficiency anemia, unspecified: Secondary | ICD-10-CM | POA: Diagnosis not present

## 2022-04-28 DIAGNOSIS — N2581 Secondary hyperparathyroidism of renal origin: Secondary | ICD-10-CM | POA: Diagnosis not present

## 2022-04-28 DIAGNOSIS — Z992 Dependence on renal dialysis: Secondary | ICD-10-CM | POA: Diagnosis not present

## 2022-04-30 DIAGNOSIS — D509 Iron deficiency anemia, unspecified: Secondary | ICD-10-CM | POA: Diagnosis not present

## 2022-04-30 DIAGNOSIS — Z992 Dependence on renal dialysis: Secondary | ICD-10-CM | POA: Diagnosis not present

## 2022-04-30 DIAGNOSIS — N2581 Secondary hyperparathyroidism of renal origin: Secondary | ICD-10-CM | POA: Diagnosis not present

## 2022-04-30 DIAGNOSIS — D631 Anemia in chronic kidney disease: Secondary | ICD-10-CM | POA: Diagnosis not present

## 2022-04-30 DIAGNOSIS — N186 End stage renal disease: Secondary | ICD-10-CM | POA: Diagnosis not present

## 2022-05-03 DIAGNOSIS — N2581 Secondary hyperparathyroidism of renal origin: Secondary | ICD-10-CM | POA: Diagnosis not present

## 2022-05-03 DIAGNOSIS — D509 Iron deficiency anemia, unspecified: Secondary | ICD-10-CM | POA: Diagnosis not present

## 2022-05-03 DIAGNOSIS — D631 Anemia in chronic kidney disease: Secondary | ICD-10-CM | POA: Diagnosis not present

## 2022-05-03 DIAGNOSIS — Z992 Dependence on renal dialysis: Secondary | ICD-10-CM | POA: Diagnosis not present

## 2022-05-03 DIAGNOSIS — N186 End stage renal disease: Secondary | ICD-10-CM | POA: Diagnosis not present

## 2022-05-05 DIAGNOSIS — D509 Iron deficiency anemia, unspecified: Secondary | ICD-10-CM | POA: Diagnosis not present

## 2022-05-05 DIAGNOSIS — N2581 Secondary hyperparathyroidism of renal origin: Secondary | ICD-10-CM | POA: Diagnosis not present

## 2022-05-05 DIAGNOSIS — Z992 Dependence on renal dialysis: Secondary | ICD-10-CM | POA: Diagnosis not present

## 2022-05-05 DIAGNOSIS — N186 End stage renal disease: Secondary | ICD-10-CM | POA: Diagnosis not present

## 2022-05-05 DIAGNOSIS — D631 Anemia in chronic kidney disease: Secondary | ICD-10-CM | POA: Diagnosis not present

## 2022-05-07 DIAGNOSIS — D509 Iron deficiency anemia, unspecified: Secondary | ICD-10-CM | POA: Diagnosis not present

## 2022-05-07 DIAGNOSIS — N2581 Secondary hyperparathyroidism of renal origin: Secondary | ICD-10-CM | POA: Diagnosis not present

## 2022-05-07 DIAGNOSIS — N186 End stage renal disease: Secondary | ICD-10-CM | POA: Diagnosis not present

## 2022-05-07 DIAGNOSIS — Z992 Dependence on renal dialysis: Secondary | ICD-10-CM | POA: Diagnosis not present

## 2022-05-07 DIAGNOSIS — D631 Anemia in chronic kidney disease: Secondary | ICD-10-CM | POA: Diagnosis not present

## 2022-05-09 DIAGNOSIS — H903 Sensorineural hearing loss, bilateral: Secondary | ICD-10-CM | POA: Diagnosis not present

## 2022-05-10 DIAGNOSIS — N186 End stage renal disease: Secondary | ICD-10-CM | POA: Diagnosis not present

## 2022-05-10 DIAGNOSIS — D631 Anemia in chronic kidney disease: Secondary | ICD-10-CM | POA: Diagnosis not present

## 2022-05-10 DIAGNOSIS — N2581 Secondary hyperparathyroidism of renal origin: Secondary | ICD-10-CM | POA: Diagnosis not present

## 2022-05-10 DIAGNOSIS — D509 Iron deficiency anemia, unspecified: Secondary | ICD-10-CM | POA: Diagnosis not present

## 2022-05-10 DIAGNOSIS — Z992 Dependence on renal dialysis: Secondary | ICD-10-CM | POA: Diagnosis not present

## 2022-05-12 DIAGNOSIS — D631 Anemia in chronic kidney disease: Secondary | ICD-10-CM | POA: Diagnosis not present

## 2022-05-12 DIAGNOSIS — Z992 Dependence on renal dialysis: Secondary | ICD-10-CM | POA: Diagnosis not present

## 2022-05-12 DIAGNOSIS — N2581 Secondary hyperparathyroidism of renal origin: Secondary | ICD-10-CM | POA: Diagnosis not present

## 2022-05-12 DIAGNOSIS — D509 Iron deficiency anemia, unspecified: Secondary | ICD-10-CM | POA: Diagnosis not present

## 2022-05-12 DIAGNOSIS — N186 End stage renal disease: Secondary | ICD-10-CM | POA: Diagnosis not present

## 2022-05-14 DIAGNOSIS — D509 Iron deficiency anemia, unspecified: Secondary | ICD-10-CM | POA: Diagnosis not present

## 2022-05-14 DIAGNOSIS — N2581 Secondary hyperparathyroidism of renal origin: Secondary | ICD-10-CM | POA: Diagnosis not present

## 2022-05-14 DIAGNOSIS — N186 End stage renal disease: Secondary | ICD-10-CM | POA: Diagnosis not present

## 2022-05-14 DIAGNOSIS — Z992 Dependence on renal dialysis: Secondary | ICD-10-CM | POA: Diagnosis not present

## 2022-05-14 DIAGNOSIS — D631 Anemia in chronic kidney disease: Secondary | ICD-10-CM | POA: Diagnosis not present

## 2022-05-15 DIAGNOSIS — Z992 Dependence on renal dialysis: Secondary | ICD-10-CM | POA: Diagnosis not present

## 2022-05-15 DIAGNOSIS — N186 End stage renal disease: Secondary | ICD-10-CM | POA: Diagnosis not present

## 2022-05-15 DIAGNOSIS — I129 Hypertensive chronic kidney disease with stage 1 through stage 4 chronic kidney disease, or unspecified chronic kidney disease: Secondary | ICD-10-CM | POA: Diagnosis not present

## 2022-05-16 NOTE — Progress Notes (Deleted)
Cardiology Office Note:    Date:  05/16/2022   ID:  [name], DOB 1965-11-28, MRN [id number]  PCP:  Horald Pollen, MD  Cardiologist:  Pixie Casino, MD  Electrophysiologist:  None   Referring MD: Horald Pollen, *   Chief Complaint: routine follow-up of CHF and hypertension  History of Present Illness:    [name] is a 57 y.o. male with a history of chronic HFrEF with EF of 35-40% in 2021 with normalization of EF to 55% on recent stress Echo in 01/2022 at [facility], mitral regurgitation, hypertension, hyperlipidemia, ESRD on hemodialysis, obstructive sleep apnea (declines CPAP), PUD, and non-compliance who is followed by Dr. Debara Pickett and presents today for routine follow-up.   Patient previously followed by Dr. [name] and Dr. Debara Pickett for his hypertriglyceridemia. Now followed by Dr. Debara Pickett alone.  Per chart Dr. Francesca Oman office visit note in 05/2019, "his management has been very complicated by patient's refusal to comply with medications and inconsistency with multiple providers involved in his care." Prior Myoview in 04/2013 was negative. He was admitted in 03/2019 for new onset CHF. Echo showed LVEF of 35-40% with global hypokinesis and grade 2 diastolic dysfunction, normal RV size and function, moderate biatrial enlargement, mild to moderate MR, moderately elevated PASP, and mild dilatation of the aortic root measuring 40 mm. EF was down from 60-65% in 2020. Volume status was managed via dialysis. Etiology of cardiomyopathy was unclear but felt to be possibly due to hypertension. Recommendation was for repeat Echo in 3 months after optimization of GDMT with consideration of ischemic evaluation if no improvement in EF. However, it does not look like he ever got a repeat Echo.   Patient was last seen by Dr. Debara Pickett in 04/2020 for follow-up of his hypertriglyceridemia but had not had any recent labs. He was continued on his Vascepa.   Since then it looks like he had a stress Echo in 01/2022  at [facility] as part of work-up for renal transplant. Stress Echo was normal with LVEF of 55% and no regional wall motion abnormalities at rest or with stress.   Patient presents today for follow-up ***  Chronic HFrEF Initially diagnosed during an admission in 03/2019. Echo at that time showed LVEF of 35-40% with global hypokinesis. Etiology of cardiomyopathy was unclear but felt to be possibly due to hypertension. EF has since normalized. Recent stress Echo in 01/2022 at [facility] as part of work-up for potential renal transplants showed LVEF of 55% with no regional wall motion abnormalities.  - Euvolemic on exam.  - Volume status managed via dialysis. - Continue Croeg 25mg  twice dialy.  - Continue Hydralazine 25mg  three times daily.  - No ACEi/ARB/ARNI, MRA, or SGLT2 inhibitor due to renal function.  Mild to Moderate Mitral Regurgitation Noted on Echo in 03/2019. He recently had a stress Echo in 01/2022 at [facility] as work-up for possible renal transplant but valves were no evaluated.  - Will repeat Echo. ***  Hypertension BP well controlled. *** - Continue current medications: Amlodipine 5mg  daily, Coreg 25mg  twice daily, and Hydralazine 25mg  three times daily.   Hyperlipidemia Most recent lipid panel ***: - Continue Vascepa 2g twice daily. - Has not been on statin given ESRD but could consider adding Lipitor ***.   ESRD On hemodialysis *** - Managed by Nephrology.  Past Medical History:  Diagnosis Date   Anemia    Back pain    CKD (chronic kidney disease), stage III ([place])    Stage 4  Colon polyps    adenomatous   Elevated cholesterol    History of echocardiogram    Echo 11/17: EF 65-70, normal wall motion, grade 1 diastolic dysfunction, trivial MR, mild LAE, mild TR   Hyperlipidemia    Hypertension    no current bp meds for last 3 months   Kidney stones 2007   Medication intolerance    a. multiple with prior nonadherence to regimen.   Nausea    " in the morning since having the  kidney problem "   Otosclerosis of both ears    Pneumonia    PONV (postoperative nausea and vomiting)    PUD (peptic ulcer disease)    Has had unspecified surgery for this   Sleep apnea     Past Surgical History:  Procedure Laterality Date   AV FISTULA PLACEMENT Left 01/04/2019   Procedure: ARTERIOVENOUS (AV) FISTULA CREATION LEFT ARM;  Surgeon: [name], MD;  Location: [place];  Service: Vascular;  Laterality: Left;   [place] Left 03/11/2019   Procedure: BASILIC VEIN TRANSPOSITION 2ND STAGE LEFT;  Surgeon: [name], MD;  Location: [place] [facility];  Service: Vascular;  Laterality: Left;   NEPHRECTOMY  02/18/2011   Procedure: NEPHRECTOMY;  Surgeon: Hanley Ben, MD;  Location: [facility];  Service: Urology;  Laterality: Right;   SMALL INTESTINE SURGERY     STAPEDOTOMY  2005   lt ear jan, right ear sept   surgery for ulcers  1990    Current Medications: No outpatient medications have been marked as taking for the 05/19/22 encounter (Appointment) with Darreld Mclean, PA-C.     Allergies:   Lokelma [sodium zirconium cyclosilicate], Beef-derived products, Pegademase bovine, Poractant alfa, and Pork-derived products   Social History   Socioeconomic History   Marital status: Married    Spouse name: Salya   Number of children: 5   Years of education: Not on file   Highest education level: 12th grade  Occupational History    Employer: [employer]  Tobacco Use   Smoking status: Never   Smokeless tobacco: Never  Vaping Use   Vaping Use: Never used  Substance and Sexual Activity   Alcohol use: No   Drug use: No   Sexual activity: Yes    Birth control/protection: None  Other Topics Concern   Not on file  Social History Narrative   Lives at home with wife and family. He is from [place]. Came to the [place] in 2002.      Patient is right-handed. He lives in a one level home. He drinks 1-2 cups of coffee and tea a day. He does not exercise.   Social  Determinants of Health   Financial Resource Strain: Low Risk  (08/25/2018)   Overall Financial Resource Strain (CARDIA)    Difficulty of Paying Living Expenses: Not hard at all  Food Insecurity: No Food Insecurity (08/25/2018)   Hunger Vital Sign    Worried About Running Out of Food in the Last Year: Never true    Ran Out of Food in the Last Year: Never true  Transportation Needs: No Transportation Needs (08/25/2018)   PRAPARE - Hydrologist (Medical): No    Lack of Transportation (Non-Medical): No  Physical Activity: Inactive (08/25/2018)   Exercise Vital Sign    Days of Exercise per Week: 0 days    Minutes of Exercise per Session: 0 min  Stress: No Stress Concern Present (08/25/2018)   [employer] of  Occupational Health - Occupational Stress Questionnaire    Feeling of Stress : Not at all  Social Connections: Moderately Integrated (08/25/2018)   Social Connection and Isolation Panel [NHANES]    Frequency of Communication with Friends and Family: More than three times a week    Frequency of Social Gatherings with Friends and Family: More than three times a week    Attends Religious Services: More than 4 times per year    Active Member of [employer] or Organizations: No    Attends Archivist Meetings: Never    Marital Status: Married     Family History: The patient's family history includes Esophageal cancer in his cousin; Healthy in his child; Heart disease in his mother; Heart disease (age of onset: 73) in his brother; Hyperlipidemia in his father, sister, and sister; Hypertension in his mother and sister. There is no history of Colon cancer, Stomach cancer, or Rectal cancer.  ROS:   Please see the history of present illness.     EKGs/Labs/Other Studies Reviewed:    The following studies were reviewed:  Echocardiogram 04/12/2019: Impressions: 1. Low normal GLS -14.9 EF has decreased since echo done July 2020 . Left  ventricular ejection  fraction, by estimation, is 35 to 40%. The left  ventricle has moderately decreased function. The left ventricle  demonstrates global hypokinesis. The left  ventricular internal cavity size was moderately dilated. Left ventricular  diastolic parameters are consistent with Grade II diastolic dysfunction  (pseudonormalization). Elevated left ventricular end-diastolic pressure.   2. Right ventricular systolic function is normal. The right ventricular  size is normal. There is moderately elevated pulmonary artery systolic  pressure.   3. Left atrial size was moderately dilated.   4. Right atrial size was moderately dilated.   5. The mitral valve is normal in structure and function. Mild to moderate  mitral valve regurgitation. No evidence of mitral stenosis.   6. The aortic valve is normal in structure and function. Aortic valve  regurgitation is not visualized. No aortic stenosis is present.   7. Aortic dilatation noted. There is mild dilatation of the aortic root  measuring 40 mm.   8. The inferior vena cava is normal in size with greater than 50%  respiratory variability, suggesting right atrial pressure of 3 mmHg.  _______________  Stress Echocardiogram 01/25/2022 ([facility]): Interpretation: NORMAL STRESS TEST. NORMAL RESTING STUDY WITH NO WALL MOTION ABNORMALITIES  AT REST AND PEAK STRESS.  NO DOPPLER PERFORMED FOR VALVULAR REGURGITATION  NO DOPPLER PERFORMED FOR VALVULAR STENOSIS  Maximum workload of  7.00 METs was achieved during exercise.  RESTING HYPERTENSION - EXAGGERATED RESPONSE    EKG:  EKG ordered today. EKG personally reviewed and demonstrates ***.  Recent Labs: 11/07/2021: ALT 18 11/08/2021: Hemoglobin 9.2; Platelets 168 11/09/2021: BUN 46; Creatinine, Ser 9.45; Potassium 4.6; Sodium 132  Recent Lipid Panel    Component Value Date/Time   CHOL 330 (H) 03/23/2021 1340   CHOL 365 (H) 05/12/2020 1504   TRIG (H) 03/23/2021 1340    1167.0 Triglyceride is over 400;  calculations on Lipids are invalid.   HDL 27.40 (L) 03/23/2021 1340   HDL 26 (L) 05/12/2020 1504   CHOLHDL 12 03/23/2021 1340   VLDL 61 (H) 04/13/2019 0450   LDLCALC Comment (A) 05/12/2020 1504   LDLDIRECT 53.0 03/23/2021 1340    Physical Exam:    Vital Signs: There were no vitals taken for this visit.    Wt Readings from Last 3 Encounters:  12/29/21 243 lb 6  oz (110.4 kg)  11/09/21 236 lb 15.9 oz (107.5 kg)  03/23/21 245 lb 8 oz (111.4 kg)     General: 57 y.o. male in no acute distress. HEENT: Normocephalic and atraumatic. Sclera clear. EOMs intact. Neck: Supple. No carotid bruits. No JVD. Heart: *** RRR. Distinct S1 and S2. No murmurs, gallops, or rubs. Radial and distal pedal pulses 2+ and equal bilaterally. Lungs: No increased work of breathing. Clear to ausculation bilaterally. No wheezes, rhonchi, or rales.  Abdomen: Soft, non-distended, and non-tender to palpation. Bowel sounds present in all 4 quadrants.  MSK: Normal strength and tone for age. *** Extremities: No lower extremity edema.    Skin: Warm and dry. Neuro: Alert and oriented x3. No focal deficits. Psych: Normal affect. Responds appropriately.   Assessment:    No diagnosis found.  Plan:     Disposition: Follow up in ***   Medication Adjustments/Labs and Tests Ordered: Current medicines are reviewed at length with the patient today.  Concerns regarding medicines are outlined above.  No orders of the defined types were placed in this encounter.  No orders of the defined types were placed in this encounter.   There are no Patient Instructions on file for this visit.   Signed, Darreld Mclean, PA-C  05/16/2022 10:42 AM    [place]

## 2022-05-17 DIAGNOSIS — R7879 Finding of abnormal level of heavy metals in blood: Secondary | ICD-10-CM | POA: Diagnosis not present

## 2022-05-17 DIAGNOSIS — N2581 Secondary hyperparathyroidism of renal origin: Secondary | ICD-10-CM | POA: Diagnosis not present

## 2022-05-17 DIAGNOSIS — D631 Anemia in chronic kidney disease: Secondary | ICD-10-CM | POA: Diagnosis not present

## 2022-05-17 DIAGNOSIS — Z992 Dependence on renal dialysis: Secondary | ICD-10-CM | POA: Diagnosis not present

## 2022-05-17 DIAGNOSIS — D509 Iron deficiency anemia, unspecified: Secondary | ICD-10-CM | POA: Diagnosis not present

## 2022-05-17 DIAGNOSIS — N186 End stage renal disease: Secondary | ICD-10-CM | POA: Diagnosis not present

## 2022-05-19 ENCOUNTER — Ambulatory Visit: Payer: Medicare HMO | Admitting: Student

## 2022-05-19 DIAGNOSIS — D509 Iron deficiency anemia, unspecified: Secondary | ICD-10-CM | POA: Diagnosis not present

## 2022-05-19 DIAGNOSIS — N2581 Secondary hyperparathyroidism of renal origin: Secondary | ICD-10-CM | POA: Diagnosis not present

## 2022-05-19 DIAGNOSIS — Z992 Dependence on renal dialysis: Secondary | ICD-10-CM | POA: Diagnosis not present

## 2022-05-19 DIAGNOSIS — N186 End stage renal disease: Secondary | ICD-10-CM | POA: Diagnosis not present

## 2022-05-19 DIAGNOSIS — R7879 Finding of abnormal level of heavy metals in blood: Secondary | ICD-10-CM | POA: Diagnosis not present

## 2022-05-19 DIAGNOSIS — D631 Anemia in chronic kidney disease: Secondary | ICD-10-CM | POA: Diagnosis not present

## 2022-05-21 DIAGNOSIS — N2581 Secondary hyperparathyroidism of renal origin: Secondary | ICD-10-CM | POA: Diagnosis not present

## 2022-05-21 DIAGNOSIS — D631 Anemia in chronic kidney disease: Secondary | ICD-10-CM | POA: Diagnosis not present

## 2022-05-21 DIAGNOSIS — Z992 Dependence on renal dialysis: Secondary | ICD-10-CM | POA: Diagnosis not present

## 2022-05-21 DIAGNOSIS — D509 Iron deficiency anemia, unspecified: Secondary | ICD-10-CM | POA: Diagnosis not present

## 2022-05-21 DIAGNOSIS — R7879 Finding of abnormal level of heavy metals in blood: Secondary | ICD-10-CM | POA: Diagnosis not present

## 2022-05-21 DIAGNOSIS — N186 End stage renal disease: Secondary | ICD-10-CM | POA: Diagnosis not present

## 2022-05-24 DIAGNOSIS — Z992 Dependence on renal dialysis: Secondary | ICD-10-CM | POA: Diagnosis not present

## 2022-05-24 DIAGNOSIS — D509 Iron deficiency anemia, unspecified: Secondary | ICD-10-CM | POA: Diagnosis not present

## 2022-05-24 DIAGNOSIS — R7879 Finding of abnormal level of heavy metals in blood: Secondary | ICD-10-CM | POA: Diagnosis not present

## 2022-05-24 DIAGNOSIS — D631 Anemia in chronic kidney disease: Secondary | ICD-10-CM | POA: Diagnosis not present

## 2022-05-24 DIAGNOSIS — N186 End stage renal disease: Secondary | ICD-10-CM | POA: Diagnosis not present

## 2022-05-24 DIAGNOSIS — N2581 Secondary hyperparathyroidism of renal origin: Secondary | ICD-10-CM | POA: Diagnosis not present

## 2022-05-28 DIAGNOSIS — N186 End stage renal disease: Secondary | ICD-10-CM | POA: Diagnosis not present

## 2022-05-28 DIAGNOSIS — D631 Anemia in chronic kidney disease: Secondary | ICD-10-CM | POA: Diagnosis not present

## 2022-05-28 DIAGNOSIS — D509 Iron deficiency anemia, unspecified: Secondary | ICD-10-CM | POA: Diagnosis not present

## 2022-05-28 DIAGNOSIS — Z992 Dependence on renal dialysis: Secondary | ICD-10-CM | POA: Diagnosis not present

## 2022-05-28 DIAGNOSIS — N2581 Secondary hyperparathyroidism of renal origin: Secondary | ICD-10-CM | POA: Diagnosis not present

## 2022-05-28 DIAGNOSIS — R7879 Finding of abnormal level of heavy metals in blood: Secondary | ICD-10-CM | POA: Diagnosis not present

## 2022-05-31 DIAGNOSIS — D509 Iron deficiency anemia, unspecified: Secondary | ICD-10-CM | POA: Diagnosis not present

## 2022-05-31 DIAGNOSIS — Z992 Dependence on renal dialysis: Secondary | ICD-10-CM | POA: Diagnosis not present

## 2022-05-31 DIAGNOSIS — R7879 Finding of abnormal level of heavy metals in blood: Secondary | ICD-10-CM | POA: Diagnosis not present

## 2022-05-31 DIAGNOSIS — N186 End stage renal disease: Secondary | ICD-10-CM | POA: Diagnosis not present

## 2022-05-31 DIAGNOSIS — D631 Anemia in chronic kidney disease: Secondary | ICD-10-CM | POA: Diagnosis not present

## 2022-05-31 DIAGNOSIS — N2581 Secondary hyperparathyroidism of renal origin: Secondary | ICD-10-CM | POA: Diagnosis not present

## 2022-06-02 DIAGNOSIS — R7879 Finding of abnormal level of heavy metals in blood: Secondary | ICD-10-CM | POA: Diagnosis not present

## 2022-06-02 DIAGNOSIS — N186 End stage renal disease: Secondary | ICD-10-CM | POA: Diagnosis not present

## 2022-06-02 DIAGNOSIS — D509 Iron deficiency anemia, unspecified: Secondary | ICD-10-CM | POA: Diagnosis not present

## 2022-06-02 DIAGNOSIS — Z992 Dependence on renal dialysis: Secondary | ICD-10-CM | POA: Diagnosis not present

## 2022-06-02 DIAGNOSIS — D631 Anemia in chronic kidney disease: Secondary | ICD-10-CM | POA: Diagnosis not present

## 2022-06-02 DIAGNOSIS — N2581 Secondary hyperparathyroidism of renal origin: Secondary | ICD-10-CM | POA: Diagnosis not present

## 2022-06-04 DIAGNOSIS — D509 Iron deficiency anemia, unspecified: Secondary | ICD-10-CM | POA: Diagnosis not present

## 2022-06-04 DIAGNOSIS — N186 End stage renal disease: Secondary | ICD-10-CM | POA: Diagnosis not present

## 2022-06-04 DIAGNOSIS — N2581 Secondary hyperparathyroidism of renal origin: Secondary | ICD-10-CM | POA: Diagnosis not present

## 2022-06-04 DIAGNOSIS — Z992 Dependence on renal dialysis: Secondary | ICD-10-CM | POA: Diagnosis not present

## 2022-06-04 DIAGNOSIS — D631 Anemia in chronic kidney disease: Secondary | ICD-10-CM | POA: Diagnosis not present

## 2022-06-04 DIAGNOSIS — R7879 Finding of abnormal level of heavy metals in blood: Secondary | ICD-10-CM | POA: Diagnosis not present

## 2022-06-07 DIAGNOSIS — N2581 Secondary hyperparathyroidism of renal origin: Secondary | ICD-10-CM | POA: Diagnosis not present

## 2022-06-07 DIAGNOSIS — D509 Iron deficiency anemia, unspecified: Secondary | ICD-10-CM | POA: Diagnosis not present

## 2022-06-07 DIAGNOSIS — N186 End stage renal disease: Secondary | ICD-10-CM | POA: Diagnosis not present

## 2022-06-07 DIAGNOSIS — R7879 Finding of abnormal level of heavy metals in blood: Secondary | ICD-10-CM | POA: Diagnosis not present

## 2022-06-07 DIAGNOSIS — D631 Anemia in chronic kidney disease: Secondary | ICD-10-CM | POA: Diagnosis not present

## 2022-06-07 DIAGNOSIS — Z992 Dependence on renal dialysis: Secondary | ICD-10-CM | POA: Diagnosis not present

## 2022-06-09 DIAGNOSIS — N186 End stage renal disease: Secondary | ICD-10-CM | POA: Diagnosis not present

## 2022-06-09 DIAGNOSIS — D509 Iron deficiency anemia, unspecified: Secondary | ICD-10-CM | POA: Diagnosis not present

## 2022-06-09 DIAGNOSIS — R7879 Finding of abnormal level of heavy metals in blood: Secondary | ICD-10-CM | POA: Diagnosis not present

## 2022-06-09 DIAGNOSIS — D631 Anemia in chronic kidney disease: Secondary | ICD-10-CM | POA: Diagnosis not present

## 2022-06-09 DIAGNOSIS — N2581 Secondary hyperparathyroidism of renal origin: Secondary | ICD-10-CM | POA: Diagnosis not present

## 2022-06-09 DIAGNOSIS — Z992 Dependence on renal dialysis: Secondary | ICD-10-CM | POA: Diagnosis not present

## 2022-06-11 DIAGNOSIS — R7879 Finding of abnormal level of heavy metals in blood: Secondary | ICD-10-CM | POA: Diagnosis not present

## 2022-06-11 DIAGNOSIS — D509 Iron deficiency anemia, unspecified: Secondary | ICD-10-CM | POA: Diagnosis not present

## 2022-06-11 DIAGNOSIS — D631 Anemia in chronic kidney disease: Secondary | ICD-10-CM | POA: Diagnosis not present

## 2022-06-11 DIAGNOSIS — N2581 Secondary hyperparathyroidism of renal origin: Secondary | ICD-10-CM | POA: Diagnosis not present

## 2022-06-11 DIAGNOSIS — Z992 Dependence on renal dialysis: Secondary | ICD-10-CM | POA: Diagnosis not present

## 2022-06-11 DIAGNOSIS — N186 End stage renal disease: Secondary | ICD-10-CM | POA: Diagnosis not present

## 2022-06-14 DIAGNOSIS — Z7682 Awaiting organ transplant status: Secondary | ICD-10-CM | POA: Diagnosis not present

## 2022-06-14 DIAGNOSIS — N2581 Secondary hyperparathyroidism of renal origin: Secondary | ICD-10-CM | POA: Diagnosis not present

## 2022-06-14 DIAGNOSIS — R7879 Finding of abnormal level of heavy metals in blood: Secondary | ICD-10-CM | POA: Diagnosis not present

## 2022-06-14 DIAGNOSIS — N186 End stage renal disease: Secondary | ICD-10-CM | POA: Diagnosis not present

## 2022-06-14 DIAGNOSIS — D631 Anemia in chronic kidney disease: Secondary | ICD-10-CM | POA: Diagnosis not present

## 2022-06-14 DIAGNOSIS — Z992 Dependence on renal dialysis: Secondary | ICD-10-CM | POA: Diagnosis not present

## 2022-06-14 DIAGNOSIS — D509 Iron deficiency anemia, unspecified: Secondary | ICD-10-CM | POA: Diagnosis not present

## 2022-06-14 DIAGNOSIS — I129 Hypertensive chronic kidney disease with stage 1 through stage 4 chronic kidney disease, or unspecified chronic kidney disease: Secondary | ICD-10-CM | POA: Diagnosis not present

## 2022-06-15 DIAGNOSIS — L83 Acanthosis nigricans: Secondary | ICD-10-CM | POA: Diagnosis not present

## 2022-06-16 DIAGNOSIS — N186 End stage renal disease: Secondary | ICD-10-CM | POA: Diagnosis not present

## 2022-06-16 DIAGNOSIS — D509 Iron deficiency anemia, unspecified: Secondary | ICD-10-CM | POA: Diagnosis not present

## 2022-06-16 DIAGNOSIS — D631 Anemia in chronic kidney disease: Secondary | ICD-10-CM | POA: Diagnosis not present

## 2022-06-16 DIAGNOSIS — Z992 Dependence on renal dialysis: Secondary | ICD-10-CM | POA: Diagnosis not present

## 2022-06-16 DIAGNOSIS — N2581 Secondary hyperparathyroidism of renal origin: Secondary | ICD-10-CM | POA: Diagnosis not present

## 2022-06-18 DIAGNOSIS — N2581 Secondary hyperparathyroidism of renal origin: Secondary | ICD-10-CM | POA: Diagnosis not present

## 2022-06-18 DIAGNOSIS — D509 Iron deficiency anemia, unspecified: Secondary | ICD-10-CM | POA: Diagnosis not present

## 2022-06-18 DIAGNOSIS — Z992 Dependence on renal dialysis: Secondary | ICD-10-CM | POA: Diagnosis not present

## 2022-06-18 DIAGNOSIS — D631 Anemia in chronic kidney disease: Secondary | ICD-10-CM | POA: Diagnosis not present

## 2022-06-18 DIAGNOSIS — N186 End stage renal disease: Secondary | ICD-10-CM | POA: Diagnosis not present

## 2022-06-21 DIAGNOSIS — N186 End stage renal disease: Secondary | ICD-10-CM | POA: Diagnosis not present

## 2022-06-21 DIAGNOSIS — Z992 Dependence on renal dialysis: Secondary | ICD-10-CM | POA: Diagnosis not present

## 2022-06-21 DIAGNOSIS — D631 Anemia in chronic kidney disease: Secondary | ICD-10-CM | POA: Diagnosis not present

## 2022-06-21 DIAGNOSIS — D509 Iron deficiency anemia, unspecified: Secondary | ICD-10-CM | POA: Diagnosis not present

## 2022-06-21 DIAGNOSIS — N2581 Secondary hyperparathyroidism of renal origin: Secondary | ICD-10-CM | POA: Diagnosis not present

## 2022-06-23 DIAGNOSIS — D509 Iron deficiency anemia, unspecified: Secondary | ICD-10-CM | POA: Diagnosis not present

## 2022-06-23 DIAGNOSIS — D631 Anemia in chronic kidney disease: Secondary | ICD-10-CM | POA: Diagnosis not present

## 2022-06-23 DIAGNOSIS — Z992 Dependence on renal dialysis: Secondary | ICD-10-CM | POA: Diagnosis not present

## 2022-06-23 DIAGNOSIS — N2581 Secondary hyperparathyroidism of renal origin: Secondary | ICD-10-CM | POA: Diagnosis not present

## 2022-06-23 DIAGNOSIS — N186 End stage renal disease: Secondary | ICD-10-CM | POA: Diagnosis not present

## 2022-06-25 DIAGNOSIS — Z992 Dependence on renal dialysis: Secondary | ICD-10-CM | POA: Diagnosis not present

## 2022-06-25 DIAGNOSIS — D509 Iron deficiency anemia, unspecified: Secondary | ICD-10-CM | POA: Diagnosis not present

## 2022-06-25 DIAGNOSIS — D631 Anemia in chronic kidney disease: Secondary | ICD-10-CM | POA: Diagnosis not present

## 2022-06-25 DIAGNOSIS — N186 End stage renal disease: Secondary | ICD-10-CM | POA: Diagnosis not present

## 2022-06-25 DIAGNOSIS — N2581 Secondary hyperparathyroidism of renal origin: Secondary | ICD-10-CM | POA: Diagnosis not present

## 2022-06-28 DIAGNOSIS — N2581 Secondary hyperparathyroidism of renal origin: Secondary | ICD-10-CM | POA: Diagnosis not present

## 2022-06-28 DIAGNOSIS — D631 Anemia in chronic kidney disease: Secondary | ICD-10-CM | POA: Diagnosis not present

## 2022-06-28 DIAGNOSIS — D509 Iron deficiency anemia, unspecified: Secondary | ICD-10-CM | POA: Diagnosis not present

## 2022-06-28 DIAGNOSIS — N186 End stage renal disease: Secondary | ICD-10-CM | POA: Diagnosis not present

## 2022-06-28 DIAGNOSIS — Z992 Dependence on renal dialysis: Secondary | ICD-10-CM | POA: Diagnosis not present

## 2022-06-30 DIAGNOSIS — N2581 Secondary hyperparathyroidism of renal origin: Secondary | ICD-10-CM | POA: Diagnosis not present

## 2022-06-30 DIAGNOSIS — D631 Anemia in chronic kidney disease: Secondary | ICD-10-CM | POA: Diagnosis not present

## 2022-06-30 DIAGNOSIS — N186 End stage renal disease: Secondary | ICD-10-CM | POA: Diagnosis not present

## 2022-06-30 DIAGNOSIS — Z992 Dependence on renal dialysis: Secondary | ICD-10-CM | POA: Diagnosis not present

## 2022-06-30 DIAGNOSIS — D509 Iron deficiency anemia, unspecified: Secondary | ICD-10-CM | POA: Diagnosis not present

## 2022-07-02 DIAGNOSIS — D631 Anemia in chronic kidney disease: Secondary | ICD-10-CM | POA: Diagnosis not present

## 2022-07-02 DIAGNOSIS — N2581 Secondary hyperparathyroidism of renal origin: Secondary | ICD-10-CM | POA: Diagnosis not present

## 2022-07-02 DIAGNOSIS — Z992 Dependence on renal dialysis: Secondary | ICD-10-CM | POA: Diagnosis not present

## 2022-07-02 DIAGNOSIS — D509 Iron deficiency anemia, unspecified: Secondary | ICD-10-CM | POA: Diagnosis not present

## 2022-07-02 DIAGNOSIS — N186 End stage renal disease: Secondary | ICD-10-CM | POA: Diagnosis not present

## 2022-07-05 DIAGNOSIS — D631 Anemia in chronic kidney disease: Secondary | ICD-10-CM | POA: Diagnosis not present

## 2022-07-05 DIAGNOSIS — N186 End stage renal disease: Secondary | ICD-10-CM | POA: Diagnosis not present

## 2022-07-05 DIAGNOSIS — N2581 Secondary hyperparathyroidism of renal origin: Secondary | ICD-10-CM | POA: Diagnosis not present

## 2022-07-05 DIAGNOSIS — D509 Iron deficiency anemia, unspecified: Secondary | ICD-10-CM | POA: Diagnosis not present

## 2022-07-05 DIAGNOSIS — Z992 Dependence on renal dialysis: Secondary | ICD-10-CM | POA: Diagnosis not present

## 2022-07-07 DIAGNOSIS — N2581 Secondary hyperparathyroidism of renal origin: Secondary | ICD-10-CM | POA: Diagnosis not present

## 2022-07-07 DIAGNOSIS — N186 End stage renal disease: Secondary | ICD-10-CM | POA: Diagnosis not present

## 2022-07-07 DIAGNOSIS — D631 Anemia in chronic kidney disease: Secondary | ICD-10-CM | POA: Diagnosis not present

## 2022-07-07 DIAGNOSIS — Z992 Dependence on renal dialysis: Secondary | ICD-10-CM | POA: Diagnosis not present

## 2022-07-07 DIAGNOSIS — D509 Iron deficiency anemia, unspecified: Secondary | ICD-10-CM | POA: Diagnosis not present

## 2022-07-09 DIAGNOSIS — N2581 Secondary hyperparathyroidism of renal origin: Secondary | ICD-10-CM | POA: Diagnosis not present

## 2022-07-09 DIAGNOSIS — N186 End stage renal disease: Secondary | ICD-10-CM | POA: Diagnosis not present

## 2022-07-09 DIAGNOSIS — D631 Anemia in chronic kidney disease: Secondary | ICD-10-CM | POA: Diagnosis not present

## 2022-07-09 DIAGNOSIS — Z992 Dependence on renal dialysis: Secondary | ICD-10-CM | POA: Diagnosis not present

## 2022-07-09 DIAGNOSIS — D509 Iron deficiency anemia, unspecified: Secondary | ICD-10-CM | POA: Diagnosis not present

## 2022-07-12 DIAGNOSIS — Z992 Dependence on renal dialysis: Secondary | ICD-10-CM | POA: Diagnosis not present

## 2022-07-12 DIAGNOSIS — N186 End stage renal disease: Secondary | ICD-10-CM | POA: Diagnosis not present

## 2022-07-12 DIAGNOSIS — D509 Iron deficiency anemia, unspecified: Secondary | ICD-10-CM | POA: Diagnosis not present

## 2022-07-12 DIAGNOSIS — N2581 Secondary hyperparathyroidism of renal origin: Secondary | ICD-10-CM | POA: Diagnosis not present

## 2022-07-12 DIAGNOSIS — D631 Anemia in chronic kidney disease: Secondary | ICD-10-CM | POA: Diagnosis not present

## 2022-07-14 DIAGNOSIS — D509 Iron deficiency anemia, unspecified: Secondary | ICD-10-CM | POA: Diagnosis not present

## 2022-07-14 DIAGNOSIS — N186 End stage renal disease: Secondary | ICD-10-CM | POA: Diagnosis not present

## 2022-07-14 DIAGNOSIS — Z992 Dependence on renal dialysis: Secondary | ICD-10-CM | POA: Diagnosis not present

## 2022-07-14 DIAGNOSIS — N2581 Secondary hyperparathyroidism of renal origin: Secondary | ICD-10-CM | POA: Diagnosis not present

## 2022-07-14 DIAGNOSIS — D631 Anemia in chronic kidney disease: Secondary | ICD-10-CM | POA: Diagnosis not present

## 2022-07-15 DIAGNOSIS — I129 Hypertensive chronic kidney disease with stage 1 through stage 4 chronic kidney disease, or unspecified chronic kidney disease: Secondary | ICD-10-CM | POA: Diagnosis not present

## 2022-07-15 DIAGNOSIS — Z992 Dependence on renal dialysis: Secondary | ICD-10-CM | POA: Diagnosis not present

## 2022-07-15 DIAGNOSIS — N186 End stage renal disease: Secondary | ICD-10-CM | POA: Diagnosis not present

## 2022-07-16 DIAGNOSIS — N186 End stage renal disease: Secondary | ICD-10-CM | POA: Diagnosis not present

## 2022-07-16 DIAGNOSIS — D631 Anemia in chronic kidney disease: Secondary | ICD-10-CM | POA: Diagnosis not present

## 2022-07-16 DIAGNOSIS — Z992 Dependence on renal dialysis: Secondary | ICD-10-CM | POA: Diagnosis not present

## 2022-07-16 DIAGNOSIS — Z111 Encounter for screening for respiratory tuberculosis: Secondary | ICD-10-CM | POA: Diagnosis not present

## 2022-07-16 DIAGNOSIS — N2581 Secondary hyperparathyroidism of renal origin: Secondary | ICD-10-CM | POA: Diagnosis not present

## 2022-07-16 DIAGNOSIS — D509 Iron deficiency anemia, unspecified: Secondary | ICD-10-CM | POA: Diagnosis not present

## 2022-07-19 DIAGNOSIS — D509 Iron deficiency anemia, unspecified: Secondary | ICD-10-CM | POA: Diagnosis not present

## 2022-07-19 DIAGNOSIS — Z992 Dependence on renal dialysis: Secondary | ICD-10-CM | POA: Diagnosis not present

## 2022-07-19 DIAGNOSIS — N186 End stage renal disease: Secondary | ICD-10-CM | POA: Diagnosis not present

## 2022-07-19 DIAGNOSIS — D631 Anemia in chronic kidney disease: Secondary | ICD-10-CM | POA: Diagnosis not present

## 2022-07-19 DIAGNOSIS — N2581 Secondary hyperparathyroidism of renal origin: Secondary | ICD-10-CM | POA: Diagnosis not present

## 2022-07-19 DIAGNOSIS — Z111 Encounter for screening for respiratory tuberculosis: Secondary | ICD-10-CM | POA: Diagnosis not present

## 2022-07-21 DIAGNOSIS — Z992 Dependence on renal dialysis: Secondary | ICD-10-CM | POA: Diagnosis not present

## 2022-07-21 DIAGNOSIS — N2581 Secondary hyperparathyroidism of renal origin: Secondary | ICD-10-CM | POA: Diagnosis not present

## 2022-07-21 DIAGNOSIS — D631 Anemia in chronic kidney disease: Secondary | ICD-10-CM | POA: Diagnosis not present

## 2022-07-21 DIAGNOSIS — N186 End stage renal disease: Secondary | ICD-10-CM | POA: Diagnosis not present

## 2022-07-21 DIAGNOSIS — D509 Iron deficiency anemia, unspecified: Secondary | ICD-10-CM | POA: Diagnosis not present

## 2022-07-21 DIAGNOSIS — Z111 Encounter for screening for respiratory tuberculosis: Secondary | ICD-10-CM | POA: Diagnosis not present

## 2022-07-23 DIAGNOSIS — N186 End stage renal disease: Secondary | ICD-10-CM | POA: Diagnosis not present

## 2022-07-23 DIAGNOSIS — D631 Anemia in chronic kidney disease: Secondary | ICD-10-CM | POA: Diagnosis not present

## 2022-07-23 DIAGNOSIS — D509 Iron deficiency anemia, unspecified: Secondary | ICD-10-CM | POA: Diagnosis not present

## 2022-07-23 DIAGNOSIS — N2581 Secondary hyperparathyroidism of renal origin: Secondary | ICD-10-CM | POA: Diagnosis not present

## 2022-07-23 DIAGNOSIS — Z111 Encounter for screening for respiratory tuberculosis: Secondary | ICD-10-CM | POA: Diagnosis not present

## 2022-07-23 DIAGNOSIS — Z992 Dependence on renal dialysis: Secondary | ICD-10-CM | POA: Diagnosis not present

## 2022-07-26 DIAGNOSIS — Z111 Encounter for screening for respiratory tuberculosis: Secondary | ICD-10-CM | POA: Diagnosis not present

## 2022-07-26 DIAGNOSIS — N186 End stage renal disease: Secondary | ICD-10-CM | POA: Diagnosis not present

## 2022-07-26 DIAGNOSIS — D509 Iron deficiency anemia, unspecified: Secondary | ICD-10-CM | POA: Diagnosis not present

## 2022-07-26 DIAGNOSIS — D631 Anemia in chronic kidney disease: Secondary | ICD-10-CM | POA: Diagnosis not present

## 2022-07-26 DIAGNOSIS — N2581 Secondary hyperparathyroidism of renal origin: Secondary | ICD-10-CM | POA: Diagnosis not present

## 2022-07-26 DIAGNOSIS — Z992 Dependence on renal dialysis: Secondary | ICD-10-CM | POA: Diagnosis not present

## 2022-07-28 DIAGNOSIS — D631 Anemia in chronic kidney disease: Secondary | ICD-10-CM | POA: Diagnosis not present

## 2022-07-28 DIAGNOSIS — Z111 Encounter for screening for respiratory tuberculosis: Secondary | ICD-10-CM | POA: Diagnosis not present

## 2022-07-28 DIAGNOSIS — Z992 Dependence on renal dialysis: Secondary | ICD-10-CM | POA: Diagnosis not present

## 2022-07-28 DIAGNOSIS — D509 Iron deficiency anemia, unspecified: Secondary | ICD-10-CM | POA: Diagnosis not present

## 2022-07-28 DIAGNOSIS — N2581 Secondary hyperparathyroidism of renal origin: Secondary | ICD-10-CM | POA: Diagnosis not present

## 2022-07-28 DIAGNOSIS — N186 End stage renal disease: Secondary | ICD-10-CM | POA: Diagnosis not present

## 2022-07-30 DIAGNOSIS — N2581 Secondary hyperparathyroidism of renal origin: Secondary | ICD-10-CM | POA: Diagnosis not present

## 2022-07-30 DIAGNOSIS — D631 Anemia in chronic kidney disease: Secondary | ICD-10-CM | POA: Diagnosis not present

## 2022-07-30 DIAGNOSIS — D509 Iron deficiency anemia, unspecified: Secondary | ICD-10-CM | POA: Diagnosis not present

## 2022-07-30 DIAGNOSIS — Z111 Encounter for screening for respiratory tuberculosis: Secondary | ICD-10-CM | POA: Diagnosis not present

## 2022-07-30 DIAGNOSIS — Z992 Dependence on renal dialysis: Secondary | ICD-10-CM | POA: Diagnosis not present

## 2022-07-30 DIAGNOSIS — N186 End stage renal disease: Secondary | ICD-10-CM | POA: Diagnosis not present

## 2022-08-02 DIAGNOSIS — D509 Iron deficiency anemia, unspecified: Secondary | ICD-10-CM | POA: Diagnosis not present

## 2022-08-02 DIAGNOSIS — Z992 Dependence on renal dialysis: Secondary | ICD-10-CM | POA: Diagnosis not present

## 2022-08-02 DIAGNOSIS — N186 End stage renal disease: Secondary | ICD-10-CM | POA: Diagnosis not present

## 2022-08-02 DIAGNOSIS — Z111 Encounter for screening for respiratory tuberculosis: Secondary | ICD-10-CM | POA: Diagnosis not present

## 2022-08-02 DIAGNOSIS — D631 Anemia in chronic kidney disease: Secondary | ICD-10-CM | POA: Diagnosis not present

## 2022-08-02 DIAGNOSIS — N2581 Secondary hyperparathyroidism of renal origin: Secondary | ICD-10-CM | POA: Diagnosis not present

## 2022-08-04 DIAGNOSIS — Z992 Dependence on renal dialysis: Secondary | ICD-10-CM | POA: Diagnosis not present

## 2022-08-04 DIAGNOSIS — D509 Iron deficiency anemia, unspecified: Secondary | ICD-10-CM | POA: Diagnosis not present

## 2022-08-04 DIAGNOSIS — N186 End stage renal disease: Secondary | ICD-10-CM | POA: Diagnosis not present

## 2022-08-04 DIAGNOSIS — N2581 Secondary hyperparathyroidism of renal origin: Secondary | ICD-10-CM | POA: Diagnosis not present

## 2022-08-06 DIAGNOSIS — D509 Iron deficiency anemia, unspecified: Secondary | ICD-10-CM | POA: Diagnosis not present

## 2022-08-06 DIAGNOSIS — N186 End stage renal disease: Secondary | ICD-10-CM | POA: Diagnosis not present

## 2022-08-06 DIAGNOSIS — N2581 Secondary hyperparathyroidism of renal origin: Secondary | ICD-10-CM | POA: Diagnosis not present

## 2022-08-06 DIAGNOSIS — Z992 Dependence on renal dialysis: Secondary | ICD-10-CM | POA: Diagnosis not present

## 2022-08-09 DIAGNOSIS — D509 Iron deficiency anemia, unspecified: Secondary | ICD-10-CM | POA: Diagnosis not present

## 2022-08-09 DIAGNOSIS — N2581 Secondary hyperparathyroidism of renal origin: Secondary | ICD-10-CM | POA: Diagnosis not present

## 2022-08-09 DIAGNOSIS — N186 End stage renal disease: Secondary | ICD-10-CM | POA: Diagnosis not present

## 2022-08-09 DIAGNOSIS — Z992 Dependence on renal dialysis: Secondary | ICD-10-CM | POA: Diagnosis not present

## 2022-08-11 DIAGNOSIS — Z992 Dependence on renal dialysis: Secondary | ICD-10-CM | POA: Diagnosis not present

## 2022-08-11 DIAGNOSIS — N186 End stage renal disease: Secondary | ICD-10-CM | POA: Diagnosis not present

## 2022-08-11 DIAGNOSIS — D509 Iron deficiency anemia, unspecified: Secondary | ICD-10-CM | POA: Diagnosis not present

## 2022-08-11 DIAGNOSIS — N2581 Secondary hyperparathyroidism of renal origin: Secondary | ICD-10-CM | POA: Diagnosis not present

## 2022-08-13 DIAGNOSIS — N2581 Secondary hyperparathyroidism of renal origin: Secondary | ICD-10-CM | POA: Diagnosis not present

## 2022-08-13 DIAGNOSIS — N186 End stage renal disease: Secondary | ICD-10-CM | POA: Diagnosis not present

## 2022-08-13 DIAGNOSIS — Z992 Dependence on renal dialysis: Secondary | ICD-10-CM | POA: Diagnosis not present

## 2022-08-13 DIAGNOSIS — D509 Iron deficiency anemia, unspecified: Secondary | ICD-10-CM | POA: Diagnosis not present

## 2022-08-14 DIAGNOSIS — I129 Hypertensive chronic kidney disease with stage 1 through stage 4 chronic kidney disease, or unspecified chronic kidney disease: Secondary | ICD-10-CM | POA: Diagnosis not present

## 2022-08-14 DIAGNOSIS — Z992 Dependence on renal dialysis: Secondary | ICD-10-CM | POA: Diagnosis not present

## 2022-08-14 DIAGNOSIS — N186 End stage renal disease: Secondary | ICD-10-CM | POA: Diagnosis not present

## 2022-08-16 DIAGNOSIS — Z992 Dependence on renal dialysis: Secondary | ICD-10-CM | POA: Diagnosis not present

## 2022-08-16 DIAGNOSIS — D509 Iron deficiency anemia, unspecified: Secondary | ICD-10-CM | POA: Diagnosis not present

## 2022-08-16 DIAGNOSIS — D631 Anemia in chronic kidney disease: Secondary | ICD-10-CM | POA: Diagnosis not present

## 2022-08-16 DIAGNOSIS — N2581 Secondary hyperparathyroidism of renal origin: Secondary | ICD-10-CM | POA: Diagnosis not present

## 2022-08-16 DIAGNOSIS — N186 End stage renal disease: Secondary | ICD-10-CM | POA: Diagnosis not present

## 2022-08-16 DIAGNOSIS — Z7682 Awaiting organ transplant status: Secondary | ICD-10-CM | POA: Diagnosis not present

## 2022-08-17 DIAGNOSIS — Z23 Encounter for immunization: Secondary | ICD-10-CM | POA: Diagnosis not present

## 2022-08-17 DIAGNOSIS — Z992 Dependence on renal dialysis: Secondary | ICD-10-CM | POA: Diagnosis not present

## 2022-08-17 DIAGNOSIS — N186 End stage renal disease: Secondary | ICD-10-CM | POA: Diagnosis not present

## 2022-08-17 DIAGNOSIS — Z01818 Encounter for other preprocedural examination: Secondary | ICD-10-CM | POA: Diagnosis not present

## 2022-08-18 DIAGNOSIS — N186 End stage renal disease: Secondary | ICD-10-CM | POA: Diagnosis not present

## 2022-08-18 DIAGNOSIS — Z992 Dependence on renal dialysis: Secondary | ICD-10-CM | POA: Diagnosis not present

## 2022-08-18 DIAGNOSIS — D509 Iron deficiency anemia, unspecified: Secondary | ICD-10-CM | POA: Diagnosis not present

## 2022-08-18 DIAGNOSIS — N2581 Secondary hyperparathyroidism of renal origin: Secondary | ICD-10-CM | POA: Diagnosis not present

## 2022-08-18 DIAGNOSIS — D631 Anemia in chronic kidney disease: Secondary | ICD-10-CM | POA: Diagnosis not present

## 2022-08-20 DIAGNOSIS — D631 Anemia in chronic kidney disease: Secondary | ICD-10-CM | POA: Diagnosis not present

## 2022-08-20 DIAGNOSIS — D509 Iron deficiency anemia, unspecified: Secondary | ICD-10-CM | POA: Diagnosis not present

## 2022-08-20 DIAGNOSIS — Z992 Dependence on renal dialysis: Secondary | ICD-10-CM | POA: Diagnosis not present

## 2022-08-20 DIAGNOSIS — N2581 Secondary hyperparathyroidism of renal origin: Secondary | ICD-10-CM | POA: Diagnosis not present

## 2022-08-20 DIAGNOSIS — N186 End stage renal disease: Secondary | ICD-10-CM | POA: Diagnosis not present

## 2022-08-23 DIAGNOSIS — D509 Iron deficiency anemia, unspecified: Secondary | ICD-10-CM | POA: Diagnosis not present

## 2022-08-23 DIAGNOSIS — Z992 Dependence on renal dialysis: Secondary | ICD-10-CM | POA: Diagnosis not present

## 2022-08-23 DIAGNOSIS — N2581 Secondary hyperparathyroidism of renal origin: Secondary | ICD-10-CM | POA: Diagnosis not present

## 2022-08-23 DIAGNOSIS — N186 End stage renal disease: Secondary | ICD-10-CM | POA: Diagnosis not present

## 2022-08-23 DIAGNOSIS — D631 Anemia in chronic kidney disease: Secondary | ICD-10-CM | POA: Diagnosis not present

## 2022-08-25 DIAGNOSIS — Z992 Dependence on renal dialysis: Secondary | ICD-10-CM | POA: Diagnosis not present

## 2022-08-25 DIAGNOSIS — N186 End stage renal disease: Secondary | ICD-10-CM | POA: Diagnosis not present

## 2022-08-25 DIAGNOSIS — D509 Iron deficiency anemia, unspecified: Secondary | ICD-10-CM | POA: Diagnosis not present

## 2022-08-25 DIAGNOSIS — N2581 Secondary hyperparathyroidism of renal origin: Secondary | ICD-10-CM | POA: Diagnosis not present

## 2022-08-25 DIAGNOSIS — D631 Anemia in chronic kidney disease: Secondary | ICD-10-CM | POA: Diagnosis not present

## 2022-08-27 DIAGNOSIS — N2581 Secondary hyperparathyroidism of renal origin: Secondary | ICD-10-CM | POA: Diagnosis not present

## 2022-08-27 DIAGNOSIS — D509 Iron deficiency anemia, unspecified: Secondary | ICD-10-CM | POA: Diagnosis not present

## 2022-08-27 DIAGNOSIS — Z992 Dependence on renal dialysis: Secondary | ICD-10-CM | POA: Diagnosis not present

## 2022-08-27 DIAGNOSIS — D631 Anemia in chronic kidney disease: Secondary | ICD-10-CM | POA: Diagnosis not present

## 2022-08-27 DIAGNOSIS — N186 End stage renal disease: Secondary | ICD-10-CM | POA: Diagnosis not present

## 2022-08-30 DIAGNOSIS — N2581 Secondary hyperparathyroidism of renal origin: Secondary | ICD-10-CM | POA: Diagnosis not present

## 2022-08-30 DIAGNOSIS — N186 End stage renal disease: Secondary | ICD-10-CM | POA: Diagnosis not present

## 2022-08-30 DIAGNOSIS — D509 Iron deficiency anemia, unspecified: Secondary | ICD-10-CM | POA: Diagnosis not present

## 2022-08-30 DIAGNOSIS — D631 Anemia in chronic kidney disease: Secondary | ICD-10-CM | POA: Diagnosis not present

## 2022-08-30 DIAGNOSIS — Z992 Dependence on renal dialysis: Secondary | ICD-10-CM | POA: Diagnosis not present

## 2022-09-01 DIAGNOSIS — N2581 Secondary hyperparathyroidism of renal origin: Secondary | ICD-10-CM | POA: Diagnosis not present

## 2022-09-01 DIAGNOSIS — D509 Iron deficiency anemia, unspecified: Secondary | ICD-10-CM | POA: Diagnosis not present

## 2022-09-01 DIAGNOSIS — N186 End stage renal disease: Secondary | ICD-10-CM | POA: Diagnosis not present

## 2022-09-01 DIAGNOSIS — D631 Anemia in chronic kidney disease: Secondary | ICD-10-CM | POA: Diagnosis not present

## 2022-09-01 DIAGNOSIS — Z992 Dependence on renal dialysis: Secondary | ICD-10-CM | POA: Diagnosis not present

## 2022-09-03 DIAGNOSIS — D631 Anemia in chronic kidney disease: Secondary | ICD-10-CM | POA: Diagnosis not present

## 2022-09-03 DIAGNOSIS — N2581 Secondary hyperparathyroidism of renal origin: Secondary | ICD-10-CM | POA: Diagnosis not present

## 2022-09-03 DIAGNOSIS — Z992 Dependence on renal dialysis: Secondary | ICD-10-CM | POA: Diagnosis not present

## 2022-09-03 DIAGNOSIS — N186 End stage renal disease: Secondary | ICD-10-CM | POA: Diagnosis not present

## 2022-09-03 DIAGNOSIS — D509 Iron deficiency anemia, unspecified: Secondary | ICD-10-CM | POA: Diagnosis not present

## 2022-09-05 ENCOUNTER — Telehealth: Payer: Self-pay | Admitting: *Deleted

## 2022-09-05 NOTE — Telephone Encounter (Signed)
I attempted to contact patient by telephone but was unsuccessful. According to the patient's chart they are due for follow uop with [facility]. I have left a HIPAA compliant message advising the patient to contact [facility] at [phone]. I will continue to follow up with the patient to make sure this appointment is scheduled.

## 2022-09-06 DIAGNOSIS — D509 Iron deficiency anemia, unspecified: Secondary | ICD-10-CM | POA: Diagnosis not present

## 2022-09-06 DIAGNOSIS — N186 End stage renal disease: Secondary | ICD-10-CM | POA: Diagnosis not present

## 2022-09-06 DIAGNOSIS — D631 Anemia in chronic kidney disease: Secondary | ICD-10-CM | POA: Diagnosis not present

## 2022-09-06 DIAGNOSIS — Z992 Dependence on renal dialysis: Secondary | ICD-10-CM | POA: Diagnosis not present

## 2022-09-06 DIAGNOSIS — N2581 Secondary hyperparathyroidism of renal origin: Secondary | ICD-10-CM | POA: Diagnosis not present

## 2022-09-08 ENCOUNTER — Encounter: Payer: Self-pay | Admitting: *Deleted

## 2022-09-08 DIAGNOSIS — D631 Anemia in chronic kidney disease: Secondary | ICD-10-CM | POA: Diagnosis not present

## 2022-09-08 DIAGNOSIS — D509 Iron deficiency anemia, unspecified: Secondary | ICD-10-CM | POA: Diagnosis not present

## 2022-09-08 DIAGNOSIS — N186 End stage renal disease: Secondary | ICD-10-CM | POA: Diagnosis not present

## 2022-09-08 DIAGNOSIS — N2581 Secondary hyperparathyroidism of renal origin: Secondary | ICD-10-CM | POA: Diagnosis not present

## 2022-09-08 DIAGNOSIS — Z992 Dependence on renal dialysis: Secondary | ICD-10-CM | POA: Diagnosis not present

## 2022-09-08 NOTE — Telephone Encounter (Signed)
I attempted to contact patient by telephone but was unsuccessful. According to the patient's chart they are due for follow uop with [facility]. I have left a HIPAA compliant message advising the patient to contact [facility] at [phone]. I will continue to follow up with the patient to make sure this appointment is scheduled.

## 2022-09-10 DIAGNOSIS — D631 Anemia in chronic kidney disease: Secondary | ICD-10-CM | POA: Diagnosis not present

## 2022-09-10 DIAGNOSIS — D509 Iron deficiency anemia, unspecified: Secondary | ICD-10-CM | POA: Diagnosis not present

## 2022-09-10 DIAGNOSIS — N2581 Secondary hyperparathyroidism of renal origin: Secondary | ICD-10-CM | POA: Diagnosis not present

## 2022-09-10 DIAGNOSIS — N186 End stage renal disease: Secondary | ICD-10-CM | POA: Diagnosis not present

## 2022-09-10 DIAGNOSIS — Z992 Dependence on renal dialysis: Secondary | ICD-10-CM | POA: Diagnosis not present

## 2022-09-13 DIAGNOSIS — D631 Anemia in chronic kidney disease: Secondary | ICD-10-CM | POA: Diagnosis not present

## 2022-09-13 DIAGNOSIS — N186 End stage renal disease: Secondary | ICD-10-CM | POA: Diagnosis not present

## 2022-09-13 DIAGNOSIS — Z992 Dependence on renal dialysis: Secondary | ICD-10-CM | POA: Diagnosis not present

## 2022-09-13 DIAGNOSIS — D509 Iron deficiency anemia, unspecified: Secondary | ICD-10-CM | POA: Diagnosis not present

## 2022-09-13 DIAGNOSIS — N2581 Secondary hyperparathyroidism of renal origin: Secondary | ICD-10-CM | POA: Diagnosis not present

## 2022-09-14 DIAGNOSIS — I129 Hypertensive chronic kidney disease with stage 1 through stage 4 chronic kidney disease, or unspecified chronic kidney disease: Secondary | ICD-10-CM | POA: Diagnosis not present

## 2022-09-14 DIAGNOSIS — Z992 Dependence on renal dialysis: Secondary | ICD-10-CM | POA: Diagnosis not present

## 2022-09-14 DIAGNOSIS — N186 End stage renal disease: Secondary | ICD-10-CM | POA: Diagnosis not present

## 2022-09-15 DIAGNOSIS — D509 Iron deficiency anemia, unspecified: Secondary | ICD-10-CM | POA: Diagnosis not present

## 2022-09-15 DIAGNOSIS — N186 End stage renal disease: Secondary | ICD-10-CM | POA: Diagnosis not present

## 2022-09-15 DIAGNOSIS — Z992 Dependence on renal dialysis: Secondary | ICD-10-CM | POA: Diagnosis not present

## 2022-09-15 DIAGNOSIS — N2581 Secondary hyperparathyroidism of renal origin: Secondary | ICD-10-CM | POA: Diagnosis not present

## 2022-09-17 DIAGNOSIS — N186 End stage renal disease: Secondary | ICD-10-CM | POA: Diagnosis not present

## 2022-09-17 DIAGNOSIS — N2581 Secondary hyperparathyroidism of renal origin: Secondary | ICD-10-CM | POA: Diagnosis not present

## 2022-09-17 DIAGNOSIS — Z992 Dependence on renal dialysis: Secondary | ICD-10-CM | POA: Diagnosis not present

## 2022-09-17 DIAGNOSIS — D509 Iron deficiency anemia, unspecified: Secondary | ICD-10-CM | POA: Diagnosis not present

## 2022-09-20 DIAGNOSIS — Z992 Dependence on renal dialysis: Secondary | ICD-10-CM | POA: Diagnosis not present

## 2022-09-20 DIAGNOSIS — D509 Iron deficiency anemia, unspecified: Secondary | ICD-10-CM | POA: Diagnosis not present

## 2022-09-20 DIAGNOSIS — N186 End stage renal disease: Secondary | ICD-10-CM | POA: Diagnosis not present

## 2022-09-20 DIAGNOSIS — N2581 Secondary hyperparathyroidism of renal origin: Secondary | ICD-10-CM | POA: Diagnosis not present

## 2022-09-22 DIAGNOSIS — D509 Iron deficiency anemia, unspecified: Secondary | ICD-10-CM | POA: Diagnosis not present

## 2022-09-22 DIAGNOSIS — N186 End stage renal disease: Secondary | ICD-10-CM | POA: Diagnosis not present

## 2022-09-22 DIAGNOSIS — N2581 Secondary hyperparathyroidism of renal origin: Secondary | ICD-10-CM | POA: Diagnosis not present

## 2022-09-22 DIAGNOSIS — Z992 Dependence on renal dialysis: Secondary | ICD-10-CM | POA: Diagnosis not present

## 2022-09-24 DIAGNOSIS — Z992 Dependence on renal dialysis: Secondary | ICD-10-CM | POA: Diagnosis not present

## 2022-09-24 DIAGNOSIS — N2581 Secondary hyperparathyroidism of renal origin: Secondary | ICD-10-CM | POA: Diagnosis not present

## 2022-09-24 DIAGNOSIS — N186 End stage renal disease: Secondary | ICD-10-CM | POA: Diagnosis not present

## 2022-09-24 DIAGNOSIS — D509 Iron deficiency anemia, unspecified: Secondary | ICD-10-CM | POA: Diagnosis not present

## 2022-09-27 DIAGNOSIS — D509 Iron deficiency anemia, unspecified: Secondary | ICD-10-CM | POA: Diagnosis not present

## 2022-09-27 DIAGNOSIS — N186 End stage renal disease: Secondary | ICD-10-CM | POA: Diagnosis not present

## 2022-09-27 DIAGNOSIS — Z992 Dependence on renal dialysis: Secondary | ICD-10-CM | POA: Diagnosis not present

## 2022-09-27 DIAGNOSIS — N2581 Secondary hyperparathyroidism of renal origin: Secondary | ICD-10-CM | POA: Diagnosis not present

## 2022-09-29 DIAGNOSIS — D509 Iron deficiency anemia, unspecified: Secondary | ICD-10-CM | POA: Diagnosis not present

## 2022-09-29 DIAGNOSIS — Z992 Dependence on renal dialysis: Secondary | ICD-10-CM | POA: Diagnosis not present

## 2022-09-29 DIAGNOSIS — N2581 Secondary hyperparathyroidism of renal origin: Secondary | ICD-10-CM | POA: Diagnosis not present

## 2022-09-29 DIAGNOSIS — N186 End stage renal disease: Secondary | ICD-10-CM | POA: Diagnosis not present

## 2022-10-01 DIAGNOSIS — D509 Iron deficiency anemia, unspecified: Secondary | ICD-10-CM | POA: Diagnosis not present

## 2022-10-01 DIAGNOSIS — N186 End stage renal disease: Secondary | ICD-10-CM | POA: Diagnosis not present

## 2022-10-01 DIAGNOSIS — Z992 Dependence on renal dialysis: Secondary | ICD-10-CM | POA: Diagnosis not present

## 2022-10-01 DIAGNOSIS — N2581 Secondary hyperparathyroidism of renal origin: Secondary | ICD-10-CM | POA: Diagnosis not present

## 2022-10-03 ENCOUNTER — Other Ambulatory Visit: Payer: Self-pay | Admitting: Internal Medicine

## 2022-10-04 DIAGNOSIS — Z992 Dependence on renal dialysis: Secondary | ICD-10-CM | POA: Diagnosis not present

## 2022-10-04 DIAGNOSIS — D509 Iron deficiency anemia, unspecified: Secondary | ICD-10-CM | POA: Diagnosis not present

## 2022-10-04 DIAGNOSIS — N2581 Secondary hyperparathyroidism of renal origin: Secondary | ICD-10-CM | POA: Diagnosis not present

## 2022-10-04 DIAGNOSIS — N186 End stage renal disease: Secondary | ICD-10-CM | POA: Diagnosis not present

## 2022-10-06 DIAGNOSIS — Z992 Dependence on renal dialysis: Secondary | ICD-10-CM | POA: Diagnosis not present

## 2022-10-06 DIAGNOSIS — D509 Iron deficiency anemia, unspecified: Secondary | ICD-10-CM | POA: Diagnosis not present

## 2022-10-06 DIAGNOSIS — N186 End stage renal disease: Secondary | ICD-10-CM | POA: Diagnosis not present

## 2022-10-06 DIAGNOSIS — N2581 Secondary hyperparathyroidism of renal origin: Secondary | ICD-10-CM | POA: Diagnosis not present

## 2022-10-10 DIAGNOSIS — Z992 Dependence on renal dialysis: Secondary | ICD-10-CM | POA: Diagnosis not present

## 2022-10-10 DIAGNOSIS — N2581 Secondary hyperparathyroidism of renal origin: Secondary | ICD-10-CM | POA: Diagnosis not present

## 2022-10-10 DIAGNOSIS — D509 Iron deficiency anemia, unspecified: Secondary | ICD-10-CM | POA: Diagnosis not present

## 2022-10-10 DIAGNOSIS — N186 End stage renal disease: Secondary | ICD-10-CM | POA: Diagnosis not present

## 2022-10-11 DIAGNOSIS — Z992 Dependence on renal dialysis: Secondary | ICD-10-CM | POA: Diagnosis not present

## 2022-10-11 DIAGNOSIS — N2581 Secondary hyperparathyroidism of renal origin: Secondary | ICD-10-CM | POA: Diagnosis not present

## 2022-10-11 DIAGNOSIS — N186 End stage renal disease: Secondary | ICD-10-CM | POA: Diagnosis not present

## 2022-10-11 DIAGNOSIS — D509 Iron deficiency anemia, unspecified: Secondary | ICD-10-CM | POA: Diagnosis not present

## 2022-10-13 DIAGNOSIS — N2581 Secondary hyperparathyroidism of renal origin: Secondary | ICD-10-CM | POA: Diagnosis not present

## 2022-10-13 DIAGNOSIS — N186 End stage renal disease: Secondary | ICD-10-CM | POA: Diagnosis not present

## 2022-10-13 DIAGNOSIS — Z992 Dependence on renal dialysis: Secondary | ICD-10-CM | POA: Diagnosis not present

## 2022-10-13 DIAGNOSIS — D509 Iron deficiency anemia, unspecified: Secondary | ICD-10-CM | POA: Diagnosis not present

## 2022-10-15 DIAGNOSIS — D509 Iron deficiency anemia, unspecified: Secondary | ICD-10-CM | POA: Diagnosis not present

## 2022-10-15 DIAGNOSIS — Z992 Dependence on renal dialysis: Secondary | ICD-10-CM | POA: Diagnosis not present

## 2022-10-15 DIAGNOSIS — N186 End stage renal disease: Secondary | ICD-10-CM | POA: Diagnosis not present

## 2022-10-15 DIAGNOSIS — N2581 Secondary hyperparathyroidism of renal origin: Secondary | ICD-10-CM | POA: Diagnosis not present

## 2022-10-15 DIAGNOSIS — I129 Hypertensive chronic kidney disease with stage 1 through stage 4 chronic kidney disease, or unspecified chronic kidney disease: Secondary | ICD-10-CM | POA: Diagnosis not present

## 2022-10-28 ENCOUNTER — Encounter (HOSPITAL_COMMUNITY): Payer: Self-pay

## 2022-10-28 DIAGNOSIS — H5203 Hypermetropia, bilateral: Secondary | ICD-10-CM | POA: Diagnosis not present

## 2022-11-01 DIAGNOSIS — N261 Atrophy of kidney (terminal): Secondary | ICD-10-CM | POA: Diagnosis not present

## 2022-11-01 DIAGNOSIS — N179 Acute kidney failure, unspecified: Secondary | ICD-10-CM | POA: Diagnosis not present

## 2022-11-01 DIAGNOSIS — K4010 Bilateral inguinal hernia, with gangrene, not specified as recurrent: Secondary | ICD-10-CM | POA: Diagnosis not present

## 2022-11-07 DIAGNOSIS — L83 Acanthosis nigricans: Secondary | ICD-10-CM | POA: Diagnosis not present

## 2022-11-14 DIAGNOSIS — I129 Hypertensive chronic kidney disease with stage 1 through stage 4 chronic kidney disease, or unspecified chronic kidney disease: Secondary | ICD-10-CM | POA: Diagnosis not present

## 2022-11-14 DIAGNOSIS — Z992 Dependence on renal dialysis: Secondary | ICD-10-CM | POA: Diagnosis not present

## 2022-11-14 DIAGNOSIS — N186 End stage renal disease: Secondary | ICD-10-CM | POA: Diagnosis not present

## 2022-11-15 DIAGNOSIS — Z7682 Awaiting organ transplant status: Secondary | ICD-10-CM | POA: Diagnosis not present

## 2022-11-24 DIAGNOSIS — M4727 Other spondylosis with radiculopathy, lumbosacral region: Secondary | ICD-10-CM | POA: Diagnosis not present

## 2022-11-25 DIAGNOSIS — R0981 Nasal congestion: Secondary | ICD-10-CM | POA: Diagnosis not present

## 2022-11-25 DIAGNOSIS — J342 Deviated nasal septum: Secondary | ICD-10-CM | POA: Diagnosis not present

## 2022-12-15 DIAGNOSIS — I129 Hypertensive chronic kidney disease with stage 1 through stage 4 chronic kidney disease, or unspecified chronic kidney disease: Secondary | ICD-10-CM | POA: Diagnosis not present

## 2022-12-15 DIAGNOSIS — Z992 Dependence on renal dialysis: Secondary | ICD-10-CM | POA: Diagnosis not present

## 2022-12-15 DIAGNOSIS — N186 End stage renal disease: Secondary | ICD-10-CM | POA: Diagnosis not present

## 2023-01-09 DIAGNOSIS — H903 Sensorineural hearing loss, bilateral: Secondary | ICD-10-CM | POA: Diagnosis not present

## 2023-01-09 DIAGNOSIS — K117 Disturbances of salivary secretion: Secondary | ICD-10-CM | POA: Diagnosis not present

## 2023-01-09 DIAGNOSIS — K219 Gastro-esophageal reflux disease without esophagitis: Secondary | ICD-10-CM | POA: Diagnosis not present

## 2023-01-14 DIAGNOSIS — Z992 Dependence on renal dialysis: Secondary | ICD-10-CM | POA: Diagnosis not present

## 2023-01-14 DIAGNOSIS — N186 End stage renal disease: Secondary | ICD-10-CM | POA: Diagnosis not present

## 2023-01-14 DIAGNOSIS — I129 Hypertensive chronic kidney disease with stage 1 through stage 4 chronic kidney disease, or unspecified chronic kidney disease: Secondary | ICD-10-CM | POA: Diagnosis not present

## 2023-02-01 DIAGNOSIS — M545 Low back pain, unspecified: Secondary | ICD-10-CM | POA: Diagnosis not present

## 2023-02-01 DIAGNOSIS — M25562 Pain in left knee: Secondary | ICD-10-CM | POA: Diagnosis not present

## 2023-02-01 DIAGNOSIS — M25561 Pain in right knee: Secondary | ICD-10-CM | POA: Diagnosis not present

## 2023-02-01 DIAGNOSIS — M542 Cervicalgia: Secondary | ICD-10-CM | POA: Diagnosis not present

## 2023-02-01 DIAGNOSIS — R519 Headache, unspecified: Secondary | ICD-10-CM | POA: Diagnosis not present

## 2023-02-01 DIAGNOSIS — G8929 Other chronic pain: Secondary | ICD-10-CM | POA: Diagnosis not present

## 2023-02-01 DIAGNOSIS — M549 Dorsalgia, unspecified: Secondary | ICD-10-CM | POA: Diagnosis not present

## 2023-02-14 DIAGNOSIS — Z992 Dependence on renal dialysis: Secondary | ICD-10-CM | POA: Diagnosis not present

## 2023-02-14 DIAGNOSIS — N186 End stage renal disease: Secondary | ICD-10-CM | POA: Diagnosis not present

## 2023-02-14 DIAGNOSIS — I129 Hypertensive chronic kidney disease with stage 1 through stage 4 chronic kidney disease, or unspecified chronic kidney disease: Secondary | ICD-10-CM | POA: Diagnosis not present

## 2023-03-23 DIAGNOSIS — H524 Presbyopia: Secondary | ICD-10-CM | POA: Diagnosis not present

## 2023-03-25 DIAGNOSIS — H524 Presbyopia: Secondary | ICD-10-CM | POA: Diagnosis not present

## 2023-03-25 DIAGNOSIS — H52223 Regular astigmatism, bilateral: Secondary | ICD-10-CM | POA: Diagnosis not present

## 2023-04-14 DIAGNOSIS — I129 Hypertensive chronic kidney disease with stage 1 through stage 4 chronic kidney disease, or unspecified chronic kidney disease: Secondary | ICD-10-CM | POA: Diagnosis not present

## 2023-04-14 DIAGNOSIS — N186 End stage renal disease: Secondary | ICD-10-CM | POA: Diagnosis not present

## 2023-04-14 DIAGNOSIS — Z992 Dependence on renal dialysis: Secondary | ICD-10-CM | POA: Diagnosis not present

## 2023-04-17 ENCOUNTER — Encounter (HOSPITAL_COMMUNITY): Payer: Self-pay

## 2023-04-24 ENCOUNTER — Ambulatory Visit: Attending: Internal Medicine | Admitting: Internal Medicine

## 2023-04-24 ENCOUNTER — Encounter: Payer: Self-pay | Admitting: Internal Medicine

## 2023-04-24 VITALS — BP 160/86 | HR 80 | Ht 69.0 in | Wt 245.2 lb

## 2023-04-24 DIAGNOSIS — I5022 Chronic systolic (congestive) heart failure: Secondary | ICD-10-CM | POA: Diagnosis not present

## 2023-04-24 DIAGNOSIS — I110 Hypertensive heart disease with heart failure: Secondary | ICD-10-CM

## 2023-04-24 DIAGNOSIS — E785 Hyperlipidemia, unspecified: Secondary | ICD-10-CM

## 2023-04-24 DIAGNOSIS — Z992 Dependence on renal dialysis: Secondary | ICD-10-CM

## 2023-04-24 DIAGNOSIS — N186 End stage renal disease: Secondary | ICD-10-CM | POA: Diagnosis not present

## 2023-04-24 MED ORDER — VASCEPA 1 G PO CAPS: 2.0000 g | ORAL_CAPSULE | Freq: Two times a day (BID) | ORAL | 3 refills | Status: AC

## 2023-04-24 MED ORDER — ATORVASTATIN CALCIUM 40 MG PO TABS: 40.0000 mg | ORAL_TABLET | Freq: Every day | ORAL | 3 refills | Status: AC

## 2023-04-24 NOTE — Progress Notes (Signed)
 LIPID CLINIC CONSULT NOTE  Chief Complaint:  Follow-up dyslipidemia  Primary Care Physician: Georgina Quint, MD  Primary Cardiologist:  Chrystie Nose, MD  HPI:  [name] is a 58 y.o. male who is being seen today for the evaluation of dyslipidemia at the request of Georgina Quint, *.  This is a pleasant 58 year old Arabic male who was seen today with a Adult nurse.  His past medical history significant for chronic kidney disease which is stage IV and currently a GFR of around 10.  He has been followed by Dr. Hyman Hopes with nephrology and has an appointment in October to see Dr. Chestine Spore with vascular surgery for creation of a fistula.  He has had longstanding dyslipidemia and multiple medication intolerances.  He was previously followed in the [facility] lipid clinic by Terie Purser, PharmD.  When he was last seen in November 2019, he was on rosuvastatin 10 mg daily and Lovaza 2 g twice daily.  He was reportedly intolerance to atorvastatin 80 mg causing myalgias and fenofibrate 160 mg causing myalgias.  I reviewed the medicine list with him today and looked at all the medications that were in his possession.  After working with the translator we determined that he was still taking the rosuvastatin 10 mg daily and also fenofibrate 160 mg daily.  He did not have any of the Lovaza.  Previously he was also evaluated apparently for Vascepa but felt to be unable to obtain that due to financial constraints.  He is working and does have [employer] but is in application for disability.  There is a strong family history of coronary disease in multiple family members.  His only other complaint today is some shortness of breath and next edema.  Is difficult to know whether shortness of breath may be related to advanced chronic kidney disease or coronary disease.  Given his advanced creatinine, it is unlikely that he could have any dye related cardiovascular work-up.  His last stress test  was in 2015.  His most recent lipids from August 24, 2018 showed total cholesterol 313, triglycerides 1340, HDL 24 and direct LDL of 63.  03/12/2019  [name] returns today for follow-up of hypertriglyceridemia.  He has been taking Vascepa 2 g twice daily.  This has caused a resultant decrease in his triglycerides from 1340-741.  Although this is still elevated, represents a significant reduction.  It is difficult to get his triglycerides much lower given his underlying renal disease.  Unfortunately he was not a candidate for a fibrate.  He reports other medication intolerances.  With this medicine he has says through the translator that he thinks he is having headaches and possibly some abdominal pain.  I advised him to decrease the dose to 1 g twice daily, although not as effective, it may have less chance for side effect.  If this is better tolerated he should continue it and if not then he will have no choice but to stop it.  I do not see any other options for lowering triglycerides at this time other than strict diet a very low saturated fats less than 5 to 10% of calories.  I provided him with this information today including dietary recommendations.  09/02/2019  [name] was seen today for follow-up with a professional Print production planner via the [employer]  He had marked improvement in his triglycerides on Vascepa however unfortunately had progressive renal dysfunction and ultimately has gone on dialysis.  Prior to that  he was having headaches and other symptoms which I believe are related to end-stage renal disease however he felt it could be related to the Vascepa and discontinue the medicine.  Not surprisingly his triglycerides have accordingly increased.  At one point triglycerides were well over thousand and had come down to 305 but recently went up to 607.  12/04/2019  [name] is seen today in follow-up.  He is not accompanied by a Nurse, learning disability.  Unfortunately his English is  difficult to understand.  I did note that his lipids have essentially been unchanged.  Triglycerides are still elevated at 746 despite the fact that he reports taking the Vascepa 2 g twice daily.  He is on dialysis.  Unfortunately he recently had COVID-19 was treated with monoclonal antibody therapy about a month ago.  He says he is recovered from that.  05/12/2020  [name] returns for follow-up.  He has not had recent lipid testing.  He reports compliance with his Vascepa.  He says he is having some difficulty getting it.  Triglycerides have been significantly elevated.  Because he is end-stage renal disease he is not on a statin.  He is followed by Dr. Ronalee Belts.  He recently been having some issues with headache after dialysis.  A CT scan was ordered of the head and neck and performed in early March and was negative.  I provided those results to them today.  04/24/2023  [name] returns today with a professional Print production planner.  He said [facility] and [facility].  The echo showed LVEF 55% with grade 2 diastolic dysfunction and some pulmonary hypertension.  Stress test was negative for ischemia although did show a fixed inferior defect thought to be attenuation artifact.  Overall study was low risk.  He had denied any chest pain.  He reports he does get short of breath right before he is ready for dialysis.  He is also had issues with difficult to control hypertension.  His lipids have been poorly controlled.  Most of this is secondary.  Recent labs showed total cholesterol 313 with an LDL 135 and triglycerides over 1000.  He says he is out of his Vascepa, but that the medicine may have caused him some side effects.  We talked about statin which she has previously tried both atorvastatin and rosuvastatin and said that they may have caused him side effects.  I was speaking with him about some other options and reiterated the fact that there  is very little if any mortality data for statins for patients with end-stage renal disease on dialysis.  PMHx:  Past Medical History:  Diagnosis Date   Anemia    Back pain    CKD (chronic kidney disease), stage III (HCC)    Stage 4   Colon polyps    adenomatous   Elevated cholesterol    History of echocardiogram    Echo 11/17: EF 65-70, normal wall motion, grade 1 diastolic dysfunction, trivial MR, mild LAE, mild TR   Hyperlipidemia    Hypertension    no current bp meds for last 3 months   Kidney stones 2007   Medication intolerance    a. multiple with prior nonadherence to regimen.   Nausea    " in the morning since having the kidney problem "   Otosclerosis of both ears    Pneumonia    PONV (postoperative nausea and vomiting)    PUD (peptic ulcer disease)  Has had unspecified surgery for this   Sleep apnea     Past Surgical History:  Procedure Laterality Date   AV FISTULA PLACEMENT Left 01/04/2019   Procedure: ARTERIOVENOUS (AV) FISTULA CREATION LEFT ARM;  Surgeon: Cephus Shelling, MD;  Location: [facility] OR;  Service: Vascular;  Laterality: Left;   BASCILIC VEIN TRANSPOSITION Left 03/11/2019   Procedure: BASILIC VEIN TRANSPOSITION 2ND STAGE LEFT;  Surgeon: Cephus Shelling, MD;  Location: [facility] OR;  Service: Vascular;  Laterality: Left;   NEPHRECTOMY  02/18/2011   Procedure: NEPHRECTOMY;  Surgeon: Lindaann Slough, MD;  Location: [facility] ORS;  Service: Urology;  Laterality: Right;   SMALL INTESTINE SURGERY     STAPEDOTOMY  2005   lt ear jan, right ear sept   surgery for ulcers  1990    FAMHx:  Family History  Problem Relation Age of Onset   Hyperlipidemia Father    Heart disease Mother    Hypertension Mother    Hypertension Sister    Heart disease Brother 2       CABG   Hyperlipidemia Sister    Hyperlipidemia Sister    Esophageal cancer Cousin    Healthy Child    Colon cancer Neg Hx    Stomach cancer Neg Hx    Rectal cancer Neg Hx     SOCHx:   reports that  he has never smoked. He has never used smokeless tobacco. He reports that he does not drink alcohol and does not use drugs.  ALLERGIES:  Allergies  Allergen Reactions   Lokelma [Sodium Zirconium Cyclosilicate] Shortness Of Breath   Beef-Derived Drug Products Other (See Comments)    Cultural preference   Pegademase Bovine Other (See Comments)    Cultural preference   Poractant Alfa Other (See Comments)    Cultural preference   Pork-Derived Products Other (See Comments)    Cultural preference    ROS: Pertinent items noted in HPI and remainder of comprehensive ROS otherwise negative.  HOME MEDS: Current Outpatient Medications on File Prior to Visit  Medication Sig Dispense Refill   acetaminophen (TYLENOL) 325 MG tablet Take 325 mg by mouth as needed.     B Complex-C-Zn-Folic Acid (DIALYVITE 800 WITH ZINC) 0.8 MG TABS Take 1 tablet by mouth daily.     benzoyl peroxide (BENZOYL PEROXIDE) 5 % external liquid Apply topically 2 (two) times daily. 142 g 12   Betamethasone Sodium Phosphate 6 MG/ML SOLN INJECT 2:4 SYRINGE BILATERALLY INTO EACH KNEE JOINT     calcium acetate (PHOSLO) 667 MG capsule Take 2 capsules (1,334 mg total) by mouth 3 (three) times daily with meals. 180 capsule 3   lidocaine-prilocaine (EMLA) cream Apply topically.     losartan (COZAAR) 25 MG tablet Take 25 mg by mouth daily.     pantoprazole (PROTONIX) 40 MG tablet Take 1 tablet (40 mg total) by mouth daily. 30 tablet 5   VASCEPA 1 g capsule TAKE TWO (2) CAPSULES BY MOUTH TWICE DAILY *MUST KEEP APPOINTMENT FOR FURTHER REFILLS* 120 capsule 10   amLODipine (NORVASC) 5 MG tablet Take 1 tablet (5 mg total) by mouth daily. 30 tablet 0   carvedilol (COREG) 25 MG tablet Take 1 tablet (25 mg total) by mouth 2 (two) times daily with a meal. 60 tablet 0   hydrALAZINE (APRESOLINE) 25 MG tablet Take 1 tablet (25 mg total) by mouth every 8 (eight) hours. 90 tablet 0   No current facility-administered medications on file prior to  visit.    LABS/IMAGING:  No results found for this or any previous visit (from the past 48 hours). No results found.  LIPID PANEL:    Component Value Date/Time   CHOL 330 (H) 03/23/2021 1340   CHOL 365 (H) 05/12/2020 1504   TRIG (H) 03/23/2021 1340    1167.0 Triglyceride is over 400; calculations on Lipids are invalid.   HDL 27.40 (L) 03/23/2021 1340   HDL 26 (L) 05/12/2020 1504   CHOLHDL 12 03/23/2021 1340   VLDL 61 (H) 04/13/2019 0450   LDLCALC Comment (A) 05/12/2020 1504   LDLDIRECT 53.0 03/23/2021 1340    WEIGHTS: Wt Readings from Last 3 Encounters:  04/24/23 245 lb 3.2 oz (111.2 kg)  12/29/21 243 lb 6 oz (110.4 kg)  11/09/21 236 lb 15.9 oz (107.5 kg)    VITALS: BP (!) 160/86 (BP Location: Left Arm, Patient Position: Sitting, Cuff Size: Large)   Pulse 80   Ht 5\' 9"  (1.753 m)   Wt 245 lb 3.2 oz (111.2 kg)   SpO2 96%   BMI 36.21 kg/m   EXAM: General appearance: alert and no distress Lungs: clear to auscultation bilaterally Heart: regular rate and rhythm Extremities: extremities normal, atraumatic, no cyanosis or edema and fistula of the left upper extremity, positive thrill Neurologic: Grossly normal  EKG: Deferred  ASSESSMENT: Secondary hypertriglyceridemia secondary to advanced kidney disease Progressive dyspnea on exertion, ? coronary equivalent ESRD-now on dialysis Low risk Myoview stress test with fixed inferior perfusion defect (02/2023) LVEF 55% with grade 2 diastolic dysfunction and pulmonary hypertension (02/2023)  PLAN: 1.   [name] continues to have poorly treated dyslipidemia.  He is tried a couple statins in the past and apparently had some myalgias but says he wants to try them again.  He also had been on Vascepa but says that he wants a prescription for that again.  I have encouraged him not to try 2 medications at once but he said he knows how he felt on either medicine before and if he is feels no side effects again he would reach out to [place].   Therefore we will prescribe both atorvastatin and Vascepa.  If this is tolerated better then he can follow-up with [place] and repeat lipids in about 6 months.  I reassured him based on his stress test and echo findings.  He apparently is undergoing workup for renal transplant but he says he has been listed for a long time.  There are notes indicating that his nephrologist may be Karie Chimera however he says he does not go to see her or [facility].  I encouraged him to follow-up with his nephrologist or dialysis center physician.  Chrystie Nose, MD, [facility], FACP  [place]  [facility]  Medical Director of the Advanced Lipid Disorders &  Cardiovascular Risk Reduction Clinic Diplomate of the American Board of Clinical Lipidology Attending Cardiologist  Direct Dial: [phone]  Fax: [phone]  Website:  [email or web address] 04/24/2023, 12:29 PM

## 2023-04-24 NOTE — Patient Instructions (Signed)
 Medication Instructions:   Tillman Sers has been refilled  START atorvastatin 40mg  once daily for cholesterol   *If you need a refill on your cardiac medications before your next appointment, please call your pharmacy*   Lab Work: FASTING lab work to check cholesterol in 6 months   If you have labs (blood work) drawn today and your tests are completely normal, you will receive your results only by: MyChart Message (if you have MyChart) OR A paper copy in the mail If you have any lab test that is abnormal or we need to change your treatment, we will call you to review the results.   Follow-Up: At [facility], you and your health needs are our priority.  As part of our continuing mission to provide you with exceptional heart care, we have created designated Provider Care Teams.  These Care Teams include your primary Cardiologist (physician) and Advanced Practice Providers (APPs -  Physician Assistants and Nurse Practitioners) who all work together to provide you with the care you need, when you need it.  We recommend signing up for the patient portal called "MyChart".  Sign up information is provided on this After Visit Summary.  MyChart is used to connect with patients for Virtual Visits (Telemedicine).  Patients are able to view lab/test results, encounter notes, upcoming appointments, etc.  Non-urgent messages can be sent to your provider as well.   To learn more about what you can do with MyChart, go to [email or web address].    Your next appointment:    6 months with Dr. Rennis Golden or Eligha Bridegroom NP Other Instructions   1st Floor: - Lobby - Registration  - Pharmacy  - Lab - Cafe  2nd Floor: - PV Lab - Diagnostic Testing (echo, CT, nuclear med)  3rd Floor: - Vacant  4th Floor: - TCTS (cardiothoracic surgery) - AFib Clinic - Structural Heart Clinic - Vascular Surgery  - Vascular Ultrasound  5th Floor: - HeartCare Cardiology (general and EP) - Clinical  Pharmacy for coumadin, hypertension, lipid, weight-loss medications, and med management appointments    Valet parking services will be available as well.

## 2023-05-15 DIAGNOSIS — I129 Hypertensive chronic kidney disease with stage 1 through stage 4 chronic kidney disease, or unspecified chronic kidney disease: Secondary | ICD-10-CM | POA: Diagnosis not present

## 2023-05-15 DIAGNOSIS — N186 End stage renal disease: Secondary | ICD-10-CM | POA: Diagnosis not present

## 2023-05-15 DIAGNOSIS — Z992 Dependence on renal dialysis: Secondary | ICD-10-CM | POA: Diagnosis not present

## 2023-05-24 DIAGNOSIS — Z7682 Awaiting organ transplant status: Secondary | ICD-10-CM | POA: Diagnosis not present

## 2023-06-06 ENCOUNTER — Ambulatory Visit (INDEPENDENT_AMBULATORY_CARE_PROVIDER_SITE_OTHER)

## 2023-06-06 VITALS — Ht 69.0 in | Wt 245.0 lb

## 2023-06-06 DIAGNOSIS — Z0000 Encounter for general adult medical examination without abnormal findings: Secondary | ICD-10-CM | POA: Diagnosis not present

## 2023-06-06 NOTE — Progress Notes (Signed)
 Subjective:   [name] is a 58 y.o. who presents for a Medicare Wellness preventive visit.  Visit Complete: Virtual I connected with  [name] on 06/06/23 by a audio enabled telemedicine application and verified that I am speaking with the correct person using two identifiers.  Patient Location: Home  Provider Location: Home Office  I discussed the limitations of evaluation and management by telemedicine. The patient expressed understanding and agreed to proceed.  Some questions were not answered today due to language barrier and also the patient was getting agitated as questions were being asked.  Patient did schedule a visit with PCP to discuss concerns and will have an interpreter to help.  Vital Signs: Because this visit was a virtual/telehealth visit, some criteria may be missing or patient reported. Any vitals not documented were not able to be obtained and vitals that have been documented are patient reported.  VideoDeclined- This patient declined Librarian, academic. Therefore the visit was completed with audio only.  Persons Participating in Visit: Patient assisted by interpreter.  AWV Questionnaire: No: Patient Medicare AWV questionnaire was not completed prior to this visit.  Cardiac Risk Factors include: advanced age (>36men, >58 women);male gender;dyslipidemia;Other (see comment), Risk factor comments: CKD     Objective:    Today's Vitals   06/06/23 1015  Weight: 245 lb (111.1 kg)  Height: 5\' 9"  (1.753 m)   Body mass index is 36.18 kg/m.     11/08/2021    2:00 AM 04/14/2021   11:15 PM 09/15/2020   11:38 AM 05/26/2020   10:44 AM 04/11/2019   11:00 PM 03/11/2019    7:20 AM 01/04/2019    9:10 AM  Advanced Directives  Does Patient Have a Medical Advance Directive? No No No No No No No  Would patient like information on creating a medical advance directive? No - Patient declined No - Patient declined   No - Patient declined No - Patient  declined     Current Medications (verified) Outpatient Encounter Medications as of 06/06/2023  Medication Sig   acetaminophen  (TYLENOL ) 325 MG tablet Take 325 mg by mouth as needed.   atorvastatin  (LIPITOR) 40 MG tablet Take 1 tablet (40 mg total) by mouth daily.   B Complex-C-Zn-Folic Acid  (DIALYVITE 800 WITH ZINC) 0.8 MG TABS Take 1 tablet by mouth daily.   benzoyl peroxide  (BENZOYL PEROXIDE ) 5 % external liquid Apply topically 2 (two) times daily.   Betamethasone  Sodium Phosphate  6 MG/ML SOLN INJECT 2:4 SYRINGE BILATERALLY INTO EACH KNEE JOINT   calcium  acetate (PHOSLO ) 667 MG capsule Take 2 capsules (1,334 mg total) by mouth 3 (three) times daily with meals.   lidocaine -prilocaine (EMLA) cream Apply topically.   losartan (COZAAR) 25 MG tablet Take 25 mg by mouth daily.   pantoprazole  (PROTONIX ) 40 MG tablet Take 1 tablet (40 mg total) by mouth daily.   VASCEPA  1 g capsule Take 2 capsules (2 g total) by mouth 2 (two) times daily.   amLODipine  (NORVASC ) 5 MG tablet Take 1 tablet (5 mg total) by mouth daily.   carvedilol  (COREG ) 25 MG tablet Take 1 tablet (25 mg total) by mouth 2 (two) times daily with a meal.   hydrALAZINE  (APRESOLINE ) 25 MG tablet Take 1 tablet (25 mg total) by mouth every 8 (eight) hours.   No facility-administered encounter medications on file as of 06/06/2023.    Allergies (verified) Lokelma  [sodium zirconium cyclosilicate ], Beef-derived drug products, Pegademase bovine, Poractant alfa, and Pork-derived products   History:  Past Medical History:  Diagnosis Date   Anemia    Back pain    CKD (chronic kidney disease), stage III (HCC)    Stage 4   Colon polyps    adenomatous   Elevated cholesterol    History of echocardiogram    Echo 11/17: EF 65-70, normal wall motion, grade 1 diastolic dysfunction, trivial MR, mild LAE, mild TR   Hyperlipidemia    Hypertension    no current bp meds for last 3 months   Kidney stones 2007   Medication intolerance    a.  multiple with prior nonadherence to regimen.   Nausea    " in the morning since having the kidney problem "   Otosclerosis of both ears    Pneumonia    PONV (postoperative nausea and vomiting)    PUD (peptic ulcer disease)    Has had unspecified surgery for this   Sleep apnea    Past Surgical History:  Procedure Laterality Date   AV FISTULA PLACEMENT Left 01/04/2019   Procedure: ARTERIOVENOUS (AV) FISTULA CREATION LEFT ARM;  Surgeon: Andrew Hensen, MD;  Location: [facility] OR;  Service: Vascular;  Laterality: Left;   BASCILIC VEIN TRANSPOSITION Left 03/11/2019   Procedure: BASILIC VEIN TRANSPOSITION 2ND STAGE LEFT;  Surgeon: Andrew Hensen, MD;  Location: [facility] OR;  Service: Vascular;  Laterality: Left;   NEPHRECTOMY  02/18/2011   Procedure: NEPHRECTOMY;  Surgeon: Andrew Mounts, MD;  Location: [facility] ORS;  Service: Urology;  Laterality: Right;   SMALL INTESTINE SURGERY     STAPEDOTOMY  2005   lt ear jan, right ear sept   surgery for ulcers  1990   Family History  Problem Relation Age of Onset   Hyperlipidemia Father    Heart disease Mother    Hypertension Mother    Hypertension Sister    Heart disease Brother 62       CABG   Hyperlipidemia Sister    Hyperlipidemia Sister    Esophageal cancer Cousin    Healthy Child    Colon cancer Neg Hx    Stomach cancer Neg Hx    Rectal cancer Neg Hx    Social History   Socioeconomic History   Marital status: Married    Spouse name: [name]   Number of children: 5   Years of education: Not on file   Highest education level: 12th grade  Occupational History    Employer: [employer]  Tobacco Use   Smoking status: Never   Smokeless tobacco: Never  Vaping Use   Vaping status: Never Used  Substance and Sexual Activity   Alcohol use: No   Drug use: No   Sexual activity: Yes    Birth control/protection: None  Other Topics Concern   Not on file  Social History Narrative   Lives at home with wife and family. He is from [place].  Came to the [place]  in 2002.      Patient is right-handed. He lives in a one level home. He drinks 1-2 cups of coffee and tea a day. He does not exercise.   Social Drivers of Corporate investment banker Strain: Low Risk  (08/25/2018)   Overall Financial Resource Strain (CARDIA)    Difficulty of Paying Living Expenses: Not hard at all  Food Insecurity: Low Risk  (11/24/2022)   Received from [facility]   Hunger Vital Sign    Worried About Running Out of Food in the Last Year: Never true    Ran  Out of Food in the Last Year: Never true  Transportation Needs: No Transportation Needs (06/06/2023)   PRAPARE - Administrator, Civil Service (Medical): No    Lack of Transportation (Non-Medical): No  Physical Activity: Inactive (08/25/2018)   Exercise Vital Sign    Days of Exercise per Week: 0 days    Minutes of Exercise per Session: 0 min  Stress: No Stress Concern Present (08/25/2018)   [employer] of Occupational Health - Occupational Stress Questionnaire    Feeling of Stress : Not at all  Social Connections: Moderately Integrated (08/25/2018)   Social Connection and Isolation Panel [NHANES]    Frequency of Communication with Friends and Family: More than three times a week    Frequency of Social Gatherings with Friends and Family: More than three times a week    Attends Religious Services: More than 4 times per year    Active Member of [employer] or Organizations: No    Attends Engineer, structural: Never    Marital Status: Married    Tobacco Counseling Counseling given: Not Answered    Clinical Intake:  Pre-visit preparation completed: Yes  Pain : No/denies pain     BMI - recorded: 36.18 Nutritional Status: BMI > 30  Obese Nutritional Risks: None  Lab Results  Component Value Date   HGBA1C 4.8 04/13/2019   HGBA1C 5.4 10/11/2017   HGBA1C 5.3 04/04/2016     How often do you need to have someone help you when you read instructions, pamphlets, or other  written materials from your doctor or pharmacy?: 5 - Always  Interpreter Needed?: Yes Interpreter ID: [id number]      Activities of Daily Living     06/06/2023   10:19 AM  In your present state of health, do you have any difficulty performing the following activities:  Hearing? 0  Vision? 0  Difficulty concentrating or making decisions? 0  Walking or climbing stairs? 1  Comment after dialysis  Dressing or bathing? 0  Doing errands, shopping? 0  Preparing Food and eating ? N  Using the Toilet? N  In the past six months, have you accidently leaked urine? Y  Do you have problems with loss of bowel control? N  Managing your Medications? N  Managing your Finances? N  Housekeeping or managing your Housekeeping? N    Patient Care Team: Elvira Hammersmith, MD as PCP - General (Internal Medicine) Maximo Spar Aviva Lemmings, MD as PCP - Cardiology (Cardiology) Evangeline Hilts, MD as Consulting Physician (Gastroenterology) Jonette Nestle, [facility], [facility]  Indicate any recent Medical Services you may have received from other than [facility] providers in the past year (date may be approximate).     Assessment:   This is a routine wellness examination for [name].  Hearing/Vision screen Hearing Screening - Comments:: slighty   Goals Addressed   None    Depression Screen DID NOT DO TODAY    12/29/2021   11:08 AM 03/23/2021    1:19 PM 07/07/2020    1:31 PM 12/30/2019    3:34 PM 07/01/2019    3:20 PM 05/08/2019    2:51 PM 04/26/2019   11:39 AM  PHQ 2/9 Scores  PHQ - 2 Score 0 0 0 0 0 0 0    Fall Risk     06/06/2023   10:31 AM 12/29/2021   11:10 AM 12/29/2021   11:08 AM 03/23/2021    1:19 PM 09/15/2020   11:38 AM  Fall Risk   Falls in the past year? 1 0 0 0 0  Number falls in past yr: 0 0 0    Injury with Fall? 0 0 0    Risk for fall due to :  No Fall Risks No Fall Risks    Follow up Falls evaluation completed;Falls prevention discussed Falls  evaluation completed Falls evaluation completed      MEDICARE RISK AT HOME:  Medicare Risk at Home Any stairs in or around the home?: Yes If so, are there any without handrails?: Yes Home free of loose throw rugs in walkways, pet beds, electrical cords, etc?: Yes Adequate lighting in your home to reduce risk of falls?: Yes Life alert?: No Use of a cane, walker or w/c?: No Grab bars in the bathroom?: Yes Shower chair or bench in shower?: No Elevated toilet seat or a handicapped toilet?: No  TIMED UP AND GO:  Was the test performed?  No  Cognitive Function: Impaired: Cognitive status assessed by direct observation. Patient is unable to complete screening 6CIT or other cognitive screening test. Due to language barrier        Immunizations Immunization History  Administered Date(s) Administered   Hepatitis B 05/02/2019, 06/06/2019, 07/04/2019   Hepatitis B, PED/ADOLESCENT 05/02/2019, 06/06/2019, 07/04/2019   Hepb-cpg 10/03/2019, 06/26/2020, 07/27/2020, 08/26/2020, 10/30/2020   Influenza, Quadrivalent, Recombinant, Inj, Pf 12/25/2021   Moderna Sars-Covid-2 Vaccination 05/28/2020   PFIZER Comirnaty(Gray Top)Covid-19 Tri-Sucrose Vaccine 04/23/2020   Pfizer Covid-19 Vaccine Bivalent Booster 13yrs & up 11/18/2020   Pneumococcal Conjugate-13 07/13/2020   Tdap 01/22/2010    Screening Tests Health Maintenance  Topic Date Due   DTaP/Tdap/Td (2 - Td or Tdap) 01/23/2020   Pneumococcal Vaccine 8-64 Years old (2 of 2 - PPSV23) 09/07/2020   COVID-19 Vaccine (4 - 2024-25 season) 10/16/2022   INFLUENZA VACCINE  09/15/2023   Colonoscopy  03/27/2024   Medicare Annual Wellness (AWV)  06/05/2024   Hepatitis C Screening  Completed   HIV Screening  Completed   Zoster Vaccines- Shingrix  Completed   HPV VACCINES  Aged Out   Meningococcal B Vaccine  Aged Out    Health Maintenance  Health Maintenance Due  Topic Date Due   DTaP/Tdap/Td (2 - Td or Tdap) 01/23/2020   Pneumococcal Vaccine  73-45 Years old (2 of 2 - PPSV23) 09/07/2020   COVID-19 Vaccine (4 - 2024-25 season) 10/16/2022   Health Maintenance Items Addressed: See Nurse Notes  Additional Screening:  Vision Screening: Recommended annual ophthalmology exams for early detection of glaucoma and other disorders of the eye.  Dental Screening: Recommended annual dental exams for proper oral hygiene  Community Resource Referral / Chronic Care Management: CRR required this visit?   Did not ask questions/Due to language barrier.  CCM required this visit?  No     Plan:     I have personally reviewed and noted the following in the patient's chart:   Medical and social history Use of alcohol, tobacco or illicit drugs  Current medications and supplements including opioid prescriptions. Patient is not currently taking opioid prescriptions. Functional ability and status Nutritional status Physical activity Advanced directives List of other physicians Hospitalizations, surgeries, and ER visits in previous 12 months Vitals Screenings to include cognitive, depression, and falls Referrals and appointments  In addition, I have reviewed and discussed with patient certain preventive protocols, quality metrics, and best practice recommendations. A written personalized care plan for preventive services as well as general preventive health recommendations were provided to patient.  [name] L Zhoe Catania, CMA   06/06/2023   After Visit Summary: (MyChart) Due to this being a telephonic visit, the after visit summary with patients personalized plan was offered to patient via MyChart   Notes: Please refer to Routing Comments.

## 2023-06-06 NOTE — Patient Instructions (Signed)
 Mr. Bergerson , Thank you for taking time to come for your Medicare Wellness Visit. I appreciate your ongoing commitment to your health goals. Please review the following plan we discussed and let me know if I can assist you in the future.   Referrals/Orders/Follow-Ups/Clinician Recommendations: It was nice to speak with you today.    This is a list of the screening recommended for you and due dates:  Health Maintenance  Topic Date Due   Medicare Annual Wellness Visit  Never done   DTaP/Tdap/Td vaccine (2 - Td or Tdap) 01/23/2020   Pneumococcal Vaccination (2 of 2 - PPSV23) 09/07/2020   COVID-19 Vaccine (4 - 2024-25 season) 10/16/2022   Flu Shot  09/15/2023   Colon Cancer Screening  03/27/2024   Hepatitis C Screening  Completed   HIV Screening  Completed   Zoster (Shingles) Vaccine  Completed   HPV Vaccine  Aged Out   Meningitis B Vaccine  Aged Out    Advanced directives: (Declined) Advance directive discussed with you today. Even though you declined this today, please call our office should you change your mind, and we can give you the proper paperwork for you to fill out.  Next Medicare Annual Wellness Visit scheduled for next year: No

## 2023-06-14 DIAGNOSIS — I129 Hypertensive chronic kidney disease with stage 1 through stage 4 chronic kidney disease, or unspecified chronic kidney disease: Secondary | ICD-10-CM | POA: Diagnosis not present

## 2023-06-14 DIAGNOSIS — N186 End stage renal disease: Secondary | ICD-10-CM | POA: Diagnosis not present

## 2023-06-14 DIAGNOSIS — Z992 Dependence on renal dialysis: Secondary | ICD-10-CM | POA: Diagnosis not present

## 2023-06-15 ENCOUNTER — Encounter: Payer: Self-pay | Admitting: Emergency Medicine

## 2023-06-15 ENCOUNTER — Ambulatory Visit: Admitting: Emergency Medicine

## 2023-06-15 VITALS — BP 164/80 | HR 89 | Temp 98.6°F | Ht 69.0 in | Wt 240.0 lb

## 2023-06-15 DIAGNOSIS — R519 Headache, unspecified: Secondary | ICD-10-CM | POA: Diagnosis not present

## 2023-06-15 DIAGNOSIS — E669 Obesity, unspecified: Secondary | ICD-10-CM | POA: Diagnosis not present

## 2023-06-15 DIAGNOSIS — N186 End stage renal disease: Secondary | ICD-10-CM | POA: Diagnosis not present

## 2023-06-15 DIAGNOSIS — Z0000 Encounter for general adult medical examination without abnormal findings: Secondary | ICD-10-CM | POA: Diagnosis not present

## 2023-06-15 DIAGNOSIS — G8929 Other chronic pain: Secondary | ICD-10-CM

## 2023-06-15 DIAGNOSIS — Z992 Dependence on renal dialysis: Secondary | ICD-10-CM

## 2023-06-15 DIAGNOSIS — I110 Hypertensive heart disease with heart failure: Secondary | ICD-10-CM

## 2023-06-15 DIAGNOSIS — Z0001 Encounter for general adult medical examination with abnormal findings: Secondary | ICD-10-CM

## 2023-06-15 DIAGNOSIS — I5022 Chronic systolic (congestive) heart failure: Secondary | ICD-10-CM | POA: Diagnosis not present

## 2023-06-15 DIAGNOSIS — E785 Hyperlipidemia, unspecified: Secondary | ICD-10-CM

## 2023-06-15 NOTE — Assessment & Plan Note (Signed)
Stable.  Awaiting kidney transplant.

## 2023-06-15 NOTE — Assessment & Plan Note (Signed)
 Chronic and affecting quality of life Related to dialysis Acetaminophen  helps Pain management discussed Recommend neurology evaluation Referral placed today

## 2023-06-15 NOTE — Assessment & Plan Note (Signed)
 Diet and nutrition discussed.  Advised to decrease amount of daily carbohydrate intake and daily calories and increase amount of plant based protein in his diet

## 2023-06-15 NOTE — Assessment & Plan Note (Addendum)
 Poorly controlled.  Diet and nutrition discussed. Continue Vascepa  2 capsules twice a day along with atorvastatin  40 mg daily

## 2023-06-15 NOTE — Assessment & Plan Note (Addendum)
 Elevated blood pressure reading in the office today.   Clinically euvolemic.  No signs or symptoms of congestive heart failure. Stable. Continue amlodipine  5 mg daily, carvedilol  25 mg twice a day, hydralazine  25 mg every 8 hours and losartan 25 mg daily

## 2023-06-15 NOTE — Patient Instructions (Signed)
 ?????? ?????? ?????? Chronic Migraine Headache ?????? ?????? ??? ?? ??????? ???? ??? ?? ?? ???? ??? ????? ????? ?????? ????. ??? ????? ?? ?????? ???? ????? ?????? ??????? ?????? ?? ???? ?? ?????? ?? ???? ???? ?? ?????. ??? ???? ?????? ?????? ??????? ???? ??? ??????? ?????? ????????? ????? ???????? ???????. ??? ?????? ?????? ?????? ??? ???? ????? ???? 15 ????? ??? ????? ?? ????? ????? ???? ?? 3 ????. ???? ?? ???? ??????? ?????? ??????? ???? ?? ???? ????? (?????) ?????? ?????? ????. ?? ????? ??????? ?????? ?????? ???? ?????? ??? ?????. ???? ?? ???? ??? ?????? ?????? ???? ????? ?????? ???? ???????? ???? ???????? ???? ?????? ??????? ???????? ?????? ??? ?????. ?? ??????? ???? ???? ?? ???? ?????? ?????? ?? ?????:  ???????.  ???????? ??? ???? ??? ????? ?? ??? ????? ??? ????.  ??????? ?????????? ???? ????? ??? ????? ?? ???????? ?? ???????? ?? ???????? ????? ???????? (MSG) ?? ?????????.  ??? ??????? ?? ?????????? ??? ????? ????????? ?? ??????????? ?? ????????? ????????? ?? ????????. ????? ???? ???? ?? ???? ?????? ??????? ?????:  ?????.  ?????? ???????.  ????? ?????? ???? ?? ??? ????? ?? ??????.  ??????? (???????).  ??????? ??????? ?? ??????? ???????.  ?????? ?????? ?????.  ?????? ????? ??????????? ???????. ?? ????? ????? ?????? ?? ???? ??????? ??????? ?? ???????? ?????? ??????? ?????? ??????:  ??????? ??? ??? ??????? ?????? ?? ???? ????? ????? ??????? ?????? ?? ???????.  ??????? ?????? ?????? ??? ???????? ?? ?????.  ??????? ?? ????? ????????.  ???????? ?? ?????? ?? ?????.  ??????? ???? ????? ?? ?????? ?? ??????. ?? ?????? ??? ?????? ?? ???????? ????? ????? ?????? ?????? ?? ??? ????. ???? ????? ?? ???? ??? ???:  ???? ?????.  ???? ?????? ?????? ?? ?????. ??? ??? ???????? ?? ???? ????? ??? ????? ????? ??????? ?? ??? ????? ?? ?????.  ??? ????? ?? ???? ??????? ???????.  ????? ???? ?? ?????? ?????? ??????.  ????? ?? ??????? ?? ????? ?? ??????? ????.  ????? ???? ??? ?????? ????? ??????? ?? ???????  ???????? ?? ???????.  ????? ?? ??????? ?????. ??? ???????? ???? ??? ??? ?? ?????? ?????? ???? ???? ?????? ?? ????? ????? ?????? ?????? ??????. ??? ????? ??? ??????? ?????? ?? ???? ????? ??????? ?????? ?????? ?????? ?????? ????? ???:  ??????? ???? ????? ???? ??????? ?????.  ????? ??????. ??? ?? ????? ?? ????? ????????? ??? ?? ???:  ??? ???? ??????? ???????? ???????? (CT) ?? ?????? ?????????? (MRI). ??? ??? ?????? ????? ??? ??????? ??????? ?????? ??????.  ?????? ??? ???? ?? ?????? ??????? ?? ?????? ?????? (??? ????) ??????? (????? ?????? ??????? ???????? ?? ????? CSF).  ???? ????. ??? ?????? ??? ??????? ????? ????? ??????? ?????? ?????? ?????? ?????? ?????? ?? ???:  ????? ????? ????????.  ??????? ?? ?????? ??????. ???? ?? ???? ??????:  ????? ??????.  ??????? ?? ????? ?????? ??? ??????? ?? ????? ??????? ?? ???? ????.  ????? ??? ?????? ?? ???? ?????? (???????? ?????????) ???????? ??? ?????????.  ?????? ??????????? ??????? ???????? ??? ????? ??????? ??????? ???????? ???? (?????? ??????? ???????).  ??????? ???? ?????? ??????? ?????? ????????? ??? ???? ?? ???? ????? (???? ??????? ??????).  ????? ????? ?? ????? ????? ?? ?????.  ???????? ??? ?? ???? ?????? ?????? ??????. ???? ????????? ??????? ?? ??????: ???????  ?? ?????? ???????? ????? ???? ????? ????? ???? ?? ???? ???? ????? ??? ????? ???????? ???? ??????? ?????? ??????? ??.  ???? ???? ??????? ?????? ??????? ?? ??? ??? ??? ?????? ??????? ?? ????? ??? ???? ??????? ?? ??????? ??????. ??? ??????   ?? ?????? ????????? ???????.  ?? ?????? ??? ?????? ?????? ????? ??? ????????? ?? ????? ???????. ????? ??? ??????? ???? ????? ?????? ??????? ???????????? ??? ??????? ???????????. ????? ???? ??????? ?????? ??? ??? ????? ??? ???????? ??????? ?? ???????.  ???? ??? ?????  7-9 ????? ?? ????? ?? ???? ??????? ???? ???? ?? ????? ??????? ?????? ??????? ??.  ???? ?? ?????? ?????? ??? ?????? ??????? ??? ?????? ?????? ?? ?????? ?????? ?? ??????.  ???? ??? ???? ??  ?????? ??????. ???? ??? ?? ???? ??? ???????? ?? ????? ????? ?????? ???? ??????? ?????? ??????? ??.  ???? ???????? ???????. ???? ?????? ??? 150 ????? ?? ????? ?? ????????? ?????? ?????? ??? ????? ?? ???? ???????? ?? ??????? ?? 75 ????? ?? ??????? ??????. ??? ???????? ?????? ????? ?????? ???????? ???? ???? ???? ?? ?????? ?? ??????? ????????. ??????? ????  ????? ?????? ???? ???? ??????? ??????? ????? ?????? ????? ?????? ?????? ??? ????? ??????. **??? ???? ??????? ??? ?????: ? ??????? ?????????? ???? ????????. ? ??????? ???? ?????? ?? ?????. ? ???? ??????????? ?? ?????? ??????? ?? ???????.  ??? ?????? ????? ???? ????: ? ?????? ?? ???? ????? ??????. ? ???? ??? ????? ????? ??? ????. ? ??? ?? ???????? ??? ???? ??????? ??????? ????? ?? ???? ?? ??? ?????? ??????. ????? ?????? ??? ?????? ?? ?????????:  ????? ????? ???? ?????? ??????? ?????? (CHAMP?): [email or web address]?  ??????? ????????? ?????? ?????? ???????? (American Migraine Foundation?): ?[email or web address]?  ??????? ??????? ?????? (National Headache Foundation): [email or web address] ???? ?????? ??????? ?????? ?? ??????? ???????:  ??? ???? ????? ???? ?????? ???????.  ??????? ????? ?????? ?????? ???? ?????? ??????. ???? ???????? ????? ?? ??????? ???????:  ?????? ?????? ??????? ???? ???? ??????.  ?????? ???? ?? ???? ?? ?????.  ?????? ??????.  ?????? ???? ??????? ?? ????? ?? ?????? ???.  ?????? ??????? ?????? ?? ??????? ????? ?? ?????.  ?????? ???? ??? ??? ??????? ?? ????? ???????.  ??? ?????? ????? ???? ????? ???? ?????.  ??????? ????? ?????. ??? ????? ?? ??? ????????? ?? ???? ?????? ????????? ???? ?????? ???? ??????? ??????. ???? ?? ?????? ??? ????? ???? ?? ???? ?? ???? ??????? ??????.? Document Revised: 11/03/2021 Document Reviewed: 11/03/2021 Elsevier Patient Education  2024 [employer].

## 2023-06-15 NOTE — Progress Notes (Addendum)
 [name] 58 y.o.   Chief Complaint  Patient presents with   Annual Exam    HISTORY OF PRESENT ILLNESS: This is a 58 y.o. male here for annual exam Multiple chronic medical conditions On dialysis.  Awaiting kidney transplant. Today complaining of chronic headaches since 2007.  Needs referral for neurologist. History of poorly controlled dyslipidemia.  Last cardiologist assessment and plan as follows: ASSESSMENT: Secondary hypertriglyceridemia secondary to advanced kidney disease Progressive dyspnea on exertion, ? coronary equivalent ESRD-now on dialysis Low risk Myoview stress test with fixed inferior perfusion defect (02/2023) LVEF 55% with grade 2 diastolic dysfunction and pulmonary hypertension (02/2023)   PLAN: 1.   [name] continues to have poorly treated dyslipidemia.  He is tried a couple statins in the past and apparently had some myalgias but says he wants to try them again.  He also had been on Vascepa  but says that he wants a prescription for that again.  I have encouraged him not to try 2 medications at once but he said he knows how he felt on either medicine before and if he is feels no side effects again he would reach out to us .  Therefore we will prescribe both atorvastatin  and Vascepa .  If this is tolerated better then he can follow-up with us  and repeat lipids in about 6 months.  I reassured him based on his stress test and echo findings.  He apparently is undergoing workup for renal transplant but he says he has been listed for a long time.  There are notes indicating that his nephrologist may be [name] however he says he does not go to see her or [place] [facility].  I encouraged him to follow-up with his nephrologist or dialysis center physician.   Andrew Lites, MD, [facility], FACP  [name]  [facility]  Medical Director of the [facility]  Cardiovascular [facility] Reduction Clinic Diplomate of the American Board of Clinical Lipidology Attending  Cardiologist   HPI   Prior to Admission medications   Medication Sig Start Date End Date Taking? Authorizing Provider  acetaminophen  (TYLENOL ) 325 MG tablet Take 325 mg by mouth as needed.    [provider]  amLODipine  (NORVASC ) 5 MG tablet Take 1 tablet (5 mg total) by mouth daily. 04/18/19 11/07/21  Ezenduka, Nkeiruka J, MD  atorvastatin  (LIPITOR) 40 MG tablet Take 1 tablet (40 mg total) by mouth daily. 04/24/23 07/23/23  Hilty, Aviva Lemmings, MD  B Complex-C-Zn-Folic Acid  (DIALYVITE 800 WITH ZINC) 0.8 MG TABS Take 1 tablet by mouth daily. 06/22/20   [provider]  benzoyl peroxide  (BENZOYL PEROXIDE ) 5 % external liquid Apply topically 2 (two) times daily. 12/30/19   Andrew Hammersmith, MD  Betamethasone  Sodium Phosphate  6 MG/ML SOLN INJECT 2:4 SYRINGE BILATERALLY INTO [facility] KNEE JOINT 06/21/19   [provider]  calcium  acetate (PHOSLO ) 667 MG capsule Take 2 capsules (1,334 mg total) by mouth 3 (three) times daily with meals. 12/30/19   Andrew Hammersmith, MD  carvedilol  (COREG ) 25 MG tablet Take 1 tablet (25 mg total) by mouth 2 (two) times daily with a meal. 04/17/19 11/07/21  Ezenduka, Nkeiruka J, MD  hydrALAZINE  (APRESOLINE ) 25 MG tablet Take 1 tablet (25 mg total) by mouth every 8 (eight) hours. 04/17/19 11/07/21  Ezenduka, Nkeiruka J, MD  lidocaine -prilocaine (EMLA) cream Apply topically. 06/11/19   [provider]  losartan (COZAAR) 25 MG tablet Take 25 mg by mouth daily. 03/14/23   [provider]  pantoprazole  (  PROTONIX ) 40 MG tablet Take 1 tablet (40 mg total) by mouth daily. 04/17/19   Ezenduka, Nkeiruka J, MD  VASCEPA  1 g capsule Take 2 capsules (2 g total) by mouth 2 (two) times daily. 04/24/23   Hilty, Aviva Lemmings, MD    Allergies  Allergen Reactions   Lokelma  [Sodium Zirconium Cyclosilicate ] Shortness Of Breath   Beef-Derived Drug Products Other (See Comments)    Cultural preference   Pegademase Bovine Other (See Comments)    Cultural preference    Poractant Alfa Other (See Comments)    Cultural preference   Pork-Derived Products Other (See Comments)    Cultural preference    Patient Active Problem List   Diagnosis Date Noted   Chronic bilateral low back pain without sciatica 12/29/2021   Renovascular hypertension 11/07/2021   Allergy, unspecified, sequela 01/27/2020   Personal history of anaphylaxis 01/27/2020   History of nephrectomy, right 07/23/2019   Obesity (BMI 30-39.9) 07/23/2019   Pre-transplant evaluation for kidney transplant 07/23/2019   Hypertensive heart disease with chronic systolic congestive heart failure (HCC) 07/01/2019   Iron deficiency anemia, unspecified 04/25/2019   Anemia in chronic kidney disease 04/17/2019   End stage renal disease on dialysis (HCC) 04/17/2019   Secondary hyperparathyroidism of renal origin (HCC) 04/17/2019   DCM (dilated cardiomyopathy) (HCC)    CKD (chronic kidney disease) stage 5, GFR less than 15 ml/min (HCC) 04/11/2019   History of colon polyps 03/20/2019   Gastroesophageal reflux disease 03/20/2019   CKD (chronic kidney disease) stage 4, GFR 15-29 ml/min (HCC) 08/25/2018   CKD (chronic kidney disease) stage 3, GFR 30-59 ml/min (HCC) 05/10/2017   Renal insufficiency 11/15/2016   Testicular hypofunction 09/15/2015   BPH without obstruction/lower urinary tract symptoms 09/15/2015   Bilateral hearing loss 07/03/2015   Mixed hearing loss of right ear 07/03/2015   Dyslipidemia 03/29/2013   Nephrolithiasis 03/14/2013   Lumbar radiculopathy 02/26/2013   Vitamin D  deficiency 08/03/2011   HLD (hyperlipidemia) 04/10/2010    Past Medical History:  Diagnosis Date   Anemia    Back pain    CKD (chronic kidney disease), stage III (HCC)    Stage 4   Colon polyps    adenomatous   Elevated cholesterol    History of echocardiogram    Echo 11/17: EF 65-70, normal wall motion, grade 1 diastolic dysfunction, trivial MR, mild LAE, mild TR   Hyperlipidemia    Hypertension    no  current bp meds for last 3 months   Kidney stones 2007   Medication intolerance    a. multiple with prior nonadherence to regimen.   Nausea    " in the morning since having the kidney problem "   Otosclerosis of both ears    Pneumonia    PONV (postoperative nausea and vomiting)    PUD (peptic ulcer disease)    Has had unspecified surgery for this   Sleep apnea     Past Surgical History:  Procedure Laterality Date   AV FISTULA PLACEMENT Left 01/04/2019   Procedure: ARTERIOVENOUS (AV) FISTULA CREATION LEFT ARM;  Surgeon: Young Hensen, MD;  Location: [facility] OR;  Service: Vascular;  Laterality: Left;   BASCILIC VEIN TRANSPOSITION Left 03/11/2019   Procedure: BASILIC VEIN TRANSPOSITION 2ND STAGE LEFT;  Surgeon: Young Hensen, MD;  Location: [facility] OR;  Service: Vascular;  Laterality: Left;   NEPHRECTOMY  02/18/2011   Procedure: NEPHRECTOMY;  Surgeon: Jinny Mounts, MD;  Location: [facility] ORS;  Service: Urology;  Laterality: Right;  SMALL INTESTINE SURGERY     STAPEDOTOMY  2005   lt ear jan, right ear sept   surgery for ulcers  1990    Social History   Socioeconomic History   Marital status: Married    Spouse name: Salya   Number of children: 5   Years of education: Not on file   Highest education level: 12th grade  Occupational History    Employer: [employer]  Tobacco Use   Smoking status: Never   Smokeless tobacco: Never  Vaping Use   Vaping status: Never Used  Substance and Sexual Activity   Alcohol use: No   Drug use: No   Sexual activity: Yes    Birth control/protection: None  Other Topics Concern   Not on file  Social History Narrative   Lives at home with wife and family. He is from [place]. Came to the [place]  in [place].      Patient is right-handed. He lives in a one level home. He drinks 1-2 cups of coffee and tea a day. He does not exercise.   Social Drivers of Corporate investment banker Strain: Low Risk  (08/25/2018)   Overall Financial Resource Strain  (CARDIA)    Difficulty of Paying Living Expenses: Not hard at all  Food Insecurity: Low Risk  (11/24/2022)   Received from [facility]   Hunger Vital Sign    Worried About Running Out of Food in the Last Year: Never true    Ran Out of Food in the Last Year: Never true  Transportation Needs: No Transportation Needs (06/06/2023)   PRAPARE - Administrator, Civil Service (Medical): No    Lack of Transportation (Non-Medical): No  Physical Activity: Inactive (06/06/2023)   Exercise Vital Sign    Days of Exercise per Week: 0 days    Minutes of Exercise per Session: 0 min  Stress: No Stress Concern Present (08/25/2018)   [employer] of Occupational Health - Occupational Stress Questionnaire    Feeling of Stress : Not at all  Social Connections: Moderately Integrated (08/25/2018)   Social Connection and Isolation Panel [NHANES]    Frequency of Communication with Friends and Family: More than three times a week    Frequency of Social Gatherings with Friends and Family: More than three times a week    Attends Religious Services: More than 4 times per year    Active Member of [employer] or Organizations: No    Attends Banker Meetings: Never    Marital Status: Married  Catering manager Violence: Not At Risk (08/25/2018)   Humiliation, Afraid, Rape, and Kick questionnaire    Fear of Current or Ex-Partner: No    Emotionally Abused: No    Physically Abused: No    Sexually Abused: No    Family History  Problem Relation Age of Onset   Hyperlipidemia Father    Heart disease Mother    Hypertension Mother    Hypertension Sister    Heart disease Brother 9       CABG   Hyperlipidemia Sister    Hyperlipidemia Sister    Esophageal cancer Cousin    Healthy Child    Colon cancer Neg Hx    Stomach cancer Neg Hx    Rectal cancer Neg Hx      Review of Systems  Constitutional: Negative.  Negative for chills and fever.  HENT: Negative.  Negative for congestion and sore  throat.   Respiratory: Negative.  Negative for cough  and shortness of breath.   Cardiovascular: Negative.  Negative for chest pain and palpitations.  Gastrointestinal:  Negative for abdominal pain, nausea and vomiting.  Skin: Negative.  Negative for rash.  Neurological:  Positive for headaches. Negative for dizziness.    Today's Vitals   06/15/23 1257  BP: (!) 164/80  Pulse: 89  Temp: 98.6 F (37 C)  TempSrc: Oral  SpO2: 96%  Weight: 240 lb (108.9 kg)  Height: 5\' 9"  (1.753 m)   Body mass index is 35.44 kg/m.   Physical Exam HENT:     Head: Normocephalic.     Mouth/Throat:     Mouth: Mucous membranes are moist.     Pharynx: Oropharynx is clear.  Eyes:     Extraocular Movements: Extraocular movements intact.     Pupils: Pupils are equal, round, and reactive to light.  Cardiovascular:     Rate and Rhythm: Normal rate and regular rhythm.     Pulses: Normal pulses.     Heart sounds: Normal heart sounds.  Pulmonary:     Effort: Pulmonary effort is normal.     Breath sounds: Normal breath sounds.  Abdominal:     Palpations: Abdomen is soft.     Tenderness: There is no abdominal tenderness.  Skin:    General: Skin is warm and dry.  Neurological:     Mental Status: He is alert and oriented to person, place, and time.  Psychiatric:        Behavior: Behavior normal.      ASSESSMENT & PLAN: Problem List Items Addressed This Visit       Cardiovascular and Mediastinum   Hypertensive heart disease with chronic systolic congestive heart failure (HCC)   Elevated blood pressure reading in the office today.   Clinically euvolemic.  No signs or symptoms of congestive heart failure. Stable. Continue amlodipine  5 mg daily, carvedilol  25 mg twice a day, hydralazine  25 mg every 8 hours and losartan 25 mg daily        Genitourinary   End stage renal disease on dialysis (HCC)   Stable. Awaiting kidney transplant         Other   Dyslipidemia   Poorly controlled.  Diet  and nutrition discussed. Continue Vascepa  2 capsules twice a day along with atorvastatin  40 mg daily      Obesity (BMI 30-39.9)   Diet and nutrition discussed Advised to decrease amount of daily carbohydrate intake and daily calories and increase amount of plant-based protein in his diet      Chronic intractable headache   Chronic and affecting quality of life Related to dialysis Acetaminophen  helps Pain management discussed Recommend neurology evaluation Referral placed today      Relevant Orders   Ambulatory referral to Neurology   Other Visit Diagnoses       Encounter for general adult medical examination with abnormal findings    -  Primary      Modifiable risk factors discussed with patient. Anticipatory guidance according to age provided. The following topics were also discussed: Social Determinants of Health Smoking.  Non-smoker Diet and nutrition Benefits of exercise Cancer screening and review of colonoscopy report from 2021 Vaccinations reviewed recommendations Cardiovascular risk assessment The 10-year ASCVD risk score (Arnett DK, et al., 2019) is: 29%   Values used to calculate the score:     Age: 59 years     Sex: Male     Is Non-Hispanic African American: No     Diabetic: No  Tobacco smoker: No     Systolic Blood Pressure: 164 mmHg     Is BP treated: Yes     HDL Cholesterol: 29 mg/dL     Total Cholesterol: 313 mg/dL Review of multiple chronic medical conditions under management Review of all medications Mental health including depression and anxiety Fall and accident prevention  Patient Instructions  ?????? ?????? ?????? Chronic Migraine Headache ?????? ?????? ??? ?? ??????? ???? ??? ?? ?? ???? ??? ????? ????? ?????? ????. ??? ????? ?? ?????? ???? ????? ?????? ??????? ?????? ?? ???? ?? ?????? ?? ???? ???? ?? ?????. ??? ???? ?????? ?????? ??????? ???? ??? ??????? ?????? ????????? ????? ???????? ???????. ??? ?????? ?????? ?????? ??? ???? ?????  ???? 15 ????? ??? ????? ?? ????? ????? ???? ?? 3 ????. ???? ?? ???? ??????? ?????? ??????? ???? ?? ???? ????? (?????) ?????? ?????? ????. ?? ????? ??????? ?????? ?????? ???? ?????? ??? ?????. ???? ?? ???? ??? ?????? ?????? ???? ????? ?????? ???? ???????? ???? ???????? ???? ?????? ??????? ???????? ?????? ??? ?????. ?? ??????? ???? ???? ?? ???? ?????? ?????? ?? ?????:  ???????.  ???????? ??? ???? ??? ????? ?? ??? ????? ??? ????.  ??????? ?????????? ???? ????? ??? ????? ?? ???????? ?? ???????? ?? ???????? ????? ???????? (MSG) ?? ?????????.  ??? ??????? ?? ?????????? ??? ????? ????????? ?? ??????????? ?? ????????? ????????? ?? ????????. ????? ???? ???? ?? ???? ?????? ??????? ?????:  ?????.  ?????? ???????.  ????? ?????? ???? ?? ??? ????? ?? ??????.  ??????? (???????).  ??????? ??????? ?? ??????? ???????.  ?????? ?????? ?????.  ?????? ????? ??????????? ???????. ?? ????? ????? ?????? ?? ???? ??????? ??????? ?? ???????? ?????? ??????? ?????? ??????:  ??????? ??? ??? ??????? ?????? ?? ???? ????? ????? ??????? ?????? ?? ???????.  ??????? ?????? ?????? ??? ???????? ?? ?????.  ??????? ?? ????? ????????.  ???????? ?? ?????? ?? ?????.  ??????? ???? ????? ?? ?????? ?? ??????. ?? ?????? ??? ?????? ?? ???????? ????? ????? ?????? ?????? ?? ??? ????. ???? ????? ?? ???? ??? ???:  ???? ?????.  ???? ?????? ?????? ?? ?????. ??? ??? ???????? ?? ???? ????? ??? ????? ????? ??????? ?? ??? ????? ?? ?????.  ??? ????? ?? ???? ??????? ???????.  ????? ???? ?? ?????? ?????? ??????.  ????? ?? ??????? ?? ????? ?? ??????? ????.  ????? ???? ??? ?????? ????? ??????? ?? ??????? ???????? ?? ???????.  ????? ?? ??????? ?????. ??? ???????? ???? ??? ??? ?? ?????? ?????? ???? ???? ?????? ?? ????? ????? ?????? ?????? ??????. ??? ????? ??? ??????? ?????? ?? ???? ????? ??????? ?????? ?????? ?????? ?????? ????? ???:  ??????? ???? ????? ???? ??????? ?????.  ????? ??????. ??? ?? ????? ?? ????? ????????? ??? ?? ???:  ??? ????  ??????? ???????? ???????? (CT) ?? ?????? ?????????? (MRI). ??? ??? ?????? ????? ??? ??????? ??????? ?????? ??????.  ?????? ??? ???? ?? ?????? ??????? ?? ?????? ?????? (??? ????) ??????? (????? ?????? ??????? ???????? ?? ????? CSF).  ???? ????. ??? ?????? ??? ??????? ????? ????? ??????? ?????? ?????? ?????? ?????? ?????? ?? ???:  ????? ????? ????????.  ??????? ?? ?????? ??????. ???? ?? ???? ??????:  ????? ??????.  ??????? ?? ????? ?????? ??? ??????? ?? ????? ??????? ?? ???? ????.  ????? ??? ?????? ?? ???? ?????? (???????? ?????????) ???????? ??? ?????????.  ?????? ??????????? ??????? ???????? ??? ????? ??????? ??????? ???????? ???? (?????? ??????? ???????).  ??????? ???? ?????? ??????? ?????? ????????? ??? ???? ?? ???? ????? (???? ??????? ??????).  ????? ????? ?? ????? ????? ?? ?????.  ???????? ??? ?? ???? ?????? ?????? ??????. ???? ????????? ??????? ?? ??????: ???????  ?? ?????? ???????? ????? ???? ????? ????? ???? ?? ???? ???? ????? ??? ????? ???????? ???? ??????? ?????? ??????? ??.  ???? ???? ??????? ?????? ??????? ?? ??? ??? ??? ?????? ??????? ?? ????? ??? ???? ??????? ?? ??????? ??????. ??? ??????   ?? ?????? ????????? ???????.  ?? ?????? ??? ?????? ?????? ????? ??? ????????? ?? ????? ???????. ????? ??? ??????? ???? ????? ?????? ??????? ???????????? ??? ??????? ???????????. ????? ???? ??????? ?????? ??? ??? ????? ??? ???????? ??????? ?? ???????.  ???? ??? ?????  7-9 ????? ?? ????? ?? ???? ??????? ???? ???? ?? ????? ??????? ?????? ??????? ??.  ???? ?? ?????? ?????? ??? ?????? ??????? ??? ?????? ?????? ?? ?????? ?????? ?? ??????.  ???? ??? ???? ?? ?????? ??????. ???? ??? ?? ???? ??? ???????? ?? ????? ????? ?????? ???? ??????? ?????? ??????? ??.  ???? ???????? ???????. ???? ?????? ??? 150 ????? ?? ????? ?? ????????? ?????? ?????? ??? ????? ?? ???? ???????? ?? ??????? ?? 75 ????? ?? ??????? ??????. ??? ???????? ?????? ????? ?????? ???????? ???? ???? ???? ?? ?????? ?? ???????  ????????. ??????? ????  ????? ?????? ???? ???? ??????? ??????? ????? ?????? ????? ?????? ?????? ??? ????? ??????. **??? ???? ??????? ??? ?????: ? ??????? ?????????? ???? ????????. ? ??????? ???? ?????? ?? ?????. ? ???? ??????????? ?? ?????? ??????? ?? ???????.  ??? ?????? ????? ???? ????: ? ?????? ?? ???? ????? ??????. ? ???? ??? ????? ????? ??? ????. ? ??? ?? ???????? ??? ???? ??????? ??????? ????? ?? ???? ?? ??? ?????? ??????. ????? ?????? ??? ?????? ?? ?????????:  ????? ????? ???? ?????? ??????? ?????? (CHAMP?): [email or web address]?  ??????? ????????? ?????? ?????? ???????? (American Migraine Foundation?): ?[email or web address]?  ??????? ??????? ?????? (National Headache Foundation): [email or web address].org ???? ?????? ??????? ?????? ?? ??????? ???????:  ??? ???? ????? ???? ?????? ???????.  ??????? ????? ?????? ?????? ???? ?????? ??????. ???? ???????? ????? ?? ??????? ???????:  ?????? ?????? ??????? ???? ???? ??????.  ?????? ???? ?? ???? ?? ?????.  ?????? ??????.  ?????? ???? ??????? ?? ????? ?? ?????? ???.  ?????? ??????? ?????? ?? ??????? ????? ?? ?????.  ?????? ???? ??? ??? ??????? ?? ????? ???????.  ??? ?????? ????? ???? ????? ???? ?????.  ??????? ????? ?????. ??? ????? ?? ??? ????????? ?? ???? ?????? ????????? ???? ?????? ???? ??????? ??????. ???? ?? ?????? ??? ????? ???? ?? ???? ?? ???? ??????? ??????.? Document Revised: 11/03/2021 Document Reviewed: 11/03/2021 Elsevier Patient Education  2024 Elsevier Inc.     Maryagnes Small, MD [facility]

## 2023-06-22 ENCOUNTER — Other Ambulatory Visit: Payer: Self-pay

## 2023-06-22 ENCOUNTER — Ambulatory Visit (HOSPITAL_COMMUNITY)
Admission: RE | Admit: 2023-06-22 | Discharge: 2023-06-22 | Disposition: A | Discharge status: Home / Self Care | Attending: Nephrology | Admitting: Nephrology

## 2023-06-22 ENCOUNTER — Encounter (HOSPITAL_COMMUNITY)
Admission: RE | Disposition: A | Discharge status: Home / Self Care | Payer: Self-pay | Source: Home / Self Care | Attending: Nephrology

## 2023-06-22 DIAGNOSIS — T82858A Stenosis of vascular prosthetic devices, implants and grafts, initial encounter: Secondary | ICD-10-CM | POA: Insufficient documentation

## 2023-06-22 DIAGNOSIS — N250 Renal osteodystrophy: Secondary | ICD-10-CM | POA: Diagnosis not present

## 2023-06-22 DIAGNOSIS — Z79899 Other long term (current) drug therapy: Secondary | ICD-10-CM | POA: Insufficient documentation

## 2023-06-22 DIAGNOSIS — N186 End stage renal disease: Secondary | ICD-10-CM | POA: Diagnosis not present

## 2023-06-22 DIAGNOSIS — I120 Hypertensive chronic kidney disease with stage 5 chronic kidney disease or end stage renal disease: Secondary | ICD-10-CM | POA: Insufficient documentation

## 2023-06-22 DIAGNOSIS — D631 Anemia in chronic kidney disease: Secondary | ICD-10-CM | POA: Insufficient documentation

## 2023-06-22 DIAGNOSIS — E785 Hyperlipidemia, unspecified: Secondary | ICD-10-CM | POA: Diagnosis not present

## 2023-06-22 DIAGNOSIS — Y832 Surgical operation with anastomosis, bypass or graft as the cause of abnormal reaction of the patient, or of later complication, without mention of misadventure at the time of the procedure: Secondary | ICD-10-CM | POA: Insufficient documentation

## 2023-06-22 DIAGNOSIS — Z992 Dependence on renal dialysis: Secondary | ICD-10-CM | POA: Diagnosis not present

## 2023-06-22 DIAGNOSIS — I871 Compression of vein: Secondary | ICD-10-CM | POA: Diagnosis not present

## 2023-06-22 HISTORY — PX: A/V FISTULAGRAM: CATH118298

## 2023-06-22 SURGERY — A/V FISTULAGRAM
Anesthesia: LOCAL

## 2023-06-22 MED ORDER — LIDOCAINE HCL (PF) 1 % IJ SOLN
INTRAMUSCULAR | Status: AC
  Filled 2023-06-22: qty 30

## 2023-06-22 MED ORDER — IODIXANOL 320 MG/ML IV SOLN
INTRAVENOUS | Status: DC | PRN
  Administered 2023-06-22: 16 mL via INTRAVENOUS

## 2023-06-22 MED ORDER — LIDOCAINE HCL (PF) 1 % IJ SOLN
INTRAMUSCULAR | Status: DC | PRN
  Administered 2023-06-22: 2 mL

## 2023-06-22 SURGICAL SUPPLY — 4 items
BAG SNAP BAND KOVER 36X36 (MISCELLANEOUS) ×1 IMPLANT
COVER DOME SNAP 22 D (MISCELLANEOUS) ×1 IMPLANT
SHEATH PROBE COVER 6X72 (BAG) ×1 IMPLANT
TRAY PV CATH (CUSTOM PROCEDURE TRAY) ×1 IMPLANT

## 2023-06-22 NOTE — H&P (Addendum)
 Chief Complaint: Decreased flows  Interval H&P  The patient has presented today for an angiogram/ angioplasty.  Various methods of treatment have been discussed with the patient.  After consideration of risk, benefits and other options for treatment, the patient has consented to a angiogram/ angioplasty with  possible stent placement.   Risks of angiogram with potential angioplasty and stenting if needed.contrast reaction, extravasation/ bleeding, dissection, hypotension and death were explained to the patient.  The patient's history has been reviewed and the patient has been examined, no changes in status.  Stable for angiogram/angioplasty  I have reviewed the patient's chart and labs.  Questions were answered to the patient's satisfaction.  Assessment/Plan: ESRD dialyzing MWF Decreased access flows in a left upper arm brachiobasilic fistula placed January 04, 2019 and transposed on March 11, 2019.- planning on angiogram with possibly angioplasty.  On examination the fistula has a very nice thrill along the inflow with no real hyper pulsatile segments.  Patient is insisting on a procedure; I actually tried to talk him out of the procedure because I think it is going to be normal. Renal osteodystrophy - continue binders per home regimen. Anemia - managed with ESA's and IV iron at dialysis center. HTN - resume home regimen.   HPI: [name] is an 58 y.o. male Struve hyperlipidemia, hypertension, peptic ulcer disease, ESRD here for prolonged bleeding and decreased flows in a left upper arm brachiobasilic fistula placed January 04, 2019 and transposed on March 11, 2019.  ROS Per HPI.  Chemistry and CBC: Creat  Date/Time Value Ref Range Status  12/09/2015 01:28 PM 1.53 (H) 0.70 - 1.33 mg/dL Final    Comment:      For patients > or = 58 years of age: The upper reference limit for Creatinine is approximately 13% higher for people identified as African-American.     09/18/2014  04:16 PM 1.39 (H) 0.60 - 1.35 mg/dL Final  62/13/0865 78:46 AM 1.21 0.50 - 1.35 mg/dL Final  96/29/5284 13:24 PM 1.35 0.50 - 1.35 mg/dL Final  40/11/2723 36:64 PM 1.14 0.50 - 1.35 mg/dL Final  40/34/7425 95:63 PM 1.14 0.50 - 1.35 mg/dL Final  87/56/4332 95:18 PM 1.15 0.50 - 1.35 mg/dL Final  84/16/6063 01:60 AM 1.13 0.50 - 1.35 mg/dL Final  10/93/2355 73:22 PM 1.16 0.50 - 1.35 mg/dL Final   Creatinine, Ser  Date/Time Value Ref Range Status  11/09/2021 10:18 AM 9.45 (H) 0.61 - 1.24 mg/dL Final    Comment:    RESULTS VERIFIED BY REPEAT TESTING  11/08/2021 08:53 AM 13.93 (H) 0.61 - 1.24 mg/dL Final  02/54/2706 23:76 PM 13.21 (H) 0.61 - 1.24 mg/dL Final  28/31/5176 16:07 PM 11.48 (H) 0.61 - 1.24 mg/dL Final  37/11/6267 48:54 PM 7.62 (HH) 0.40 - 1.50 mg/dL Final  62/70/3500 93:81 PM 12.23 (H) 0.61 - 1.24 mg/dL Final  82/99/3716 96:78 PM 8.86 (H) 0.76 - 1.27 mg/dL Final  93/81/0175 10:25 AM 7.14 (H) 0.61 - 1.24 mg/dL Final  85/27/7824 23:53 AM 6.08 (H) 0.61 - 1.24 mg/dL Final  61/44/3154 00:86 AM 7.42 (H) 0.61 - 1.24 mg/dL Final  76/19/5093 26:71 AM 6.72 (H) 0.61 - 1.24 mg/dL Final  24/58/0998 33:82 AM 8.56 (H) 0.61 - 1.24 mg/dL Final  50/53/9767 34:19 AM 7.91 (H) 0.61 - 1.24 mg/dL Final  37/90/2409 73:53 PM 7.98 (H) 0.61 - 1.24 mg/dL Final  29/92/4268 34:19 PM 7.96 (H) 0.61 - 1.24 mg/dL Final  62/22/9798 92:11 AM 8.90 (H) 0.61 - 1.24 mg/dL Final  94/17/4081 44:81  AM 8.50 (H) 0.61 - 1.24 mg/dL Final  38/75/6433 29:51 AM 5.43 (HH) 0.76 - 1.27 mg/dL Final    Comment:                      Client Requested Flag  09/12/2018 05:19 PM 4.34 (HH) 0.76 - 1.27 mg/dL Final    Comment:                      Client Requested Flag  08/27/2018 04:21 AM 4.15 (H) 0.61 - 1.24 mg/dL Final  88/41/6606 30:16 PM 4.10 (H) 0.61 - 1.24 mg/dL Final  02/22/3233 57:32 AM 3.77 (H) 0.61 - 1.24 mg/dL Final  20/25/4270 62:37 PM 3.80 (H) 0.61 - 1.24 mg/dL Final  62/83/1517 61:60 PM 3.70 (H) 0.61 - 1.24 mg/dL Final   73/71/0626 94:85 PM 3.55 (H) 0.76 - 1.27 mg/dL Final  46/27/0350 09:38 AM 1.81 (H) 0.76 - 1.27 mg/dL Final  18/29/9371 69:67 PM 1.86 (H) 0.76 - 1.27 mg/dL Final  89/38/1017 51:02 AM 1.62 (H) 0.76 - 1.27 mg/dL Final  58/52/7782 42:35 PM 1.83 (H) 0.76 - 1.27 mg/dL Final  36/14/4315 40:08 PM 1.33 (H) 0.76 - 1.27 mg/dL Final  67/61/9509 32:67 PM 1.50 (H) 0.61 - 1.24 mg/dL Final  12/45/8099 83:38 PM 1.47 0.40 - 1.50 mg/dL Final  25/06/3974 73:41 AM 1.3 0.4 - 1.5 mg/dL Final  93/79/0240 97:35 PM 1.3 0.4 - 1.5 mg/dL Final  32/99/2426 83:41 PM 1.17 0.50 - 1.35 mg/dL Final  96/22/2979 89:21 PM 1.26 0.50 - 1.35 mg/dL Final  19/41/7408 14:48 AM 1.34 0.50 - 1.35 mg/dL Final  18/56/3149 70:26 PM 1.13 0.50 - 1.35 mg/dL Final  37/85/8850 27:74 AM 1.08 0.50 - 1.35 mg/dL Final  12/87/8676 72:09 AM 1.00 0.4 - 1.5 mg/dL Final  47/10/6281 66:29 PM 1.27 0.4 - 1.5 mg/dL Final   No results for input(s): "NA", "K", "CL", "CO2", "GLUCOSE", "BUN", "CREATININE", "CALCIUM ", "PHOS" in the last 168 hours.  Invalid input(s): "ALB" No results for input(s): "WBC", "NEUTROABS", "HGB", "HCT", "MCV", "PLT" in the last 168 hours. Liver Function Tests: No results for input(s): "AST", "ALT", "ALKPHOS", "BILITOT", "PROT", "ALBUMIN" in the last 168 hours. No results for input(s): "LIPASE", "AMYLASE" in the last 168 hours. No results for input(s): "AMMONIA" in the last 168 hours. Cardiac Enzymes: No results for input(s): "CKTOTAL", "CKMB", "CKMBINDEX", "TROPONINI" in the last 168 hours. Iron Studies: No results for input(s): "IRON", "TIBC", "TRANSFERRIN", "FERRITIN" in the last 72 hours. PT/INR: @LABRCNTIP (inr:5)  Xrays/Other Studies: )No results found for this or any previous visit (from the past 48 hours). No results found.  PMH:   Past Medical History:  Diagnosis Date   Anemia    Back pain    CKD (chronic kidney disease), stage III (HCC)    Stage 4   Colon polyps    adenomatous   Elevated cholesterol     History of echocardiogram    Echo 11/17: EF 65-70, normal wall motion, grade 1 diastolic dysfunction, trivial MR, mild LAE, mild TR   Hyperlipidemia    Hypertension    no current bp meds for last 3 months   Kidney stones 2007   Medication intolerance    a. multiple with prior nonadherence to regimen.   Nausea    " in the morning since having the kidney problem "   Otosclerosis of both ears    Pneumonia    PONV (postoperative nausea and vomiting)    PUD (peptic ulcer disease)  Has had unspecified surgery for this   Sleep apnea     PSH:   Past Surgical History:  Procedure Laterality Date   AV FISTULA PLACEMENT Left 01/04/2019   Procedure: ARTERIOVENOUS (AV) FISTULA CREATION LEFT ARM;  Surgeon: Young Hensen, MD;  Location: [facility] OR;  Service: Vascular;  Laterality: Left;   BASCILIC VEIN TRANSPOSITION Left 03/11/2019   Procedure: BASILIC VEIN TRANSPOSITION 2ND STAGE LEFT;  Surgeon: Young Hensen, MD;  Location: [facility] OR;  Service: Vascular;  Laterality: Left;   NEPHRECTOMY  02/18/2011   Procedure: NEPHRECTOMY;  Surgeon: Jinny Mounts, MD;  Location: [facility] ORS;  Service: Urology;  Laterality: Right;   SMALL INTESTINE SURGERY     STAPEDOTOMY  2005   lt ear jan, right ear sept   surgery for ulcers  1990    Allergies:  Allergies  Allergen Reactions   Lokelma  [Sodium Zirconium Cyclosilicate ] Shortness Of Breath   Beef-Derived Drug Products Other (See Comments)    Cultural preference   Pegademase Bovine Other (See Comments)    Cultural preference   Poractant Alfa Other (See Comments)    Cultural preference   Pork-Derived Products Other (See Comments)    Cultural preference    Medications:   Prior to Admission medications   Medication Sig Start Date End Date Taking? Authorizing Provider  acetaminophen  (TYLENOL ) 325 MG tablet Take 325 mg by mouth as needed.    [provider]  amLODipine  (NORVASC ) 5 MG tablet Take 1 tablet (5 mg total) by mouth daily. 04/18/19  06/15/23  Ezenduka, Nkeiruka J, MD  atorvastatin  (LIPITOR) 40 MG tablet Take 1 tablet (40 mg total) by mouth daily. 04/24/23 07/23/23  Hilty, Aviva Lemmings, MD  B Complex-C-Zn-Folic Acid  (DIALYVITE 800 WITH ZINC) 0.8 MG TABS Take 1 tablet by mouth daily. Patient not taking: Reported on 06/15/2023 06/22/20   [provider]  benzoyl peroxide  (BENZOYL PEROXIDE ) 5 % external liquid Apply topically 2 (two) times daily. Patient not taking: Reported on 06/15/2023 12/30/19   Elvira Hammersmith, MD  Betamethasone  Sodium Phosphate  6 MG/ML SOLN INJECT 2:4 SYRINGE BILATERALLY INTO [facility] KNEE JOINT Patient not taking: Reported on 06/15/2023 06/21/19   [provider]  calcium  acetate (PHOSLO ) 667 MG capsule Take 2 capsules (1,334 mg total) by mouth 3 (three) times daily with meals. Patient not taking: Reported on 06/15/2023 12/30/19   Elvira Hammersmith, MD  carvedilol  (COREG ) 25 MG tablet Take 1 tablet (25 mg total) by mouth 2 (two) times daily with a meal. 04/17/19 06/15/23  Veronica Gordon, MD  hydrALAZINE  (APRESOLINE ) 25 MG tablet Take 1 tablet (25 mg total) by mouth every 8 (eight) hours. 04/17/19 06/15/23  Ezenduka, Nkeiruka J, MD  lidocaine -prilocaine (EMLA) cream Apply topically. Patient not taking: Reported on 06/15/2023 06/11/19   [provider]  losartan (COZAAR) 25 MG tablet Take 25 mg by mouth daily. 03/14/23   [provider]  pantoprazole  (PROTONIX ) 40 MG tablet Take 1 tablet (40 mg total) by mouth daily. Patient not taking: Reported on 06/15/2023 04/17/19   Ezenduka, Nkeiruka J, MD  VASCEPA  1 g capsule Take 2 capsules (2 g total) by mouth 2 (two) times daily. 04/24/23   Hazle Lites, MD    Discontinued Meds:  There are no discontinued medications.  Social History:  reports that he has never smoked. He has never used smokeless tobacco. He reports that he does not drink alcohol and does not use drugs.  Family History:   Family History  Problem Relation  Age of Onset    Hyperlipidemia Father    Heart disease Mother    Hypertension Mother    Hypertension Sister    Heart disease Brother 22       CABG   Hyperlipidemia Sister    Hyperlipidemia Sister    Esophageal cancer Cousin    Healthy Child    Colon cancer Neg Hx    Stomach cancer Neg Hx    Rectal cancer Neg Hx     There were no vitals taken for this visit. GEN: NAD, A&Ox3, NCAT HEENT: No conjunctival pallor, EOMI NECK: Supple, no thyromegaly LUNGS: CTA B/L no rales, rhonchi or wheezing CV: RRR, No M/R/G ABD: SNDNT +BS  EXT: No lower extremity edema ACCESS: Left brachiobasilic fistula thrill along the inflow as well       Patrick Boor, MD 06/22/2023, 9:39 AM

## 2023-06-22 NOTE — Op Note (Signed)
 Prolonged bleeding in a left  brachiobasilic fistula being referred for a yellow light on the machine.  Access is not hyper pulsatile at any segment and I actually tried to convince the patient not to have a procedure but he insisted on checking to see if it was abnormal.     Summary:  1)      The patient had a fistulogram with a large calibered basilic fistula, patent outflow swing, axillary vein and centrals.   2)      Inflow also visualized anastomosis and inflow vein were patent.  Of note flows are very rapid.   3)      This right  BBT remains amenable to future percutaneous intervention as long as it remains patent at least 3 months.   Description of procedure: The arm was prepped and draped in the usual sterile fashion. The right upper arm brachial basilic fistula was cannulated (42353) with an 18G Angiocath needle directed in an antegrade direction in arterial limb of the fistula.  Contrast [phone]) injection via the IV. The angiogram of the fistula (15400) showed a caliber body of the basilic vein, patent outflow swing segment, large calibered axillary and patent centrals as well.    Retrograde arteriogram revealed a patent inflow basilic vein and anastomosis as well; of note flows were very rapid through the fistula.     Hemostasis: Focal  manual pressure was placed at the cannulation site on removal of the IV.   Sedation: None   Contrast. 16 mL   Monitoring: Because of the patient's comorbid conditions and sedation during the procedure, continuous EKG monitoring and O2 saturation monitoring was performed throughout the procedure by the RN. There were no abnormal arrhythmias encountered.   Complications: None   Diagnoses: I87.1 Stricture of vein  N18.6 ESRD T82.858A Stricture of access   Procedure Coding:  36901 Cannulation and angiogram of fistula Q9967 Contrast   Recommendations:  1. Continue to cannulate the fistula with 15G needles.  2. Refer for problems with  flows/swelling. 3. Remove the suture next treatment.    Discharge: The patient was discharged home in stable condition. The patient was given education regarding the care of the dialysis access AVF and specific instructions in case of any problems.

## 2023-06-22 NOTE — Discharge Instructions (Signed)

## 2023-06-23 ENCOUNTER — Encounter (HOSPITAL_COMMUNITY): Payer: Self-pay | Admitting: Nephrology

## 2023-06-30 DIAGNOSIS — Z114 Encounter for screening for human immunodeficiency virus [HIV]: Secondary | ICD-10-CM | POA: Diagnosis not present

## 2023-06-30 DIAGNOSIS — Z125 Encounter for screening for malignant neoplasm of prostate: Secondary | ICD-10-CM | POA: Diagnosis not present

## 2023-06-30 DIAGNOSIS — Z01818 Encounter for other preprocedural examination: Secondary | ICD-10-CM | POA: Diagnosis not present

## 2023-06-30 DIAGNOSIS — Z1159 Encounter for screening for other viral diseases: Secondary | ICD-10-CM | POA: Diagnosis not present

## 2023-07-13 DIAGNOSIS — G4486 Cervicogenic headache: Secondary | ICD-10-CM | POA: Diagnosis not present

## 2023-07-13 DIAGNOSIS — G43719 Chronic migraine without aura, intractable, without status migrainosus: Secondary | ICD-10-CM | POA: Diagnosis not present

## 2023-07-13 DIAGNOSIS — G4489 Other headache syndrome: Secondary | ICD-10-CM | POA: Diagnosis not present

## 2023-07-13 DIAGNOSIS — R519 Headache, unspecified: Secondary | ICD-10-CM | POA: Diagnosis not present

## 2023-07-15 DIAGNOSIS — Z992 Dependence on renal dialysis: Secondary | ICD-10-CM | POA: Diagnosis not present

## 2023-07-15 DIAGNOSIS — N186 End stage renal disease: Secondary | ICD-10-CM | POA: Diagnosis not present

## 2023-07-15 DIAGNOSIS — I129 Hypertensive chronic kidney disease with stage 1 through stage 4 chronic kidney disease, or unspecified chronic kidney disease: Secondary | ICD-10-CM | POA: Diagnosis not present

## 2023-07-18 ENCOUNTER — Encounter: Payer: Self-pay | Admitting: Neurology

## 2023-07-29 DIAGNOSIS — G43719 Chronic migraine without aura, intractable, without status migrainosus: Secondary | ICD-10-CM | POA: Diagnosis not present

## 2023-07-29 DIAGNOSIS — R519 Headache, unspecified: Secondary | ICD-10-CM | POA: Diagnosis not present

## 2023-07-29 DIAGNOSIS — G4486 Cervicogenic headache: Secondary | ICD-10-CM | POA: Diagnosis not present

## 2023-08-08 DIAGNOSIS — M5382 Other specified dorsopathies, cervical region: Secondary | ICD-10-CM | POA: Diagnosis not present

## 2023-08-08 DIAGNOSIS — G4486 Cervicogenic headache: Secondary | ICD-10-CM | POA: Diagnosis not present

## 2023-08-08 DIAGNOSIS — M546 Pain in thoracic spine: Secondary | ICD-10-CM | POA: Diagnosis not present

## 2023-08-08 DIAGNOSIS — M542 Cervicalgia: Secondary | ICD-10-CM | POA: Diagnosis not present

## 2023-08-08 DIAGNOSIS — G8929 Other chronic pain: Secondary | ICD-10-CM | POA: Diagnosis not present

## 2023-08-08 DIAGNOSIS — M545 Low back pain, unspecified: Secondary | ICD-10-CM | POA: Diagnosis not present

## 2023-08-08 DIAGNOSIS — G43719 Chronic migraine without aura, intractable, without status migrainosus: Secondary | ICD-10-CM | POA: Diagnosis not present

## 2023-08-13 DIAGNOSIS — Z8616 Personal history of COVID-19: Secondary | ICD-10-CM | POA: Diagnosis not present

## 2023-08-13 DIAGNOSIS — E785 Hyperlipidemia, unspecified: Secondary | ICD-10-CM | POA: Diagnosis not present

## 2023-08-13 DIAGNOSIS — Z5982 Transportation insecurity: Secondary | ICD-10-CM | POA: Diagnosis not present

## 2023-08-13 DIAGNOSIS — M62838 Other muscle spasm: Secondary | ICD-10-CM | POA: Diagnosis not present

## 2023-08-13 DIAGNOSIS — N186 End stage renal disease: Secondary | ICD-10-CM | POA: Diagnosis not present

## 2023-08-13 DIAGNOSIS — N2581 Secondary hyperparathyroidism of renal origin: Secondary | ICD-10-CM | POA: Diagnosis not present

## 2023-08-13 DIAGNOSIS — I509 Heart failure, unspecified: Secondary | ICD-10-CM | POA: Diagnosis not present

## 2023-08-13 DIAGNOSIS — Z6836 Body mass index (BMI) 36.0-36.9, adult: Secondary | ICD-10-CM | POA: Diagnosis not present

## 2023-08-13 DIAGNOSIS — Z833 Family history of diabetes mellitus: Secondary | ICD-10-CM | POA: Diagnosis not present

## 2023-08-13 DIAGNOSIS — Z5919 Other inadequate housing: Secondary | ICD-10-CM | POA: Diagnosis not present

## 2023-08-13 DIAGNOSIS — J302 Other seasonal allergic rhinitis: Secondary | ICD-10-CM | POA: Diagnosis not present

## 2023-08-14 DIAGNOSIS — I129 Hypertensive chronic kidney disease with stage 1 through stage 4 chronic kidney disease, or unspecified chronic kidney disease: Secondary | ICD-10-CM | POA: Diagnosis not present

## 2023-08-14 DIAGNOSIS — J069 Acute upper respiratory infection, unspecified: Secondary | ICD-10-CM | POA: Diagnosis not present

## 2023-08-28 DIAGNOSIS — H9213 Otorrhea, bilateral: Secondary | ICD-10-CM | POA: Diagnosis not present

## 2023-08-28 DIAGNOSIS — R0982 Postnasal drip: Secondary | ICD-10-CM | POA: Diagnosis not present

## 2023-08-29 ENCOUNTER — Encounter (INDEPENDENT_AMBULATORY_CARE_PROVIDER_SITE_OTHER): Payer: Self-pay

## 2023-09-12 DIAGNOSIS — N186 End stage renal disease: Secondary | ICD-10-CM | POA: Diagnosis not present

## 2023-09-12 DIAGNOSIS — I120 Hypertensive chronic kidney disease with stage 5 chronic kidney disease or end stage renal disease: Secondary | ICD-10-CM | POA: Diagnosis not present

## 2023-09-12 DIAGNOSIS — D631 Anemia in chronic kidney disease: Secondary | ICD-10-CM | POA: Diagnosis not present

## 2023-09-12 DIAGNOSIS — M5416 Radiculopathy, lumbar region: Secondary | ICD-10-CM | POA: Diagnosis not present

## 2023-09-12 DIAGNOSIS — G4700 Insomnia, unspecified: Secondary | ICD-10-CM | POA: Diagnosis not present

## 2023-09-12 DIAGNOSIS — M542 Cervicalgia: Secondary | ICD-10-CM | POA: Diagnosis not present

## 2023-09-12 DIAGNOSIS — G8929 Other chronic pain: Secondary | ICD-10-CM | POA: Diagnosis not present

## 2023-09-12 DIAGNOSIS — R053 Chronic cough: Secondary | ICD-10-CM | POA: Diagnosis not present

## 2023-09-12 DIAGNOSIS — Z992 Dependence on renal dialysis: Secondary | ICD-10-CM | POA: Diagnosis not present

## 2023-09-12 DIAGNOSIS — M546 Pain in thoracic spine: Secondary | ICD-10-CM | POA: Diagnosis not present

## 2023-09-14 DIAGNOSIS — Z992 Dependence on renal dialysis: Secondary | ICD-10-CM | POA: Diagnosis not present

## 2023-09-14 DIAGNOSIS — I129 Hypertensive chronic kidney disease with stage 1 through stage 4 chronic kidney disease, or unspecified chronic kidney disease: Secondary | ICD-10-CM | POA: Diagnosis not present

## 2023-09-14 DIAGNOSIS — N186 End stage renal disease: Secondary | ICD-10-CM | POA: Diagnosis not present

## 2023-09-18 ENCOUNTER — Encounter (HOSPITAL_COMMUNITY): Payer: Self-pay

## 2023-09-19 DIAGNOSIS — M546 Pain in thoracic spine: Secondary | ICD-10-CM | POA: Diagnosis not present

## 2023-09-19 DIAGNOSIS — M5382 Other specified dorsopathies, cervical region: Secondary | ICD-10-CM | POA: Diagnosis not present

## 2023-09-19 DIAGNOSIS — D631 Anemia in chronic kidney disease: Secondary | ICD-10-CM | POA: Diagnosis not present

## 2023-09-19 DIAGNOSIS — G43719 Chronic migraine without aura, intractable, without status migrainosus: Secondary | ICD-10-CM | POA: Diagnosis not present

## 2023-09-19 DIAGNOSIS — I10 Essential (primary) hypertension: Secondary | ICD-10-CM | POA: Diagnosis not present

## 2023-09-19 DIAGNOSIS — G8929 Other chronic pain: Secondary | ICD-10-CM | POA: Diagnosis not present

## 2023-09-19 DIAGNOSIS — G4486 Cervicogenic headache: Secondary | ICD-10-CM | POA: Diagnosis not present

## 2023-09-19 DIAGNOSIS — M545 Low back pain, unspecified: Secondary | ICD-10-CM | POA: Diagnosis not present

## 2023-09-19 DIAGNOSIS — N186 End stage renal disease: Secondary | ICD-10-CM | POA: Diagnosis not present

## 2023-09-19 DIAGNOSIS — Z992 Dependence on renal dialysis: Secondary | ICD-10-CM | POA: Diagnosis not present

## 2023-09-19 DIAGNOSIS — M542 Cervicalgia: Secondary | ICD-10-CM | POA: Diagnosis not present

## 2023-09-27 DIAGNOSIS — M9901 Segmental and somatic dysfunction of cervical region: Secondary | ICD-10-CM | POA: Diagnosis not present

## 2023-09-27 DIAGNOSIS — M9905 Segmental and somatic dysfunction of pelvic region: Secondary | ICD-10-CM | POA: Diagnosis not present

## 2023-09-27 DIAGNOSIS — M9902 Segmental and somatic dysfunction of thoracic region: Secondary | ICD-10-CM | POA: Diagnosis not present

## 2023-09-27 DIAGNOSIS — M5451 Vertebrogenic low back pain: Secondary | ICD-10-CM | POA: Diagnosis not present

## 2023-09-27 DIAGNOSIS — M9903 Segmental and somatic dysfunction of lumbar region: Secondary | ICD-10-CM | POA: Diagnosis not present

## 2023-10-10 ENCOUNTER — Encounter (HOSPITAL_COMMUNITY): Payer: Self-pay

## 2023-10-11 ENCOUNTER — Institutional Professional Consult (permissible substitution) (INDEPENDENT_AMBULATORY_CARE_PROVIDER_SITE_OTHER): Admitting: Physician Assistant

## 2023-10-15 DIAGNOSIS — N186 End stage renal disease: Secondary | ICD-10-CM | POA: Diagnosis not present

## 2023-10-15 DIAGNOSIS — Z992 Dependence on renal dialysis: Secondary | ICD-10-CM | POA: Diagnosis not present

## 2023-10-15 DIAGNOSIS — I129 Hypertensive chronic kidney disease with stage 1 through stage 4 chronic kidney disease, or unspecified chronic kidney disease: Secondary | ICD-10-CM | POA: Diagnosis not present

## 2023-10-24 DIAGNOSIS — R053 Chronic cough: Secondary | ICD-10-CM | POA: Diagnosis not present

## 2023-10-26 ENCOUNTER — Encounter: Payer: Self-pay | Admitting: Internal Medicine

## 2023-10-26 ENCOUNTER — Institutional Professional Consult (permissible substitution) (INDEPENDENT_AMBULATORY_CARE_PROVIDER_SITE_OTHER): Admitting: Physician Assistant

## 2023-10-31 ENCOUNTER — Ambulatory Visit: Admitting: Gastroenterology

## 2023-11-06 ENCOUNTER — Ambulatory Visit (INDEPENDENT_AMBULATORY_CARE_PROVIDER_SITE_OTHER)

## 2023-11-06 ENCOUNTER — Encounter (HOSPITAL_COMMUNITY): Payer: Self-pay

## 2023-11-06 ENCOUNTER — Ambulatory Visit (INDEPENDENT_AMBULATORY_CARE_PROVIDER_SITE_OTHER): Admitting: Podiatry

## 2023-11-06 VITALS — Temp 97.8°F | Ht 69.0 in | Wt 235.9 lb

## 2023-11-06 DIAGNOSIS — M722 Plantar fascial fibromatosis: Secondary | ICD-10-CM | POA: Diagnosis not present

## 2023-11-06 NOTE — Progress Notes (Signed)
 Chief Complaint  Patient presents with   Foot Pain    Pt here for bilateral heel pain x 1 yr says is affecting his hip and back. Not diabetic. Dialysis. lipitor    HPI: 58 y.o. male presenting today as a reestablish new patient for evaluation of wear and tear on his sneakers to the lateral aspect of the bilateral heels.  He denies any foot pain  Past Medical History:  Diagnosis Date   Anemia    Back pain    CKD (chronic kidney disease), stage III (HCC)    Stage 4   Colon polyps    adenomatous   Elevated cholesterol    History of echocardiogram    Echo 11/17: EF 65-70, normal wall motion, grade 1 diastolic dysfunction, trivial MR, mild LAE, mild TR   Hyperlipidemia    Hypertension    no current bp meds for last 3 months   Kidney stones 2007   Medication intolerance    a. multiple with prior nonadherence to regimen.   Nausea     in the morning since having the kidney problem    Otosclerosis of both ears    Pneumonia    PONV (postoperative nausea and vomiting)    PUD (peptic ulcer disease)    Has had unspecified surgery for this   Sleep apnea     Past Surgical History:  Procedure Laterality Date   A/V FISTULAGRAM N/A 06/22/2023   Procedure: A/V Fistulagram;  Surgeon: Melia Lynwood ORN, MD;  Location: [facility] INVASIVE CV LAB;  Service: Cardiovascular;  Laterality: N/A;   AV FISTULA PLACEMENT Left 01/04/2019   Procedure: ARTERIOVENOUS (AV) FISTULA CREATION LEFT ARM;  Surgeon: Gretta Lonni PARAS, MD;  Location: [facility] OR;  Service: Vascular;  Laterality: Left;   BASCILIC VEIN TRANSPOSITION Left 03/11/2019   Procedure: BASILIC VEIN TRANSPOSITION 2ND STAGE LEFT;  Surgeon: Gretta Lonni PARAS, MD;  Location: [facility] OR;  Service: Vascular;  Laterality: Left;   NEPHRECTOMY  02/18/2011   Procedure: NEPHRECTOMY;  Surgeon: Thomasine Oiler, MD;  Location: [facility] ORS;  Service: Urology;  Laterality: Right;   SMALL INTESTINE SURGERY     STAPEDOTOMY  2005   lt ear jan, right ear sept   surgery for  ulcers  1990    Allergies  Allergen Reactions   Lokelma  [Sodium Zirconium Cyclosilicate ] Shortness Of Breath   Beef-Derived Drug Products Other (See Comments)    Cultural preference   Pegademase Bovine Other (See Comments)    Cultural preference   Poractant Alfa Other (See Comments)    Cultural preference   Pork-Derived Products Other (See Comments)    Cultural preference     Physical Exam: General: The patient is alert and oriented x3 in no acute distress.  Dermatology: Skin is warm, dry and supple bilateral lower extremities.   Vascular: Palpable pedal pulses bilaterally. Capillary refill within normal limits.  No appreciable edema.  No erythema.  Neurological: Grossly intact via light touch  Musculoskeletal Exam: No pedal deformities noted.  Muscle strength 5/5 all compartments.  No pain or tenderness with palpation throughout the foot  Radiographic Exam B/L feet 11/06/2023:  Normal osseous mineralization. Joint spaces preserved.  No fractures or osseous irregularities noted.  Impression: Negative  Assessment/Plan of Care: 1.  Normal foot and gait pattern; normal exam  -Patient evaluated.  X-rays reviewed -I explained to the patient that wearing on the lateral aspect of the bilateral heels in his tennis shoes is normal wear and tear.  This is a normal  gait pattern -Patient has no foot pain and no loss of muscle strength.  Normal foot exam -Return to clinic PRN       Thresa EMERSON Sar, DPM [facility]  Dr. Thresa EMERSON Sar, DPM    [address] [place], [place] [address]                Office [phone]  Fax [phone]

## 2023-11-07 DIAGNOSIS — R0602 Shortness of breath: Secondary | ICD-10-CM | POA: Diagnosis not present

## 2023-11-08 ENCOUNTER — Encounter (HOSPITAL_COMMUNITY): Payer: Self-pay

## 2023-11-10 ENCOUNTER — Encounter (HOSPITAL_COMMUNITY): Payer: Self-pay

## 2023-11-10 ENCOUNTER — Emergency Department (HOSPITAL_COMMUNITY)

## 2023-11-10 ENCOUNTER — Emergency Department (HOSPITAL_COMMUNITY): Admission: EM | Admit: 2023-11-10 | Discharge: 2023-11-10 | Disposition: A | Discharge status: Home / Self Care

## 2023-11-10 ENCOUNTER — Other Ambulatory Visit: Payer: Self-pay

## 2023-11-10 DIAGNOSIS — N186 End stage renal disease: Secondary | ICD-10-CM | POA: Insufficient documentation

## 2023-11-10 DIAGNOSIS — R252 Cramp and spasm: Secondary | ICD-10-CM | POA: Diagnosis not present

## 2023-11-10 DIAGNOSIS — I959 Hypotension, unspecified: Secondary | ICD-10-CM | POA: Diagnosis not present

## 2023-11-10 DIAGNOSIS — R5381 Other malaise: Secondary | ICD-10-CM | POA: Insufficient documentation

## 2023-11-10 DIAGNOSIS — I120 Hypertensive chronic kidney disease with stage 5 chronic kidney disease or end stage renal disease: Secondary | ICD-10-CM | POA: Insufficient documentation

## 2023-11-10 DIAGNOSIS — R42 Dizziness and giddiness: Secondary | ICD-10-CM | POA: Insufficient documentation

## 2023-11-10 DIAGNOSIS — Z743 Need for continuous supervision: Secondary | ICD-10-CM | POA: Diagnosis not present

## 2023-11-10 DIAGNOSIS — Z992 Dependence on renal dialysis: Secondary | ICD-10-CM | POA: Insufficient documentation

## 2023-11-10 DIAGNOSIS — I517 Cardiomegaly: Secondary | ICD-10-CM | POA: Diagnosis not present

## 2023-11-10 DIAGNOSIS — Z79899 Other long term (current) drug therapy: Secondary | ICD-10-CM | POA: Diagnosis not present

## 2023-11-10 DIAGNOSIS — R531 Weakness: Secondary | ICD-10-CM | POA: Insufficient documentation

## 2023-11-10 LAB — CBC
HCT: 40.4 % (ref 39.0–52.0)
Hemoglobin: 13.1 g/dL (ref 13.0–17.0)
MCH: 29 pg (ref 26.0–34.0)
MCHC: 32.4 g/dL (ref 30.0–36.0)
MCV: 89.6 fL (ref 80.0–100.0)
Platelets: 208 K/uL (ref 150–400)
RBC: 4.51 MIL/uL (ref 4.22–5.81)
RDW: 15.3 % (ref 11.5–15.5)
WBC: 8.1 K/uL (ref 4.0–10.5)
nRBC: 0 % (ref 0.0–0.2)

## 2023-11-10 LAB — BASIC METABOLIC PANEL WITH GFR
Anion gap: 16 — ABNORMAL HIGH (ref 5–15)
BUN: 21 mg/dL — ABNORMAL HIGH (ref 6–20)
CO2: 28 mmol/L (ref 22–32)
Calcium: 9.3 mg/dL (ref 8.9–10.3)
Chloride: 90 mmol/L — ABNORMAL LOW (ref 98–111)
Creatinine, Ser: 6.52 mg/dL — ABNORMAL HIGH (ref 0.61–1.24)
GFR, Estimated: 9 mL/min — ABNORMAL LOW (ref >60)
Glucose, Bld: 91 mg/dL (ref 70–99)
Potassium: 4.4 mmol/L (ref 3.5–5.1)
Sodium: 134 mmol/L — ABNORMAL LOW (ref 135–145)

## 2023-11-10 LAB — MAGNESIUM: Magnesium: 2.1 mg/dL (ref 1.7–2.4)

## 2023-11-10 MED ORDER — LACTATED RINGERS IV BOLUS
500.0000 mL | Freq: Once | INTRAVENOUS | Status: AC
  Administered 2023-11-10: 500 mL via INTRAVENOUS

## 2023-11-10 NOTE — ED Provider Notes (Signed)
 [place] EMERGENCY DEPARTMENT AT [facility] Provider Note   CSN: [id number] Arrival date & time: 11/10/23  1243     Patient presents with: Hypotension   [name] is a 58 y.o. male.   58 year old presenting emergency department with lightheadedness and generalized malaise/weakness.  ESRD, had full session of dialysis.  Went to sleep woke up with lower extremity cramping. Felt weak and lightheaded. EMS reports hypotension. He continues to feel weak and lightheaded. No chest pain, no shortness of breath no abdominal pain nausea vomiting.        Prior to Admission medications   Medication Sig Start Date End Date Taking? Authorizing Provider  acetaminophen  (TYLENOL ) 325 MG tablet Take 325 mg by mouth as needed.    [provider]  amLODipine  (NORVASC ) 5 MG tablet Take 1 tablet (5 mg total) by mouth daily. 04/18/19 11/06/23  Ezenduka, Nkeiruka J, MD  atorvastatin  (LIPITOR) 40 MG tablet Take 1 tablet (40 mg total) by mouth daily. 04/24/23 11/06/23  Hilty, Vinie BROCKS, MD  B Complex-C-Zn-Folic Acid  (DIALYVITE 800 WITH ZINC) 0.8 MG TABS Take 1 tablet by mouth daily. 06/22/20   [provider]  benzoyl peroxide  (BENZOYL PEROXIDE ) 5 % external liquid Apply topically 2 (two) times daily. 12/30/19   Purcell Emil Schanz, MD  Betamethasone  Sodium Phosphate  6 MG/ML SOLN INJECT 2:4 SYRINGE BILATERALLY INTO [facility] KNEE JOINT 06/21/19   [provider]  calcium  acetate (PHOSLO ) 667 MG capsule Take 2 capsules (1,334 mg total) by mouth 3 (three) times daily with meals. 12/30/19   Purcell Emil Schanz, MD  carvedilol  (COREG ) 25 MG tablet Take 1 tablet (25 mg total) by mouth 2 (two) times daily with a meal. 04/17/19 11/06/23  Ezenduka, Nkeiruka J, MD  hydrALAZINE  (APRESOLINE ) 25 MG tablet Take 1 tablet (25 mg total) by mouth every 8 (eight) hours. 04/17/19 11/06/23  Ezenduka, Nkeiruka J, MD  lidocaine -prilocaine (EMLA) cream Apply topically. 06/11/19   [provider]   losartan (COZAAR) 25 MG tablet Take 25 mg by mouth daily. 03/14/23   [provider]  pantoprazole  (PROTONIX ) 40 MG tablet Take 1 tablet (40 mg total) by mouth daily. 04/17/19   Ezenduka, Nkeiruka J, MD  VASCEPA  1 g capsule Take 2 capsules (2 g total) by mouth 2 (two) times daily. 04/24/23   Hilty, Vinie BROCKS, MD    Allergies: Lokelma  [sodium zirconium cyclosilicate ], Beef-derived drug products, Pegademase bovine, Poractant alfa, and Pork-derived products    Review of Systems  Updated Vital Signs BP 100/67   Pulse 77   Temp 98.4 F (36.9 C) (Oral)   Resp 16   Ht 5' 9 (1.753 m)   Wt 107 kg   SpO2 100%   BMI 34.84 kg/m   Physical Exam Vitals and nursing note reviewed.  Constitutional:      General: He is not in acute distress.    Appearance: He is obese. He is not toxic-appearing.  HENT:     Nose: Nose normal.     Mouth/Throat:     Mouth: Mucous membranes are moist.  Eyes:     Conjunctiva/sclera: Conjunctivae normal.  Cardiovascular:     Rate and Rhythm: Normal rate and regular rhythm.  Pulmonary:     Effort: Pulmonary effort is normal.  Abdominal:     General: Abdomen is flat. There is no distension.     Palpations: Abdomen is soft.     Tenderness: There is no abdominal tenderness. There is no guarding or rebound.  Musculoskeletal:  Comments: Left upper extremity fistula in place.  Thrill noted.  Skin:    General: Skin is warm and dry.     Capillary Refill: Capillary refill takes less than 2 seconds.  Neurological:     Mental Status: He is alert and oriented to person, place, and time.  Psychiatric:        Mood and Affect: Mood normal.        Behavior: Behavior normal.     (all labs ordered are listed, but only abnormal results are displayed) Labs Reviewed  CBC  BASIC METABOLIC PANEL WITH GFR  MAGNESIUM     EKG: EKG Interpretation Date/Time:  Friday November 10 2023 13:17:33 EDT Ventricular Rate:  77 PR Interval:  53 QRS Duration:  100 QT  Interval:  404 QTC Calculation: 458 R Axis:   -1  Text Interpretation: Sinus rhythm Short PR interval Minimal ST depression, lateral leads Confirmed by Neysa Clap [phone]) on 11/10/2023 1:39:14 PM  Radiology: DG Chest Portable 1 View Result Date: 11/10/2023 EXAM: 1 VIEW(S) XRAY OF THE CHEST 11/10/2023 01:40:00 PM COMPARISON: None available. CLINICAL HISTORY: hypotension. Per triage notes: Pt bib GC ems from dialysis. Pt finished treatment and noticed systolic bp was in the 60's. EMS arrival was 80/P. C/o weakness. MWF, left side access FINDINGS: LUNGS AND PLEURA: Linear opacity at left lung base likely represents atelectasis or scarring. No pulmonary edema. No pleural effusion. No pneumothorax. HEART AND MEDIASTINUM: Mild cardiomegaly. BONES AND SOFT TISSUES: No acute osseous abnormality. IMPRESSION: 1. No acute cardiopulmonary process. 2. Mild cardiomegaly is present without acute findings. Electronically signed by: Waddell Calk MD 11/10/2023 02:14 PM EDT RP Workstation: [id number]     Procedures   Medications Ordered in the ED  lactated ringers  bolus 500 mL (has no administration in time range)    Clinical Course as of 11/10/23 1610  Fri Nov 10, 2023  1338 Echo in 2021 with EF 35-40% [TY]  1608 Lab work largely reassuring no leukocytosis to suggest systemic infection.  No anemia.  BMP consistent with ESRD.  Potassium normal.  Magnesium  normal.  EKG appears to be normal sinus rhythm.  Blood pressure soft and in low 100s.  I suspect they pulled off too much fluid during dialysis.  Plan for IV fluids and reevaluation. [TY]    Clinical Course User Index [TY] Neysa Clap PARAS, DO                                 Medical Decision Making This is a 58 year old male with ESRD on dialysis, obesity, hypertension, hyperlipidemia presents to the emergency department for lightheadedness and generalized malaise.  EMS reported hypotension on their arrival with a systolic blood pressure in the 60s,  improved 80.  Initial blood pressure here 114/76.  Does not appear to be in obvious distress on physical exam.  No focal neurodeficits.  Lungs clear.  Will get screening labs, ECG and IV fluids.    Care signed out to afternoon team; dispo pending labs and re-evaluation after IVFs.   Amount and/or Complexity of Data Reviewed Independent Historian: EMS    Details: Reported hypotension on their arrival systolic blood pressure 60s to 80s External Data Reviewed:     Details: See ED course. Labs: ordered. Decision-making details documented in ED Course. Radiology: ordered and independent interpretation performed. Decision-making details documented in ED Course. ECG/medicine tests: ordered and independent interpretation performed. Decision-making details documented in ED Course.  Risk  Decision regarding hospitalization. Diagnosis or treatment significantly limited by social determinants of health. Risk Details: English as second language      Final diagnoses:  None    ED Discharge Orders     None          Neysa Caron PARAS, DO 11/10/23 1533

## 2023-11-10 NOTE — ED Triage Notes (Signed)
 Pt bib GC ems from dialysis. Pt finished treatment and noticed systolic bp was in the 60's. EMS arrival was 80/P. C/o weakness. MWF, left side access.

## 2023-11-10 NOTE — Discharge Instructions (Signed)
Make an appointment to follow-up with your primary care doctor within the next few days.  Return to the emergency room if you have any worsening symptoms.

## 2023-11-10 NOTE — ED Provider Notes (Signed)
 Care was taken over from Dr. Neysa.  Patient presented from dialysis feeling weak with some low blood pressures.  He was waiting small fluid bolus.  His labs are nonconcerning.  He does not have a fever.  Does not have any chest pain or shortness of breath.  Other than feeling fatigue, no other symptoms.  He says he typically gets like this after dialysis but today was a little bit worse.  His blood pressures been a little low in the 90s and 100s systolic.  He did get 500 cc of fluid.  He was able to eat and drink without symptoms.  He was able to ambulate without dizziness or significant symptoms.  His blood pressure has improved.  His last 1 is 106/72.  Reviewed his records and on his last office visit with his transplant team, his blood pressure was similar and actually a little bit lower.  Suspect this is related to postdialysis.  He was discharged home in good condition.  Return precautions were given.   Lenor Hollering, MD 11/10/23 2024

## 2023-11-10 NOTE — ED Notes (Signed)
 Patient encouraged to stay in bed. Patient still unhooked himself and walked to restroom anyway.

## 2023-11-10 NOTE — ED Notes (Signed)
 Pt was given a sandwich bag and ginger ale, and graham crackers.

## 2023-11-14 DIAGNOSIS — I129 Hypertensive chronic kidney disease with stage 1 through stage 4 chronic kidney disease, or unspecified chronic kidney disease: Secondary | ICD-10-CM | POA: Diagnosis not present

## 2023-11-14 DIAGNOSIS — N186 End stage renal disease: Secondary | ICD-10-CM | POA: Diagnosis not present

## 2023-11-14 DIAGNOSIS — Z992 Dependence on renal dialysis: Secondary | ICD-10-CM | POA: Diagnosis not present

## 2023-11-21 DIAGNOSIS — R430 Anosmia: Secondary | ICD-10-CM | POA: Diagnosis not present

## 2023-11-21 DIAGNOSIS — J45991 Cough variant asthma: Secondary | ICD-10-CM | POA: Diagnosis not present

## 2023-11-21 DIAGNOSIS — Z8616 Personal history of COVID-19: Secondary | ICD-10-CM | POA: Diagnosis not present

## 2023-11-21 DIAGNOSIS — J310 Chronic rhinitis: Secondary | ICD-10-CM | POA: Diagnosis not present

## 2023-11-22 ENCOUNTER — Encounter (HOSPITAL_COMMUNITY): Payer: Self-pay

## 2023-11-29 ENCOUNTER — Ambulatory Visit: Admitting: Neurology

## 2023-11-29 NOTE — Progress Notes (Deleted)
 NEUROLOGY CONSULTATION NOTE  [name] MRN: [id number] DOB: 1965-11-01  Referring provider: Emil Aloysius Schaumann, MD Primary care provider: Emil Aloysius Schaumann, MD  Reason for consult:  headaches  Assessment/Plan:   ***   Subjective:  [name] is a 58 year old right-handed man with HTN, ESRD on HD (M/W/F) s/p nephrectomy, HLD, PUD, and OSA who presents for headache.  History supplemented by referring provider note.  MRI of bran from 2019 and CT C-spine from 2022 personally reviewed.  He developed headaches since he was 58 years old when he hit the left parietal region of his head.  They started getting worse around 2007, when he developed longstanding history of uncontrolled hypertension (170s/100s).  Over the years, he had been taking analgesics almost daily.  He described a moderate to severe pressure headache of various locations (unilateral either side, across forehead, across back of head) that was persistent and occurred almost daily.  No associated nausea, vomiting, photophobia, phonophobia, visual disturbance, autonomic symptoms or unilateral numbness or weakness.  No known triggers or relieving factors.    Headaches subsequently improved in 2020 in which they were more moderate and occurring 2 to 3 times a week.  He also reported a history of OSA although he was not on a CPAP.  Sleep study?    He had CKD and developed ESRD.  Once he started HD, headaches improved.  After several months, he started having headaches only associated with HD.  Moderate to severe diffuse throbbing headache starting behind the eyes.  Associated with nausea, photophobia and phonophobia and neck pain.  The headaches start 3 hours into his 4 hour HD.  I saw him in April 2022.  I provided him samples of Nurtec to take right before starting HD.  I have also recommended that he discuss with his dialysis physician whether they can change something in the solvent or rate of his hemodialysis and to make sure that  his blood pressure remains as stable as possible.  Imaging: 01/18/2018 MRI BRAIN WO:  Negative MRI head without contrast. 04/15/2020 CT C-SPINE WO:  Reversal of the normal cervical lordosis centered at the C5 level likely due to positioning and degenerative changes.  Mild multilevel degenerative changes of the spine most prominent at the C5-C6 level. No severe central canal or neural foraminal osseous stenosis. No acute fracture. No aggressive appearing focal osseous lesion or focal pathologic process. 07/29/2023 MRI BRAIN WO:  No evidence of acute infarct. No mass effect. No evidence of acute hemorrhage. No hydrocephalus. No significant white matter disease for the patient's age. Normal appearance of the major intracranial arteries and dural venous sinuses.   Current medications: Current NSAIDS:  Contraindicated. Current analgesics:  Tylenol  (previously effective) Current triptans:  none Current ergotamine:  none Current anti-emetic:  none Current muscle relaxants:  none Current anti-anxiolytic:  none Current sleep aide:  none Current Antihypertensive medications:  losartan Current Antidepressant medications:  none Current Anticonvulsant medications:  none Current anti-CGRP:  none Current Vitamins/Herbal/Supplements:  Dialyvite 800mg  with Zinc Current Antihistamines/Decongestants:  none Other therapy:  none Hormone/birth control:  none  Past medications: Past NSAIDS:  Ibuprofen , Mobic, Relafen Past analgesics:  Tramadol , Tylenol  Past abortive triptans:  sumatriptan  50mg  tab Past abortive ergotamine:  none Past muscle relaxants:  Flexeril , Robaxin  Past anti-emetic:  none Past antihypertensive medications:  Atenolol , carvedilol , hydralazine , amlodipine , furosemide  Past antidepressant medications:  none Past anticonvulsant medications:  Gabapentin, Lyrica  Past anti-CGRP:  none Past vitamins/Herbal/Supplements:  none Past antihistamines/decongestants:  Zyrtec , Flonase   Other past  therapies:  none  Social history: Caffeine :  1-2 cups coffee daily Diet:  Hydrates.  No soda Exercise:  no Depression:  no; Anxiety:  no Other pain:  Diffuse body aches Sleep hygiene:  Poor.  Reported sleep apnea.  Does not use CPAP.  Family history of headache:   no     PAST MEDICAL HISTORY: Past Medical History:  Diagnosis Date   Anemia    Back pain    CKD (chronic kidney disease), stage III (HCC)    Stage 4   Colon polyps    adenomatous   Elevated cholesterol    History of echocardiogram    Echo 11/17: EF 65-70, normal wall motion, grade 1 diastolic dysfunction, trivial MR, mild LAE, mild TR   Hyperlipidemia    Hypertension    no current bp meds for last 3 months   Kidney stones 2007   Medication intolerance    a. multiple with prior nonadherence to regimen.   Nausea     in the morning since having the kidney problem    Otosclerosis of both ears    Pneumonia    PONV (postoperative nausea and vomiting)    PUD (peptic ulcer disease)    Has had unspecified surgery for this   Sleep apnea     PAST SURGICAL HISTORY: Past Surgical History:  Procedure Laterality Date   A/V FISTULAGRAM N/A 06/22/2023   Procedure: A/V Fistulagram;  Surgeon: Melia Lynwood ORN, MD;  Location: [facility];  Service: Cardiovascular;  Laterality: N/A;   AV FISTULA PLACEMENT Left 01/04/2019   Procedure: ARTERIOVENOUS (AV) FISTULA CREATION LEFT ARM;  Surgeon: Gretta Lonni PARAS, MD;  Location: [facility] OR;  Service: Vascular;  Laterality: Left;   BASCILIC VEIN TRANSPOSITION Left 03/11/2019   Procedure: BASILIC VEIN TRANSPOSITION 2ND STAGE LEFT;  Surgeon: Gretta Lonni PARAS, MD;  Location: [facility] OR;  Service: Vascular;  Laterality: Left;   NEPHRECTOMY  02/18/2011   Procedure: NEPHRECTOMY;  Surgeon: Thomasine Oiler, MD;  Location: [facility] ORS;  Service: Urology;  Laterality: Right;   SMALL INTESTINE SURGERY     STAPEDOTOMY  2005   lt ear jan, right ear sept   surgery for ulcers  1990     MEDICATIONS: Current Outpatient Medications on File Prior to Visit  Medication Sig Dispense Refill   acetaminophen  (TYLENOL ) 325 MG tablet Take 325 mg by mouth as needed.     amLODipine  (NORVASC ) 5 MG tablet Take 1 tablet (5 mg total) by mouth daily. 30 tablet 0   atorvastatin  (LIPITOR) 40 MG tablet Take 1 tablet (40 mg total) by mouth daily. 90 tablet 3   B Complex-C-Zn-Folic Acid  (DIALYVITE 800 WITH ZINC) 0.8 MG TABS Take 1 tablet by mouth daily.     benzoyl peroxide  (BENZOYL PEROXIDE ) 5 % external liquid Apply topically 2 (two) times daily. 142 g 12   Betamethasone  Sodium Phosphate  6 MG/ML SOLN INJECT 2:4 SYRINGE BILATERALLY INTO EACH KNEE JOINT     calcium  acetate (PHOSLO ) 667 MG capsule Take 2 capsules (1,334 mg total) by mouth 3 (three) times daily with meals. 180 capsule 3   carvedilol  (COREG ) 25 MG tablet Take 1 tablet (25 mg total) by mouth 2 (two) times daily with a meal. 60 tablet 0   hydrALAZINE  (APRESOLINE ) 25 MG tablet Take 1 tablet (25 mg total) by mouth every 8 (eight) hours. 90 tablet 0   lidocaine -prilocaine (EMLA) cream Apply topically.     losartan (COZAAR) 25 MG tablet Take 25 mg  by mouth daily.     pantoprazole  (PROTONIX ) 40 MG tablet Take 1 tablet (40 mg total) by mouth daily. 30 tablet 5   VASCEPA  1 g capsule Take 2 capsules (2 g total) by mouth 2 (two) times daily. 360 capsule 3   No current facility-administered medications on file prior to visit.    ALLERGIES: Allergies  Allergen Reactions   Lokelma  [Sodium Zirconium Cyclosilicate ] Shortness Of Breath   Bovine (Beef) Protein-Containing Drug Products Other (See Comments)    Cultural preference   Pegademase Bovine Other (See Comments)    Cultural preference   Poractant Alfa Other (See Comments)    Cultural preference   Porcine (Pork) Protein-Containing Drug Products Other (See Comments)    Cultural preference    FAMILY HISTORY: Family History  Problem Relation Age of Onset   Hyperlipidemia Father     Heart disease Mother    Hypertension Mother    Hypertension Sister    Heart disease Brother 81       CABG   Hyperlipidemia Sister    Hyperlipidemia Sister    Esophageal cancer Cousin    Healthy Child    Colon cancer Neg Hx    Stomach cancer Neg Hx    Rectal cancer Neg Hx     Objective:  *** General: No acute distress.  Patient appears well-groomed.   Head:  Normocephalic/atraumatic Eyes:  fundi examined but not visualized Neck: supple, no paraspinal tenderness, full range of motion Heart: regular rate and rhythm Neurological Exam: Mental status: alert and oriented to person, place, and time, speech fluent and not dysarthric, language intact. Cranial nerves: CN I: not tested CN II: pupils equal, round and reactive to light, visual fields intact CN III, IV, VI:  full range of motion, no nystagmus, no ptosis CN V: facial sensation intact. CN VII: upper and lower face symmetric CN VIII: hearing intact CN IX, X: gag intact, uvula midline CN XI: sternocleidomastoid and trapezius muscles intact CN XII: tongue midline Bulk & Tone: normal, no fasciculations. Motor:  muscle strength 5/5 throughout Sensation:  Pinprick and vibratory sensation intact. Deep Tendon Reflexes:  2+ throughout,  toes downgoing.   Finger to nose testing:  Without dysmetria.   Gait:  Normal station and stride.  Romberg negative.    Thank you for allowing me to take part in the care of this patient.  Juliene Dunnings, DO  CC: ***

## 2023-12-15 DIAGNOSIS — Z992 Dependence on renal dialysis: Secondary | ICD-10-CM | POA: Diagnosis not present

## 2023-12-15 DIAGNOSIS — I129 Hypertensive chronic kidney disease with stage 1 through stage 4 chronic kidney disease, or unspecified chronic kidney disease: Secondary | ICD-10-CM | POA: Diagnosis not present

## 2023-12-15 DIAGNOSIS — N186 End stage renal disease: Secondary | ICD-10-CM | POA: Diagnosis not present

## 2024-01-10 ENCOUNTER — Ambulatory Visit (INDEPENDENT_AMBULATORY_CARE_PROVIDER_SITE_OTHER): Admitting: Emergency Medicine

## 2024-01-10 ENCOUNTER — Encounter: Payer: Self-pay | Admitting: Emergency Medicine

## 2024-01-10 VITALS — Ht 69.0 in | Wt 247.0 lb

## 2024-01-10 DIAGNOSIS — N186 End stage renal disease: Secondary | ICD-10-CM | POA: Diagnosis not present

## 2024-01-10 DIAGNOSIS — Z992 Dependence on renal dialysis: Secondary | ICD-10-CM

## 2024-01-10 DIAGNOSIS — I150 Renovascular hypertension: Secondary | ICD-10-CM

## 2024-01-10 NOTE — Progress Notes (Signed)
 [name] 58 y.o.   Chief Complaint  Patient presents with   Hypertension    HISTORY OF PRESENT ILLNESS: This is a 58 y.o. male here for evaluation of low blood pressure readings at home especially postdialysis Waiting for kidney transplant No other complaints or medical concerns today Today utilizing Rena as an interpreter from [employer]. BP Readings from Last 3 Encounters:  11/10/23 106/72  06/22/23 (!) 174/92  06/15/23 (!) 164/80     Hypertension Pertinent negatives include no chest pain, palpitations or shortness of breath.     Prior to Admission medications   Medication Sig Start Date End Date Taking? Authorizing Provider  acetaminophen  (TYLENOL ) 325 MG tablet Take 325 mg by mouth as needed.    [provider]  amLODipine  (NORVASC ) 5 MG tablet Take 1 tablet (5 mg total) by mouth daily. 04/18/19 11/06/23  Donnamarie Lebron PARAS, MD  atorvastatin  (LIPITOR) 40 MG tablet Take 1 tablet (40 mg total) by mouth daily. 04/24/23 11/06/23  Hilty, Vinie BROCKS, MD  B Complex-C-Zn-Folic Acid  (DIALYVITE 800 WITH ZINC) 0.8 MG TABS Take 1 tablet by mouth daily. 06/22/20   [provider]  benzoyl peroxide  (BENZOYL PEROXIDE ) 5 % external liquid Apply topically 2 (two) times daily. 12/30/19   Purcell Emil Schanz, MD  Betamethasone  Sodium Phosphate  6 MG/ML SOLN INJECT 2:4 SYRINGE BILATERALLY INTO [facility] KNEE JOINT 06/21/19   [provider]  calcium  acetate (PHOSLO ) 667 MG capsule Take 2 capsules (1,334 mg total) by mouth 3 (three) times daily with meals. 12/30/19   Purcell Emil Schanz, MD  carvedilol  (COREG ) 25 MG tablet Take 1 tablet (25 mg total) by mouth 2 (two) times daily with a meal. 04/17/19 11/06/23  Ezenduka, Nkeiruka J, MD  hydrALAZINE  (APRESOLINE ) 25 MG tablet Take 1 tablet (25 mg total) by mouth every 8 (eight) hours. 04/17/19 11/06/23  Ezenduka, Nkeiruka J, MD  lidocaine -prilocaine (EMLA) cream Apply topically. 06/11/19   [provider]  losartan (COZAAR) 25 MG  tablet Take 25 mg by mouth daily. 03/14/23   [provider]  pantoprazole  (PROTONIX ) 40 MG tablet Take 1 tablet (40 mg total) by mouth daily. 04/17/19   Ezenduka, Nkeiruka J, MD  VASCEPA  1 g capsule Take 2 capsules (2 g total) by mouth 2 (two) times daily. 04/24/23   Hilty, Vinie BROCKS, MD    Allergies  Allergen Reactions   Lokelma  [Sodium Zirconium Cyclosilicate ] Shortness Of Breath   Bovine (Beef) Protein-Containing Drug Products Other (See Comments)    Cultural preference   Pegademase Bovine Other (See Comments)    Cultural preference   Poractant Alfa Other (See Comments)    Cultural preference   Porcine (Pork) Protein-Containing Drug Products Other (See Comments)    Cultural preference    Patient Active Problem List   Diagnosis Date Noted   Chronic intractable headache 06/15/2023   Chronic bilateral low back pain without sciatica 12/29/2021   Renovascular hypertension 11/07/2021   Allergy, unspecified, sequela 01/27/2020   Personal history of anaphylaxis 01/27/2020   History of nephrectomy, right 07/23/2019   Obesity (BMI 30-39.9) 07/23/2019   Pre-transplant evaluation for kidney transplant 07/23/2019   Hypertensive heart disease with chronic systolic congestive heart failure (HCC) 07/01/2019   Iron deficiency anemia, unspecified 04/25/2019   Anemia in chronic kidney disease 04/17/2019   End stage renal disease on dialysis (HCC) 04/17/2019   Secondary hyperparathyroidism of renal origin 04/17/2019   DCM (dilated cardiomyopathy) (HCC)    History of colon polyps 03/20/2019   Gastroesophageal reflux disease  03/20/2019   CKD (chronic kidney disease) stage 4, GFR 15-29 ml/min (HCC) 08/25/2018   Testicular hypofunction 09/15/2015   BPH without obstruction/lower urinary tract symptoms 09/15/2015   Bilateral hearing loss 07/03/2015   Mixed hearing loss of right ear 07/03/2015   Dyslipidemia 03/29/2013   Nephrolithiasis 03/14/2013   Lumbar radiculopathy 02/26/2013   Vitamin  D deficiency 08/03/2011   HLD (hyperlipidemia) 04/10/2010    Past Medical History:  Diagnosis Date   Anemia    Back pain    CKD (chronic kidney disease), stage III (HCC)    Stage 4   Colon polyps    adenomatous   Elevated cholesterol    History of echocardiogram    Echo 11/17: EF 65-70, normal wall motion, grade 1 diastolic dysfunction, trivial MR, mild LAE, mild TR   Hyperlipidemia    Hypertension    no current bp meds for last 3 months   Kidney stones 2007   Medication intolerance    a. multiple with prior nonadherence to regimen.   Nausea     in the morning since having the kidney problem    Otosclerosis of both ears    Pneumonia    PONV (postoperative nausea and vomiting)    PUD (peptic ulcer disease)    Has had unspecified surgery for this   Sleep apnea     Past Surgical History:  Procedure Laterality Date   A/V FISTULAGRAM N/A 06/22/2023   Procedure: A/V Fistulagram;  Surgeon: Melia Lynwood ORN, MD;  Location: [facility] INVASIVE CV LAB;  Service: Cardiovascular;  Laterality: N/A;   AV FISTULA PLACEMENT Left 01/04/2019   Procedure: ARTERIOVENOUS (AV) FISTULA CREATION LEFT ARM;  Surgeon: Gretta Lonni PARAS, MD;  Location: [facility] OR;  Service: Vascular;  Laterality: Left;   BASCILIC VEIN TRANSPOSITION Left 03/11/2019   Procedure: BASILIC VEIN TRANSPOSITION 2ND STAGE LEFT;  Surgeon: Gretta Lonni PARAS, MD;  Location: [facility] OR;  Service: Vascular;  Laterality: Left;   NEPHRECTOMY  02/18/2011   Procedure: NEPHRECTOMY;  Surgeon: Thomasine Oiler, MD;  Location: [facility] ORS;  Service: Urology;  Laterality: Right;   SMALL INTESTINE SURGERY     STAPEDOTOMY  2005   lt ear jan, right ear sept   surgery for ulcers  1990    Social History   Socioeconomic History   Marital status: Married    Spouse name: Salya   Number of children: 5   Years of education: Not on file   Highest education level: 12th grade  Occupational History    Employer: [employer]  Tobacco Use   Smoking status: Never    Smokeless tobacco: Never  Vaping Use   Vaping status: Never Used  Substance and Sexual Activity   Alcohol use: No   Drug use: No   Sexual activity: Yes    Birth control/protection: None  Other Topics Concern   Not on file  Social History Narrative   Lives at home with wife and family. He is from [place]. Came to the [place]  in [place].      Patient is right-handed. He lives in a one level home. He drinks 1-2 cups of coffee and tea a day. He does not exercise.   Social Drivers of Corporate Investment Banker Strain: Low Risk  (08/25/2018)   Overall Financial Resource Strain (CARDIA)    Difficulty of Paying Living Expenses: Not hard at all  Food Insecurity: Low Risk  (11/21/2023)   Received from [facility]   Hunger Vital Sign    Within  the past 12 months, you worried that your food would run out before you got money to buy more: Never true    Within the past 12 months, the food you bought just didn't last and you didn't have money to get more. : Never true  Transportation Needs: No Transportation Needs (11/21/2023)   Received from [employer]    In the past 12 months, has lack of reliable transportation kept you from medical appointments, meetings, work or from getting things needed for daily living? : No  Physical Activity: Inactive (06/06/2023)   Exercise Vital Sign    Days of Exercise per Week: 0 days    Minutes of Exercise per Session: 0 min  Stress: No Stress Concern Present (08/25/2018)   [employer] of Occupational Health - Occupational Stress Questionnaire    Feeling of Stress : Not at all  Social Connections: Moderately Integrated (08/25/2018)   Social Connection and Isolation Panel    Frequency of Communication with Friends and Family: More than three times a week    Frequency of Social Gatherings with Friends and Family: More than three times a week    Attends Religious Services: More than 4 times per year    Active Member of [employer] or Organizations: No     Attends Banker Meetings: Never    Marital Status: Married  Catering Manager Violence: Not At Risk (08/25/2018)   Humiliation, Afraid, Rape, and Kick questionnaire    Fear of Current or Ex-Partner: No    Emotionally Abused: No    Physically Abused: No    Sexually Abused: No    Family History  Problem Relation Age of Onset   Hyperlipidemia Father    Heart disease Mother    Hypertension Mother    Hypertension Sister    Heart disease Brother 63       CABG   Hyperlipidemia Sister    Hyperlipidemia Sister    Esophageal cancer Cousin    Healthy Child    Colon cancer Neg Hx    Stomach cancer Neg Hx    Rectal cancer Neg Hx      Review of Systems  Constitutional: Negative.  Negative for chills and fever.  HENT: Negative.  Negative for congestion and sore throat.   Respiratory: Negative.  Negative for cough and shortness of breath.   Cardiovascular: Negative.  Negative for chest pain and palpitations.  Gastrointestinal:  Negative for abdominal pain, diarrhea, nausea and vomiting.  Genitourinary: Negative.  Negative for dysuria and hematuria.  Skin: Negative.   Neurological:  Positive for dizziness.    Today's Vitals   01/10/24 0847  TempSrc: Oral  Weight: 247 lb (112 kg)  Height: 5' 9 (1.753 m)   Body mass index is 36.48 kg/m.   Physical Exam Vitals reviewed.  Constitutional:      Appearance: Normal appearance.  HENT:     Head: Normocephalic.  Eyes:     Extraocular Movements: Extraocular movements intact.  Cardiovascular:     Rate and Rhythm: Normal rate.  Pulmonary:     Effort: Pulmonary effort is normal.  Skin:    General: Skin is warm and dry.  Neurological:     Mental Status: He is alert and oriented to person, place, and time.  Psychiatric:        Mood and Affect: Mood normal.        Behavior: Behavior normal.      ASSESSMENT & PLAN: Problem List Items Addressed This Visit  Cardiovascular and Mediastinum   Renovascular  hypertension (Chronic)   BP Readings from Last 3 Encounters:  11/10/23 106/72  06/22/23 (!) 174/92  06/15/23 (!) 164/80  Today's blood pressure 116/62 Low blood pressure readings at home and in the office Recommend dose adjustments of medications Continue amlodipine  5 mg daily Reduce hydralazine  to once a day Continue carvedilol  25 mg twice a day         Genitourinary   End stage renal disease on dialysis (HCC) - Primary   Stable. Awaiting kidney transplant       Patient Instructions  Continue amlodipine  5 mg daily Continue carvedilol  Reduce dose of hydralazine  to 25 mg once or twice a day depending on readings Needs to follow-up with nephrologist and dialysis team Continue kidney transplant plans as per nephrologist  Hypertension, Adult High blood pressure (hypertension) is when the force of blood pumping through the arteries is too strong. The arteries are the blood vessels that carry blood from the heart throughout the body. Hypertension forces the heart to work harder to pump blood and may cause arteries to become narrow or stiff. Untreated or uncontrolled hypertension can lead to a heart attack, heart failure, a stroke, kidney disease, and other problems. A blood pressure reading consists of a higher number over a lower number. Ideally, your blood pressure should be below 120/80. The first (top) number is called the systolic pressure. It is a measure of the pressure in your arteries as your heart beats. The second (bottom) number is called the diastolic pressure. It is a measure of the pressure in your arteries as the heart relaxes. What are the causes? The exact cause of this condition is not known. There are some conditions that result in high blood pressure. What increases the risk? Certain factors may make you more likely to develop high blood pressure. Some of these risk factors are under your control, including: Smoking. Not getting enough exercise or physical  activity. Being overweight. Having too much fat, sugar, calories, or salt (sodium) in your diet. Drinking too much alcohol. Other risk factors include: Having a personal history of heart disease, diabetes, high cholesterol, or kidney disease. Stress. Having a family history of high blood pressure and high cholesterol. Having obstructive sleep apnea. Age. The risk increases with age. What are the signs or symptoms? High blood pressure may not cause symptoms. Very high blood pressure (hypertensive crisis) may cause: Headache. Fast or irregular heartbeats (palpitations). Shortness of breath. Nosebleed. Nausea and vomiting. Vision changes. Severe chest pain, dizziness, and seizures. How is this diagnosed? This condition is diagnosed by measuring your blood pressure while you are seated, with your arm resting on a flat surface, your legs uncrossed, and your feet flat on the floor. The cuff of the blood pressure monitor will be placed directly against the skin of your upper arm at the level of your heart. Blood pressure should be measured at least twice using the same arm. Certain conditions can cause a difference in blood pressure between your right and left arms. If you have a high blood pressure reading during one visit or you have normal blood pressure with other risk factors, you may be asked to: Return on a different day to have your blood pressure checked again. Monitor your blood pressure at home for 1 week or longer. If you are diagnosed with hypertension, you may have other blood or imaging tests to help your health care provider understand your overall risk for other conditions. How is this  treated? This condition is treated by making healthy lifestyle changes, such as eating healthy foods, exercising more, and reducing your alcohol intake. You may be referred for counseling on a healthy diet and physical activity. Your health care provider may prescribe medicine if lifestyle changes  are not enough to get your blood pressure under control and if: Your systolic blood pressure is above 130. Your diastolic blood pressure is above 80. Your personal target blood pressure may vary depending on your medical conditions, your age, and other factors. Follow these instructions at home: Eating and drinking  Eat a diet that is high in fiber and potassium, and low in sodium, added sugar, and fat. An example of this eating plan is called the DASH diet. DASH stands for Dietary Approaches to Stop Hypertension. To eat this way: Eat plenty of fresh fruits and vegetables. Try to fill one half of your plate at each meal with fruits and vegetables. Eat whole grains, such as whole-wheat pasta, brown rice, or whole-grain bread. Fill about one fourth of your plate with whole grains. Eat or drink low-fat dairy products, such as skim milk or low-fat yogurt. Avoid fatty cuts of meat, processed or cured meats, and poultry with skin. Fill about one fourth of your plate with lean proteins, such as fish, chicken without skin, beans, eggs, or tofu. Avoid pre-made and processed foods. These tend to be higher in sodium, added sugar, and fat. Reduce your daily sodium intake. Many people with hypertension should eat less than 1,500 mg of sodium a day. Do not drink alcohol if: Your health care provider tells you not to drink. You are pregnant, may be pregnant, or are planning to become pregnant. If you drink alcohol: Limit how much you have to: 0-1 drink a day for women. 0-2 drinks a day for men. Know how much alcohol is in your drink. In the [place] one drink equals one 12 oz bottle of beer (355 mL), one 5 oz glass of wine (148 mL), or one 1 oz glass of hard liquor (44 mL). Lifestyle  Work with your health care provider to maintain a healthy body weight or to lose weight. Ask what an ideal weight is for you. Get at least 30 minutes of exercise that causes your heart to beat faster (aerobic exercise) most  days of the week. Activities may include walking, swimming, or biking. Include exercise to strengthen your muscles (resistance exercise), such as Pilates or lifting weights, as part of your weekly exercise routine. Try to do these types of exercises for 30 minutes at least 3 days a week. Do not use any products that contain nicotine or tobacco. These products include cigarettes, chewing tobacco, and vaping devices, such as e-cigarettes. If you need help quitting, ask your health care provider. Monitor your blood pressure at home as told by your health care provider. Keep all follow-up visits. This is important. Medicines Take over-the-counter and prescription medicines only as told by your health care provider. Follow directions carefully. Blood pressure medicines must be taken as prescribed. Do not skip doses of blood pressure medicine. Doing this puts you at risk for problems and can make the medicine less effective. Ask your health care provider about side effects or reactions to medicines that you should watch for. Contact a health care provider if you: Think you are having a reaction to a medicine you are taking. Have headaches that keep coming back (recurring). Feel dizzy. Have swelling in your ankles. Have trouble with your vision. Get  help right away if you: Develop a severe headache or confusion. Have unusual weakness or numbness. Feel faint. Have severe pain in your chest or abdomen. Vomit repeatedly. Have trouble breathing. These symptoms may be an emergency. Get help right away. Call 911. Do not wait to see if the symptoms will go away. Do not drive yourself to the hospital. Summary Hypertension is when the force of blood pumping through your arteries is too strong. If this condition is not controlled, it may put you at risk for serious complications. Your personal target blood pressure may vary depending on your medical conditions, your age, and other factors. For most people,  a normal blood pressure is less than 120/80. Hypertension is treated with lifestyle changes, medicines, or a combination of both. Lifestyle changes include losing weight, eating a healthy, low-sodium diet, exercising more, and limiting alcohol. This information is not intended to replace advice given to you by your health care provider. Make sure you discuss any questions you have with your health care provider. Document Revised: 12/08/2020 Document Reviewed: 12/08/2020 Elsevier Patient Education  2024 Elsevier Inc.     Emil Schaumann, MD [facility]

## 2024-01-10 NOTE — Assessment & Plan Note (Signed)
Stable.  Awaiting kidney transplant.

## 2024-01-10 NOTE — Assessment & Plan Note (Addendum)
 BP Readings from Last 3 Encounters:  11/10/23 106/72  06/22/23 (!) 174/92  06/15/23 (!) 164/80  Today's blood pressure 116/62 Low blood pressure readings at home and in the office Recommend dose adjustments of medications Continue amlodipine  5 mg daily Reduce hydralazine  to once a day Continue carvedilol  25 mg twice a day

## 2024-01-10 NOTE — Patient Instructions (Signed)
 Continue amlodipine  5 mg daily Continue carvedilol  Reduce dose of hydralazine  to 25 mg once or twice a day depending on readings Needs to follow-up with nephrologist and dialysis team Continue kidney transplant plans as per nephrologist  Hypertension, Adult High blood pressure (hypertension) is when the force of blood pumping through the arteries is too strong. The arteries are the blood vessels that carry blood from the heart throughout the body. Hypertension forces the heart to work harder to pump blood and may cause arteries to become narrow or stiff. Untreated or uncontrolled hypertension can lead to a heart attack, heart failure, a stroke, kidney disease, and other problems. A blood pressure reading consists of a higher number over a lower number. Ideally, your blood pressure should be below 120/80. The first (top) number is called the systolic pressure. It is a measure of the pressure in your arteries as your heart beats. The second (bottom) number is called the diastolic pressure. It is a measure of the pressure in your arteries as the heart relaxes. What are the causes? The exact cause of this condition is not known. There are some conditions that result in high blood pressure. What increases the risk? Certain factors may make you more likely to develop high blood pressure. Some of these risk factors are under your control, including: Smoking. Not getting enough exercise or physical activity. Being overweight. Having too much fat, sugar, calories, or salt (sodium) in your diet. Drinking too much alcohol. Other risk factors include: Having a personal history of heart disease, diabetes, high cholesterol, or kidney disease. Stress. Having a family history of high blood pressure and high cholesterol. Having obstructive sleep apnea. Age. The risk increases with age. What are the signs or symptoms? High blood pressure may not cause symptoms. Very high blood pressure (hypertensive  crisis) may cause: Headache. Fast or irregular heartbeats (palpitations). Shortness of breath. Nosebleed. Nausea and vomiting. Vision changes. Severe chest pain, dizziness, and seizures. How is this diagnosed? This condition is diagnosed by measuring your blood pressure while you are seated, with your arm resting on a flat surface, your legs uncrossed, and your feet flat on the floor. The cuff of the blood pressure monitor will be placed directly against the skin of your upper arm at the level of your heart. Blood pressure should be measured at least twice using the same arm. Certain conditions can cause a difference in blood pressure between your right and left arms. If you have a high blood pressure reading during one visit or you have normal blood pressure with other risk factors, you may be asked to: Return on a different day to have your blood pressure checked again. Monitor your blood pressure at home for 1 week or longer. If you are diagnosed with hypertension, you may have other blood or imaging tests to help your health care provider understand your overall risk for other conditions. How is this treated? This condition is treated by making healthy lifestyle changes, such as eating healthy foods, exercising more, and reducing your alcohol intake. You may be referred for counseling on a healthy diet and physical activity. Your health care provider may prescribe medicine if lifestyle changes are not enough to get your blood pressure under control and if: Your systolic blood pressure is above 130. Your diastolic blood pressure is above 80. Your personal target blood pressure may vary depending on your medical conditions, your age, and other factors. Follow these instructions at home: Eating and drinking  Eat a diet that  is high in fiber and potassium, and low in sodium, added sugar, and fat. An example of this eating plan is called the DASH diet. DASH stands for Dietary Approaches to Stop  Hypertension. To eat this way: Eat plenty of fresh fruits and vegetables. Try to fill one half of your plate at each meal with fruits and vegetables. Eat whole grains, such as whole-wheat pasta, brown rice, or whole-grain bread. Fill about one fourth of your plate with whole grains. Eat or drink low-fat dairy products, such as skim milk or low-fat yogurt. Avoid fatty cuts of meat, processed or cured meats, and poultry with skin. Fill about one fourth of your plate with lean proteins, such as fish, chicken without skin, beans, eggs, or tofu. Avoid pre-made and processed foods. These tend to be higher in sodium, added sugar, and fat. Reduce your daily sodium intake. Many people with hypertension should eat less than 1,500 mg of sodium a day. Do not drink alcohol if: Your health care provider tells you not to drink. You are pregnant, may be pregnant, or are planning to become pregnant. If you drink alcohol: Limit how much you have to: 0-1 drink a day for women. 0-2 drinks a day for men. Know how much alcohol is in your drink. In the [place] one drink equals one 12 oz bottle of beer (355 mL), one 5 oz glass of wine (148 mL), or one 1 oz glass of hard liquor (44 mL). Lifestyle  Work with your health care provider to maintain a healthy body weight or to lose weight. Ask what an ideal weight is for you. Get at least 30 minutes of exercise that causes your heart to beat faster (aerobic exercise) most days of the week. Activities may include walking, swimming, or biking. Include exercise to strengthen your muscles (resistance exercise), such as Pilates or lifting weights, as part of your weekly exercise routine. Try to do these types of exercises for 30 minutes at least 3 days a week. Do not use any products that contain nicotine or tobacco. These products include cigarettes, chewing tobacco, and vaping devices, such as e-cigarettes. If you need help quitting, ask your health care provider. Monitor your  blood pressure at home as told by your health care provider. Keep all follow-up visits. This is important. Medicines Take over-the-counter and prescription medicines only as told by your health care provider. Follow directions carefully. Blood pressure medicines must be taken as prescribed. Do not skip doses of blood pressure medicine. Doing this puts you at risk for problems and can make the medicine less effective. Ask your health care provider about side effects or reactions to medicines that you should watch for. Contact a health care provider if you: Think you are having a reaction to a medicine you are taking. Have headaches that keep coming back (recurring). Feel dizzy. Have swelling in your ankles. Have trouble with your vision. Get help right away if you: Develop a severe headache or confusion. Have unusual weakness or numbness. Feel faint. Have severe pain in your chest or abdomen. Vomit repeatedly. Have trouble breathing. These symptoms may be an emergency. Get help right away. Call 911. Do not wait to see if the symptoms will go away. Do not drive yourself to the hospital. Summary Hypertension is when the force of blood pumping through your arteries is too strong. If this condition is not controlled, it may put you at risk for serious complications. Your personal target blood pressure may vary depending on your  medical conditions, your age, and other factors. For most people, a normal blood pressure is less than 120/80. Hypertension is treated with lifestyle changes, medicines, or a combination of both. Lifestyle changes include losing weight, eating a healthy, low-sodium diet, exercising more, and limiting alcohol. This information is not intended to replace advice given to you by your health care provider. Make sure you discuss any questions you have with your health care provider. Document Revised: 12/08/2020 Document Reviewed: 12/08/2020 Elsevier Patient Education  2024  [employer].

## 2024-01-14 DIAGNOSIS — Z992 Dependence on renal dialysis: Secondary | ICD-10-CM | POA: Diagnosis not present

## 2024-01-14 DIAGNOSIS — N186 End stage renal disease: Secondary | ICD-10-CM | POA: Diagnosis not present

## 2024-01-14 DIAGNOSIS — I129 Hypertensive chronic kidney disease with stage 1 through stage 4 chronic kidney disease, or unspecified chronic kidney disease: Secondary | ICD-10-CM | POA: Diagnosis not present

## 2024-01-29 ENCOUNTER — Encounter: Payer: Self-pay | Admitting: Physician Assistant

## 2024-01-29 ENCOUNTER — Ambulatory Visit: Admitting: Physician Assistant

## 2024-01-29 VITALS — BP 104/60 | HR 83 | Ht 70.0 in | Wt 243.0 lb

## 2024-01-29 DIAGNOSIS — N186 End stage renal disease: Secondary | ICD-10-CM | POA: Diagnosis not present

## 2024-01-29 DIAGNOSIS — Z992 Dependence on renal dialysis: Secondary | ICD-10-CM

## 2024-01-29 DIAGNOSIS — Z01818 Encounter for other preprocedural examination: Secondary | ICD-10-CM

## 2024-01-29 DIAGNOSIS — I5022 Chronic systolic (congestive) heart failure: Secondary | ICD-10-CM

## 2024-01-29 DIAGNOSIS — E785 Hyperlipidemia, unspecified: Secondary | ICD-10-CM

## 2024-01-29 DIAGNOSIS — Z136 Encounter for screening for cardiovascular disorders: Secondary | ICD-10-CM | POA: Diagnosis not present

## 2024-01-29 NOTE — Patient Instructions (Signed)
 Medication Instructions:  Your physician recommends that you continue on your current medications as directed. Please refer to the Current Medication list given to you today.  *If you need a refill on your cardiac medications before your next appointment, please call your pharmacy*  Lab Work: NONE ordered at this time of appointment   Testing/Procedures: Your physician has requested that you have an echocardiogram. Echocardiography is a painless test that uses sound waves to create images of your heart. It provides your doctor with information about the size and shape of your heart and how well your hearts chambers and valves are working. This procedure takes approximately one hour. There are no restrictions for this procedure. Please do NOT wear cologne, perfume, aftershave, or lotions (deodorant is allowed). Please arrive 15 minutes prior to your appointment time.  Please note: We ask at that you not bring children with you during ultrasound (echo/ vascular) testing. Due to room size and safety concerns, children are not allowed in the ultrasound rooms during exams. Our front office staff cannot provide observation of children in our lobby area while testing is being conducted. An adult accompanying a patient to their appointment will only be allowed in the ultrasound room at the discretion of the ultrasound technician under special circumstances. We apologize for any inconvenience.   Follow-Up: At [facility], you and your health needs are our priority.  As part of our continuing mission to provide you with exceptional heart care, our providers are all part of one team.  This team includes your primary Cardiologist (physician) and Advanced Practice Providers or APPs (Physician Assistants and Nurse Practitioners) who all work together to provide you with the care you need, when you need it.  Your next appointment:    Dr.Hilty MD only   We recommend signing up for the patient portal  called MyChart.  Sign up information is provided on this After Visit Summary.  MyChart is used to connect with patients for Virtual Visits (Telemedicine).  Patients are able to view lab/test results, encounter notes, upcoming appointments, etc.  Non-urgent messages can be sent to your provider as well.   To learn more about what you can do with MyChart, go to [email or web address].

## 2024-01-29 NOTE — Progress Notes (Signed)
 Cardiology Office Note:    Date:  01/29/2024   ID:  [name], DOB Mar 01, 1965, MRN [id number]  PCP:  Purcell Emil Schanz, MD   [facility] Providers Cardiologist:  Vinie JAYSON Maxcy, MD     Referring MD: Purcell Emil Schanz, MD   Chief Complaint  Patient presents with   Follow-up    HFrEF, ESRD    History of Present Illness:    [name] is a 58 y.o. male with a hx of ESRD on HD awaiting kidney transplant.  He has ESRD secondary to hypertension.  He has a solitary kidney s/p nephrectomy. He has been on HD since 2021 and does not make urine.  He also has anemia of chronic disease, BPH, dilated cardiomyopathy, hypertension.  He has been following with Dr. Maxcy for hypertriglyceridemia.  He presented to our lobby today reporting chest pain.  His English was difficult and he declined nurse, learning disability.  He was directed to the ER and declined.  He was placed as a work-in on my schedule.  He initially again declined translator stating he only uses a nurse, learning disability for doctors.  Ultimately we used the hydrologist.  He reports going to three different institutions to get on kidney transplant lists.  He had a nuclear stress test 03/17/2023 at [facility] that demonstrated a reversible basal to mid inferior perfusion defect that is likely related to attenuation artifact but cannot rule out ischemia in the RCA distribution.  On Dr. Katharina review, per notes, he felt this was low risk.  He was evaluated by the transplant team at [facility].  He is currently maintained on 5 mg amlodipine , 40 mg Lipitor, 25 mg carvedilol  twice daily, 50 mg hydralazine  3 times daily, 25 mg losartan, and Vascepa .  He states he has waiting for 5 years for a kidney and has seen three different hospital systems for kidney transplant. Recently in [place], he was told he needed to have his  heart evaluated.    He is sedentary due to needing HD - HD days on MWF. He is compliant with HD sessions.  He denies chest  pain.  He has shortness of breath prior to dialysis.  He does not bring his medication list.  He does not know what institution he saw in [place] .  In review of prior studies, echocardiogram in 2021 demonstrated a reduced LVEF.  This appeared improved on recent study at [facility] January 2025, but grade 2 DD mild TR mild pulmonary hypertension.   Past Medical History:  Diagnosis Date   Anemia    Back pain    CKD (chronic kidney disease), stage III (HCC)    Stage 4   Colon polyps    adenomatous   Elevated cholesterol    History of echocardiogram    Echo 11/17: EF 65-70, normal wall motion, grade 1 diastolic dysfunction, trivial MR, mild LAE, mild TR   Hyperlipidemia    Hypertension    no current bp meds for last 3 months   Kidney stones 2007   Medication intolerance    a. multiple with prior nonadherence to regimen.   Nausea     in the morning since having the kidney problem    Otosclerosis of both ears    Pneumonia    PONV (postoperative nausea and vomiting)    PUD (peptic ulcer disease)    Has had unspecified surgery for this   Sleep apnea     Past Surgical History:  Procedure Laterality Date  A/V FISTULAGRAM N/A 06/22/2023   Procedure: A/V Fistulagram;  Surgeon: Melia Lynwood ORN, MD;  Location: [facility] INVASIVE CV LAB;  Service: Cardiovascular;  Laterality: N/A;   AV FISTULA PLACEMENT Left 01/04/2019   Procedure: ARTERIOVENOUS (AV) FISTULA CREATION LEFT ARM;  Surgeon: Gretta Lonni PARAS, MD;  Location: [facility] OR;  Service: Vascular;  Laterality: Left;   BASCILIC VEIN TRANSPOSITION Left 03/11/2019   Procedure: BASILIC VEIN TRANSPOSITION 2ND STAGE LEFT;  Surgeon: Gretta Lonni PARAS, MD;  Location: [facility] OR;  Service: Vascular;  Laterality: Left;   NEPHRECTOMY  02/18/2011   Procedure: NEPHRECTOMY;  Surgeon: Thomasine Oiler, MD;  Location: [facility];  Service: Urology;  Laterality: Right;   SMALL INTESTINE SURGERY     STAPEDOTOMY  2005   lt ear jan, right ear sept   surgery for  ulcers  1990    Current Medications: Active Medications[1]   Allergies:   Lokelma  [sodium zirconium cyclosilicate ], Bovine (beef) protein-containing drug products, Pegademase bovine, Poractant alfa, and Porcine (pork) protein-containing drug products   Social History   Socioeconomic History   Marital status: Married    Spouse name: Salya   Number of children: 5   Years of education: Not on file   Highest education level: 12th grade  Occupational History    Employer: [employer]  Tobacco Use   Smoking status: Never   Smokeless tobacco: Never  Vaping Use   Vaping status: Never Used  Substance and Sexual Activity   Alcohol use: No   Drug use: No   Sexual activity: Yes    Birth control/protection: None  Other Topics Concern   Not on file  Social History Narrative   Lives at home with wife and family. He is from [place]. Came to the [place]  in [place].      Patient is right-handed. He lives in a one level home. He drinks 1-2 cups of coffee and tea a day. He does not exercise.   Social Drivers of Health   Tobacco Use: Low Risk (01/29/2024)   Patient History    Smoking Tobacco Use: Never    Smokeless Tobacco Use: Never    Passive Exposure: Not on file  Financial Resource Strain: Not on file  Food Insecurity: Low Risk (11/21/2023)   Received from [facility]   Epic    Within the past 12 months, you worried that your food would run out before you got money to buy more: Never true    Within the past 12 months, the food you bought just didn't last and you didn't have money to get more. : Never true  Transportation Needs: No Transportation Needs (11/21/2023)   Received from [facility]    In the past 12 months, has lack of reliable transportation kept you from medical appointments, meetings, work or from getting things needed for daily living? : No  Physical Activity: Inactive (06/06/2023)   Exercise Vital Sign    Days of Exercise per Week: 0 days    Minutes of  Exercise per Session: 0 min  Stress: Not on file  Social Connections: Not on file  Depression (PHQ2-9): Low Risk (01/10/2024)   Depression (PHQ2-9)    PHQ-2 Score: 0  Alcohol Screen: Low Risk (06/06/2023)   Alcohol Screen    Last Alcohol Screening Score (AUDIT): 0  Housing: Low Risk (11/21/2023)   Received from [facility]   Epic    What is your living situation today?: I have a steady place to live  Think about the place you live. Do you have problems with any of the following? Choose all that apply:: None/None on this list  Utilities: Low Risk (11/21/2023)   Received from [facility]   Utilities    In the past 12 months has the electric, gas, oil, or water company threatened to shut off services in your home? : No  Health Literacy: Not on file     Family History: The patient's family history includes Esophageal cancer in his cousin; Healthy in his child; Heart disease in his mother; Heart disease (age of onset: 18) in his brother; Hyperlipidemia in his father, sister, and sister; Hypertension in his mother and sister. There is no history of Colon cancer, Stomach cancer, or Rectal cancer.  ROS:   Please see the history of present illness.     All other systems reviewed and are negative.  EKGs/Labs/Other Studies Reviewed:    The following studies were reviewed today:  Stress test 02/2023: This result has an attachment that is not available.    ECG test result was negative for evidence of ischemia. There was no  chest pain during the exam.    There is a reversible basal to mid inferior perfusion defect that is  likely related to attenuation artifact; however, can not rule out   ischemia in the RCA distribution.    The SPECT images demonstrate a mostly reversible, small sized perfusion  abnormality of mild to moderate intensity located in the mid to basal  inferior wall.    Overall left ventricular systolic function was normal. The calculated  ejection fraction was measured  at 54%.    Low cardiovascular risk    Echo 02/2023: 1. Left ventricle  The left ventricular cavity size is normal. There is mild     concentric hypertrophy noted. Systolic function is normal. The ejection     fraction is 55%  +--5% , estimated visually. No segmental wall motion     abnormalities. Grade 2 diastolic dysfunction with elevated left atrial     pressure.  2. Tricuspid valve  There is mild tricuspid regurgitation.  3. Pulmonary arteries  Pulmonary artery estimated peak systolic pressure     39mm Hg. This is consistent with mild pulmonary hypertension.  No previous study was available for comparison.    Echo 03/2019: 1. Low normal GLS -14.9 EF has decreased since echo done July 2020 . Left  ventricular ejection fraction, by estimation, is 35 to 40%. The left  ventricle has moderately decreased function. The left ventricle  demonstrates global hypokinesis. The left  ventricular internal cavity size was moderately dilated. Left ventricular  diastolic parameters are consistent with Grade II diastolic dysfunction  (pseudonormalization). Elevated left ventricular end-diastolic pressure.   2. Right ventricular systolic function is normal. The right ventricular  size is normal. There is moderately elevated pulmonary artery systolic  pressure.   3. Left atrial size was moderately dilated.   4. Right atrial size was moderately dilated.   5. The mitral valve is normal in structure and function. Mild to moderate  mitral valve regurgitation. No evidence of mitral stenosis.   6. The aortic valve is normal in structure and function. Aortic valve  regurgitation is not visualized. No aortic stenosis is present.   7. Aortic dilatation noted. There is mild dilatation of the aortic root  measuring 40 mm.   8. The inferior vena cava is normal in size with greater than 50%  respiratory variability, suggesting right atrial pressure of 3  mmHg.   EKG Interpretation Date/Time:  Monday January 29 2024 15:27:03 EST Ventricular Rate:  83 PR Interval:  164 QRS Duration:  90 QT Interval:  370 QTC Calculation: 434 R Axis:   -5  Text Interpretation: Normal sinus rhythm Nonspecific ST and T wave abnormality When compared with ECG of 10-Nov-2023 13:17, PREVIOUS ECG IS PRESENT Confirmed by Madie Slough ([id number]) on 01/29/2024 3:40:26 PM    Recent Labs: 11/10/2023: BUN 21; Creatinine, Ser 6.52; Hemoglobin 13.1; Magnesium  2.1; Platelets 208; Potassium 4.4; Sodium 134  Recent Lipid Panel    Component Value Date/Time   CHOL 330 (H) 03/23/2021 1340   CHOL 365 (H) 05/12/2020 1504   TRIG (H) 03/23/2021 1340    1167.0 Triglyceride is over 400; calculations on Lipids are invalid.   HDL 27.40 (L) 03/23/2021 1340   HDL 26 (L) 05/12/2020 1504   CHOLHDL 12 03/23/2021 1340   VLDL 61 (H) 04/13/2019 0450   LDLCALC Comment (A) 05/12/2020 1504   LDLDIRECT 53.0 03/23/2021 1340     Risk Assessment/Calculations:                Physical Exam:    VS:  BP 104/60 (Cuff Size: Large)   Pulse 83   Ht 5' 10 (1.778 m)   Wt 243 lb (110.2 kg)   SpO2 98%   BMI 34.87 kg/m     Wt Readings from Last 3 Encounters:  01/29/24 243 lb (110.2 kg)  01/10/24 247 lb (112 kg)  11/10/23 235 lb 14.3 oz (107 kg)     GEN:  Well nourished, well developed in no acute distress HEENT: Normal NECK: No JVD; No carotid bruits LYMPHATICS: No lymphadenopathy CARDIAC: RRR, no murmurs, rubs, gallops RESPIRATORY:  Clear to auscultation without rales, wheezing or rhonchi  ABDOMEN: Soft, non-tender, non-distended MUSCULOSKELETAL:  No edema; No deformity  SKIN: Warm and dry NEUROLOGIC:  Alert and oriented x 3 PSYCHIATRIC:  Normal affect   ASSESSMENT:    1. Screening for cardiovascular condition   2. Chronic systolic congestive heart failure (HCC)   3. Hyperlipidemia with target LDL less than 70   4. ESRD (end stage renal disease) on dialysis (HCC)   5. Pre-transplant evaluation for kidney transplant    PLAN:     In order of problems listed above:  Heart failure with reduced ejection fraction Chronic heart failure with reduced ejection fraction, previously identified on echocardiogram. No recent cardiac studies in the past year. Current medications are appropriate, but updated cardiac function assessment is needed for potential medication adjustments and transplant clearance. - Ordered echocardiogram to assess current cardiac function and ejection fraction. - Asked him to sign a consent to obtain records from [place]  - Determine if additional testing is needed based on echocardiogram results.  Unclear who is currently managing his GDMT.   Per prior records he may be taking 5 mg amlodipine , 25 mg carvedilol  twice daily, 50 mg hydralazine  3 times daily, 25 mg losartan, 2 g Vascepa  twice daily, 40 mg Lipitor.  Will obtain echocardiogram.  He will follow-up with Dr. Mona.           Medication Adjustments/Labs and Tests Ordered: Current medicines are reviewed at length with the patient today.  Concerns regarding medicines are outlined above.  Orders Placed This Encounter  Procedures   EKG 12-Lead   ECHOCARDIOGRAM COMPLETE   No orders of the defined types were placed in this encounter.   Patient Instructions  Medication Instructions:  Your physician recommends that you continue  on your current medications as directed. Please refer to the Current Medication list given to you today.  *If you need a refill on your cardiac medications before your next appointment, please call your pharmacy*  Lab Work: NONE ordered at this time of appointment   Testing/Procedures: Your physician has requested that you have an echocardiogram. Echocardiography is a painless test that uses sound waves to create images of your heart. It provides your doctor with information about the size and shape of your heart and how well your hearts chambers and valves are working. This procedure takes approximately one  hour. There are no restrictions for this procedure. Please do NOT wear cologne, perfume, aftershave, or lotions (deodorant is allowed). Please arrive 15 minutes prior to your appointment time.  Please note: We ask at that you not bring children with you during ultrasound (echo/ vascular) testing. Due to room size and safety concerns, children are not allowed in the ultrasound rooms during exams. Our front office staff cannot provide observation of children in our lobby area while testing is being conducted. An adult accompanying a patient to their appointment will only be allowed in the ultrasound room at the discretion of the ultrasound technician under special circumstances. We apologize for any inconvenience.   Follow-Up: At [facility] you and your health needs are our priority.  As part of our continuing mission to provide you with exceptional heart care, our providers are all part of one team.  This team includes your primary Cardiologist (physician) and Advanced Practice Providers or APPs (Physician Assistants and Nurse Practitioners) who all work together to provide you with the care you need, when you need it.  Your next appointment:    Dr.Hilty MD only   We recommend signing up for the patient portal called MyChart.  Sign up information is provided on this After Visit Summary.  MyChart is used to connect with patients for Virtual Visits (Telemedicine).  Patients are able to view lab/test results, encounter notes, upcoming appointments, etc.  Non-urgent messages can be sent to your provider as well.   To learn more about what you can do with MyChart, go to [email or web address].             Signed, Jon Nat Hails, [place]  01/29/2024 4:47 PM    [facility]     [1]  Current Meds  Medication Sig   acetaminophen  (TYLENOL ) 325 MG tablet Take 325 mg by mouth as needed.   amLODipine  (NORVASC ) 5 MG tablet Take 1 tablet (5 mg total) by mouth daily.    atorvastatin  (LIPITOR) 40 MG tablet Take 1 tablet (40 mg total) by mouth daily.   B Complex-C-Zn-Folic Acid  (DIALYVITE 800 WITH ZINC) 0.8 MG TABS Take 1 tablet by mouth daily.   benzoyl peroxide  (BENZOYL PEROXIDE ) 5 % external liquid Apply topically 2 (two) times daily.   Betamethasone  Sodium Phosphate  6 MG/ML SOLN INJECT 2:4 SYRINGE BILATERALLY INTO EACH KNEE JOINT   calcium  acetate (PHOSLO ) 667 MG capsule Take 2 capsules (1,334 mg total) by mouth 3 (three) times daily with meals.   carvedilol  (COREG ) 25 MG tablet Take 1 tablet (25 mg total) by mouth 2 (two) times daily with a meal.   hydrALAZINE  (APRESOLINE ) 25 MG tablet Take 1 tablet (25 mg total) by mouth every 8 (eight) hours.   lidocaine -prilocaine (EMLA) cream Apply topically.   losartan (COZAAR) 50 MG tablet Take 50 mg by mouth daily.   pantoprazole  (PROTONIX ) 40 MG tablet Take 1 tablet (40  mg total) by mouth daily.   VASCEPA  1 g capsule Take 2 capsules (2 g total) by mouth 2 (two) times daily.   [DISCONTINUED] losartan (COZAAR) 25 MG tablet Take 25 mg by mouth daily.

## 2024-01-30 ENCOUNTER — Telehealth: Payer: Self-pay | Admitting: Internal Medicine

## 2024-01-30 NOTE — Telephone Encounter (Signed)
 M[facility] in [place] called in about pt getting possible kidney transplant. She states their surgeon does not feel comfortable moving forward with transplant until pt has a heart cath. She states this was based off his last stress test. Please advise.

## 2024-02-06 NOTE — Telephone Encounter (Signed)
 Kidney Wait List Patient Summary Report: [facility] Maintenance  Patient:  [name]     MRN:  [id number] Diagnosis:   Hypertensive Nephrosclerosis,    Center Waitlisted Date: 09/23/2022 ESRD dialysis start date: 04/13/2019 Kidney Transplant Waitlist - Qualified: 08/07/2018 - [facility]  Days on Waitlist: 2009  as of 02/06/2024 Transplant Phase: Waitlist Transplant Status: Active 09/23/2022        Protocols:  Standard   On Call Comments - Alerts: 11/02/2022 - Per surgery - Previous incision in lower abdomen/slightly R of the midline which may possibly get in the way of our normal RLQ kidney transplant incision. Feelsincision is more towards the midline and could still place the kidney on RLQ but can be determine by the operating surgeon.  - Weight max 249 lbs - Consented YES to Hep B/C, consented yes to [facility] donors but does not meet criteria - Pt speaks Arabic - will need interpreter  - Prior UNOS Qualified date: 04/13/2019, modified qualifying date from eGFR adjustment: 08/07/2018    ABO:  Lab Results  Component Value Date   ABORH O Positive 07/06/2023   CPRA:  0 09/23/2022    EPTS: 47 at 02/06/2024  7:55 AM Calculated from: Age: 59 years Has Diabetes: No Prior solid organ transplant: No Dialysis: 4 years 9 months  Cardiovascular Disease Risk Assessment:  PORT SCORE: TXP CVD PORT Score : 13  1ST YEAR CHSD EVENT %: 4.4   [facility] Maintenance History Appts:  Transplant Nephrology: 01/30/23 [place] Transplant Surgeon: 11/07/23 [place] Testing(s):  Echo- 03/17/23  Nuc- 03/17/23 reviewed with [facility]. Repeat in 1 year  CTAP w/wo- 11/01/22 ID, [place]- 08/17/22 Colonoscopy: 03/28/2019 (next due 03/2024   Additional Comments: 01/29/24 followed by [facility] seen by Jon [facility] 11/21/23 followed by [facility] last seen 11/21/23 Hepatitis B Surface Antibody, Quantitative (mIU/mL)  Date Value  07/06/2023 59.0   Hemoglobin A1c (%)  Date Value  07/06/2023 5.0  02/27/2023 5.0  07/22/2019  5.5     Recent Hospitalization/ER Visits upon summary review: Yes 11/10/23 ED visit [place] generalized malaise/weakness  Multi-listed: Yes   [facility], [place] Donors: No Decease Donor Acceptance Criteria with [facility]:   Accept HCV NAT+: Yes Accept history of diabetes: Yes Accept A2B donor: Yes Accept HBV NAT+: No Accept A2 donor: No   Accept HBV Core+: Yes   Hepatitis B Surface Antibody, Quantitative (mIU/mL)  Date Value  07/06/2023 59.0   Accept HCV Ab+/ HCV Nat+: Yes     Accept KDPI >85%: No   Accept DCD: Yes     Referring Provider:  Mateo Amelie Romney  Maintenance Dialysis History      Start End Type Center Comments   08/04/2022  [facility] 1st shift MWF   04/13/2019 08/03/2022  [facility] Tuesday, Thursday, Saturday  Second         Current Dialysis Center Information     [facility]     Phone: [phone] Fax:    Address: [address] [place] [address]

## 2024-02-07 ENCOUNTER — Ambulatory Visit (HOSPITAL_COMMUNITY)
Admission: RE | Admit: 2024-02-07 | Discharge: 2024-02-07 | Disposition: A | Discharge status: Home / Self Care | Source: Ambulatory Visit | Attending: Physician Assistant | Admitting: Physician Assistant

## 2024-02-07 DIAGNOSIS — I5022 Chronic systolic (congestive) heart failure: Secondary | ICD-10-CM | POA: Diagnosis not present

## 2024-02-08 LAB — ECHOCARDIOGRAM COMPLETE
Area-P 1/2: 5.62 cm2
S' Lateral: 3.5 cm

## 2024-02-13 ENCOUNTER — Ambulatory Visit: Payer: Self-pay | Admitting: Physician Assistant

## 2024-02-19 ENCOUNTER — Other Ambulatory Visit: Payer: Self-pay | Admitting: Internal Medicine

## 2024-02-19 ENCOUNTER — Other Ambulatory Visit: Payer: Self-pay | Admitting: *Deleted

## 2024-02-19 ENCOUNTER — Ambulatory Visit: Attending: Internal Medicine | Admitting: Internal Medicine

## 2024-02-19 VITALS — BP 125/76 | HR 85 | Ht 70.0 in | Wt 248.0 lb

## 2024-02-19 DIAGNOSIS — E785 Hyperlipidemia, unspecified: Secondary | ICD-10-CM | POA: Diagnosis not present

## 2024-02-19 DIAGNOSIS — Z01818 Encounter for other preprocedural examination: Secondary | ICD-10-CM | POA: Diagnosis not present

## 2024-02-19 DIAGNOSIS — I5022 Chronic systolic (congestive) heart failure: Secondary | ICD-10-CM

## 2024-02-19 DIAGNOSIS — N186 End stage renal disease: Secondary | ICD-10-CM | POA: Diagnosis not present

## 2024-02-19 DIAGNOSIS — Z992 Dependence on renal dialysis: Secondary | ICD-10-CM | POA: Diagnosis not present

## 2024-02-19 NOTE — H&P (View-Only) (Signed)
 "  OFFICE NOTE  Chief Complaint:  Evaluation for renal transplant  Primary Care Physician: Purcell Emil Schanz, MD  Primary Cardiologist:  Vinie JAYSON Maxcy, MD  HPI:  [name] is a 59 y.o. male who is being seen today for the evaluation of dyslipidemia at the request of Sagardia, Emil Schanz, MD.  This is a pleasant 59 year old Arabic male who was seen today with a adult nurse.  His past medical history significant for chronic kidney disease which is stage IV and currently a GFR of around 10.  He has been followed by Dr. Douglass with nephrology and has an appointment in October to see Dr. Gretta with vascular surgery for creation of a fistula.  He has had longstanding dyslipidemia and multiple medication intolerances.  He was previously followed in the [facility] lipid clinic by [name], PharmD.  When he was last seen in November 2019, he was on rosuvastatin  10 mg daily and Lovaza  2 g twice daily.  He was reportedly intolerance to atorvastatin  80 mg causing myalgias and fenofibrate  160 mg causing myalgias.  I reviewed the medicine list with him today and looked at all the medications that were in his possession.  After working with the translator we determined that he was still taking the rosuvastatin  10 mg daily and also fenofibrate  160 mg daily.  He did not have any of the Lovaza .  Previously he was also evaluated apparently for Vascepa  but felt to be unable to obtain that due to financial constraints.  He is working and does have [employer] but is in application for disability.  There is a strong family history of coronary disease in multiple family members.  His only other complaint today is some shortness of breath and next edema.  Is difficult to know whether shortness of breath may be related to advanced chronic kidney disease or coronary disease.  Given his advanced creatinine, it is unlikely that he could have any dye related cardiovascular work-up.  His last stress test was  in 2015.  His most recent lipids from August 24, 2018 showed total cholesterol 313, triglycerides 1340, HDL 24 and direct LDL of 63.  03/12/2019  [name] returns today for follow-up of hypertriglyceridemia.  He has been taking Vascepa  2 g twice daily.  This has caused a resultant decrease in his triglycerides from 1340-741.  Although this is still elevated, represents a significant reduction.  It is difficult to get his triglycerides much lower given his underlying renal disease.  Unfortunately he was not a candidate for a fibrate.  He reports other medication intolerances.  With this medicine he has says through the translator that he thinks he is having headaches and possibly some abdominal pain.  I advised him to decrease the dose to 1 g twice daily, although not as effective, it may have less chance for side effect.  If this is better tolerated he should continue it and if not then he will have no choice but to stop it.  I do not see any other options for lowering triglycerides at this time other than strict diet a very low saturated fats less than 5 to 10% of calories.  I provided him with this information today including dietary recommendations.  09/02/2019  [name] was seen today for follow-up with a professional Print production planner via the [employer]  He had marked improvement in his triglycerides on Vascepa  however unfortunately had progressive renal dysfunction and ultimately has gone on dialysis.  Prior to  that he was having headaches and other symptoms which I believe are related to end-stage renal disease however he felt it could be related to the Vascepa  and discontinue the medicine.  Not surprisingly his triglycerides have accordingly increased.  At one point triglycerides were well over thousand and had come down to 305 but recently went up to 607.  12/04/2019  [name] is seen today in follow-up.  He is not accompanied by a nurse, learning disability.  Unfortunately his English is  difficult to understand.  I did note that his lipids have essentially been unchanged.  Triglycerides are still elevated at 746 despite the fact that he reports taking the Vascepa  2 g twice daily.  He is on dialysis.  Unfortunately he recently had COVID-19 was treated with monoclonal antibody therapy about a month ago.  He says he is recovered from that.  05/12/2020  [name] returns for follow-up.  He has not had recent lipid testing.  He reports compliance with his Vascepa .  He says he is having some difficulty getting it.  Triglycerides have been significantly elevated.  Because he is end-stage renal disease he is not on a statin.  He is followed by Dr. Dolan.  He recently been having some issues with headache after dialysis.  A CT scan was ordered of the head and neck and performed in early March and was negative.  I provided those results to them today.  04/24/2023  [name] returns today with a professional Print production planner.  He said [facility] and [facility].  The echo showed LVEF 55% with grade 2 diastolic dysfunction and some pulmonary hypertension.  Stress test was negative for ischemia although did show a fixed inferior defect thought to be attenuation artifact.  Overall study was low risk.  He had denied any chest pain.  He reports he does get short of breath right before he is ready for dialysis.  He is also had issues with difficult to control hypertension.  His lipids have been poorly controlled.  Most of this is secondary.  Recent labs showed total cholesterol 313 with an LDL 135 and triglycerides over 1000.  He says he is out of his Vascepa , but that the medicine may have caused him some side effects.  We talked about statin which she has previously tried both atorvastatin  and rosuvastatin  and said that they may have caused him side effects.  I was speaking with him about some other options and reiterated the fact that there  is very little if any mortality data for statins for patients with end-stage renal disease on dialysis.  02/19/2024  [name] is seen today for follow-up with a professional Print production planner.  He has been pursuing a renal transplant for about 5 years and contacted our office today after being seen at the [facility] [place] .  They seemed to consider him a good candidate for renal transplant however apparently he underwent a stress test in January which showed possible inferior perfusion defect.  A left heart catheterization was recommended.  He is asymptomatic denying any chest pain.  He would now prefer to have his catheterization here rather than in [place] .  PMHx:  Past Medical History:  Diagnosis Date   Anemia    Back pain    CKD (chronic kidney disease), stage III (HCC)    Stage 4   Colon polyps    adenomatous   Elevated cholesterol    History of echocardiogram  Echo 11/17: EF 65-70, normal wall motion, grade 1 diastolic dysfunction, trivial MR, mild LAE, mild TR   Hyperlipidemia    Hypertension    no current bp meds for last 3 months   Kidney stones 2007   Medication intolerance    a. multiple with prior nonadherence to regimen.   Nausea     in the morning since having the kidney problem    Otosclerosis of both ears    Pneumonia    PONV (postoperative nausea and vomiting)    PUD (peptic ulcer disease)    Has had unspecified surgery for this   Sleep apnea     Past Surgical History:  Procedure Laterality Date   A/V FISTULAGRAM N/A 06/22/2023   Procedure: A/V Fistulagram;  Surgeon: Melia Lynwood ORN, MD;  Location: [facility] INVASIVE CV LAB;  Service: Cardiovascular;  Laterality: N/A;   AV FISTULA PLACEMENT Left 01/04/2019   Procedure: ARTERIOVENOUS (AV) FISTULA CREATION LEFT ARM;  Surgeon: Gretta Lonni PARAS, MD;  Location: [facility] OR;  Service: Vascular;  Laterality: Left;   BASCILIC VEIN TRANSPOSITION Left 03/11/2019   Procedure: BASILIC VEIN TRANSPOSITION 2ND STAGE  LEFT;  Surgeon: Gretta Lonni PARAS, MD;  Location: [facility] OR;  Service: Vascular;  Laterality: Left;   NEPHRECTOMY  02/18/2011   Procedure: NEPHRECTOMY;  Surgeon: Thomasine Oiler, MD;  Location: [facility] ORS;  Service: Urology;  Laterality: Right;   SMALL INTESTINE SURGERY     STAPEDOTOMY  2005   lt ear jan, right ear sept   surgery for ulcers  1990    FAMHx:  Family History  Problem Relation Age of Onset   Hyperlipidemia Father    Heart disease Mother    Hypertension Mother    Hypertension Sister    Heart disease Brother 34       CABG   Hyperlipidemia Sister    Hyperlipidemia Sister    Esophageal cancer Cousin    Healthy Child    Colon cancer Neg Hx    Stomach cancer Neg Hx    Rectal cancer Neg Hx     SOCHx:   reports that he has never smoked. He has never used smokeless tobacco. He reports that he does not drink alcohol and does not use drugs.  ALLERGIES:  Allergies  Allergen Reactions   Lokelma  [Sodium Zirconium Cyclosilicate ] Shortness Of Breath   Bovine (Beef) Protein-Containing Drug Products Other (See Comments)    Cultural preference   Pegademase Bovine Other (See Comments)    Cultural preference   Poractant Alfa Other (See Comments)    Cultural preference   Porcine (Pork) Protein-Containing Drug Products Other (See Comments)    Cultural preference    ROS: Pertinent items noted in HPI and remainder of comprehensive ROS otherwise negative.  HOME MEDS: Current Outpatient Medications on File Prior to Visit  Medication Sig Dispense Refill   acetaminophen  (TYLENOL ) 325 MG tablet Take 325 mg by mouth as needed.     amLODipine  (NORVASC ) 5 MG tablet Take 1 tablet (5 mg total) by mouth daily. 30 tablet 0   atorvastatin  (LIPITOR) 40 MG tablet Take 1 tablet (40 mg total) by mouth daily. 90 tablet 3   B Complex-C-Zn-Folic Acid  (DIALYVITE 800 WITH ZINC) 0.8 MG TABS Take 1 tablet by mouth daily.     benzoyl peroxide  (BENZOYL PEROXIDE ) 5 % external liquid Apply topically 2 (two)  times daily. 142 g 12   Betamethasone  Sodium Phosphate  6 MG/ML SOLN INJECT 2:4 SYRINGE BILATERALLY INTO [facility] KNEE JOINT  calcium  acetate (PHOSLO ) 667 MG capsule Take 2 capsules (1,334 mg total) by mouth 3 (three) times daily with meals. 180 capsule 3   carvedilol  (COREG ) 25 MG tablet Take 1 tablet (25 mg total) by mouth 2 (two) times daily with a meal. 60 tablet 0   hydrALAZINE  (APRESOLINE ) 25 MG tablet Take 1 tablet (25 mg total) by mouth every 8 (eight) hours. 90 tablet 0   lidocaine -prilocaine (EMLA) cream Apply topically.     losartan (COZAAR) 50 MG tablet Take 50 mg by mouth daily.     pantoprazole  (PROTONIX ) 40 MG tablet Take 1 tablet (40 mg total) by mouth daily. 30 tablet 5   VASCEPA  1 g capsule Take 2 capsules (2 g total) by mouth 2 (two) times daily. 360 capsule 3   No current facility-administered medications on file prior to visit.    LABS/IMAGING: No results found for this or any previous visit (from the past 48 hours). No results found.  LIPID PANEL:    Component Value Date/Time   CHOL 330 (H) 03/23/2021 1340   CHOL 365 (H) 05/12/2020 1504   TRIG (H) 03/23/2021 1340    1167.0 Triglyceride is over 400; calculations on Lipids are invalid.   HDL 27.40 (L) 03/23/2021 1340   HDL 26 (L) 05/12/2020 1504   CHOLHDL 12 03/23/2021 1340   VLDL 61 (H) 04/13/2019 0450   LDLCALC Comment (A) 05/12/2020 1504   LDLDIRECT 53.0 03/23/2021 1340    WEIGHTS: Wt Readings from Last 3 Encounters:  02/19/24 248 lb (112.5 kg)  01/29/24 243 lb (110.2 kg)  01/10/24 247 lb (112 kg)    VITALS: BP 125/76 (BP Location: Right Arm)   Pulse 85   Ht 5' 10 (1.778 m)   Wt 248 lb (112.5 kg)   SpO2 97%   BMI 35.58 kg/m   EXAM: General appearance: alert and no distress Lungs: clear to auscultation bilaterally Heart: regular rate and rhythm Extremities: extremities normal, atraumatic, no cyanosis or edema and fistula of the left upper extremity, positive thrill Neurologic: Grossly  normal  EKG: EKG Interpretation Date/Time:  Monday February 19 2024 14:12:31 EST Ventricular Rate:  85 PR Interval:  168 QRS Duration:  92 QT Interval:  380 QTC Calculation: 452 R Axis:   -1  Text Interpretation: Normal sinus rhythm Normal ECG When compared with ECG of 29-Jan-2024 15:27, Nonspecific T wave abnormality no longer evident in Lateral leads Confirmed by Mona Kent [phone]) on 02/19/2024 2:23:22 PM  OUTSIDE STUDIES:   [facility] Outside Information   Stress test with myocardial perfusion  Anatomical Region Laterality Modality  -- -- Nuclear Medicine   Narrative  This result has an attachment that is not available.   ECG test result was negative for evidence of ischemia. There was no chest pain during the exam.   There is a reversible basal to mid inferior perfusion defect that is likely related to attenuation artifact; however, can not rule out   ischemia in the RCA distribution.   The SPECT images demonstrate a mostly reversible, small sized perfusion abnormality of mild to moderate intensity located in the mid to basal inferior wall.   Overall left ventricular systolic function was normal. The calculated ejection fraction was measured at 54%.   Low cardiovascular risk  Stress Findings The patient received an intravenous injection of 0.4 milligrams of regadenoson over ten to fifteen seconds. A hand grip was also used during the study. The baseline heart rate was 78 beats per minute, and one minute  after the regadenoson injection the heart rate was measured at 93 beats per minute. The baseline blood pressure was 172/87 mmHg, and was159/83 mmHg one minute after the injection.There were 75 mg aminophylline given to the patient to reverse the stress. The patient demonstrated a hypotensive blood pressure response during the stress test. The patient reported dizziness, dyspnea and nausea during the stress test. Symptoms began during peak stress and ended during  recovery. .The test was terminated due to completion of the protocol.  ECG The baseline ECG showed normal sinus rhythm. There were no diagnostic ST changes suggestive of ischemia noted during the stress. There were no changes when compared to the baseline and stress ECGs. ECG test result was negative for evidence of ischemia. There was no chest pain during the exam.  Isotope Administration A 1-day rest/stress protocol was performed with regadenoson as the stress agent. The patient was an outpatient. The study was gated. The left ventricle ejection fraction was 54%. Resting myocardial perfusion imaging was performed 45 minutes after the intravenous injection of 10 mCi of myoview. Post-stress tomographic imaging was performed 45 minutes after stress. The patient was injected intravenously with 30.7 mCi of myoview. The injection site used was the right hand.  Perfusion Comments The overall quality of the study is good. Increased bowel uptake of the radioactive tracer caused attenuation artifact. There was diaphramatic attentuation during the study. The SPECT images demonstrate a mostly reversible, small sized perfusion abnormality of mild to moderate intensity located in the mid to basal inferior wall.  Study Impression There is a reversible basal to mid inferior perfusion defect that is likely related to attenuation artifact; however, can not rule out ischemia in the RCA distribution.  Gated Data Overall left ventricular systolic function was normal. The calculated ejection fraction was measured at 54%. Gated SPECT imaging demonstrated normal wall motion.  Bullseye Score The Summed Stress Score (SSS) was measured at 7. The Summed Rest Score (SRS) was 0. 7 was the difference, or Summed Difference Score (SDS).The stress defect size was 10.3% with an ischemic zone measurement of 10.3%.  PCI risk Nuc reccomendations Low cardiovascular risk  ASSESSMENT: Secondary hypertriglyceridemia secondary to  advanced kidney disease Progressive dyspnea on exertion, ? coronary equivalent ESRD-now on dialysis Low risk Myoview stress test with fixed inferior perfusion defect (02/2023) LVEF 55% with grade 2 diastolic dysfunction and pulmonary hypertension (02/2023)  PLAN: 1.   [name] desires renal transplant.  He underwent a nuclear perfusion study at [facility] in January which showed a reversible basal to mid inferior perfusion defect likely artifact however ischemia in the RCA distribution could not be excluded.  He was then seen recently for possible renal transplant in [place]  and they recommended a definitive heart catheterization.  He returns to me today for this assessment.  We discussed the risk, benefits and alternatives of cardiac catheterization and he is agreeable to proceed.  He generally is dialyzed on Monday, Wednesday and Friday.  We will try to arrange for cardiac catheterization next Tuesday.  Of note, he does have an AV fistulogram coming up this week with Dr. Serene because of some dialysis access issues.  I would like that to be cleared up before we consider catheterization.  Will obtain preprocedure lab work including a lipid profile (since he is overdue for this) and plan follow-up with me afterwards.  Vinie KYM Maxcy, MD, [facility], FNLA, FACP  [place]  [facility]  Medical Director of the Advanced Lipid Disorders &  Cardiovascular  Risk Reduction Clinic Diplomate of the American Board of Clinical Lipidology Attending Cardiologist  Direct Dial: [phone]  Fax: [phone]  Website:  [email or web address]   Vinie BROCKS Markise Haymer 02/19/2024, 2:23 PM "

## 2024-02-19 NOTE — Progress Notes (Addendum)
 "  OFFICE NOTE  Chief Complaint:  Evaluation for renal transplant  Primary Care Physician: Purcell Emil Schanz, MD  Primary Cardiologist:  Vinie JAYSON Maxcy, MD  HPI:  [name] is a 59 y.o. male who is being seen today for the evaluation of dyslipidemia at the request of Sagardia, Emil Schanz, MD.  This is a pleasant 59 year old Arabic male who was seen today with a adult nurse.  His past medical history significant for chronic kidney disease which is stage IV and currently a GFR of around 10.  He has been followed by Dr. Douglass with nephrology and has an appointment in October to see Dr. Gretta with vascular surgery for creation of a fistula.  He has had longstanding dyslipidemia and multiple medication intolerances.  He was previously followed in the [facility] lipid clinic by Gerard Pointer, PharmD.  When he was last seen in November 2019, he was on rosuvastatin  10 mg daily and Lovaza  2 g twice daily.  He was reportedly intolerance to atorvastatin  80 mg causing myalgias and fenofibrate  160 mg causing myalgias.  I reviewed the medicine list with him today and looked at all the medications that were in his possession.  After working with the translator we determined that he was still taking the rosuvastatin  10 mg daily and also fenofibrate  160 mg daily.  He did not have any of the Lovaza .  Previously he was also evaluated apparently for Vascepa  but felt to be unable to obtain that due to financial constraints.  He is working and does have [employer] but is in application for disability.  There is a strong family history of coronary disease in multiple family members.  His only other complaint today is some shortness of breath and next edema.  Is difficult to know whether shortness of breath may be related to advanced chronic kidney disease or coronary disease.  Given his advanced creatinine, it is unlikely that he could have any dye related cardiovascular work-up.  His last stress test was  in 2015.  His most recent lipids from August 24, 2018 showed total cholesterol 313, triglycerides 1340, HDL 24 and direct LDL of 63.  03/12/2019  [name] returns today for follow-up of hypertriglyceridemia.  He has been taking Vascepa  2 g twice daily.  This has caused a resultant decrease in his triglycerides from 1340-741.  Although this is still elevated, represents a significant reduction.  It is difficult to get his triglycerides much lower given his underlying renal disease.  Unfortunately he was not a candidate for a fibrate.  He reports other medication intolerances.  With this medicine he has says through the translator that he thinks he is having headaches and possibly some abdominal pain.  I advised him to decrease the dose to 1 g twice daily, although not as effective, it may have less chance for side effect.  If this is better tolerated he should continue it and if not then he will have no choice but to stop it.  I do not see any other options for lowering triglycerides at this time other than strict diet a very low saturated fats less than 5 to 10% of calories.  I provided him with this information today including dietary recommendations.  09/02/2019  [name] was seen today for follow-up with a professional Print production planner via the [employer]  He had marked improvement in his triglycerides on Vascepa  however unfortunately had progressive renal dysfunction and ultimately has gone on dialysis.  Prior to  that he was having headaches and other symptoms which I believe are related to end-stage renal disease however he felt it could be related to the Vascepa  and discontinue the medicine.  Not surprisingly his triglycerides have accordingly increased.  At one point triglycerides were well over thousand and had come down to 305 but recently went up to 607.  12/04/2019  Mr. Ing is seen today in follow-up.  He is not accompanied by a nurse, learning disability.  Unfortunately his English is  difficult to understand.  I did note that his lipids have essentially been unchanged.  Triglycerides are still elevated at 746 despite the fact that he reports taking the Vascepa  2 g twice daily.  He is on dialysis.  Unfortunately he recently had COVID-19 was treated with monoclonal antibody therapy about a month ago.  He says he is recovered from that.  05/12/2020  [name] returns for follow-up.  He has not had recent lipid testing.  He reports compliance with his Vascepa .  He says he is having some difficulty getting it.  Triglycerides have been significantly elevated.  Because he is end-stage renal disease he is not on a statin.  He is followed by Dr. Dolan.  He recently been having some issues with headache after dialysis.  A CT scan was ordered of the head and neck and performed in early March and was negative.  I provided those results to them today.  04/24/2023  [name] returns today with a professional Print production planner.  He said [facility] and [facility].  The echo showed LVEF 55% with grade 2 diastolic dysfunction and some pulmonary hypertension.  Stress test was negative for ischemia although did show a fixed inferior defect thought to be attenuation artifact.  Overall study was low risk.  He had denied any chest pain.  He reports he does get short of breath right before he is ready for dialysis.  He is also had issues with difficult to control hypertension.  His lipids have been poorly controlled.  Most of this is secondary.  Recent labs showed total cholesterol 313 with an LDL 135 and triglycerides over 1000.  He says he is out of his Vascepa , but that the medicine may have caused him some side effects.  We talked about statin which she has previously tried both atorvastatin  and rosuvastatin  and said that they may have caused him side effects.  I was speaking with him about some other options and reiterated the fact that there  is very little if any mortality data for statins for patients with end-stage renal disease on dialysis.  02/19/2024  Mr. Chinchilla is seen today for follow-up with a professional Print production planner.  He has been pursuing a renal transplant for about 5 years and contacted our office today after being seen at the [facility] [place] .  They seemed to consider him a good candidate for renal transplant however apparently he underwent a stress test in January which showed possible inferior perfusion defect.  A left heart catheterization was recommended.  He is asymptomatic denying any chest pain.  He would now prefer to have his catheterization here rather than in [place] .  PMHx:  Past Medical History:  Diagnosis Date   Anemia    Back pain    CKD (chronic kidney disease), stage III (HCC)    Stage 4   Colon polyps    adenomatous   Elevated cholesterol    History of echocardiogram  Echo 11/17: EF 65-70, normal wall motion, grade 1 diastolic dysfunction, trivial MR, mild LAE, mild TR   Hyperlipidemia    Hypertension    no current bp meds for last 3 months   Kidney stones 2007   Medication intolerance    a. multiple with prior nonadherence to regimen.   Nausea     in the morning since having the kidney problem    Otosclerosis of both ears    Pneumonia    PONV (postoperative nausea and vomiting)    PUD (peptic ulcer disease)    Has had unspecified surgery for this   Sleep apnea     Past Surgical History:  Procedure Laterality Date   A/V FISTULAGRAM N/A 06/22/2023   Procedure: A/V Fistulagram;  Surgeon: Melia Lynwood ORN, MD;  Location: [facility] INVASIVE CV LAB;  Service: Cardiovascular;  Laterality: N/A;   AV FISTULA PLACEMENT Left 01/04/2019   Procedure: ARTERIOVENOUS (AV) FISTULA CREATION LEFT ARM;  Surgeon: Gretta Lonni PARAS, MD;  Location: [facility] OR;  Service: Vascular;  Laterality: Left;   BASCILIC VEIN TRANSPOSITION Left 03/11/2019   Procedure: BASILIC VEIN TRANSPOSITION 2ND STAGE  LEFT;  Surgeon: Gretta Lonni PARAS, MD;  Location: [facility] OR;  Service: Vascular;  Laterality: Left;   NEPHRECTOMY  02/18/2011   Procedure: NEPHRECTOMY;  Surgeon: Thomasine Oiler, MD;  Location: [facility] ORS;  Service: Urology;  Laterality: Right;   SMALL INTESTINE SURGERY     STAPEDOTOMY  2005   lt ear jan, right ear sept   surgery for ulcers  1990    FAMHx:  Family History  Problem Relation Age of Onset   Hyperlipidemia Father    Heart disease Mother    Hypertension Mother    Hypertension Sister    Heart disease Brother 34       CABG   Hyperlipidemia Sister    Hyperlipidemia Sister    Esophageal cancer Cousin    Healthy Child    Colon cancer Neg Hx    Stomach cancer Neg Hx    Rectal cancer Neg Hx     SOCHx:   reports that he has never smoked. He has never used smokeless tobacco. He reports that he does not drink alcohol and does not use drugs.  ALLERGIES:  Allergies  Allergen Reactions   Lokelma  [Sodium Zirconium Cyclosilicate ] Shortness Of Breath   Bovine (Beef) Protein-Containing Drug Products Other (See Comments)    Cultural preference   Pegademase Bovine Other (See Comments)    Cultural preference   Poractant Alfa Other (See Comments)    Cultural preference   Porcine (Pork) Protein-Containing Drug Products Other (See Comments)    Cultural preference    ROS: Pertinent items noted in HPI and remainder of comprehensive ROS otherwise negative.  HOME MEDS: Current Outpatient Medications on File Prior to Visit  Medication Sig Dispense Refill   acetaminophen  (TYLENOL ) 325 MG tablet Take 325 mg by mouth as needed.     amLODipine  (NORVASC ) 5 MG tablet Take 1 tablet (5 mg total) by mouth daily. 30 tablet 0   atorvastatin  (LIPITOR) 40 MG tablet Take 1 tablet (40 mg total) by mouth daily. 90 tablet 3   B Complex-C-Zn-Folic Acid  (DIALYVITE 800 WITH ZINC) 0.8 MG TABS Take 1 tablet by mouth daily.     benzoyl peroxide  (BENZOYL PEROXIDE ) 5 % external liquid Apply topically 2 (two)  times daily. 142 g 12   Betamethasone  Sodium Phosphate  6 MG/ML SOLN INJECT 2:4 SYRINGE BILATERALLY INTO [facility] KNEE JOINT  calcium  acetate (PHOSLO ) 667 MG capsule Take 2 capsules (1,334 mg total) by mouth 3 (three) times daily with meals. 180 capsule 3   carvedilol  (COREG ) 25 MG tablet Take 1 tablet (25 mg total) by mouth 2 (two) times daily with a meal. 60 tablet 0   hydrALAZINE  (APRESOLINE ) 25 MG tablet Take 1 tablet (25 mg total) by mouth every 8 (eight) hours. 90 tablet 0   lidocaine -prilocaine (EMLA) cream Apply topically.     losartan (COZAAR) 50 MG tablet Take 50 mg by mouth daily.     pantoprazole  (PROTONIX ) 40 MG tablet Take 1 tablet (40 mg total) by mouth daily. 30 tablet 5   VASCEPA  1 g capsule Take 2 capsules (2 g total) by mouth 2 (two) times daily. 360 capsule 3   No current facility-administered medications on file prior to visit.    LABS/IMAGING: No results found for this or any previous visit (from the past 48 hours). No results found.  LIPID PANEL:    Component Value Date/Time   CHOL 330 (H) 03/23/2021 1340   CHOL 365 (H) 05/12/2020 1504   TRIG (H) 03/23/2021 1340    1167.0 Triglyceride is over 400; calculations on Lipids are invalid.   HDL 27.40 (L) 03/23/2021 1340   HDL 26 (L) 05/12/2020 1504   CHOLHDL 12 03/23/2021 1340   VLDL 61 (H) 04/13/2019 0450   LDLCALC Comment (A) 05/12/2020 1504   LDLDIRECT 53.0 03/23/2021 1340    WEIGHTS: Wt Readings from Last 3 Encounters:  02/19/24 248 lb (112.5 kg)  01/29/24 243 lb (110.2 kg)  01/10/24 247 lb (112 kg)    VITALS: BP 125/76 (BP Location: Right Arm)   Pulse 85   Ht 5' 10 (1.778 m)   Wt 248 lb (112.5 kg)   SpO2 97%   BMI 35.58 kg/m   EXAM: General appearance: alert and no distress Lungs: clear to auscultation bilaterally Heart: regular rate and rhythm Extremities: extremities normal, atraumatic, no cyanosis or edema and fistula of the left upper extremity, positive thrill Neurologic: Grossly  normal  EKG: EKG Interpretation Date/Time:  Monday February 19 2024 14:12:31 EST Ventricular Rate:  85 PR Interval:  168 QRS Duration:  92 QT Interval:  380 QTC Calculation: 452 R Axis:   -1  Text Interpretation: Normal sinus rhythm Normal ECG When compared with ECG of 29-Jan-2024 15:27, Nonspecific T wave abnormality no longer evident in Lateral leads Confirmed by Mona Kent [phone]) on 02/19/2024 2:23:22 PM  OUTSIDE STUDIES:   [facility] Outside Information   Stress test with myocardial perfusion  Anatomical Region Laterality Modality  -- -- Nuclear Medicine   Narrative  This result has an attachment that is not available.   ECG test result was negative for evidence of ischemia. There was no chest pain during the exam.   There is a reversible basal to mid inferior perfusion defect that is likely related to attenuation artifact; however, can not rule out   ischemia in the RCA distribution.   The SPECT images demonstrate a mostly reversible, small sized perfusion abnormality of mild to moderate intensity located in the mid to basal inferior wall.   Overall left ventricular systolic function was normal. The calculated ejection fraction was measured at 54%.   Low cardiovascular risk  Stress Findings The patient received an intravenous injection of 0.4 milligrams of regadenoson over ten to fifteen seconds. A hand grip was also used during the study. The baseline heart rate was 78 beats per minute, and one minute  after the regadenoson injection the heart rate was measured at 93 beats per minute. The baseline blood pressure was 172/87 mmHg, and was159/83 mmHg one minute after the injection.There were 75 mg aminophylline given to the patient to reverse the stress. The patient demonstrated a hypotensive blood pressure response during the stress test. The patient reported dizziness, dyspnea and nausea during the stress test. Symptoms began during peak stress and ended during  recovery. .The test was terminated due to completion of the protocol.  ECG The baseline ECG showed normal sinus rhythm. There were no diagnostic ST changes suggestive of ischemia noted during the stress. There were no changes when compared to the baseline and stress ECGs. ECG test result was negative for evidence of ischemia. There was no chest pain during the exam.  Isotope Administration A 1-day rest/stress protocol was performed with regadenoson as the stress agent. The patient was an outpatient. The study was gated. The left ventricle ejection fraction was 54%. Resting myocardial perfusion imaging was performed 45 minutes after the intravenous injection of 10 mCi of myoview. Post-stress tomographic imaging was performed 45 minutes after stress. The patient was injected intravenously with 30.7 mCi of myoview. The injection site used was the right hand.  Perfusion Comments The overall quality of the study is good. Increased bowel uptake of the radioactive tracer caused attenuation artifact. There was diaphramatic attentuation during the study. The SPECT images demonstrate a mostly reversible, small sized perfusion abnormality of mild to moderate intensity located in the mid to basal inferior wall.  Study Impression There is a reversible basal to mid inferior perfusion defect that is likely related to attenuation artifact; however, can not rule out ischemia in the RCA distribution.  Gated Data Overall left ventricular systolic function was normal. The calculated ejection fraction was measured at 54%. Gated SPECT imaging demonstrated normal wall motion.  Bullseye Score The Summed Stress Score (SSS) was measured at 7. The Summed Rest Score (SRS) was 0. 7 was the difference, or Summed Difference Score (SDS).The stress defect size was 10.3% with an ischemic zone measurement of 10.3%.  PCI risk Nuc reccomendations Low cardiovascular risk  ASSESSMENT: Secondary hypertriglyceridemia secondary to  advanced kidney disease Progressive dyspnea on exertion, ? coronary equivalent ESRD-now on dialysis Low risk Myoview stress test with fixed inferior perfusion defect (02/2023) LVEF 55% with grade 2 diastolic dysfunction and pulmonary hypertension (02/2023)  PLAN: 1.   Mr. Vonstein desires renal transplant.  He underwent a nuclear perfusion study at [facility] in January which showed a reversible basal to mid inferior perfusion defect likely artifact however ischemia in the RCA distribution could not be excluded.  He was then seen recently for possible renal transplant in [place]  and they recommended a definitive heart catheterization.  He returns to me today for this assessment.  We discussed the risk, benefits and alternatives of cardiac catheterization and he is agreeable to proceed.  He generally is dialyzed on Monday, Wednesday and Friday.  We will try to arrange for cardiac catheterization next Tuesday.  Of note, he does have an AV fistulogram coming up this week with Dr. Serene because of some dialysis access issues.  I would like that to be cleared up before we consider catheterization.  Will obtain preprocedure lab work including a lipid profile (since he is overdue for this) and plan follow-up with me afterwards.  Vinie KYM Maxcy, MD, [facility], FNLA, FACP  [place]  [facility]  Medical Director of the Advanced Lipid Disorders &  Cardiovascular  Risk Reduction Clinic Diplomate of the American Board of Clinical Lipidology Attending Cardiologist  Direct Dial: [phone]  Fax: [phone]  Website:  [email or web address]   Vinie BROCKS Markise Haymer 02/19/2024, 2:23 PM "

## 2024-02-19 NOTE — Patient Instructions (Addendum)
 Medication Instructions:  NO CHNAGES *If you need a refill on your cardiac medications before your next appointment, please call your pharmacy*  Lab Work:  BMET and CBC today -- pre-procedure lab work  Lipid Panel and direct LDL -- check cholesterol The lab is on the 1st Floor  If you have labs (blood work) drawn today and your tests are completely normal, you will receive your results only by: MyChart Message (if you have MyChart) OR A paper copy in the mail If you have any lab test that is abnormal or we need to change your treatment, we will call you to review the results.  Testing/Procedures:  Heart Catheterization at [facility] on Thursday 02/29/2024 with Dr. Jordan Arrive at 8:30am   Follow-Up: At [facility], you and your health needs are our priority.  As part of our continuing mission to provide you with exceptional heart care, our providers are all part of one team.  This team includes your primary Cardiologist (physician) and Advanced Practice Providers or APPs (Physician Assistants and Nurse Practitioners) who all work together to provide you with the care you need, when you need it.  Your next appointment:    12 months with Dr. Mona  We recommend signing up for the patient portal called MyChart.  Sign up information is provided on this After Visit Summary.  MyChart is used to connect with patients for Virtual Visits (Telemedicine).  Patients are able to view lab/test results, encounter notes, upcoming appointments, etc.  Non-urgent messages can be sent to your provider as well.   To learn more about what you can do with MyChart, go to [email or web address].   Other Instructions   [facility] A DEPT OF [place] HEARTCARE AT [address] A DEPT OF THE [place] [address] [place] [address] Dept: [phone] Loc: [phone]  [name]  02/19/2024  You are scheduled for a Cardiac Catheterization  on Thursday, January 15 with Dr. Peter Jordan.  1. Please arrive at the [facility] (Main Entrance A) at [facility]: [address] [place], [place] [address] at 8:30 AM (This time is 2 hour(s) before your procedure to ensure your preparation).   Free valet parking service is available. You will check in at ADMITTING. The support person will be asked to wait in the waiting room.  It is OK to have someone drop you off and come back when you are ready to be discharged.    Special note: Every effort is made to have your procedure done on time. Please understand that emergencies sometimes delay scheduled procedures.  2. Diet: Nothing to eat after midnight.   3. Hydration: On January 15, you may drink approved liquids (see below) until 2 hours before the procedure time.       List of approved liquids water, clear juice, clear tea, black coffee, fruit juices, non-citric and without pulp, carbonated beverages, Gatorade, Kool -Aid, plain Jello-O and plain ice popsicles.  4. Labs: You will need to have blood drawn TODAY  5. Medication instructions in preparation for your procedure:   Contrast Allergy: No  On the morning of your procedure, take Aspirin 81 mg and any morning medicines NOT listed above.  You may use sips of water.  6. Plan to go home the same day, you will only stay overnight if medically necessary. 7. Bring a current list of your medications and current insurance cards. 8. You MUST have a  responsible person to drive you home. 9. Someone MUST be with you the first 24 hours after you arrive home or your discharge will be delayed. 10. Please wear clothes that are easy to get on and off and wear slip-on shoes.  Thank you for allowing us  to care for you!   -- [facility]

## 2024-02-20 ENCOUNTER — Ambulatory Visit: Payer: Self-pay | Admitting: Internal Medicine

## 2024-02-20 ENCOUNTER — Encounter (HOSPITAL_COMMUNITY): Payer: Self-pay

## 2024-02-20 LAB — CBC
Hematocrit: 34.4 % — ABNORMAL LOW (ref 37.5–51.0)
Hemoglobin: 11.9 g/dL — ABNORMAL LOW (ref 13.0–17.7)
MCH: 29.8 pg (ref 26.6–33.0)
MCHC: 34.6 g/dL (ref 31.5–35.7)
MCV: 86 fL (ref 79–97)
Platelets: 219 x10E3/uL (ref 150–450)
RBC: 3.99 x10E6/uL — ABNORMAL LOW (ref 4.14–5.80)
RDW: 14 % (ref 11.6–15.4)
WBC: 7.3 x10E3/uL (ref 3.4–10.8)

## 2024-02-20 LAB — BASIC METABOLIC PANEL WITH GFR
BUN/Creatinine Ratio: 4 — ABNORMAL LOW (ref 9–20)
BUN: 31 mg/dL — ABNORMAL HIGH (ref 6–24)
CO2: 25 mmol/L (ref 20–29)
Calcium: 9.1 mg/dL (ref 8.7–10.2)
Chloride: 94 mmol/L — ABNORMAL LOW (ref 96–106)
Creatinine, Ser: 7.14 mg/dL — ABNORMAL HIGH (ref 0.76–1.27)
Glucose: 96 mg/dL (ref 70–99)
Potassium: 4.6 mmol/L (ref 3.5–5.2)
Sodium: 136 mmol/L (ref 134–144)
eGFR: 8 mL/min/1.73 — ABNORMAL LOW

## 2024-02-20 LAB — LIPID PANEL
Chol/HDL Ratio: 16.6 ratio — ABNORMAL HIGH (ref 0.0–5.0)
Cholesterol, Total: 366 mg/dL — ABNORMAL HIGH (ref 100–199)
HDL: 22 mg/dL — ABNORMAL LOW
Triglycerides: 1494 mg/dL (ref 0–149)

## 2024-02-20 LAB — LDL CHOLESTEROL, DIRECT: LDL Direct: 58 mg/dL (ref 0–99)

## 2024-02-21 ENCOUNTER — Encounter (HOSPITAL_BASED_OUTPATIENT_CLINIC_OR_DEPARTMENT_OTHER): Admitting: Nurse Practitioner

## 2024-02-22 ENCOUNTER — Ambulatory Visit (HOSPITAL_COMMUNITY): Admission: RE | Admit: 2024-02-22 | Source: Home / Self Care | Admitting: Surgery

## 2024-02-22 ENCOUNTER — Encounter (HOSPITAL_COMMUNITY): Admission: RE | Payer: Self-pay | Source: Home / Self Care

## 2024-02-22 SURGERY — A/V FISTULAGRAM
Anesthesia: LOCAL | Site: Arm Upper | Laterality: Left

## 2024-02-27 ENCOUNTER — Telehealth: Payer: Self-pay | Admitting: *Deleted

## 2024-02-27 NOTE — Telephone Encounter (Signed)
 Patient tells me A/V Fistulagram is now scheduled for 03/14/24.

## 2024-02-27 NOTE — Progress Notes (Signed)
 Interpreter services used for this communication.   First name of interpreter:  Rula Your Remote Interpreter ID number:  [id number]

## 2024-02-27 NOTE — Progress Notes (Signed)
 "   Name: [name] Date of visit: 02/27/2024  Chief Complaint   Chief Complaint  Patient presents with   Annual Exam   Difficulty Urinating    Subjective  [name] is a 59 y.o. male who presents today for follow up on chronic conditions and physical. Patient is familiar to me, last seen on 09/12/2023.   History of Present Illness The patient presents for evaluation of kidney disease. He is accompanied by a translator (ID [phone]).  He has been diagnosed with kidney disease, which has led to a cessation of urination for the past 4 months. He has been on a dialysis regimen for the past 5 years, undergoing the procedure 3 times a week. He is currently on the transplant list and is seeking assistance from the team to facilitate a kidney transplant. He expresses frustration over his inability to find a cure for his condition since 2004, despite undergoing surgeries and taking medications.  The patient had long conversations with the interpreter to share with me regarding his frustrations with being shipped around to different specialist and doing different tasks and nobody has given him a medicine to fix him.  I explained that there is no medicine to fix him and that he needs a kidney transplant and he is on the list.  He says there are no obstacles and he does not understand why he has not gotten it yet but unfortunately I cannot answer his question for him.  I strongly encouraged him to advocate all of this to his transplant coordinator at his visit next month so potentially they could answer the question a little better for him.  Concerns about potential issues with reproductive organs due to kidney disease are reported. He fears that his sperm may dry up like his urine and is apprehensive about losing his ability to engage in sexual activities. He has been informed that a kidney transplant could potentially restore normal urination and sperm production. He has not consulted a urologist  regarding his sperm content.  He does not want to be referred for this problem because is not a problem yet but he is expressing fear that it could become a problem.  I tried to reassure him that the 2 were not interconnected and that way and there were several other factors that affected his ability to have an erection and ejaculate.  It looks like at least 5 years ago he told his PCP that he was having trouble ejaculating but at that time he was on an SSRI and no further workup was done.  Currently he says that is not a problem.  The patient has been residing in the [place]  for the past 25 years and is an Emergency planning/management officer. He has been informed that he needs to undergo heart catheterization due to his dialysis treatment and the associated difficulty in climbing stairs. He reports no heart-related issues and believes that the only reason for the catheterization is his dialysis treatment and the associated difficulty in climbing stairs.  I explained that I think this is being done in preparation for a kidney transplant but he did not believe that was the case.    Patient Active Problem List  Diagnosis   Acquired absence of kidney   Acute exacerbation of CHF (congestive heart failure)    (CMD)   Anemia in chronic kidney disease, on chronic dialysis    (CMD)   Bilateral hearing loss   BPH without obstruction/lower urinary tract symptoms  Dependence on renal dialysis   DCM (dilated cardiomyopathy) (CMD)   Hypertensive heart disease with chronic systolic congestive heart failure    (CMD)   History of nephrectomy, right   HLD (hyperlipidemia)   History of colon polyps   Essential hypertension   Gastroesophageal reflux disease   Iron deficiency anemia   Lumbar radiculopathy   Metabolic acidosis   Nephrolithiasis   Obesity (BMI 30-39.9)   Secondary hyperparathyroidism of renal origin (CMD)   ESRD (end stage renal disease) (CMD)   Chronic bilateral low back pain without  sciatica   Low left ventricular ejection fraction   Other headache syndrome   Pure hypercholesterolemia, unspecified   Vitamin D  deficiency   Testicular hypofunction   Deviated nasal septum   Nasal congestion   Drooling   Pre-transplant evaluation for kidney transplant   Pain, neck   Cervicogenic headache   Decreased ROM of intervertebral discs of cervical spine   Chronic bilateral thoracic back pain   Chronic systolic congestive heart failure    (CMD)   CKD (chronic kidney disease) stage 5, GFR less than 15 ml/min    (CMD)   CKD (chronic kidney disease) stage 4, GFR 15-29 ml/min    (CMD)   Diarrhea, unspecified   Dyslipidemia   Disorder of phosphorus metabolism, unspecified   Finding of abnormal level of heavy metals in blood   Mixed hearing loss of right ear   Personal history of anaphylaxis   Peripheral vascular disease, unspecified   Renovascular hypertension   Sensorineural hearing loss (SNHL) of both ears   Fluid overload, unspecified   Cough variant asthma (CMD)   Chronic rhinitis   Anosmia   History of COVID-19   Past Surgical History:  Procedure Laterality Date   EXPLORATORY LAPAROTOMY     abdomen for GIB in [place]   EXTERNAL EAR SURGERY Bilateral    NEPHRECTOMY Right    Family History  Problem Relation Name Age of Onset   Hyperlipidemia Mother     Heart disease Brother     Social History   Socioeconomic History   Marital status: Married    Spouse name: Not on file   Number of children: Not on file   Years of education: Not on file   Highest education level: Not on file  Occupational History   Not on file  Tobacco Use   Smoking status: Never   Smokeless tobacco: Never  Vaping Use   Vaping status: Never Used  Substance and Sexual Activity   Alcohol use: Never   Drug use: Never   Sexual activity: Yes    Partners: Female  Other Topics Concern   Not on file  Social History Narrative   Not on file    Social Drivers of Health   Living Situation: Low Risk (11/21/2023)   Living Situation    What is your living situation today?: I have a steady place to live    Think about the place you live. Do you have problems with any of the following? Choose all that apply:: None/None on this list  Food Insecurity: Low Risk (11/21/2023)   Food vital sign    Within the past 12 months, you worried that your food would run out before you got money to buy more: Never true    Within the past 12 months, the food you bought just didn't last and you didn't have money to get more: Never true  Transportation Needs: No Transportation Needs (11/21/2023)   Transportation  In the past 12 months, has lack of reliable transportation kept you from medical appointments, meetings, work or from getting things needed for daily living? : No  Utilities: Low Risk (11/21/2023)   Utilities    In the past 12 months has the electric, gas, oil, or water company threatened to shut off services in your home? : No  Safety: Low Risk (11/21/2023)   Safety    How often does anyone, including family and friends, physically hurt you?: Never    How often does anyone, including family and friends, insult or talk down to you?: Never    How often does anyone, including family and friends, threaten you with harm?: Never    How often does anyone, including family and friends, scream or curse at you?: Never  Alcohol Screening: Not At Risk (01/22/2024)   Alcohol    Audit C Alcohol risk score: 0  Tobacco Use: Low Risk (02/27/2024)   Patient History    Smoking Tobacco Use: Never    Smokeless Tobacco Use: Never    Passive Exposure: Not on file  Depression: Not At Risk (11/21/2023)   PHQ-2    PHQ-2 Score: 0  Recent Concern: Depression - At Risk (09/12/2023)   PHQ-2    PHQ-2 Score: 4  Social Connections: Not on file  Financial Resource Strain: Not on file      Patient denies new or worsening headaches, vision changes, N/V,  chest pain, palpitations, SOB, abdominal pain or issues with bowel.   Current Outpatient Medications  Medication Instructions   ACETAMINOPHEN  ORAL 650 mg   Ajovy Autoinjector 225 mg, subcutaneous, Every 30 days   amLODIPine  (NORVASC ) 5 mg, At bedtime   atorvastatin  (LIPITOR) 40 mg, At bedtime   baclofen (LIORESAL) 5-10 mg, oral, 3 times daily PRN   benzoyl peroxide  5 %   budesonide-formoteroL (Symbicort) 160-4.5 mcg/actuation inhaler 2 puffs, inhalation, 2 times daily RT   calcium  acetate (PHOSLO ) 667 mg (169 mg calcium ) capsule    carvedilol  (COREG ) 25 mg   Dialyvite 800 with Zinc 50 0.8-50 mg tab 1 tablet, Daily   hydrALAZINE  (APRESOLINE ) 50 mg, 3 times daily   lidocaine -prilocaine-silicone 2.5-2.5 % kit Apply topically.   losartan (COZAAR) 25 mg, Daily   methoxy peg-epoetin  beta (MIRCERA INJ) 30 mcg   montelukast (SINGULAIR) 10 mg, oral, At bedtime   ondansetron  (ZOFRAN ) 4 mg, oral, Every 8 hours PRN   pantoprazole  (PROTONIX ) 40 mg, Daily   promethazine -dextromethorphan (PHENERGAN  DM) 6.25-15 mg/5 mL syrp syrup 5 mL, oral, 2 times daily PRN   triamcinolone acetonide (NASACORT AQ) 55 mcg spra spray 1 spray, Each Nostril, Nightly   Vascepa  1 gram cap    Velphoro 500 mg tablet 11 tablets, Every 24 hours   Allergies[1]  ROS  Other systems reviewed and negative. No other complaints.  Objective   BP 136/77 (BP Location: Right arm, Patient Position: Sitting)   Pulse 81   Ht 1.753 m (5' 9)   Wt 114 kg (250 lb 14.4 oz)   SpO2 99%   BMI 37.05 kg/m    Physical Exam: Gen: appears comfortable. HEENT: PERRLA, EOMI,  eardrums and canals normal, clear oropharynx, neck supple  Cardio: Normal rate, regular rhythm. Normal S1/S2. No appreciable murmurs. Resp: Lungs clear to auscultation bilaterally.  MSK:  No gross abnormality or edema. 1+pedal pulses intact. Skin: Warm and dry. No suspicious lesions noted. Neurologic:  No focal deficits. CN 2-12 grossly intact.  Patellar DTRs difficult to elicit b/l.   Psych: Answers  questions appropriately, cooperative. Appropriate affect.   Assessment & Plan   No orders of the defined types were placed in this encounter.    Orders Placed This Encounter  Medications   carvedilol  (COREG ) 25 mg tablet    Sig: Take 1 tablet (25 mg total) by mouth in the morning and 1 tablet (25 mg total) in the evening. Take with meals.    Dispense:  180 tablet    Refill:  3   losartan (COZAAR) 25 mg tablet    Sig: Take 2 tablets (50 mg total) by mouth daily.   fremanezumab-vfrm (Ajovy Autoinjector) 225 mg/1.5 mL atIn injection    Sig: Inject 1.5 mL (225 mg total) under the skin every 30 (thirty) days.      Assessment & Plan Annual physical exam Patient has an appointment with a different PCP in a few months so there is no reason for him to return here as he cannot have 2 PCPs.    Testicular hypofunction Unclear if this is actually a problem or not but the patient expressed frustration and fear.  He declined twice a referral to urology to evaluate and discuss the issues further.      Follow up  if needed prior to his appointment with his new PCP. patient verbalized agreement to above plan. was spent reviewing and interpreting prior notes/images/labs, counseling the patient, transmitting prescriptions and arranging follow up.    This note was dictated with voice recognition software. Similar sounding words may be inadvertently transcribed incorrectly.   Avelina Cottie Murray, PA-C       [1] Allergies Allergen Reactions   Sodium Zirconium Cyclosilicate  Shortness Of Breath    Other Reaction(s): Breathing Problems   Pegademase Bovine Other (See Comments)    Other Reaction(s): Other (See Comments), Unknown  Cultural preference  Cultural preference  Cultural preference   Poractant Alfa Other (See Comments)    Other Reaction(s): Other (See Comments), Unknown  Cultural preference  Cultural  preference  Cultural preference   Pork Derived (Porcine)     Muslim   "

## 2024-02-27 NOTE — Telephone Encounter (Signed)
 Cardiac Catheterization scheduled at [facility] for: Thursday February 29, 2024 10:30 AM Arrival time [facility] Main Entrance A at: 8:30 AM  Diet: -Nothing to eat after midnight.  Hydration: -May drink clear liquids until 2 hours before the procedure.  Approved liquids: Water, clear tea, black coffee, fruit juices-non-citric and without pulp,Gatorade, plain Jello/popsicles.  Dialysis -no PO hydration (bottle of water)  Medication instructions: -Usual morning medications can be taken including aspirin  81 mg.  Plan to go home the same day, you will only stay overnight if medically necessary.  You must have responsible adult to drive you home.  Someone must be with you the first 24 hours after you arrive home.  Reviewed procedure instructions with patient. Patient declined Arabic interpreter on phone call-told me he could speak and understand English. Confirmed with patient MWF dialysis schedule.

## 2024-02-29 ENCOUNTER — Encounter (HOSPITAL_COMMUNITY): Admission: RE | Disposition: A | Payer: Self-pay | Source: Home / Self Care | Attending: Cardiology

## 2024-02-29 ENCOUNTER — Encounter (HOSPITAL_COMMUNITY): Payer: Self-pay | Admitting: Cardiology

## 2024-02-29 ENCOUNTER — Ambulatory Visit (HOSPITAL_COMMUNITY)
Admission: RE | Admit: 2024-02-29 | Discharge: 2024-02-29 | Disposition: A | Discharge status: Home / Self Care | Attending: Cardiology | Admitting: Cardiology

## 2024-02-29 ENCOUNTER — Other Ambulatory Visit: Payer: Self-pay

## 2024-02-29 DIAGNOSIS — I5022 Chronic systolic (congestive) heart failure: Secondary | ICD-10-CM | POA: Diagnosis not present

## 2024-02-29 DIAGNOSIS — Z7682 Awaiting organ transplant status: Secondary | ICD-10-CM | POA: Insufficient documentation

## 2024-02-29 DIAGNOSIS — Z905 Acquired absence of kidney: Secondary | ICD-10-CM | POA: Diagnosis not present

## 2024-02-29 DIAGNOSIS — I1311 Hypertensive heart and chronic kidney disease without heart failure, with stage 5 chronic kidney disease, or end stage renal disease: Secondary | ICD-10-CM | POA: Diagnosis not present

## 2024-02-29 DIAGNOSIS — R931 Abnormal findings on diagnostic imaging of heart and coronary circulation: Secondary | ICD-10-CM | POA: Diagnosis present

## 2024-02-29 DIAGNOSIS — N186 End stage renal disease: Secondary | ICD-10-CM | POA: Diagnosis not present

## 2024-02-29 DIAGNOSIS — R9430 Abnormal result of cardiovascular function study, unspecified: Secondary | ICD-10-CM

## 2024-02-29 HISTORY — PX: LEFT HEART CATH AND CORONARY ANGIOGRAPHY: CATH118249

## 2024-02-29 MED ORDER — ASPIRIN 81 MG PO CHEW: 81.0000 mg | CHEWABLE_TABLET | ORAL | Status: DC

## 2024-02-29 MED ORDER — SODIUM CHLORIDE 0.9% FLUSH: 3.0000 mL | Freq: Two times a day (BID) | INTRAVENOUS | Status: DC

## 2024-02-29 MED ORDER — MIDAZOLAM HCL (PF) 2 MG/2ML IJ SOLN
INTRAMUSCULAR | Status: DC | PRN
  Administered 2024-02-29: 2 mg via INTRAVENOUS

## 2024-02-29 MED ORDER — IOHEXOL 350 MG/ML SOLN
INTRAVENOUS | Status: DC | PRN
  Administered 2024-02-29: 35 mL

## 2024-02-29 MED ORDER — HYDRALAZINE HCL 20 MG/ML IJ SOLN: 10.0000 mg | INTRAMUSCULAR | Status: DC | PRN

## 2024-02-29 MED ORDER — MIDAZOLAM HCL 2 MG/2ML IJ SOLN
INTRAMUSCULAR | Status: AC
  Filled 2024-02-29: qty 2

## 2024-02-29 MED ORDER — SODIUM CHLORIDE 0.9% FLUSH: 3.0000 mL | INTRAVENOUS | Status: DC | PRN

## 2024-02-29 MED ORDER — SODIUM CHLORIDE 0.9 % IV SOLN: 250.0000 mL | INTRAVENOUS | Status: DC | PRN

## 2024-02-29 MED ORDER — FENTANYL CITRATE (PF) 100 MCG/2ML IJ SOLN
INTRAMUSCULAR | Status: AC
  Filled 2024-02-29: qty 2

## 2024-02-29 MED ORDER — LIDOCAINE HCL (PF) 1 % IJ SOLN
INTRAMUSCULAR | Status: AC
  Filled 2024-02-29: qty 30

## 2024-02-29 MED ORDER — LIDOCAINE HCL (PF) 1 % IJ SOLN
INTRAMUSCULAR | Status: DC | PRN
  Administered 2024-02-29: 10 mL via INTRADERMAL

## 2024-02-29 MED ORDER — VERAPAMIL HCL 2.5 MG/ML IV SOLN
INTRAVENOUS | Status: AC
  Filled 2024-02-29: qty 2

## 2024-02-29 MED ORDER — SODIUM CHLORIDE 0.9 % IV SOLN
INTRAVENOUS | Status: DC | PRN
  Administered 2024-02-29: 1000 mL via INTRAVENOUS

## 2024-02-29 MED ORDER — FENTANYL CITRATE (PF) 100 MCG/2ML IJ SOLN
INTRAMUSCULAR | Status: DC | PRN
  Administered 2024-02-29: 25 ug via INTRAVENOUS

## 2024-02-29 MED ORDER — LABETALOL HCL 5 MG/ML IV SOLN: 10.0000 mg | INTRAVENOUS | Status: DC | PRN

## 2024-02-29 MED ORDER — ONDANSETRON HCL 4 MG/2ML IJ SOLN: 4.0000 mg | Freq: Four times a day (QID) | INTRAMUSCULAR | Status: DC | PRN

## 2024-02-29 MED ORDER — ACETAMINOPHEN 325 MG PO TABS: 650.0000 mg | ORAL_TABLET | ORAL | Status: DC | PRN

## 2024-02-29 MED ORDER — HEPARIN SODIUM (PORCINE) 1000 UNIT/ML IJ SOLN
INTRAMUSCULAR | Status: AC
  Filled 2024-02-29: qty 10

## 2024-02-29 NOTE — Progress Notes (Signed)
Client up and walked and tolerated well; right groin stable, no bleeding or hematoma 

## 2024-02-29 NOTE — Interval H&P Note (Signed)
 History and Physical Interval Note:  02/29/2024 10:33 AM  Bernie Gardener  has presented today for surgery, with the diagnosis of pre-transplant. Abnormal stress test..  The various methods of treatment have been discussed with the patient and family. After consideration of risks, benefits and other options for treatment, the patient has consented to  Procedures: LEFT HEART CATH AND CORONARY ANGIOGRAPHY (N/A) as a surgical intervention.  The patient's history has been reviewed, patient examined, no change in status, stable for surgery.  I have reviewed the patient's chart and labs.  Questions were answered to the patient's satisfaction.   Cath Lab Visit (complete for each Cath Lab visit)  Clinical Evaluation Leading to the Procedure:   ACS: No.  Non-ACS:    Anginal Classification: CCS I  Anti-ischemic medical therapy: Minimal Therapy (1 class of medications)  Non-Invasive Test Results: Intermediate-risk stress test findings: cardiac mortality 1-3%/year  Prior CABG: No previous CABG        [facility] 02/29/2024 10:33 AM

## 2024-03-08 ENCOUNTER — Ambulatory Visit (HOSPITAL_COMMUNITY)

## 2024-03-12 ENCOUNTER — Encounter (HOSPITAL_COMMUNITY): Payer: Self-pay

## 2024-03-14 ENCOUNTER — Other Ambulatory Visit: Payer: Self-pay

## 2024-03-14 ENCOUNTER — Encounter (HOSPITAL_COMMUNITY): Payer: Self-pay

## 2024-03-14 ENCOUNTER — Emergency Department (HOSPITAL_COMMUNITY)
Admission: EM | Admit: 2024-03-14 | Discharge: 2024-03-15 | Disposition: A | Discharge status: Home / Self Care | Attending: Emergency Medicine | Admitting: Emergency Medicine

## 2024-03-14 ENCOUNTER — Encounter (HOSPITAL_COMMUNITY): Admission: RE | Disposition: A | Payer: Self-pay | Source: Home / Self Care | Attending: Vascular Surgery

## 2024-03-14 ENCOUNTER — Emergency Department (HOSPITAL_COMMUNITY)

## 2024-03-14 ENCOUNTER — Ambulatory Visit (HOSPITAL_COMMUNITY)
Admission: RE | Admit: 2024-03-14 | Discharge: 2024-03-14 | Disposition: A | Discharge status: Home / Self Care | Attending: Vascular Surgery | Admitting: Vascular Surgery

## 2024-03-14 ENCOUNTER — Ambulatory Visit (HOSPITAL_COMMUNITY): Admission: EM | Admit: 2024-03-14 | Discharge: 2024-03-14 | Disposition: A | Discharge status: Home / Self Care

## 2024-03-14 DIAGNOSIS — R042 Hemoptysis: Secondary | ICD-10-CM

## 2024-03-14 DIAGNOSIS — J40 Bronchitis, not specified as acute or chronic: Secondary | ICD-10-CM

## 2024-03-14 LAB — CBC WITH DIFFERENTIAL/PLATELET
Abs Immature Granulocytes: 0.01 10*3/uL (ref 0.00–0.07)
Basophils Absolute: 0 10*3/uL (ref 0.0–0.1)
Basophils Relative: 1 %
Eosinophils Absolute: 0.1 10*3/uL (ref 0.0–0.5)
Eosinophils Relative: 2 %
HCT: 32.6 % — ABNORMAL LOW (ref 39.0–52.0)
Hemoglobin: 10.3 g/dL — ABNORMAL LOW (ref 13.0–17.0)
Immature Granulocytes: 0 %
Lymphocytes Relative: 22 %
Lymphs Abs: 1.2 10*3/uL (ref 0.7–4.0)
MCH: 28.9 pg (ref 26.0–34.0)
MCHC: 31.6 g/dL (ref 30.0–36.0)
MCV: 91.3 fL (ref 80.0–100.0)
Monocytes Absolute: 0.6 10*3/uL (ref 0.1–1.0)
Monocytes Relative: 11 %
Neutro Abs: 3.5 10*3/uL (ref 1.7–7.7)
Neutrophils Relative %: 64 %
Platelets: 198 10*3/uL (ref 150–400)
RBC: 3.57 MIL/uL — ABNORMAL LOW (ref 4.22–5.81)
RDW: 14.6 % (ref 11.5–15.5)
WBC: 5.5 10*3/uL (ref 4.0–10.5)
nRBC: 0 % (ref 0.0–0.2)

## 2024-03-14 LAB — COMPREHENSIVE METABOLIC PANEL WITH GFR
ALT: 6 U/L (ref 0–44)
AST: 11 U/L — ABNORMAL LOW (ref 15–41)
Albumin: 4.3 g/dL (ref 3.5–5.0)
Alkaline Phosphatase: 76 U/L (ref 38–126)
Anion gap: 18 — ABNORMAL HIGH (ref 5–15)
BUN: 55 mg/dL — ABNORMAL HIGH (ref 6–20)
CO2: 24 mmol/L (ref 22–32)
Calcium: 7.6 mg/dL — ABNORMAL LOW (ref 8.9–10.3)
Chloride: 97 mmol/L — ABNORMAL LOW (ref 98–111)
Creatinine, Ser: 10.4 mg/dL — ABNORMAL HIGH (ref 0.61–1.24)
GFR, Estimated: 5 mL/min — ABNORMAL LOW
Glucose, Bld: 89 mg/dL (ref 70–99)
Potassium: 5.3 mmol/L — ABNORMAL HIGH (ref 3.5–5.1)
Sodium: 139 mmol/L (ref 135–145)
Total Bilirubin: 0.4 mg/dL (ref 0.0–1.2)
Total Protein: 7.2 g/dL (ref 6.5–8.1)

## 2024-03-14 LAB — PROTIME-INR
INR: 1 (ref 0.8–1.2)
Prothrombin Time: 13.3 s (ref 11.4–15.2)

## 2024-03-14 LAB — D-DIMER, QUANTITATIVE: D-Dimer, Quant: <0.27 ug{FEU}/mL (ref 0.00–0.50)

## 2024-03-14 LAB — TROPONIN T, HIGH SENSITIVITY
Troponin T High Sensitivity: 70 ng/L — ABNORMAL HIGH (ref 0–19)
Troponin T High Sensitivity: 63 ng/L — ABNORMAL HIGH (ref 0–19)

## 2024-03-14 NOTE — ED Triage Notes (Addendum)
 UC Note:   Pt presents with hemoptysis and nasal bleeding starting yesterday after dialysis. Also reports dizziness, HA, weakness. No other pain.      Bleeding controlled at time of check in

## 2024-03-14 NOTE — ED Provider Notes (Signed)
 " [place] EMERGENCY DEPARTMENT AT [facility] Provider Note   CSN: [id number] Arrival date & time: 03/14/24  1123     History {Add pertinent medical, surgical, social history, OB history to HPI:1} Chief Complaint  Patient presents with   Hemoptysis   Epistaxis    [name] is a 59 y.o. male with PMH as listed below who presents with missed his dialysis appointment on Monday due to the weather and when he received dialysis on Wednesday afterwards he felt very weak, had episodes of low blood pressure, experienced nausea and vomiting that has continued into today.  Patient states that last night he started throwing up clumps of blood and his nose has been bleeding.  Patient states that he still feels weak and nauseous.  He endorses mild shortness of breath however denies chest pain.    Past Medical History:  Diagnosis Date   Anemia    Back pain    CKD (chronic kidney disease), stage III (HCC)    Stage 4   Colon polyps    adenomatous   Elevated cholesterol    History of echocardiogram    Echo 11/17: EF 65-70, normal wall motion, grade 1 diastolic dysfunction, trivial MR, mild LAE, mild TR   Hyperlipidemia    Hypertension    no current bp meds for last 3 months   Kidney stones 2007   Medication intolerance    a. multiple with prior nonadherence to regimen.   Nausea     in the morning since having the kidney problem    Otosclerosis of both ears    Pneumonia    PONV (postoperative nausea and vomiting)    PUD (peptic ulcer disease)    Has had unspecified surgery for this   Sleep apnea        Home Medications Prior to Admission medications  Medication Sig Start Date End Date Taking? Authorizing Provider  acetaminophen  (TYLENOL ) 325 MG tablet Take 325 mg by mouth as needed.    [provider]  amLODipine  (NORVASC ) 5 MG tablet Take 1 tablet (5 mg total) by mouth daily. 04/18/19 02/29/24  Ezenduka, Nkeiruka J, MD  aspirin  81 MG chewable tablet Chew 81 mg  by mouth daily.    [provider]  atorvastatin  (LIPITOR) 40 MG tablet Take 1 tablet (40 mg total) by mouth daily. 04/24/23 02/29/24  Hilty, Vinie BROCKS, MD  B Complex-C-Zn-Folic Acid  (DIALYVITE 800 WITH ZINC) 0.8 MG TABS Take 1 tablet by mouth daily. 06/22/20   [provider]  benzoyl peroxide  (BENZOYL PEROXIDE ) 5 % external liquid Apply topically 2 (two) times daily. 12/30/19   Purcell Emil Schanz, MD  Betamethasone  Sodium Phosphate  6 MG/ML SOLN INJECT 2:4 SYRINGE BILATERALLY INTO [facility] KNEE JOINT 06/21/19   [provider]  calcium  acetate (PHOSLO ) 667 MG capsule Take 2 capsules (1,334 mg total) by mouth 3 (three) times daily with meals. 12/30/19   Purcell Emil Schanz, MD  carvedilol  (COREG ) 25 MG tablet Take 1 tablet (25 mg total) by mouth 2 (two) times daily with a meal. 04/17/19 02/29/24  Ezenduka, Nkeiruka J, MD  hydrALAZINE  (APRESOLINE ) 25 MG tablet Take 1 tablet (25 mg total) by mouth every 8 (eight) hours. 04/17/19 02/29/24  Ezenduka, Nkeiruka J, MD  lidocaine -prilocaine (EMLA) cream Apply topically. 06/11/19   [provider]  losartan (COZAAR) 50 MG tablet Take 50 mg by mouth daily. 01/26/24   [provider]  pantoprazole  (PROTONIX ) 40 MG tablet Take 1 tablet (40 mg total) by  mouth daily. 04/17/19   Ezenduka, Nkeiruka J, MD  VASCEPA  1 g capsule Take 2 capsules (2 g total) by mouth 2 (two) times daily. 04/24/23   Hilty, Vinie BROCKS, MD      Allergies    Lokelma  [sodium zirconium cyclosilicate ], Bovine (beef) protein-containing drug products, Pegademase bovine, Poractant alfa, and Porcine (pork) protein-containing drug products    Review of Systems   Review of Systems A 10 point review of systems was performed and is negative unless otherwise reported in HPI.  Physical Exam Updated Vital Signs BP 103/68   Pulse (!) 119   Temp 98.2 F (36.8 C)   Resp (!) 22   Ht 5' 10 (1.778 m)   Wt 111 kg   SpO2 100%   BMI 35.11 kg/m  Physical Exam General:  Normal appearing {Desc; male/male:11659}, lying in bed.  HEENT: PERRLA, Sclera anicteric, MMM, trachea midline.  Cardiology: RRR, no murmurs/rubs/gallops. BL radial and DP pulses equal bilaterally.  Resp: Normal respiratory rate and effort. CTAB, no wheezes, rhonchi, crackles.  Abd: Soft, non-tender, non-distended. No rebound tenderness or guarding.  GU: Deferred. MSK: No peripheral edema or signs of trauma. Extremities without deformity or TTP. No cyanosis or clubbing. Skin: warm, dry. No rashes or lesions. Back: No CVA tenderness Neuro: A&Ox4, CNs II-XII grossly intact. MAEs. Sensation grossly intact.  Psych: Normal mood and affect.   ED Results / Procedures / Treatments   Labs (all labs ordered are listed, but only abnormal results are displayed) Labs Reviewed  CBC WITH DIFFERENTIAL/PLATELET - Abnormal; Notable for the following components:      Result Value   RBC 3.57 (*)    Hemoglobin 10.3 (*)    HCT 32.6 (*)    All other components within normal limits  COMPREHENSIVE METABOLIC PANEL WITH GFR - Abnormal; Notable for the following components:   Potassium 5.3 (*)    Chloride 97 (*)    BUN 55 (*)    Creatinine, Ser 10.40 (*)    Calcium  7.6 (*)    AST 11 (*)    GFR, Estimated 5 (*)    Anion gap 18 (*)    All other components within normal limits  D-DIMER, QUANTITATIVE    EKG None  Radiology DG Chest 2 View Result Date: 03/14/2024 CLINICAL DATA:  Cough. EXAM: DG CHEST 2V COMPARISON:  Chest radiograph dated 11/10/2023. FINDINGS: Left lung base atelectasis. No focal consolidation, pleural effusion or pneumothorax. Stable cardiac silhouette. No acute osseous pathology. IMPRESSION: No active cardiopulmonary disease. Electronically Signed   By: Vanetta Chou M.D.   On: 03/14/2024 15:12    Procedures Procedures  {Document cardiac monitor, telemetry assessment procedure when appropriate:1}  Medications Ordered in ED Medications - No data to display  ED Course/ Medical  Decision Making/ A&P                          Medical Decision Making Amount and/or Complexity of Data Reviewed Labs: ordered. Decision-making details documented in ED Course. Radiology: ordered. Decision-making details documented in ED Course.  Risk Prescription drug management.    This patient presents to the ED for concern of ***, this involves an extensive number of treatment options, and is a complaint that carries with it a high risk of complications and morbidity.  I considered the following differential and admission for this acute, potentially life threatening condition.   MDM:    ***  No e/o PNA, pulm edema, PE, pleural effusion. Possible  he has lower respiratory infection or bronchitis. No leukocytosis or fever. Could also consider URI and sleep apnea based on his description of waking up in the night SOB. No pulm edema or increased LEE to indicate HF exacerbation or fluid overload related to missed dialysis sessions.   Clinical Course as of 03/15/24 0026  Thu Mar 14, 2024  1950 Hemoglobin(!): 10.3 Down from 11.9 three weeks ago [HN]  1950 Potassium(!): 5.3 [HN]  1950 D-Dimer, Quant: <0.27 [HN]  2201 Troponin T High Sensitivity(!): 70 Mildly elevated, awaiting repeat [HN]  Fri Mar 15, 2024  0024 CT Angio Chest PE W and/or Wo Contrast 1. No evidence of pulmonary embolism. [HN]  0024 Resp panel by RT-PCR (RSV, Flu A&B, Covid) Anterior Nasal Swab negative [HN]    Clinical Course User Index [HN] Franklyn Sid SAILOR, MD    Labs: I Ordered, and personally interpreted labs.  The pertinent results include:  those listed above  Imaging Studies ordered: I ordered imaging studies including CXR, CT PE I independently visualized and interpreted imaging. I agree with the radiologist interpretation  Additional history obtained from chart review.    Cardiac Monitoring: The patient was maintained on a cardiac monitor.  I personally viewed and interpreted the cardiac monitored  which showed an underlying rhythm of: NSR  Reevaluation: After the interventions noted above, I reevaluated the patient and found that they have :stayed the same  Social Determinants of Health: Lives independently  Disposition:  DC w/ discharge instructions/return precautions. All questions answered to patient's satisfaction.    Co morbidities that complicate the patient evaluation  Past Medical History:  Diagnosis Date   Anemia    Back pain    CKD (chronic kidney disease), stage III (HCC)    Stage 4   Colon polyps    adenomatous   Elevated cholesterol    History of echocardiogram    Echo 11/17: EF 65-70, normal wall motion, grade 1 diastolic dysfunction, trivial MR, mild LAE, mild TR   Hyperlipidemia    Hypertension    no current bp meds for last 3 months   Kidney stones 2007   Medication intolerance    a. multiple with prior nonadherence to regimen.   Nausea     in the morning since having the kidney problem    Otosclerosis of both ears    Pneumonia    PONV (postoperative nausea and vomiting)    PUD (peptic ulcer disease)    Has had unspecified surgery for this   Sleep apnea      Medicines No orders of the defined types were placed in this encounter.   I have reviewed the patients home medicines and have made adjustments as needed  Problem List / ED Course: Problem List Items Addressed This Visit   None        {Document critical care time when appropriate:1} {Document review of labs and clinical decision tools ie heart score, Chads2Vasc2 etc:1}  {Document your independent review of radiology images, and any outside records:1} {Document your discussion with family members, caretakers, and with consultants:1} {Document social determinants of health affecting pt's care:1} {Document your decision making why or why not admission, treatments were needed:1}  This note was created using dictation software, which may contain spelling or grammatical errors.  "

## 2024-03-14 NOTE — Progress Notes (Signed)
" °  °  Patient presented for left upper extremity fistulogram for decreased flows while on dialysis.  On presentation he complains of bleeding from his upper respiratory tract over the past several days and having missed dialysis since last week due to weather initially and because of the bleeding.  He also complains of fevers and general malaise over the past week.  His fistula does have pulsatility however appears to have strong flow to continue dialysis for now and we will plan to reschedule today's procedure and have him evaluated by either primary care doctor or urgent care.  This was discussed with the patient via interpreter and his family at bedside.  [place], MD "

## 2024-03-14 NOTE — ED Provider Triage Note (Signed)
 Emergency Medicine Provider Triage Evaluation Note  Brylen Wagar , a 59 y.o. male  was evaluated in triage.  Pt complains of hemoptysis for the past 2 days. Patient states that he missed his dialysis appointment on Monday due to the weather and when he received dialysis on Wednesday afterwards he felt very weak, had episodes of low blood pressure, experienced nausea and vomiting that has continued into today.  Patient states that last night he started throwing up clumps of blood and his nose has been bleeding.  Patient states that he still feels weak and nauseous.  He endorses mild shortness of breath however denies chest pain.  Patient is in no acute distress.   Review of Systems  Positive: Nausea, vomiting, weakness, shortness of breath, hemoptysis Negative:   Physical Exam  BP (!) 152/81   Pulse 70   Temp 97.9 F (36.6 C)   Resp 18   Ht 5' 10 (1.778 m)   Wt 111 kg   SpO2 97%   BMI 35.11 kg/m  Gen:   Awake, no distress   Resp:  Normal effort  MSK:   Moves extremities without difficulty  Other:    Medical Decision Making  Medically screening exam initiated at 1:50 PM.  Appropriate orders placed.  Frankey Botting was informed that the remainder of the evaluation will be completed by another provider, this initial triage assessment does not replace that evaluation, and the importance of remaining in the ED until their evaluation is complete.     Willma Duwaine [place], [place] 03/14/24 1352

## 2024-03-14 NOTE — ED Provider Notes (Signed)
 " [facility]    CSN: [id number] Arrival date & time: 03/14/24  9065      History   Chief Complaint Chief Complaint  Patient presents with   Hemoptysis    X 2 days     HPI [name] is a 59 y.o. male.   59 year old male is being seen for complaints of hemoptysis.  He reports hemoptysis and epistaxis starting yesterday after dialysis.  He complains of headache, weakness, shortness of breath.  He was scheduled to have a fistulogram today, but was sent here due to complaints of hemoptysis.  He has pictures of what he is coughing up and pictures of his nosebleeds.  He has bright red blood when he coughs in clinic today.  He denies chest pain.  Explained the limitations of urgent care setting.  Discussed that patient would benefit from further evaluation in the emergency department where he can get stat labs and advanced imaging.  He is with his daughter, they are agreeable to this plan.  The history is provided by the patient.    Past Medical History:  Diagnosis Date   Anemia    Back pain    CKD (chronic kidney disease), stage III (HCC)    Stage 4   Colon polyps    adenomatous   Elevated cholesterol    History of echocardiogram    Echo 11/17: EF 65-70, normal wall motion, grade 1 diastolic dysfunction, trivial MR, mild LAE, mild TR   Hyperlipidemia    Hypertension    no current bp meds for last 3 months   Kidney stones 2007   Medication intolerance    a. multiple with prior nonadherence to regimen.   Nausea     in the morning since having the kidney problem    Otosclerosis of both ears    Pneumonia    PONV (postoperative nausea and vomiting)    PUD (peptic ulcer disease)    Has had unspecified surgery for this   Sleep apnea     Patient Active Problem List   Diagnosis Date Noted   Chronic intractable headache 06/15/2023   Chronic bilateral low back pain without sciatica 12/29/2021   Renovascular hypertension 11/07/2021   Allergy, unspecified, sequela  01/27/2020   Personal history of anaphylaxis 01/27/2020   History of nephrectomy, right 07/23/2019   Obesity (BMI 30-39.9) 07/23/2019   Pre-transplant evaluation for kidney transplant 07/23/2019   Hypertensive heart disease with chronic systolic congestive heart failure (HCC) 07/01/2019   Iron deficiency anemia, unspecified 04/25/2019   Anemia in chronic kidney disease 04/17/2019   End stage renal disease on dialysis (HCC) 04/17/2019   Secondary hyperparathyroidism of renal origin 04/17/2019   DCM (dilated cardiomyopathy) (HCC)    History of colon polyps 03/20/2019   Gastroesophageal reflux disease 03/20/2019   CKD (chronic kidney disease) stage 4, GFR 15-29 ml/min (HCC) 08/25/2018   Testicular hypofunction 09/15/2015   BPH without obstruction/lower urinary tract symptoms 09/15/2015   Bilateral hearing loss 07/03/2015   Mixed hearing loss of right ear 07/03/2015   Dyslipidemia 03/29/2013   Nephrolithiasis 03/14/2013   Lumbar radiculopathy 02/26/2013   Vitamin D  deficiency 08/03/2011   HLD (hyperlipidemia) 04/10/2010    Past Surgical History:  Procedure Laterality Date   A/V FISTULAGRAM N/A 06/22/2023   Procedure: A/V Fistulagram;  Surgeon: Melia Lynwood ORN, MD;  Location: [facility];  Service: Cardiovascular;  Laterality: N/A;   AV FISTULA PLACEMENT Left 01/04/2019   Procedure: ARTERIOVENOUS (AV) FISTULA CREATION LEFT ARM;  Surgeon: Gretta Lonni PARAS, MD;  Location: [facility] OR;  Service: Vascular;  Laterality: Left;   BASCILIC VEIN TRANSPOSITION Left 03/11/2019   Procedure: BASILIC VEIN TRANSPOSITION 2ND STAGE LEFT;  Surgeon: Gretta Lonni PARAS, MD;  Location: [facility] OR;  Service: Vascular;  Laterality: Left;   LEFT HEART CATH AND CORONARY ANGIOGRAPHY N/A 02/29/2024   Procedure: LEFT HEART CATH AND CORONARY ANGIOGRAPHY;  Surgeon: Jordan, Peter M, MD;  Location: [facility] INVASIVE CV LAB;  Service: Cardiovascular;  Laterality: N/A;   NEPHRECTOMY  02/18/2011   Procedure: NEPHRECTOMY;  Surgeon:  Thomasine Oiler, MD;  Location: [facility] ORS;  Service: Urology;  Laterality: Right;   SMALL INTESTINE SURGERY     STAPEDOTOMY  2005   lt ear jan, right ear sept   surgery for ulcers  1990       Home Medications    Prior to Admission medications  Medication Sig Start Date End Date Taking? Authorizing Provider  acetaminophen  (TYLENOL ) 325 MG tablet Take 325 mg by mouth as needed.    [provider]  amLODipine  (NORVASC ) 5 MG tablet Take 1 tablet (5 mg total) by mouth daily. 04/18/19 02/29/24  Ezenduka, Nkeiruka J, MD  aspirin  81 MG chewable tablet Chew 81 mg by mouth daily.    [provider]  atorvastatin  (LIPITOR) 40 MG tablet Take 1 tablet (40 mg total) by mouth daily. 04/24/23 02/29/24  Hilty, Vinie BROCKS, MD  B Complex-C-Zn-Folic Acid  (DIALYVITE 800 WITH ZINC) 0.8 MG TABS Take 1 tablet by mouth daily. 06/22/20   [provider]  benzoyl peroxide  (BENZOYL PEROXIDE ) 5 % external liquid Apply topically 2 (two) times daily. 12/30/19   Purcell Emil Schanz, MD  Betamethasone  Sodium Phosphate  6 MG/ML SOLN INJECT 2:4 SYRINGE BILATERALLY INTO [facility] KNEE JOINT 06/21/19   [provider]  calcium  acetate (PHOSLO ) 667 MG capsule Take 2 capsules (1,334 mg total) by mouth 3 (three) times daily with meals. 12/30/19   Purcell Emil Schanz, MD  carvedilol  (COREG ) 25 MG tablet Take 1 tablet (25 mg total) by mouth 2 (two) times daily with a meal. 04/17/19 02/29/24  Ezenduka, Nkeiruka J, MD  hydrALAZINE  (APRESOLINE ) 25 MG tablet Take 1 tablet (25 mg total) by mouth every 8 (eight) hours. 04/17/19 02/29/24  Ezenduka, Nkeiruka J, MD  lidocaine -prilocaine (EMLA) cream Apply topically. 06/11/19   [provider]  losartan (COZAAR) 50 MG tablet Take 50 mg by mouth daily. 01/26/24   [provider]  pantoprazole  (PROTONIX ) 40 MG tablet Take 1 tablet (40 mg total) by mouth daily. 04/17/19   Ezenduka, Nkeiruka J, MD  VASCEPA  1 g capsule Take 2 capsules (2 g total) by mouth 2 (two)  times daily. 04/24/23   Hilty, Vinie BROCKS, MD    Family History Family History  Problem Relation Age of Onset   Hyperlipidemia Father    Heart disease Mother    Hypertension Mother    Hypertension Sister    Heart disease Brother 65       CABG   Hyperlipidemia Sister    Hyperlipidemia Sister    Esophageal cancer Cousin    Healthy Child    Colon cancer Neg Hx    Stomach cancer Neg Hx    Rectal cancer Neg Hx     Social History Social History[1]   Allergies   Lokelma  [sodium zirconium cyclosilicate ], Bovine (beef) protein-containing drug products, Pegademase bovine, Poractant alfa, and Porcine (pork) protein-containing drug products   Review of Systems Review of Systems  HENT:  Positive for nosebleeds.  Respiratory:  Positive for shortness of breath. Negative for cough.   Cardiovascular:  Negative for chest pain.  Skin:  Negative for color change.  Neurological:  Positive for weakness and headaches. Negative for dizziness.     Physical Exam Triage Vital Signs ED Triage Vitals [03/14/24 1105]  Encounter Vitals Group     BP (!) 156/84     Girls Systolic BP Percentile      Girls Diastolic BP Percentile      Boys Systolic BP Percentile      Boys Diastolic BP Percentile      Pulse Rate 74     Resp 20     Temp 98.4 F (36.9 C)     Temp Source Oral     SpO2 94 %     Weight      Height      Head Circumference      Peak Flow      Pain Score      Pain Loc      Pain Education      Exclude from Growth Chart    No data found.  Updated Vital Signs BP (!) 156/84 (BP Location: Right Arm)   Pulse 74   Temp 98.4 F (36.9 C) (Oral)   Resp 20   SpO2 94%   Visual Acuity Right Eye Distance:   Left Eye Distance:   Bilateral Distance:    Right Eye Near:   Left Eye Near:    Bilateral Near:     Physical Exam Vitals and nursing note reviewed.  Constitutional:      General: He is not in acute distress.    Appearance: He is well-developed. He is not  toxic-appearing.     Comments: Pleasant male appearing stated age found sitting in chair in no acute distress.  HENT:     Head: Normocephalic and atraumatic.     Mouth/Throat:     Lips: Pink.     Mouth: Mucous membranes are moist.  Cardiovascular:     Rate and Rhythm: Normal rate and regular rhythm.     Heart sounds: Normal heart sounds. No murmur heard. Pulmonary:     Effort: Pulmonary effort is normal. No respiratory distress.     Breath sounds: Normal breath sounds.  Skin:    General: Skin is warm and dry.     Capillary Refill: Capillary refill takes less than 2 seconds.  Neurological:     Mental Status: He is alert and oriented to person, place, and time.  Psychiatric:        Mood and Affect: Mood normal.      UC Treatments / Results  Labs (all labs ordered are listed, but only abnormal results are displayed) Labs Reviewed - No data to display  EKG   Radiology No results found.  Procedures Procedures (including critical care time)  Medications Ordered in UC Medications - No data to display  Initial Impression / Assessment and Plan / UC Course  I have reviewed the triage vital signs and the nursing notes.  Pertinent labs & imaging results that were available during my care of the patient were reviewed by me and considered in my medical decision making (see chart for details).     Vitals and triage reviewed, patient is hemodynamically stable.  He presents with hemoptysis onset yesterday afternoon.  He continues to cough up bright red blood.  Discussed limitations of urgent care setting.  He is advised to go to the emergency department for  further evaluation including stat labs and advanced imaging.  He is agreeable to this plan.  He is with his daughter who is agreeable to this plan.  He is stable for private transport. Final Clinical Impressions(s) / UC Diagnoses   Final diagnoses:  None   Discharge Instructions   None    ED Prescriptions   None     PDMP not reviewed this encounter.    [1]  Social History Tobacco Use   Smoking status: Never   Smokeless tobacco: Never  Vaping Use   Vaping status: Never Used  Substance Use Topics   Alcohol use: No   Drug use: No     Lennice Jon BROCKS, FNP 03/14/24 1124  "

## 2024-03-14 NOTE — ED Triage Notes (Signed)
 Pt presents with hemoptysis and nasal bleeding starting yesterday after dialysis. Also reports dizziness, HA, weakness. No other pain.  Daughter with patient.

## 2024-03-14 NOTE — Discharge Instructions (Addendum)
 Requires follow-up care with resources not available at urgent care.

## 2024-03-15 ENCOUNTER — Emergency Department (HOSPITAL_COMMUNITY)

## 2024-03-15 LAB — RESP PANEL BY RT-PCR (RSV, FLU A&B, COVID)  RVPGX2
Influenza A by PCR: NEGATIVE
Influenza B by PCR: NEGATIVE
Resp Syncytial Virus by PCR: NEGATIVE
SARS Coronavirus 2 by RT PCR: NEGATIVE

## 2024-03-15 MED ORDER — IOHEXOL 350 MG/ML SOLN
75.0000 mL | Freq: Once | INTRAVENOUS | Status: AC | PRN
  Administered 2024-03-15: 75 mL via INTRAVENOUS

## 2024-03-15 NOTE — ED Notes (Addendum)
 Pts family arrived to pick him up. Pt visualized by RN getting into car with family member. Discharge instructions reviewed with pt and family member. Both verbalized understanding.

## 2024-03-21 ENCOUNTER — Encounter (HOSPITAL_COMMUNITY): Payer: Self-pay | Admitting: Vascular Surgery

## 2024-03-25 ENCOUNTER — Ambulatory Visit: Admitting: Emergency Medicine

## 2024-03-28 ENCOUNTER — Encounter (HOSPITAL_COMMUNITY): Admission: RE | Payer: Self-pay | Source: Home / Self Care

## 2024-03-28 ENCOUNTER — Ambulatory Visit (HOSPITAL_COMMUNITY): Admission: RE | Admit: 2024-03-28 | Admitting: Vascular Surgery

## 2024-05-09 ENCOUNTER — Ambulatory Visit: Admitting: Emergency Medicine
